# Patient Record
Sex: Female | Born: 1970 | Race: White | Hispanic: No | Marital: Married | Smoking: Never smoker
Health system: Southern US, Community
[De-identification: ages and names within clinical notes are randomized; demographics above are authoritative.]

## PROBLEM LIST (undated history)

## (undated) DIAGNOSIS — M7989 Other specified soft tissue disorders: Secondary | ICD-10-CM

## (undated) DIAGNOSIS — E538 Deficiency of other specified B group vitamins: Secondary | ICD-10-CM

## (undated) DIAGNOSIS — F419 Anxiety disorder, unspecified: Secondary | ICD-10-CM

## (undated) DIAGNOSIS — N905 Atrophy of vulva: Secondary | ICD-10-CM

## (undated) DIAGNOSIS — L732 Hidradenitis suppurativa: Secondary | ICD-10-CM

## (undated) DIAGNOSIS — N83209 Unspecified ovarian cyst, unspecified side: Secondary | ICD-10-CM

## (undated) DIAGNOSIS — K5900 Constipation, unspecified: Secondary | ICD-10-CM

## (undated) DIAGNOSIS — K219 Gastro-esophageal reflux disease without esophagitis: Secondary | ICD-10-CM

## (undated) DIAGNOSIS — G4730 Sleep apnea, unspecified: Secondary | ICD-10-CM

## (undated) DIAGNOSIS — I341 Nonrheumatic mitral (valve) prolapse: Secondary | ICD-10-CM

## (undated) DIAGNOSIS — M5136 Other intervertebral disc degeneration, lumbar region: Secondary | ICD-10-CM

## (undated) DIAGNOSIS — I10 Essential (primary) hypertension: Secondary | ICD-10-CM

## (undated) DIAGNOSIS — F329 Major depressive disorder, single episode, unspecified: Secondary | ICD-10-CM

## (undated) DIAGNOSIS — T4145XA Adverse effect of unspecified anesthetic, initial encounter: Secondary | ICD-10-CM

## (undated) DIAGNOSIS — R112 Nausea with vomiting, unspecified: Secondary | ICD-10-CM

## (undated) DIAGNOSIS — N946 Dysmenorrhea, unspecified: Secondary | ICD-10-CM

## (undated) DIAGNOSIS — K5792 Diverticulitis of intestine, part unspecified, without perforation or abscess without bleeding: Secondary | ICD-10-CM

## (undated) DIAGNOSIS — Z9889 Other specified postprocedural states: Secondary | ICD-10-CM

## (undated) DIAGNOSIS — E669 Obesity, unspecified: Secondary | ICD-10-CM

## (undated) DIAGNOSIS — F32A Depression, unspecified: Secondary | ICD-10-CM

## (undated) DIAGNOSIS — S0300XA Dislocation of jaw, unspecified side, initial encounter: Secondary | ICD-10-CM

## (undated) DIAGNOSIS — K589 Irritable bowel syndrome without diarrhea: Secondary | ICD-10-CM

## (undated) DIAGNOSIS — R141 Gas pain: Secondary | ICD-10-CM

## (undated) DIAGNOSIS — F988 Other specified behavioral and emotional disorders with onset usually occurring in childhood and adolescence: Secondary | ICD-10-CM

## (undated) DIAGNOSIS — N952 Postmenopausal atrophic vaginitis: Secondary | ICD-10-CM

## (undated) DIAGNOSIS — M549 Dorsalgia, unspecified: Secondary | ICD-10-CM

## (undated) DIAGNOSIS — M1990 Unspecified osteoarthritis, unspecified site: Secondary | ICD-10-CM

## (undated) DIAGNOSIS — M51369 Other intervertebral disc degeneration, lumbar region without mention of lumbar back pain or lower extremity pain: Secondary | ICD-10-CM

## (undated) DIAGNOSIS — N809 Endometriosis, unspecified: Secondary | ICD-10-CM

## (undated) DIAGNOSIS — E785 Hyperlipidemia, unspecified: Secondary | ICD-10-CM

## (undated) DIAGNOSIS — R0602 Shortness of breath: Secondary | ICD-10-CM

## (undated) DIAGNOSIS — IMO0002 Reserved for concepts with insufficient information to code with codable children: Secondary | ICD-10-CM

## (undated) DIAGNOSIS — G44229 Chronic tension-type headache, not intractable: Secondary | ICD-10-CM

## (undated) DIAGNOSIS — M2550 Pain in unspecified joint: Secondary | ICD-10-CM

## (undated) DIAGNOSIS — T8859XA Other complications of anesthesia, initial encounter: Secondary | ICD-10-CM

## (undated) HISTORY — DX: Depression, unspecified: F32.A

## (undated) HISTORY — DX: Gastro-esophageal reflux disease without esophagitis: K21.9

## (undated) HISTORY — DX: Gas pain: R14.1

## (undated) HISTORY — DX: Nonrheumatic mitral (valve) prolapse: I34.1

## (undated) HISTORY — DX: Hidradenitis suppurativa: L73.2

## (undated) HISTORY — DX: Obesity, unspecified: E66.9

## (undated) HISTORY — DX: Pain in unspecified joint: M25.50

## (undated) HISTORY — DX: Other specified soft tissue disorders: M79.89

## (undated) HISTORY — DX: Postmenopausal atrophic vaginitis: N95.2

## (undated) HISTORY — DX: Unspecified osteoarthritis, unspecified site: M19.90

## (undated) HISTORY — DX: Deficiency of other specified B group vitamins: E53.8

## (undated) HISTORY — DX: Dislocation of jaw, unspecified side, initial encounter: S03.00XA

## (undated) HISTORY — DX: Unspecified ovarian cyst, unspecified side: N83.209

## (undated) HISTORY — DX: Irritable bowel syndrome, unspecified: K58.9

## (undated) HISTORY — DX: Shortness of breath: R06.02

## (undated) HISTORY — DX: Other specified behavioral and emotional disorders with onset usually occurring in childhood and adolescence: F98.8

## (undated) HISTORY — DX: Anxiety disorder, unspecified: F41.9

## (undated) HISTORY — DX: Chronic tension-type headache, not intractable: G44.229

## (undated) HISTORY — DX: Dysmenorrhea, unspecified: N94.6

## (undated) HISTORY — DX: Endometriosis, unspecified: N80.9

## (undated) HISTORY — DX: Essential (primary) hypertension: I10

## (undated) HISTORY — DX: Dorsalgia, unspecified: M54.9

## (undated) HISTORY — PX: TUBAL LIGATION: SHX77

## (undated) HISTORY — PX: OTHER SURGICAL HISTORY: SHX169

## (undated) HISTORY — PX: CYSTOSCOPY: SUR368

## (undated) HISTORY — DX: Constipation, unspecified: K59.00

## (undated) HISTORY — DX: Major depressive disorder, single episode, unspecified: F32.9

## (undated) HISTORY — DX: Atrophy of vulva: N90.5

## (undated) HISTORY — DX: Hyperlipidemia, unspecified: E78.5

## (undated) HISTORY — DX: Reserved for concepts with insufficient information to code with codable children: IMO0002

---
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
## 1998-11-18 HISTORY — PX: TEMPOROMANDIBULAR JOINT SURGERY: SHX35

## 1999-11-19 HISTORY — PX: OVARIAN CYST REMOVAL: SHX89

## 2001-11-18 HISTORY — PX: HERNIA REPAIR: SHX51

## 2002-11-18 HISTORY — PX: BREAST EXCISIONAL BIOPSY: SUR124

## 2004-06-19 ENCOUNTER — Other Ambulatory Visit: Payer: Self-pay

## 2005-12-23 ENCOUNTER — Ambulatory Visit (HOSPITAL_BASED_OUTPATIENT_CLINIC_OR_DEPARTMENT_OTHER): Admission: RE | Admit: 2005-12-23 | Discharge: 2005-12-23 | Payer: Self-pay | Admitting: Urology

## 2006-04-10 ENCOUNTER — Ambulatory Visit: Payer: Self-pay

## 2006-06-11 ENCOUNTER — Ambulatory Visit: Payer: Self-pay

## 2007-03-10 ENCOUNTER — Ambulatory Visit: Payer: Self-pay | Admitting: Obstetrics and Gynecology

## 2007-03-27 ENCOUNTER — Inpatient Hospital Stay: Payer: Self-pay | Admitting: Obstetrics and Gynecology

## 2007-03-27 HISTORY — PX: ABDOMINAL HYSTERECTOMY: SHX81

## 2008-02-26 ENCOUNTER — Emergency Department: Payer: Self-pay | Admitting: Emergency Medicine

## 2008-03-02 ENCOUNTER — Emergency Department: Payer: Self-pay | Admitting: Emergency Medicine

## 2009-02-27 ENCOUNTER — Ambulatory Visit: Payer: Self-pay | Admitting: Internal Medicine

## 2009-04-24 ENCOUNTER — Ambulatory Visit: Payer: Self-pay | Admitting: Internal Medicine

## 2009-07-06 ENCOUNTER — Ambulatory Visit: Payer: Self-pay | Admitting: Internal Medicine

## 2009-08-17 ENCOUNTER — Ambulatory Visit: Payer: Self-pay | Admitting: Unknown Physician Specialty

## 2009-08-29 ENCOUNTER — Ambulatory Visit: Payer: Self-pay | Admitting: Family Medicine

## 2009-09-08 ENCOUNTER — Emergency Department: Payer: Self-pay | Admitting: Emergency Medicine

## 2009-09-26 ENCOUNTER — Ambulatory Visit: Payer: Self-pay | Admitting: General Practice

## 2009-11-07 ENCOUNTER — Emergency Department: Payer: Self-pay | Admitting: Emergency Medicine

## 2010-03-13 ENCOUNTER — Ambulatory Visit: Payer: Self-pay | Admitting: Anesthesiology

## 2010-03-27 ENCOUNTER — Ambulatory Visit: Payer: Self-pay | Admitting: Anesthesiology

## 2010-04-13 ENCOUNTER — Ambulatory Visit: Payer: Self-pay | Admitting: Anesthesiology

## 2010-04-19 ENCOUNTER — Ambulatory Visit: Payer: Self-pay | Admitting: Unknown Physician Specialty

## 2010-05-01 ENCOUNTER — Inpatient Hospital Stay: Payer: Self-pay | Admitting: Unknown Physician Specialty

## 2010-05-18 ENCOUNTER — Ambulatory Visit: Payer: Self-pay | Admitting: Unknown Physician Specialty

## 2010-05-25 ENCOUNTER — Ambulatory Visit: Payer: Self-pay | Admitting: Anesthesiology

## 2010-06-08 ENCOUNTER — Ambulatory Visit: Payer: Self-pay | Admitting: Unknown Physician Specialty

## 2010-06-29 ENCOUNTER — Ambulatory Visit: Payer: Self-pay | Admitting: Anesthesiology

## 2010-07-20 ENCOUNTER — Ambulatory Visit: Payer: Self-pay | Admitting: Anesthesiology

## 2010-07-26 ENCOUNTER — Ambulatory Visit: Payer: Self-pay

## 2010-08-20 ENCOUNTER — Encounter: Payer: Self-pay | Admitting: General Practice

## 2010-08-31 ENCOUNTER — Ambulatory Visit: Payer: Self-pay | Admitting: General Practice

## 2010-09-02 ENCOUNTER — Inpatient Hospital Stay: Payer: Self-pay | Admitting: Internal Medicine

## 2010-09-07 ENCOUNTER — Ambulatory Visit: Payer: Self-pay | Admitting: Anesthesiology

## 2010-09-18 ENCOUNTER — Encounter: Payer: Self-pay | Admitting: General Practice

## 2010-10-03 ENCOUNTER — Ambulatory Visit: Payer: Self-pay | Admitting: Anesthesiology

## 2010-10-18 ENCOUNTER — Encounter: Payer: Self-pay | Admitting: General Practice

## 2010-10-30 ENCOUNTER — Ambulatory Visit: Payer: Self-pay | Admitting: Anesthesiology

## 2010-11-18 ENCOUNTER — Encounter: Payer: Self-pay | Admitting: General Practice

## 2010-11-28 ENCOUNTER — Ambulatory Visit: Payer: Self-pay

## 2010-12-03 ENCOUNTER — Ambulatory Visit: Payer: Self-pay | Admitting: Unknown Physician Specialty

## 2010-12-28 ENCOUNTER — Ambulatory Visit: Payer: Self-pay | Admitting: Anesthesiology

## 2011-03-04 ENCOUNTER — Ambulatory Visit: Payer: Self-pay | Admitting: Anesthesiology

## 2011-04-23 ENCOUNTER — Ambulatory Visit: Payer: Self-pay | Admitting: Anesthesiology

## 2011-06-25 ENCOUNTER — Ambulatory Visit: Payer: Self-pay | Admitting: Anesthesiology

## 2011-09-25 ENCOUNTER — Ambulatory Visit: Payer: Self-pay | Admitting: Anesthesiology

## 2012-03-18 ENCOUNTER — Inpatient Hospital Stay: Payer: Self-pay | Admitting: Internal Medicine

## 2012-03-18 LAB — COMPREHENSIVE METABOLIC PANEL
Anion Gap: 5 — ABNORMAL LOW (ref 7–16)
BUN: 10 mg/dL (ref 7–18)
Bilirubin,Total: 0.3 mg/dL (ref 0.2–1.0)
Chloride: 106 mmol/L (ref 98–107)
Co2: 26 mmol/L (ref 21–32)
EGFR (African American): >60
Potassium: 4.1 mmol/L (ref 3.5–5.1)
SGOT(AST): 26 U/L (ref 15–37)
SGPT (ALT): 37 U/L
Sodium: 137 mmol/L (ref 136–145)
Total Protein: 7.8 g/dL (ref 6.4–8.2)

## 2012-03-18 LAB — URINALYSIS, COMPLETE
RBC,UR: <1 /HPF (ref 0–5)
Squamous Epithelial: 2
WBC UR: <1 /HPF (ref 0–5)

## 2012-03-18 LAB — CBC
MCH: 29.7 pg (ref 26.0–34.0)
MCV: 88 fL (ref 80–100)
Platelet: 270 10*3/uL (ref 150–440)
RBC: 4.77 10*6/uL (ref 3.80–5.20)
RDW: 14.7 % — ABNORMAL HIGH (ref 11.5–14.5)
WBC: 11.2 10*3/uL — ABNORMAL HIGH (ref 3.6–11.0)

## 2012-03-18 LAB — LIPASE, BLOOD: Lipase: 188 U/L (ref 73–393)

## 2012-03-19 LAB — CBC WITH DIFFERENTIAL/PLATELET
Basophil #: 0 10*3/uL (ref 0.0–0.1)
Basophil %: 0.3 %
Eosinophil #: 0.1 10*3/uL (ref 0.0–0.7)
Eosinophil %: 1.1 %
Lymphocyte #: 1.9 10*3/uL (ref 1.0–3.6)
Lymphocyte %: 26.7 %
MCV: 89 fL (ref 80–100)
Monocyte #: 0.5 x10 3/mm (ref 0.2–0.9)
Monocyte %: 7.3 %
Neutrophil %: 64.6 %
Platelet: 214 10*3/uL (ref 150–440)
RBC: 4.07 10*6/uL (ref 3.80–5.20)
RDW: 14.5 % (ref 11.5–14.5)

## 2012-03-19 LAB — COMPREHENSIVE METABOLIC PANEL
Alkaline Phosphatase: 102 U/L (ref 50–136)
BUN: 6 mg/dL — ABNORMAL LOW (ref 7–18)
Bilirubin,Total: 0.6 mg/dL (ref 0.2–1.0)
EGFR (African American): >60
Glucose: 90 mg/dL (ref 65–99)
Osmolality: 275 (ref 275–301)
Potassium: 3.6 mmol/L (ref 3.5–5.1)
SGPT (ALT): 28 U/L
Total Protein: 6.8 g/dL (ref 6.4–8.2)

## 2012-03-19 LAB — LIPID PANEL
Cholesterol: 235 mg/dL — ABNORMAL HIGH (ref 0–200)
HDL Cholesterol: 35 mg/dL — ABNORMAL LOW (ref 40–60)
Ldl Cholesterol, Calc: 148 mg/dL — ABNORMAL HIGH (ref 0–100)
Triglycerides: 258 mg/dL — ABNORMAL HIGH (ref 0–200)

## 2012-03-20 LAB — CBC WITH DIFFERENTIAL/PLATELET
Basophil #: 0 10*3/uL (ref 0.0–0.1)
Eosinophil #: 0.1 10*3/uL (ref 0.0–0.7)
HCT: 36.1 % (ref 35.0–47.0)
Lymphocyte #: 1.5 10*3/uL (ref 1.0–3.6)
Lymphocyte %: 27.9 %
Neutrophil %: 63.2 %
Platelet: 203 10*3/uL (ref 150–440)
RBC: 4.05 10*6/uL (ref 3.80–5.20)
RDW: 14.6 % — ABNORMAL HIGH (ref 11.5–14.5)

## 2012-03-20 LAB — BASIC METABOLIC PANEL
Anion Gap: 6 — ABNORMAL LOW (ref 7–16)
BUN: 4 mg/dL — ABNORMAL LOW (ref 7–18)
Calcium, Total: 8.5 mg/dL (ref 8.5–10.1)
Chloride: 105 mmol/L (ref 98–107)
Co2: 28 mmol/L (ref 21–32)
Creatinine: 0.86 mg/dL (ref 0.60–1.30)
EGFR (African American): >60
EGFR (Non-African Amer.): >60
Osmolality: 273 (ref 275–301)
Potassium: 3.4 mmol/L — ABNORMAL LOW (ref 3.5–5.1)
Sodium: 139 mmol/L (ref 136–145)

## 2012-03-21 LAB — BASIC METABOLIC PANEL
BUN: 3 mg/dL — ABNORMAL LOW (ref 7–18)
Chloride: 109 mmol/L — ABNORMAL HIGH (ref 98–107)
Creatinine: 0.76 mg/dL (ref 0.60–1.30)
EGFR (African American): >60
Glucose: 95 mg/dL (ref 65–99)
Osmolality: 283 (ref 275–301)
Potassium: 3.4 mmol/L — ABNORMAL LOW (ref 3.5–5.1)

## 2012-03-21 LAB — CBC WITH DIFFERENTIAL/PLATELET
Basophil #: 0 10*3/uL (ref 0.0–0.1)
Basophil %: 0.4 %
Eosinophil %: 3.1 %
HCT: 33.7 % — ABNORMAL LOW (ref 35.0–47.0)
Lymphocyte #: 1.2 10*3/uL (ref 1.0–3.6)
Lymphocyte %: 27.9 %
MCHC: 33.9 g/dL (ref 32.0–36.0)
MCV: 89 fL (ref 80–100)
Monocyte %: 7.9 %
Neutrophil #: 2.5 10*3/uL (ref 1.4–6.5)

## 2012-04-01 ENCOUNTER — Ambulatory Visit: Payer: Self-pay | Admitting: Physical Medicine and Rehabilitation

## 2012-04-02 ENCOUNTER — Ambulatory Visit: Payer: Self-pay | Admitting: Gastroenterology

## 2012-04-08 LAB — HEPATIC FUNCTION PANEL
Alkaline Phosphatase: 88 U/L (ref 25–125)
Bilirubin, Direct: 0.1 mg/dL

## 2012-04-08 LAB — LIPID PANEL
HDL: 38 mg/dL (ref 35–70)
LDl/HDL Ratio: 7.8
Triglycerides: 230 mg/dL — AB (ref 40–160)

## 2012-04-08 LAB — BASIC METABOLIC PANEL
BUN: 10 mg/dL (ref 4–21)
Sodium: 139 mmol/L (ref 137–147)

## 2012-04-08 LAB — CBC AND DIFFERENTIAL
Hemoglobin: 13.3 g/dL (ref 12.0–16.0)
WBC: 4.5 10^3/mL

## 2012-05-13 ENCOUNTER — Ambulatory Visit: Payer: Self-pay | Admitting: Physical Medicine and Rehabilitation

## 2012-07-14 ENCOUNTER — Encounter: Payer: Self-pay | Admitting: Physical Medicine and Rehabilitation

## 2012-12-08 ENCOUNTER — Encounter: Payer: Self-pay | Admitting: Internal Medicine

## 2012-12-08 ENCOUNTER — Encounter: Payer: Self-pay | Admitting: *Deleted

## 2012-12-08 ENCOUNTER — Ambulatory Visit (INDEPENDENT_AMBULATORY_CARE_PROVIDER_SITE_OTHER): Payer: Self-pay | Admitting: Internal Medicine

## 2012-12-08 VITALS — BP 130/78 | HR 82 | Temp 98.9°F | Ht 70.5 in | Wt 280.0 lb

## 2012-12-08 DIAGNOSIS — R5381 Other malaise: Secondary | ICD-10-CM

## 2012-12-08 DIAGNOSIS — G8929 Other chronic pain: Secondary | ICD-10-CM

## 2012-12-08 DIAGNOSIS — G4730 Sleep apnea, unspecified: Secondary | ICD-10-CM

## 2012-12-08 DIAGNOSIS — M549 Dorsalgia, unspecified: Secondary | ICD-10-CM

## 2012-12-08 DIAGNOSIS — E7800 Pure hypercholesterolemia, unspecified: Secondary | ICD-10-CM

## 2012-12-08 DIAGNOSIS — M79609 Pain in unspecified limb: Secondary | ICD-10-CM

## 2012-12-08 DIAGNOSIS — M79606 Pain in leg, unspecified: Secondary | ICD-10-CM

## 2012-12-08 DIAGNOSIS — I341 Nonrheumatic mitral (valve) prolapse: Secondary | ICD-10-CM

## 2012-12-08 DIAGNOSIS — R5383 Other fatigue: Secondary | ICD-10-CM

## 2012-12-08 DIAGNOSIS — R51 Headache: Secondary | ICD-10-CM

## 2012-12-08 DIAGNOSIS — I059 Rheumatic mitral valve disease, unspecified: Secondary | ICD-10-CM

## 2012-12-08 MED ORDER — ESTRADIOL 1 MG PO TABS: 1.0000 mg | ORAL_TABLET | Freq: Every day | ORAL | Status: DC

## 2012-12-13 ENCOUNTER — Encounter: Payer: Self-pay | Admitting: Internal Medicine

## 2012-12-13 DIAGNOSIS — G4730 Sleep apnea, unspecified: Secondary | ICD-10-CM | POA: Insufficient documentation

## 2012-12-13 DIAGNOSIS — I341 Nonrheumatic mitral (valve) prolapse: Secondary | ICD-10-CM | POA: Insufficient documentation

## 2012-12-13 DIAGNOSIS — M549 Dorsalgia, unspecified: Secondary | ICD-10-CM | POA: Insufficient documentation

## 2012-12-13 DIAGNOSIS — E7800 Pure hypercholesterolemia, unspecified: Secondary | ICD-10-CM | POA: Insufficient documentation

## 2012-12-13 DIAGNOSIS — G8929 Other chronic pain: Secondary | ICD-10-CM

## 2012-12-13 HISTORY — DX: Other chronic pain: G89.29

## 2012-12-13 NOTE — Assessment & Plan Note (Signed)
See above for results.  Never had the CPAP titration.  Encouraged her to schedule.  She is not able at this time.  Instructed to avoid sleeping supine and avoid sedating medications.

## 2012-12-13 NOTE — Assessment & Plan Note (Signed)
Saw Dr Juel Burrow.  Had ECHO - MVP.  Currently doing well.  Follow.

## 2012-12-13 NOTE — Assessment & Plan Note (Signed)
Has a documented history of chronic headaches.  Has a history of a chiari malformation.  Is s/p chiari decompression.  Followed by neurosurgery.  MRI 01/01/11.  Declines any further testing at this time.  Follow.

## 2012-12-13 NOTE — Assessment & Plan Note (Signed)
Not taking her medication regularly.  Not watching what she eats.  Check lipid panel.

## 2012-12-13 NOTE — Assessment & Plan Note (Signed)
Being followed by pain clinic.  Gets her pain meds through them.  Undergoing further w/up.  See above.

## 2012-12-13 NOTE — Progress Notes (Signed)
Subjective:    Patient ID: Kathleen Everett, female    DOB: 05-05-1971, 42 y.o.   MRN: [id number]  HPI 42 year old female with past history of allergy problems, bladder ulcers with previous urological w/up requiring pubovaginal sling, depression, hypercholesterolemia and s/p chiari decompression.  She comes in today for a scheduled follow up.  She was diagnosed with sleep apnea.  Had both obstructive and central sleep apnea.  Had a redo test off pain medication and the central apnea resolved.  She never followed through with the CPAP titration.  States she just can't afford to.  Has follow up with surgery 12/13.  Having a discogram.  May require fusion.  She is taking Flexeril.  She has noticed some tingling in her legs - bilateral.  Lower legs to ankle.  No weakness.  Some increased fatigue.    Past Medical History  Diagnosis Date  . Hyperlipidemia   . Chronic tension headaches   . Dysmenorrhea   . Hidradenitis   . Endometriosis   . Depression   . Osteoarthritis   . Obesity   . Ovarian cyst     hx  . GERD (gastroesophageal reflux disease)     Current Outpatient Prescriptions on File Prior to Visit  Medication Sig Dispense Refill  . buPROPion (WELLBUTRIN SR) 200 MG 12 hr tablet Take 200 mg by mouth daily.       Marland Kitchen estradiol (ESTRACE) 1 MG tablet Take 1 tablet (1 mg total) by mouth daily.  30 tablet  2  . omeprazole (PRILOSEC OTC) 20 MG tablet Take 20 mg by mouth 2 (two) times daily.        Review of Systems Patient denies any lightheadedness or dizziness.  Some intermittent headache.  She declines further w/up for this.  No chest pain, tightness or palpitations.  No increased shortness of breath, cough or congestion.  Feels breathing is stable.  No nausea or vomiting.  No abdominal pain or cramping.  No bowel change, such as diarrhea, constipation, BRBPR or melana.  No urine change.        Objective:   Physical Exam  Filed Vitals:   12/08/12 1033  BP: 130/78  Pulse: 82  Temp:  98.9 F (37.47 C)     42 year old female in no acute distress.   HEENT:  Nares- clear.  Oropharynx - without lesions. NECK:  Supple.  Nontender.  No audible bruit.  HEART:  Appears to be regular. LUNGS:  No crackles or wheezing audible.  Respirations even and unlabored.  RADIAL PULSE:  Equal bilaterally.  ABDOMEN:  Soft, nontender.  Bowel sounds present and normal.  No audible abdominal bruit.   EXTREMITIES:  No increased edema present.  DP pulses palpable and equal bilaterally.       NEURO:  No focal weakness found.   No significant decreased to sensation.      Assessment & Plan:  PSYCH.  Has been on wellbutrin.  Was seeing Dr Lenis Noon.  Was referred to Dr Janeece Riggers.  Not seeing anyone now.  She feels things are stable.  Follow.    GI.  Has seen GI.  Had extensive w/up.  Stable.  Follow.   GYN.  S/p BSO.  Was seeing Dr Harold Hedge.  Stable.  Follow.    PREVIOUS DOCUMENTED ELEVATED BLOOD PRESSURE.  Blood pressure doing well.  Follow.   FATIGUE.  Check cbc, met c and tsh.   BREAST NODULE.  Mammogram 1/12 revealed a smoothly marginated nodule.  Mammogram read as a BiRADS II.  Recommended yearly follow up.  Had discussed with her regarding further w/up.  She declined.  Follow.  Will need to schedule a follow up mammogram if not done.    HEALTH MAINTENANCE.  Physical 04/06/12.  Mammogram as outlined.

## 2012-12-16 ENCOUNTER — Other Ambulatory Visit: Payer: Self-pay

## 2012-12-18 ENCOUNTER — Other Ambulatory Visit: Payer: Self-pay

## 2012-12-21 ENCOUNTER — Other Ambulatory Visit (INDEPENDENT_AMBULATORY_CARE_PROVIDER_SITE_OTHER): Payer: Self-pay

## 2012-12-21 DIAGNOSIS — M79609 Pain in unspecified limb: Secondary | ICD-10-CM

## 2012-12-21 DIAGNOSIS — E7800 Pure hypercholesterolemia, unspecified: Secondary | ICD-10-CM

## 2012-12-21 DIAGNOSIS — R5381 Other malaise: Secondary | ICD-10-CM

## 2012-12-21 DIAGNOSIS — R5383 Other fatigue: Secondary | ICD-10-CM

## 2012-12-21 DIAGNOSIS — M79606 Pain in leg, unspecified: Secondary | ICD-10-CM

## 2012-12-21 LAB — CBC WITH DIFFERENTIAL/PLATELET
Basophils Absolute: 0 10*3/uL (ref 0.0–0.1)
Basophils Relative: 0.3 % (ref 0.0–3.0)
Hemoglobin: 13 g/dL (ref 12.0–15.0)
Lymphocytes Relative: 23.3 % (ref 12.0–46.0)
Monocytes Relative: 6.1 % (ref 3.0–12.0)
Neutro Abs: 4.6 10*3/uL (ref 1.4–7.7)
RBC: 4.44 Mil/uL (ref 3.87–5.11)

## 2012-12-21 LAB — COMPREHENSIVE METABOLIC PANEL
ALT: 23 U/L (ref 0–35)
BUN: 12 mg/dL (ref 6–23)
CO2: 27 mEq/L (ref 19–32)
Calcium: 9.1 mg/dL (ref 8.4–10.5)
Chloride: 107 mEq/L (ref 96–112)
Creatinine, Ser: 0.8 mg/dL (ref 0.4–1.2)
GFR: 91.2 mL/min (ref >60.00)

## 2012-12-21 LAB — VITAMIN B12: Vitamin B-12: 149 pg/mL — ABNORMAL LOW (ref 211–911)

## 2012-12-21 LAB — LIPID PANEL: Triglycerides: 224 mg/dL — ABNORMAL HIGH (ref 0.0–149.0)

## 2012-12-21 LAB — LDL CHOLESTEROL, DIRECT: Direct LDL: 206.3 mg/dL

## 2012-12-23 ENCOUNTER — Telehealth: Payer: Self-pay | Admitting: Internal Medicine

## 2012-12-23 MED ORDER — SIMVASTATIN 20 MG PO TABS: 20.0000 mg | ORAL_TABLET | Freq: Every day | ORAL | Status: DC

## 2012-12-23 NOTE — Telephone Encounter (Signed)
Pt notified of lab results and need to start b12 injections and to start simvastatin 20mg  q day.  Directions for b12 wriitten out on med list.

## 2012-12-25 ENCOUNTER — Telehealth: Payer: Self-pay | Admitting: Internal Medicine

## 2012-12-25 NOTE — Telephone Encounter (Signed)
Patient wants to know what she should do while the B-12 is on back order.

## 2012-12-25 NOTE — Telephone Encounter (Signed)
Spoke patient and informed that I will call her when b-12 comes in.

## 2012-12-25 NOTE — Telephone Encounter (Signed)
Will see if we get in next week.  Can give then

## 2013-01-28 ENCOUNTER — Telehealth: Payer: Self-pay | Admitting: *Deleted

## 2013-01-28 NOTE — Telephone Encounter (Signed)
Patient left message on voicemail stating she would like someone to call her back in reference to getting a referral letter to Dr. Pernell Dupre

## 2013-01-29 NOTE — Telephone Encounter (Signed)
Patient wants to see Dr Pernell Dupre again about her back problems. Patient would like a referral. Her workmans comp has told her that she will need a referral letter.

## 2013-01-31 NOTE — Telephone Encounter (Signed)
I have placed order for referral to pain clinic - Dr Pernell Dupre.  Amber should be calling her with an appt.

## 2013-02-14 ENCOUNTER — Telehealth: Payer: Self-pay | Admitting: Internal Medicine

## 2013-02-14 DIAGNOSIS — M549 Dorsalgia, unspecified: Secondary | ICD-10-CM

## 2013-02-14 NOTE — Telephone Encounter (Signed)
[facility] pain clinic does not take self pay patients.  I made referral to Dr Yves Dill.

## 2013-02-16 ENCOUNTER — Ambulatory Visit (INDEPENDENT_AMBULATORY_CARE_PROVIDER_SITE_OTHER): Payer: Self-pay | Admitting: *Deleted

## 2013-02-16 DIAGNOSIS — E538 Deficiency of other specified B group vitamins: Secondary | ICD-10-CM

## 2013-02-16 MED ORDER — CYANOCOBALAMIN 1000 MCG/ML IJ SOLN
1000.0000 ug | Freq: Once | INTRAMUSCULAR | Status: AC
  Administered 2013-02-16: 1000 ug via INTRAMUSCULAR

## 2013-02-23 ENCOUNTER — Ambulatory Visit (INDEPENDENT_AMBULATORY_CARE_PROVIDER_SITE_OTHER): Payer: Self-pay | Admitting: *Deleted

## 2013-02-23 DIAGNOSIS — E538 Deficiency of other specified B group vitamins: Secondary | ICD-10-CM

## 2013-02-23 MED ORDER — CYANOCOBALAMIN 1000 MCG/ML IJ SOLN
1000.0000 ug | Freq: Once | INTRAMUSCULAR | Status: AC
  Administered 2013-02-23: 1000 ug via INTRAMUSCULAR

## 2013-02-25 ENCOUNTER — Telehealth: Payer: Self-pay | Admitting: *Deleted

## 2013-02-25 NOTE — Telephone Encounter (Signed)
Patient was in for b-12 injection on Tuesday. She wants to know if she can start getting b-12 prescription so she can give herself the injections at home? Patient said the reason why is because she is not working right now and she can not afford to continue getting injections here. Patient demonstrated that she can give self injections.

## 2013-02-26 NOTE — Telephone Encounter (Signed)
I am ok with her giving herself injections.  She is to start 1000 mcg q month.  Tell her she is not due for another B12 until around 03/25/13 - then she is to continue B12 injections q month.  rx written.

## 2013-03-02 NOTE — Telephone Encounter (Signed)
Patient came in and picked up prescription for her B12 with syringes.

## 2013-04-09 ENCOUNTER — Ambulatory Visit (INDEPENDENT_AMBULATORY_CARE_PROVIDER_SITE_OTHER): Payer: Self-pay | Admitting: Internal Medicine

## 2013-04-09 ENCOUNTER — Encounter: Payer: Self-pay | Admitting: Internal Medicine

## 2013-04-09 ENCOUNTER — Other Ambulatory Visit (HOSPITAL_COMMUNITY)
Admission: RE | Admit: 2013-04-09 | Discharge: 2013-04-09 | Disposition: A | Discharge status: Home / Self Care | Payer: Self-pay | Source: Ambulatory Visit | Attending: Internal Medicine | Admitting: Internal Medicine

## 2013-04-09 VITALS — BP 110/80 | HR 99 | Temp 98.8°F | Ht 70.0 in | Wt 273.0 lb

## 2013-04-09 DIAGNOSIS — G4730 Sleep apnea, unspecified: Secondary | ICD-10-CM

## 2013-04-09 DIAGNOSIS — I341 Nonrheumatic mitral (valve) prolapse: Secondary | ICD-10-CM

## 2013-04-09 DIAGNOSIS — Z124 Encounter for screening for malignant neoplasm of cervix: Secondary | ICD-10-CM

## 2013-04-09 DIAGNOSIS — I059 Rheumatic mitral valve disease, unspecified: Secondary | ICD-10-CM

## 2013-04-09 DIAGNOSIS — R51 Headache: Secondary | ICD-10-CM

## 2013-04-09 DIAGNOSIS — M25551 Pain in right hip: Secondary | ICD-10-CM

## 2013-04-09 DIAGNOSIS — E7800 Pure hypercholesterolemia, unspecified: Secondary | ICD-10-CM

## 2013-04-09 DIAGNOSIS — Z01419 Encounter for gynecological examination (general) (routine) without abnormal findings: Secondary | ICD-10-CM | POA: Insufficient documentation

## 2013-04-09 DIAGNOSIS — M549 Dorsalgia, unspecified: Secondary | ICD-10-CM

## 2013-04-09 DIAGNOSIS — G8929 Other chronic pain: Secondary | ICD-10-CM

## 2013-04-09 DIAGNOSIS — Z1151 Encounter for screening for human papillomavirus (HPV): Secondary | ICD-10-CM | POA: Insufficient documentation

## 2013-04-09 DIAGNOSIS — M25559 Pain in unspecified hip: Secondary | ICD-10-CM

## 2013-04-09 MED ORDER — SIMVASTATIN 20 MG PO TABS: 20.0000 mg | ORAL_TABLET | Freq: Every day | ORAL | Status: DC

## 2013-04-09 MED ORDER — CYCLOBENZAPRINE HCL 5 MG PO TABS: 5.0000 mg | ORAL_TABLET | Freq: Every day | ORAL | Status: DC | PRN

## 2013-04-11 ENCOUNTER — Encounter: Payer: Self-pay | Admitting: Internal Medicine

## 2013-04-11 NOTE — Assessment & Plan Note (Signed)
Saw Dr Juel Burrow.  Had ECHO - MVP.  Currently doing well.  Follow.

## 2013-04-11 NOTE — Assessment & Plan Note (Signed)
Never had the CPAP titration.  Encouraged her to schedule.  She is not able at this time.  Instructed to avoid sleeping supine and avoid sedating medications.

## 2013-04-11 NOTE — Assessment & Plan Note (Signed)
Has a documented history of chronic headaches.  Has a history of a chiari malformation.  Is s/p chiari decompression.  Followed by neurosurgery.  MRI 01/01/11.  Has declined any further testing at this time.  Follow.  No headache reported today.  Follow.

## 2013-04-11 NOTE — Assessment & Plan Note (Signed)
Settled her workman's comp.  No longer seeing anyone for her back now.  Would like to follow with Dr Pernell Dupre.  She called to get an appt and was told they do not see self pay pts(per pt).  She is having the right hip pain as outlined.  Feels different from her chronic back pain/issues.  Request referral to Dr Gavin Potters for evaluation and possible injection.  Has tramadol and flexeril.

## 2013-04-11 NOTE — Assessment & Plan Note (Signed)
On simvastatin now.  Tolerating.  Low cholesterol diet and exercise.  Check lipid panel and liver function.

## 2013-04-11 NOTE — Progress Notes (Signed)
Subjective:    Patient ID: Kathleen Everett, female    DOB: 17-May-1971, 42 y.o.   MRN: [id number]  HPI 42 year old female with past history of allergy problems, bladder ulcers with previous urological w/up requiring pubovaginal sling, depression, hypercholesterolemia and s/p chiari decompression.  She comes in today to f/u on these issues as well as for a complete physical exam.   She was diagnosed with sleep apnea.  Had both obstructive and central sleep apnea.  Had a redo test off pain medication and the central apnea resolved.  She never followed through with the CPAP titration.  Discussed again with her today.   States she just can't afford to have this done.  Discussed the need to avoid sleeping supine and avoid sedating medications.   She is taking Flexeril.  Feels she needs to help her function.  No weakness.  Some increased fatigue.  Still with the back pain.  Has settled her workman's comp.  No longer following with anyone for her back.  She would like to see Dr Pernell Dupre again, but states he does not take self pay pts.  She is having some increased pain in her right hip.  Feels she would benefit from an injection in her hip.  States this feels different from her chronic back pain.  Request to see Dr Gavin Potters again.   She has started walking.  Seeing [facility].  Started [facility].  Trying to lose weight.  Saw Dr Yves Dill.  Gave her tramadol.  States she is unable to work secondary to the increased pain and decreased function.  She is contemplating trying to get disability.     Past Medical History  Diagnosis Date  . Hyperlipidemia   . Chronic tension headaches   . Dysmenorrhea   . Hidradenitis   . Endometriosis   . Depression   . Osteoarthritis   . Obesity   . Ovarian cyst     hx  . GERD (gastroesophageal reflux disease)     Current Outpatient Prescriptions on File Prior to Visit  Medication Sig Dispense Refill  . acetaminophen (TYLENOL) 500 MG tablet Take 500 mg by mouth 2  (two) times daily.      Marland Kitchen buPROPion (WELLBUTRIN SR) 200 MG 12 hr tablet Take 200 mg by mouth daily.       . cyanocobalamin (,VITAMIN B-12,) 1000 MCG/ML injection Give q week x 4 weeks and then q month.      . estradiol (ESTRACE) 1 MG tablet Take 1 tablet (1 mg total) by mouth daily.  30 tablet  2  . ibuprofen (ADVIL,MOTRIN) 200 MG tablet Take 200 mg by mouth every 6 (six) hours as needed.      . lidocaine (LIDODERM) 5 % Place 1 patch onto the skin daily. Remove & Discard patch within 12 hours or as directed by MD      . omeprazole (PRILOSEC OTC) 20 MG tablet Take 20 mg by mouth 2 (two) times daily.      Marland Kitchen oxyCODONE-acetaminophen (PERCOCET) 7.5-500 MG per tablet Take 1 tablet by mouth 3 (three) times daily.       No current facility-administered medications on file prior to visit.    Review of Systems Patient denies any lightheadedness or dizziness.  No headaches reported today.  No chest pain, tightness or palpitations.  No increased shortness of breath, cough or congestion.  Feels breathing is stable.  No nausea or vomiting.  No abdominal pain or cramping.  No  bowel change, such as diarrhea, constipation, BRBPR or melana.  No urine change.  Has chronic back pain as outlined.  Limits her mobility and function.  Trying to lose weight and exercise now.  Is having some right hip pain.  Feels this is different from her chronic back pain.  Request referral back to Dr Gavin Potters for evaluation and question of need of injection in her hip.  Seeing [facility].  Has helped.  Previously saw Dr Yves Dill.       Objective:   Physical Exam  Filed Vitals:   04/09/13 0822  BP: 110/80  Pulse: 99  Temp: 98.8 F (37.1 C)   Blood pressure recheck:  106/72, pulse 85  42 year old female in no acute distress.   HEENT:  Nares- clear.  Oropharynx - without lesions. NECK:  Supple.  Nontender.  No audible bruit.  HEART:  Appears to be regular. LUNGS:  No crackles or wheezing audible.   Respirations even and unlabored.  RADIAL PULSE:  Equal bilaterally.    BREASTS:  No nipple discharge or nipple retraction present.  Could not appreciate any distinct nodules or axillary adenopathy.  ABDOMEN:  Soft, nontender.  Bowel sounds present and normal.  No audible abdominal bruit.  GU:  Normal external genitalia.  Vaginal vault without lesions.  Cervix identified.  Pap performed. Could not appreciate any adnexal masses or tenderness.   RECTAL:  Heme negative.   EXTREMITIES:  No increased edema present.  DP pulses palpable and equal bilaterally.  MSK:  Some increased discomfort in her right hip and lower back with straight leg raise.  Some increased pain with getting on and off the exam table - more localized to the lower back.          Assessment & Plan:  PSYCH.  Has been on wellbutrin.  Was seeing Dr Lenis Noon.  Was referred to Dr Janeece Riggers.  Not seeing anyone now.  She feels things are stable.  Follow.    GI.  Has seen GI.  Had extensive w/up.  Stable.  Follow.   GYN.  S/p BSO.  Was seeing Dr Harold Hedge.  Stable.  Follow.  Pelvic and pap today.   PREVIOUS DOCUMENTED ELEVATED BLOOD PRESSURE.  Blood pressure doing well.  On no medication.  Follow.   BREAST NODULE.  Mammogram 1/12 revealed a smoothly marginated nodule.  Mammogram read as a BiRADS II.  Recommended yearly follow up.  Had discussed with her regarding further w/up.  She declined.  Follow.  Discussed the need for mammogram.  [facility] get [facility] the [facility].  She will have to schedule.   HEALTH MAINTENANCE.  Physical today.  Mammogram as outlined.

## 2013-05-05 ENCOUNTER — Other Ambulatory Visit: Payer: Self-pay

## 2013-05-11 ENCOUNTER — Telehealth: Payer: Self-pay | Admitting: Internal Medicine

## 2013-05-11 NOTE — Telephone Encounter (Signed)
traMADol (ULTRAM) 50 MG tablet  The patient has been taking 4-5 pills a day

## 2013-05-13 NOTE — Telephone Encounter (Signed)
Okay to refill? 

## 2013-05-13 NOTE — Telephone Encounter (Signed)
Was given to her by Dr Yves Dill.  If still seeing him, needs to get refilled by him.  Thanks.

## 2013-05-13 NOTE — Telephone Encounter (Signed)
Pt states that she is no longer seeing Dr. Yves Dill

## 2013-05-14 ENCOUNTER — Other Ambulatory Visit: Payer: Self-pay | Admitting: *Deleted

## 2013-05-14 MED ORDER — TRAMADOL HCL 50 MG PO TABS: 50.0000 mg | ORAL_TABLET | Freq: Four times a day (QID) | ORAL | Status: DC | PRN

## 2013-05-14 NOTE — Telephone Encounter (Signed)
Ok to refill ultram 50mg  - take one qid prn (#120) with one refill.

## 2013-05-14 NOTE — Telephone Encounter (Signed)
done

## 2013-05-24 ENCOUNTER — Other Ambulatory Visit (INDEPENDENT_AMBULATORY_CARE_PROVIDER_SITE_OTHER): Payer: Self-pay

## 2013-05-24 ENCOUNTER — Telehealth: Payer: Self-pay | Admitting: *Deleted

## 2013-05-24 DIAGNOSIS — M549 Dorsalgia, unspecified: Secondary | ICD-10-CM

## 2013-05-24 DIAGNOSIS — G8929 Other chronic pain: Secondary | ICD-10-CM

## 2013-05-24 DIAGNOSIS — E7800 Pure hypercholesterolemia, unspecified: Secondary | ICD-10-CM

## 2013-05-24 LAB — BASIC METABOLIC PANEL
BUN: 12 mg/dL (ref 6–23)
CO2: 29 mEq/L (ref 19–32)
Chloride: 105 mEq/L (ref 96–112)
Glucose, Bld: 92 mg/dL (ref 70–99)
Potassium: 4.2 mEq/L (ref 3.5–5.1)

## 2013-05-24 LAB — LIPID PANEL
Total CHOL/HDL Ratio: 6
VLDL: 44.4 mg/dL — ABNORMAL HIGH (ref 0.0–40.0)

## 2013-05-24 LAB — HEPATIC FUNCTION PANEL
ALT: 36 U/L — ABNORMAL HIGH (ref 0–35)
Alkaline Phosphatase: 100 U/L (ref 39–117)
Bilirubin, Direct: 0 mg/dL (ref 0.0–0.3)
Total Bilirubin: 0.4 mg/dL (ref 0.3–1.2)
Total Protein: 7.1 g/dL (ref 6.0–8.3)

## 2013-05-24 LAB — LDL CHOLESTEROL, DIRECT: Direct LDL: 180.5 mg/dL

## 2013-05-24 NOTE — Telephone Encounter (Signed)
Pt would like to know if she can get a Rx sent to her pharmacy at  [facility] for her Diverticulitis   If someone can call the pt to let her know if a Rx is going to be sent to the pharmacy or not  [phone]

## 2013-05-24 NOTE — Telephone Encounter (Signed)
Spoke with pt & informed her that Dr. Lorin Picket is out of the office until Wednesday. Pt coming in tomorrow morning to see Raquel

## 2013-05-25 ENCOUNTER — Ambulatory Visit (INDEPENDENT_AMBULATORY_CARE_PROVIDER_SITE_OTHER): Payer: Self-pay | Admitting: Adult Health

## 2013-05-25 ENCOUNTER — Other Ambulatory Visit: Payer: Self-pay | Admitting: Internal Medicine

## 2013-05-25 ENCOUNTER — Encounter: Payer: Self-pay | Admitting: Adult Health

## 2013-05-25 ENCOUNTER — Other Ambulatory Visit: Payer: Self-pay | Admitting: *Deleted

## 2013-05-25 VITALS — BP 118/84 | HR 81 | Temp 98.3°F | Resp 12 | Wt 268.0 lb

## 2013-05-25 DIAGNOSIS — R1032 Left lower quadrant pain: Secondary | ICD-10-CM

## 2013-05-25 DIAGNOSIS — R945 Abnormal results of liver function studies: Secondary | ICD-10-CM

## 2013-05-25 MED ORDER — HYDROCODONE-ACETAMINOPHEN 5-325 MG PO TABS: 1.0000 | ORAL_TABLET | Freq: Four times a day (QID) | ORAL | Status: DC | PRN

## 2013-05-25 MED ORDER — CIPROFLOXACIN HCL 500 MG PO TABS: 500.0000 mg | ORAL_TABLET | Freq: Two times a day (BID) | ORAL | Status: DC

## 2013-05-25 MED ORDER — METRONIDAZOLE 500 MG PO TABS: 500.0000 mg | ORAL_TABLET | Freq: Three times a day (TID) | ORAL | Status: DC

## 2013-05-25 MED ORDER — ATORVASTATIN CALCIUM 20 MG PO TABS: 20.0000 mg | ORAL_TABLET | Freq: Every day | ORAL | Status: DC

## 2013-05-25 MED ORDER — PROMETHAZINE HCL 25 MG PO TABS: 25.0000 mg | ORAL_TABLET | Freq: Three times a day (TID) | ORAL | Status: DC | PRN

## 2013-05-25 NOTE — Progress Notes (Signed)
Order placed for f/u liver panel.  

## 2013-05-25 NOTE — Assessment & Plan Note (Signed)
Suspect diverticulitis flare. Proper for outpatient treatment. Start Cipro 500 mg twice a day x14 days and metronidazole 500 mg 3 times a day x14 days. Clear liquids for 2-3 days. Advance diet as tolerated after. Instructed patient to RTC if symptoms worsen or do not improve within the next 2-3 days, if she develops a fever of 102 or greater, she is unable to tolerate oral intake. Patient declines CBC at this time secondary to not having any insurance.

## 2013-05-25 NOTE — Patient Instructions (Signed)
Diverticulitis  Small pockets or "bubbles" can develop in the wall of the intestine. Diverticulitis is when those pockets become infected and inflamed. This causes stomach pain (usually on the left side).  HOME CARE   Take all medicine as told by your doctor.   Try a clear liquid diet (broth, tea, or water) for as long as told by your doctor.   Keep all follow-up visits with your doctor.   You may be put on a low-fiber diet once you start feeling better. Here are foods that have low-fiber:   White breads, cereals, rice, and pasta.   Cooked fruits and vegetables or soft fresh fruits and vegetables without the skin.   Ground or well-cooked tender beef, ham, veal, lamb, pork, or poultry.   Eggs and seafood.   After you are doing well on the low-fiber diet, you may be put on a high-fiber diet. Here are ways to increase your fiber:   Choose whole-grain breads, cereals, pasta, and brown rice.   Choose fruits and vegetables with skin on. Do not overcook the vegetables.   Choose nuts, seeds, legumes, dried peas, beans, and lentils.   Look for food products that have more than 3 grams of fiber per serving on the food label.  GET HELP RIGHT AWAY IF:   Your pain does not get better or gets worse.   You have trouble eating food.   You are not pooping (having bowel movements) like normal.   You have a temperature by mouth above 102 F (38.9 C), not controlled by medicine.   You keep throwing up (vomiting).   You have bloody or black, tarry poop (stools).   You are getting worse and not better.  MAKE SURE YOU:    Understand these instructions.   Will watch your condition.   Will get help right away if you are not doing well or get worse.  Document Released: 04/22/2008 Document Revised: 01/27/2012 Document Reviewed: 09/25/2009  [employer] Patient Information 2014 [employer].

## 2013-05-25 NOTE — Progress Notes (Signed)
  Subjective:    Patient ID: Kathleen Everett, female    DOB: 10/21/1971, 42 y.o.   MRN: [id number]  HPI  Patient is a pleasant 42 year old female with history of diverticulitis who presents to clinic with c/o LLQ pain which began on Friday. She reports slight indiscretion in her diet with subsequent symptoms. She reports having mild nausea, frequent loose stools with some mucus. She denies any blood in her stool, fever. She is tolerating POs. Patient had left over Flagyl from previous episode and began to take this. Prescription is from 2012.   Current Outpatient Prescriptions on File Prior to Visit  Medication Sig Dispense Refill  . acetaminophen (TYLENOL) 500 MG tablet Take 500 mg by mouth 2 (two) times daily.      Marland Kitchen buPROPion (WELLBUTRIN SR) 200 MG 12 hr tablet Take 200 mg by mouth daily.       . cyanocobalamin (,VITAMIN B-12,) 1000 MCG/ML injection Give q week x 4 weeks and then q month.      . cyclobenzaprine (FLEXERIL) 5 MG tablet Take 1 tablet (5 mg total) by mouth daily as needed for muscle spasms.  30 tablet  0  . estradiol (ESTRACE) 1 MG tablet Take 1 tablet (1 mg total) by mouth daily.  30 tablet  2  . ibuprofen (ADVIL,MOTRIN) 200 MG tablet Take 200 mg by mouth every 6 (six) hours as needed.      . lidocaine (LIDODERM) 5 % Place 1 patch onto the skin daily. Remove & Discard patch within 12 hours or as directed by MD      . omeprazole (PRILOSEC OTC) 20 MG tablet Take 20 mg by mouth 2 (two) times daily.      . simvastatin (ZOCOR) 20 MG tablet Take 1 tablet (20 mg total) by mouth at bedtime.  30 tablet  3  . traMADol (ULTRAM) 50 MG tablet Take 1 tablet (50 mg total) by mouth every 6 (six) hours as needed for pain.  120 tablet  1   No current facility-administered medications on file prior to visit.    Review of Systems  Constitutional: Negative for fever and chills.  Gastrointestinal: Positive for nausea and abdominal pain. Negative for vomiting, constipation, blood  in stool and anal bleeding.       Loose stools with some mucus.  Neurological: Negative.   Psychiatric/Behavioral: Negative.      BP 118/84  Pulse 81  Temp(Src) 98.3 F (36.8 C) (Oral)  Resp 12  Wt 268 lb (121.564 kg)  BMI 38.45 kg/m2  SpO2 98%  LMP 12/08/2006     Objective:   Physical Exam  Constitutional: She is oriented to person, place, and time.  Overweight female appearing uncomfortable  Cardiovascular: Normal rate and regular rhythm.   Pulmonary/Chest: Effort normal. No respiratory distress.  Abdominal: Soft. She exhibits no mass. There is tenderness. There is no rebound and no guarding.  Bowel sounds are hyperactive in the left lower quadrant. Bowel sounds normoactive elsewhere. Tenderness with palpating left lower quadrant.  Neurological: She is alert and oriented to person, place, and time.  Skin: Skin is warm and dry.  Psychiatric: She has a normal mood and affect. Her behavior is normal. Judgment and thought content normal.          Assessment & Plan:

## 2013-06-08 ENCOUNTER — Other Ambulatory Visit: Payer: Self-pay

## 2013-06-09 ENCOUNTER — Other Ambulatory Visit (INDEPENDENT_AMBULATORY_CARE_PROVIDER_SITE_OTHER): Payer: Self-pay

## 2013-06-09 DIAGNOSIS — R7989 Other specified abnormal findings of blood chemistry: Secondary | ICD-10-CM

## 2013-06-09 DIAGNOSIS — R945 Abnormal results of liver function studies: Secondary | ICD-10-CM

## 2013-06-09 LAB — HEPATIC FUNCTION PANEL
Albumin: 3.9 g/dL (ref 3.5–5.2)
Total Protein: 7.1 g/dL (ref 6.0–8.3)

## 2013-06-10 ENCOUNTER — Other Ambulatory Visit: Payer: Self-pay | Admitting: Internal Medicine

## 2013-06-10 DIAGNOSIS — E7800 Pure hypercholesterolemia, unspecified: Secondary | ICD-10-CM

## 2013-06-10 NOTE — Progress Notes (Signed)
Ast/alt ordered.

## 2013-06-22 ENCOUNTER — Encounter: Payer: Self-pay | Admitting: Internal Medicine

## 2013-07-14 ENCOUNTER — Other Ambulatory Visit: Payer: Self-pay | Admitting: *Deleted

## 2013-07-14 MED ORDER — TRAMADOL HCL 50 MG PO TABS: 50.0000 mg | ORAL_TABLET | Freq: Four times a day (QID) | ORAL | Status: DC | PRN

## 2013-08-12 ENCOUNTER — Other Ambulatory Visit: Payer: Self-pay | Admitting: Internal Medicine

## 2013-08-19 ENCOUNTER — Ambulatory Visit (INDEPENDENT_AMBULATORY_CARE_PROVIDER_SITE_OTHER): Payer: Self-pay | Admitting: Adult Health

## 2013-08-19 ENCOUNTER — Encounter: Payer: Self-pay | Admitting: Adult Health

## 2013-08-19 VITALS — BP 112/76 | HR 96 | Temp 98.5°F | Resp 12 | Wt 268.5 lb

## 2013-08-19 DIAGNOSIS — M549 Dorsalgia, unspecified: Secondary | ICD-10-CM

## 2013-08-19 DIAGNOSIS — G8929 Other chronic pain: Secondary | ICD-10-CM

## 2013-08-19 MED ORDER — MELOXICAM 15 MG PO TABS: 15.0000 mg | ORAL_TABLET | Freq: Every day | ORAL | Status: DC

## 2013-08-19 NOTE — Progress Notes (Signed)
Subjective:    Patient ID: Kathleen Everett, female    DOB: 06-21-1971, 42 y.o.   MRN: [id number]  HPI  Pt presents to clinic with c/o lower back pain. Pt reports chronic back pain, states she had more pain than usual on Sunday.  Pt states she went to sit down on the commode on Monday and her back "locked up".  Pt reports severe lower back pain since.  Pt states the pain is tolerable when sitting still but becomes severe with any movement.  Pt states she has been using a lidoderm patch and taking Ibuprofen bid, tramadol q6hours, and flexeril prn.  Pt reports very little relief with both.  Pt has been using a cane since Monday to assist with ambulating d/t pain. Pt states she went to a chiropractor on Monday and is scheduled to return today but didn\'t think she could lay on the table d/t pain. Pt states she often has radiation of pain down left leg but has not noted any radiation of pain since Tuesday.  Pt denies numbness or tingling in legs, denies bowel or bladder incontinence.   Past Medical History  Diagnosis Date  . Hyperlipidemia   . Chronic tension headaches   . Dysmenorrhea   . Hidradenitis   . Endometriosis   . Depression   . Osteoarthritis   . Obesity   . Ovarian cyst     hx  . GERD (gastroesophageal reflux disease)   . Chiari malformation     s/p sgy decompression    Past Surgical History  Procedure Laterality Date  . Cystoscopy      bladder ulcers  . Cesarean section      19 99/2004 twins  . Temporomandibular joint surgery  2000  . Ovarian cyst removal  2001    right  . Breast biopsy      fibroadenoma  . Hernia repair  2003  . Tubal ligation      Bilateral  . Abdominal hysterectomy  03/27/07    laparoscopic  . Chiari decompression      History   Social History  . Marital Status: Married    Spouse Name: N/A    Number of Children: 3  . Years of Education: N/A   Occupational History  . Not on file.   Social History Main Topics  . Smoking status: Never  Smoker   . Smokeless tobacco: Never Used  . Alcohol Use: Yes     Comment: rare  . Drug Use: No  . Sexual Activity: Not on file   Other Topics Concern  . Not on file   Social History Narrative  . No narrative on file     Current Outpatient Prescriptions on File Prior to Visit  Medication Sig Dispense Refill  . acetaminophen (TYLENOL) 500 MG tablet Take 500 mg by mouth 2 (two) times daily.      Marland Kitchen buPROPion (WELLBUTRIN SR) 200 MG 12 hr tablet Take 200 mg by mouth daily.       . cyanocobalamin (,VITAMIN B-12,) 1000 MCG/ML injection Give q week x 4 weeks and then q month.      . cyclobenzaprine (FLEXERIL) 5 MG tablet TAKE ONE TABLET BY MOUTH ONCE DAILY AS NEEDED FOR MUSCLE SPASM  30 tablet  0  . estradiol (ESTRACE) 1 MG tablet Take 1 tablet (1 mg total) by mouth daily.  30 tablet  2  . lidocaine (LIDODERM) 5 % Place 1 patch onto the skin daily. Remove & Discard patch  within 12 hours or as directed by MD      . promethazine (PHENERGAN) 25 MG tablet Take 1 tablet (25 mg total) by mouth every 8 (eight) hours as needed for nausea.  30 tablet  0  . simvastatin (ZOCOR) 20 MG tablet Take 1 tablet (20 mg total) by mouth at bedtime.  30 tablet  3  . traMADol (ULTRAM) 50 MG tablet Take 1 tablet (50 mg total) by mouth every 6 (six) hours as needed for pain.  120 tablet  1   No current facility-administered medications on file prior to visit.     Review of Systems  Constitutional: Negative.   Musculoskeletal: Positive for back pain.       Chronic low back pain, acute exacerbation of pain since Monday       Objective:   Physical Exam  Constitutional: She is oriented to person, place, and time. She appears well-developed and well-nourished.  Musculoskeletal:       Lumbar back: She exhibits pain.       Back:  Patient is currently not exhibiting radiation of pain. Pain is centralized ~ at L5-S1. Difficulty moving from sitting to standing position.   Neurological: She is alert  and oriented to person, place, and time.  Skin: Skin is warm and dry.  Psychiatric: She has a normal mood and affect. Her behavior is normal. Judgment and thought content normal.    BP 112/76  Pulse 96  Temp(Src) 98.5 F (36.9 C) (Oral)  Resp 12  Wt 268 lb 8 oz (121.791 kg)  BMI 38.53 kg/m2  SpO2 98%  LMP 12/08/2006      Assessment & Plan:

## 2013-08-19 NOTE — Patient Instructions (Addendum)
  Start Meloxicam 15 mg daily. Do not take ibuprofen while taking this medication.  Call back with the information on the [facility] and I will make referral.  Ice to affected area 4-5 times a day. You may alternate with heat.  Continue your Tramadol and muscle relaxer.

## 2013-08-19 NOTE — Assessment & Plan Note (Addendum)
Acute on chronic back pain. Started Monday when squatting to sit on the commode. Already taking Tramadol 50 mg q6 hours prn. Patient reports she normally takes this RTC. She has also been taking ibuprofen and flexeril. Lidoderm patch to the area. Start Meloxicam 15 mg daily. Stop ibuprofen. Instructed patient not to take ibuprofen with meloxicam. Ice to affected area 4-5 times daily for 15-20 min. May alternate with heat. Refer to Pain Clinic at [facility] in [place]. Followed by chiropractor. TENS unit may be of benefit.

## 2013-08-20 ENCOUNTER — Other Ambulatory Visit: Payer: Self-pay | Admitting: *Deleted

## 2013-08-20 ENCOUNTER — Telehealth: Payer: Self-pay | Admitting: Internal Medicine

## 2013-08-20 MED ORDER — BUPROPION HCL ER (XL) 150 MG PO TB24: 150.0000 mg | ORAL_TABLET | Freq: Every day | ORAL | Status: DC

## 2013-08-20 NOTE — Telephone Encounter (Signed)
Sent to [facility] yesterday by Raquel.  Wanted to let Raquel know they started percocet 3x daily and made her sign a contract.  Pt says she has signed one here for Tramadol.  [facility] told her that they would now be over her pain management.

## 2013-08-20 NOTE — Telephone Encounter (Signed)
FYI

## 2013-08-20 NOTE — Telephone Encounter (Signed)
noted 

## 2013-08-20 NOTE — Telephone Encounter (Signed)
Pharmacy stated that the cash price for the Wellbutrin SR 200mg  is $66.21. Recommended switching to Wellbutrin XL 150mg  at a cost of $45 to save the patient money. (pt notified & okay to change)

## 2013-09-10 ENCOUNTER — Encounter: Payer: Self-pay | Admitting: Internal Medicine

## 2013-09-10 ENCOUNTER — Ambulatory Visit (INDEPENDENT_AMBULATORY_CARE_PROVIDER_SITE_OTHER): Payer: Self-pay | Admitting: Internal Medicine

## 2013-09-10 VITALS — BP 110/80 | HR 78 | Temp 97.9°F | Ht 70.0 in | Wt 273.5 lb

## 2013-09-10 DIAGNOSIS — I341 Nonrheumatic mitral (valve) prolapse: Secondary | ICD-10-CM

## 2013-09-10 DIAGNOSIS — M549 Dorsalgia, unspecified: Secondary | ICD-10-CM

## 2013-09-10 DIAGNOSIS — G4730 Sleep apnea, unspecified: Secondary | ICD-10-CM

## 2013-09-10 DIAGNOSIS — E7800 Pure hypercholesterolemia, unspecified: Secondary | ICD-10-CM

## 2013-09-10 DIAGNOSIS — G8929 Other chronic pain: Secondary | ICD-10-CM

## 2013-09-10 DIAGNOSIS — R51 Headache: Secondary | ICD-10-CM

## 2013-09-10 DIAGNOSIS — I059 Rheumatic mitral valve disease, unspecified: Secondary | ICD-10-CM

## 2013-09-10 LAB — BASIC METABOLIC PANEL
BUN: 10 mg/dL (ref 6–23)
Calcium: 9.2 mg/dL (ref 8.4–10.5)
Chloride: 106 mEq/L (ref 96–112)
GFR: 89.49 mL/min (ref >60.00)
Glucose, Bld: 89 mg/dL (ref 70–99)
Potassium: 4.5 mEq/L (ref 3.5–5.1)
Sodium: 140 mEq/L (ref 135–145)

## 2013-09-10 LAB — LDL CHOLESTEROL, DIRECT: Direct LDL: 169.9 mg/dL

## 2013-09-10 LAB — HEPATIC FUNCTION PANEL
ALT: 30 U/L (ref 0–35)
Bilirubin, Direct: 0.1 mg/dL (ref 0.0–0.3)
Total Bilirubin: 0.4 mg/dL (ref 0.3–1.2)

## 2013-09-10 LAB — LIPID PANEL
HDL: 46.7 mg/dL (ref >39.00)
Triglycerides: 170 mg/dL — ABNORMAL HIGH (ref 0.0–149.0)
VLDL: 34 mg/dL (ref 0.0–40.0)

## 2013-09-10 MED ORDER — LEVOFLOXACIN 500 MG PO TABS: 500.0000 mg | ORAL_TABLET | Freq: Every day | ORAL | Status: DC

## 2013-09-11 ENCOUNTER — Other Ambulatory Visit: Payer: Self-pay | Admitting: Internal Medicine

## 2013-09-11 DIAGNOSIS — E7800 Pure hypercholesterolemia, unspecified: Secondary | ICD-10-CM

## 2013-09-11 NOTE — Progress Notes (Signed)
Order placed for f/u liver panel.  

## 2013-09-13 ENCOUNTER — Other Ambulatory Visit: Payer: Self-pay | Admitting: *Deleted

## 2013-09-13 ENCOUNTER — Encounter: Payer: Self-pay | Admitting: Internal Medicine

## 2013-09-13 ENCOUNTER — Telehealth: Payer: Self-pay | Admitting: Internal Medicine

## 2013-09-13 MED ORDER — DOXYCYCLINE HYCLATE 100 MG PO CAPS: 100.0000 mg | ORAL_CAPSULE | Freq: Two times a day (BID) | ORAL | Status: DC

## 2013-09-13 NOTE — Assessment & Plan Note (Signed)
Never had the CPAP titration.  Encouraged her to schedule.  She is not able at this time.  Instructed to avoid sleeping supine and avoid sedating medications.

## 2013-09-13 NOTE — Telephone Encounter (Signed)
Please advise (Not sure where she left a voicemail-No message was on my phone)

## 2013-09-13 NOTE — Progress Notes (Signed)
Subjective:    Patient ID: Kathleen Everett, female    DOB: [birth year], 42 y.o.   MRN: [id number]  HPI 42 year old female with past history of allergy problems, bladder ulcers with previous urological w/up requiring pubovaginal sling, depression, hypercholesterolemia and s/p chiari decompression.  She comes in today for a scheduled follow up.   She was diagnosed with sleep apnea.  Had both obstructive and central sleep apnea.  Had a redo test off pain medication and the central apnea resolved.  She never followed through with the CPAP titration.  Have discussed with her.  States she just can't afford to have this done.  Discussed the need to avoid sleeping supine and avoid sedating medications.   She is taking Flexeril.  Feels she needs to help her function.   Some increased fatigue.  Still with the back pain.  Has settled her workman's comp.  Now being seen at the pain clinic.  States she is unable to work secondary to the increased pain and decreased function.  She is trying to get disability.  Had a bad flare with her back recently.  See Raquel's note for details.  Better now.  Has followed up with pain clinic.  She reports noticing some numbness and tingling down her left leg.  She also reports persistent headaches.  Light bothers her.  Some dizziness.  Discussed further w/up (especially given the history of chiari malformation).   She declines.      Past Medical History  Diagnosis Date  . Hyperlipidemia   . Chronic tension headaches   . Dysmenorrhea   . Hidradenitis   . Endometriosis   . Depression   . Osteoarthritis   . Obesity   . Ovarian cyst     hx  . GERD (gastroesophageal reflux disease)   . Chiari malformation     s/p sgy decompression    Current Outpatient Prescriptions on File Prior to Visit  Medication Sig Dispense Refill  . acetaminophen (TYLENOL) 500 MG tablet Take 500 mg by mouth 2 (two) times daily.      Marland Kitchen buPROPion (WELLBUTRIN XL) 150 MG 24 hr tablet Take 1 tablet  (150 mg total) by mouth daily.  30 tablet  2  . cyanocobalamin (,VITAMIN B-12,) 1000 MCG/ML injection Inject 1,000 mcg into the muscle every 30 (thirty) days.       Marland Kitchen estradiol (ESTRACE) 1 MG tablet Take 1 tablet (1 mg total) by mouth daily.  30 tablet  2  . lidocaine (LIDODERM) 5 % Place 1 patch onto the skin daily. Remove & Discard patch within 12 hours or as directed by MD      . meloxicam (MOBIC) 15 MG tablet Take 1 tablet (15 mg total) by mouth daily.  30 tablet  1  . promethazine (PHENERGAN) 25 MG tablet Take 1 tablet (25 mg total) by mouth every 8 (eight) hours as needed for nausea.  30 tablet  0  . simvastatin (ZOCOR) 20 MG tablet Take 1 tablet (20 mg total) by mouth at bedtime.  30 tablet  3   No current facility-administered medications on file prior to visit.    Review of Systems Patient reports some lightheadedness.  Still with headaches.   No chest pain, tightness or palpitations.  No increased shortness of breath, cough or congestion.  Feels breathing is stable.  No nausea or vomiting.  She does report some lower abdominal pressure.  Eating and drinking ok.  No bowel change, such as diarrhea, constipation, BRBPR  or melana.  No urine change.  Has chronic back pain as outlined.  Limits her mobility and function.  Now being followed at pain clinic.         Objective:   Physical Exam  Filed Vitals:   09/10/13 0800  BP: 110/80  Pulse: 78  Temp: 97.9 F (36.6 C)   Blood pressure recheck:  62/68  42 year old female in no acute distress.   HEENT:  Nares- clear.  Oropharynx - without lesions. NECK:  Supple.  Nontender.  No audible bruit.  HEART:  Appears to be regular. LUNGS:  No crackles or wheezing audible.  Respirations even and unlabored.  RADIAL PULSE:  Equal bilaterally.     ABDOMEN:  Soft, nontender.  Bowel sounds present and normal.  No audible abdominal bruit.    EXTREMITIES:  No increased edema present.  DP pulses palpable and equal bilaterally.  MSK:  Some  increased pain with getting on and off the exam table - more localized to the lower back.          Assessment & Plan:  PSYCH.  Has been on wellbutrin.  Was seeing Dr Lenis Noon.  Was referred to Dr Janeece Riggers.  Not seeing anyone now.  She feels things are stable.  Follow.    GI.  Has seen GI.  Had extensive w/up.  Stable.  Follow.   GYN.  S/p BSO.  Was seeing Dr Harold Hedge.  Has some persistent lower abdominal pressure.  Declines further w/uup.    PREVIOUS DOCUMENTED ELEVATED BLOOD PRESSURE.  Blood pressure doing well.  On no medication.  Follow.   BREAST NODULE.  Mammogram 1/12 revealed a smoothly marginated nodule.  Mammogram read as a BiRADS II.  Recommended yearly follow up.  Had discussed with her regarding further w/up.  She declined.  Follow.  Discussed the need for mammogram.  Can get through the [facility].  She did call and is scheduled to have this soon.    HEALTH MAINTENANCE.  Physical last visit.   Mammogram as outlined.

## 2013-09-13 NOTE — Assessment & Plan Note (Signed)
Has a documented history of chronic headaches.  Has a history of a chiari malformation.  Is s/p chiari decompression.  Followed by neurosurgery.  MRI 01/01/11.  Has declined any further testing at this time.  We discussed this at length.  Given her history and persistent headaches with the associated light headedness, needs further evaluation.  She declines.

## 2013-09-13 NOTE — Assessment & Plan Note (Signed)
Saw Dr Juel Burrow.  Had ECHO - MVP.  Currently doing well.  Follow.

## 2013-09-13 NOTE — Assessment & Plan Note (Signed)
Being followed by pain clinic.  Gets her pain meds through them.  Stable currently.

## 2013-09-13 NOTE — Assessment & Plan Note (Signed)
On simvastatin now.  Tolerating.  Low cholesterol diet and exercise.  Check lipid panel and liver function.

## 2013-09-13 NOTE — Telephone Encounter (Signed)
Is her ear still red and swollen?  Did she use the warm compresses and take the earring out?  If she did this and there is no swelling or redness then she may not need abx.  But if still red and swollen will need abx.  Confirm her only allergy is pcn and sulfa.  If so, then can try doxycycline 100mg  bid x 1 week.

## 2013-09-13 NOTE — Telephone Encounter (Signed)
Pt state that ear is still red & swollen and she tried to warm compresses. Sent in Rx for Doxycycline 100mg  BID x 7days. Pt notified

## 2013-09-13 NOTE — Telephone Encounter (Signed)
Pt was seen on 10/24, was prescribed Levaquin.  Pt states it was too expensive.  States she left a vm that she needs something else called in that will not be so expensive (pharmacy advised keflex, pt does not have insurance).  [facility] [place].  Pt asking for a call to her before this is called in to the pharmacy.

## 2013-09-14 ENCOUNTER — Other Ambulatory Visit: Payer: Self-pay | Admitting: *Deleted

## 2013-09-14 MED ORDER — ATORVASTATIN CALCIUM 20 MG PO TABS: 20.0000 mg | ORAL_TABLET | Freq: Every day | ORAL | Status: DC

## 2013-10-18 ENCOUNTER — Ambulatory Visit: Payer: Self-pay

## 2013-10-19 ENCOUNTER — Encounter: Payer: Self-pay | Admitting: Internal Medicine

## 2013-12-10 ENCOUNTER — Telehealth: Payer: Self-pay | Admitting: Internal Medicine

## 2013-12-10 NOTE — Telephone Encounter (Signed)
Noted.  We can discuss at her appt unless needs something before.

## 2013-12-10 NOTE — Telephone Encounter (Signed)
See below

## 2013-12-10 NOTE — Telephone Encounter (Signed)
Patient has medicaid now. She is stating that she is ready to move forward with the neurology apts and several other things that yall have been putting off. The patient is scheduled to come in at the end of Feb.

## 2013-12-10 NOTE — Telephone Encounter (Signed)
No ma'am, the office of [facility] needs to contact us. I will make the patient aware.

## 2013-12-10 NOTE — Telephone Encounter (Signed)
Patient needs a referral sent to [facility] for pain clinic she was seen today at [facility]. [place] at [phone]

## 2013-12-10 NOTE — Telephone Encounter (Signed)
Do I need to do anything with this?

## 2013-12-13 ENCOUNTER — Other Ambulatory Visit: Payer: Self-pay | Admitting: Internal Medicine

## 2013-12-14 NOTE — Telephone Encounter (Signed)
Pt accepted appt.

## 2013-12-14 NOTE — Telephone Encounter (Signed)
Pt left voicemail request a referral to [facility]. Their fax# is: [phone] (Note: pt has a follow-up scheduled on 01/12/14 for a 4 mth f/u). She would like to move up appointment if possible because she is scheduled to see the bariatric office the same week (12/10/13). Please advise on a new date/time (I could not find a 30 min slot available).

## 2013-12-14 NOTE — Telephone Encounter (Signed)
I can see her 12/24/13 at 11:30.

## 2013-12-24 ENCOUNTER — Ambulatory Visit: Payer: Self-pay | Admitting: Internal Medicine

## 2013-12-27 ENCOUNTER — Encounter: Payer: Self-pay | Admitting: Internal Medicine

## 2013-12-27 ENCOUNTER — Ambulatory Visit (INDEPENDENT_AMBULATORY_CARE_PROVIDER_SITE_OTHER): Payer: Medicaid Other | Admitting: Internal Medicine

## 2013-12-27 VITALS — BP 110/80 | HR 81 | Temp 98.7°F | Ht 70.0 in | Wt 281.5 lb

## 2013-12-27 DIAGNOSIS — R1032 Left lower quadrant pain: Secondary | ICD-10-CM

## 2013-12-27 DIAGNOSIS — G8929 Other chronic pain: Secondary | ICD-10-CM

## 2013-12-27 DIAGNOSIS — E7800 Pure hypercholesterolemia, unspecified: Secondary | ICD-10-CM

## 2013-12-27 DIAGNOSIS — Z7189 Other specified counseling: Secondary | ICD-10-CM

## 2013-12-27 DIAGNOSIS — M549 Dorsalgia, unspecified: Secondary | ICD-10-CM

## 2013-12-27 DIAGNOSIS — R079 Chest pain, unspecified: Secondary | ICD-10-CM

## 2013-12-27 DIAGNOSIS — I059 Rheumatic mitral valve disease, unspecified: Secondary | ICD-10-CM

## 2013-12-27 DIAGNOSIS — R51 Headache: Secondary | ICD-10-CM

## 2013-12-27 DIAGNOSIS — G4730 Sleep apnea, unspecified: Secondary | ICD-10-CM

## 2013-12-27 DIAGNOSIS — I341 Nonrheumatic mitral (valve) prolapse: Secondary | ICD-10-CM

## 2013-12-27 MED ORDER — METRONIDAZOLE 500 MG PO TABS: 500.0000 mg | ORAL_TABLET | Freq: Three times a day (TID) | ORAL | Status: DC

## 2013-12-27 MED ORDER — CIPROFLOXACIN HCL 500 MG PO TABS: 500.0000 mg | ORAL_TABLET | Freq: Two times a day (BID) | ORAL | Status: DC

## 2013-12-27 NOTE — Progress Notes (Signed)
Pre-visit discussion using our clinic review tool. No additional management support is needed unless otherwise documented below in the visit note.  

## 2014-01-02 ENCOUNTER — Encounter: Payer: Self-pay | Admitting: Internal Medicine

## 2014-01-02 DIAGNOSIS — Z7189 Other specified counseling: Secondary | ICD-10-CM | POA: Insufficient documentation

## 2014-01-02 NOTE — Assessment & Plan Note (Signed)
Never had the CPAP titration.  I have encouraged her to schedule previously.  She was not able.  Have instructed to avoid sleeping supine and avoid sedating medications.

## 2014-01-02 NOTE — Assessment & Plan Note (Signed)
Feels similar to her previous diverticular flares.  She has already started cipro and flagyl with noted improvement.  Will continue cipro and flagyl as directed.  Will need to be reevaluated if symptoms change, worsen or do not resolve.

## 2014-01-02 NOTE — Assessment & Plan Note (Signed)
Has a documented history of chronic headaches.  Has a history of a chiari malformation.  Is s/p chiari decompression.  Followed by neurosurgery.  MRI 01/01/11.  Has declined any further testing since.  She is now agreeable for referral back to her neurosurgeon (Dr Cristopher Peru - [phone]).

## 2014-01-02 NOTE — Assessment & Plan Note (Addendum)
On atorvastatin now.  Tolerating.  Low cholesterol diet and exercise.  Check lipid panel and liver function.

## 2014-01-02 NOTE — Assessment & Plan Note (Signed)
Being followed by pain clinic.  Gets her pain meds through them.  Stable currently.  Now on disability.

## 2014-01-02 NOTE — Progress Notes (Signed)
Subjective:    Patient ID: Kathleen Everett, female    DOB: 12/25/1970, 43 y.o.   MRN: [id number]  HPI 43 year old female with past history of allergy problems, bladder ulcers with previous urological w/up requiring pubovaginal sling, depression, hypercholesterolemia and s/p chiari decompression.  She comes in today for a scheduled follow up.   She was diagnosed with sleep apnea.  Had both obstructive and central sleep apnea.  Had a redo test off pain medication and the central apnea resolved.  She never followed through with the CPAP titration.  Have discussed with her.  Previously reported that she could not afford to have the sleep study.   Have discussed the need to avoid sleeping supine and avoid sedating medications.  She continues to have back pain.  Has been followed at pain clinic.  States she is unable to work secondary to the increased pain and decreased function.  Now on disability.    She also reports persistent headaches.  Light bothers her.  Some dizziness.  Had discussed further w/up (especially given the history of chiari malformation).  She had previously declined.  Now she is ready for referral back to her neurosurgeon (Dr Cristopher Peru) for further evaluation.  Wants to hold on further testing here (including MRI, etc).  She reports having eaten popcorn a few days ago.  Subsequently developed nausea and LLQ pain.  No fever and no vomiting.  Feels similar to her previous diverticular flare.  Started on cipro and flagyl (that she had left over).  Is some better, but still has persistent symptoms.  Also reports some left side chest pain and right breast discomfort.  Not necessarily brought on by increased activity or exertion.  No sob.  No cough or congestion.    She is planning on having bariatric surgery.  She has an appointment on 01/10/14.  States needs a referral.  She also needs a referral for a registered dietician and nutritionist.  Planning to go to [facility].  She has  already started decreasing the amount of fast food she is eating.  Has increased water intake.  Does not exercise on a regular basis.       Past Medical History  Diagnosis Date  . Hyperlipidemia   . Chronic tension headaches   . Dysmenorrhea   . Hidradenitis   . Endometriosis   . Depression   . Osteoarthritis   . Obesity   . Ovarian cyst     hx  . GERD (gastroesophageal reflux disease)   . Chiari malformation     s/p sgy decompression    Current Outpatient Prescriptions on File Prior to Visit  Medication Sig Dispense Refill  . acetaminophen (TYLENOL) 500 MG tablet Take 500 mg by mouth 2 (two) times daily.      Marland Kitchen atorvastatin (LIPITOR) 20 MG tablet Take 1 tablet (20 mg total) by mouth daily.  30 tablet  1  . buPROPion (WELLBUTRIN XL) 150 MG 24 hr tablet Take 1 tablet (150 mg total) by mouth daily.  30 tablet  2  . cyanocobalamin (,VITAMIN B-12,) 1000 MCG/ML injection Inject 1,000 mcg into the muscle every 30 (thirty) days.       Marland Kitchen estradiol (ESTRACE) 1 MG tablet TAKE ONE TABLET BY MOUTH EVERY DAY  30 tablet  0  . lidocaine (LIDODERM) 5 % Place 1 patch onto the skin daily. Remove & Discard patch within 12 hours or as directed by MD      . meloxicam (MOBIC)  15 MG tablet Take 1 tablet (15 mg total) by mouth daily.  30 tablet  1   No current facility-administered medications on file prior to visit.    Review of Systems Patient reports some lightheadedness.  Still with headaches.   No palpitations.  Chest and breast discomfort as outlined.  No increased shortness of breath, cough or congestion.  Feels breathing is stable.  Some nausea.  No vomiting.  She does report some left lower abdominal discomfort as outlined.   Eating and drinking ok.  No bowel change, such as diarrhea, constipation, BRBPR or melana.   Has chronic back pain as outlined.  Limits her mobility and function.  Now being followed at pain clinic.  Planning for bariatric surgery.         Objective:   Physical  Exam  Filed Vitals:   12/27/13 1108  BP: 110/80  Pulse: 81  Temp: 98.7 F (102.53 C)   43 year old female in no acute distress.   HEENT:  Nares- clear.  Oropharynx - without lesions. NECK:  Supple.  Nontender.  No audible bruit.  HEART:  Appears to be regular. LUNGS:  No crackles or wheezing audible.  Respirations even and unlabored. BREASTS:  No nipple discharge.  Could not appreciate any palpable abnormality.    RADIAL PULSE:  Equal bilaterally.     ABDOMEN:  Soft, nontender.  Bowel sounds present and normal.  No audible abdominal bruit.    EXTREMITIES:  No increased edema present.  DP pulses palpable and equal bilaterally.  MSK:  Some increased pain with getting on and off the exam table - more localized to the lower back.          Assessment & Plan:  PSYCH.  Has been on wellbutrin.  Was seeing Dr Clovis Riley.  Was referred to Dr Kasandra Knudsen.  Not seeing anyone now.  She feels things are stable.  Follow.   GYN.  S/p BSO.  Was seeing Dr Rayford Halsted.  Has some persistent lower abdominal pressure.  Has declined further w/uup.    PREVIOUS DOCUMENTED ELEVATED BLOOD PRESSURE.  Blood pressure doing well.  On no medication.  Follow.   BREAST NODULE.  Mammogram 1/12 revealed a smoothly marginated nodule.  Mammogram read as a BiRADS II.  Recommended yearly follow up.  She just had f/u mammogram 10/18/13 - Birads I.     HEALTH MAINTENANCE.  Physical 04/09/13.   Mammogram as outlined.     I spent over 40 minutes with the patient and more than 50% of the time was spent in consultation regarding the above.

## 2014-01-02 NOTE — Assessment & Plan Note (Signed)
Saw Dr Lavera Guise.  Had ECHO - MVP.  With the chest pain, EKG obtained and revealed SR with no acute ischemic changes.  Discussed further w/up.  She plans to f/u with Dr Lavera Guise for further evaluation.

## 2014-01-02 NOTE — Assessment & Plan Note (Addendum)
She comes in today stating that she is planning to have bariatric surgery.  She also needs referral to [facility] to meet with a registered dietician for nutrition counseling.  Has already decreased fast food intake and increased water intake.  Planning her first visit at the bariatric clinic on 01/10/14.

## 2014-01-05 ENCOUNTER — Other Ambulatory Visit: Payer: Medicaid Other

## 2014-01-10 DIAGNOSIS — K5792 Diverticulitis of intestine, part unspecified, without perforation or abscess without bleeding: Secondary | ICD-10-CM | POA: Insufficient documentation

## 2014-01-10 DIAGNOSIS — G43909 Migraine, unspecified, not intractable, without status migrainosus: Secondary | ICD-10-CM | POA: Insufficient documentation

## 2014-01-10 DIAGNOSIS — G4733 Obstructive sleep apnea (adult) (pediatric): Secondary | ICD-10-CM

## 2014-01-10 DIAGNOSIS — Z6841 Body Mass Index (BMI) 40.0 and over, adult: Secondary | ICD-10-CM | POA: Insufficient documentation

## 2014-01-10 DIAGNOSIS — E668 Other obesity: Secondary | ICD-10-CM | POA: Insufficient documentation

## 2014-01-10 DIAGNOSIS — F329 Major depressive disorder, single episode, unspecified: Secondary | ICD-10-CM

## 2014-01-10 DIAGNOSIS — E7800 Pure hypercholesterolemia, unspecified: Secondary | ICD-10-CM | POA: Insufficient documentation

## 2014-01-10 DIAGNOSIS — M25551 Pain in right hip: Secondary | ICD-10-CM

## 2014-01-10 DIAGNOSIS — M549 Dorsalgia, unspecified: Secondary | ICD-10-CM | POA: Insufficient documentation

## 2014-01-10 DIAGNOSIS — F320 Major depressive disorder, single episode, mild: Secondary | ICD-10-CM | POA: Insufficient documentation

## 2014-01-10 HISTORY — DX: Pure hypercholesterolemia, unspecified: E78.00

## 2014-01-10 HISTORY — DX: Obstructive sleep apnea (adult) (pediatric): G47.33

## 2014-01-10 HISTORY — DX: Migraine, unspecified, not intractable, without status migrainosus: G43.909

## 2014-01-10 HISTORY — DX: Pain in right hip: M25.551

## 2014-01-12 ENCOUNTER — Ambulatory Visit: Payer: Self-pay | Admitting: Internal Medicine

## 2014-01-12 ENCOUNTER — Other Ambulatory Visit: Payer: Medicaid Other

## 2014-01-18 DIAGNOSIS — E559 Vitamin D deficiency, unspecified: Secondary | ICD-10-CM | POA: Insufficient documentation

## 2014-01-18 HISTORY — DX: Vitamin D deficiency, unspecified: E55.9

## 2014-01-19 ENCOUNTER — Other Ambulatory Visit: Payer: Medicaid Other

## 2014-01-25 ENCOUNTER — Encounter: Payer: Self-pay | Admitting: Internal Medicine

## 2014-01-25 ENCOUNTER — Other Ambulatory Visit (INDEPENDENT_AMBULATORY_CARE_PROVIDER_SITE_OTHER): Payer: Medicaid Other

## 2014-01-25 DIAGNOSIS — Z7189 Other specified counseling: Secondary | ICD-10-CM

## 2014-01-25 DIAGNOSIS — M549 Dorsalgia, unspecified: Secondary | ICD-10-CM

## 2014-01-25 DIAGNOSIS — R1032 Left lower quadrant pain: Secondary | ICD-10-CM

## 2014-01-25 DIAGNOSIS — E7800 Pure hypercholesterolemia, unspecified: Secondary | ICD-10-CM

## 2014-01-25 DIAGNOSIS — G8929 Other chronic pain: Secondary | ICD-10-CM

## 2014-01-25 LAB — CBC WITH DIFFERENTIAL/PLATELET
BASOS PCT: 0.5 % (ref 0.0–3.0)
Basophils Absolute: 0 10*3/uL (ref 0.0–0.1)
EOS PCT: 2.5 % (ref 0.0–5.0)
Eosinophils Absolute: 0.2 10*3/uL (ref 0.0–0.7)
HCT: 39.9 % (ref 36.0–46.0)
Hemoglobin: 13.4 g/dL (ref 12.0–15.0)
LYMPHS ABS: 1.9 10*3/uL (ref 0.7–4.0)
Lymphocytes Relative: 30.1 % (ref 12.0–46.0)
MCHC: 33.5 g/dL (ref 30.0–36.0)
MCV: 84.4 fl (ref 78.0–100.0)
Monocytes Absolute: 0.3 10*3/uL (ref 0.1–1.0)
Monocytes Relative: 5.5 % (ref 3.0–12.0)
Neutro Abs: 3.9 10*3/uL (ref 1.4–7.7)
Neutrophils Relative %: 61.4 % (ref 43.0–77.0)
Platelets: 239 10*3/uL (ref 150.0–400.0)
RBC: 4.73 Mil/uL (ref 3.87–5.11)
RDW: 14.3 % (ref 11.5–14.6)
WBC: 6.3 10*3/uL (ref 4.5–10.5)

## 2014-01-25 LAB — BASIC METABOLIC PANEL
BUN: 12 mg/dL (ref 6–23)
CALCIUM: 9.1 mg/dL (ref 8.4–10.5)
CO2: 25 mEq/L (ref 19–32)
Chloride: 106 mEq/L (ref 96–112)
Creatinine, Ser: 0.7 mg/dL (ref 0.4–1.2)
GFR: 98.21 mL/min (ref >60.00)
GLUCOSE: 92 mg/dL (ref 70–99)
POTASSIUM: 4.2 meq/L (ref 3.5–5.1)
SODIUM: 140 meq/L (ref 135–145)

## 2014-01-25 LAB — TSH: TSH: 2.78 u[IU]/mL (ref 0.35–5.50)

## 2014-01-25 LAB — LIPID PANEL
Cholesterol: 194 mg/dL (ref 0–200)
HDL: 42.1 mg/dL (ref >39.00)
LDL Cholesterol: 112 mg/dL — ABNORMAL HIGH (ref 0–99)
Total CHOL/HDL Ratio: 5
Triglycerides: 199 mg/dL — ABNORMAL HIGH (ref 0.0–149.0)
VLDL: 39.8 mg/dL (ref 0.0–40.0)

## 2014-01-25 LAB — URINALYSIS, ROUTINE W REFLEX MICROSCOPIC
Bilirubin Urine: NEGATIVE
Hgb urine dipstick: NEGATIVE
Ketones, ur: NEGATIVE
Leukocytes, UA: NEGATIVE
NITRITE: NEGATIVE
PH: 6 (ref 5.0–8.0)
RBC / HPF: NONE SEEN (ref 0–?)
Specific Gravity, Urine: 1.02 (ref 1.000–1.030)
TOTAL PROTEIN, URINE-UPE24: NEGATIVE
Urine Glucose: NEGATIVE
Urobilinogen, UA: 0.2 (ref 0.0–1.0)
WBC UA: NONE SEEN (ref 0–?)

## 2014-01-25 LAB — HEPATIC FUNCTION PANEL
ALBUMIN: 3.9 g/dL (ref 3.5–5.2)
ALT: 27 U/L (ref 0–35)
AST: 23 U/L (ref 0–37)
Alkaline Phosphatase: 126 U/L — ABNORMAL HIGH (ref 39–117)
BILIRUBIN DIRECT: 0.1 mg/dL (ref 0.0–0.3)
BILIRUBIN TOTAL: 0.6 mg/dL (ref 0.3–1.2)
Total Protein: 6.7 g/dL (ref 6.0–8.3)

## 2014-01-25 LAB — AST: AST: 23 U/L (ref 0–37)

## 2014-01-25 LAB — ALT: ALT: 27 U/L (ref 0–35)

## 2014-01-25 MED ORDER — ESOMEPRAZOLE MAGNESIUM 40 MG PO CPDR: 40.0000 mg | DELAYED_RELEASE_CAPSULE | Freq: Every day | ORAL | Status: DC

## 2014-01-25 NOTE — Telephone Encounter (Signed)
Rx sent to pharmacy by [facility] for nexium

## 2014-01-26 DIAGNOSIS — G935 Compression of brain: Secondary | ICD-10-CM | POA: Insufficient documentation

## 2014-01-26 DIAGNOSIS — Q0700 Arnold-Chiari syndrome without spina bifida or hydrocephalus: Secondary | ICD-10-CM | POA: Insufficient documentation

## 2014-01-26 HISTORY — DX: Compression of brain: G93.5

## 2014-01-26 MED ORDER — BUPROPION HCL ER (SR) 200 MG PO TB12: 200.0000 mg | ORAL_TABLET | Freq: Every day | ORAL | Status: DC

## 2014-01-26 NOTE — Telephone Encounter (Signed)
Refilled wellbutrin SR 200mg  #30 with 3 refills.

## 2014-01-27 ENCOUNTER — Telehealth: Payer: Self-pay | Admitting: Internal Medicine

## 2014-01-27 ENCOUNTER — Encounter: Payer: Self-pay | Admitting: Internal Medicine

## 2014-01-27 DIAGNOSIS — R748 Abnormal levels of other serum enzymes: Secondary | ICD-10-CM

## 2014-01-27 NOTE — Telephone Encounter (Signed)
Pt notified of lab results via my chart.  Needs a non fasting lab in 2 weeks.  Please schedule and contact her with a lab appt date and time.  Thanks.    Dr Nicki Reaper

## 2014-02-03 NOTE — Telephone Encounter (Signed)
Appointment 3/25.  Sent pt my chart message letting her know about appointment date and tiem

## 2014-02-08 ENCOUNTER — Other Ambulatory Visit: Payer: Self-pay | Admitting: Internal Medicine

## 2014-02-09 ENCOUNTER — Other Ambulatory Visit (INDEPENDENT_AMBULATORY_CARE_PROVIDER_SITE_OTHER): Payer: Medicaid Other

## 2014-02-09 DIAGNOSIS — R748 Abnormal levels of other serum enzymes: Secondary | ICD-10-CM

## 2014-02-09 DIAGNOSIS — E7800 Pure hypercholesterolemia, unspecified: Secondary | ICD-10-CM

## 2014-02-09 LAB — HEPATIC FUNCTION PANEL
ALK PHOS: 118 U/L — AB (ref 39–117)
ALT: 18 U/L (ref 0–35)
AST: 18 U/L (ref 0–37)
Albumin: 4 g/dL (ref 3.5–5.2)
BILIRUBIN DIRECT: 0.1 mg/dL (ref 0.0–0.3)
TOTAL PROTEIN: 6.7 g/dL (ref 6.0–8.3)
Total Bilirubin: 0.3 mg/dL (ref 0.3–1.2)

## 2014-02-09 LAB — ALKALINE PHOSPHATASE: ALK PHOS: 118 U/L — AB (ref 39–117)

## 2014-02-10 ENCOUNTER — Encounter: Payer: Self-pay | Admitting: Internal Medicine

## 2014-02-17 ENCOUNTER — Telehealth: Payer: Self-pay | Admitting: Internal Medicine

## 2014-02-17 NOTE — Telephone Encounter (Signed)
Does she need a referral?  Just let me know what I need to do.

## 2014-02-17 NOTE — Telephone Encounter (Signed)
Eczema has been flaring.  She called Dr. Hans Eden office and has appt Tuesday.  States she thinks she may need a referral for the appt since she has insurance now [facility]).

## 2014-02-22 NOTE — Telephone Encounter (Signed)
I think she should be fine but if it comes back she does need it, I will take care of it.

## 2014-02-23 ENCOUNTER — Encounter: Payer: Self-pay | Admitting: *Deleted

## 2014-02-23 DIAGNOSIS — G4733 Obstructive sleep apnea (adult) (pediatric): Secondary | ICD-10-CM

## 2014-03-01 NOTE — Telephone Encounter (Signed)
Pt would like order placed to repeat sleep study. (see last ov note & last mychart message if more info needed)

## 2014-03-01 NOTE — Telephone Encounter (Signed)
Order split night sleep study.

## 2014-03-17 ENCOUNTER — Ambulatory Visit: Payer: Self-pay | Admitting: Internal Medicine

## 2014-03-21 ENCOUNTER — Other Ambulatory Visit: Payer: Self-pay | Admitting: Internal Medicine

## 2014-03-23 ENCOUNTER — Telehealth: Payer: Self-pay | Admitting: Internal Medicine

## 2014-03-23 NOTE — Telephone Encounter (Signed)
Pt notified of sleep study results and need for CPAP titration.  Order signed.

## 2014-04-01 ENCOUNTER — Other Ambulatory Visit: Payer: Self-pay | Admitting: *Deleted

## 2014-04-01 MED ORDER — ATORVASTATIN CALCIUM 20 MG PO TABS: 20.0000 mg | ORAL_TABLET | Freq: Every day | ORAL | Status: DC

## 2014-04-07 ENCOUNTER — Ambulatory Visit: Payer: Self-pay | Admitting: Internal Medicine

## 2014-04-19 ENCOUNTER — Encounter: Payer: Self-pay | Admitting: Internal Medicine

## 2014-04-19 ENCOUNTER — Ambulatory Visit (INDEPENDENT_AMBULATORY_CARE_PROVIDER_SITE_OTHER): Payer: Medicaid Other | Admitting: Internal Medicine

## 2014-04-19 VITALS — BP 120/82 | HR 92 | Temp 98.4°F | Ht 69.5 in | Wt 276.8 lb

## 2014-04-19 DIAGNOSIS — Z7189 Other specified counseling: Secondary | ICD-10-CM

## 2014-04-19 DIAGNOSIS — I059 Rheumatic mitral valve disease, unspecified: Secondary | ICD-10-CM

## 2014-04-19 DIAGNOSIS — K219 Gastro-esophageal reflux disease without esophagitis: Secondary | ICD-10-CM

## 2014-04-19 DIAGNOSIS — I341 Nonrheumatic mitral (valve) prolapse: Secondary | ICD-10-CM

## 2014-04-19 DIAGNOSIS — G4730 Sleep apnea, unspecified: Secondary | ICD-10-CM

## 2014-04-19 DIAGNOSIS — E669 Obesity, unspecified: Secondary | ICD-10-CM

## 2014-04-19 DIAGNOSIS — M25571 Pain in right ankle and joints of right foot: Secondary | ICD-10-CM

## 2014-04-19 DIAGNOSIS — M25579 Pain in unspecified ankle and joints of unspecified foot: Secondary | ICD-10-CM

## 2014-04-19 DIAGNOSIS — G8929 Other chronic pain: Secondary | ICD-10-CM

## 2014-04-19 DIAGNOSIS — M549 Dorsalgia, unspecified: Secondary | ICD-10-CM

## 2014-04-19 DIAGNOSIS — R51 Headache: Secondary | ICD-10-CM

## 2014-04-19 DIAGNOSIS — E7800 Pure hypercholesterolemia, unspecified: Secondary | ICD-10-CM

## 2014-04-19 MED ORDER — MAGNESIUM OXIDE -MG SUPPLEMENT 400 (240 MG) MG PO TABS: ORAL_TABLET | ORAL | Status: DC

## 2014-04-19 NOTE — Progress Notes (Signed)
Pre visit review using our clinic review tool, if applicable. No additional management support is needed unless otherwise documented below in the visit note. 

## 2014-04-21 ENCOUNTER — Ambulatory Visit (INDEPENDENT_AMBULATORY_CARE_PROVIDER_SITE_OTHER)
Admission: RE | Admit: 2014-04-21 | Discharge: 2014-04-21 | Disposition: A | Discharge status: Home / Self Care | Payer: Medicaid Other | Source: Ambulatory Visit | Attending: Internal Medicine | Admitting: Internal Medicine

## 2014-04-21 DIAGNOSIS — M25579 Pain in unspecified ankle and joints of unspecified foot: Secondary | ICD-10-CM

## 2014-04-21 DIAGNOSIS — M25571 Pain in right ankle and joints of right foot: Secondary | ICD-10-CM

## 2014-04-24 ENCOUNTER — Encounter: Payer: Self-pay | Admitting: Internal Medicine

## 2014-04-24 DIAGNOSIS — E669 Obesity, unspecified: Secondary | ICD-10-CM | POA: Insufficient documentation

## 2014-04-24 DIAGNOSIS — K219 Gastro-esophageal reflux disease without esophagitis: Secondary | ICD-10-CM | POA: Insufficient documentation

## 2014-04-24 DIAGNOSIS — M25571 Pain in right ankle and joints of right foot: Secondary | ICD-10-CM | POA: Insufficient documentation

## 2014-04-24 HISTORY — DX: Pain in right ankle and joints of right foot: M25.571

## 2014-04-24 NOTE — Assessment & Plan Note (Signed)
Discussed diet, exercise and weight loss.  She is contemplating weight loss.

## 2014-04-24 NOTE — Assessment & Plan Note (Signed)
Just recently had CPAP titration.  Need results.  Have instructed her to avoid sleeping supine and avoid sedating medications.

## 2014-04-24 NOTE — Assessment & Plan Note (Addendum)
Acid reflux.  On nexium.  Follow.

## 2014-04-24 NOTE — Assessment & Plan Note (Signed)
Being followed by pain clinic.  Gets her pain meds through them.  Still with increased pain.   Now on disability.

## 2014-04-24 NOTE — Assessment & Plan Note (Signed)
She is contemplating bariatric surgery.  Follow.  Have discussed diet, exercise and weight loss.

## 2014-04-24 NOTE — Assessment & Plan Note (Signed)
On atorvastatin now.  Tolerating.  Low cholesterol diet and exercise.  Follow lipid panel and liver function.

## 2014-04-24 NOTE — Progress Notes (Signed)
Subjective:    Patient ID: Kathleen Everett, female    DOB: Nov 23, 1970, 43 y.o.   MRN: [id number]  HPI 43 year old female with past history of allergy problems, bladder ulcers with previous urological w/up requiring pubovaginal sling, depression, hypercholesterolemia and s/p chiari decompression.  She comes in today to follow up on these issues as well as for a complete physical exam.   She was diagnosed with sleep apnea.  Had both obstructive and central sleep apnea.  Had a redo test off pain medication and the central apnea resolved.  She had previously not followed through with the CPAP titration.  Recently had f/u sleep study.  Scheduled for CPAP titration.  Need results.   Have discussed the need to avoid sleeping supine and avoid sedating medications.  She continues to have back pain.  Has been followed at pain clinic.  States she is unable to work secondary to the increased pain and decreased function.  Now on disability.    She also reports persistent headaches.  Light bothers her.  Some dizziness.  Had discussed further w/up (especially given the history of chiari malformation).  She had previously declined.  Recently was evaluated by her neurosurgeon (Dr Cristopher Peru). States MRI - ok.  Headaches occur daily to qod.  Discussed at length with her today.  Discussed neurology referral especially given persistent headaches and dizziness.  No chest pain or tightness.  Is followed by Dr Lavera Guise.   Recent tests per her report - negative.  No sob.  No cough or congestion.  Still having right ankle pain.  Request referral to ortho.   She is contemplating having bariatric surgery.       Past Medical History  Diagnosis Date  . Hyperlipidemia   . Chronic tension headaches   . Dysmenorrhea   . Hidradenitis   . Endometriosis   . Depression   . Osteoarthritis   . Obesity   . Ovarian cyst     hx  . GERD (gastroesophageal reflux disease)   . Chiari malformation     s/p sgy decompression    Current  Outpatient Prescriptions on File Prior to Visit  Medication Sig Dispense Refill  . acetaminophen (TYLENOL) 500 MG tablet Take 500 mg by mouth 2 (two) times daily.      Marland Kitchen atorvastatin (LIPITOR) 20 MG tablet Take 1 tablet (20 mg total) by mouth daily.  30 tablet  1  . buPROPion (WELLBUTRIN SR) 200 MG 12 hr tablet Take 1 tablet (200 mg total) by mouth daily.  30 tablet  3  . cyanocobalamin (,VITAMIN B-12,) 1000 MCG/ML injection INJECT 1 ML IM EACH MONTH  1 mL  5  . esomeprazole (NEXIUM) 40 MG capsule Take 1 capsule (40 mg total) by mouth daily at 12 noon.  30 capsule  5  . estradiol (ESTRACE) 1 MG tablet TAKE ONE TABLET BY MOUTH ONCE DAILY  30 tablet  5  . lidocaine (LIDODERM) 5 % Place 1 patch onto the skin daily. Remove & Discard patch within 12 hours or as directed by MD      . Nutritional Supplements (JUICE PLUS FIBRE PO) Take by mouth.      . ondansetron (ZOFRAN) 4 MG tablet Take 4 mg by mouth every 8 (eight) hours as needed for nausea or vomiting.      . Probiotic Product (PROBIOTIC DAILY PO) Take by mouth.       No current facility-administered medications on file prior to visit.    Review  of Systems Patient reports some lightheadedness.  Still with headaches.   No palpitations.  No chest pain or discomfort.  No increased shortness of breath, cough or congestion.  Feels breathing is stable.  No nausea.  No vomiting.  No increased abdominal pain.  Eating and drinking ok.  No bowel change, such as diarrhea, constipation, BRBPR or melana.   Has chronic back pain as outlined.  Limits her mobility and function.  Now being followed at pain clinic.  Contemplating bariatric surgery.  Persistent right ankle pain.        Objective:   Physical Exam  Filed Vitals:   04/19/14 1337  BP: 120/82  Pulse: 92  Temp: 98.4 F (36.9 C)   Blood pressure recheck:  75/44  43 year old female in no acute distress.   HEENT:  Nares- clear.  Oropharynx - without lesions. NECK:  Supple.  Nontender.  No  audible bruit.  HEART:  Appears to be regular. LUNGS:  No crackles or wheezing audible.  Respirations even and unlabored.  RADIAL PULSE:  Equal bilaterally.    BREASTS:  No nipple discharge or nipple retraction present.  Could not appreciate any distinct nodules or axillary adenopathy.  ABDOMEN:  Soft, nontender.  Bowel sounds present and normal.  No audible abdominal bruit.  GU:  Not performed.     EXTREMITIES:  No increased edema present.  DP pulses palpable and equal bilaterally.          Assessment & Plan:  PSYCH.  Has been on wellbutrin.  Was seeing Dr Clovis Riley.  Was referred to Dr Kasandra Knudsen.  Not seeing anyone now.  She feels things are stable.  Follow.   GYN.  S/p BSO.  Was seeing Dr Rayford Halsted.    PREVIOUS DOCUMENTED ELEVATED BLOOD PRESSURE.  Blood pressure doing well.  On no medication.  Follow.   BREAST NODULE.  Mammogram 1/12 revealed a smoothly marginated nodule.  Mammogram read as a BiRADS II.  Recommended yearly follow up.  She just had f/u mammogram 10/18/13 - Birads I.     HEALTH MAINTENANCE.  Physical today.   Mammogram as outlined.     I spent over 25 minutes with the patient and more than 50% of the time was spent in consultation regarding the above.

## 2014-04-24 NOTE — Assessment & Plan Note (Addendum)
Has a documented history of chronic headaches.  Has a history of a chiari malformation.  Is s/p chiari decompression.  Followed by neurosurgery - (Dr Cristopher Peru - [phone]).  Recent MRI negative.  Given persistent headaches and dizziness, will refer to neurology for further evaluation and recommendations.  Start magnesium.

## 2014-04-24 NOTE — Assessment & Plan Note (Signed)
Saw Dr Lavera Guise.  Had ECHO - [facility].  Last visit reported chest pain.  Recently evaluated and worked up by Dr Lavera Guise.  States everything checked out fine.  Obtain records.

## 2014-04-24 NOTE — Assessment & Plan Note (Signed)
Persistent.  Check xray of right ankle.

## 2014-04-25 ENCOUNTER — Encounter: Payer: Self-pay | Admitting: Internal Medicine

## 2014-04-25 DIAGNOSIS — M25579 Pain in unspecified ankle and joints of unspecified foot: Secondary | ICD-10-CM

## 2014-04-25 DIAGNOSIS — M79673 Pain in unspecified foot: Secondary | ICD-10-CM

## 2014-04-26 NOTE — Telephone Encounter (Signed)
Order placed for podiatry referral.   

## 2014-04-29 MED ORDER — MUPIROCIN 2 % EX OINT: 1.0000 "application " | TOPICAL_OINTMENT | Freq: Two times a day (BID) | CUTANEOUS | Status: DC

## 2014-04-29 NOTE — Telephone Encounter (Signed)
rx sent in for bactroban ointment instead of cream.

## 2014-04-29 NOTE — Addendum Note (Signed)
Addended by: Alisa Graff on: 04/29/2014 04:46 PM   Modules accepted: Orders

## 2014-05-13 ENCOUNTER — Encounter: Payer: Self-pay | Admitting: Internal Medicine

## 2014-05-17 ENCOUNTER — Encounter: Payer: Self-pay | Admitting: Internal Medicine

## 2014-06-21 ENCOUNTER — Encounter: Payer: Self-pay | Admitting: Internal Medicine

## 2014-06-30 ENCOUNTER — Other Ambulatory Visit: Payer: Self-pay | Admitting: Adult Health

## 2014-06-30 ENCOUNTER — Encounter: Payer: Self-pay | Admitting: Adult Health

## 2014-06-30 ENCOUNTER — Ambulatory Visit (INDEPENDENT_AMBULATORY_CARE_PROVIDER_SITE_OTHER): Payer: Medicaid Other | Admitting: Adult Health

## 2014-06-30 VITALS — BP 128/84 | HR 83 | Temp 98.7°F | Resp 14 | Ht 69.5 in | Wt 270.8 lb

## 2014-06-30 DIAGNOSIS — K219 Gastro-esophageal reflux disease without esophagitis: Secondary | ICD-10-CM

## 2014-06-30 DIAGNOSIS — K649 Unspecified hemorrhoids: Secondary | ICD-10-CM

## 2014-06-30 DIAGNOSIS — R3989 Other symptoms and signs involving the genitourinary system: Secondary | ICD-10-CM

## 2014-06-30 DIAGNOSIS — R110 Nausea: Secondary | ICD-10-CM

## 2014-06-30 DIAGNOSIS — R1032 Left lower quadrant pain: Secondary | ICD-10-CM

## 2014-06-30 DIAGNOSIS — K648 Other hemorrhoids: Secondary | ICD-10-CM

## 2014-06-30 DIAGNOSIS — E7800 Pure hypercholesterolemia, unspecified: Secondary | ICD-10-CM

## 2014-06-30 LAB — POCT URINALYSIS DIPSTICK
Bilirubin, UA: NEGATIVE
Glucose, UA: NEGATIVE
KETONES UA: NEGATIVE
Leukocytes, UA: NEGATIVE
Nitrite, UA: POSITIVE
RBC UA: NEGATIVE
SPEC GRAV UA: 1.02
Urobilinogen, UA: 1
pH, UA: 6

## 2014-06-30 MED ORDER — METRONIDAZOLE 500 MG PO TABS: 500.0000 mg | ORAL_TABLET | Freq: Three times a day (TID) | ORAL | Status: DC

## 2014-06-30 MED ORDER — PHENAZOPYRIDINE HCL 200 MG PO TABS: 200.0000 mg | ORAL_TABLET | Freq: Three times a day (TID) | ORAL | Status: DC | PRN

## 2014-06-30 MED ORDER — CIPROFLOXACIN HCL 500 MG PO TABS: 500.0000 mg | ORAL_TABLET | Freq: Two times a day (BID) | ORAL | Status: DC

## 2014-06-30 MED ORDER — ONDANSETRON HCL 4 MG PO TABS: 4.0000 mg | ORAL_TABLET | Freq: Three times a day (TID) | ORAL | Status: DC | PRN

## 2014-06-30 MED ORDER — HYDROCORTISONE ACETATE 25 MG RE SUPP: 25.0000 mg | Freq: Two times a day (BID) | RECTAL | Status: DC

## 2014-06-30 NOTE — Progress Notes (Signed)
Patient ID: Kathleen Everett, female   DOB: [birth year], 43 y.o.   MRN: [id number]    Subjective:    Patient ID: Kathleen Everett, female    DOB: 07/11/1971, 43 y.o.   MRN: [id number]  HPI  Pt is a 43 y/o female with hx of diverticulitis who presents to clinic with problems with abdominal pain, diarrhea, nausea that has been intermittently ongoing for approximate 5 weeks. She has had some left over Cipro and Flagyl which she has taken (incorrectly). She has had some nausea without vomiting. Decreased appetite. Reports having bladder pain, spasms.  Hemorrhoids are aggravated from the diarrhea. Out of medication for same. Reports these are very uncomfortable.    Past Medical History  Diagnosis Date  . Hyperlipidemia   . Chronic tension headaches   . Dysmenorrhea   . Hidradenitis   . Endometriosis   . Depression   . Osteoarthritis   . Obesity   . Ovarian cyst     hx  . GERD (gastroesophageal reflux disease)   . Chiari malformation     s/p sgy decompression    Current Outpatient Prescriptions on File Prior to Visit  Medication Sig Dispense Refill  . atorvastatin (LIPITOR) 20 MG tablet Take 1 tablet (20 mg total) by mouth daily.  30 tablet  1  . buPROPion (WELLBUTRIN SR) 200 MG 12 hr tablet Take 1 tablet (200 mg total) by mouth daily.  30 tablet  3  . cyanocobalamin (,VITAMIN B-12,) 1000 MCG/ML injection INJECT 1 ML IM EACH MONTH  1 mL  5  . esomeprazole (NEXIUM) 40 MG capsule Take 1 capsule (40 mg total) by mouth daily at 12 noon.  30 capsule  5  . estradiol (ESTRACE) 1 MG tablet TAKE ONE TABLET BY MOUTH ONCE DAILY  30 tablet  5  . lidocaine (LIDODERM) 5 % Place 1 patch onto the skin daily. Remove & Discard patch within 12 hours or as directed by MD      . Magnesium Oxide 400 (240 MG) MG TABS One tablet daily  30 tablet  1  . Nutritional Supplements (JUICE PLUS FIBRE PO) Take by mouth.      . oxyCODONE-acetaminophen (PERCOCET) 10-325 MG per tablet Take 1 tablet by mouth every 8  (eight) hours as needed for pain.       . Probiotic Product (PROBIOTIC DAILY PO) Take by mouth.       No current facility-administered medications on file prior to visit.     Review of Systems  Constitutional: Negative for fever and chills.  Gastrointestinal: Positive for nausea, abdominal pain and diarrhea. Negative for vomiting, constipation and blood in stool.       Hx of diverticulitis  Genitourinary: Positive for dysuria.       Bladder discomfort       Objective:  BP 128/84  Pulse 83  Temp(Src) 98.7 F (37.1 C) (Oral)  Resp 14  Ht 5' 9.5" (1.765 m)  Wt 270 lb 12 oz (122.811 kg)  BMI 39.42 kg/m2  SpO2 98%  LMP 12/08/2006   Physical Exam  Constitutional: She is oriented to person, place, and time. No distress.  Cardiovascular: Normal rate and regular rhythm.   Pulmonary/Chest: Effort normal. No respiratory distress.  Abdominal: Soft. She exhibits no distension and no mass. There is tenderness (LLQ, umbilicus). There is guarding. There is no rebound.  Hypoactive bowels  Musculoskeletal: Normal range of motion.  Neurological: She is alert and oriented to person, place, and time.  Skin:  Skin is warm and dry.  Psychiatric: She has a normal mood and affect. Her behavior is normal. Judgment and thought content normal.      Assessment & Plan:   1. Abdominal pain, left lower quadrant Hx of diverticulitis. She has been taking antibiotics here and there without completing any course. I am check labs and starting her on cipro and flagly for diverticulitis flare up. Clear liquids for 24-48 hours. Then BRAT diet - CBC with Differential - Basic metabolic panel - Hepatic function panel - ciprofloxacin (CIPRO) 500 MG tablet; Take 1 tablet (500 mg total) by mouth 2 (two) times daily.  Dispense: 20 tablet; Refill: 0 - metroNIDAZOLE (FLAGYL) 500 MG tablet; Take 1 tablet (500 mg total) by mouth 3 (three) times daily.  Dispense: 30 tablet; Refill: 0  2. Nausea Zofran as needed -  ondansetron (ZOFRAN) 4 MG tablet; Take 1 tablet (4 mg total) by mouth every 8 (eight) hours as needed for nausea or vomiting.  Dispense: 20 tablet; Refill: 1  3. Other hemorrhoids Prescribe suppositories for her hemorrhoids - hydrocortisone (ANUSOL-HC) 25 MG suppository; Place 1 suppository (25 mg total) rectally 2 (two) times daily.  Dispense: 12 suppository; Refill: 0  4. Bladder pain Pyridium and check ua, culture - POCT urinalysis dipstick

## 2014-06-30 NOTE — Patient Instructions (Signed)
  Clear Liquid Diet for 24-48 hours then follow the BRATs diet as we discussed.  Please start Cipro and Flagyl as directed below. - ciprofloxacin (CIPRO) 500 MG tablet; Take 1 tablet (500 mg total) by mouth 2 (two) times daily.  Dispense: 20 tablet; Refill: 0 - metroNIDAZOLE (FLAGYL) 500 MG tablet; Take 1 tablet (500 mg total) by mouth 3 (three) times daily.  Dispense: 30 tablet; Refill: 0  For Nausea - ondansetron (ZOFRAN) 4 MG tablet; Take 1 tablet (4 mg total) by mouth every 8 (eight) hours as needed for nausea or vomiting.  Dispense: 20 tablet; Refill: 1  Hemorrhoids - hydrocortisone (ANUSOL-HC) 25 MG suppository; Place 1 suppository (25 mg total) rectally 2 (two) times daily.  Dispense: 12 suppository; Refill: 0  Bladder pain Provide a urine sample for urinalysis.  Pyridium 200 mg 1 tablet three times a day as needed for bladder pain, spasms.

## 2014-07-01 ENCOUNTER — Ambulatory Visit: Payer: Medicaid Other | Admitting: Adult Health

## 2014-07-01 LAB — LIPID PANEL
CHOL/HDL RATIO: 6
Cholesterol: 217 mg/dL — ABNORMAL HIGH (ref 0–200)
HDL: 38.7 mg/dL — ABNORMAL LOW (ref >39.00)
NonHDL: 178.3
TRIGLYCERIDES: 295 mg/dL — AB (ref 0.0–149.0)
VLDL: 59 mg/dL — AB (ref 0.0–40.0)

## 2014-07-01 LAB — CBC WITH DIFFERENTIAL/PLATELET
Basophils Absolute: 0 10*3/uL (ref 0.0–0.1)
Basophils Relative: 0.4 % (ref 0.0–3.0)
EOS PCT: 2 % (ref 0.0–5.0)
Eosinophils Absolute: 0.1 10*3/uL (ref 0.0–0.7)
HEMATOCRIT: 41.1 % (ref 36.0–46.0)
Hemoglobin: 13.7 g/dL (ref 12.0–15.0)
LYMPHS ABS: 2 10*3/uL (ref 0.7–4.0)
Lymphocytes Relative: 29 % (ref 12.0–46.0)
MCHC: 33.3 g/dL (ref 30.0–36.0)
MCV: 87.8 fl (ref 78.0–100.0)
MONOS PCT: 4.4 % (ref 3.0–12.0)
Monocytes Absolute: 0.3 10*3/uL (ref 0.1–1.0)
Neutro Abs: 4.5 10*3/uL (ref 1.4–7.7)
Neutrophils Relative %: 64.2 % (ref 43.0–77.0)
Platelets: 257 10*3/uL (ref 150.0–400.0)
RBC: 4.68 Mil/uL (ref 3.87–5.11)
RDW: 14.6 % (ref 11.5–15.5)
WBC: 7 10*3/uL (ref 4.0–10.5)

## 2014-07-01 LAB — HEPATIC FUNCTION PANEL
ALT: 25 U/L (ref 0–35)
AST: 21 U/L (ref 0–37)
Albumin: 3.9 g/dL (ref 3.5–5.2)
Alkaline Phosphatase: 93 U/L (ref 39–117)
Bilirubin, Direct: 0.1 mg/dL (ref 0.0–0.3)
TOTAL PROTEIN: 6.6 g/dL (ref 6.0–8.3)
Total Bilirubin: 0.3 mg/dL (ref 0.2–1.2)

## 2014-07-01 LAB — BASIC METABOLIC PANEL
BUN: 10 mg/dL (ref 6–23)
CHLORIDE: 105 meq/L (ref 96–112)
CO2: 26 mEq/L (ref 19–32)
Calcium: 9.7 mg/dL (ref 8.4–10.5)
Creatinine, Ser: 0.7 mg/dL (ref 0.4–1.2)
GFR: 104.89 mL/min (ref >60.00)
Glucose, Bld: 99 mg/dL (ref 70–99)
POTASSIUM: 3.7 meq/L (ref 3.5–5.1)
Sodium: 137 mEq/L (ref 135–145)

## 2014-07-01 LAB — LDL CHOLESTEROL, DIRECT: Direct LDL: 152.6 mg/dL

## 2014-07-02 ENCOUNTER — Encounter: Payer: Self-pay | Admitting: Internal Medicine

## 2014-07-06 NOTE — Telephone Encounter (Signed)
Left message, notifying of Dr.Scott's response: Please call pt to confirm symptoms. Want to make sure no obstruction. If no bowel movement, can try a suppository of enema to see if can get things moving. If not success with one enema, can repeat in 4 hours. If increasing pain or changing pain, etc, then will need reevaluation. Already on abx. If acute symptoms, then acute care today.

## 2014-07-12 ENCOUNTER — Other Ambulatory Visit: Payer: Self-pay | Admitting: Adult Health

## 2014-07-12 DIAGNOSIS — R1032 Left lower quadrant pain: Secondary | ICD-10-CM

## 2014-07-12 NOTE — Progress Notes (Signed)
Please call patient and let her know I have ordered a CT of the abdomen. Has her pain improved at all? Did she finish all her antibiotic?  I am looking into the culture. It is still showing as pending.

## 2014-07-13 ENCOUNTER — Telehealth: Payer: Self-pay | Admitting: *Deleted

## 2014-07-13 NOTE — Progress Notes (Signed)
I think I already sent this to you. Pt with ongoing abdominal pain and needs CT

## 2014-07-13 NOTE — Telephone Encounter (Signed)
Pt is coming in tomorrow what labs an dx?

## 2014-07-13 NOTE — Telephone Encounter (Signed)
Please call pt and notify her that when she saw Raquel - labs were done on that day.  Does not need to come in tomorrow for labs.  Thanks    Dr Nicki Reaper

## 2014-07-13 NOTE — Telephone Encounter (Signed)
Pt was notified.  

## 2014-07-13 NOTE — Progress Notes (Signed)
Spoke to patient per Raquel's request: Patient states that she is feeling a little better but still having pain in the bladder area on the lower left side but its not as bad as it was 2 days ago. She also stated that she finished the ABT. She is agreeable to doing the CT scan at this time.

## 2014-07-14 ENCOUNTER — Other Ambulatory Visit: Payer: Medicaid Other

## 2014-07-20 ENCOUNTER — Encounter: Payer: Self-pay | Admitting: Internal Medicine

## 2014-07-20 ENCOUNTER — Ambulatory Visit (INDEPENDENT_AMBULATORY_CARE_PROVIDER_SITE_OTHER): Payer: Medicaid Other | Admitting: Internal Medicine

## 2014-07-20 VITALS — BP 120/70 | HR 80 | Temp 98.6°F | Ht 69.5 in | Wt 271.5 lb

## 2014-07-20 DIAGNOSIS — I059 Rheumatic mitral valve disease, unspecified: Secondary | ICD-10-CM

## 2014-07-20 DIAGNOSIS — M549 Dorsalgia, unspecified: Secondary | ICD-10-CM

## 2014-07-20 DIAGNOSIS — E7800 Pure hypercholesterolemia, unspecified: Secondary | ICD-10-CM

## 2014-07-20 DIAGNOSIS — R1084 Generalized abdominal pain: Secondary | ICD-10-CM

## 2014-07-20 DIAGNOSIS — E669 Obesity, unspecified: Secondary | ICD-10-CM

## 2014-07-20 DIAGNOSIS — K219 Gastro-esophageal reflux disease without esophagitis: Secondary | ICD-10-CM

## 2014-07-20 DIAGNOSIS — G8929 Other chronic pain: Secondary | ICD-10-CM

## 2014-07-20 DIAGNOSIS — R300 Dysuria: Secondary | ICD-10-CM

## 2014-07-20 DIAGNOSIS — I341 Nonrheumatic mitral (valve) prolapse: Secondary | ICD-10-CM

## 2014-07-20 DIAGNOSIS — G4730 Sleep apnea, unspecified: Secondary | ICD-10-CM

## 2014-07-20 DIAGNOSIS — R51 Headache: Secondary | ICD-10-CM

## 2014-07-20 LAB — URINALYSIS, ROUTINE W REFLEX MICROSCOPIC
Bilirubin Urine: NEGATIVE
HGB URINE DIPSTICK: NEGATIVE
Ketones, ur: NEGATIVE
LEUKOCYTES UA: NEGATIVE
NITRITE: POSITIVE — AB
RBC / HPF: NONE SEEN (ref 0–?)
Specific Gravity, Urine: <=1.005 — AB (ref 1.000–1.030)
Total Protein, Urine: NEGATIVE
Urine Glucose: NEGATIVE
Urobilinogen, UA: 0.2 (ref 0.0–1.0)
pH: 7 (ref 5.0–8.0)

## 2014-07-20 LAB — CBC WITH DIFFERENTIAL/PLATELET
BASOS ABS: 0 10*3/uL (ref 0.0–0.1)
Basophils Relative: 0.3 % (ref 0.0–3.0)
Eosinophils Absolute: 0.1 10*3/uL (ref 0.0–0.7)
Eosinophils Relative: 1.5 % (ref 0.0–5.0)
HEMATOCRIT: 38 % (ref 36.0–46.0)
Hemoglobin: 12.9 g/dL (ref 12.0–15.0)
Lymphocytes Relative: 24.5 % (ref 12.0–46.0)
Lymphs Abs: 1.6 10*3/uL (ref 0.7–4.0)
MCHC: 34 g/dL (ref 30.0–36.0)
MCV: 85.4 fl (ref 78.0–100.0)
MONO ABS: 0.3 10*3/uL (ref 0.1–1.0)
MONOS PCT: 4.5 % (ref 3.0–12.0)
NEUTROS PCT: 69.2 % (ref 43.0–77.0)
Neutro Abs: 4.5 10*3/uL (ref 1.4–7.7)
PLATELETS: 258 10*3/uL (ref 150.0–400.0)
RBC: 4.44 Mil/uL (ref 3.87–5.11)
RDW: 13.7 % (ref 11.5–15.5)
WBC: 6.6 10*3/uL (ref 4.0–10.5)

## 2014-07-20 LAB — LIPASE: Lipase: 27 U/L (ref 11.0–59.0)

## 2014-07-20 LAB — AMYLASE: AMYLASE: 41 U/L (ref 27–131)

## 2014-07-20 NOTE — Progress Notes (Signed)
Pre visit review using our clinic review tool, if applicable. No additional management support is needed unless otherwise documented below in the visit note. 

## 2014-07-20 NOTE — Progress Notes (Signed)
Subjective:    Patient ID: Kathleen Everett, female    DOB: 1970-12-14, 43 y.o.   MRN: [id number]  HPI 43 year old female with past history of allergy problems, bladder ulcers with previous urological w/up requiring pubovaginal sling, depression, hypercholesterolemia and s/p chiari decompression.  She comes in today for a scheduled follow up.   She was diagnosed with sleep apnea.  Had both obstructive and central sleep apnea.  Had a redo test off pain medication and the central apnea resolved.  She had previously not followed through with the CPAP titration.  Recently had f/u sleep study.  Using CPAP now.  She continues to have back pain.  Has been followed at pain clinic.  States she is unable to work secondary to the increased pain and decreased function.  Now on disability.   She request referral back to pain clinic for further evaluation and treatment.  She also has persistent headaches.  Had discussed further w/up (especially given the history of chiari malformation).   Recently was evaluated by her neurosurgeon (Dr Cristopher Peru). States MRI - ok.   She saw Dr Manuella Ghazi.  On topamax and uses imitrex prn.  Due to f/u soon with Dr Manuella Ghazi.   No chest pain or tightness.  Is followed by Dr Lavera Guise.   Recent tests per her report - negative.  No sob.  No cough or congestion.  She was recently evalauted for abdominal pain and burning with urination and "bladder pain".  See Raquel's note for details.  Was treated with cipro and flagyl.  No change in pain.  She is scheduled for CT next week.  Bowels are soft.  No blood.        Past Medical History  Diagnosis Date  . Hyperlipidemia   . Chronic tension headaches   . Dysmenorrhea   . Hidradenitis   . Endometriosis   . Depression   . Osteoarthritis   . Obesity   . Ovarian cyst     hx  . GERD (gastroesophageal reflux disease)   . Chiari malformation     s/p sgy decompression    Current Outpatient Prescriptions on File Prior to Visit  Medication Sig Dispense  Refill  . atorvastatin (LIPITOR) 20 MG tablet Take 1 tablet (20 mg total) by mouth daily.  30 tablet  1  . buPROPion (WELLBUTRIN SR) 200 MG 12 hr tablet Take 1 tablet (200 mg total) by mouth daily.  30 tablet  3  . cholecalciferol (VITAMIN D) 1000 UNITS tablet Take 2,000 Units by mouth daily.      . cyanocobalamin (,VITAMIN B-12,) 1000 MCG/ML injection INJECT 1 ML IM EACH MONTH  1 mL  5  . esomeprazole (NEXIUM) 40 MG capsule Take 1 capsule (40 mg total) by mouth daily at 12 noon.  30 capsule  5  . estradiol (ESTRACE) 1 MG tablet TAKE ONE TABLET BY MOUTH ONCE DAILY  30 tablet  5  . lidocaine (LIDODERM) 5 % Place 1 patch onto the skin daily. Remove & Discard patch within 12 hours or as directed by MD      . Magnesium Oxide 400 (240 MG) MG TABS One tablet daily  30 tablet  1  . Nutritional Supplements (JUICE PLUS FIBRE PO) Take by mouth.      . ondansetron (ZOFRAN) 4 MG tablet Take 1 tablet (4 mg total) by mouth every 8 (eight) hours as needed for nausea or vomiting.  20 tablet  1  . oxyCODONE-acetaminophen (PERCOCET) 10-325 MG per  tablet Take 1 tablet by mouth every 8 (eight) hours as needed for pain.       . phenazopyridine (PYRIDIUM) 200 MG tablet Take 1 tablet (200 mg total) by mouth 3 (three) times daily as needed for pain.  10 tablet  0  . Probiotic Product (PROBIOTIC DAILY PO) Take by mouth.      . SUMAtriptan (IMITREX) 50 MG tablet Take 50 mg by mouth as needed for migraine or headache. May repeat in 2 hours if headache persists or recurs.      . topiramate (TOPAMAX) 50 MG tablet Take 50 mg by mouth 2 (two) times daily.       No current facility-administered medications on file prior to visit.    Review of Systems Still with headaches.   Seeing Dr Manuella Ghazi.  On topamax and taking imitrex prn.  No palpitations.  No chest pain or discomfort.  No increased shortness of breath, cough or congestion.  Feels breathing is stable.  No significant nausea.  No vomiting.  Increased abdominal pain and  "bladder pain" as outlined.  Eating and drinking ok.  No bowel change, such as diarrhea, constipation, BRBPR or melana.   Does report some soft stool.  Has chronic back pain as outlined.  Limits her mobility and function.  Now being followed at pain clinic.  Wants to return to pain clinic here.  Using CPAP.         Objective:   Physical Exam  Filed Vitals:   07/20/14 1024  BP: 120/70  Pulse: 80  Temp: 98.6 F (23 C)   43 year old female in no acute distress.   HEENT:  Nares- clear.  Oropharynx - without lesions. NECK:  Supple.  Nontender.  No audible bruit.  HEART:  Appears to be regular. LUNGS:  No crackles or wheezing audible.  Respirations even and unlabored.  RADIAL PULSE:  Equal bilaterally.  ABDOMEN:  Soft, nontender.  Bowel sounds present and normal.  No audible abdominal bruit.   EXTREMITIES:  No increased edema present.  DP pulses palpable and equal bilaterally.          Assessment & Plan:  PSYCH.  Has been on wellbutrin.  Was seeing Dr Clovis Riley.  Was referred to Dr Kasandra Knudsen.  Not seeing anyone now.  She feels things are stable.  Follow.   GYN.  S/p BSO.  Was seeing Dr Rayford Halsted.    PREVIOUS DOCUMENTED ELEVATED BLOOD PRESSURE.  Blood pressure doing well.  On no medication.  Follow.   BREAST NODULE.  Mammogram 1/12 revealed a smoothly marginated nodule.  Mammogram read as a BiRADS II.  Recommended yearly follow up.  She just had f/u mammogram 10/18/13 - Birads I.   Schedule f/u mammogram when due.  HEALTH MAINTENANCE.  Physical 04/19/14.   Mammogram as outlined.   Has had GI evaluation.   I spent over 25 minutes with the patient and more than 50% of the time was spent in consultation regarding the above.

## 2014-07-21 ENCOUNTER — Other Ambulatory Visit: Payer: Self-pay | Admitting: *Deleted

## 2014-07-21 ENCOUNTER — Encounter: Payer: Self-pay | Admitting: Internal Medicine

## 2014-07-21 DIAGNOSIS — R300 Dysuria: Secondary | ICD-10-CM

## 2014-07-21 DIAGNOSIS — R3989 Other symptoms and signs involving the genitourinary system: Secondary | ICD-10-CM

## 2014-07-21 LAB — CULTURE, URINE COMPREHENSIVE
Colony Count: NO GROWTH
Organism ID, Bacteria: NO GROWTH

## 2014-07-21 MED ORDER — ATORVASTATIN CALCIUM 20 MG PO TABS: 20.0000 mg | ORAL_TABLET | Freq: Every day | ORAL | Status: DC

## 2014-07-22 ENCOUNTER — Ambulatory Visit: Payer: Medicaid Other | Admitting: Internal Medicine

## 2014-07-24 ENCOUNTER — Encounter: Payer: Self-pay | Admitting: Internal Medicine

## 2014-07-24 DIAGNOSIS — R109 Unspecified abdominal pain: Secondary | ICD-10-CM

## 2014-07-24 HISTORY — DX: Unspecified abdominal pain: R10.9

## 2014-07-24 MED ORDER — ESOMEPRAZOLE MAGNESIUM 40 MG PO CPDR: 40.0000 mg | DELAYED_RELEASE_CAPSULE | Freq: Two times a day (BID) | ORAL | Status: DC

## 2014-07-24 NOTE — Assessment & Plan Note (Addendum)
Persistent.  No change with abx.  Bowels moving.  Soft.  Scheduled for CT next week.  Keep appt.  Further w/up pending results.  With the burring, etc, will check urinalysis to confirm no infection.   Will increase nexium to bid.

## 2014-07-24 NOTE — Assessment & Plan Note (Signed)
Using CPAP.  Follow.  

## 2014-07-24 NOTE — Assessment & Plan Note (Signed)
Being followed by pain clinic.  Gets her pain meds through them.  Still with increased pain.   Now on disability.  Wants to change to local pain clinic.

## 2014-07-24 NOTE — Assessment & Plan Note (Signed)
Has a documented history of chronic headaches.  Has a history of a chiari malformation.  Is s/p chiari decompression.  Followed by neurosurgery - (Dr Cristopher Peru - [phone]).  Recent MRI negative.  Given persistent headaches and dizziness, was referred to neurology for further evaluation and recommendations.  On topamax and using imitrex prn.  Continue f/u with Dr Manuella Ghazi.

## 2014-07-24 NOTE — Assessment & Plan Note (Signed)
Acid reflux.  On nexium.  Follow.

## 2014-07-24 NOTE — Assessment & Plan Note (Signed)
Saw Dr Lavera Guise.  Had ECHO - [facility].  Last visit reported chest pain.  Recently evaluated and worked up by Dr Lavera Guise.  States everything checked out fine.

## 2014-07-24 NOTE — Assessment & Plan Note (Signed)
Discussed diet, exercise and weight loss.  She is contemplating weight loss surgery.

## 2014-07-24 NOTE — Assessment & Plan Note (Signed)
On atorvastatin now.  Tolerating.  Low cholesterol diet and exercise.  Follow lipid panel and liver function.

## 2014-07-28 NOTE — Telephone Encounter (Signed)
Order placed for urology referral.  My chart message sent to pt.

## 2014-08-08 ENCOUNTER — Encounter: Payer: Self-pay | Admitting: Internal Medicine

## 2014-08-08 ENCOUNTER — Telehealth: Payer: Self-pay | Admitting: Internal Medicine

## 2014-08-08 DIAGNOSIS — M545 Low back pain: Secondary | ICD-10-CM

## 2014-08-08 NOTE — Telephone Encounter (Signed)
[facility] will burn the CD & have it ready for pick up tomorrow (I gave them Sean's name for pick up)

## 2014-08-08 NOTE — Telephone Encounter (Signed)
Hilliard Clark notified that disc will be ready for pick up.

## 2014-08-08 NOTE — Telephone Encounter (Signed)
Kathleen Everett saw a pt of mine prior to leaving.  She ordered a CT scan.  I reviewed CT.  I called [facility] (cancer center).  He is my contact person when I need Dr Oliva Bustard or Dr Faith Rogue to review a scan.  The scan was done at [facility].  They are not able to view the scans that are done at [facility], but Hilliard Clark said that he would go by in the am and pick up the scan from [facility] - if they could make a disc (copy) for him.  Please call [facility] and inform them we need a copy on disc of her 07/29/14 CT scan for Melinda Crutch to pick up tomorrow for review.  Let me know if ok to do and I will call Hilliard Clark and let him know.  Thanks.

## 2014-08-23 ENCOUNTER — Telehealth: Payer: Self-pay | Admitting: Internal Medicine

## 2014-08-23 NOTE — Telephone Encounter (Signed)
Pt sent a my chart message regarding some pain in breast and persistent abdominal issues and nausea.  Was informed needed a f/u appt with me.  See if she can come in on 08/31/14 (Wednesday) at 12:30.  Thanks.  Confirm ok to wait until then.  If acute issues, to acute care.

## 2014-08-24 NOTE — Telephone Encounter (Signed)
Sent response back via mychart with appt

## 2014-08-29 NOTE — Telephone Encounter (Signed)
Order placed for referral to Dr Brien Few.  My chart message sent to pt.

## 2014-08-31 ENCOUNTER — Ambulatory Visit (INDEPENDENT_AMBULATORY_CARE_PROVIDER_SITE_OTHER): Payer: Medicaid Other | Admitting: Internal Medicine

## 2014-08-31 ENCOUNTER — Encounter: Payer: Self-pay | Admitting: Internal Medicine

## 2014-08-31 VITALS — BP 130/80 | HR 86 | Temp 98.3°F | Ht 69.5 in | Wt 272.2 lb

## 2014-08-31 DIAGNOSIS — G8929 Other chronic pain: Secondary | ICD-10-CM

## 2014-08-31 DIAGNOSIS — M549 Dorsalgia, unspecified: Secondary | ICD-10-CM

## 2014-08-31 DIAGNOSIS — K219 Gastro-esophageal reflux disease without esophagitis: Secondary | ICD-10-CM

## 2014-08-31 DIAGNOSIS — R51 Headache: Secondary | ICD-10-CM

## 2014-08-31 DIAGNOSIS — R519 Headache, unspecified: Secondary | ICD-10-CM

## 2014-08-31 DIAGNOSIS — N644 Mastodynia: Secondary | ICD-10-CM

## 2014-08-31 DIAGNOSIS — R911 Solitary pulmonary nodule: Secondary | ICD-10-CM

## 2014-08-31 NOTE — Progress Notes (Signed)
Pre visit review using our clinic review tool, if applicable. No additional management support is needed unless otherwise documented below in the visit note. 

## 2014-09-02 ENCOUNTER — Other Ambulatory Visit: Payer: Self-pay

## 2014-09-08 DIAGNOSIS — N644 Mastodynia: Secondary | ICD-10-CM | POA: Insufficient documentation

## 2014-09-08 DIAGNOSIS — R911 Solitary pulmonary nodule: Secondary | ICD-10-CM | POA: Insufficient documentation

## 2014-09-08 HISTORY — DX: Mastodynia: N64.4

## 2014-09-08 NOTE — Assessment & Plan Note (Signed)
Acid reflux.  On nexium.  Controlled. Follow.

## 2014-09-08 NOTE — Assessment & Plan Note (Signed)
Has a documented history of chronic headaches.  Has a history of a chiari malformation.  Is s/p chiari decompression.  Followed by neurosurgery - (Dr Cristopher Peru - [phone]).  Recent MRI negative.  Given persistent headaches and dizziness, was referred to neurology for further evaluation and recommendations.  On topamax and using imitrex prn.  Continue f/u with Dr Manuella Ghazi.

## 2014-09-08 NOTE — Assessment & Plan Note (Signed)
Has been followed by pain clinic.  Still with increased pain.   Now on disability.  Has requested referral back to Dr Brien Few.

## 2014-09-08 NOTE — Progress Notes (Signed)
Subjective:    Patient ID: Kathleen Everett, female    DOB: 31-Jan-1971, 43 y.o.   MRN: [id number]  HPI 43 year old female with past history of allergy problems, bladder ulcers with previous urological w/up requiring pubovaginal sling, depression, hypercholesterolemia and s/p chiari decompression.  She comes in today as a work in with concerns regarding right breast pain.  States has been present for over two months. No nodule palpated.  No nipple discharge.   She continues to have back pain.  Has been followed at pain clinic.  States she is unable to work secondary to the increased pain and decreased function.  Now on disability.   She has requested referral back to Dr Brien Few for further evaluation and treatment.  She also has persistent headaches.  Had discussed further w/up (especially given the history of chiari malformation). Recently was evaluated by her neurosurgeon (Dr Cristopher Peru). MRI - ok.   She saw Dr Manuella Ghazi.  On topamax and uses imitrex prn.  No chest pain or tightness.  Is followed by Dr Lavera Guise.   Recent tests per her report - negative.  No sob.  No cough or congestion.  She was recently evalauted for abdominal pain. Had CT scan.  No CT findings in the abdomen and pelvis to explain her symptoms.  No blood.  Discussed referral back to GI.  Did have 22mm right middle lobe lung nodule.  Does not smoke.  Dr Faith Rogue reviewed.  Recommended f/u CT chest in one year.  She had questions about this today.     Past Medical History  Diagnosis Date  . Hyperlipidemia   . Chronic tension headaches   . Dysmenorrhea   . Hidradenitis   . Endometriosis   . Depression   . Osteoarthritis   . Obesity   . Ovarian cyst     hx  . GERD (gastroesophageal reflux disease)   . Chiari malformation     s/p sgy decompression    Current Outpatient Prescriptions on File Prior to Visit  Medication Sig Dispense Refill  . atorvastatin (LIPITOR) 20 MG tablet Take 1 tablet (20 mg total) by mouth daily.  30 tablet  5   . buPROPion (WELLBUTRIN SR) 200 MG 12 hr tablet Take 1 tablet (200 mg total) by mouth daily.  30 tablet  3  . cholecalciferol (VITAMIN D) 1000 UNITS tablet Take 2,000 Units by mouth daily.      . cyanocobalamin (,VITAMIN B-12,) 1000 MCG/ML injection INJECT 1 ML IM EACH MONTH  1 mL  5  . esomeprazole (NEXIUM) 40 MG capsule Take 1 capsule (40 mg total) by mouth 2 (two) times daily before a meal.  60 capsule  2  . estradiol (ESTRACE) 1 MG tablet TAKE ONE TABLET BY MOUTH ONCE DAILY  30 tablet  5  . lidocaine (LIDODERM) 5 % Place 1 patch onto the skin daily. Remove & Discard patch within 12 hours or as directed by MD      . Magnesium Oxide 400 (240 MG) MG TABS One tablet daily  30 tablet  1  . Nutritional Supplements (JUICE PLUS FIBRE PO) Take by mouth.      . ondansetron (ZOFRAN) 4 MG tablet Take 1 tablet (4 mg total) by mouth every 8 (eight) hours as needed for nausea or vomiting.  20 tablet  1  . oxyCODONE-acetaminophen (PERCOCET) 10-325 MG per tablet Take 1 tablet by mouth every 8 (eight) hours as needed for pain.       . phenazopyridine (  PYRIDIUM) 200 MG tablet Take 1 tablet (200 mg total) by mouth 3 (three) times daily as needed for pain.  10 tablet  0  . Probiotic Product (PROBIOTIC DAILY PO) Take by mouth.      . SUMAtriptan (IMITREX) 50 MG tablet Take 50 mg by mouth as needed for migraine or headache. May repeat in 2 hours if headache persists or recurs.      . topiramate (TOPAMAX) 50 MG tablet Take 50 mg by mouth 2 (two) times daily.       No current facility-administered medications on file prior to visit.    Review of Systems Still with headaches.   Seeing Dr Manuella Ghazi.  On topamax and taking imitrex prn.  No palpitations.  No chest pain or discomfort.  No increased shortness of breath, cough or congestion.  Feels breathing is stable.  No significant nausea.  No vomiting.  Previous evaluation for abdominal pain as outlined.  Appears to be better.  Eating and drinking ok.  No bowel change, such  as diarrhea, constipation, BRBPR or melana.   Has chronic back pain as outlined.  Limits her mobility and function.  Requested referral back to Dr Brien Few.  CT as outlined.  Right breast pain as outlined.           Objective:   Physical Exam  Filed Vitals:   08/31/14 1227  BP: 130/80  Pulse: 86  Temp: 98.3 F (110.77 C)   43 year old female in no acute distress.   HEENT:  Nares- clear.  Oropharynx - without lesions. NECK:  Supple.  Nontender.  No audible bruit.  HEART:  Appears to be regular. LUNGS:  No crackles or wheezing audible.  Respirations even and unlabored.  RADIAL PULSE:  Equal bilaterally. BREASTS:  No nipple discharge or nipple retraction present.  Increased discomfort right breast.  No axillary adenopathy appreciated.    ABDOMEN:  Soft, nontender.  Bowel sounds present and normal.  No audible abdominal bruit.   EXTREMITIES:  No increased edema present.  DP pulses palpable and equal bilaterally.          Assessment & Plan:  PSYCH.  Has been on wellbutrin.  Was seeing Dr Clovis Riley.  Was referred to Dr Kasandra Knudsen.  Not seeing anyone now.  She feels things are stable.  Follow.   GYN.  S/p BSO.  Was seeing Dr Rayford Halsted.    PREVIOUS DOCUMENTED ELEVATED BLOOD PRESSURE. Blood pressure doing well.  On no medication.  Follow.   BREAST NODULE.  Mammogram 1/12 revealed a smoothly marginated nodule.  Mammogram read as a BiRADS II.  Recommended yearly follow up.  She just had f/u mammogram 10/18/13 - Birads I.  Right breast pain.  Schedule diagnostic mammogram with right breast ultrasound.   HEALTH MAINTENANCE.  Physical 04/19/14.   Mammogram as outlined.   Has had GI evaluation.    Problem List Items Addressed This Visit   Breast pain, right     Pain as outlined.  Exam as outlined.  Schedule diagnostic mammogram with right breast ultrasound.       Relevant Orders      US BREAST COMPLETE UNI RIGHT INC AXILLA   Chronic back pain     Has been followed by pain clinic.  Still with increased pain.    Now on disability.  Has requested referral back to Dr Brien Few.        GERD (gastroesophageal reflux disease)     Acid reflux.  On nexium.  Controlled.  Follow.       Headache     Has a documented history of chronic headaches.  Has a history of a chiari malformation.  Is s/p chiari decompression.  Followed by neurosurgery - (Dr Cristopher Peru - [phone]).  Recent MRI negative.  Given persistent headaches and dizziness, was referred to neurology for further evaluation and recommendations.  On topamax and using imitrex prn.  Continue f/u with Dr Manuella Ghazi.        Lung nodule     Found incidentally on CT abdomen and pelvis.  Does not smoke.  Dr Faith Rogue reviewed.  Recommended f/u CT chest in one year.  Pt had questions regarding this.  Consider f/u CT in 6 months.        Other Visit Diagnoses   Breast pain, left    -  Primary    Relevant Orders       MM Digital Diagnostic Bilat      I spent over 25 minutes with the patient and more than 50% of the time was spent in consultation regarding the above.

## 2014-09-08 NOTE — Assessment & Plan Note (Signed)
Pain as outlined.  Exam as outlined.  Schedule diagnostic mammogram with right breast ultrasound.

## 2014-09-08 NOTE — Assessment & Plan Note (Signed)
Found incidentally on CT abdomen and pelvis.  Does not smoke.  Dr Faith Rogue reviewed.  Recommended f/u CT chest in one year.  Pt had questions regarding this.  Consider f/u CT in 6 months.

## 2014-09-09 ENCOUNTER — Ambulatory Visit: Payer: Self-pay | Admitting: Internal Medicine

## 2014-09-09 LAB — HM MAMMOGRAPHY: HM Mammogram: NEGATIVE

## 2014-09-12 ENCOUNTER — Encounter: Payer: Self-pay | Admitting: Internal Medicine

## 2014-09-14 ENCOUNTER — Other Ambulatory Visit: Payer: Self-pay | Admitting: Internal Medicine

## 2014-09-15 ENCOUNTER — Telehealth: Payer: Self-pay | Admitting: Internal Medicine

## 2014-09-15 NOTE — Telephone Encounter (Signed)
Pt notified of negative mammogram via my chart.

## 2014-09-15 NOTE — Telephone Encounter (Signed)
Pt notified of cxr results via my chart.

## 2014-09-28 ENCOUNTER — Encounter: Payer: Self-pay | Admitting: Family Medicine

## 2014-09-28 ENCOUNTER — Ambulatory Visit (INDEPENDENT_AMBULATORY_CARE_PROVIDER_SITE_OTHER): Payer: Medicaid Other | Admitting: Family Medicine

## 2014-09-28 VITALS — BP 122/78 | HR 94 | Temp 98.9°F | Wt 266.5 lb

## 2014-09-28 DIAGNOSIS — R05 Cough: Secondary | ICD-10-CM

## 2014-09-28 DIAGNOSIS — R059 Cough, unspecified: Secondary | ICD-10-CM | POA: Insufficient documentation

## 2014-09-28 MED ORDER — ALBUTEROL SULFATE HFA 108 (90 BASE) MCG/ACT IN AERS: 2.0000 | INHALATION_SPRAY | Freq: Four times a day (QID) | RESPIRATORY_TRACT | Status: DC | PRN

## 2014-09-28 NOTE — Assessment & Plan Note (Signed)
Nontoxic, likely viral, short duration.  Use SABA with routine cautions and f/u prn.  She agrees.

## 2014-09-28 NOTE — Patient Instructions (Signed)
Rest and fluids in the meantime.  Use the inhaler as needed and this should gradually improve.   It looks like you have a cold/viral infection that should resolve on its own.  Take care.

## 2014-09-28 NOTE — Progress Notes (Signed)
Pre visit review using our clinic review tool, if applicable. No additional management support is needed unless otherwise documented below in the visit note.  Sx started about 3 days ago.  Cough at night initially, dry.  Runny nose.  She would get a "catch" in her breath.  Generally doesn't feel well.  Can get out of breath more easliy than normal.  No ear pain.  No FCNAV.  No sputum.  No wheeze.  She has some throat irritation occ.  No facial pain.    Meds, vitals, and allergies reviewed.   ROS: See HPI.  Otherwise, noncontributory.  nad ncat Tm wnl Nasal > oral irritation.  MMM, no exudates Sinuses not ttp x4 Chest wall sore with cough at the sternum.  rrr ctab Abd soft Skin well perfused.

## 2014-09-30 ENCOUNTER — Other Ambulatory Visit: Payer: Self-pay | Admitting: Internal Medicine

## 2014-10-07 ENCOUNTER — Encounter: Payer: Self-pay | Admitting: Internal Medicine

## 2014-10-12 ENCOUNTER — Ambulatory Visit (INDEPENDENT_AMBULATORY_CARE_PROVIDER_SITE_OTHER): Payer: Medicaid Other | Admitting: Internal Medicine

## 2014-10-12 ENCOUNTER — Other Ambulatory Visit (HOSPITAL_COMMUNITY)
Admission: RE | Admit: 2014-10-12 | Discharge: 2014-10-12 | Disposition: A | Discharge status: Home / Self Care | Payer: Medicaid Other | Source: Ambulatory Visit | Attending: Internal Medicine | Admitting: Internal Medicine

## 2014-10-12 ENCOUNTER — Telehealth: Payer: Self-pay | Admitting: *Deleted

## 2014-10-12 ENCOUNTER — Encounter: Payer: Self-pay | Admitting: Internal Medicine

## 2014-10-12 VITALS — BP 120/76 | HR 72 | Temp 98.5°F | Ht 69.5 in | Wt 263.5 lb

## 2014-10-12 DIAGNOSIS — R110 Nausea: Secondary | ICD-10-CM

## 2014-10-12 DIAGNOSIS — Z01419 Encounter for gynecological examination (general) (routine) without abnormal findings: Secondary | ICD-10-CM | POA: Diagnosis not present

## 2014-10-12 DIAGNOSIS — M549 Dorsalgia, unspecified: Secondary | ICD-10-CM

## 2014-10-12 DIAGNOSIS — R1084 Generalized abdominal pain: Secondary | ICD-10-CM

## 2014-10-12 DIAGNOSIS — N644 Mastodynia: Secondary | ICD-10-CM

## 2014-10-12 DIAGNOSIS — E7800 Pure hypercholesterolemia, unspecified: Secondary | ICD-10-CM

## 2014-10-12 DIAGNOSIS — E669 Obesity, unspecified: Secondary | ICD-10-CM

## 2014-10-12 DIAGNOSIS — R911 Solitary pulmonary nodule: Secondary | ICD-10-CM

## 2014-10-12 DIAGNOSIS — R519 Headache, unspecified: Secondary | ICD-10-CM

## 2014-10-12 DIAGNOSIS — Z1151 Encounter for screening for human papillomavirus (HPV): Secondary | ICD-10-CM | POA: Insufficient documentation

## 2014-10-12 DIAGNOSIS — G8929 Other chronic pain: Secondary | ICD-10-CM

## 2014-10-12 DIAGNOSIS — R51 Headache: Secondary | ICD-10-CM

## 2014-10-12 DIAGNOSIS — K219 Gastro-esophageal reflux disease without esophagitis: Secondary | ICD-10-CM

## 2014-10-12 DIAGNOSIS — Z124 Encounter for screening for malignant neoplasm of cervix: Secondary | ICD-10-CM

## 2014-10-12 MED ORDER — ONDANSETRON HCL 4 MG PO TABS: 4.0000 mg | ORAL_TABLET | Freq: Three times a day (TID) | ORAL | Status: DC | PRN

## 2014-10-12 NOTE — Telephone Encounter (Signed)
Orders placed for labs

## 2014-10-12 NOTE — Progress Notes (Signed)
Subjective:    Patient ID: Kathleen Everett, female    DOB: Jun 16, 1971, 43 y.o.   MRN: [id number]  HPI 43 year old female with past history of allergy problems, bladder ulcers with previous urological w/up requiring pubovaginal sling, depression, hypercholesterolemia and s/p chiari decompression.  She comes in today for a scheduled follow up and for a pelvic/pap smear.  She continues to have back pain.  Has been followed at pain clinic.  States she is unable to work secondary to the increased pain and decreased function.  Applying for disability.  She requested referral back to Dr Brien Few for further evaluation and treatment.  She also has a history of persistent headaches.  Recently was evaluated by her neurosurgeon (Dr Cristopher Peru). MRI - ok.   She saw Dr Manuella Ghazi.  On topamax and uses imitrex prn.  No chest pain or tightness.  Is followed by Dr Lavera Guise.   Recent tests per her report - negative.  Some sob and cough/congestion recently.  Saw Dr Damita Dunnings.  Was given an inhaler.  Better.  No chest pain or tightness.  She was recently evalauted for abdominal pain. Had CT scan.  No CT findings in the abdomen and pelvis to explain her symptoms. No blood.  Discussed referral back to GI.  She has to postpone now.  Is losing her medicaid coverage.  Did have 54mm right middle lobe lung nodule.  Does not smoke.  Dr Faith Rogue reviewed. Recommended f/u CT chest in one year.  Increased stress related to her insurance coverage and financial situation.      Past Medical History  Diagnosis Date  . Hyperlipidemia   . Chronic tension headaches   . Dysmenorrhea   . Hidradenitis   . Endometriosis   . Depression   . Osteoarthritis   . Obesity   . Ovarian cyst     hx  . GERD (gastroesophageal reflux disease)   . Chiari malformation     s/p sgy decompression    Current Outpatient Prescriptions on File Prior to Visit  Medication Sig Dispense Refill  . albuterol (PROVENTIL HFA;VENTOLIN HFA) 108 (90 BASE) MCG/ACT inhaler  Inhale 2 puffs into the lungs every 6 (six) hours as needed for wheezing or shortness of breath. 1 Inhaler 0  . atorvastatin (LIPITOR) 20 MG tablet Take 1 tablet (20 mg total) by mouth daily. 30 tablet 5  . buPROPion (WELLBUTRIN SR) 200 MG 12 hr tablet TAKE ONE TABLET BY MOUTH ONCE DAILY 30 tablet 2  . cholecalciferol (VITAMIN D) 1000 UNITS tablet Take 2,000 Units by mouth daily.    . cyanocobalamin (,VITAMIN B-12,) 1000 MCG/ML injection INJECT ONE ML (CC) INTRAMUSCULARLY ONCE EVERY MONTH 1 mL 5  . esomeprazole (NEXIUM) 40 MG capsule Take 1 capsule (40 mg total) by mouth 2 (two) times daily before a meal. 60 capsule 2  . estradiol (ESTRACE) 1 MG tablet TAKE ONE TABLET BY MOUTH ONCE DAILY 30 tablet 5  . lidocaine (LIDODERM) 5 % Place 1 patch onto the skin daily. Remove & Discard patch within 12 hours or as directed by MD    . Magnesium Oxide 400 (240 MG) MG TABS One tablet daily 30 tablet 1  . Nutritional Supplements (JUICE PLUS FIBRE PO) Take by mouth.    . ondansetron (ZOFRAN) 4 MG tablet Take 1 tablet (4 mg total) by mouth every 8 (eight) hours as needed for nausea or vomiting. 20 tablet 1  . oxyCODONE-acetaminophen (PERCOCET) 10-325 MG per tablet Take 1 tablet by mouth every  8 (eight) hours as needed for pain.     . Phosphatidylserine-DHA-EPA (VAYARIN) 75-21.5-8.5 MG CAPS Take by mouth daily.    . Probiotic Product (PROBIOTIC DAILY PO) Take by mouth.    . SUMAtriptan (IMITREX) 50 MG tablet Take 50 mg by mouth as needed for migraine or headache. May repeat in 2 hours if headache persists or recurs.    . topiramate (TOPAMAX) 50 MG tablet Take 50 mg by mouth 2 (two) times daily.     No current facility-administered medications on file prior to visit.    Review of Systems History of headaches as outlined.  Seeing Dr Manuella Ghazi.  On topamax and taking imitrex prn.  No palpitations.  No chest pain or discomfort.  Cough, congestion and sob better.  Using inhaler.  No significant nausea now.  Uses zofran  prn.  No vomiting.  Previous evaluation for abdominal pain as outlined.  Appears to be better.  Did eat some raisins recently with flare.  Eating and drinking ok.  No bowel change, such as diarrhea, constipation, BRBPR or melana.   Has chronic back pain as outlined.  Limits her mobility and function.  Requested referral back to Dr Brien Few.  CT as outlined.             Objective:   Physical Exam  Filed Vitals:   10/12/14 0802  BP: 120/76  Pulse: 72  Temp: 98.5 F (53.22 C)   43 year old female in no acute distress.   HEENT:  Nares- clear.  Oropharynx - without lesions. NECK:  Supple.  Nontender.  No audible bruit.  HEART:  Appears to be regular. LUNGS:  No crackles or wheezing audible.  Respirations even and unlabored.  RADIAL PULSE:  Equal bilaterally.  ABDOMEN:  Soft.  No significant tenderness.  Minimal left lower quadrant tenderness.   Bowel sounds present and normal.  No audible abdominal bruit.  GU:  Normal external genitalia.  Vaginal vault without lesions.  Cervix identified.  Pap performed. Could not appreciate any adnexal masses or tenderness.   RECTAL:  Not performed.    EXTREMITIES:  No increased edema present.  DP pulses palpable and equal bilaterally.          Assessment & Plan:  1. Nausea No significant nausea now.   - ondansetron (ZOFRAN) 4 MG tablet; Take 1 tablet (4 mg total) by mouth every 8 (eight) hours as needed for nausea or vomiting.  Dispense: 20 tablet; Refill: 1  2. Pap smear for cervical cancer screening - Cytology - PAP today.    3. Gastroesophageal reflux disease, esophagitis presence not specified No increased problems with reflux reported.  Follow.    4. Chronic back pain Has been followed at pain clinic.  Still with increased pain.  Has requested referral back to Dr Brien Few.  Referral made.    5. Headache, unspecified headache type Seeing Dr Manuella Ghazi.  Stable.  See above.    6. Hypercholesterolemia Low cholesterol diet and exercise as tolerated.  On  lipitor.  - Lipid panel; Future - Hepatic function panel; Future  7. Obesity Diet and exercise.   - CBC with Differential; Future - TSH; Future - Basic metabolic panel; Future  8. Lung nodule Found incidentally on CT abdomen/pelvis.  Does not smoke.  Dr Faith Rogue reviewed.  Recommended f/u CT chest in one year.  See last note.   9. Generalized abdominal pain Had recent CT scan.  Unrevealing of an etiology for her pain.  Recommend referral back to  GI.  She is unable at this time.  Follow.    10. Breast pain, right Diagnostic mammo as oulined.  Birads I.  Due f/u bilateral screening mammogram in 12.15.  Discussed with her today.  She will schedule through the [facility]    11. PSYCH.  Has been on wellbutrin.  Was seeing Dr Clovis Riley.  Was referred to Dr Kasandra Knudsen.  Not seeing anyone now.  She feels things are stable.  Follow.   12. GYN.  S/p BSO.  Was seeing Dr Rayford Halsted.    13. PREVIOUS DOCUMENTED ELEVATED BLOOD PRESSURE. Blood pressure doing well.  On no medication.  Follow.   14. BREAST NODULE.  Mammogram 1/12 revealed a smoothly marginated nodule.  Mammogram read as a BiRADS II.  Recommended yearly follow up.  She just had f/u mammogram 10/18/13 - Birads I.  Right breast pain.  Had diagnostic mammogram with right breast ultrasound in 10/15.  Birads I.  Due f/u bilateral screening mammogram 12.15.  She will need to schedule through the [facility].  She will schedule.    HEALTH MAINTENANCE.  Physical 04/19/14.   Mammogram as outlined.   Has had GI evaluation.   I spent 25 minutes with the patient and more than 50% of the time was spent in consultation regarding the above.

## 2014-10-12 NOTE — Telephone Encounter (Signed)
Pt is coming in Monday what labs and dx? 

## 2014-10-12 NOTE — Progress Notes (Signed)
Pre visit review using our clinic review tool, if applicable. No additional management support is needed unless otherwise documented below in the visit note. 

## 2014-10-16 ENCOUNTER — Encounter: Payer: Self-pay | Admitting: Internal Medicine

## 2014-10-16 DIAGNOSIS — Z6835 Body mass index (BMI) 35.0-35.9, adult: Secondary | ICD-10-CM | POA: Insufficient documentation

## 2014-10-16 DIAGNOSIS — Z6831 Body mass index (BMI) 31.0-31.9, adult: Secondary | ICD-10-CM | POA: Insufficient documentation

## 2014-10-16 DIAGNOSIS — Z6841 Body Mass Index (BMI) 40.0 and over, adult: Secondary | ICD-10-CM | POA: Insufficient documentation

## 2014-10-16 DIAGNOSIS — E669 Obesity, unspecified: Secondary | ICD-10-CM | POA: Insufficient documentation

## 2014-10-17 ENCOUNTER — Other Ambulatory Visit (INDEPENDENT_AMBULATORY_CARE_PROVIDER_SITE_OTHER): Payer: Medicaid Other

## 2014-10-17 ENCOUNTER — Encounter: Payer: Self-pay | Admitting: Internal Medicine

## 2014-10-17 DIAGNOSIS — E7800 Pure hypercholesterolemia, unspecified: Secondary | ICD-10-CM

## 2014-10-17 DIAGNOSIS — E669 Obesity, unspecified: Secondary | ICD-10-CM

## 2014-10-17 LAB — BASIC METABOLIC PANEL
BUN: 11 mg/dL (ref 6–23)
CHLORIDE: 108 meq/L (ref 96–112)
CO2: 22 mEq/L (ref 19–32)
Calcium: 8.8 mg/dL (ref 8.4–10.5)
Creatinine, Ser: 0.8 mg/dL (ref 0.4–1.2)
GFR: 79.29 mL/min (ref >60.00)
Glucose, Bld: 91 mg/dL (ref 70–99)
Potassium: 3.8 mEq/L (ref 3.5–5.1)
Sodium: 140 mEq/L (ref 135–145)

## 2014-10-17 LAB — HEPATIC FUNCTION PANEL
ALBUMIN: 3.9 g/dL (ref 3.5–5.2)
ALK PHOS: 92 U/L (ref 39–117)
ALT: 27 U/L (ref 0–35)
AST: 22 U/L (ref 0–37)
BILIRUBIN TOTAL: 0.2 mg/dL (ref 0.2–1.2)
Bilirubin, Direct: 0 mg/dL (ref 0.0–0.3)
Total Protein: 6.7 g/dL (ref 6.0–8.3)

## 2014-10-17 LAB — LIPID PANEL
Cholesterol: 170 mg/dL (ref 0–200)
HDL: 40.7 mg/dL (ref >39.00)
LDL Cholesterol: 98 mg/dL (ref 0–99)
NONHDL: 129.3
Total CHOL/HDL Ratio: 4
Triglycerides: 158 mg/dL — ABNORMAL HIGH (ref 0.0–149.0)
VLDL: 31.6 mg/dL (ref 0.0–40.0)

## 2014-10-17 LAB — CBC WITH DIFFERENTIAL/PLATELET
BASOS ABS: 0 10*3/uL (ref 0.0–0.1)
Basophils Relative: 0.4 % (ref 0.0–3.0)
Eosinophils Absolute: 0.1 10*3/uL (ref 0.0–0.7)
Eosinophils Relative: 1.9 % (ref 0.0–5.0)
HCT: 37.4 % (ref 36.0–46.0)
Hemoglobin: 12.3 g/dL (ref 12.0–15.0)
LYMPHS ABS: 1.2 10*3/uL (ref 0.7–4.0)
Lymphocytes Relative: 21.9 % (ref 12.0–46.0)
MCHC: 32.9 g/dL (ref 30.0–36.0)
MCV: 87.3 fl (ref 78.0–100.0)
MONOS PCT: 5.9 % (ref 3.0–12.0)
Monocytes Absolute: 0.3 10*3/uL (ref 0.1–1.0)
NEUTROS PCT: 69.9 % (ref 43.0–77.0)
Neutro Abs: 3.9 10*3/uL (ref 1.4–7.7)
Platelets: 219 10*3/uL (ref 150.0–400.0)
RBC: 4.28 Mil/uL (ref 3.87–5.11)
RDW: 13.9 % (ref 11.5–15.5)
WBC: 5.6 10*3/uL (ref 4.0–10.5)

## 2014-10-17 LAB — TSH: TSH: 1.84 u[IU]/mL (ref 0.35–4.50)

## 2014-10-17 LAB — CYTOLOGY - PAP

## 2014-10-20 ENCOUNTER — Emergency Department: Payer: Self-pay | Admitting: Emergency Medicine

## 2014-10-21 ENCOUNTER — Ambulatory Visit: Payer: Medicaid Other | Admitting: Internal Medicine

## 2014-11-21 ENCOUNTER — Other Ambulatory Visit: Payer: Self-pay | Admitting: Internal Medicine

## 2014-11-28 ENCOUNTER — Telehealth: Payer: Self-pay | Admitting: Internal Medicine

## 2014-11-28 NOTE — Telephone Encounter (Signed)
It appears pt already scheduled an appt.

## 2014-11-28 NOTE — Telephone Encounter (Signed)
FYI

## 2014-11-28 NOTE — Telephone Encounter (Signed)
Patient Name: Kathleen Everett  DOB: [birth year]    Nurse Assessment  Nurse: Raphael Gibney, RN, Vera Date/Time (Eastern Time): 11/28/2014 11:28:00 AM  Confirm and document reason for call. If symptomatic, describe symptoms. ---Caller states she has headaches anyway. She is having sinus drainage which started last Tuesday and nasal congestion is now yellow. Has a slight sore throat. She has a cough. She has had some SOB. Has been using Flonase and inhaler which helps. No fever. She has a sinus headache.  Has the patient traveled out of the country within the last 30 days? ---No  Does the patient require triage? ---Yes  Related visit to physician within the last 2 weeks? ---No  Does the PT have any chronic conditions? (i.e. diabetes, asthma, etc.) ---Yes  List chronic conditions. ---chronic back pain; migraines  Did the patient indicate they were pregnant? ---No     Guidelines    Guideline Title Affirmed Question Affirmed Notes  Sinus Pain or Congestion [1] Redness or swelling on the cheek, forehead or around the eye AND [2] no fever    Final Disposition User   See Physician within 4 Hours (or PCP triage) Raphael Gibney, RN, Vera    Comments  Warm transferred to the office as pt states she can not go to appt today as she has to pick up her children from school as they are only there 1/2 day today and wants to make appt for tomorrow.

## 2014-11-29 ENCOUNTER — Encounter: Payer: Self-pay | Admitting: Internal Medicine

## 2014-11-29 ENCOUNTER — Ambulatory Visit (INDEPENDENT_AMBULATORY_CARE_PROVIDER_SITE_OTHER): Payer: Medicaid Other | Admitting: Internal Medicine

## 2014-11-29 VITALS — BP 122/84 | HR 81 | Temp 98.4°F | Wt 261.0 lb

## 2014-11-29 DIAGNOSIS — F439 Reaction to severe stress, unspecified: Secondary | ICD-10-CM

## 2014-11-29 DIAGNOSIS — F419 Anxiety disorder, unspecified: Secondary | ICD-10-CM

## 2014-11-29 DIAGNOSIS — J0100 Acute maxillary sinusitis, unspecified: Secondary | ICD-10-CM

## 2014-11-29 DIAGNOSIS — Z1239 Encounter for other screening for malignant neoplasm of breast: Secondary | ICD-10-CM

## 2014-11-29 DIAGNOSIS — R232 Flushing: Secondary | ICD-10-CM

## 2014-11-29 MED ORDER — CEFUROXIME AXETIL 500 MG PO TABS: 500.0000 mg | ORAL_TABLET | Freq: Two times a day (BID) | ORAL | Status: DC

## 2014-11-29 NOTE — Progress Notes (Signed)
Pre visit review using our clinic review tool, if applicable. No additional management support is needed unless otherwise documented below in the visit note. 

## 2014-11-29 NOTE — Progress Notes (Signed)
HPI  Kathleen Everett is a 44 year old female who presents to clinic with c/o nasal congestion, runny nose, facial pain and pressure, non-productive cough x 2-3 weeks, and SOB x 1 week.  Her nasal mucous is  yellow/blood tinged. She denies fever, chills or body aches. She just started using inhaler and Flonase with minimal relief. Reports sick contacts. She has no history of allergies or breathing problems.    Review of Systems    Past Medical History  Diagnosis Date  . Hyperlipidemia   . Chronic tension headaches   . Dysmenorrhea   . Hidradenitis   . Endometriosis   . Depression   . Osteoarthritis   . Obesity   . Ovarian cyst     hx  . GERD (gastroesophageal reflux disease)   . Chiari malformation     s/p sgy decompression    Family History  Problem Relation Age of Onset  . Hyperlipidemia Mother   . Anxiety disorder Mother   . Anxiety disorder Father   . Stroke Father   . Hyperlipidemia Father   . Hypertension Father   . Breast cancer Neg Hx   . Colon cancer Neg Hx     History   Social History  . Marital Status: Married    Spouse Name: N/A    Number of Children: 3  . Years of Education: N/A   Occupational History  . Not on file.   Social History Main Topics  . Smoking status: Never Smoker   . Smokeless tobacco: Never Used  . Alcohol Use: 0.0 oz/week    0 Not specified per week     Comment: rare  . Drug Use: No  . Sexual Activity: Not on file   Other Topics Concern  . Not on file   Social History Narrative    Allergies  Allergen Reactions  . Celexa [Citalopram Hydrobromide] Other (See Comments)    Intolerance   . Penicillins Rash  . Sulfa Antibiotics Rash  . Vancocin [Vancomycin] Rash     Constitutional: Positive headache, fatigue. Denies fever or abrupt weight changes.  HEENT:  Positive facial pain, nasal congestion and sore throat. Denies eye redness, ear pain, ringing in the ears, wax buildup, runny nose or bloody nose. Respiratory:  Positive cough. Denies difficulty breathing or shortness of breath.  Cardiovascular: Denies chest pain, chest tightness, palpitations or swelling in the hands or feet.   No other specific complaints in a complete review of systems (except as listed in HPI above).  Objective:  BP 122/84 mmHg  Pulse 81  Temp(Src) 98.4 F (36.9 C) (Oral)  Wt 261 lb (118.389 kg)  SpO2 98%  LMP 12/08/2006   General: Appears her stated age, ill appearing  in NAD. HEENT: Head: normal shape and size, maxillary sinus tenderness noted; Eyes: sclera white, no icterus, conjunctiva pink; Ears: Tm's gray and intact, normal light reflex; Nose: mucosa pink and moist, septum midline; Throat/Mouth: + PND. Teeth present, mucosa pink and moist, no exudate noted, no lesions or ulcerations noted.  Neck: No adenopathy noted. Cardiovascular: Normal rate and rhythm. S1,S2 noted.  No murmur, rubs or gallops noted.  Pulmonary/Chest: Normal effort and positive vesicular breath sounds. No respiratory distress. No wheezes, rales or ronchi noted.      Assessment & Plan:   Acute maxillary sinusitis  Flonase 2 sprays each nostril for 3 days and then as needed. Ceftin BID for 10 days Continue advair  RTC as needed or if symptoms persist.

## 2014-11-29 NOTE — Telephone Encounter (Signed)
Patient states she has insurance back and is ready for referral to therapist as discussed at last visit. Patient also wanted you to know that she has not heard back from Dr. Brien Few. Please Advise.

## 2014-11-29 NOTE — Telephone Encounter (Signed)
Patient would also like mammogram.

## 2014-11-29 NOTE — Telephone Encounter (Signed)
Mammogram ordered

## 2014-11-29 NOTE — Patient Instructions (Signed)

## 2014-11-30 NOTE — Telephone Encounter (Signed)
Order placed for psychiatry referral.

## 2014-12-27 ENCOUNTER — Ambulatory Visit: Payer: Self-pay | Admitting: Internal Medicine

## 2014-12-27 LAB — HM MAMMOGRAPHY: HM Mammogram: NEGATIVE

## 2015-01-04 ENCOUNTER — Other Ambulatory Visit: Payer: Self-pay | Admitting: Internal Medicine

## 2015-02-13 ENCOUNTER — Ambulatory Visit: Payer: Medicaid Other | Admitting: Internal Medicine

## 2015-02-14 ENCOUNTER — Encounter: Payer: Self-pay | Admitting: Internal Medicine

## 2015-02-14 ENCOUNTER — Ambulatory Visit (INDEPENDENT_AMBULATORY_CARE_PROVIDER_SITE_OTHER): Payer: Medicaid Other | Admitting: Internal Medicine

## 2015-02-14 VITALS — BP 110/76 | HR 80 | Temp 98.4°F | Ht 69.5 in | Wt 260.0 lb

## 2015-02-14 DIAGNOSIS — K219 Gastro-esophageal reflux disease without esophagitis: Secondary | ICD-10-CM | POA: Diagnosis not present

## 2015-02-14 DIAGNOSIS — R079 Chest pain, unspecified: Secondary | ICD-10-CM | POA: Diagnosis not present

## 2015-02-14 DIAGNOSIS — M549 Dorsalgia, unspecified: Secondary | ICD-10-CM | POA: Diagnosis not present

## 2015-02-14 DIAGNOSIS — R51 Headache: Secondary | ICD-10-CM

## 2015-02-14 DIAGNOSIS — Z658 Other specified problems related to psychosocial circumstances: Secondary | ICD-10-CM

## 2015-02-14 DIAGNOSIS — G8929 Other chronic pain: Secondary | ICD-10-CM

## 2015-02-14 DIAGNOSIS — R519 Headache, unspecified: Secondary | ICD-10-CM

## 2015-02-14 DIAGNOSIS — G4730 Sleep apnea, unspecified: Secondary | ICD-10-CM

## 2015-02-14 DIAGNOSIS — E7800 Pure hypercholesterolemia, unspecified: Secondary | ICD-10-CM

## 2015-02-14 DIAGNOSIS — F439 Reaction to severe stress, unspecified: Secondary | ICD-10-CM

## 2015-02-14 DIAGNOSIS — R911 Solitary pulmonary nodule: Secondary | ICD-10-CM

## 2015-02-14 DIAGNOSIS — E669 Obesity, unspecified: Secondary | ICD-10-CM

## 2015-02-14 DIAGNOSIS — Z0000 Encounter for general adult medical examination without abnormal findings: Secondary | ICD-10-CM

## 2015-02-14 NOTE — Progress Notes (Signed)
Pre visit review using our clinic review tool, if applicable. No additional management support is needed unless otherwise documented below in the visit note. 

## 2015-02-15 ENCOUNTER — Encounter: Payer: Self-pay | Admitting: Internal Medicine

## 2015-02-15 DIAGNOSIS — R079 Chest pain, unspecified: Secondary | ICD-10-CM

## 2015-02-15 DIAGNOSIS — F439 Reaction to severe stress, unspecified: Secondary | ICD-10-CM | POA: Insufficient documentation

## 2015-02-15 DIAGNOSIS — Z0000 Encounter for general adult medical examination without abnormal findings: Secondary | ICD-10-CM | POA: Insufficient documentation

## 2015-02-15 HISTORY — DX: Chest pain, unspecified: R07.9

## 2015-02-15 NOTE — Assessment & Plan Note (Signed)
Pt has sleep apnea.  Had to turn in her CPAP when she lost her insurance coverage.  States has to have another sleep study to get her CPAP.  Order placed.

## 2015-02-15 NOTE — Assessment & Plan Note (Signed)
Taking nexium twice a day.  Still with some break through symptoms.  Discussed adding zantac.  Will refer to GI given persistent retlux despite medications.  Pursue cardiac evaluation first.

## 2015-02-15 NOTE — Assessment & Plan Note (Signed)
Still with increased pain.  Has appt scheduled to f/u with Dr Brien Few.

## 2015-02-15 NOTE — Assessment & Plan Note (Signed)
Physical 04/19/14.  Mammogram 2//16 - birads I.  Refer to GI as outlined.

## 2015-02-15 NOTE — Progress Notes (Signed)
Patient ID: Kathleen Everett, female   DOB: October 26, 1971, 44 y.o.   MRN: [id number]   Subjective:    Patient ID: Kathleen Everett, female    DOB: 05-05-1971, 44 y.o.   MRN: [id number]  HPI  Patient here for a scheduled follow up.  States she is having persistent headaches and dizziness.  Seeing Dr Manuella Ghazi.  On topamax.  Uses imitrex prn.  Does not feel helping.  Plans to discuss with Dr Manuella Ghazi.  She is seeing Dr Jimmye Norman and a counselor for the increased stress, etc.  lexapro recently added.  Started 2.5 weeks ago.  Plans to possibly f'/u with Dr Valentina Shaggy as well.  Due to see Dr Brien Few 02/27/15.  Still having back pain.  Plans to discuss with him.  Left side and left leg bothers her more than right.  She also informs me she needs a new sleep study.  Insurance requiring.  Intermittent chest pain.  No known triggers.  Still having increased acid reflux as well.  Takes nexium bid.  Still with break through symptoms.  No vomiting.     Past Medical History  Diagnosis Date  . Hyperlipidemia   . Chronic tension headaches   . Dysmenorrhea   . Hidradenitis   . Endometriosis   . Depression   . Osteoarthritis   . Obesity   . Ovarian cyst     hx  . GERD (gastroesophageal reflux disease)   . Chiari malformation     s/p sgy decompression    Current Outpatient Prescriptions on File Prior to Visit  Medication Sig Dispense Refill  . albuterol (PROVENTIL HFA;VENTOLIN HFA) 108 (90 BASE) MCG/ACT inhaler Inhale 2 puffs into the lungs every 6 (six) hours as needed for wheezing or shortness of breath. 1 Inhaler 0  . atorvastatin (LIPITOR) 20 MG tablet Take 1 tablet (20 mg total) by mouth daily. 30 tablet 5  . buPROPion (WELLBUTRIN SR) 200 MG 12 hr tablet TAKE ONE TABLET BY MOUTH ONCE DAILY 30 tablet 2  . cholecalciferol (VITAMIN D) 1000 UNITS tablet Take 2,000 Units by mouth daily.    . cyanocobalamin (,VITAMIN B-12,) 1000 MCG/ML injection INJECT ONE ML (CC) INTRAMUSCULARLY ONCE EVERY MONTH 1 mL 5  .  esomeprazole (NEXIUM) 40 MG capsule Take 1 capsule (40 mg total) by mouth 2 (two) times daily before a meal. 60 capsule 2  . estradiol (ESTRACE) 1 MG tablet TAKE ONE TABLET BY MOUTH ONCE DAILY 30 tablet 5  . Magnesium Oxide 400 (240 MG) MG TABS One tablet daily 30 tablet 1  . Nutritional Supplements (JUICE PLUS FIBRE PO) Take by mouth.    . ondansetron (ZOFRAN) 4 MG tablet Take 1 tablet (4 mg total) by mouth every 8 (eight) hours as needed for nausea or vomiting. 20 tablet 1  . oxyCODONE-acetaminophen (PERCOCET) 10-325 MG per tablet Take 1 tablet by mouth every 8 (eight) hours as needed for pain.     . Probiotic Product (PROBIOTIC DAILY PO) Take by mouth.    . SUMAtriptan (IMITREX) 50 MG tablet Take 50 mg by mouth as needed for migraine or headache. May repeat in 2 hours if headache persists or recurs.    . topiramate (TOPAMAX) 50 MG tablet Take 50 mg by mouth 2 (two) times daily.     No current facility-administered medications on file prior to visit.    Review of Systems  Constitutional: Negative for appetite change and unexpected weight change.  HENT: Negative for congestion and sinus pressure.   Respiratory:  Negative for cough, chest tightness and shortness of breath.   Cardiovascular: Positive for chest pain. Negative for palpitations and leg swelling.  Gastrointestinal: Negative for nausea, vomiting and diarrhea.  Genitourinary: Negative for dysuria and difficulty urinating.  Musculoskeletal: Positive for back pain (chronic back pain). Negative for joint swelling.  Skin: Negative for color change and rash.  Neurological: Positive for dizziness and headaches.  Hematological: Negative for adenopathy. Does not bruise/bleed easily.  Psychiatric/Behavioral:       Seeing psych and a counselor.  Just started lexapro.         Objective:    Physical Exam  Constitutional: She appears well-developed and well-nourished. No distress.  HENT:  Nose: Nose normal.  Mouth/Throat: Oropharynx is  clear and moist.  Neck: Neck supple. No thyromegaly present.  Cardiovascular: Normal rate and regular rhythm.   Pulmonary/Chest: Breath sounds normal. No respiratory distress. She has no wheezes.  Abdominal: Soft. Bowel sounds are normal. There is no tenderness.  Musculoskeletal: She exhibits no edema or tenderness.  Lymphadenopathy:    She has no cervical adenopathy.  Skin: No rash noted. No erythema.    BP 110/76 mmHg  Pulse 80  Temp(Src) 98.4 F (36.9 C) (Oral)  Ht 5' 9.5" (1.765 m)  Wt 260 lb (117.935 kg)  BMI 37.86 kg/m2  SpO2 97%  LMP 12/08/2006 Wt Readings from Last 3 Encounters:  02/14/15 260 lb (117.935 kg)  11/29/14 261 lb (118.389 kg)  10/12/14 263 lb 8 oz (119.523 kg)     Lab Results  Component Value Date   WBC 5.6 10/17/2014   HGB 12.3 10/17/2014   HCT 37.4 10/17/2014   PLT 219.0 10/17/2014   GLUCOSE 91 10/17/2014   CHOL 170 10/17/2014   TRIG 158.0* 10/17/2014   HDL 40.70 10/17/2014   LDLDIRECT 152.6 06/30/2014   LDLCALC 98 10/17/2014   ALT 27 10/17/2014   AST 22 10/17/2014   NA 140 10/17/2014   K 3.8 10/17/2014   CL 108 10/17/2014   CREATININE 0.8 10/17/2014   BUN 11 10/17/2014   CO2 22 10/17/2014   TSH 1.84 10/17/2014       Assessment & Plan:   Problem List Items Addressed This Visit    Chest pain - Primary    Persistent intermittent chest pain.  No known triggers.  EKG obtained and revealed SR with no acute ischemic changes.  Given risk factors, will refer to cardiology for further evaluation.        Relevant Orders   EKG 12-Lead (Completed)   Ambulatory referral to Cardiology   Chronic back pain    Still with increased pain.  Has appt scheduled to f/u with Dr Brien Few.        Relevant Medications   baclofen (LIORESAL) 10 MG tablet   ibuprofen (ADVIL,MOTRIN) 800 MG tablet   GERD (gastroesophageal reflux disease)    Taking nexium twice a day.  Still with some break through symptoms.  Discussed adding zantac.  Will refer to GI given  persistent retlux despite medications.  Pursue cardiac evaluation first.        Relevant Orders   Ambulatory referral to Gastroenterology   Headache    Has a history of headaches.  Has a history of chiari malformation.  Is s/p chiari decompression.  Has been followed by neurosurgery - Dr Cristopher Peru.  Most recent MRI negative.  Was referred to neurology. Taking topamax.  Has imitrex.  Not helping.  Persistent problems.  She will contact Dr Manuella Ghazi given persistent problems.  Relevant Medications   baclofen (LIORESAL) 10 MG tablet   ibuprofen (ADVIL,MOTRIN) 800 MG tablet   escitalopram (LEXAPRO) 10 MG tablet   Health care maintenance    Physical 04/19/14.  Mammogram 2//16 - birads I.  Refer to GI as outlined.        Hypercholesterolemia    Low cholesterol diet and exercise.  On lipitor.  Check lipid panel and liver function tests.       Relevant Orders   Lipid panel   Hepatic function panel   Lung nodule    Found incidentally on CT abdomen and pelvis.  Does not smoke.  Dr Faith Rogue reviewed.  Recommended f/u CT chest in one year.  Will follow.       Obesity (BMI 30-39.9)    Diet and exercise.  Follow.       Sleep apnea    Pt has sleep apnea.  Had to turn in her CPAP when she lost her insurance coverage.  States has to have another sleep study to get her CPAP.  Order placed.        Relevant Orders   Ambulatory referral to Sleep Studies   Basic metabolic panel   Stress    Increased stress.  Seeing psychiatry and a counselor.  Just started lexapro.          I spent 25 minutes with the patient and more than 50% of the time was spent in consultation regarding the above.     [name], MD

## 2015-02-15 NOTE — Assessment & Plan Note (Signed)
Has a history of headaches.  Has a history of chiari malformation.  Is s/p chiari decompression.  Has been followed by neurosurgery - Dr Cristopher Peru.  Most recent MRI negative.  Was referred to neurology. Taking topamax.  Has imitrex.  Not helping.  Persistent problems.  She will contact Dr Manuella Ghazi given persistent problems.

## 2015-02-15 NOTE — Assessment & Plan Note (Signed)
Low cholesterol diet and exercise.  On lipitor.  Check lipid panel and liver function tests.

## 2015-02-15 NOTE — Assessment & Plan Note (Signed)
Persistent intermittent chest pain.  No known triggers.  EKG obtained and revealed SR with no acute ischemic changes.  Given risk factors, will refer to cardiology for further evaluation.

## 2015-02-15 NOTE — Assessment & Plan Note (Signed)
Found incidentally on CT abdomen and pelvis.  Does not smoke.  Dr Faith Rogue reviewed.  Recommended f/u CT chest in one year.  Will follow.

## 2015-02-15 NOTE — Assessment & Plan Note (Signed)
Diet and exercise.  Follow.  

## 2015-02-15 NOTE — Assessment & Plan Note (Signed)
Increased stress.  Seeing psychiatry and a counselor.  Just started lexapro.

## 2015-02-16 ENCOUNTER — Encounter: Payer: Self-pay | Admitting: Internal Medicine

## 2015-02-21 ENCOUNTER — Other Ambulatory Visit (INDEPENDENT_AMBULATORY_CARE_PROVIDER_SITE_OTHER): Payer: Medicaid Other

## 2015-02-21 DIAGNOSIS — E7800 Pure hypercholesterolemia, unspecified: Secondary | ICD-10-CM

## 2015-02-21 DIAGNOSIS — N951 Menopausal and female climacteric states: Secondary | ICD-10-CM | POA: Diagnosis not present

## 2015-02-21 DIAGNOSIS — G4730 Sleep apnea, unspecified: Secondary | ICD-10-CM

## 2015-02-21 DIAGNOSIS — R232 Flushing: Secondary | ICD-10-CM

## 2015-02-21 LAB — LIPID PANEL
CHOL/HDL RATIO: 4
Cholesterol: 180 mg/dL (ref 0–200)
HDL: 41.7 mg/dL (ref >39.00)
LDL CALC: 110 mg/dL — AB (ref 0–99)
NonHDL: 138.3
TRIGLYCERIDES: 143 mg/dL (ref 0.0–149.0)
VLDL: 28.6 mg/dL (ref 0.0–40.0)

## 2015-02-21 LAB — HEPATIC FUNCTION PANEL
ALBUMIN: 4.2 g/dL (ref 3.5–5.2)
ALK PHOS: 111 U/L (ref 39–117)
ALT: 21 U/L (ref 0–35)
AST: 17 U/L (ref 0–37)
BILIRUBIN TOTAL: 0.4 mg/dL (ref 0.2–1.2)
Bilirubin, Direct: 0.1 mg/dL (ref 0.0–0.3)
Total Protein: 6.6 g/dL (ref 6.0–8.3)

## 2015-02-21 LAB — BASIC METABOLIC PANEL
BUN: 12 mg/dL (ref 6–23)
CALCIUM: 9.3 mg/dL (ref 8.4–10.5)
CHLORIDE: 107 meq/L (ref 96–112)
CO2: 28 mEq/L (ref 19–32)
CREATININE: 0.85 mg/dL (ref 0.40–1.20)
GFR: 78.09 mL/min (ref >60.00)
Glucose, Bld: 87 mg/dL (ref 70–99)
Potassium: 4.4 mEq/L (ref 3.5–5.1)
SODIUM: 140 meq/L (ref 135–145)

## 2015-02-21 LAB — FOLLICLE STIMULATING HORMONE: FSH: 41.2 m[IU]/mL

## 2015-02-21 LAB — TSH: TSH: 2.05 u[IU]/mL (ref 0.35–4.50)

## 2015-02-21 NOTE — Telephone Encounter (Signed)
Order placed fsh and tsh labs.

## 2015-02-21 NOTE — Addendum Note (Signed)
Addended by: Alisa Graff on: 02/21/2015 01:23 AM   Modules accepted: Orders

## 2015-02-22 ENCOUNTER — Encounter: Payer: Self-pay | Admitting: Internal Medicine

## 2015-02-22 ENCOUNTER — Other Ambulatory Visit: Payer: Medicaid Other

## 2015-02-24 NOTE — Telephone Encounter (Signed)
Unread mychart message mailed to patient 

## 2015-03-09 ENCOUNTER — Encounter: Payer: Self-pay | Admitting: Cardiovascular Disease

## 2015-03-09 ENCOUNTER — Ambulatory Visit (INDEPENDENT_AMBULATORY_CARE_PROVIDER_SITE_OTHER): Payer: Medicaid Other | Admitting: Cardiovascular Disease

## 2015-03-09 VITALS — BP 110/70 | HR 68 | Ht 71.0 in | Wt 256.8 lb

## 2015-03-09 DIAGNOSIS — R000 Tachycardia, unspecified: Secondary | ICD-10-CM

## 2015-03-09 DIAGNOSIS — M549 Dorsalgia, unspecified: Secondary | ICD-10-CM

## 2015-03-09 DIAGNOSIS — R42 Dizziness and giddiness: Secondary | ICD-10-CM

## 2015-03-09 DIAGNOSIS — I341 Nonrheumatic mitral (valve) prolapse: Secondary | ICD-10-CM | POA: Diagnosis not present

## 2015-03-09 DIAGNOSIS — E669 Obesity, unspecified: Secondary | ICD-10-CM

## 2015-03-09 DIAGNOSIS — E7800 Pure hypercholesterolemia, unspecified: Secondary | ICD-10-CM

## 2015-03-09 DIAGNOSIS — G8929 Other chronic pain: Secondary | ICD-10-CM

## 2015-03-09 DIAGNOSIS — R079 Chest pain, unspecified: Secondary | ICD-10-CM

## 2015-03-09 NOTE — Assessment & Plan Note (Signed)
She is limited in her activities by her chronic back pain.  She may benefit from water aerobics for conditioning

## 2015-03-09 NOTE — Assessment & Plan Note (Signed)
We have encouraged continued exercise, careful diet management in an effort to lose weight. 

## 2015-03-09 NOTE — Progress Notes (Signed)
Patient ID: Kathleen Everett, female    DOB: 09-29-1971, 44 y.o.   MRN: [id number]  HPI Comments:  Ms. Feutz is a 44 year old woman with history of obesity, chronic back pain , hyperlipidemia, obstructive sleep apnea , currently not on her CPAP,  History of chronic chest pain dating back to at least 2005 who presents for valuation of chest pain.   She reports that since November 2015, she's had slight worsening of her chest pain.  Chest pain comes on at rest , sometimes with exertion.  In fact in the office today she reports having some aching above her left breast, sometimes on the left side, sometimes on the right side.  Possibly reproducible by rubbing the area.  Denies having regular chest pain with exertion.  She is relatively sedentary, limited by her chronic back pain.  She reports her children help her around the house her  Chores.   She had a cardiac catheterization in 2005 showing no significant coronary artery disease , normal ejection fraction  Stress test was done prior to that with nonspecific ST changes noted, mild chest pain with walking. She achieved 10 METS   Since then she has had echocardiogram and stress test last year with Dr. Lavera Guise.  These were reportedly normal.   she denies any smoking history, no diabetes   Father died at age 54, had bypass surgery at 45 also had carotid disease. He is not a smoker   EKG on today's visit shows normal sinus rhythm with rate 68 bpm, no significant ST or T-wave changes   Allergies  Allergen Reactions  . Ceftin [Cefuroxime Axetil]     Upset stomach  . Celexa [Citalopram Hydrobromide] Other (See Comments)    Intolerance   . Penicillins Rash  . Sulfa Antibiotics Rash  . Vancocin [Vancomycin] Rash    Outpatient Encounter Prescriptions as of 03/09/2015  Medication Sig  . atorvastatin (LIPITOR) 20 MG tablet Take 1 tablet (20 mg total) by mouth daily.  . baclofen (LIORESAL) 10 MG tablet Take 5 mg by mouth daily as  needed for muscle spasms.  . cholecalciferol (VITAMIN D) 1000 UNITS tablet Take 2,000 Units by mouth daily.  . cyanocobalamin (,VITAMIN B-12,) 1000 MCG/ML injection INJECT ONE ML (CC) INTRAMUSCULARLY ONCE EVERY MONTH  . esomeprazole (NEXIUM) 40 MG capsule Take 1 capsule (40 mg total) by mouth 2 (two) times daily before a meal.  . estradiol (ESTRACE) 1 MG tablet TAKE ONE TABLET BY MOUTH ONCE DAILY  . ibuprofen (ADVIL,MOTRIN) 800 MG tablet Take 800 mg by mouth 2 (two) times daily as needed.  . Magnesium Oxide 400 (240 MG) MG TABS One tablet daily  . Nutritional Supplements (JUICE PLUS FIBRE PO) Take by mouth.  . oxyCODONE-acetaminophen (PERCOCET) 10-325 MG per tablet Take 1 tablet by mouth every 8 (eight) hours as needed for pain.   . Probiotic Product (PROBIOTIC DAILY PO) Take by mouth.  . SUMAtriptan (IMITREX) 50 MG tablet Take 50 mg by mouth as needed for migraine or headache. May repeat in 2 hours if headache persists or recurs.  . topiramate (TOPAMAX) 50 MG tablet Take 50 mg by mouth 2 (two) times daily.  . Vortioxetine HBr 10 MG TABS Take 10 mg by mouth every morning.  . [DISCONTINUED] albuterol (PROVENTIL HFA;VENTOLIN HFA) 108 (90 BASE) MCG/ACT inhaler Inhale 2 puffs into the lungs every 6 (six) hours as needed for wheezing or shortness of breath. (Patient not taking: Reported on 03/09/2015)  . [DISCONTINUED] buPROPion (WELLBUTRIN SR) 200  MG 12 hr tablet TAKE ONE TABLET BY MOUTH ONCE DAILY (Patient not taking: Reported on 03/09/2015)  . [DISCONTINUED] escitalopram (LEXAPRO) 10 MG tablet Take 5 mg by mouth daily.  . [DISCONTINUED] ondansetron (ZOFRAN) 4 MG tablet Take 1 tablet (4 mg total) by mouth every 8 (eight) hours as needed for nausea or vomiting. (Patient not taking: Reported on 03/09/2015)    Past Medical History  Diagnosis Date  . Hyperlipidemia   . Chronic tension headaches   . Dysmenorrhea   . Hidradenitis   . Endometriosis   . Depression   . Osteoarthritis   . Obesity   .  Ovarian cyst     hx  . GERD (gastroesophageal reflux disease)   . Chiari malformation     s/p sgy decompression    Past Surgical History  Procedure Laterality Date  . Cystoscopy      bladder ulcers  . Cesarean section      1999/2004 twins  . Temporomandibular joint surgery  2000  . Ovarian cyst removal  2001    right  . Breast biopsy      fibroadenoma  . Hernia repair  2003  . Tubal ligation      Bilateral  . Abdominal hysterectomy  03/27/07    laparoscopic  . Chiari decompression      Social History  reports that she has never smoked. She has never used smokeless tobacco. She reports that she drinks alcohol. She reports that she does not use illicit drugs.  Family History family history includes Anxiety disorder in her father and mother; Hyperlipidemia in her father and mother; Hypertension in her father; Stroke in her father. There is no history of Breast cancer or Colon cancer.   Review of Systems  Constitutional: Negative.   Respiratory: Negative.   Cardiovascular: Positive for chest pain.  Gastrointestinal: Negative.   Musculoskeletal: Negative.   Skin: Negative.   Neurological: Negative.   Hematological: Negative.   Psychiatric/Behavioral: Negative.   All other systems reviewed and are negative.   BP 110/70 mmHg  Pulse 68  Ht 5\' 11"  (1.803 m)  Wt 256 lb 12 oz (116.461 kg)  BMI 35.83 kg/m2  LMP 12/08/2006  Physical Exam  Constitutional: She is oriented to person, place, and time. She appears well-developed and well-nourished.  HENT:  Head: Normocephalic.  Nose: Nose normal.  Mouth/Throat: Oropharynx is clear and moist.  Eyes: Conjunctivae are normal. Pupils are equal, round, and reactive to light.  Neck: Normal range of motion. Neck supple. No JVD present.  Cardiovascular: Normal rate, regular rhythm, S1 normal, S2 normal, normal heart sounds and intact distal pulses.  Exam reveals no gallop and no friction rub.   No murmur heard. Pulmonary/Chest:  Effort normal and breath sounds normal. No respiratory distress. She has no wheezes. She has no rales. She exhibits no tenderness.  Abdominal: Soft. Bowel sounds are normal. She exhibits no distension. There is no tenderness.  Musculoskeletal: Normal range of motion. She exhibits no edema or tenderness.  Lymphadenopathy:    She has no cervical adenopathy.  Neurological: She is alert and oriented to person, place, and time. Coordination normal.  Skin: Skin is warm and dry. No rash noted. No erythema.  Psychiatric: She has a normal mood and affect. Her behavior is normal. Judgment and thought content normal.    Assessment and Plan  Nursing note and vitals reviewed.

## 2015-03-09 NOTE — Assessment & Plan Note (Signed)
Long history of chronic chest pain with cardiac catheterization back in 2005 showing normal coronary arteries.  Stress test last year with echocardiogram at that time.  Symptoms are atypical in nature,  Sounds musculoskeletal.  Unable to exclude myalgias from Lipitor.  Recommended she could try to hold the Lipitor for several weeks to see if this improves her symptoms.  For now no testing has been ordered given that symptoms are atypical in nature.  If symptoms do get worse,  Treadmill stress echocardiogram could be ordered.  She'll call our office if symptoms get worse.  Given no smoking, no diabetes , Well controlled cholesterol, she is low risk of worsening coronary artery disease.  None seen 10 years ago

## 2015-03-09 NOTE — Assessment & Plan Note (Signed)
We have  Requested previous echocardiogram done last year at outside office. No significant murmur appreciated on exam

## 2015-03-09 NOTE — Patient Instructions (Signed)
You are doing well. No medication changes were made.  Please call if symptoms get worse A stress echo test could be ordered  Please call us if you have new issues that need to be addressed before your next appt.

## 2015-03-09 NOTE — Assessment & Plan Note (Signed)
Lipid numbers relatively well-controlled on Lipitor  Unclear if this is contributing to any of her muscle ache, myalgias.  She could do a trial hold for several weeks to see if symptoms improve

## 2015-03-10 ENCOUNTER — Encounter: Payer: Self-pay | Admitting: Internal Medicine

## 2015-03-10 ENCOUNTER — Ambulatory Visit (INDEPENDENT_AMBULATORY_CARE_PROVIDER_SITE_OTHER): Payer: Medicaid Other | Admitting: Internal Medicine

## 2015-03-10 VITALS — BP 116/78 | HR 68 | Temp 98.2°F | Resp 14 | Ht 69.5 in | Wt 257.6 lb

## 2015-03-10 DIAGNOSIS — N951 Menopausal and female climacteric states: Secondary | ICD-10-CM

## 2015-03-10 DIAGNOSIS — G8929 Other chronic pain: Secondary | ICD-10-CM

## 2015-03-10 DIAGNOSIS — R51 Headache: Secondary | ICD-10-CM

## 2015-03-10 DIAGNOSIS — R232 Flushing: Secondary | ICD-10-CM

## 2015-03-10 DIAGNOSIS — F439 Reaction to severe stress, unspecified: Secondary | ICD-10-CM

## 2015-03-10 DIAGNOSIS — R519 Headache, unspecified: Secondary | ICD-10-CM

## 2015-03-10 DIAGNOSIS — Z658 Other specified problems related to psychosocial circumstances: Secondary | ICD-10-CM

## 2015-03-10 DIAGNOSIS — R21 Rash and other nonspecific skin eruption: Secondary | ICD-10-CM | POA: Diagnosis not present

## 2015-03-10 DIAGNOSIS — M549 Dorsalgia, unspecified: Secondary | ICD-10-CM | POA: Diagnosis not present

## 2015-03-10 LAB — SEDIMENTATION RATE: Sed Rate: 29 mm/hr — ABNORMAL HIGH (ref 0–22)

## 2015-03-10 NOTE — Progress Notes (Signed)
Patient ID: Kathleen Everett, female   DOB: 03/09/1971, 43 y.o.   MRN: [id number]   Subjective:    Patient ID: Kathleen Everett, female    DOB: 10/21/1971, 43 y.o.   MRN: [id number]  HPI  Patient here as a work in with concerns regarding intermittent episodes of feeling hot and face being red.  She reports this is getting more frequent.  Occurs most days now.  Noted to have persistent erythema - cheeks.  Describes intermittent "hot" sensation.  States may be hot flash.  No known triggers.  Breathing stable.  No cardiac symptoms with increased activity reported.  Eating and drinking well.  No nausea or vomiting.  No bowel change.     Past Medical History  Diagnosis Date  . Hyperlipidemia   . Chronic tension headaches   . Dysmenorrhea   . Hidradenitis   . Endometriosis   . Depression   . Osteoarthritis   . Obesity   . Ovarian cyst     hx  . GERD (gastroesophageal reflux disease)   . Chiari malformation     s/p sgy decompression    Outpatient Encounter Prescriptions as of 03/10/2015  Medication Sig  . atorvastatin (LIPITOR) 20 MG tablet Take 1 tablet (20 mg total) by mouth daily.  . baclofen (LIORESAL) 10 MG tablet Take 5 mg by mouth daily as needed for muscle spasms.  . cholecalciferol (VITAMIN D) 1000 UNITS tablet Take 2,000 Units by mouth daily.  . cyanocobalamin (,VITAMIN B-12,) 1000 MCG/ML injection INJECT ONE ML (CC) INTRAMUSCULARLY ONCE EVERY MONTH  . esomeprazole (NEXIUM) 40 MG capsule Take 1 capsule (40 mg total) by mouth 2 (two) times daily before a meal.  . estradiol (ESTRACE) 1 MG tablet TAKE ONE TABLET BY MOUTH ONCE DAILY  . ibuprofen (ADVIL,MOTRIN) 800 MG tablet Take 800 mg by mouth 2 (two) times daily as needed.  . Magnesium Oxide 400 (240 MG) MG TABS One tablet daily  . Nutritional Supplements (JUICE PLUS FIBRE PO) Take by mouth.  . oxyCODONE-acetaminophen (PERCOCET) 10-325 MG per tablet Take 1 tablet by mouth every 8 (eight) hours as needed for pain.   .  Probiotic Product (PROBIOTIC DAILY PO) Take by mouth.  . SUMAtriptan (IMITREX) 50 MG tablet Take 50 mg by mouth as needed for migraine or headache. May repeat in 2 hours if headache persists or recurs.  . topiramate (TOPAMAX) 50 MG tablet Take 50 mg by mouth 2 (two) times daily.  . Vortioxetine HBr 10 MG TABS Take 10 mg by mouth every morning.    Review of Systems  Constitutional: Negative for fever, appetite change and unexpected weight change.  HENT: Negative for congestion and sinus pressure.   Respiratory: Negative for cough, chest tightness and shortness of breath.   Cardiovascular: Negative for chest pain, palpitations and leg swelling.  Gastrointestinal: Negative for nausea, vomiting and abdominal pain.  Genitourinary: Negative for dysuria.  Musculoskeletal:       Chronic back pain and aching.  No actual joint swelling.   Neurological: Positive for headaches. Negative for dizziness and light-headedness.  Psychiatric/Behavioral: Negative for dysphoric mood and agitation.       Objective:    Physical Exam  Constitutional: She appears well-developed and well-nourished. No distress.  HENT:  Nose: Nose normal.  Mouth/Throat: Oropharynx is clear and moist.  Neck: Neck supple. No thyromegaly present.  Cardiovascular: Normal rate and regular rhythm.   Pulmonary/Chest: Breath sounds normal. No respiratory distress. She has no wheezes.  Abdominal: Soft. Bowel   sounds are normal. There is no tenderness.  Musculoskeletal: She exhibits no edema or tenderness.  Lymphadenopathy:    She has no cervical adenopathy.  Skin:  Bilateral facial (cheeks) - erythema  Psychiatric: She has a normal mood and affect. Her behavior is normal.    BP 116/78 mmHg  Pulse 68  Temp(Src) 98.2 F (36.8 C) (Oral)  Resp 14  Ht 5' 9.5" (1.765 m)  Wt 257 lb 9.6 oz (116.847 kg)  BMI 37.51 kg/m2  SpO2 99%  LMP 12/08/2006 Wt Readings from Last 3 Encounters:  03/10/15 257 lb 9.6 oz (116.847 kg)  03/09/15  256 lb 12 oz (116.461 kg)  02/14/15 260 lb (117.935 kg)     Lab Results  Component Value Date   WBC 5.6 10/17/2014   HGB 12.3 10/17/2014   HCT 37.4 10/17/2014   PLT 219.0 10/17/2014   GLUCOSE 87 02/21/2015   CHOL 180 02/21/2015   TRIG 143.0 02/21/2015   HDL 41.70 02/21/2015   LDLDIRECT 152.6 06/30/2014   LDLCALC 110* 02/21/2015   ALT 21 02/21/2015   AST 17 02/21/2015   NA 140 02/21/2015   K 4.4 02/21/2015   CL 107 02/21/2015   CREATININE 0.85 02/21/2015   BUN 12 02/21/2015   CO2 28 02/21/2015   TSH 2.05 02/21/2015       Assessment & Plan:   Problem List Items Addressed This Visit    Chronic back pain    Persistent chronic pain.  Seeing specialist.  Stable.        Headache    Seeing Dr Manuella Ghazi.  On topamax and uses imitrex prn.  Feels stable.  Given her age and history of migraine headaches, I would like to get her off her estrogen.  We discussed other possible treatment options for the hot sensation if is menopause.  Will hold on any other medication at this time.  Went over estrogen tapering schedule with pt.        Hot flashes    Symptoms may be c/w hot flashes.  Recent FSH elevated - on 74m of estradiol.  We discussed increasing the dose of her estrogen.  Given her age and history of migraine headaches, I would like to taper her off estrogen.  Unsure if this will worsen her symptoms.  We discussed other treatment options for her hot flashes.  With the erythema (cheeks), check esr and ANA.  Further w/up and treatment pending.  Just started new medication - Brintellix.  Follow.       Stress    Seeing Dr WJimmye Normannot.  Just started a Brintellix.  Is continuing to f/u with psych.         Other Visit Diagnoses    Rash    -  Primary    Relevant Orders    ANA    Sedimentation rate (Completed)      I spent 25 minutes with the patient and more than 50% of the time was spent in consultation regarding the above.     SEinar Pheasant MD

## 2015-03-10 NOTE — Progress Notes (Signed)
Pre visit review using our clinic review tool, if applicable. No additional management support is needed unless otherwise documented below in the visit note. 

## 2015-03-10 NOTE — Patient Instructions (Addendum)
Taper estrogen as we discussed.    Call Dr Manuella Ghazi for follow up.

## 2015-03-12 ENCOUNTER — Encounter: Payer: Self-pay | Admitting: Internal Medicine

## 2015-03-12 DIAGNOSIS — R232 Flushing: Secondary | ICD-10-CM | POA: Insufficient documentation

## 2015-03-12 HISTORY — DX: Flushing: R23.2

## 2015-03-12 NOTE — Assessment & Plan Note (Signed)
Seeing Dr Manuella Ghazi.  On topamax and uses imitrex prn.  Feels stable.  Given her age and history of migraine headaches, I would like to get her off her estrogen.  We discussed other possible treatment options for the hot sensation if is menopause.  Will hold on any other medication at this time.  Went over estrogen tapering schedule with pt.

## 2015-03-12 NOTE — Consult Note (Signed)
Pt with abd pain across mid lower abd, most tenderness in LLQ.  CT non diagnostic.  Pt had small amt of BRB with bowel movement in water and on paper.  Will prep for a colonoscopy with Dr. Candace Cruise for Monday.  She has a hx of IBS.  She has chronic back pain and is on disability with Workmens Comp.  She is to see a doctor in [place] next Friday to consider a spinal stimulator for pain control.  She could have colon inflammation not showing on CT, could have bad case of IBS with outlet hemorrhoidal bleeding.  Could have a case of narcotic bowel with chronic abd pain.    Electronic Signatures: Manya Silvas (MD)  (Signed on 04-May-13 10:37)  Authored  Last Updated: 04-May-13 10:37 by Manya Silvas (MD)

## 2015-03-12 NOTE — Consult Note (Signed)
PATIENT NAME:  Kathleen Everett, Kathleen Everett MR#:  [id number] DATE OF BIRTH:  1971/11/04  DATE OF CONSULTATION:  03/19/2012  REFERRING PHYSICIAN:   CONSULTING PHYSICIAN:  Lupita Dawn. Candace Cruise, MD  REASON FOR REFERRAL: Possible diverticulitis.   DISCUSSION: The patient is a 44 year old white female with a history of chronic back pain and reflux who has been followed by Dr. Vira Agar and Dawson Bills in the past. She has been having intermittent left lower quadrant abdominal pain and some on the right side as well for a few weeks, but has got worse in the last two days or so associated with nausea, vomiting, and multiple episodes of diarrhea. The patient received both morphine and Dilaudid for pain control. CT suggests some evidence of diverticulitis of the sigmoid colon. The patient, as a result, was admitted and given antibiotics. Gastroenterology was consulted for follow-up evaluation.   According to the patient, the patient has had issues with abdominal pain on and off since 2010. The last time she saw Dr. Vira Agar and Dawson Bills was sometime last year. When she would get sick, she would take a few days of Cipro and Flagyl and usually the symptoms would gradually improve over time. She has also noticed a little bit of blood in the stool as well, but denies having any significant fevers or chills.   She has been off work for the last two years because of chronic back pain. She is on worker's compensation. As a results, she has not really been followed up in medicine that much.   When I reviewed her prior evaluation, Dr. Vira Agar did an upper endoscopy and colonoscopy in 2010. At that time she was having left lower quadrant pain. Interestingly, her colon was completely normal except for some small internal hemorrhoids. There is no evidence of diverticulosis mentioned. She also has some chronic gastritis as well for which she was treated.  Fortunately for her, she denies having any heartburn, indigestion or cramping recently after  eating.   PAST MEDICAL HISTORY:  1. Chronic back pain related to degenerative disk disease. She is on chronic back pain medications. 2. History of depression and anxiety.  3. Hyperlipidemia.   HOME MEDICATIONS: 1. Estrogen. 2. Flexeril. 3. Percocet. 4. Prilosec. 5. Zocor. 6. Wellbutrin.   ALLERGIES: She is allergic to penicillin, sulfa, Ultram, vancomycin, Celexa.    SOCIAL HISTORY: She no longer works. She denies any alcohol or smoking use.   PAST SURGICAL HISTORY:  1. 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  2. Temporomandibular joint surgery. 3. Hysterectomy. 4. C-section.  5. Tubal ligation.  FAMILY HISTORY:  Notable for hypertension, chronic obstructive pulmonary disease, heart disease, hyperlipidemia.   REVIEW OF SYSTEMS: There is really no change from the initial review of systems dictated on 05/01. Please refer to that for information.   PHYSICAL EXAMINATION:  GENERAL: The patient is in mild distress because of the abdominal pain. She is afebrile.   VITAL SIGNS: Temperature is 98.9 this morning, pulse 91, respirations 20, blood pressure 99/63, pulse ox 96 on room air.   HEENT: Normocephalic, atraumatic head. Pupils are equally reactive. Throat was clear.   NECK: Supple.   CARDIAC: Regular rhythm and rate. I did not appreciate any murmurs.   LUNGS: Lungs are clear bilaterally.   ABDOMEN: Normoactive bowel sounds, soft. She had active bowel sounds. There is definite tenderness in the left lower quadrant and some in the right lower quadrant area as well. There is no tenderness in the upper abdomen. There is  no rebound or guarding. There is no hepatomegaly.   EXTREMITIES: No clubbing, cyanosis, or edema.   SKIN: Appears relatively normal. She had no neurological deficits on exam.   LABORATORY, DIAGNOSTIC, AND RADIOLOGICAL DATA: Sodium 139, potassium 3.6, chloride 105, CO2 26, BUN 6, creatinine 0.83, cholesterol level is elevated at 235, triglycerides 258.  Liver enzymes are normal. White count is 7.1 today, was 11.2 yesterday. Her hemoglobin is 12.0 and platelet count 214. On CT scan, the sigmoid colon was incompletely distended. There is a question of either thickening or pericolonic inflammatory changes, but it is not really obvious.   IMPRESSION/RECOMMENDATIONS: This is a patient with recurrent low abdominal pain. The history suggests diverticulitis. However, there is no definitive confirmation of this either on colonoscopy or CT scan. I do agree with IV hydration, some pain control and use of antibiotics for the next few days to see whether her symptoms improve. If the pain does not improve quickly, I am more inclined to repeat the colonoscopy to see whether the patient truly has diverticulitis or not. I will continue to follow the patient. Thank you for the referral.  ____________________________ Lupita Dawn. Candace Cruise, MD pyo:ap D: 03/19/2012 12:41:59 ET T: 03/19/2012 14:12:11 ET JOB#: [id number]  cc: Lupita Dawn. Candace Cruise, MD, <Dictator> Lupita Dawn Harles Evetts MD ELECTRONICALLY SIGNED 03/20/2012 11:13

## 2015-03-12 NOTE — Consult Note (Signed)
Full consult to follow. Pt with recurrent abd pain, LLQ>RLQ assoc with diarrhea. ? hx of diverticulitis though never proven. Had normal colon in 9/10. Usually improved with Abx use. Abd tender. Agree with IV Abx and pain meds. If no improvement over next few days, consider repeating colonoscopy to see if patient truly has diverticulitis. Will follow. Thanks.  Electronic Signatures: Verdie Shire (MD)  (Signed on 02-May-13 08:30)  Authored  Last Updated: 02-May-13 08:30 by Verdie Shire (MD)

## 2015-03-12 NOTE — H&P (Signed)
PATIENT NAME:  Kathleen, Everett MR#:  [id number] DATE OF BIRTH:  1971-07-23  DATE OF ADMISSION:  03/18/2012  PRIMARY CARE PHYSICIAN: [name], MD  ER PHYSICIAN: Alger Simons, MD  CHIEF COMPLAINT: Abdominal pain.   HISTORY OF PRESENT ILLNESS: The patient is a 44 year old female patient with history of depression, chronic low back pain, anxiety, GERD, and previous history of diverticulitis who sees Dr. Vira Agar who came in because of abdominal pain. The patient says that she is having left lower quadrant and right lower quadrant abdominal pain for a couple of weeks. Since last night, she is having severe pain in the right lower quadrant and also right upper quadrant associated with nausea, vomiting, and multiple episodes of diarrhea. She says the pain is not radiating to the back, but radiates to the left side, upper and lower quadrants, and has vomited. The patient did receive 6 mg of morphine and 2 mg of Dilaudid and 4 mg of Zofran and still has lots of pain in the upper abdomen and also the back. CT of the abdomen showed diverticulitis of the sigmoid colon. I was asked to admit for that. The patient says that she has been having these episodes of diverticular problems with diverticulitis/colitis since 2010. She sees Dr. Vira Agar. She gets Cipro and Flagyl. After two weeks it gets better. She is careful with her diet and then the symptoms recur. This has been going on for almost a couple of weeks and got worse last night. Diarrhea is mainly whatever she eats and liquid. She has some blood in the stool, but she says she has hemorrhoids and when she wipes she sees blood, but she does not have any frank blood in the stool. Also she complains of lower back pain. She actually says that the pain goes to the left leg. 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 In the past, she  has seen pain management specialist, Dr. Andree Elk, and also received epidural steroid injections, and she uses Percocet. She says with Percocet 7.5/500 mg three times daily pain is a little better in the back, but now it is not helping. She is having abdominal pain and back pain. The patient denies any history of dysuria but has lots of burning when she urinates. This is also going on since yesterday. She  has no cough but feels warm. She did not check for fever.      PAST MEDICAL HISTORY:  1. History of myofascial low back pain, degenerative disk disease at L3-L4, with radicular symptoms. Also history of chronic intractable low back pain of  myofascial   type  nature. The patient did have ablation in the low back and also on opioid maintenance therapy.  2. History of depression and anxiety.  3. History of gastroesophageal reflux disease. 4. History of hyperlipidemia.   MEDICATIONS:  1. Estradiol 1.5 mg once a day.  2. Flexeril 10 mg p.o. three times daily.  3. Percocet 7.5/325 mg one tablet every six hours. 4. Prilosec 40 mg delayed release daily.  5. Simvastatin 20 mg daily. 6. Wellbutrin SR 200 mg p.o. twice a day.   ALLERGIES: Celexa, penicillin, sulfa, Ultram, and vancomycin.   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 No smoking, no alcohol, and no drugs.  PAST SURGICAL HISTORY:  1. History of Chiari malformation surgery for her brain. 2. Two cesarean sections. 3. Temporomandibular joint surgery. 4. Hysterectomy. 5. Tubal ligation.   FAMILY HISTORY: Mother had atrial fibrillation and hyperlipidemia. Grandmother had diverticulitis. Mother also had history of hypertension. Father had chronic obstructive pulmonary disease, congestive heart failure, and hyperlipidemia. Brother had hyperlipidemia.   REVIEW OF SYSTEMS: CONSTITUTIONAL: Complains of severe low back pain. EYES: No blurred vision. ENT: No tinnitus. No epistaxis. No  difficulty swallowing. RESPIRATORY: No cough. CARDIOVASCULAR: No chest pain. No orthopnea. GI: Has nausea, vomiting, abdominal pain, and diarrhea. GENITOURINARY: Has burning on urination, but no hematuria or dysuria. The patient has no incontinence. No history of kidney stones. INTEGUMENT: No skin rashes. MUSCULOSKELETAL: Low back pain which is chronic. No gout.  NEUROLOGIC: No numbness or weakness. No dysarthria. No migraine. PSYCH: Has history of anxiety and depression.   PHYSICAL EXAMINATION:   VITAL SIGNS: Temperature 97.8, pulse 94, respirations 24, blood pressure 107/57, and saturations 100% on room air.  GENERAL: She is alert, awake, and oriented and in moderate distress because of abdominal pain and back pain. She says that morphine, Dilaudid, and Zofran given in the ER helped only a little bit and she is having still nausea and also abdominal pain and is requesting something for pain and nausea.   HEENT: Pupils are equally round and reactive to light. No scleral icterus. No conjunctivitis. Hearing is intact.   NECK: No thyroid enlargement. No JVD. No carotid bruit. No lymphadenopathy.   LUNGS: Clear to auscultation. No wheeze. No rales.   HEART: S1 and S2 regular. No murmurs.   ABDOMEN: The patient does have some generalized tenderness mainly in the right lower quadrant and left lower quadrant. Bowel sounds are diminished. I do not see any organomegaly.   MUSCULOSKELETAL: Low back pain. The patient has tenderness in the lumbar spinal area, paraspinal tenderness present. I do not see any CVA tenderness.   LYMPH NODES: No lymphadenopathy in the cervical or axillary region.   NEUROLOGIC: Oriented to time, place, and person. No cranial nerve deficit. Sensations are intact.   PSYCHIATRIC: She is alert, awake, oriented, and cooperative.  LABS/STUDIES: CAT scan of the abdomen and pelvis with contrast showed the sigmoid colon is incompletely distended and cannot exclude wall thickening and  minimal pericolonic inflammation. Could reflect colitis or diverticulitis. There are a few scattered diverticula. More proximally the colon exhibits no acute abnormality. No small bowel abnormality. Normal appendix. No acute urinary tract or hepatobiliary abnormality.   Electrolytes: Sodium 137, potassium 4.1, chloride 106, bicarbonate 26, BUN 10, creatinine 0.80, and glucose 98. Liver functions within normal limits. CBC: WBC 11.2, hemoglobin 14.2, hematocrit 41.9, and platelets 270. Lipase 188.   Urinalysis: Orange-colored clear urine, no bacteria, and no leukocyte esterase.   ASSESSMENT AND PLAN:  109. A 44 year old female with abdominal pain, nausea, vomiting, diarrhea, and CT evidence of colitis in the sigmoid colon. The patient is going to be admitted to the hospitalist service. She will be getting IV fluids, keep n.p.o., and given IV pain medications with Dilaudid. She is allergic to Ultram, but she did tolerate Dilaudid so we will give Dilaudid 2 mg every four hours along with Zofran and give Cipro and Flagyl. We will see the clinical response. She has seen Dr. Vira Agar before and we will request Dr. Vira Agar to see the patient while she is in the hospital.  2. Chronic low back pain with acute worsening, likely secondary to her abdominal pain  also which is causing more back pain. Continue IV Dilaudid. No evidence of urinary tract abnormality on the CAT scan so Dilaudid at this time.  3. Anxiety and depression. She is n.p.o. Her medication Wellbutrin can be started after she starts to take a diet.  4. GI prophylaxis with Protonix and deep vein thrombosis prophylaxis with Lovenox 40 mg subcutaneous daily.  I discussed the plan with the patient and the patient's mother.  TIME SPENT: About 60 minutes. ____________________________ Epifanio Lesches, MD sk:slb D: 03/18/2012 15:23:40 ET     T: 03/18/2012 15:50:56 ET         JOB#: [id number] cc: Epifanio Lesches, MD, <Dictator> [name],  MD Epifanio Lesches MD ELECTRONICALLY SIGNED 04/01/2012 23:37

## 2015-03-12 NOTE — Assessment & Plan Note (Signed)
Symptoms may be c/w hot flashes.  Recent FSH elevated - on 92m of estradiol.  We discussed increasing the dose of her estrogen.  Given her age and history of migraine headaches, I would like to taper her off estrogen.  Unsure if this will worsen her symptoms.  We discussed other treatment options for her hot flashes.  With the erythema (cheeks), check esr and ANA.  Further w/up and treatment pending.  Just started new medication - Brintellix.  Follow.

## 2015-03-12 NOTE — Discharge Summary (Signed)
PATIENT NAME:  Kathleen Everett, Kathleen Everett MR#:  [id number] DATE OF BIRTH:  08-28-1971  DATE OF ADMISSION:  03/18/2012 DATE OF DISCHARGE:  03/24/2012  DISCHARGE DIAGNOSES:  1. Abdominal pain secondary to sigmoid colon colitis and also descending colon colitis. 2. Chronic low back pain. 3. Depression and anxiety. 4. Seasonal allergies.  DISCHARGE MEDICATIONS:  1. Estradiol 1.5 mg p.o. daily.  2. Kayexalate 10 mg p.o. three times daily. 3. Percocet 7.5/325 mg every six hours. 4.  Prilosec 40 mg p.o. daily.  5. Simvastatin 20 mg daily. 6. Wellbutrin SR 200 mg tablets, which is 12-hour release tablets, p.o. twice a day.  7. Cipro 500 mg p.o. twice a day for three days. 8. Flagyl 500 mg p.o. three times daily for three days.   DISCHARGE FOLLOWUP: The patient can followup with Dr. Candace Cruise in 7 to 10 days and followup with Dr. Andree Elk in the pain clinic 7 to 10 days for her back pain.   CONSULTANTS: 1. Gaylyn Cheers, MD - Gastroenterology. 2. Verdie Shire, MD - Gastroenterology.  PROCEDURES: Colonoscopy and CT of the abdomen.   LABS/STUDIES: Initial urinalysis showed no urinary tract infection.   Lipase 188. WBC on admission 11.2, hemoglobin 14.2, hematocrit 41.9, and platelets 270. Electrolytes on admission: Sodium 137, potassium 4.1, chloride 106, bicarbonate 26, BUN 10, creatinine 0.80, and glucose 98.   Abdominal CAT scan showed sigmoid colon is completely distended and thickening with  minimal pericolonic inflammation of the sigmoid colon reflecting colitis versus diverticulitis. The patient has only a few diverticula.   No acute urinary tract infection or hepatobiliary abnormality.  LDL 148. Liver functions within normal limits. WBC normalized on 03/19/2012. Renal function stayed stable.   Stool for C. difficile has been negative. Stool cultures have been negative for Salmonella, Shigella, and Campylobacter.  Hemoglobin is stable 11.4 and hematocrit 33.7.   Colonoscopy done yesterday showed  moderate inflammation of the sigmoid colon and descending colon. Biopsies were taken. The patient needs to follow up with Dr. Candace Cruise regarding biopsy results. The patient has no inflammation or abnormality in the transverse colon.   HOSPITAL COURSE: The patient is a 44 year old female admitted for abdominal pain, nausea, and vomiting and unable to take p.o. Look at the History and Physical done by me for full details. She was admitted to the hospitalist service for possible colitis and diverticulitis and started on IV fluids along with Cipro, Flagyl, IV pain medication, and IV Zofran. The patient was seen by Dr. Vira Agar and Dr. Candace Cruise. She had a colonoscopy yesterday which showed slight inflammation of the sigmoid and descending colon. The patient feels better than when she came in. Her pain in the left lower quadrant and right lower quadrant is improved, but still there. She has been tolerating a regular diet since yesterday evening after colonoscopy and she wants to go home. I told her to be careful about her diet and for several days watch her diet and followup with Dr. Candace Cruise and continue antibiotics for three more days to finish total 10 days of Cipro and Flagyl and can continue PPIs. Regarding her back pain, she has seen Dr. Andree Elk in the pain control clinic. She can follow up with them. She has been on chronic narcotics and has history of           degenerative disk disease at L3-L4. She is on opioid maintenance therapy. For now we will continue that and I have asked her to followup with Dr. Andree Elk.   TOTAL TIME SPENT:  Approximately 40 minutes on discharge preparation. ____________________________ Epifanio Lesches, MD sk:slb D: 03/24/2012 11:12:17 ET T: 03/24/2012 12:35:26 ET JOB#: [id number]  cc: Epifanio Lesches, MD, <Dictator> Epifanio Lesches MD ELECTRONICALLY SIGNED 03/28/2012 23:04

## 2015-03-12 NOTE — Assessment & Plan Note (Signed)
Seeing Dr Jimmye Norman not.  Just started a Brintellix.  Is continuing to f/u with psych.

## 2015-03-12 NOTE — Assessment & Plan Note (Signed)
Persistent chronic pain.  Seeing specialist.  Stable.

## 2015-03-12 NOTE — Consult Note (Signed)
Chief Complaint:   Subjective/Chief Complaint Abd pain persists. Some rectal bleeding.   VITAL SIGNS/ANCILLARY NOTES: **Vital Signs.:   03-May-13 10:20   Temperature Temperature (F) 96   Celsius 35.5   Temperature Source oral   Pulse Pulse 88   Respirations Respirations 20   Systolic BP Systolic BP 295   Diastolic BP (mmHg) Diastolic BP (mmHg) 83   Mean BP 100   BP Source Dinamap   Pulse Ox % Pulse Ox % 96   Pulse Ox Activity Level  At rest   Oxygen Delivery Room Air/ 21 %   Brief Assessment:   Cardiac Regular    Respiratory clear BS    Gastrointestinal LLQ>RLQ tenderness. Less tender than yest.   Routine Chem:  03-May-13 06:03    Glucose, Serum 81   BUN 4   Creatinine (comp) 0.86   Sodium, Serum 139   Potassium, Serum 3.4   Chloride, Serum 105   CO2, Serum 28   Calcium (Total), Serum 8.5   Osmolality (calc) 273   eGFR (African American) >60   eGFR (Non-African American) >60   Anion Gap 6  Routine Hem:  03-May-13 06:03    WBC (CBC) 5.4   RBC (CBC) 4.05   Hemoglobin (CBC) 12.2   Hematocrit (CBC) 36.1   Platelet Count (CBC) 203   MCV 89   MCH 30.1   MCHC 33.8   RDW 14.6   Neutrophil % 63.2   Lymphocyte % 27.9   Monocyte % 6.8   Eosinophil % 1.9   Basophil % 0.2   Neutrophil # 3.4   Lymphocyte # 1.5   Monocyte # 0.4   Eosinophil # 0.1   Basophil # 0.0   Assessment/Plan:  Assessment/Plan:   Assessment LLQ abd pain.    Plan Continue IV Abx/IVF. Dr. Vira Agar to see patient tomorrow. If sxs do not improve, will consider scheduling colonoscopy on Monday since there is no definite evidence of acute diverticulitis. THanks.   Electronic Signatures: Verdie Shire (MD)  (Signed [phone] 13:39)  Authored: Chief Complaint, VITAL SIGNS/ANCILLARY NOTES, Brief Assessment, Lab Results, Assessment/Plan   Last Updated: 03-May-13 13:39 by Verdie Shire (MD)

## 2015-03-12 NOTE — Consult Note (Signed)
Chief Complaint:   Subjective/Chief Complaint No signif improvement. Feels weak.   VITAL SIGNS/ANCILLARY NOTES: **Vital Signs.:   05-May-13 05:04   Vital Signs Type Routine   Temperature Temperature (F) 98.2   Celsius 36.7   Temperature Source oral   Pulse Pulse 82   Pulse source per Dinamap   Respirations Respirations 19   Systolic BP Systolic BP 475   Diastolic BP (mmHg) Diastolic BP (mmHg) 83   Mean BP 99   BP Source Dinamap   Pulse Ox % Pulse Ox % 95   Pulse Ox Activity Level  At rest   Oxygen Delivery Room Air/ 21 %   Brief Assessment:   Cardiac Regular    Respiratory clear BS    Gastrointestinal lower abd tenderness   Routine Chem:  04-May-13 06:49    Glucose, Serum 95   BUN 3   Creatinine (comp) 0.76   Sodium, Serum 144   Potassium, Serum 3.4   Chloride, Serum 109   CO2, Serum 27   Calcium (Total), Serum 8.2   Osmolality (calc) 283   eGFR (African American) >60   eGFR (Non-African American) >60   Anion Gap 8  Routine Hem:  04-May-13 06:49    WBC (CBC) 4.2   RBC (CBC) 3.80   Hemoglobin (CBC) 11.4   Hematocrit (CBC) 33.7   Platelet Count (CBC) 202   MCV 89   MCH 30.0   MCHC 33.9   RDW 14.6   Neutrophil % 60.7   Lymphocyte % 27.9   Monocyte % 7.9   Eosinophil % 3.1   Basophil % 0.4   Neutrophil # 2.5   Lymphocyte # 1.2   Monocyte # 0.3   Eosinophil # 0.1   Basophil # 0.0   Assessment/Plan:  Assessment/Plan:   Assessment LLQ abd pain.    Plan Plan colonoscopy tomorrow. Thanks   Electronic Signatures: Verdie Shire (MD)  (Signed [phone] 10:56)  Authored: Chief Complaint, VITAL SIGNS/ANCILLARY NOTES, Brief Assessment, Lab Results, Assessment/Plan   Last Updated: 05-May-13 10:56 by Verdie Shire (MD)

## 2015-03-12 NOTE — Consult Note (Signed)
Abd pain overall better. Colonoscopy to transverse colon performed. Anesthesia not available. THerefore, hard to sedate. Signif inflammation in sigmoid colon and some in descending colon. Rectum and transverse colon normal. Multiple biopsies taken. Could be ischemic process but infectious also possible. Ok to try solid food if patient willing. If pain tolerable, patient can be discharged to home on Abx and f/u in our office later. Thanks.  Electronic Signatures: Verdie Shire (MD)  (Signed on 06-May-13 17:37)  Authored  Last Updated: 06-May-13 17:37 by Verdie Shire (MD)

## 2015-03-13 LAB — ANA: ANA: NEGATIVE

## 2015-03-14 ENCOUNTER — Encounter: Payer: Self-pay | Admitting: Internal Medicine

## 2015-03-14 ENCOUNTER — Telehealth: Payer: Self-pay

## 2015-03-14 NOTE — Telephone Encounter (Signed)
I called and spoke with the patient. She states that Dr. Rockey Situ had mentioned potentially starting her on a fluid pill. She is still having some swelling in her feet, but reports this is a little better than when she was here. She would like to try this.  I advised I will forward to Dr. Rockey Situ to review. We will call back with recommendations. She uses [facility] on [address].

## 2015-03-14 NOTE — Telephone Encounter (Signed)
Pt states she would like to start on the water pill. Please call.

## 2015-03-14 NOTE — Telephone Encounter (Signed)
Okay to take Lasix 20 mg as needed, sparingly for leg swelling  Take with potassium 10 mEq Also try leg elevation, compression hose

## 2015-03-15 MED ORDER — FUROSEMIDE 20 MG PO TABS: 20.0000 mg | ORAL_TABLET | Freq: Every day | ORAL | Status: DC | PRN

## 2015-03-15 MED ORDER — POTASSIUM CHLORIDE ER 10 MEQ PO TBCR: 10.0000 meq | EXTENDED_RELEASE_TABLET | Freq: Every day | ORAL | Status: DC | PRN

## 2015-03-15 NOTE — Telephone Encounter (Signed)
Spoke w/ pt.  Advised her of Dr. Gollan's recommendation. She verbalizes understanding and will call back w/ any questions or concerns.  

## 2015-04-18 ENCOUNTER — Telehealth: Payer: Self-pay | Admitting: *Deleted

## 2015-04-18 NOTE — Telephone Encounter (Signed)
Fax from [place], needing PA for Esomeprazole. Started online, sent pt mychart.

## 2015-04-19 NOTE — Telephone Encounter (Signed)
PA started, placed in Scott's box for completion.

## 2015-04-24 ENCOUNTER — Encounter: Payer: Self-pay | Admitting: Internal Medicine

## 2015-04-24 ENCOUNTER — Encounter: Payer: Self-pay | Admitting: Psychiatry

## 2015-04-24 ENCOUNTER — Ambulatory Visit (INDEPENDENT_AMBULATORY_CARE_PROVIDER_SITE_OTHER): Payer: Medicaid Other | Admitting: Psychiatry

## 2015-04-24 ENCOUNTER — Encounter: Payer: Medicaid Other | Admitting: Internal Medicine

## 2015-04-24 VITALS — BP 122/82 | HR 71 | Temp 97.5°F | Ht 71.0 in | Wt 248.0 lb

## 2015-04-24 DIAGNOSIS — L732 Hidradenitis suppurativa: Secondary | ICD-10-CM | POA: Insufficient documentation

## 2015-04-24 DIAGNOSIS — F331 Major depressive disorder, recurrent, moderate: Secondary | ICD-10-CM | POA: Diagnosis not present

## 2015-04-24 DIAGNOSIS — N809 Endometriosis, unspecified: Secondary | ICD-10-CM | POA: Insufficient documentation

## 2015-04-24 DIAGNOSIS — G44221 Chronic tension-type headache, intractable: Secondary | ICD-10-CM

## 2015-04-24 DIAGNOSIS — R609 Edema, unspecified: Secondary | ICD-10-CM

## 2015-04-24 DIAGNOSIS — R232 Flushing: Secondary | ICD-10-CM

## 2015-04-24 DIAGNOSIS — N946 Dysmenorrhea, unspecified: Secondary | ICD-10-CM | POA: Insufficient documentation

## 2015-04-24 HISTORY — DX: Chronic tension-type headache, intractable: G44.221

## 2015-04-24 MED ORDER — FLUOXETINE HCL 40 MG PO CAPS: 40.0000 mg | ORAL_CAPSULE | Freq: Every day | ORAL | Status: DC

## 2015-04-24 NOTE — Progress Notes (Addendum)
BH MD/PA/NP OP Progress Note  04/24/2015 9:02 AM Kathleen Everett  MRN:  [id number]  Subjective:  Presents for follow-up of her depression. She was last seen on 03/23/2015 and started on Prozac. She indicates she is tolerating the medication fairly well. She states she does have some GI upset however she has had this even before being on the Prozac and she's not sure the Prozac has worsened this at all. She states because she has had some positive guaiac stool cards she has been scheduled for an endoscopy and colonoscopy. She stated that she perhaps has noticed a slight difference with the Prozac but she does not see it as a tremendous difference. She states that she is sleeping fairly well and averaging 5 hours a night. She states that her appetite is been poor and relates that sometimes she is anxious such as this morning and does not eat. She states that in regards to enjoying things that spin fair. She states she generally feels like she just puts on a happy face for others and tries not to bring others down.  He asked about follow-up for her neuropsychology assessment for memory problems. This Probation officer informed her that I spoke with the therapist that had referred her and that the neuropsychology evaluation she was referred to does not accept her insurance. Patient stated she would be fine which is seeing a therapist for therapy. I indicated I would refer to therapist in this office.  Should there be  issues with Prozac the remaining alternatives might be a trial of Pristiq were vibrated. Additionally might try to augment with Wellbutrin as patient had been on this medication for a long time but relates it stopped working. Chief Complaint:  Chief Complaint    Depression     Visit Diagnosis:     ICD-9-CM ICD-10-CM   1. Major depressive disorder, recurrent episode, moderate 296.32 F33.1 FLUoxetine (PROZAC) 40 MG capsule     DISCONTINUED: FLUoxetine (PROZAC) 20 MG capsule    Past Medical  History:  Past Medical History  Diagnosis Date  . Hyperlipidemia   . Chronic tension headaches   . Dysmenorrhea   . Hidradenitis   . Endometriosis   . Depression   . Osteoarthritis   . Obesity   . Ovarian cyst     hx  . GERD (gastroesophageal reflux disease)   . Chiari malformation     s/p sgy decompression  . Back pain     Past Surgical History  Procedure Laterality Date  . Cystoscopy      bladder ulcers  . Cesarean section      1999/2004 twins  . Temporomandibular joint surgery  2000  . Ovarian cyst removal  2001    right  . Breast biopsy      fibroadenoma  . Hernia repair  2003  . Tubal ligation      Bilateral  . Abdominal hysterectomy  03/27/07    laparoscopic  . Chiari decompression     Family History:  Family History  Problem Relation Age of Onset  . Hyperlipidemia Mother   . Anxiety disorder Mother   . Anxiety disorder Father   . Stroke Father   . Hyperlipidemia Father   . Hypertension Father   . Congestive Heart Failure Father   . Breast cancer Neg Hx   . Colon cancer Paternal Aunt    Social History:  History   Social History  . Marital Status: Married    Spouse Name: N/A  . Number  of Children: 3  . Years of Education: N/A   Social History Main Topics  . Smoking status: Never Smoker   . Smokeless tobacco: Never Used  . Alcohol Use: No     Comment: rare  . Drug Use: No  . Sexual Activity: Yes    Birth Control/ Protection: None   Other Topics Concern  . None   Social History Narrative   Additional History:   Assessment:   Musculoskeletal: Strength & Muscle Tone: within normal limits Gait & Station: normal Patient leans: N/A  Psychiatric Specialty Exam: HPI  Review of Systems  Psychiatric/Behavioral: Positive for depression and memory loss. Negative for suicidal ideas, hallucinations and substance abuse. The patient has insomnia (Patient indicated that she sleeps an average of 5 hours a night but sometimes does get up at night).  The patient is not nervous/anxious.     Blood pressure 122/82, pulse 71, temperature 97.5 F (36.4 C), height 5\' 11"  (1.803 m), weight 248 lb (112.492 kg), last menstrual period 12/08/2006, SpO2 98 %.Body mass index is 34.6 kg/(m^2).  General Appearance: Well Groomed  Eye Contact:  Good  Speech:  Clear and Coherent  Volume:  Normal  Mood:  Depressed and "Same"  Affect:  Constricted  Thought Process:  Linear  Orientation:  Full (Time, Place, and Person)  Thought Content:  Negative  Suicidal Thoughts:  No  Homicidal Thoughts:  No  Memory:  Immediate;   Good Recent;   Fair Remote;   Poor  Judgement:  Good  Insight:  Good  Psychomotor Activity:  Normal  Concentration:  Good  Recall:  Poor  Fund of Knowledge: Good  Language: Good  Akathisia:  Negative  Handed:  Right unknown  AIMS (if indicated):  Not done  Assets:  Communication Skills Desire for Improvement  ADL's:  Intact  Cognition: WNL  Sleep:  As above, fair   Is the patient at risk to self?  No. Has the patient been a risk to self in the past 6 months?  No. Has the patient been a risk to self within the distant past?  No. Is the patient a risk to others?  No. Has the patient been a risk to others in the past 6 months?  No. Has the patient been a risk to others within the distant past?  No.  Current Medications: Current Outpatient Prescriptions  Medication Sig Dispense Refill  . baclofen (LIORESAL) 10 MG tablet Take 5 mg by mouth daily as needed for muscle spasms.    . cholecalciferol (VITAMIN D) 1000 UNITS tablet Take 2,000 Units by mouth daily.    . cyanocobalamin (,VITAMIN B-12,) 1000 MCG/ML injection INJECT ONE ML (CC) INTRAMUSCULARLY ONCE EVERY MONTH 1 mL 5  . esomeprazole (NEXIUM) 40 MG capsule Take 1 capsule (40 mg total) by mouth 2 (two) times daily before a meal. 60 capsule 2  . FLUoxetine (PROZAC) 40 MG capsule Take 1 capsule (40 mg total) by mouth daily. 30 capsule 1  . furosemide (LASIX) 20 MG tablet Take  1 tablet (20 mg total) by mouth daily as needed for edema. 30 tablet 6  . ibuprofen (ADVIL,MOTRIN) 800 MG tablet Take 800 mg by mouth 2 (two) times daily as needed.    . Magnesium Oxide 400 (240 MG) MG TABS One tablet daily 30 tablet 1  . Nutritional Supplements (JUICE PLUS FIBRE PO) Take by mouth.    . oxyCODONE-acetaminophen (PERCOCET) 10-325 MG per tablet Take 1 tablet by mouth every 8 (eight) hours as  needed for pain.     . potassium chloride (K-DUR) 10 MEQ tablet Take 1 tablet (10 mEq total) by mouth daily as needed (with lasix). 30 tablet 6  . SUMAtriptan (IMITREX) 50 MG tablet Take 50 mg by mouth as needed for migraine or headache. May repeat in 2 hours if headache persists or recurs.    . topiramate (TOPAMAX) 50 MG tablet Take 50 mg by mouth 2 (two) times daily.    Marland Kitchen atorvastatin (LIPITOR) 20 MG tablet Take 1 tablet (20 mg total) by mouth daily. (Patient not taking: Reported on 04/24/2015) 30 tablet 5  . estradiol (ESTRACE) 1 MG tablet TAKE ONE TABLET BY MOUTH ONCE DAILY (Patient not taking: Reported on 04/24/2015) 30 tablet 5  . potassium chloride (K-DUR,KLOR-CON) 10 MEQ tablet Take by mouth.    . Probiotic Product (PROBIOTIC DAILY PO) Take by mouth.    . Vortioxetine HBr 10 MG TABS Take 10 mg by mouth every morning.     No current facility-administered medications for this visit.    Medical Decision Making:  Established Problem, Stable/Improving (1)  Treatment Plan Summary:Medication management We will increase fluoxetine from 20 mg to 40 mg. Patient will follow up in 4 weeks.  Seeing therapist on 05/01/2015. When she checked and at that she left this writer a message that she was having upset stomach on the Prozac 40 mg. Briefly spoke with patient indicated that she could give it a try for another few days and if it continued to disturb her we would return to the 20 mg a day. Of note the patient is having GI workup for GI issues and thus we discussed how this can interfere with [place]  assessing for the contribution of the medication for nausea and vomiting. Patient was in agreement with this plan. 05/01/15.   Faith Rogue 04/24/2015, 9:02 AM

## 2015-04-25 NOTE — Telephone Encounter (Signed)
Order placed for gyn referral.  Met b ordered.

## 2015-04-28 ENCOUNTER — Encounter: Payer: Self-pay | Admitting: Anesthesiology

## 2015-04-28 ENCOUNTER — Encounter
Admission: RE | Disposition: A | Discharge status: Home / Self Care | Payer: Self-pay | Source: Ambulatory Visit | Attending: Unknown Physician Specialty

## 2015-04-28 ENCOUNTER — Ambulatory Visit
Admission: RE | Admit: 2015-04-28 | Discharge: 2015-04-28 | Disposition: A | Discharge status: Home / Self Care | Payer: Medicaid Other | Source: Ambulatory Visit | Attending: Unknown Physician Specialty | Admitting: Unknown Physician Specialty

## 2015-04-28 ENCOUNTER — Ambulatory Visit: Payer: Medicaid Other | Admitting: Anesthesiology

## 2015-04-28 DIAGNOSIS — Z880 Allergy status to penicillin: Secondary | ICD-10-CM | POA: Diagnosis not present

## 2015-04-28 DIAGNOSIS — E785 Hyperlipidemia, unspecified: Secondary | ICD-10-CM | POA: Diagnosis not present

## 2015-04-28 DIAGNOSIS — K640 First degree hemorrhoids: Secondary | ICD-10-CM | POA: Insufficient documentation

## 2015-04-28 DIAGNOSIS — M549 Dorsalgia, unspecified: Secondary | ICD-10-CM | POA: Diagnosis not present

## 2015-04-28 DIAGNOSIS — Z9109 Other allergy status, other than to drugs and biological substances: Secondary | ICD-10-CM | POA: Insufficient documentation

## 2015-04-28 DIAGNOSIS — I341 Nonrheumatic mitral (valve) prolapse: Secondary | ICD-10-CM | POA: Insufficient documentation

## 2015-04-28 DIAGNOSIS — K2970 Gastritis, unspecified, without bleeding: Secondary | ICD-10-CM | POA: Diagnosis not present

## 2015-04-28 DIAGNOSIS — G4730 Sleep apnea, unspecified: Secondary | ICD-10-CM | POA: Insufficient documentation

## 2015-04-28 DIAGNOSIS — R911 Solitary pulmonary nodule: Secondary | ICD-10-CM | POA: Insufficient documentation

## 2015-04-28 DIAGNOSIS — F329 Major depressive disorder, single episode, unspecified: Secondary | ICD-10-CM | POA: Diagnosis not present

## 2015-04-28 DIAGNOSIS — Z882 Allergy status to sulfonamides status: Secondary | ICD-10-CM | POA: Insufficient documentation

## 2015-04-28 DIAGNOSIS — Z881 Allergy status to other antibiotic agents status: Secondary | ICD-10-CM | POA: Insufficient documentation

## 2015-04-28 DIAGNOSIS — R195 Other fecal abnormalities: Secondary | ICD-10-CM | POA: Insufficient documentation

## 2015-04-28 DIAGNOSIS — M1990 Unspecified osteoarthritis, unspecified site: Secondary | ICD-10-CM | POA: Insufficient documentation

## 2015-04-28 DIAGNOSIS — K219 Gastro-esophageal reflux disease without esophagitis: Secondary | ICD-10-CM | POA: Diagnosis present

## 2015-04-28 DIAGNOSIS — G8929 Other chronic pain: Secondary | ICD-10-CM | POA: Diagnosis not present

## 2015-04-28 DIAGNOSIS — Z79899 Other long term (current) drug therapy: Secondary | ICD-10-CM | POA: Diagnosis not present

## 2015-04-28 HISTORY — PX: COLONOSCOPY WITH PROPOFOL: SHX5780

## 2015-04-28 HISTORY — PX: ESOPHAGOGASTRODUODENOSCOPY: SHX5428

## 2015-04-28 LAB — HM COLONOSCOPY

## 2015-04-28 SURGERY — EGD (ESOPHAGOGASTRODUODENOSCOPY)
Anesthesia: General

## 2015-04-28 MED ORDER — PROPOFOL INFUSION 10 MG/ML OPTIME
INTRAVENOUS | Status: DC | PRN
  Administered 2015-04-28: 100 ug/kg/min via INTRAVENOUS

## 2015-04-28 MED ORDER — SODIUM CHLORIDE 0.9 % IV SOLN: INTRAVENOUS | Status: DC

## 2015-04-28 MED ORDER — LIDOCAINE HCL (CARDIAC) 20 MG/ML IV SOLN
INTRAVENOUS | Status: DC | PRN
  Administered 2015-04-28: 50 mg via INTRAVENOUS

## 2015-04-28 MED ORDER — PROPOFOL 10 MG/ML IV BOLUS
INTRAVENOUS | Status: DC | PRN
  Administered 2015-04-28 (×2): 50 mg via INTRAVENOUS
  Administered 2015-04-28 (×4): 20 mg via INTRAVENOUS

## 2015-04-28 MED ORDER — LACTATED RINGERS IV SOLN
INTRAVENOUS | Status: DC
  Administered 2015-04-28: 11:00:00 via INTRAVENOUS
  Administered 2015-04-28: 1000 mL via INTRAVENOUS

## 2015-04-28 MED ORDER — ONDANSETRON HCL 4 MG/2ML IJ SOLN
INTRAMUSCULAR | Status: DC | PRN
  Administered 2015-04-28: 4 mg via INTRAVENOUS

## 2015-04-28 MED ORDER — FENTANYL CITRATE (PF) 100 MCG/2ML IJ SOLN
INTRAMUSCULAR | Status: DC | PRN
Start: 2015-04-28 — End: 2015-04-28
  Administered 2015-04-28: 50 ug via INTRAVENOUS

## 2015-04-28 NOTE — Op Note (Signed)
[facility] Gastroenterology Patient Name: Kathleen Everett Procedure Date: 04/28/2015 11:22 AM MRN: [id number] Account #: [id number] Date of Birth: 09/06/1971 Admit Type: Outpatient Age: 44 Room: [facility] ENDO ROOM 1 Gender: Female Note Status: Finalized Procedure:         Upper GI endoscopy Indications:       Heme positive stool, Suspected gastro-esophageal reflux                     disease Providers:         Manya Silvas, MD Referring MD:      [name], MD (Referring MD) Medicines:         Propofol per Anesthesia Complications:     No immediate complications. Procedure:         Pre-Anesthesia Assessment:                    - After reviewing the risks and benefits, the patient was                     deemed in satisfactory condition to undergo the procedure.                    After obtaining informed consent, the endoscope was passed                     under direct vision. Throughout the procedure, the                     patient's blood pressure, pulse, and oxygen saturations                     were monitored continuously. The Endoscope was introduced                     through the mouth, and advanced to the second part of                     duodenum. The upper GI endoscopy was accomplished without                     difficulty. The patient tolerated the procedure well. Findings:      The examined esophagus was normal. GEJ 40cm.      Diffuse moderate inflammation characterized by congestion (edema),       erythema and granularity was found in the gastric body. Biopsies were       taken with a cold forceps for histology. Biopsies were taken with a cold       forceps for Helicobacter pylori testing.      The examined duodenum was normal. Impression:        - Normal esophagus.                    - Gastritis. Biopsied.                    - Normal examined duodenum. Recommendation:    - Await pathology results. Manya Silvas, MD 04/28/2015  11:37:17 AM This report has been signed electronically. Number of Addenda: 0 Note Initiated On: 04/28/2015 11:22 AM      [facility]

## 2015-04-28 NOTE — Anesthesia Postprocedure Evaluation (Signed)
  Anesthesia Post-op Note  Patient: Kathleen Everett  Procedure(s) Performed: Procedure(s): ESOPHAGOGASTRODUODENOSCOPY (EGD) (N/A) COLONOSCOPY WITH PROPOFOL (N/A)  Anesthesia type:General  Patient location: PACU  Post pain: Pain level controlled  Post assessment: Post-op Vital signs reviewed, Patient's Cardiovascular Status Stable, Respiratory Function Stable, Patent Airway and No signs of Nausea or vomiting  Post vital signs: Reviewed and stable  Last Vitals:  Filed Vitals:   04/28/15 1238  BP: 113/67  Pulse: 58  Temp:   Resp: 13    Level of consciousness: awake, alert  and patient cooperative  Complications: No apparent anesthesia complications

## 2015-04-28 NOTE — Transfer of Care (Addendum)
Immediate Anesthesia Transfer of Care Note  Patient: Kathleen Everett  Procedure(s) Performed: Procedure(s): ESOPHAGOGASTRODUODENOSCOPY (EGD) (N/A) COLONOSCOPY WITH PROPOFOL (N/A)  Patient Location: PACU and Endoscopy Unit  Anesthesia Type:General  Level of Consciousness: awake  Airway & Oxygen Therapy: Patient Spontanous Breathing  Post-op Assessment: Report given to RN  Post vital signs: stable  Last Vitals:  Filed Vitals:   04/28/15 1034  BP: 128/82  Pulse: 78  Temp: 36.8 C  Resp: 14   Past Medical History  Diagnosis Date  . Hyperlipidemia   . Chronic tension headaches   . Dysmenorrhea   . Hidradenitis   . Endometriosis   . Depression   . Osteoarthritis   . Obesity   . Ovarian cyst     hx  . GERD (gastroesophageal reflux disease)   . Chiari malformation     s/p sgy decompression  . Back pain    Past Surgical History  Procedure Laterality Date  . Cystoscopy      bladder ulcers  . Cesarean section      1999/2004 twins  . Temporomandibular joint surgery  2000  . Ovarian cyst removal  2001    right  . Breast biopsy      fibroadenoma  . Hernia repair  2003  . Tubal ligation      Bilateral  . Abdominal hysterectomy  03/27/07    laparoscopic  . Chiari decompression       Complications: No apparent anesthesia complications

## 2015-04-28 NOTE — H&P (Signed)
Primary Care Physician:  [name], MD Primary Gastroenterologist:  Dr. Vira Agar  Pre-Procedure History & Physical: HPI:  Kathleen Everett is a 44 y.o. female is here for an endoscopy and colonoscopy.  For heme positive stool and GERD   Past Medical History  Diagnosis Date  . Hyperlipidemia   . Chronic tension headaches   . Dysmenorrhea   . Hidradenitis   . Endometriosis   . Depression   . Osteoarthritis   . Obesity   . Ovarian cyst     hx  . GERD (gastroesophageal reflux disease)   . Chiari malformation     s/p sgy decompression  . Back pain     Past Surgical History  Procedure Laterality Date  . Cystoscopy      bladder ulcers  . Cesarean section      1999/2004 twins  . Temporomandibular joint surgery  2000  . Ovarian cyst removal  2001    right  . Breast biopsy      fibroadenoma  . Hernia repair  2003  . Tubal ligation      Bilateral  . Abdominal hysterectomy  03/27/07    laparoscopic  . Chiari decompression      Prior to Admission medications   Medication Sig Start Date End Date Taking? Authorizing Provider  buPROPion (WELLBUTRIN SR) 200 MG 12 hr tablet Take 200 mg by mouth 2 (two) times daily.   Yes Historical Provider, MD  lidocaine (LIDODERM) 5 % Place 1 patch onto the skin daily. Remove & Discard patch within 12 hours or as directed by MD   Yes Historical Provider, MD  ondansetron (ZOFRAN-ODT) 4 MG disintegrating tablet Take 4 mg by mouth every 8 (eight) hours as needed for nausea or vomiting.   Yes Historical Provider, MD  atorvastatin (LIPITOR) 20 MG tablet Take 1 tablet (20 mg total) by mouth daily. Patient not taking: Reported on 04/24/2015 07/21/14   [name], MD  baclofen (LIORESAL) 10 MG tablet Take 5 mg by mouth daily as needed for muscle spasms.    Historical Provider, MD  cholecalciferol (VITAMIN D) 1000 UNITS tablet Take 2,000 Units by mouth daily.    Historical Provider, MD  cyanocobalamin (,VITAMIN B-12,) 1000 MCG/ML injection  INJECT ONE ML (CC) INTRAMUSCULARLY ONCE EVERY MONTH 09/30/14   [name], MD  esomeprazole (NEXIUM) 40 MG capsule Take 1 capsule (40 mg total) by mouth 2 (two) times daily before a meal. 07/24/14   [name], MD  estradiol (ESTRACE) 1 MG tablet TAKE ONE TABLET BY MOUTH ONCE DAILY Patient not taking: Reported on 04/24/2015 01/04/15   [name], MD  FLUoxetine (PROZAC) 40 MG capsule Take 1 capsule (40 mg total) by mouth daily. 04/24/15   Marjie Skiff, MD  furosemide (LASIX) 20 MG tablet Take 1 tablet (20 mg total) by mouth daily as needed for edema. 03/15/15   [name], MD  ibuprofen (ADVIL,MOTRIN) 800 MG tablet Take 800 mg by mouth 2 (two) times daily as needed.    Historical Provider, MD  Magnesium Oxide 400 (240 MG) MG TABS One tablet daily 04/19/14   [name], MD  Nutritional Supplements (JUICE PLUS FIBRE PO) Take by mouth.    Historical Provider, MD  oxyCODONE-acetaminophen (PERCOCET) 10-325 MG per tablet Take 1 tablet by mouth every 8 (eight) hours as needed for pain.     Historical Provider, MD  potassium chloride (K-DUR) 10 MEQ tablet Take 1 tablet (10 mEq total) by mouth daily as needed (with  lasix). 03/15/15   [name], MD  potassium chloride (K-DUR,KLOR-CON) 10 MEQ tablet Take by mouth.    Historical Provider, MD  Probiotic Product (PROBIOTIC DAILY PO) Take by mouth.    Historical Provider, MD  SUMAtriptan (IMITREX) 50 MG tablet Take 50 mg by mouth as needed for migraine or headache. May repeat in 2 hours if headache persists or recurs.    Historical Provider, MD  topiramate (TOPAMAX) 50 MG tablet Take 50 mg by mouth 2 (two) times daily.    Historical Provider, MD  Vortioxetine HBr 10 MG TABS Take 10 mg by mouth every morning.    Historical Provider, MD    Allergies as of 04/11/2015 - Review Complete 03/12/2015  Allergen Reaction Noted  . Ceftin [cefuroxime axetil]  02/14/2015  . Celexa [citalopram hydrobromide] Other (See Comments) 12/08/2012  .  Penicillins Rash 12/08/2012  . Sulfa antibiotics Rash 12/08/2012  . Vancocin [vancomycin] Rash 12/08/2012    Family History  Problem Relation Age of Onset  . Hyperlipidemia Mother   . Anxiety disorder Mother   . Anxiety disorder Father   . Stroke Father   . Hyperlipidemia Father   . Hypertension Father   . Congestive Heart Failure Father   . Breast cancer Neg Hx   . Colon cancer Paternal Aunt     History   Social History  . Marital Status: Married    Spouse Name: N/A  . Number of Children: 3  . Years of Education: N/A   Occupational History  . Not on file.   Social History Main Topics  . Smoking status: Never Smoker   . Smokeless tobacco: Never Used  . Alcohol Use: No     Comment: rare  . Drug Use: No  . Sexual Activity: Yes    Birth Control/ Protection: None   Other Topics Concern  . Not on file   Social History Narrative    Review of Systems: See HPI, otherwise negative ROS  Physical Exam: BP 128/82 mmHg  Pulse 78  Temp(Src) 98.2 F (36.8 C) (Tympanic)  Resp 14  Ht 5\' 11"  (1.803 m)  Wt 108.863 kg (240 lb)  BMI 33.49 kg/m2  SpO2 100%  LMP 12/08/2006 General:   Alert,  pleasant and cooperative in NAD Head:  Normocephalic and atraumatic. Neck:  Supple; no masses or thyromegaly. Lungs:  Clear throughout to auscultation.    Heart:  Regular rate and rhythm. Abdomen:  Soft, nontender and nondistended. Normal bowel sounds, without guarding, and without rebound.   Neurologic:  Alert and  oriented x4;  grossly normal neurologically.  Impression/Plan: Kathleen Everett is here for an endoscopy and colonoscopy to be performed for heme positive stool and GERD  Risks, benefits, limitations, and alternatives regarding  endoscopy and colonoscopy have been reviewed with the patient.  Questions have been answered.  All parties agreeable.   Gaylyn Cheers, MD  04/28/2015, 11:20 AM

## 2015-04-28 NOTE — Op Note (Signed)
[facility] Gastroenterology Patient Name: Kathleen Everett Procedure Date: 04/28/2015 11:22 AM MRN: [id number] Account #: [id number] Date of Birth: Jun 23, 1971 Admit Type: Outpatient Age: 44 Room: [facility] ENDO ROOM 1 Gender: Female Note Status: Finalized Procedure:         Colonoscopy Indications:       Heme positive stool Providers:         Manya Silvas, MD Referring MD:      [name], MD (Referring MD) Medicines:         Propofol per Anesthesia Complications:     No immediate complications. Procedure:         Pre-Anesthesia Assessment:                    - After reviewing the risks and benefits, the patient was                     deemed in satisfactory condition to undergo the procedure.                    After obtaining informed consent, the colonoscope was                     passed under direct vision. Throughout the procedure, the                     patient's blood pressure, pulse, and oxygen saturations                     were monitored continuously. The Olympus PCF-160AL                     colonoscope (S#. [id number]) was introduced through the anus                     and advanced to the the cecum, identified by appendiceal                     orifice and ileocecal valve. The colonoscopy was performed                     without difficulty. The patient tolerated the procedure                     well. The quality of the bowel preparation was good. Findings:      Internal hemorrhoids were found during endoscopy. The hemorrhoids were       small and Grade I (internal hemorrhoids that do not prolapse).      The exam was otherwise without abnormality. Impression:        - Internal hemorrhoids.                    - The examination was otherwise normal.                    - No specimens collected. Recommendation:    - The findings and recommendations were discussed with the                     patient's family. Manya Silvas, MD 04/28/2015  12:03:32 PM This report has been signed electronically. Number of Addenda: 0 Note Initiated On: 04/28/2015 11:22 AM Scope Withdrawal Time: 0 hours 14 minutes 0 seconds  Total Procedure Duration: 0 hours 21 minutes 55 seconds  [facility]

## 2015-04-28 NOTE — Anesthesia Preprocedure Evaluation (Signed)
Anesthesia Evaluation    Airway Mallampati: II  TM Distance: >3 FB Neck ROM: Limited    Dental  (+) Caps Cap on L upper front tooth:   Pulmonary sleep apnea ,  Lung nodule   Pulmonary exam normal       Cardiovascular negative cardio ROS  + Valvular Problems/Murmurs MVP Rhythm:Regular Rate:Normal  Hx of chest pain, ? angina   Neuro/Psych  Headaches, PSYCHIATRIC DISORDERS Depression Arnold-chiari malformation s/p decompression.  The malformation is in the occipital region of the head.    GI/Hepatic Neg liver ROS, GERD-  Medicated and Controlled,Diverticulitis Hx   Endo/Other  negative endocrine ROS  Renal/GU negative Renal ROS  negative genitourinary   Musculoskeletal  (+) Arthritis -, Osteoarthritis,  Chronic back pain   Abdominal (+) + obese,   Peds negative pediatric ROS (+)  Hematology negative hematology ROS (+)   Anesthesia Other Findings Increased lipids... Endometriosis Hx  Reproductive/Obstetrics                             Anesthesia Physical Anesthesia Plan  ASA: III  Anesthesia Plan: General   Post-op Pain Management:    Induction: Intravenous  Airway Management Planned: Nasal Cannula  Additional Equipment:   Intra-op Plan:   Post-operative Plan:   Informed Consent: I have reviewed the patients History and Physical, chart, labs and discussed the procedure including the risks, benefits and alternatives for the proposed anesthesia with the patient or authorized representative who has indicated his/her understanding and acceptance.   Dental advisory given  Plan Discussed with: CRNA and Surgeon  Anesthesia Plan Comments:         Anesthesia Quick Evaluation

## 2015-05-01 ENCOUNTER — Other Ambulatory Visit: Payer: Self-pay

## 2015-05-01 ENCOUNTER — Ambulatory Visit (INDEPENDENT_AMBULATORY_CARE_PROVIDER_SITE_OTHER): Payer: Medicaid Other | Admitting: Licensed Clinical Social Worker

## 2015-05-01 DIAGNOSIS — F329 Major depressive disorder, single episode, unspecified: Secondary | ICD-10-CM

## 2015-05-01 DIAGNOSIS — F411 Generalized anxiety disorder: Secondary | ICD-10-CM | POA: Insufficient documentation

## 2015-05-01 DIAGNOSIS — F325 Major depressive disorder, single episode, in full remission: Secondary | ICD-10-CM | POA: Insufficient documentation

## 2015-05-01 DIAGNOSIS — F331 Major depressive disorder, recurrent, moderate: Secondary | ICD-10-CM | POA: Diagnosis not present

## 2015-05-01 DIAGNOSIS — F332 Major depressive disorder, recurrent severe without psychotic features: Secondary | ICD-10-CM | POA: Insufficient documentation

## 2015-05-01 DIAGNOSIS — F339 Major depressive disorder, recurrent, unspecified: Secondary | ICD-10-CM | POA: Insufficient documentation

## 2015-05-01 HISTORY — DX: Generalized anxiety disorder: F41.1

## 2015-05-01 HISTORY — DX: Major depressive disorder, single episode, unspecified: F32.9

## 2015-05-01 LAB — SURGICAL PATHOLOGY
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
## 2015-05-02 ENCOUNTER — Telehealth: Payer: Self-pay

## 2015-05-02 NOTE — Telephone Encounter (Signed)
Sleep Med called needing to discuss the patient's c-pap equipment and documentation  Sleep med repShauna Hugh - phone- [phone]

## 2015-05-03 ENCOUNTER — Encounter: Payer: Self-pay | Admitting: Unknown Physician Specialty

## 2015-05-03 NOTE — Telephone Encounter (Signed)
Left voicemail for [name] to return my call

## 2015-05-04 NOTE — Telephone Encounter (Signed)
Pt sleep study is over a year old & she will need a new baseline or split night study to get her authorized.  Pt was never setup for equipment.

## 2015-05-04 NOTE — Telephone Encounter (Signed)
Pt willing to proceed with split night study & states that she will call Sleep Med herself.

## 2015-05-05 NOTE — Telephone Encounter (Signed)
Form was faxed on 04/19/15 & refaxed again today after patient stated that she still hasnt heard anything about her Nexium approval.

## 2015-05-09 ENCOUNTER — Encounter: Payer: Self-pay | Admitting: *Deleted

## 2015-05-12 MED ORDER — ESOMEPRAZOLE MAGNESIUM 40 MG PO CPDR: 40.0000 mg | DELAYED_RELEASE_CAPSULE | Freq: Two times a day (BID) | ORAL | Status: DC

## 2015-05-12 NOTE — Telephone Encounter (Signed)
Fax from [facility], stating "Medicaid did not approve this request because the brand medication (Nexium) is the preferred agent, and does not require PA". New Rx sent for brand only. Sent mychart message to patient.

## 2015-05-12 NOTE — Addendum Note (Signed)
Addended by: Wynonia Lawman E on: 05/12/2015 12:44 PM   Modules accepted: Orders

## 2015-05-15 ENCOUNTER — Ambulatory Visit (INDEPENDENT_AMBULATORY_CARE_PROVIDER_SITE_OTHER): Payer: Medicaid Other | Admitting: Licensed Clinical Social Worker

## 2015-05-15 DIAGNOSIS — F331 Major depressive disorder, recurrent, moderate: Secondary | ICD-10-CM | POA: Diagnosis not present

## 2015-05-15 DIAGNOSIS — F411 Generalized anxiety disorder: Secondary | ICD-10-CM | POA: Diagnosis not present

## 2015-05-15 NOTE — Progress Notes (Signed)
THERAPIST PROGRESS NOTE  Session Time:   11:15 a.m. -12:15 p.m.  Participation Level: Active  Behavioral Response: NeatAlertAnxious, Depressed and Hopeless  Type of Therapy: Individual Therapy  Treatment Goals addressed: Anxiety and Coping  Interventions: Solution Focused, Strength-based, Supportive and Reframing  Summary: Kathleen Everett is a 44 y.o. female who returns to clinic for OPT to address ongoing symptoms of depression, anxiety, irritability and other psychosocial stressors associated with her chronic pain, limited physical mobility and adjustment to not being able to work.  Speech is a bit pressured, good eye contact is maintained, some derailment yet thoughts are logical and goal directed.  Her headaches continue to be a source of discomfort for her.  She will follow up with her Neurologist in next few weeks.  She continues wth chronic pressure in her head and neck and with recent diagnosis of mild Gastritis which results in ongoing nausea and GI upset.  New stressors around family matters including sister in law moving in next door.  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  Client has some frustration regarding this as spouse struggles to pay himself and she is worrying about how he will pay the sister in law.  She has spoken with spouse about her concerns yet there is no resolution that she is comfortable with.  Kathleen Everett recently took a trip to [place] and [place] with her mother to see family member and the long hours in the car traveling created a great deal of back pain.  She did enjoy her visit.  Symptoms reported include tearfulness, easily irritated, hopeless "I can't get anything accomplished. I can't get ahead."  In addition she expressed that  she lacks motivation and has poor concentration.  Kathleen Everett also described her thoughts as always "racing."  "My thoughts are going even when I lay down at night."  Sleep remains interrupted most nights.  Due to decrease in physical activities such as cleaning the house, cooking and basically difficulty enjoying time with her children, there is a reported sense of worthlessness.  Much limitation due to back pain.  Client shared that often she can sit down at the stove and cook and as a result this helps her to feel better about herself.  Dr. Assunta Curtis is a [facility] Neurosurgeon has done several steroid injections and this caused significant weight gain.  She gets little relief from these.  Walking aggravates her back yet she acknowledges that it is helpful for her to increase her physical activity.  Depressive symptoms are interfering with doing more activities.  Additional information shared during session is that she and spouse finished bankruptcy in 2014 and last year she admits to spending money that resulted in the couple getting three months behind in their bills. Denied that the bankruptcy had anything to do with her spending habits and was related to spouse's business debt.   Her SSDI application has been denied twice and she is in another appeals process and working with an Forensic psychologist.   Based on emotional and behavioral symptoms that also interfere with client's concentration, she has an appointment with a Dr. March Rummage for psychological testing next week.  Client remains appreciative and receptive of information shared by LCSW during session.  Goals discussed include: 1.  learning how to better cope with her moods and thoughts of helplessness and hopelessness                         2.  focus on re-discovering a purpose for her life since she has been unable to work.     Suicidal/Homicidal: Negativewithout intent/plan  Therapist Response:  Supportive psychotherapy with insight was provided along with  active and reflective listening to continue building trust and rapport with client.  LCSW offered education about common irrational fears and beliefs that contribute to anxiety. Reviewed CBT strategies to recognize and re-frame irrational fears and self-talk as a means of increasing client's capacity to handle her anxiety more constructively.  Validated feelings and thoughts expressed as part of living with chronic pain and disabling medical conditions.  Gently challenged several of client's thinking errors and assisted to shift her perception of herself and what she is capable of doing with emphasis on understanding that she may be experiencing a grief reaction as well.  Psycho-education on common underlying feelings for anger including, guilt, shame and vulnerability and offered client a hand-out on understanding and better managing angry feelings and outbursts.   Plan: Return again in two weeks.  Client is aware that she can call clinic in between appointments.  She will take medications as prescribed and will keep all medical appointments.  Client will continue reading hand-outs provided to gain knowledge and strategies to self regulate emotions and behaviors.  Diagnosis: Major Depressive Disorder, Recurrent, Moderate   Generalized Anxiety Disorder   Miguel Dibble, LCSW 05/15/2015

## 2015-05-16 DIAGNOSIS — F411 Generalized anxiety disorder: Secondary | ICD-10-CM | POA: Insufficient documentation

## 2015-05-16 DIAGNOSIS — F331 Major depressive disorder, recurrent, moderate: Secondary | ICD-10-CM | POA: Insufficient documentation

## 2015-05-19 ENCOUNTER — Encounter: Payer: Self-pay | Admitting: Psychiatry

## 2015-05-19 ENCOUNTER — Ambulatory Visit (INDEPENDENT_AMBULATORY_CARE_PROVIDER_SITE_OTHER): Payer: Medicaid Other | Admitting: Psychiatry

## 2015-05-19 VITALS — BP 112/68 | HR 77 | Temp 97.5°F | Ht 71.0 in | Wt 247.2 lb

## 2015-05-19 DIAGNOSIS — F332 Major depressive disorder, recurrent severe without psychotic features: Secondary | ICD-10-CM | POA: Diagnosis not present

## 2015-05-19 MED ORDER — ARIPIPRAZOLE 2 MG PO TABS: 2.0000 mg | ORAL_TABLET | Freq: Every day | ORAL | Status: DC
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
## 2015-05-22 ENCOUNTER — Encounter: Payer: Self-pay | Admitting: Internal Medicine

## 2015-05-22 DIAGNOSIS — K2970 Gastritis, unspecified, without bleeding: Secondary | ICD-10-CM | POA: Insufficient documentation

## 2015-05-29 ENCOUNTER — Ambulatory Visit (INDEPENDENT_AMBULATORY_CARE_PROVIDER_SITE_OTHER): Payer: Medicaid Other | Admitting: Licensed Clinical Social Worker

## 2015-05-29 DIAGNOSIS — F331 Major depressive disorder, recurrent, moderate: Secondary | ICD-10-CM

## 2015-05-29 DIAGNOSIS — F411 Generalized anxiety disorder: Secondary | ICD-10-CM
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
## 2015-06-06 ENCOUNTER — Encounter: Payer: Self-pay | Admitting: Internal Medicine

## 2015-06-08 ENCOUNTER — Ambulatory Visit: Payer: Medicaid Other | Admitting: Licensed Clinical Social Worker

## 2015-06-13 DIAGNOSIS — G43019 Migraine without aura, intractable, without status migrainosus: Secondary | ICD-10-CM | POA: Insufficient documentation

## 2015-06-15 ENCOUNTER — Emergency Department
Admission: EM | Admit: 2015-06-15 | Discharge: 2015-06-15 | Disposition: A | Discharge status: Home / Self Care | Payer: Medicaid Other | Attending: Emergency Medicine | Admitting: Emergency Medicine

## 2015-06-15 ENCOUNTER — Ambulatory Visit: Payer: Medicaid Other | Admitting: Licensed Clinical Social Worker

## 2015-06-15 ENCOUNTER — Ambulatory Visit: Payer: Medicaid Other | Admitting: Psychiatry

## 2015-06-15 ENCOUNTER — Emergency Department: Payer: Medicaid Other

## 2015-06-15 DIAGNOSIS — Y92410 Unspecified street and highway as the place of occurrence of the external cause: Secondary | ICD-10-CM | POA: Diagnosis not present

## 2015-06-15 DIAGNOSIS — S3992XA Unspecified injury of lower back, initial encounter: Secondary | ICD-10-CM | POA: Insufficient documentation

## 2015-06-15 DIAGNOSIS — Z880 Allergy status to penicillin: Secondary | ICD-10-CM | POA: Diagnosis not present

## 2015-06-15 DIAGNOSIS — Z79899 Other long term (current) drug therapy: Secondary | ICD-10-CM | POA: Insufficient documentation

## 2015-06-15 DIAGNOSIS — Y9389 Activity, other specified: Secondary | ICD-10-CM | POA: Insufficient documentation

## 2015-06-15 DIAGNOSIS — R52 Pain, unspecified: Secondary | ICD-10-CM

## 2015-06-15 DIAGNOSIS — Y998 Other external cause status: Secondary | ICD-10-CM | POA: Diagnosis not present

## 2015-06-15 DIAGNOSIS — S199XXA Unspecified injury of neck, initial encounter: Secondary | ICD-10-CM | POA: Insufficient documentation

## 2015-06-15 MED ORDER — NAPROXEN 500 MG PO TABS
500.0000 mg | ORAL_TABLET | Freq: Once | ORAL | Status: AC
  Administered 2015-06-15: 500 mg via ORAL
  Filled 2015-06-15 (×2): qty 1

## 2015-06-15 MED ORDER — MELOXICAM 7.5 MG PO TABS: 7.5000 mg | ORAL_TABLET | Freq: Every day | ORAL | Status: DC

## 2015-06-15 MED ORDER — ORPHENADRINE CITRATE 30 MG/ML IJ SOLN
60.0000 mg | Freq: Two times a day (BID) | INTRAMUSCULAR | Status: DC
  Administered 2015-06-15: 60 mg via INTRAMUSCULAR
  Filled 2015-06-15: qty 2

## 2015-06-15 MED ORDER — DIAZEPAM 2 MG PO TABS: 2.0000 mg | ORAL_TABLET | Freq: Three times a day (TID) | ORAL | Status: DC | PRN

## 2015-06-15 MED ORDER — OXYCODONE HCL 5 MG PO TABS
10.0000 mg | ORAL_TABLET | Freq: Once | ORAL | Status: AC
  Administered 2015-06-15: 10 mg via ORAL
  Filled 2015-06-15: qty 2

## 2015-06-15 NOTE — ED Notes (Signed)
AAOx3.  Skin warm and dry.  C/o pain 9/10.

## 2015-06-15 NOTE — Discharge Instructions (Signed)
Your xray shows a L3 endplate compression fracture of questionable age.  You will need to follow up with Dr. Brien Few. Call today for an appointment. Return to the emergency department for symptoms that change or worsen if you are unable to schedule an appointment.
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
## 2015-06-20 ENCOUNTER — Encounter: Payer: Self-pay | Admitting: Internal Medicine

## 2015-06-20 ENCOUNTER — Ambulatory Visit (INDEPENDENT_AMBULATORY_CARE_PROVIDER_SITE_OTHER): Payer: Medicaid Other | Admitting: Internal Medicine

## 2015-06-20 VITALS — BP 110/80 | HR 75 | Temp 98.2°F | Ht 71.0 in | Wt 250.4 lb

## 2015-06-20 DIAGNOSIS — F411 Generalized anxiety disorder: Secondary | ICD-10-CM

## 2015-06-20 DIAGNOSIS — G43809 Other migraine, not intractable, without status migrainosus: Secondary | ICD-10-CM | POA: Diagnosis not present

## 2015-06-20 DIAGNOSIS — L989 Disorder of the skin and subcutaneous tissue, unspecified: Secondary | ICD-10-CM

## 2015-06-20 DIAGNOSIS — Z0000 Encounter for general adult medical examination without abnormal findings: Secondary | ICD-10-CM

## 2015-06-20 DIAGNOSIS — E7800 Pure hypercholesterolemia, unspecified: Secondary | ICD-10-CM

## 2015-06-20 DIAGNOSIS — N809 Endometriosis, unspecified: Secondary | ICD-10-CM

## 2015-06-20 DIAGNOSIS — R911 Solitary pulmonary nodule: Secondary | ICD-10-CM

## 2015-06-20 DIAGNOSIS — G4733 Obstructive sleep apnea (adult) (pediatric): Secondary | ICD-10-CM | POA: Diagnosis not present

## 2015-06-20 DIAGNOSIS — F329 Major depressive disorder, single episode, unspecified: Secondary | ICD-10-CM

## 2015-06-20 DIAGNOSIS — K219 Gastro-esophageal reflux disease without esophagitis: Secondary | ICD-10-CM

## 2015-06-20 DIAGNOSIS — E669 Obesity, unspecified: Secondary | ICD-10-CM

## 2015-06-20 DIAGNOSIS — M545 Low back pain: Secondary | ICD-10-CM

## 2015-06-20 DIAGNOSIS — F32A Depression, unspecified: Secondary | ICD-10-CM

## 2015-06-20 MED ORDER — MUPIROCIN 2 % EX OINT: 1.0000 "application " | TOPICAL_OINTMENT | Freq: Two times a day (BID) | CUTANEOUS | Status: DC

## 2015-06-20 NOTE — Progress Notes (Signed)
Pre visit review using our clinic review tool, if applicable. No additional management support is needed unless otherwise documented below in the visit note. 
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
## 2015-06-21 ENCOUNTER — Telehealth: Payer: Self-pay

## 2015-06-21 NOTE — Telephone Encounter (Signed)
Request from [facility] , sent to [place] on  06/21/2015.

## 2015-06-22 ENCOUNTER — Encounter: Payer: Self-pay | Admitting: Internal Medicine

## 2015-06-22 ENCOUNTER — Telehealth: Payer: Self-pay | Admitting: Internal Medicine

## 2015-06-22 DIAGNOSIS — L989 Disorder of the skin and subcutaneous tissue, unspecified: Secondary | ICD-10-CM | POA: Insufficient documentation

## 2015-06-22 NOTE — Assessment & Plan Note (Signed)
Followed by neurology - Dr Manuella Ghazi.

## 2015-06-22 NOTE — Assessment & Plan Note (Signed)
On no cholesterol medication.  Low cholesterol diet and exercise.  Follow lipid panel.   

## 2015-06-22 NOTE — Assessment & Plan Note (Signed)
Seeing GI.  carafate was added.  Doing better.

## 2015-06-22 NOTE — Assessment & Plan Note (Addendum)
Found incidentally on CT abdomen and pelvis.  Dr Faith Rogue reviewed.  Recommended f/u CT in one year.  Due.  Will need to schedule.

## 2015-06-22 NOTE — Assessment & Plan Note (Signed)
Will reschedule her physical.  Colonoscopy 04/28/15 - internal hemorrhoids otherwise normal.  Mammogram 12/2014 - Birads I.

## 2015-06-22 NOTE — Assessment & Plan Note (Signed)
Noted on her lower abdomen.  bactroban as directed.  Follow.
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
## 2015-06-22 NOTE — Telephone Encounter (Signed)
Opened in error

## 2015-06-22 NOTE — Assessment & Plan Note (Signed)
CPAP.  

## 2015-06-22 NOTE — Assessment & Plan Note (Signed)
Seeing psychiatry

## 2015-06-22 NOTE — Assessment & Plan Note (Signed)
Planning to f/u with gyn this week.  Seeing Dr Kenton Kingfisher.

## 2015-06-22 NOTE — Assessment & Plan Note (Signed)
Diet and exercise.   

## 2015-06-23 ENCOUNTER — Encounter: Payer: Self-pay | Admitting: Psychiatry

## 2015-06-23 ENCOUNTER — Ambulatory Visit (INDEPENDENT_AMBULATORY_CARE_PROVIDER_SITE_OTHER): Payer: Medicaid Other | Admitting: Psychiatry

## 2015-06-23 ENCOUNTER — Ambulatory Visit (INDEPENDENT_AMBULATORY_CARE_PROVIDER_SITE_OTHER): Payer: Medicaid Other | Admitting: Licensed Clinical Social Worker

## 2015-06-23 VITALS — BP 136/84 | HR 80 | Temp 97.4°F | Ht 71.0 in | Wt 250.8 lb

## 2015-06-23 DIAGNOSIS — F411 Generalized anxiety disorder: Secondary | ICD-10-CM | POA: Diagnosis not present

## 2015-06-23 DIAGNOSIS — F99 Mental disorder, not otherwise specified: Secondary | ICD-10-CM | POA: Diagnosis not present

## 2015-06-23 DIAGNOSIS — F331 Major depressive disorder, recurrent, moderate: Secondary | ICD-10-CM

## 2015-06-23 DIAGNOSIS — R419 Unspecified symptoms and signs involving cognitive functions and awareness: Secondary | ICD-10-CM

## 2015-06-23 MED ORDER — ALPRAZOLAM 0.25 MG PO TABS: 0.2500 mg | ORAL_TABLET | Freq: Two times a day (BID) | ORAL | Status: DC | PRN

## 2015-06-23 MED ORDER — FLUOXETINE HCL 20 MG PO CAPS: 60.0000 mg | ORAL_CAPSULE | Freq: Every day | ORAL | Status: DC
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
## 2015-06-30 ENCOUNTER — Other Ambulatory Visit (INDEPENDENT_AMBULATORY_CARE_PROVIDER_SITE_OTHER): Payer: Medicaid Other

## 2015-06-30 DIAGNOSIS — E7800 Pure hypercholesterolemia, unspecified: Secondary | ICD-10-CM

## 2015-06-30 DIAGNOSIS — G4733 Obstructive sleep apnea (adult) (pediatric): Secondary | ICD-10-CM

## 2015-06-30 DIAGNOSIS — R609 Edema, unspecified: Secondary | ICD-10-CM

## 2015-06-30 LAB — BASIC METABOLIC PANEL
BUN: 11 mg/dL (ref 6–23)
CO2: 29 meq/L (ref 19–32)
Calcium: 9.1 mg/dL (ref 8.4–10.5)
Chloride: 103 mEq/L (ref 96–112)
Creatinine, Ser: 0.81 mg/dL (ref 0.40–1.20)
GFR: 82.41 mL/min (ref >60.00)
Glucose, Bld: 88 mg/dL (ref 70–99)
POTASSIUM: 4 meq/L (ref 3.5–5.1)
SODIUM: 138 meq/L (ref 135–145)

## 2015-06-30 LAB — HEPATIC FUNCTION PANEL
ALT: 24 U/L (ref 0–35)
AST: 21 U/L (ref 0–37)
Albumin: 3.9 g/dL (ref 3.5–5.2)
Alkaline Phosphatase: 84 U/L (ref 39–117)
BILIRUBIN DIRECT: 0.1 mg/dL (ref 0.0–0.3)
BILIRUBIN TOTAL: 0.3 mg/dL (ref 0.2–1.2)
Total Protein: 6.7 g/dL (ref 6.0–8.3)

## 2015-06-30 LAB — LIPID PANEL
Cholesterol: 281 mg/dL — ABNORMAL HIGH (ref 0–200)
HDL: 41.4 mg/dL (ref >39.00)
NonHDL: 239.11
Total CHOL/HDL Ratio: 7
Triglycerides: 300 mg/dL — ABNORMAL HIGH (ref 0.0–149.0)
VLDL: 60 mg/dL — AB (ref 0.0–40.0)

## 2015-06-30 LAB — LDL CHOLESTEROL, DIRECT: Direct LDL: 185 mg/dL

## 2015-07-02 ENCOUNTER — Other Ambulatory Visit: Payer: Self-pay | Admitting: Internal Medicine

## 2015-07-02 DIAGNOSIS — E7800 Pure hypercholesterolemia, unspecified: Secondary | ICD-10-CM

## 2015-07-02 NOTE — Progress Notes (Signed)
Order placed for f/u liver panel.  

## 2015-07-11 ENCOUNTER — Ambulatory Visit: Payer: Medicaid Other | Attending: Neurology

## 2015-07-11 ENCOUNTER — Encounter: Payer: Self-pay | Admitting: Internal Medicine

## 2015-07-11 ENCOUNTER — Other Ambulatory Visit: Payer: Self-pay | Admitting: *Deleted

## 2015-07-11 DIAGNOSIS — G4733 Obstructive sleep apnea (adult) (pediatric): Secondary | ICD-10-CM | POA: Diagnosis present

## 2015-07-11 MED ORDER — ROSUVASTATIN CALCIUM 5 MG PO TABS: 5.0000 mg | ORAL_TABLET | Freq: Every day | ORAL | Status: DC

## 2015-07-18 DIAGNOSIS — K2950 Unspecified chronic gastritis without bleeding: Secondary | ICD-10-CM

## 2015-07-18 DIAGNOSIS — K219 Gastro-esophageal reflux disease without esophagitis: Secondary | ICD-10-CM | POA: Insufficient documentation

## 2015-07-18 HISTORY — DX: Unspecified chronic gastritis without bleeding: K29.50

## 2015-07-27 ENCOUNTER — Other Ambulatory Visit: Payer: Self-pay | Admitting: Physical Medicine and Rehabilitation

## 2015-07-27 DIAGNOSIS — M5136 Other intervertebral disc degeneration, lumbar region: Secondary | ICD-10-CM

## 2015-07-28 ENCOUNTER — Ambulatory Visit (INDEPENDENT_AMBULATORY_CARE_PROVIDER_SITE_OTHER): Payer: Medicaid Other | Admitting: Psychiatry

## 2015-07-28 ENCOUNTER — Encounter: Payer: Self-pay | Admitting: Psychiatry

## 2015-07-28 ENCOUNTER — Ambulatory Visit (INDEPENDENT_AMBULATORY_CARE_PROVIDER_SITE_OTHER): Payer: Medicaid Other | Admitting: Licensed Clinical Social Worker

## 2015-07-28 VITALS — BP 118/68 | HR 86 | Temp 97.9°F | Ht 71.0 in | Wt 259.6 lb

## 2015-07-28 DIAGNOSIS — F331 Major depressive disorder, recurrent, moderate: Secondary | ICD-10-CM | POA: Diagnosis not present

## 2015-07-28 DIAGNOSIS — F411 Generalized anxiety disorder: Secondary | ICD-10-CM

## 2015-07-28 DIAGNOSIS — R419 Unspecified symptoms and signs involving cognitive functions and awareness: Secondary | ICD-10-CM

## 2015-07-28 DIAGNOSIS — F99 Mental disorder, not otherwise specified: Secondary | ICD-10-CM

## 2015-07-28 MED ORDER — QUETIAPINE FUMARATE 100 MG PO TABS: ORAL_TABLET | ORAL | Status: DC
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
## 2015-07-31 ENCOUNTER — Telehealth: Payer: Self-pay

## 2015-07-31 NOTE — Telephone Encounter (Signed)
received a fax stating prior authorization is required on quetiapine furmarate 100mg  .

## 2015-07-31 NOTE — Telephone Encounter (Signed)
tc  07-31-15 @  3:59 id # [id number]  approved pa # [id number] good until 07-25-16

## 2015-07-31 NOTE — Telephone Encounter (Signed)
faxed over the approved auth info- confirmed

## 2015-08-02 ENCOUNTER — Encounter: Payer: Self-pay | Admitting: Internal Medicine

## 2015-08-02 DIAGNOSIS — M545 Low back pain: Secondary | ICD-10-CM

## 2015-08-02 NOTE — Telephone Encounter (Signed)
Order placed for pain clinic referral.   

## 2015-08-08 ENCOUNTER — Ambulatory Visit: Payer: Medicaid Other | Attending: Neurology

## 2015-08-08 DIAGNOSIS — G4733 Obstructive sleep apnea (adult) (pediatric): Secondary | ICD-10-CM | POA: Diagnosis present

## 2015-08-09 ENCOUNTER — Ambulatory Visit (INDEPENDENT_AMBULATORY_CARE_PROVIDER_SITE_OTHER): Payer: Medicaid Other | Admitting: Licensed Clinical Social Worker

## 2015-08-09 DIAGNOSIS — R419 Unspecified symptoms and signs involving cognitive functions and awareness: Secondary | ICD-10-CM

## 2015-08-09 DIAGNOSIS — F411 Generalized anxiety disorder: Secondary | ICD-10-CM

## 2015-08-09 DIAGNOSIS — F99 Mental disorder, not otherwise specified: Secondary | ICD-10-CM

## 2015-08-09 DIAGNOSIS — F331 Major depressive disorder, recurrent, moderate: Secondary | ICD-10-CM
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
## 2015-08-10 ENCOUNTER — Ambulatory Visit
Admission: RE | Admit: 2015-08-10 | Discharge: 2015-08-10 | Disposition: A | Discharge status: Home / Self Care | Payer: Medicaid Other | Source: Ambulatory Visit | Attending: Physical Medicine and Rehabilitation | Admitting: Physical Medicine and Rehabilitation

## 2015-08-10 ENCOUNTER — Other Ambulatory Visit: Payer: Self-pay | Admitting: Physical Medicine and Rehabilitation

## 2015-08-10 VITALS — BP 142/91 | HR 81 | Temp 98.3°F | Resp 14

## 2015-08-10 DIAGNOSIS — M5136 Other intervertebral disc degeneration, lumbar region: Secondary | ICD-10-CM

## 2015-08-10 DIAGNOSIS — G8929 Other chronic pain: Secondary | ICD-10-CM

## 2015-08-10 DIAGNOSIS — M549 Dorsalgia, unspecified: Principal | ICD-10-CM

## 2015-08-10 MED ORDER — IOHEXOL 180 MG/ML  SOLN
6.5000 mL | Freq: Once | INTRAMUSCULAR | Status: DC | PRN
  Administered 2015-08-10: 6.5 mL

## 2015-08-10 MED ORDER — MIDAZOLAM HCL 2 MG/2ML IJ SOLN
1.0000 mg | INTRAMUSCULAR | Status: DC | PRN
  Administered 2015-08-10 (×2): 1 mg via INTRAVENOUS

## 2015-08-10 MED ORDER — SODIUM CHLORIDE 0.9 % IV SOLN
Freq: Once | INTRAVENOUS | Status: AC
  Administered 2015-08-10: 07:00:00 via INTRAVENOUS

## 2015-08-10 MED ORDER — KETOROLAC TROMETHAMINE 30 MG/ML IJ SOLN
30.0000 mg | Freq: Once | INTRAMUSCULAR | Status: AC
  Administered 2015-08-10: 30 mg via INTRAVENOUS

## 2015-08-10 MED ORDER — CIPROFLOXACIN IN D5W 400 MG/200ML IV SOLN
400.0000 mg | Freq: Once | INTRAVENOUS | Status: AC
  Administered 2015-08-10: 400 mg via INTRAVENOUS

## 2015-08-10 MED ORDER — ONDANSETRON HCL 4 MG/2ML IJ SOLN
4.0000 mg | Freq: Once | INTRAMUSCULAR | Status: AC
  Administered 2015-08-10: 4 mg via INTRAMUSCULAR

## 2015-08-10 MED ORDER — MEPERIDINE HCL 100 MG/ML IJ SOLN
100.0000 mg | Freq: Once | INTRAMUSCULAR | Status: AC
  Administered 2015-08-10: 100 mg via INTRAMUSCULAR

## 2015-08-10 MED ORDER — FENTANYL CITRATE (PF) 100 MCG/2ML IJ SOLN
25.0000 ug | INTRAMUSCULAR | Status: DC | PRN
  Administered 2015-08-10: 100 ug via INTRAVENOUS

## 2015-08-10 NOTE — Progress Notes (Signed)
34 minutes of sedation time for discogram.  Brita Romp, RN

## 2015-08-10 NOTE — Discharge Instructions (Signed)
Discogram Post Procedure Discharge Instructions ° °1. May resume a regular diet and any medications that you routinely take (including pain medications). °2. No driving day of procedure. °3. Upon discharge go home and rest for at least 4 hours.  May use an ice pack as needed to injection sites on back.  Ice to back 30 minutes on and 30 minutes off, all day. °4. May remove bandades later, today. °5. It is not unusual to be sore for several days after this procedure. ° ° ° °Please contact our office at [phone] for the following symptoms: ° °· Fever greater than 100 degrees °· Increased swelling, pain, or redness at injection site. ° ° °Thank you for visiting [facility]. ° ° °

## 2015-08-17 ENCOUNTER — Ambulatory Visit (INDEPENDENT_AMBULATORY_CARE_PROVIDER_SITE_OTHER): Payer: Medicaid Other | Admitting: Neurology

## 2015-08-17 ENCOUNTER — Encounter: Payer: Self-pay | Admitting: Neurology

## 2015-08-17 VITALS — BP 114/72 | HR 76 | Ht 71.0 in | Wt 260.0 lb

## 2015-08-17 DIAGNOSIS — R413 Other amnesia: Secondary | ICD-10-CM | POA: Diagnosis not present

## 2015-08-17 DIAGNOSIS — G43019 Migraine without aura, intractable, without status migrainosus: Secondary | ICD-10-CM

## 2015-08-17 MED ORDER — TOPIRAMATE 100 MG PO TABS: 100.0000 mg | ORAL_TABLET | Freq: Every day | ORAL | Status: DC

## 2015-08-17 NOTE — Patient Instructions (Addendum)
We will increase the Topamax to 100 mg at night and check MRI of the brain.   Topamax (topiramate) is a seizure medication that has an FDA approval for seizures and for migraine headache. Potential side effects of this medication include weight loss, cognitive slowing, tingling in the fingers and toes, and carbonated drinks will taste bad. If any significant side effects are noted on this drug, please contact our office.  Migraine Headache A migraine headache is an intense, throbbing pain on one or both sides of your head. A migraine can last for 30 minutes to several hours. CAUSES  The exact cause of a migraine headache is not always known. However, a migraine may be caused when nerves in the brain become irritated and release chemicals that cause inflammation. This causes pain. Certain things may also trigger migraines, such as:  Alcohol.  Smoking.  Stress.  Menstruation.  Aged cheeses.  Foods or drinks that contain nitrates, glutamate, aspartame, or tyramine.  Lack of sleep.  Chocolate.  Caffeine.  Hunger.  Physical exertion.  Fatigue.  Medicines used to treat chest pain (nitroglycerine), birth control pills, estrogen, and some blood pressure medicines. SIGNS AND SYMPTOMS  Pain on one or both sides of your head.  Pulsating or throbbing pain.  Severe pain that prevents daily activities.  Pain that is aggravated by any physical activity.  Nausea, vomiting, or both.  Dizziness.  Pain with exposure to bright lights, loud noises, or activity.  General sensitivity to bright lights, loud noises, or smells. Before you get a migraine, you may get warning signs that a migraine is coming (aura). An aura may include:  Seeing flashing lights.  Seeing bright spots, halos, or zigzag lines.  Having tunnel vision or blurred vision.  Having feelings of numbness or tingling.  Having trouble talking.  Having muscle weakness. DIAGNOSIS  A migraine headache is often  diagnosed based on:  Symptoms.  Physical exam.  A CT scan or MRI of your head. These imaging tests cannot diagnose migraines, but they can help rule out other causes of headaches. TREATMENT Medicines may be given for pain and nausea. Medicines can also be given to help prevent recurrent migraines.  HOME CARE INSTRUCTIONS  Only take over-the-counter or prescription medicines for pain or discomfort as directed by your health care provider. The use of long-term narcotics is not recommended.  Lie down in a dark, quiet room when you have a migraine.  Keep a journal to find out what may trigger your migraine headaches. For example, write down:  What you eat and drink.  How much sleep you get.  Any change to your diet or medicines.  Limit alcohol consumption.  Quit smoking if you smoke.  Get 7-9 hours of sleep, or as recommended by your health care provider.  Limit stress.  Keep lights dim if bright lights bother you and make your migraines worse. SEEK IMMEDIATE MEDICAL CARE IF:   Your migraine becomes severe.  You have a fever.  You have a stiff neck.  You have vision loss.  You have muscular weakness or loss of muscle control.  You start losing your balance or have trouble walking.  You feel faint or pass out.  You have severe symptoms that are different from your first symptoms. MAKE SURE YOU:   Understand these instructions.  Will watch your condition.  Will get help right away if you are not doing well or get worse. Document Released: 11/04/2005 Document Revised: 03/21/2014 Document Reviewed: 07/12/2013 Exit[employer]  Patient Information 2015 [employer]. This information is not intended to replace advice given to you by your health care provider. Make sure you discuss any questions you have with your health care provider.  
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
## 2015-08-19 LAB — HIV ANTIBODY (ROUTINE TESTING W REFLEX): HIV Screen 4th Generation wRfx: NONREACTIVE

## 2015-08-19 LAB — COPPER, SERUM: Copper: 139 ug/dL (ref 72–166)

## 2015-08-19 LAB — SEDIMENTATION RATE: Sed Rate: 29 mm/hr (ref 0–32)

## 2015-08-19 LAB — RPR: RPR Ser Ql: NONREACTIVE

## 2015-08-25 ENCOUNTER — Encounter: Payer: Self-pay | Admitting: Psychiatry

## 2015-08-25 ENCOUNTER — Ambulatory Visit (INDEPENDENT_AMBULATORY_CARE_PROVIDER_SITE_OTHER): Payer: Medicaid Other | Admitting: Psychiatry

## 2015-08-25 ENCOUNTER — Ambulatory Visit (INDEPENDENT_AMBULATORY_CARE_PROVIDER_SITE_OTHER): Payer: Medicaid Other | Admitting: Licensed Clinical Social Worker

## 2015-08-25 VITALS — BP 122/78 | HR 75 | Temp 97.6°F | Ht 71.0 in | Wt 264.6 lb

## 2015-08-25 DIAGNOSIS — F99 Mental disorder, not otherwise specified: Secondary | ICD-10-CM

## 2015-08-25 DIAGNOSIS — R419 Unspecified symptoms and signs involving cognitive functions and awareness: Secondary | ICD-10-CM

## 2015-08-25 DIAGNOSIS — F331 Major depressive disorder, recurrent, moderate: Secondary | ICD-10-CM | POA: Diagnosis not present

## 2015-08-25 DIAGNOSIS — F411 Generalized anxiety disorder: Secondary | ICD-10-CM | POA: Diagnosis not present

## 2015-08-25 DIAGNOSIS — Q0700 Arnold-Chiari syndrome without spina bifida or hydrocephalus: Secondary | ICD-10-CM | POA: Insufficient documentation

## 2015-08-25 MED ORDER — BUPROPION HCL 100 MG PO TABS: 100.0000 mg | ORAL_TABLET | Freq: Two times a day (BID) | ORAL | Status: DC
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
## 2015-08-25 NOTE — Progress Notes (Signed)
BH MD/PA/NP OP Progress Note  08/25/2015 8:58 AM Kathleen Everett  MRN:  [id number]  Subjective:  Patient returns a follow-up of her major depressive disorder, severe and generalized anxiety disorder.  She indicates for the past month her mood has been up and down. She states she has not noticed a difference with the addition of Seroquel. She stated that perhaps she might be sleeping a little bit better but she is finding that she just does not have drive, motivation and energy. She states she does have a stressor of some workup for her back and is found that she needs surgery but is waiting on some type of referral to the surgeon.  We discussed that we have already tried to augment her Prozac with Abilify and most recently Seroquel. We discussed augmenting with Wellbutrin. She indicated that she had been on it before and when she was initially seen by me in March of this year she had been on Wellbutrin SR 200 mg once daily. She indicates she felt like extended release medications do not work particularly well for her and she would prefer to try the immediate release. Chief Complaint: mood is up and down Chief Complaint    Follow-up; Medication Refill; Anxiety; Depression; Panic Attack     Visit Diagnosis:     ICD-9-CM ICD-10-CM   1. Major depressive disorder, recurrent episode, moderate (HCC) 296.32 F33.1   2. Generalized anxiety disorder 300.02 F41.1   3. Neurocognitive disorder, unspecified 294.9 F99      Past Medical History:  Past Medical History  Diagnosis Date  . Hyperlipidemia   . Chronic tension headaches   . Dysmenorrhea   . Hidradenitis   . Endometriosis   . Depression   . Osteoarthritis   . Obesity   . Ovarian cyst     hx  . GERD (gastroesophageal reflux disease)   . Chiari malformation     s/p sgy decompression  . Back pain   . Gas pain   . Vitamin B12 deficiency     On injections    Past Surgical History  Procedure Laterality Date  . Cystoscopy       bladder ulcers  . Cesarean section      1999/2004 twins  . Temporomandibular joint surgery  2000  . Ovarian cyst removal  2001    right  . Breast biopsy      fibroadenoma  . Hernia repair  2003  . Tubal ligation      Bilateral  . Abdominal hysterectomy  03/27/07    laparoscopic  . Chiari decompression    . Esophagogastroduodenoscopy N/A 04/28/2015    Procedure: ESOPHAGOGASTRODUODENOSCOPY (EGD);  Surgeon: Manya Silvas, MD;  Location[facility] ENDOSCOPY;  Service: Endoscopy;  Laterality: N/A;  . Colonoscopy with propofol N/A 04/28/2015    Procedure: COLONOSCOPY WITH PROPOFOL;  Surgeon: Manya Silvas, MD;  Location: [facility] ENDOSCOPY;  Service: Endoscopy;  Laterality: N/A;   Family History:  Family History  Problem Relation Age of Onset  . Hyperlipidemia Mother   . Anxiety disorder Mother   . Atrial fibrillation Mother   . Hypertension Mother   . Anxiety disorder Father   . Stroke Father   . Hyperlipidemia Father   . Hypertension Father   . Congestive Heart Failure Father   . Depression Father   . Breast cancer Neg Hx   . Colon cancer Paternal Aunt   . Hyperlipidemia Brother   . Dementia Maternal Grandmother   . Dementia Paternal Grandmother  Social History:  Social History   Social History  . Marital Status: Married    Spouse Name: N/A  . Number of Children: 3  . Years of Education: 64   Social History Main Topics  . Smoking status: Never Smoker   . Smokeless tobacco: Never Used  . Alcohol Use: 0.0 oz/week    0 Standard drinks or equivalent per week     Comment: rarely  . Drug Use: No  . Sexual Activity: Yes    Birth Control/ Protection: None   Other Topics Concern  . None   Social History Narrative   Patient drinks 2-3 cups of caffeine daily.   Patient is right handed.    Additional History:   Assessment:   Musculoskeletal: Strength & Muscle Tone: within normal limits Gait & Station: normal Patient leans: N/A  Psychiatric Specialty  Exam: Anxiety Symptoms include insomnia (she reports this may be a little bit better since she's been on the Seroquel). Patient reports no nervous/anxious behavior or suicidal ideas.    Depression        Associated symptoms include insomnia (she reports this may be a little bit better since she's been on the Seroquel).  Associated symptoms include no suicidal ideas.  Past medical history includes anxiety.     Review of Systems  Psychiatric/Behavioral: Positive for depression and memory loss. Negative for suicidal ideas, hallucinations and substance abuse. The patient has insomnia (she reports this may be a little bit better since she's been on the Seroquel). The patient is not nervous/anxious.   All other systems reviewed and are negative.   Blood pressure 122/78, pulse 75, temperature 97.6 F (36.4 C), temperature source Tympanic, height 5\' 11"  (1.803 m), weight 264 lb 9.6 oz (120.022 kg), last menstrual period 12/08/2006, SpO2 99 %.Body mass index is 36.92 kg/(m^2).  General Appearance: Well Groomed  Eye Contact:  Good  Speech:  Clear and Coherent  Volume:  Normal  Mood:  A little better  Affect:  Constricted  Thought Process:  Linear  Orientation:  Full (Time, Place, and Person)  Thought Content:  Negative  Suicidal Thoughts:  No  Homicidal Thoughts:  No  Memory:  Immediate;   Good Recent;   Fair Remote;   Poor  Judgement:  Good  Insight:  Good  Psychomotor Activity:  Normal  Concentration:  Good  Recall:  Poor  Fund of Knowledge: Good  Language: Good  Akathisia:  Negative  Handed:  Right unknown  AIMS (if indicated):  Done today, to be scanned to chart  Assets:  Communication Skills Desire for Improvement  ADL's:  Intact  Cognition: WNL  Sleep:  As above, fair   Is the patient at risk to self?  No. Has the patient been a risk to self in the past 6 months?  No. Has the patient been a risk to self within the distant past?  No. Is the patient a risk to others?   No. Has the patient been a risk to others in the past 6 months?  No. Has the patient been a risk to others within the distant past?  No.  Current Medications: Current Outpatient Prescriptions  Medication Sig Dispense Refill  . ALPRAZolam (XANAX) 0.25 MG tablet Take 1 tablet (0.25 mg total) by mouth 2 (two) times daily as needed for anxiety. 60 tablet 1  . cholecalciferol (VITAMIN D) 1000 UNITS tablet Take 2,000 Units by mouth daily.    . Cholecalciferol 1000 UNITS tablet Take by mouth.    Marland Kitchen  cyanocobalamin (,VITAMIN B-12,) 1000 MCG/ML injection INJECT ONE ML (CC) INTRAMUSCULARLY ONCE EVERY MONTH 1 mL 5  . diazepam (VALIUM) 2 MG tablet Take 1 tablet (2 mg total) by mouth every 8 (eight) hours as needed for muscle spasms. 15 tablet 0  . esomeprazole (NEXIUM) 40 MG capsule Take 1 capsule (40 mg total) by mouth 2 (two) times daily before a meal. 60 capsule 5  . FLUoxetine (PROZAC) 20 MG capsule Take 3 capsules (60 mg total) by mouth daily. 90 capsule 3  . furosemide (LASIX) 20 MG tablet Take 1 tablet (20 mg total) by mouth daily as needed for edema. 30 tablet 6  . Magnesium Oxide 400 (240 MG) MG TABS One tablet daily 30 tablet 1  . mupirocin ointment (BACTROBAN) 2 % Place 1 application into the nose 2 (two) times daily. 22 g 0  . Nutritional Supplements (JUICE PLUS FIBRE PO) Take by mouth.    . potassium chloride (K-DUR) 10 MEQ tablet Take 1 tablet (10 mEq total) by mouth daily as needed (with lasix). 30 tablet 6  . QUEtiapine (SEROQUEL) 100 MG tablet Take one half a tablet at bedtime for one week, then increase to one whole tablet at bedtime. 30 tablet 1  . rosuvastatin (CRESTOR) 5 MG tablet Take 1 tablet (5 mg total) by mouth daily. 30 tablet 3  . SUMAtriptan (IMITREX) 50 MG tablet Take 50 mg by mouth as needed for migraine or headache. May repeat in 2 hours if headache persists or recurs.    . topiramate (TOPAMAX) 100 MG tablet Take 1 tablet (100 mg total) by mouth at bedtime. 30 tablet 3  .  UNABLE TO FIND 1 application as needed. Med Name: Lidocaine cream    . buPROPion (WELLBUTRIN) 100 MG tablet Take 1 tablet (100 mg total) by mouth 2 (two) times daily. 60 tablet 1  . oxyCODONE-acetaminophen (PERCOCET) 10-325 MG per tablet Take 1 tablet by mouth every 6 (six) hours as needed for pain.      No current facility-administered medications for this visit.    Medical Decision Making:  Established Problem, Stable/Improving (1)  Treatment Plan Summary:Medication management   Major depressive disorder, recurrent moderate we will continue Prozac to 60 mg daily and seroquel 100 mg at bedtime. We will start some Wellbutrin 100 mg twice daily. Risk and benefits of been discussed and patient is able to consent.  Generalized anxieties order-Prozac as above. Patient also has when necessary alprazolam which she states she is rarely been using  Continue therapy.  Patient will follow up in 1 month. She's been encouraged call any questions or concerns prior to her next appointment.    Faith Rogue 08/25/2015, 8:58 AM

## 2015-08-31 ENCOUNTER — Telehealth: Payer: Self-pay | Admitting: *Deleted

## 2015-08-31 NOTE — Telephone Encounter (Signed)
Form completed and placed in your box.  

## 2015-08-31 NOTE — Telephone Encounter (Signed)
I received a second fax from [facility] today. Asking to complete questions 19-27 & fax notes from last 6 months pertaining to sleep related issues. I have placed this in your folder yesterday.

## 2015-09-01 ENCOUNTER — Telehealth: Payer: Self-pay | Admitting: Internal Medicine

## 2015-09-01 NOTE — Telephone Encounter (Signed)
Form & notes faxed to [facility]

## 2015-09-01 NOTE — Telephone Encounter (Signed)
[name] called from [facility] wanted to know if the progress note request and a medicaid pa form was received? It was faxed on 10/12. [name] number is [phone]. Thank You!

## 2015-09-01 NOTE — Telephone Encounter (Signed)
The paperwork has been faxed this morning for a second time.

## 2015-09-06 ENCOUNTER — Ambulatory Visit (INDEPENDENT_AMBULATORY_CARE_PROVIDER_SITE_OTHER): Payer: Medicaid Other | Admitting: Licensed Clinical Social Worker

## 2015-09-06 DIAGNOSIS — F99 Mental disorder, not otherwise specified: Secondary | ICD-10-CM

## 2015-09-06 DIAGNOSIS — F411 Generalized anxiety disorder: Secondary | ICD-10-CM

## 2015-09-06 DIAGNOSIS — R419 Unspecified symptoms and signs involving cognitive functions and awareness: Secondary | ICD-10-CM

## 2015-09-06 DIAGNOSIS — F332 Major depressive disorder, recurrent severe without psychotic features: Secondary | ICD-10-CM | POA: Diagnosis not present
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
## 2015-09-09 ENCOUNTER — Other Ambulatory Visit: Payer: Self-pay | Admitting: Internal Medicine

## 2015-09-11 ENCOUNTER — Telehealth: Payer: Self-pay

## 2015-09-11 NOTE — Telephone Encounter (Signed)
Called to complete PA for Rosuvastatin for patient.  Interaction id # [id number].  Confirmation number [id number] P.

## 2015-09-20 ENCOUNTER — Ambulatory Visit (INDEPENDENT_AMBULATORY_CARE_PROVIDER_SITE_OTHER): Payer: Medicaid Other | Admitting: Internal Medicine

## 2015-09-20 ENCOUNTER — Encounter: Payer: Self-pay | Admitting: Internal Medicine

## 2015-09-20 VITALS — BP 110/70 | HR 80 | Temp 98.4°F | Resp 18 | Ht 71.0 in | Wt 263.8 lb

## 2015-09-20 DIAGNOSIS — G43809 Other migraine, not intractable, without status migrainosus: Secondary | ICD-10-CM | POA: Diagnosis not present

## 2015-09-20 DIAGNOSIS — F332 Major depressive disorder, recurrent severe without psychotic features: Secondary | ICD-10-CM

## 2015-09-20 DIAGNOSIS — M545 Low back pain: Secondary | ICD-10-CM

## 2015-09-20 DIAGNOSIS — G4733 Obstructive sleep apnea (adult) (pediatric): Secondary | ICD-10-CM | POA: Diagnosis not present

## 2015-09-20 DIAGNOSIS — L989 Disorder of the skin and subcutaneous tissue, unspecified: Secondary | ICD-10-CM

## 2015-09-20 DIAGNOSIS — K219 Gastro-esophageal reflux disease without esophagitis: Secondary | ICD-10-CM | POA: Diagnosis not present

## 2015-09-20 DIAGNOSIS — E7800 Pure hypercholesterolemia, unspecified: Secondary | ICD-10-CM

## 2015-09-20 DIAGNOSIS — Q0700 Arnold-Chiari syndrome without spina bifida or hydrocephalus: Secondary | ICD-10-CM

## 2015-09-20 DIAGNOSIS — R911 Solitary pulmonary nodule: Secondary | ICD-10-CM

## 2015-09-20 DIAGNOSIS — E669 Obesity, unspecified: Secondary | ICD-10-CM

## 2015-09-20 DIAGNOSIS — F411 Generalized anxiety disorder: Secondary | ICD-10-CM

## 2015-09-20 MED ORDER — ATORVASTATIN CALCIUM 10 MG PO TABS: 10.0000 mg | ORAL_TABLET | Freq: Every day | ORAL | Status: DC

## 2015-09-20 NOTE — Progress Notes (Signed)
Pre-visit discussion using our clinic review tool. No additional management support is needed unless otherwise documented below in the visit note.  

## 2015-09-20 NOTE — Progress Notes (Signed)
Patient ID: Kathleen Everett, female   DOB: Mar 02, 1971, 44 y.o.   MRN: [id number]   Subjective:    Patient ID: Kathleen Everett, female    DOB: June 07, 1971, 44 y.o.   MRN: [id number]  HPI  Patient with past history of hypercholesterolemia, depression, OA, chronic back pain and GERD.  She comes in today for a scheduled physical exam, but she states she has had a physical.  Here to f/u on the above issues.  She saw gyn recently.  Was given clonidine for hot flashes.  Also instructed to use vaginal estrogen.  She had a discogram recently - Dr Brien Few.  Was referred to see a neurosurgeon.  Saw Dr Sherwood Gambler.  Not felt to be a surgical candidate.  She is still having increased pain.  Had questions about her options.  Discussed referral back to Dr Brien Few for reevaluation, since not a surgical candidate.  She is unable to afford crestor.  States no coverage for the crestor.  Is willing to try lipitor.  Discussed low cholesterol diet and exercise.  She has follow up with neurology.  Planning a f/u MRI given persistent headaches and and the feeling of being off balance.  She is still seeing psychiatry.  Medications being adjusted.  No chest pain or tightness.  No sob.  Breathing stable.  No abdominal pain or cramping.  Bowels stable.  Persistent lesion - right lower abdomen.     Past Medical History  Diagnosis Date  . Hyperlipidemia   . Chronic tension headaches   . Dysmenorrhea   . Hidradenitis   . Endometriosis   . Depression   . Osteoarthritis   . Obesity   . Ovarian cyst     hx  . GERD (gastroesophageal reflux disease)   . Chiari malformation     s/p sgy decompression  . Back pain   . Gas pain   . Vitamin B12 deficiency     On injections   Past Surgical History  Procedure Laterality Date  . Cystoscopy      bladder ulcers  . Cesarean section      1999/2004 twins  . Temporomandibular joint surgery  2000  . Ovarian cyst removal  2001    right  . Breast biopsy      fibroadenoma  .  Hernia repair  2003  . Tubal ligation      Bilateral  . Abdominal hysterectomy  03/27/07    laparoscopic  . Chiari decompression    . Esophagogastroduodenoscopy N/A 04/28/2015    Procedure: ESOPHAGOGASTRODUODENOSCOPY (EGD);  Surgeon: Manya Silvas, MD;  Location: [facility] ENDOSCOPY;  Service: Endoscopy;  Laterality: N/A;  . Colonoscopy with propofol N/A 04/28/2015    Procedure: COLONOSCOPY WITH PROPOFOL;  Surgeon: Manya Silvas, MD;  Location: [facility] ENDOSCOPY;  Service: Endoscopy;  Laterality: N/A;   Family History  Problem Relation Age of Onset  . Hyperlipidemia Mother   . Anxiety disorder Mother   . Atrial fibrillation Mother   . Hypertension Mother   . Anxiety disorder Father   . Stroke Father   . Hyperlipidemia Father   . Hypertension Father   . Congestive Heart Failure Father   . Depression Father   . Breast cancer Neg Hx   . Colon cancer Paternal Aunt   . Hyperlipidemia Brother   . Dementia Maternal Grandmother   . Dementia Paternal Grandmother    Social History   Social History  . Marital Status: Married    Spouse Name: N/A  .  Number of Children: 3  . Years of Education: 31   Social History Main Topics  . Smoking status: Never Smoker   . Smokeless tobacco: Never Used  . Alcohol Use: 0.0 oz/week    0 Standard drinks or equivalent per week     Comment: rarely  . Drug Use: No  . Sexual Activity: Yes    Birth Control/ Protection: None   Other Topics Concern  . None   Social History Narrative   Patient drinks 2-3 cups of caffeine daily.   Patient is right handed.     Outpatient Encounter Prescriptions as of 09/20/2015  Medication Sig  . ALPRAZolam (XANAX) 0.25 MG tablet Take 1 tablet (0.25 mg total) by mouth 2 (two) times daily as needed for anxiety.  Marland Kitchen buPROPion (WELLBUTRIN) 100 MG tablet Take 1 tablet (100 mg total) by mouth 2 (two) times daily.  . cholecalciferol (VITAMIN D) 1000 UNITS tablet Take 2,000 Units by mouth daily.  . cyanocobalamin (,VITAMIN  B-12,) 1000 MCG/ML injection INJECT ONE ML (CC) INTRAMUSCULARLY ONCE EVERY MONTH  . diazepam (VALIUM) 5 MG tablet Take 5 mg by mouth every 6 (six) hours as needed for anxiety.  Marland Kitchen esomeprazole (NEXIUM) 40 MG capsule Take 1 capsule (40 mg total) by mouth 2 (two) times daily before a meal.  . FLUoxetine (PROZAC) 20 MG capsule Take 3 capsules (60 mg total) by mouth daily.  . furosemide (LASIX) 20 MG tablet Take 1 tablet (20 mg total) by mouth daily as needed for edema.  . Magnesium Oxide 400 (240 MG) MG TABS One tablet daily  . mupirocin ointment (BACTROBAN) 2 % Place 1 application into the nose 2 (two) times daily.  . Nutritional Supplements (JUICE PLUS FIBRE PO) Take by mouth.  . oxyCODONE-acetaminophen (PERCOCET) 10-325 MG per tablet Take 1 tablet by mouth every 6 (six) hours as needed for pain.   . potassium chloride (K-DUR) 10 MEQ tablet Take 1 tablet (10 mEq total) by mouth daily as needed (with lasix).  . QUEtiapine (SEROQUEL) 100 MG tablet Take one half a tablet at bedtime for one week, then increase to one whole tablet at bedtime.  . SUMAtriptan (IMITREX) 50 MG tablet Take 50 mg by mouth as needed for migraine or headache. May repeat in 2 hours if headache persists or recurs.  . topiramate (TOPAMAX) 100 MG tablet Take 1 tablet (100 mg total) by mouth at bedtime.  Marland Kitchen UNABLE TO FIND 1 application as needed. Med Name: Lidocaine cream  . [DISCONTINUED] Cholecalciferol 1000 UNITS tablet Take by mouth.  . [DISCONTINUED] diazepam (VALIUM) 2 MG tablet Take 1 tablet (2 mg total) by mouth every 8 (eight) hours as needed for muscle spasms. (Patient taking differently: Take 5 mg by mouth every 8 (eight) hours as needed for muscle spasms. )  . [DISCONTINUED] rosuvastatin (CRESTOR) 5 MG tablet Take 1 tablet (5 mg total) by mouth daily.  Marland Kitchen atorvastatin (LIPITOR) 10 MG tablet Take 1 tablet (10 mg total) by mouth daily.   No facility-administered encounter medications on file as of 09/20/2015.    Review of  Systems  Constitutional: Negative for appetite change and unexpected weight change.  HENT: Negative for congestion and sinus pressure.   Eyes: Negative for pain and visual disturbance.  Respiratory: Negative for cough, chest tightness and shortness of breath.   Cardiovascular: Negative for chest pain, palpitations and leg swelling.  Gastrointestinal: Negative for nausea, vomiting, abdominal pain and diarrhea.  Genitourinary: Negative for dysuria and difficulty urinating.  Musculoskeletal: Positive  for back pain (chronic back pain.  persistent as outlined.  ). Negative for joint swelling.  Skin: Negative for color change and rash.  Neurological: Negative for headaches.       Unsteady gait.   Hematological: Negative for adenopathy. Does not bruise/bleed easily.  Psychiatric/Behavioral:       Seeing psychiatry.  Adjusting medications.  See their note for details.         Objective:    Physical Exam  Constitutional: She appears well-developed and well-nourished. No distress.  HENT:  Nose: Nose normal.  Mouth/Throat: Oropharynx is clear and moist.  Eyes: Conjunctivae are normal. Right eye exhibits no discharge. Left eye exhibits no discharge.  Neck: Neck supple. No thyromegaly present.  Cardiovascular: Normal rate and regular rhythm.   Pulmonary/Chest: Breath sounds normal. No respiratory distress. She has no wheezes.  Abdominal: Soft. Bowel sounds are normal. There is no tenderness.  Musculoskeletal: She exhibits no edema or tenderness.  Lymphadenopathy:    She has no cervical adenopathy.  Skin: No rash noted. No erythema.  Psychiatric: She has a normal mood and affect. Her behavior is normal.    BP 110/70 mmHg  Pulse 80  Temp(Src) 98.4 F (36.9 C) (Oral)  Resp 18  Ht 5\' 11"  (1.803 m)  Wt 263 lb 12 oz (119.636 kg)  BMI 36.80 kg/m2  SpO2 98%  LMP 12/08/2006 Wt Readings from Last 3 Encounters:  09/20/15 263 lb 12 oz (119.636 kg)  08/25/15 264 lb 9.6 oz (120.022 kg)    08/17/15 260 lb (117.935 kg)     Lab Results  Component Value Date   WBC 5.6 10/17/2014   HGB 12.3 10/17/2014   HCT 37.4 10/17/2014   PLT 219.0 10/17/2014   GLUCOSE 88 06/30/2015   CHOL 281* 06/30/2015   TRIG 300.0* 06/30/2015   HDL 41.40 06/30/2015   LDLDIRECT 185.0 06/30/2015   LDLCALC 110* 02/21/2015   ALT 24 06/30/2015   AST 21 06/30/2015   NA 138 06/30/2015   K 4.0 06/30/2015   CL 103 06/30/2015   CREATININE 0.81 06/30/2015   BUN 11 06/30/2015   CO2 29 06/30/2015   TSH 2.05 02/21/2015    Ct Lumbar Spine W Contrast  08/10/2015  CLINICAL DATA:  Lumbar degenerative disc disease. Axial lumbago/low back pain. RIGHT hip pain. EXAM: LUMBAR DISCOGRAM L2-L3, L3-L4, L4-L5, L5-S1 FLUOROSCOPY TIME:  4 minutes 59 seconds Dose area product: 830.45 microGy*m^2 PROCEDURE: After a thorough discussion of risks and benefits of the procedure, written and oral informed consent was obtained. Specific risks included discitis, infection of soft tissue and/or bone, and nontarget injection. Accelerated progression of degenerative disk disease was also discussed. General risks of the procedure including bleeding, infection, and injury to nerves, blood vessels, and adjacent structures. Verbal consent was obtained by Dr. Gerilyn Pilgrim prior to the procedure. Discography was performed via a RIGHT posterior oblique approach. Prior to prep and draped in the usual sterile fashion, the entry sites were marked using fluoroscopy. Subsequently, using stringent sterile technique, the back was prepped and draped. 400 mg IV Cipro as an IV antibiotic was completed 30 minutes prior to needle stick. 1 mL Antibiotic was also mixed with Omnipaque-180 used for disc injection. All elements of maximum barrier sterile technique were employed (cap, mask, sterile gown and gloves, large sterile sheet, hand hygiene, betadine scrub or alternative skin antisepsis). After local anesthesia was provided with 1% lidocaine without epinephrine to  the skin and deeper tissues spinal needles were inserted. Under meticulous sterile technique,  15, 15, 20 cm, and 20 cm 22-gauge needles were advanced fluoroscopically into the disc spaces of L2-L3, L3-L4, L5-S1 and L4-L5 using intermittent fluoroscopy. One satisfactory needle position was achieved, contrast was injected. L2-L3: Total volume injected: 1.5 mL. Opening pressure was 5 PSI. Pressure endpoint 15 (disc would not pressurize) PSI. Pain reported 5 out of 10 (1 - 10 scale). Sensation described as non concordant. Pressure in the low back. Pattern of opacification showed circumferential annular fissuring and diffuse disc degeneration. No convincing epidural spread of contrast. Anterior spread of contrast extended to the anterior annulus. Representative images obtained in AP and lateral projections. L3-L4: Total volume injected: 1.5 milliliters. Opening pressure was 20 PSI. Pressure endpoint 50 PSI. Pain reported 9 out of 10 (1 - 10 scale). Sensation described as concordant pain in the low back. Axial lumbago/low back pain. Pattern of opacification showed diffuse annular tearing without epidural spread of contrast. Representative images obtained in AP and lateral projections. L4-L5: Total volume injected: 2.0 milliliters. Opening pressure was 5 PSI. Pressure endpoint 20 PSI (disc would not pressurize). Pain reported 10 out of 10 (1 - 10 scale). Sensation described as concordant pain. Pattern of opacification showed diffuse annular tearing without epidural spread of contrast. Representative images obtained in AP and lateral projections. L5-S1: Total volume injected: 1.5 milliliters. Opening pressure was 10 PSI. Pressure endpoint 50 PSI. Pain reported 9 out of 10 (1 - 10 scale). Sensation described as pain radiating to the RIGHT hip which occurs intermittently with the lumbago/low back pain on a daily basis. The RIGHT hip pain match to every day symptoms. There is also radiation of pain to the RIGHT groin which is  not a typical feature of back pain. Pattern of opacification showed . Representative images obtained in AP and lateral projections. Sedation was administered with 2 mg of intravenous Versed, 100 mcg Fentanyl IV. 30 milligrams of IV Toradol. CT POST-DISCOGRAM TECHNIQUE: Contiguous axial images were obtained from the inferior aspect of L2 through S1. Coronal and sagittal reconstructions of the disc spaces were created. FINDINGS: Aortoiliac atherosclerosis. Small calcification is present along the anterior wall of the inferior vena cava. Mild sacroiliac joint degenerative disease. L2-L3: Intra nuclear opacification with diffuse anterior annular tearing. Annular tearing is grade 5 with anterior spread of contrast along the superficial aspect of the anterior annulus. There is no stenosis. Shallow broad-based disc bulging is present. L3-L4: Diffuse annular tearing. Moderate LEFT facet arthrosis. The central canal appears adequately patent. Grade IV annular tearing is present anteriorly. Mild symmetric foraminal stenosis secondary to short pedicles and bulging disc. L4-L5: Diffuse circumferential annular tearing. Epidural spread of contrast on the LEFT compatible with a grade 5 annular tear in the LEFT subarticular zone. Broad-based posterior disc bulging. Central canal appears adequately patent. Neural foramina appear patent. L5-S1: Preserved disc height and normal nuclear opacification. No stenosis. Facet joints appear normal. IMPRESSION: LUMBAR DISCOGRAM IMPRESSION: Technically successful 4 level lumbar discogram from L2-L3 through L5-S1. Concordant back pain at L3-L4, L4-L5 and L5-S1 although the L5-S1 was not an exact match. The patient felt that this level did reproduce RIGHT hip pain which she experiences on an every day basis. Normal L2-L3 control level. CT POST-DISCOGRAM IMPRESSION: Diffuse annular tearing from L2-L3 through L4-L5. Normal appearance of the disc at L5-S1. Electronically Signed   By: Dereck Ligas  M.D.   On: 08/10/2015 09:55   Dg Diskogram Lumbar  08/10/2015  CLINICAL DATA:  Lumbar degenerative disc disease. Axial lumbago/low back pain. RIGHT hip pain. EXAM:  LUMBAR DISCOGRAM L2-L3, L3-L4, L4-L5, L5-S1 FLUOROSCOPY TIME:  4 minutes 59 seconds Dose area product: 830.45 microGy*m^2 PROCEDURE: After a thorough discussion of risks and benefits of the procedure, written and oral informed consent was obtained. Specific risks included discitis, infection of soft tissue and/or bone, and nontarget injection. Accelerated progression of degenerative disk disease was also discussed. General risks of the procedure including bleeding, infection, and injury to nerves, blood vessels, and adjacent structures. Verbal consent was obtained by Dr. Gerilyn Pilgrim prior to the procedure. Discography was performed via a RIGHT posterior oblique approach. Prior to prep and draped in the usual sterile fashion, the entry sites were marked using fluoroscopy. Subsequently, using stringent sterile technique, the back was prepped and draped. 400 mg IV Cipro as an IV antibiotic was completed 30 minutes prior to needle stick. 1 mL Antibiotic was also mixed with Omnipaque-180 used for disc injection. All elements of maximum barrier sterile technique were employed (cap, mask, sterile gown and gloves, large sterile sheet, hand hygiene, betadine scrub or alternative skin antisepsis). After local anesthesia was provided with 1% lidocaine without epinephrine to the skin and deeper tissues spinal needles were inserted. Under meticulous sterile technique, 15, 15, 20 cm, and 20 cm 22-gauge needles were advanced fluoroscopically into the disc spaces of L2-L3, L3-L4, L5-S1 and L4-L5 using intermittent fluoroscopy. One satisfactory needle position was achieved, contrast was injected. L2-L3: Total volume injected: 1.5 mL. Opening pressure was 5 PSI. Pressure endpoint 15 (disc would not pressurize) PSI. Pain reported 5 out of 10 (1 - 10 scale). Sensation described  as non concordant. Pressure in the low back. Pattern of opacification showed circumferential annular fissuring and diffuse disc degeneration. No convincing epidural spread of contrast. Anterior spread of contrast extended to the anterior annulus. Representative images obtained in AP and lateral projections. L3-L4: Total volume injected: 1.5 milliliters. Opening pressure was 20 PSI. Pressure endpoint 50 PSI. Pain reported 9 out of 10 (1 - 10 scale). Sensation described as concordant pain in the low back. Axial lumbago/low back pain. Pattern of opacification showed diffuse annular tearing without epidural spread of contrast. Representative images obtained in AP and lateral projections. L4-L5: Total volume injected: 2.0 milliliters. Opening pressure was 5 PSI. Pressure endpoint 20 PSI (disc would not pressurize). Pain reported 10 out of 10 (1 - 10 scale). Sensation described as concordant pain. Pattern of opacification showed diffuse annular tearing without epidural spread of contrast. Representative images obtained in AP and lateral projections. L5-S1: Total volume injected: 1.5 milliliters. Opening pressure was 10 PSI. Pressure endpoint 50 PSI. Pain reported 9 out of 10 (1 - 10 scale). Sensation described as pain radiating to the RIGHT hip which occurs intermittently with the lumbago/low back pain on a daily basis. The RIGHT hip pain match to every day symptoms. There is also radiation of pain to the RIGHT groin which is not a typical feature of back pain. Pattern of opacification showed . Representative images obtained in AP and lateral projections. Sedation was administered with 2 mg of intravenous Versed, 100 mcg Fentanyl IV. 30 milligrams of IV Toradol. CT POST-DISCOGRAM TECHNIQUE: Contiguous axial images were obtained from the inferior aspect of L2 through S1. Coronal and sagittal reconstructions of the disc spaces were created. FINDINGS: Aortoiliac atherosclerosis. Small calcification is present along the  anterior wall of the inferior vena cava. Mild sacroiliac joint degenerative disease. L2-L3: Intra nuclear opacification with diffuse anterior annular tearing. Annular tearing is grade 5 with anterior spread of contrast along the superficial aspect of the  anterior annulus. There is no stenosis. Shallow broad-based disc bulging is present. L3-L4: Diffuse annular tearing. Moderate LEFT facet arthrosis. The central canal appears adequately patent. Grade IV annular tearing is present anteriorly. Mild symmetric foraminal stenosis secondary to short pedicles and bulging disc. L4-L5: Diffuse circumferential annular tearing. Epidural spread of contrast on the LEFT compatible with a grade 5 annular tear in the LEFT subarticular zone. Broad-based posterior disc bulging. Central canal appears adequately patent. Neural foramina appear patent. L5-S1: Preserved disc height and normal nuclear opacification. No stenosis. Facet joints appear normal. IMPRESSION: LUMBAR DISCOGRAM IMPRESSION: Technically successful 4 level lumbar discogram from L2-L3 through L5-S1. Concordant back pain at L3-L4, L4-L5 and L5-S1 although the L5-S1 was not an exact match. The patient felt that this level did reproduce RIGHT hip pain which she experiences on an every day basis. Normal L2-L3 control level. CT POST-DISCOGRAM IMPRESSION: Diffuse annular tearing from L2-L3 through L4-L5. Normal appearance of the disc at L5-S1. Electronically Signed   By: Dereck Ligas M.D.   On: 08/10/2015 09:55   Dg Diskogram Lumbar  08/10/2015  CLINICAL DATA:  Lumbar degenerative disc disease. Axial lumbago/low back pain. RIGHT hip pain. EXAM: LUMBAR DISCOGRAM L2-L3, L3-L4, L4-L5, L5-S1 FLUOROSCOPY TIME:  4 minutes 59 seconds Dose area product: 830.45 microGy*m^2 PROCEDURE: After a thorough discussion of risks and benefits of the procedure, written and oral informed consent was obtained. Specific risks included discitis, infection of soft tissue and/or bone, and  nontarget injection. Accelerated progression of degenerative disk disease was also discussed. General risks of the procedure including bleeding, infection, and injury to nerves, blood vessels, and adjacent structures. Verbal consent was obtained by Dr. Gerilyn Pilgrim prior to the procedure. Discography was performed via a RIGHT posterior oblique approach. Prior to prep and draped in the usual sterile fashion, the entry sites were marked using fluoroscopy. Subsequently, using stringent sterile technique, the back was prepped and draped. 400 mg IV Cipro as an IV antibiotic was completed 30 minutes prior to needle stick. 1 mL Antibiotic was also mixed with Omnipaque-180 used for disc injection. All elements of maximum barrier sterile technique were employed (cap, mask, sterile gown and gloves, large sterile sheet, hand hygiene, betadine scrub or alternative skin antisepsis). After local anesthesia was provided with 1% lidocaine without epinephrine to the skin and deeper tissues spinal needles were inserted. Under meticulous sterile technique, 15, 15, 20 cm, and 20 cm 22-gauge needles were advanced fluoroscopically into the disc spaces of L2-L3, L3-L4, L5-S1 and L4-L5 using intermittent fluoroscopy. One satisfactory needle position was achieved, contrast was injected. L2-L3: Total volume injected: 1.5 mL. Opening pressure was 5 PSI. Pressure endpoint 15 (disc would not pressurize) PSI. Pain reported 5 out of 10 (1 - 10 scale). Sensation described as non concordant. Pressure in the low back. Pattern of opacification showed circumferential annular fissuring and diffuse disc degeneration. No convincing epidural spread of contrast. Anterior spread of contrast extended to the anterior annulus. Representative images obtained in AP and lateral projections. L3-L4: Total volume injected: 1.5 milliliters. Opening pressure was 20 PSI. Pressure endpoint 50 PSI. Pain reported 9 out of 10 (1 - 10 scale). Sensation described as concordant pain  in the low back. Axial lumbago/low back pain. Pattern of opacification showed diffuse annular tearing without epidural spread of contrast. Representative images obtained in AP and lateral projections. L4-L5: Total volume injected: 2.0 milliliters. Opening pressure was 5 PSI. Pressure endpoint 20 PSI (disc would not pressurize). Pain reported 10 out of 10 (1 - 10 scale).  Sensation described as concordant pain. Pattern of opacification showed diffuse annular tearing without epidural spread of contrast. Representative images obtained in AP and lateral projections. L5-S1: Total volume injected: 1.5 milliliters. Opening pressure was 10 PSI. Pressure endpoint 50 PSI. Pain reported 9 out of 10 (1 - 10 scale). Sensation described as pain radiating to the RIGHT hip which occurs intermittently with the lumbago/low back pain on a daily basis. The RIGHT hip pain match to every day symptoms. There is also radiation of pain to the RIGHT groin which is not a typical feature of back pain. Pattern of opacification showed . Representative images obtained in AP and lateral projections. Sedation was administered with 2 mg of intravenous Versed, 100 mcg Fentanyl IV. 30 milligrams of IV Toradol. CT POST-DISCOGRAM TECHNIQUE: Contiguous axial images were obtained from the inferior aspect of L2 through S1. Coronal and sagittal reconstructions of the disc spaces were created. FINDINGS: Aortoiliac atherosclerosis. Small calcification is present along the anterior wall of the inferior vena cava. Mild sacroiliac joint degenerative disease. L2-L3: Intra nuclear opacification with diffuse anterior annular tearing. Annular tearing is grade 5 with anterior spread of contrast along the superficial aspect of the anterior annulus. There is no stenosis. Shallow broad-based disc bulging is present. L3-L4: Diffuse annular tearing. Moderate LEFT facet arthrosis. The central canal appears adequately patent. Grade IV annular tearing is present anteriorly.  Mild symmetric foraminal stenosis secondary to short pedicles and bulging disc. L4-L5: Diffuse circumferential annular tearing. Epidural spread of contrast on the LEFT compatible with a grade 5 annular tear in the LEFT subarticular zone. Broad-based posterior disc bulging. Central canal appears adequately patent. Neural foramina appear patent. L5-S1: Preserved disc height and normal nuclear opacification. No stenosis. Facet joints appear normal. IMPRESSION: LUMBAR DISCOGRAM IMPRESSION: Technically successful 4 level lumbar discogram from L2-L3 through L5-S1. Concordant back pain at L3-L4, L4-L5 and L5-S1 although the L5-S1 was not an exact match. The patient felt that this level did reproduce RIGHT hip pain which she experiences on an every day basis. Normal L2-L3 control level. CT POST-DISCOGRAM IMPRESSION: Diffuse annular tearing from L2-L3 through L4-L5. Normal appearance of the disc at L5-S1. Electronically Signed   By: Dereck Ligas M.D.   On: 08/10/2015 09:55   Dg Diskogram Lumbar  08/10/2015  CLINICAL DATA:  Lumbar degenerative disc disease. Axial lumbago/low back pain. RIGHT hip pain. EXAM: LUMBAR DISCOGRAM L2-L3, L3-L4, L4-L5, L5-S1 FLUOROSCOPY TIME:  4 minutes 59 seconds Dose area product: 830.45 microGy*m^2 PROCEDURE: After a thorough discussion of risks and benefits of the procedure, written and oral informed consent was obtained. Specific risks included discitis, infection of soft tissue and/or bone, and nontarget injection. Accelerated progression of degenerative disk disease was also discussed. General risks of the procedure including bleeding, infection, and injury to nerves, blood vessels, and adjacent structures. Verbal consent was obtained by Dr. Gerilyn Pilgrim prior to the procedure. Discography was performed via a RIGHT posterior oblique approach. Prior to prep and draped in the usual sterile fashion, the entry sites were marked using fluoroscopy. Subsequently, using stringent sterile technique, the  back was prepped and draped. 400 mg IV Cipro as an IV antibiotic was completed 30 minutes prior to needle stick. 1 mL Antibiotic was also mixed with Omnipaque-180 used for disc injection. All elements of maximum barrier sterile technique were employed (cap, mask, sterile gown and gloves, large sterile sheet, hand hygiene, betadine scrub or alternative skin antisepsis). After local anesthesia was provided with 1% lidocaine without epinephrine to the skin and deeper tissues  spinal needles were inserted. Under meticulous sterile technique, 15, 15, 20 cm, and 20 cm 22-gauge needles were advanced fluoroscopically into the disc spaces of L2-L3, L3-L4, L5-S1 and L4-L5 using intermittent fluoroscopy. One satisfactory needle position was achieved, contrast was injected. L2-L3: Total volume injected: 1.5 mL. Opening pressure was 5 PSI. Pressure endpoint 15 (disc would not pressurize) PSI. Pain reported 5 out of 10 (1 - 10 scale). Sensation described as non concordant. Pressure in the low back. Pattern of opacification showed circumferential annular fissuring and diffuse disc degeneration. No convincing epidural spread of contrast. Anterior spread of contrast extended to the anterior annulus. Representative images obtained in AP and lateral projections. L3-L4: Total volume injected: 1.5 milliliters. Opening pressure was 20 PSI. Pressure endpoint 50 PSI. Pain reported 9 out of 10 (1 - 10 scale). Sensation described as concordant pain in the low back. Axial lumbago/low back pain. Pattern of opacification showed diffuse annular tearing without epidural spread of contrast. Representative images obtained in AP and lateral projections. L4-L5: Total volume injected: 2.0 milliliters. Opening pressure was 5 PSI. Pressure endpoint 20 PSI (disc would not pressurize). Pain reported 10 out of 10 (1 - 10 scale). Sensation described as concordant pain. Pattern of opacification showed diffuse annular tearing without epidural spread of  contrast. Representative images obtained in AP and lateral projections. L5-S1: Total volume injected: 1.5 milliliters. Opening pressure was 10 PSI. Pressure endpoint 50 PSI. Pain reported 9 out of 10 (1 - 10 scale). Sensation described as pain radiating to the RIGHT hip which occurs intermittently with the lumbago/low back pain on a daily basis. The RIGHT hip pain match to every day symptoms. There is also radiation of pain to the RIGHT groin which is not a typical feature of back pain. Pattern of opacification showed . Representative images obtained in AP and lateral projections. Sedation was administered with 2 mg of intravenous Versed, 100 mcg Fentanyl IV. 30 milligrams of IV Toradol. CT POST-DISCOGRAM TECHNIQUE: Contiguous axial images were obtained from the inferior aspect of L2 through S1. Coronal and sagittal reconstructions of the disc spaces were created. FINDINGS: Aortoiliac atherosclerosis. Small calcification is present along the anterior wall of the inferior vena cava. Mild sacroiliac joint degenerative disease. L2-L3: Intra nuclear opacification with diffuse anterior annular tearing. Annular tearing is grade 5 with anterior spread of contrast along the superficial aspect of the anterior annulus. There is no stenosis. Shallow broad-based disc bulging is present. L3-L4: Diffuse annular tearing. Moderate LEFT facet arthrosis. The central canal appears adequately patent. Grade IV annular tearing is present anteriorly. Mild symmetric foraminal stenosis secondary to short pedicles and bulging disc. L4-L5: Diffuse circumferential annular tearing. Epidural spread of contrast on the LEFT compatible with a grade 5 annular tear in the LEFT subarticular zone. Broad-based posterior disc bulging. Central canal appears adequately patent. Neural foramina appear patent. L5-S1: Preserved disc height and normal nuclear opacification. No stenosis. Facet joints appear normal. IMPRESSION: LUMBAR DISCOGRAM IMPRESSION:  Technically successful 4 level lumbar discogram from L2-L3 through L5-S1. Concordant back pain at L3-L4, L4-L5 and L5-S1 although the L5-S1 was not an exact match. The patient felt that this level did reproduce RIGHT hip pain which she experiences on an every day basis. Normal L2-L3 control level. CT POST-DISCOGRAM IMPRESSION: Diffuse annular tearing from L2-L3 through L4-L5. Normal appearance of the disc at L5-S1. Electronically Signed   By: Dereck Ligas M.D.   On: 08/10/2015 09:55   Dg Diskogram Lumbar  08/10/2015  CLINICAL DATA:  Lumbar degenerative disc disease.  Axial lumbago/low back pain. RIGHT hip pain. EXAM: LUMBAR DISCOGRAM L2-L3, L3-L4, L4-L5, L5-S1 FLUOROSCOPY TIME:  4 minutes 59 seconds Dose area product: 830.45 microGy*m^2 PROCEDURE: After a thorough discussion of risks and benefits of the procedure, written and oral informed consent was obtained. Specific risks included discitis, infection of soft tissue and/or bone, and nontarget injection. Accelerated progression of degenerative disk disease was also discussed. General risks of the procedure including bleeding, infection, and injury to nerves, blood vessels, and adjacent structures. Verbal consent was obtained by Dr. Gerilyn Pilgrim prior to the procedure. Discography was performed via a RIGHT posterior oblique approach. Prior to prep and draped in the usual sterile fashion, the entry sites were marked using fluoroscopy. Subsequently, using stringent sterile technique, the back was prepped and draped. 400 mg IV Cipro as an IV antibiotic was completed 30 minutes prior to needle stick. 1 mL Antibiotic was also mixed with Omnipaque-180 used for disc injection. All elements of maximum barrier sterile technique were employed (cap, mask, sterile gown and gloves, large sterile sheet, hand hygiene, betadine scrub or alternative skin antisepsis). After local anesthesia was provided with 1% lidocaine without epinephrine to the skin and deeper tissues spinal  needles were inserted. Under meticulous sterile technique, 15, 15, 20 cm, and 20 cm 22-gauge needles were advanced fluoroscopically into the disc spaces of L2-L3, L3-L4, L5-S1 and L4-L5 using intermittent fluoroscopy. One satisfactory needle position was achieved, contrast was injected. L2-L3: Total volume injected: 1.5 mL. Opening pressure was 5 PSI. Pressure endpoint 15 (disc would not pressurize) PSI. Pain reported 5 out of 10 (1 - 10 scale). Sensation described as non concordant. Pressure in the low back. Pattern of opacification showed circumferential annular fissuring and diffuse disc degeneration. No convincing epidural spread of contrast. Anterior spread of contrast extended to the anterior annulus. Representative images obtained in AP and lateral projections. L3-L4: Total volume injected: 1.5 milliliters. Opening pressure was 20 PSI. Pressure endpoint 50 PSI. Pain reported 9 out of 10 (1 - 10 scale). Sensation described as concordant pain in the low back. Axial lumbago/low back pain. Pattern of opacification showed diffuse annular tearing without epidural spread of contrast. Representative images obtained in AP and lateral projections. L4-L5: Total volume injected: 2.0 milliliters. Opening pressure was 5 PSI. Pressure endpoint 20 PSI (disc would not pressurize). Pain reported 10 out of 10 (1 - 10 scale). Sensation described as concordant pain. Pattern of opacification showed diffuse annular tearing without epidural spread of contrast. Representative images obtained in AP and lateral projections. L5-S1: Total volume injected: 1.5 milliliters. Opening pressure was 10 PSI. Pressure endpoint 50 PSI. Pain reported 9 out of 10 (1 - 10 scale). Sensation described as pain radiating to the RIGHT hip which occurs intermittently with the lumbago/low back pain on a daily basis. The RIGHT hip pain match to every day symptoms. There is also radiation of pain to the RIGHT groin which is not a typical feature of back pain.  Pattern of opacification showed . Representative images obtained in AP and lateral projections. Sedation was administered with 2 mg of intravenous Versed, 100 mcg Fentanyl IV. 30 milligrams of IV Toradol. CT POST-DISCOGRAM TECHNIQUE: Contiguous axial images were obtained from the inferior aspect of L2 through S1. Coronal and sagittal reconstructions of the disc spaces were created. FINDINGS: Aortoiliac atherosclerosis. Small calcification is present along the anterior wall of the inferior vena cava. Mild sacroiliac joint degenerative disease. L2-L3: Intra nuclear opacification with diffuse anterior annular tearing. Annular tearing is grade 5 with anterior spread  of contrast along the superficial aspect of the anterior annulus. There is no stenosis. Shallow broad-based disc bulging is present. L3-L4: Diffuse annular tearing. Moderate LEFT facet arthrosis. The central canal appears adequately patent. Grade IV annular tearing is present anteriorly. Mild symmetric foraminal stenosis secondary to short pedicles and bulging disc. L4-L5: Diffuse circumferential annular tearing. Epidural spread of contrast on the LEFT compatible with a grade 5 annular tear in the LEFT subarticular zone. Broad-based posterior disc bulging. Central canal appears adequately patent. Neural foramina appear patent. L5-S1: Preserved disc height and normal nuclear opacification. No stenosis. Facet joints appear normal. IMPRESSION: LUMBAR DISCOGRAM IMPRESSION: Technically successful 4 level lumbar discogram from L2-L3 through L5-S1. Concordant back pain at L3-L4, L4-L5 and L5-S1 although the L5-S1 was not an exact match. The patient felt that this level did reproduce RIGHT hip pain which she experiences on an every day basis. Normal L2-L3 control level. CT POST-DISCOGRAM IMPRESSION: Diffuse annular tearing from L2-L3 through L4-L5. Normal appearance of the disc at L5-S1. Electronically Signed   By: Dereck Ligas M.D.   On: 08/10/2015 09:55        Assessment & Plan:   Problem List Items Addressed This Visit    Anxiety, generalized    Seeing psychiatry.        Arnold-Chiari malformation (HCC)    Has a history of chiari malformation.  S/p surgery.  Is seeing neurology.  Planning for f/u MRI as outlined.        Back pain    Chronic back pain.  Seeing Dr Brien Few.  Was referred to [facility].  Not felt to be a surgical candidate.  Refer back to Dr Brien Few.        Depression, major, severe recurrence [facility])    Seeing psychiatry.  Adjusting medications.        Relevant Medications   diazepam (VALIUM) 5 MG tablet   GERD (gastroesophageal reflux disease)    Has seen GI.  On nexium.        Headache, migraine    Seeing neurology.  Planning f/u MRI.        Relevant Medications   atorvastatin (LIPITOR) 10 MG tablet   Hypercholesteremia    Low cholesterol diet and exercise.  Unable to afford crestor.  Discussed generic lipitor.  Will start 10mg  q day.  Follow.        Relevant Medications   atorvastatin (LIPITOR) 10 MG tablet   Other Relevant Orders   Lipid panel   Hepatic function panel   Lung nodule    Previous CT with lung nodule.  Due f/u.  Need to schedule.        Obesity (BMI 30-39.9)    Discussed diet and exercise.  Follow.       Obstructive apnea    CPAP.       Relevant Orders   CBC with Differential/Platelet   Basic metabolic panel   Skin lesion - Primary    Persistent lower abdominal lesion.  Refer to dermatology for further evaluation.        Relevant Orders   Ambulatory referral to Dermatology       [name], MD

## 2015-09-21 ENCOUNTER — Ambulatory Visit (INDEPENDENT_AMBULATORY_CARE_PROVIDER_SITE_OTHER): Payer: Medicaid Other | Admitting: Licensed Clinical Social Worker

## 2015-09-21 DIAGNOSIS — R419 Unspecified symptoms and signs involving cognitive functions and awareness: Secondary | ICD-10-CM

## 2015-09-21 DIAGNOSIS — F99 Mental disorder, not otherwise specified: Secondary | ICD-10-CM

## 2015-09-21 DIAGNOSIS — F332 Major depressive disorder, recurrent severe without psychotic features: Secondary | ICD-10-CM

## 2015-09-21 DIAGNOSIS — F411 Generalized anxiety disorder: Secondary | ICD-10-CM | POA: Diagnosis not present
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
## 2015-09-24 ENCOUNTER — Encounter: Payer: Self-pay | Admitting: Internal Medicine

## 2015-09-24 NOTE — Assessment & Plan Note (Signed)
Discussed diet and exercise.  Follow.  

## 2015-09-24 NOTE — Assessment & Plan Note (Signed)
Seeing psychiatry

## 2015-09-24 NOTE — Assessment & Plan Note (Signed)
Has seen GI.  On nexium.

## 2015-09-24 NOTE — Assessment & Plan Note (Signed)
Seeing psychiatry.  Adjusting medications.

## 2015-09-24 NOTE — Assessment & Plan Note (Signed)
Previous CT with lung nodule.  Due f/u.  Need to schedule.

## 2015-09-24 NOTE — Assessment & Plan Note (Signed)
Chronic back pain.  Seeing Dr Brien Few.  Was referred to [facility].  Not felt to be a surgical candidate.  Refer back to Dr Brien Few.

## 2015-09-24 NOTE — Assessment & Plan Note (Signed)
Seeing neurology.  Planning f/u MRI.

## 2015-09-24 NOTE — Assessment & Plan Note (Signed)
Low cholesterol diet and exercise.  Unable to afford crestor.  Discussed generic lipitor.  Will start 10mg  q day.  Follow.

## 2015-09-24 NOTE — Assessment & Plan Note (Signed)
Persistent lower abdominal lesion.  Refer to dermatology for further evaluation.

## 2015-09-24 NOTE — Assessment & Plan Note (Signed)
Has a history of chiari malformation.  S/p surgery.  Is seeing neurology.  Planning for f/u MRI as outlined.

## 2015-09-24 NOTE — Assessment & Plan Note (Signed)
CPAP.  

## 2015-09-25 ENCOUNTER — Ambulatory Visit: Payer: Self-pay | Admitting: Licensed Clinical Social Worker

## 2015-09-25 ENCOUNTER — Ambulatory Visit (INDEPENDENT_AMBULATORY_CARE_PROVIDER_SITE_OTHER): Payer: Medicaid Other | Admitting: Psychiatry

## 2015-09-25 ENCOUNTER — Encounter: Payer: Self-pay | Admitting: Psychiatry

## 2015-09-25 VITALS — BP 112/82 | HR 86 | Temp 98.1°F | Ht 71.0 in | Wt 263.8 lb

## 2015-09-25 DIAGNOSIS — F99 Mental disorder, not otherwise specified: Secondary | ICD-10-CM

## 2015-09-25 DIAGNOSIS — F332 Major depressive disorder, recurrent severe without psychotic features: Secondary | ICD-10-CM

## 2015-09-25 DIAGNOSIS — F411 Generalized anxiety disorder: Secondary | ICD-10-CM

## 2015-09-25 DIAGNOSIS — R419 Unspecified symptoms and signs involving cognitive functions and awareness: Secondary | ICD-10-CM

## 2015-09-25 MED ORDER — BUPROPION HCL 100 MG PO TABS: ORAL_TABLET | ORAL | Status: DC

## 2015-09-25 NOTE — Progress Notes (Signed)
BH MD/PA/NP OP Progress Note  09/25/2015 9:02 AM Kathleen Everett  MRN:  [id number]  Subjective:  Patient returns a follow-up of her major depressive disorder, severe and generalized anxiety disorder.  Today that her main complaint is perhaps being paranoid. She states for example she does not like to be out. She states that she went out to a restaurant with her husband and felt uncomfortable anxious and worried there. She also states she might have some OCD because she noticed that she sometimes sees certain things and then might begin to count them. She states this is always been an issue and is not anything new.  At the last visit we started Wellbutrin regular release because the patient stated that she did not feel the sustain release form work for her. She states that since being on the medicine she might cry less but otherwise is not noticing a dramatic difference. I did discuss that since she is on it we might want to try to maximize her dose further.  She indicated another stressor for her is trying to address her back pain. She states she did see a back surgeon who told her that given the number of disc that would need to be fused that she was not a good surgical candidate because the results would have possibly of not being good. She did state that surgeon stated she could find other surgeons to probably would do her surgery. Chief Complaint: mood is up and down Chief Complaint    Follow-up; Medication Refill     Visit Diagnosis:   No diagnosis found.   Past Medical History:  Past Medical History  Diagnosis Date  . Hyperlipidemia   . Chronic tension headaches   . Dysmenorrhea   . Hidradenitis   . Endometriosis   . Depression   . Osteoarthritis   . Obesity   . Ovarian cyst     hx  . GERD (gastroesophageal reflux disease)   . Chiari malformation     s/p sgy decompression  . Back pain   . Gas pain   . Vitamin B12 deficiency     On injections    Past Surgical History   Procedure Laterality Date  . Cystoscopy      bladder ulcers  . Cesarean section      1999/2004 twins  . Temporomandibular joint surgery  2000  . Ovarian cyst removal  2001    right  . Breast biopsy      fibroadenoma  . Hernia repair  2003  . Tubal ligation      Bilateral  . Abdominal hysterectomy  03/27/07    laparoscopic  . Chiari decompression    . Esophagogastroduodenoscopy N/A 04/28/2015    Procedure: ESOPHAGOGASTRODUODENOSCOPY (EGD);  Surgeon: Manya Silvas, MD;  Location: [facility] ENDOSCOPY;  Service: Endoscopy;  Laterality: N/A;  . Colonoscopy with propofol N/A 04/28/2015    Procedure: COLONOSCOPY WITH PROPOFOL;  Surgeon: Manya Silvas, MD;  Location: [facility] ENDOSCOPY;  Service: Endoscopy;  Laterality: N/A;   Family History:  Family History  Problem Relation Age of Onset  . Hyperlipidemia Mother   . Anxiety disorder Mother   . Atrial fibrillation Mother   . Hypertension Mother   . Anxiety disorder Father   . Stroke Father   . Hyperlipidemia Father   . Hypertension Father   . Congestive Heart Failure Father   . Depression Father   . Breast cancer Neg Hx   . Colon cancer Paternal Aunt   .  Hyperlipidemia Brother   . Dementia Maternal Grandmother   . Dementia Paternal Grandmother    Social History:  Social History   Social History  . Marital Status: Married    Spouse Name: N/A  . Number of Children: 3  . Years of Education: 31   Social History Main Topics  . Smoking status: Never Smoker   . Smokeless tobacco: Never Used  . Alcohol Use: 0.0 oz/week    0 Standard drinks or equivalent per week     Comment: rarely  . Drug Use: No  . Sexual Activity: Yes    Birth Control/ Protection: None   Other Topics Concern  . None   Social History Narrative   Patient drinks 2-3 cups of caffeine daily.   Patient is right handed.    Additional History:   Assessment:   Musculoskeletal: Strength & Muscle Tone: within normal limits Gait & Station: normal Patient  leans: N/A  Psychiatric Specialty Exam: Anxiety Patient reports no insomnia (she reports this may be a little bit better since she's been on the Seroquel), nervous/anxious behavior or suicidal ideas.    Depression        Associated symptoms include does not have insomnia (she reports this may be a little bit better since she's been on the Seroquel) and no suicidal ideas.  Past medical history includes anxiety.     Review of Systems  Psychiatric/Behavioral: Positive for memory loss. Negative for depression, suicidal ideas, hallucinations and substance abuse. The patient is not nervous/anxious and does not have insomnia (she reports this may be a little bit better since she's been on the Seroquel).   All other systems reviewed and are negative.   Blood pressure 112/82, pulse 86, temperature 98.1 F (36.7 C), temperature source Tympanic, height 5\' 11"  (1.803 m), weight 263 lb 12.8 oz (119.659 kg), last menstrual period 12/08/2006, SpO2 98 %.Body mass index is 36.81 kg/(m^2).  General Appearance: Well Groomed  Eye Contact:  Good  Speech:  Clear and Coherent  Volume:  Normal  Mood:  stressed  Affect:  Constricted  Thought Process:  Linear  Orientation:  Full (Time, Place, and Person)  Thought Content:  Negative  Suicidal Thoughts:  No  Homicidal Thoughts:  No  Memory:  Immediate;   Good Recent;   Fair Remote;   Poor  Judgement:  Good  Insight:  Good  Psychomotor Activity:  Normal  Concentration:  Good  Recall:  Poor  Fund of Knowledge: Good  Language: Good  Akathisia:  Negative  Handed:  Right unknown  AIMS (if indicated):  Done today, to be scanned to chart  Assets:  Communication Skills Desire for Improvement  ADL's:  Intact  Cognition: WNL  Sleep:  As above, fair   Is the patient at risk to self?  No. Has the patient been a risk to self in the past 6 months?  No. Has the patient been a risk to self within the distant past?  No. Is the patient a risk to others?  No. Has  the patient been a risk to others in the past 6 months?  No. Has the patient been a risk to others within the distant past?  No.  Current Medications: Current Outpatient Prescriptions  Medication Sig Dispense Refill  . ALPRAZolam (XANAX) 0.25 MG tablet Take 1 tablet (0.25 mg total) by mouth 2 (two) times daily as needed for anxiety. 60 tablet 1  . atorvastatin (LIPITOR) 10 MG tablet Take 1 tablet (10 mg total) by  mouth daily. 30 tablet 2  . buPROPion (WELLBUTRIN) 100 MG tablet Take two tablets in the morning and one tablet in the afternoon. 90 tablet 1  . cholecalciferol (VITAMIN D) 1000 UNITS tablet Take 2,000 Units by mouth daily.    . cyanocobalamin (,VITAMIN B-12,) 1000 MCG/ML injection INJECT ONE ML (CC) INTRAMUSCULARLY ONCE EVERY MONTH 30 mL 2  . diazepam (VALIUM) 5 MG tablet Take 5 mg by mouth every 6 (six) hours as needed for anxiety.    Marland Kitchen esomeprazole (NEXIUM) 40 MG capsule Take 1 capsule (40 mg total) by mouth 2 (two) times daily before a meal. 60 capsule 5  . FLUoxetine (PROZAC) 20 MG capsule Take 3 capsules (60 mg total) by mouth daily. 90 capsule 3  . furosemide (LASIX) 20 MG tablet Take 1 tablet (20 mg total) by mouth daily as needed for edema. 30 tablet 6  . Magnesium Oxide 400 (240 MG) MG TABS One tablet daily 30 tablet 1  . mupirocin ointment (BACTROBAN) 2 % Place 1 application into the nose 2 (two) times daily. 22 g 0  . Nutritional Supplements (JUICE PLUS FIBRE PO) Take by mouth.    . potassium chloride (K-DUR) 10 MEQ tablet Take 1 tablet (10 mEq total) by mouth daily as needed (with lasix). 30 tablet 6  . QUEtiapine (SEROQUEL) 100 MG tablet Take one half a tablet at bedtime for one week, then increase to one whole tablet at bedtime. 30 tablet 1  . SUMAtriptan (IMITREX) 50 MG tablet Take 50 mg by mouth as needed for migraine or headache. May repeat in 2 hours if headache persists or recurs.    . topiramate (TOPAMAX) 100 MG tablet Take 1 tablet (100 mg total) by mouth at  bedtime. 30 tablet 3  . UNABLE TO FIND 1 application as needed. Med Name: Lidocaine cream    . oxyCODONE-acetaminophen (PERCOCET) 10-325 MG per tablet Take 1 tablet by mouth every 6 (six) hours as needed for pain.      No current facility-administered medications for this visit.    Medical Decision Making:  Established Problem, Stable/Improving (1)  Treatment Plan Summary:Medication management   Major depressive disorder, recurrent moderate we will continue Prozac to 60 mg daily and seroquel 100 mg at bedtime. We will increase from Wellbutrin 100 mg twice daily to 200 mg in the morning and 100 mg in the afternoon..   Generalized anxieties order-Prozac as above. Patient still has alprazolam however she states her pain doctor told her not to take this anymore because the pain doctors giving her Valium for muscle relaxation. Continue therapy.  Patient will follow up in 1 month. She's been encouraged call any questions or concerns prior to her next appointment.    Faith Rogue 09/25/2015, 9:02 AM

## 2015-10-02 ENCOUNTER — Encounter: Payer: Self-pay | Admitting: Internal Medicine

## 2015-10-05 ENCOUNTER — Ambulatory Visit (INDEPENDENT_AMBULATORY_CARE_PROVIDER_SITE_OTHER): Payer: Medicaid Other | Admitting: Licensed Clinical Social Worker

## 2015-10-05 DIAGNOSIS — F331 Major depressive disorder, recurrent, moderate: Secondary | ICD-10-CM | POA: Diagnosis not present

## 2015-10-09 DIAGNOSIS — D239 Other benign neoplasm of skin, unspecified: Secondary | ICD-10-CM

## 2015-10-09 HISTORY — DX: Other benign neoplasm of skin, unspecified: D23.9

## 2015-10-09 NOTE — Progress Notes (Signed)
   THERAPIST PROGRESS NOTE  Session Time: 29min  Participation Level: Active  Behavioral Response: CasualAlertDepressed  Type of Therapy: Individual Therapy  Treatment Goals addressed: Coping  Interventions: CBT, Motivational Interviewing, Solution Focused, Supportive, Family Systems and Reframing  Summary: Jaleisa Bhat is a 44 y.o. female who presents with continued symptoms of her diagnosis.  Discussion of change in therapist. Discussed her current stressors and coping skills that have worked in the past.  Discussed her current mental status, medical concerns and the correlation with her depression  Suicidal/Homicidal: Nowithout intent/plan  Therapist Response: LCSW provided Patient with ongoing emotional support and encouragement.  Normalized her feelings.  Commended Patient on her progress and reinforced the importance of client staying focused on her own strengths and resources and resiliency. Processed various strategies for dealing with stressors.   Plan: Return again in 2 weeks.  Diagnosis: Axis I: Major Depression, Recurrent severe    Axis II: No diagnosis    Lubertha South 10/09/2015

## 2015-10-19 ENCOUNTER — Ambulatory Visit (INDEPENDENT_AMBULATORY_CARE_PROVIDER_SITE_OTHER): Payer: Medicaid Other | Admitting: Licensed Clinical Social Worker

## 2015-10-19 DIAGNOSIS — F331 Major depressive disorder, recurrent, moderate: Secondary | ICD-10-CM

## 2015-10-23 ENCOUNTER — Encounter: Payer: Self-pay | Admitting: Psychiatry

## 2015-10-23 ENCOUNTER — Ambulatory Visit (INDEPENDENT_AMBULATORY_CARE_PROVIDER_SITE_OTHER): Payer: Medicaid Other | Admitting: Psychiatry

## 2015-10-23 VITALS — BP 122/84 | HR 88 | Temp 97.8°F | Ht 71.0 in | Wt 263.8 lb

## 2015-10-23 DIAGNOSIS — F332 Major depressive disorder, recurrent severe without psychotic features: Secondary | ICD-10-CM

## 2015-10-23 DIAGNOSIS — F411 Generalized anxiety disorder: Secondary | ICD-10-CM

## 2015-10-23 MED ORDER — ARIPIPRAZOLE 2 MG PO TABS: 2.0000 mg | ORAL_TABLET | ORAL | Status: DC

## 2015-10-23 MED ORDER — BUPROPION HCL ER (XL) 300 MG PO TB24: 300.0000 mg | ORAL_TABLET | Freq: Every day | ORAL | Status: DC

## 2015-10-23 MED ORDER — FLUOXETINE HCL 20 MG PO CAPS: 60.0000 mg | ORAL_CAPSULE | Freq: Every day | ORAL | Status: DC

## 2015-10-23 NOTE — Progress Notes (Signed)
[place] MD/PA/NP OP Progress Note  10/23/2015 9:03 AM Kathleen Everett  MRN:  [id number]  Subjective:  Patient returns a follow-up of her major depressive disorder, severe and generalized anxiety disorder.  She states today her main issue is being depressed. She states that she is down at the holidays. She states her children try to engage her in things such as playing cards and she does not want to do that.  She cannot take the Seroquel cousin makes her nauseous. I reviewed her chart and discussed with her she has not had augmentation with Abilify and thus were going to proceed with this today.  Patient indicated that with the Wellbutrin which we had increased the last time to Wellbutrin SR 200 mg in the morning 100 mg in the afternoon at times she's forgetting her afternoon dose.  States her sleep is variable. She states that sometimes her thoughts are racing and she is very anxious. She states him as she worries about things such as whether she's locked car and then will go back and check.  I did discuss the patient that I will be departing the practice in February. Chief Complaint: Down  Chief Complaint    Follow-up; Medication Refill     Visit Diagnosis:     ICD-9-CM ICD-10-CM   1. Major depressive disorder, recurrent, severe without psychotic features ([place]) 296.33 F33.2   2. Generalized anxiety disorder 300.02 F41.1      Past Medical History:  Past Medical History  Diagnosis Date  . Hyperlipidemia   . Chronic tension headaches   . Dysmenorrhea   . Hidradenitis   . Endometriosis   . Depression   . Osteoarthritis   . Obesity   . Ovarian cyst     hx  . GERD (gastroesophageal reflux disease)   . Chiari malformation     s/p sgy decompression  . Back pain   . Gas pain   . Vitamin B12 deficiency     On injections    Past Surgical History  Procedure Laterality Date  . Cystoscopy      bladder ulcers  . Cesarean section      1999/2004 twins  . Temporomandibular joint  surgery  2000  . Ovarian cyst removal  2001    right  . Breast biopsy      fibroadenoma  . Hernia repair  2003  . Tubal ligation      Bilateral  . Abdominal hysterectomy  03/27/07    laparoscopic  . Chiari decompression    . Esophagogastroduodenoscopy N/A 04/28/2015    Procedure: ESOPHAGOGASTRODUODENOSCOPY (EGD);  Surgeon: Manya Silvas, MD;  Location: [facility] ENDOSCOPY;  Service: Endoscopy;  Laterality: N/A;  . Colonoscopy with propofol N/A 04/28/2015    Procedure: COLONOSCOPY WITH PROPOFOL;  Surgeon: Manya Silvas, MD;  Location: [facility] ENDOSCOPY;  Service: Endoscopy;  Laterality: N/A;   Family History:  Family History  Problem Relation Age of Onset  . Hyperlipidemia Mother   . Anxiety disorder Mother   . Atrial fibrillation Mother   . Hypertension Mother   . Anxiety disorder Father   . Stroke Father   . Hyperlipidemia Father   . Hypertension Father   . Congestive Heart Failure Father   . Depression Father   . Breast cancer Neg Hx   . Colon cancer Paternal Aunt   . Hyperlipidemia Brother   . Dementia Maternal Grandmother   . Dementia Paternal Grandmother    Social History:  Social History   Social History  .  Marital Status: Married    Spouse Name: N/A  . Number of Children: 3  . Years of Education: 27   Social History Main Topics  . Smoking status: Never Smoker   . Smokeless tobacco: Never Used  . Alcohol Use: 0.0 oz/week    0 Standard drinks or equivalent per week     Comment: rarely  . Drug Use: No  . Sexual Activity: Yes    Birth Control/ Protection: None   Other Topics Concern  . None   Social History Narrative   Patient drinks 2-3 cups of caffeine daily.   Patient is right handed.    Additional History:   Assessment:   Musculoskeletal: Strength & Muscle Tone: within normal limits Gait & Station: normal Patient leans: N/A  Psychiatric Specialty Exam: Anxiety Patient reports no insomnia, nervous/anxious behavior or suicidal ideas.     Depression        Associated symptoms include does not have insomnia and no suicidal ideas.  Past medical history includes anxiety.     Review of Systems  Psychiatric/Behavioral: Positive for depression and memory loss. Negative for suicidal ideas, hallucinations and substance abuse. The patient is not nervous/anxious and does not have insomnia.   All other systems reviewed and are negative.   Blood pressure 122/84, pulse 88, temperature 97.8 F (36.6 C), temperature source Tympanic, height 5\' 11"  (1.803 m), weight 263 lb 12.8 oz (119.659 kg), last menstrual period 12/08/2006, SpO2 98 %.Body mass index is 36.81 kg/(m^2).  General Appearance: Well Groomed  Eye Contact:  Good  Speech:  Clear and Coherent  Volume:  Normal  Mood:  Down  Affect:  Constricted  Thought Process:  Linear  Orientation:  Full (Time, Place, and Person)  Thought Content:  Negative  Suicidal Thoughts:  No  Homicidal Thoughts:  No  Memory:  Immediate;   Good Recent;   Fair Remote;   Poor  Judgement:  Good  Insight:  Good  Psychomotor Activity:  Normal  Concentration:  Good  Recall:  Poor  Fund of Knowledge: Good  Language: Good  Akathisia:  Negative  Handed:  Right unknown  AIMS (if indicated):  Done today, to be scanned to chart  Assets:  Communication Skills Desire for Improvement  ADL's:  Intact  Cognition: WNL  Sleep:  As above, fair   Is the patient at risk to self?  No. Has the patient been a risk to self in the past 6 months?  No. Has the patient been a risk to self within the distant past?  No. Is the patient a risk to others?  No. Has the patient been a risk to others in the past 6 months?  No. Has the patient been a risk to others within the distant past?  No.  Current Medications: Current Outpatient Prescriptions  Medication Sig Dispense Refill  . atorvastatin (LIPITOR) 10 MG tablet Take 1 tablet (10 mg total) by mouth daily. 30 tablet 2  . cholecalciferol (VITAMIN D) 1000 UNITS  tablet Take 2,000 Units by mouth daily.    . cyanocobalamin (,VITAMIN B-12,) 1000 MCG/ML injection INJECT ONE ML (CC) INTRAMUSCULARLY ONCE EVERY MONTH 30 mL 2  . diazepam (VALIUM) 5 MG tablet Take 5 mg by mouth every 6 (six) hours as needed for anxiety.    Marland Kitchen esomeprazole (NEXIUM) 40 MG capsule Take 1 capsule (40 mg total) by mouth 2 (two) times daily before a meal. 60 capsule 5  . FLUoxetine (PROZAC) 20 MG capsule Take 3 capsules (60  mg total) by mouth daily. 90 capsule 3  . furosemide (LASIX) 20 MG tablet Take 1 tablet (20 mg total) by mouth daily as needed for edema. 30 tablet 6  . Magnesium Oxide 400 (240 MG) MG TABS One tablet daily 30 tablet 1  . mupirocin ointment (BACTROBAN) 2 % Place 1 application into the nose 2 (two) times daily. 22 g 0  . Nutritional Supplements (JUICE PLUS FIBRE PO) Take by mouth.    . oxyCODONE-acetaminophen (PERCOCET) 10-325 MG per tablet Take 1 tablet by mouth every 6 (six) hours as needed for pain.     . potassium chloride (K-DUR) 10 MEQ tablet Take 1 tablet (10 mEq total) by mouth daily as needed (with lasix). 30 tablet 6  . SUMAtriptan (IMITREX) 50 MG tablet Take 50 mg by mouth as needed for migraine or headache. May repeat in 2 hours if headache persists or recurs.    . topiramate (TOPAMAX) 100 MG tablet Take 1 tablet (100 mg total) by mouth at bedtime. 30 tablet 3  . ARIPiprazole (ABILIFY) 2 MG tablet Take 1 tablet (2 mg total) by mouth every morning. 30 tablet 3  . buPROPion (WELLBUTRIN XL) 300 MG 24 hr tablet Take 1 tablet (300 mg total) by mouth daily. 30 tablet 3  . UNABLE TO FIND 1 application as needed. Med Name: Lidocaine cream     No current facility-administered medications for this visit.    Medical Decision Making:  Established Problem, Stable/Improving (1)  Treatment Plan Summary:Medication management   Major depressive disorder, recurrent moderate we will continue Prozac to 60 mg daily. We will discontinue the Seroquel per her complaints of  nausea above. We will start some Abilify 2 mg in the morning. She has been taking Wellbutrin until last visit we did increase her Wellbutrin SR dose to 200 and the morning and 100 mg in the afternoon. However patient states she was forgetting her afternoon dose. Thus we will which to Wellbutrin XL 300 mg in the morning.  Generalized anxieties order-Prozac as above. Patient still has alprazolam however she states her pain doctor told her not to take this anymore because the pain doctors giving her Valium for muscle relaxation. Continue therapy.  Patient will follow up in 1 month. She's been encouraged call any questions or concerns prior to her next appointment.    Faith Rogue 10/23/2015, 9:03 AM

## 2015-10-25 NOTE — Progress Notes (Signed)
   THERAPIST PROGRESS NOTE  Session Time: 60 min  Participation Level: Active  Behavioral Response: CasualAlertDepressed  Type of Therapy: Individual Therapy  Treatment Goals addressed: Coping  Interventions: CBT, Motivational Interviewing, Solution Focused, Supportive, Family Systems and Reframing  Summary: Milayna Amerson is a 44 y.o. female who presents with continued symptoms of her diagnosis.  She continues to have medical concerns which aid in her stress.  She reports continued back pain which has her unable to assist her children with daily tasks.  Patient states that her husband is frustrated with her not being able to work and she is afraid that he will continue to detach from the family.  Patient states that she is not interested in sex and told her husband to "stop asking."  She reports a lack of communication.  Discussion of scheduled family time.  Suicidal/Homicidal: Nowithout intent/plan  Therapist Response: LCSW provided Patient with ongoing emotional support and encouragement.  Normalized her feelings.  Commended Patient on her progress and reinforced the importance of client staying focused on her own strengths and resources and resiliency. Processed various strategies for dealing with stressors.    Plan: Return again in 2 weeks.  Diagnosis: Axis I: Major Depression, Recurrent severe    Axis II: No diagnosis    Lubertha South 10/25/2015

## 2015-11-08 ENCOUNTER — Telehealth: Payer: Self-pay

## 2015-11-08 NOTE — Telephone Encounter (Signed)
Received records request [place], forwarded to [facility] for processing.

## 2015-11-09 ENCOUNTER — Ambulatory Visit (INDEPENDENT_AMBULATORY_CARE_PROVIDER_SITE_OTHER): Payer: Medicaid Other | Admitting: Licensed Clinical Social Worker

## 2015-11-09 DIAGNOSIS — F332 Major depressive disorder, recurrent severe without psychotic features: Secondary | ICD-10-CM | POA: Diagnosis not present
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
## 2015-11-21 ENCOUNTER — Ambulatory Visit (INDEPENDENT_AMBULATORY_CARE_PROVIDER_SITE_OTHER): Payer: Medicaid Other | Admitting: Licensed Clinical Social Worker

## 2015-11-21 ENCOUNTER — Encounter: Payer: Self-pay | Admitting: Psychiatry

## 2015-11-21 ENCOUNTER — Ambulatory Visit (INDEPENDENT_AMBULATORY_CARE_PROVIDER_SITE_OTHER): Payer: Medicaid Other | Admitting: Psychiatry

## 2015-11-21 VITALS — BP 124/86 | HR 97 | Temp 97.6°F | Ht 71.0 in | Wt 265.2 lb

## 2015-11-21 DIAGNOSIS — F331 Major depressive disorder, recurrent, moderate: Secondary | ICD-10-CM | POA: Diagnosis not present

## 2015-11-21 DIAGNOSIS — F411 Generalized anxiety disorder: Secondary | ICD-10-CM

## 2015-11-21 DIAGNOSIS — F332 Major depressive disorder, recurrent severe without psychotic features: Secondary | ICD-10-CM

## 2015-11-21 MED ORDER — BUPROPION HCL ER (XL) 450 MG PO TB24: 450.0000 mg | ORAL_TABLET | ORAL | Status: DC

## 2015-11-21 MED ORDER — BUSPIRONE HCL 10 MG PO TABS: ORAL_TABLET | ORAL | Status: DC

## 2015-11-21 NOTE — Progress Notes (Signed)
BH MD/PA/NP OP Progress Note  11/21/2015 10:13 AM Kathleen Everett  MRN:  [id number]  Subjective:  Patient returns a follow-up of her major depressive disorder, severe and generalized anxiety disorder.  Patient states she continues to feel "on edge." When asked which she sees as her biggest problem she feels like it's both depression and anxiety. She also discusses that she feels somewhat paranoid like someone's watching her. At the last visit we started Abilify which she states she's tolerated a little bit. She does not noticed a drastic difference other than she is not crying as much.  Patient asked if writer completed disability forms for lawyers. I stated I have done that in the past but typically they come from a disability carrier or Social Security.she indicates she does have her hearing in April and she feels like this is progress in her pursuits of disability. Chief Complaint: On edge Chief Complaint    Follow-up; Medication Refill     Visit Diagnosis:     ICD-9-CM ICD-10-CM   1. Major depressive disorder, recurrent, severe without psychotic features ([place]) 296.33 F33.2   2. Generalized anxiety disorder 300.02 F41.1      Past Medical History:  Past Medical History  Diagnosis Date  . Hyperlipidemia   . Chronic tension headaches   . Dysmenorrhea   . Hidradenitis   . Endometriosis   . Depression   . Osteoarthritis   . Obesity   . Ovarian cyst     hx  . GERD (gastroesophageal reflux disease)   . Chiari malformation     s/p sgy decompression  . Back pain   . Gas pain   . Vitamin B12 deficiency     On injections    Past Surgical History  Procedure Laterality Date  . Cystoscopy      bladder ulcers  . Cesarean section      1999/2004 twins  . Temporomandibular joint surgery  2000  . Ovarian cyst removal  2001    right  . Breast biopsy      fibroadenoma  . Hernia repair  2003  . Tubal ligation      Bilateral  . Abdominal hysterectomy  03/27/07    laparoscopic   . Chiari decompression    . Esophagogastroduodenoscopy N/A 04/28/2015    Procedure: ESOPHAGOGASTRODUODENOSCOPY (EGD);  Surgeon: Manya Silvas, MD;  Location: [facility] ENDOSCOPY;  Service: Endoscopy;  Laterality: N/A;  . Colonoscopy with propofol N/A 04/28/2015    Procedure: COLONOSCOPY WITH PROPOFOL;  Surgeon: Manya Silvas, MD;  Location: [facility] ENDOSCOPY;  Service: Endoscopy;  Laterality: N/A;   Family History:  Family History  Problem Relation Age of Onset  . Hyperlipidemia Mother   . Anxiety disorder Mother   . Atrial fibrillation Mother   . Hypertension Mother   . Anxiety disorder Father   . Stroke Father   . Hyperlipidemia Father   . Hypertension Father   . Congestive Heart Failure Father   . Depression Father   . Breast cancer Neg Hx   . Colon cancer Paternal Aunt   . Hyperlipidemia Brother   . Dementia Maternal Grandmother   . Dementia Paternal Grandmother    Social History:  Social History   Social History  . Marital Status: Married    Spouse Name: N/A  . Number of Children: 3  . Years of Education: 47   Social History Main Topics  . Smoking status: Never Smoker   . Smokeless tobacco: Never Used  . Alcohol Use: 0.0  oz/week    0 Standard drinks or equivalent per week     Comment: rarely  . Drug Use: No  . Sexual Activity: Yes    Birth Control/ Protection: None   Other Topics Concern  . None   Social History Narrative   Patient drinks 2-3 cups of caffeine daily.   Patient is right handed.    Additional History:   Assessment:   Musculoskeletal: Strength & Muscle Tone: within normal limits Gait & Station: normal Patient leans: N/A  Psychiatric Specialty Exam: Anxiety Symptoms include nervous/anxious behavior. Patient reports no insomnia or suicidal ideas.    Depression        Associated symptoms include does not have insomnia and no suicidal ideas.  Past medical history includes anxiety.     Review of Systems  Psychiatric/Behavioral: Positive  for depression and memory loss. Negative for suicidal ideas, hallucinations and substance abuse. The patient is nervous/anxious. The patient does not have insomnia.   All other systems reviewed and are negative.   Blood pressure 124/86, pulse 97, temperature 97.6 F (36.4 C), temperature source Tympanic, height 5\' 11"  (1.803 m), weight 265 lb 3.2 oz (120.294 kg), last menstrual period 12/08/2006, SpO2 98 %.Body mass index is 37 kg/(m^2).  General Appearance: Well Groomed  Eye Contact:  Good  Speech:  Clear and Coherent  Volume:  Normal  Mood:  On edge  Affect:  still constricted but more reactive than in past visits  Thought Process:  Linear  Orientation:  Full (Time, Place, and Person)  Thought Content:  Negative  Suicidal Thoughts:  No  Homicidal Thoughts:  No  Memory:  Immediate;   Good Recent;   Fair Remote;   Poor  Judgement:  Good  Insight:  Good  Psychomotor Activity:  Normal  Concentration:  Good  Recall:  Poor  Fund of Knowledge: Good  Language: Good  Akathisia:  Negative  Handed:  Right unknown  AIMS (if indicated):  Done today, to be scanned to chart  Assets:  Communication Skills Desire for Improvement  ADL's:  Intact  Cognition: WNL  Sleep:  As above, fair   Is the patient at risk to self?  No. Has the patient been a risk to self in the past 6 months?  No. Has the patient been a risk to self within the distant past?  No. Is the patient a risk to others?  No. Has the patient been a risk to others in the past 6 months?  No. Has the patient been a risk to others within the distant past?  No.  Current Medications: Current Outpatient Prescriptions  Medication Sig Dispense Refill  . ARIPiprazole (ABILIFY) 2 MG tablet Take 1 tablet (2 mg total) by mouth every morning. 30 tablet 3  . atorvastatin (LIPITOR) 10 MG tablet Take 1 tablet (10 mg total) by mouth daily. 30 tablet 2  . BuPROPion HCl ER, XL, 450 MG TB24 Take 450 mg by mouth every morning. 30 tablet 1  .  cholecalciferol (VITAMIN D) 1000 UNITS tablet Take 2,000 Units by mouth daily.    . cyanocobalamin (,VITAMIN B-12,) 1000 MCG/ML injection INJECT ONE ML (CC) INTRAMUSCULARLY ONCE EVERY MONTH 30 mL 2  . diazepam (VALIUM) 5 MG tablet Take 5 mg by mouth every 6 (six) hours as needed for anxiety.    Marland Kitchen esomeprazole (NEXIUM) 40 MG capsule Take 1 capsule (40 mg total) by mouth 2 (two) times daily before a meal. 60 capsule 5  . FLUoxetine (PROZAC) 20 MG  capsule Take 3 capsules (60 mg total) by mouth daily. 90 capsule 3  . furosemide (LASIX) 20 MG tablet Take 1 tablet (20 mg total) by mouth daily as needed for edema. 30 tablet 6  . Magnesium Oxide 400 (240 MG) MG TABS One tablet daily 30 tablet 1  . mupirocin ointment (BACTROBAN) 2 % Place 1 application into the nose 2 (two) times daily. 22 g 0  . Nutritional Supplements (JUICE PLUS FIBRE PO) Take by mouth.    . potassium chloride (K-DUR) 10 MEQ tablet Take 1 tablet (10 mEq total) by mouth daily as needed (with lasix). 30 tablet 6  . SUMAtriptan (IMITREX) 50 MG tablet Take 50 mg by mouth as needed for migraine or headache. May repeat in 2 hours if headache persists or recurs.    . topiramate (TOPAMAX) 100 MG tablet Take 1 tablet (100 mg total) by mouth at bedtime. 30 tablet 3  . UNABLE TO FIND 1 application as needed. Med Name: Lidocaine cream    . busPIRone (BUSPAR) 10 MG tablet Take one half a tablet twice a day for one week then increase to one whole tablet twice a day. 60 tablet 1   No current facility-administered medications for this visit.    Medical Decision Making:  Established Problem, Stable/Improving (1)  Treatment Plan Summary:Medication management   Major depressive disorder, recurrent moderate we will continue Prozac to 60 mg daily. Continue Abilify 2 mg in the morning. We will increase Wellbutrin XL from 300 mg daily to 450 mg in the morning.  Generalized anxieties order-Prozac as above. No longer getting alprazolam as her pain doctor  is using Valium. We are going to start some BuSpar 5 mg twice a day for 1 week and then she'll go to 10 mg twice daily. Risk and benefits have been discussing patient's able to consent.  Patient will follow up in 1 month. She's been encouraged call any questions or concerns prior to her next appointment.patient is aware of my departure in February and that she will be able to transition to another provider within this clinic.    Faith Rogue 11/21/2015, 10:13 AM

## 2015-11-27 ENCOUNTER — Other Ambulatory Visit: Payer: Medicaid Other

## 2015-12-05 ENCOUNTER — Ambulatory Visit (INDEPENDENT_AMBULATORY_CARE_PROVIDER_SITE_OTHER): Payer: Medicaid Other | Admitting: Licensed Clinical Social Worker

## 2015-12-05 DIAGNOSIS — F331 Major depressive disorder, recurrent, moderate: Secondary | ICD-10-CM | POA: Diagnosis not present

## 2015-12-06 ENCOUNTER — Encounter: Payer: Self-pay | Admitting: Internal Medicine

## 2015-12-06 DIAGNOSIS — M25551 Pain in right hip: Secondary | ICD-10-CM

## 2015-12-06 NOTE — Progress Notes (Signed)
   THERAPIST PROGRESS NOTE  Session Time: 43min  Participation Level: Active  Behavioral Response: CasualAlertDepressed  Type of Therapy: Individual Therapy  Treatment Goals addressed: Diagnosis: Depression  Interventions: CBT, Motivational Interviewing, Solution Focused, Supportive, Family Systems and Reframing  Summary: Kathleen Everett is a 45 y.o. female who presents with continued symptoms of her diagnosis.  Patient was able to list her current stressors and discuss how she has been managing her symptoms.  Discussion of her current relationship with her husband and the role of her children since she is unable to stand for long periods of time.  Discussion of her diagnosis and the current symptoms that she is displaying.  Patient reports that she has been coping with her symptoms by spending money.  Discussion of positive and negative coping skills.   Suicidal/Homicidal: Nowithout intent/plan  Therapist Response: LCSW provided Patient with ongoing emotional support and encouragement.  Normalized her feelings.  Commended Patient on her progress and reinforced the importance of client staying focused on her own strengths and resources and resiliency. Processed various strategies for dealing with stressors.    Plan: Return again in 2 weeks.  Diagnosis: Axis I: Depression    Axis II: No diagnosis    Lubertha South 11/21/2015

## 2015-12-07 NOTE — Telephone Encounter (Signed)
Order placed for referral to ortho 

## 2015-12-12 NOTE — Progress Notes (Signed)
   THERAPIST PROGRESS NOTE  Session Time: 25min  Participation Level: Active  Behavioral Response: CasualAlertDepressed  Type of Therapy: Individual Therapy  Treatment Goals addressed: Coping  Interventions: CBT, Motivational Interviewing, Solution Focused, Supportive, Family Systems and Reframing  Summary: Kathleen Everett is a 45 y.o. female who presents with continued symptoms of her diagnosis.  Patient reviewed symptoms that were present for the past two weeks.  Patient continues to spend money without informing her husband.  Patient continues to change the subject when confronted about behavior.  She states, "he smokes cigarettes" while discussing overspending.  [name] reports that she continues to have a strained relationship with her husband.  She states that they have no intimacy.  Patient states that she does not want to develop or work on alternate coping skills to her depression.  Suicidal/Homicidal: Nowithout intent/plan  Therapist Response: LCSW provided Patient with ongoing emotional support and encouragement.  Normalized her feelings.  Commended Patient on her progress and reinforced the importance of client staying focused on her own strengths and resources and resiliency. Processed various strategies for dealing with stressors.    Plan: Return again in 2weeks.  Diagnosis: Axis I: Depression    Axis II: No diagnosis    Lubertha South 12/12/2015

## 2015-12-18 ENCOUNTER — Ambulatory Visit (INDEPENDENT_AMBULATORY_CARE_PROVIDER_SITE_OTHER): Payer: Medicaid Other | Admitting: Adult Health

## 2015-12-18 ENCOUNTER — Encounter: Payer: Self-pay | Admitting: Adult Health

## 2015-12-18 VITALS — BP 114/68 | HR 83 | Ht 71.0 in | Wt 266.0 lb

## 2015-12-18 DIAGNOSIS — R519 Headache, unspecified: Secondary | ICD-10-CM

## 2015-12-18 DIAGNOSIS — R51 Headache: Secondary | ICD-10-CM

## 2015-12-18 MED ORDER — TOPIRAMATE 100 MG PO TABS: 150.0000 mg | ORAL_TABLET | Freq: Every day | ORAL | Status: DC

## 2015-12-18 NOTE — Progress Notes (Signed)
I have read the note, and I agree with the clinical assessment and plan.  Kathleen Everett   
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
## 2015-12-18 NOTE — Patient Instructions (Signed)
Increase Topamax to 1.5 tablets (150 mg) at bedtime If your symptoms worsen or you develop new symptoms please let us know.  In the future we can try tizanidine if no benefit with topamax

## 2015-12-19 ENCOUNTER — Encounter: Payer: Self-pay | Admitting: Psychiatry

## 2015-12-19 ENCOUNTER — Ambulatory Visit (INDEPENDENT_AMBULATORY_CARE_PROVIDER_SITE_OTHER): Payer: Medicaid Other | Admitting: Psychiatry

## 2015-12-19 ENCOUNTER — Ambulatory Visit (INDEPENDENT_AMBULATORY_CARE_PROVIDER_SITE_OTHER): Payer: Medicaid Other | Admitting: Licensed Clinical Social Worker

## 2015-12-19 VITALS — BP 118/72 | HR 84 | Temp 97.3°F | Ht 71.0 in | Wt 265.4 lb

## 2015-12-19 DIAGNOSIS — F411 Generalized anxiety disorder: Secondary | ICD-10-CM

## 2015-12-19 DIAGNOSIS — F332 Major depressive disorder, recurrent severe without psychotic features: Secondary | ICD-10-CM

## 2015-12-19 DIAGNOSIS — F99 Mental disorder, not otherwise specified: Secondary | ICD-10-CM | POA: Diagnosis not present

## 2015-12-19 DIAGNOSIS — R419 Unspecified symptoms and signs involving cognitive functions and awareness: Secondary | ICD-10-CM

## 2015-12-19 MED ORDER — BUPROPION HCL ER (XL) 450 MG PO TB24: 450.0000 mg | ORAL_TABLET | ORAL | Status: DC

## 2015-12-19 MED ORDER — HYDROXYZINE PAMOATE 25 MG PO CAPS: ORAL_CAPSULE | ORAL | Status: DC

## 2015-12-19 NOTE — Progress Notes (Signed)
BH MD/PA/NP OP Progress Note  12/19/2015 11:23 AM Kathleen Everett  MRN:  [id number]  Subjective:  Patient returns a follow-up of her major depressive disorder, severe and generalized anxiety disorder.  Patient states she continues to have anxiety. The last visit we added BuSpar and increased her Wellbutrin XL to 450 mg. She states she really does not notice any difference with these medications. She does state that the BuSpar may have caused her to feel little bit sleepy and that she had to avoid taking it simultaneously with medications prescribed by her other physician such as Valium.  He describes anxiety and racing thoughts during the anxious periods. She also feels like she has a lot going on in her interpersonal life. When asked about what she thought her main issue was she felt it was "anxiety." Asian also discusses that she stress eats. She is not seen to indicate it's binging but consideration to Vyvanse may be an option given her related cognitive issues (i.e., memory problems). Chief Complaint: anxiety Chief Complaint    Follow-up; Medication Refill; Anxiety; Panic Attack; Depression; Stress; Fatigue; Hallucinations     Visit Diagnosis:     ICD-9-CM ICD-10-CM   1. Major depressive disorder, recurrent, severe without psychotic features ([place]) 296.33 F33.2   2. Generalized anxiety disorder 300.02 F41.1   3. Neurocognitive disorder, unspecified 294.9 F99      Past Medical History:  Past Medical History  Diagnosis Date  . Hyperlipidemia   . Chronic tension headaches   . Dysmenorrhea   . Hidradenitis   . Endometriosis   . Depression   . Osteoarthritis   . Obesity   . Ovarian cyst     hx  . GERD (gastroesophageal reflux disease)   . Chiari malformation     s/p sgy decompression  . Back pain   . Gas pain   . Vitamin B12 deficiency     On injections    Past Surgical History  Procedure Laterality Date  . Cystoscopy      bladder ulcers  . Cesarean section     1999/2004 twins  . Temporomandibular joint surgery  2000  . Ovarian cyst removal  2001    right  . Breast biopsy      fibroadenoma  . Hernia repair  2003  . Tubal ligation      Bilateral  . Abdominal hysterectomy  03/27/07    laparoscopic  . Chiari decompression    . Esophagogastroduodenoscopy N/A 04/28/2015    Procedure: ESOPHAGOGASTRODUODENOSCOPY (EGD);  Surgeon: Manya Silvas, MD;  Location: [facility] ENDOSCOPY;  Service: Endoscopy;  Laterality: N/A;  . Colonoscopy with propofol N/A 04/28/2015    Procedure: COLONOSCOPY WITH PROPOFOL;  Surgeon: Manya Silvas, MD;  Location: [facility] ENDOSCOPY;  Service: Endoscopy;  Laterality: N/A;   Family History:  Family History  Problem Relation Age of Onset  . Hyperlipidemia Mother   . Anxiety disorder Mother   . Atrial fibrillation Mother   . Hypertension Mother   . Anxiety disorder Father   . Stroke Father   . Hyperlipidemia Father   . Hypertension Father   . Congestive Heart Failure Father   . Depression Father   . Breast cancer Neg Hx   . Colon cancer Paternal Aunt   . Hyperlipidemia Brother   . Dementia Maternal Grandmother   . Dementia Paternal Grandmother    Social History:  Social History   Social History  . Marital Status: Married    Spouse Name: N/A  . Number  of Children: 3  . Years of Education: 12   Social History Main Topics  . Smoking status: Never Smoker   . Smokeless tobacco: Never Used  . Alcohol Use: 0.0 oz/week    0 Standard drinks or equivalent per week     Comment: rarely  . Drug Use: No  . Sexual Activity: Yes    Birth Control/ Protection: None   Other Topics Concern  . None   Social History Narrative   Patient drinks 2-3 cups of caffeine daily.   Patient is right handed.    Additional History:   Assessment:   Musculoskeletal: Strength & Muscle Tone: within normal limits Gait & Station: normal Patient leans: N/A  Psychiatric Specialty Exam: Anxiety Symptoms include nervous/anxious  behavior. Patient reports no insomnia or suicidal ideas.    Depression        Associated symptoms include does not have insomnia and no suicidal ideas.  Past medical history includes anxiety.     Review of Systems  Psychiatric/Behavioral: Positive for depression and memory loss. Negative for suicidal ideas, hallucinations and substance abuse. The patient is nervous/anxious. The patient does not have insomnia.   All other systems reviewed and are negative.   Blood pressure 118/72, pulse 84, temperature 97.3 F (36.3 C), temperature source Tympanic, height 5\' 11"  (1.803 m), weight 265 lb 5.8 oz (120.367 kg), last menstrual period 12/08/2006, SpO2 97 %.Body mass index is 37.03 kg/(m^2).  General Appearance: Well Groomed  Eye Contact:  Good  Speech:  Clear and Coherent  Volume:  Normal  Mood:  anxious  Affect:  still constricted but more reactive than in past visits  Thought Process:  Linear  Orientation:  Full (Time, Place, and Person)  Thought Content:  Negative  Suicidal Thoughts:  No  Homicidal Thoughts:  No  Memory:  Immediate;   Good Recent;   Fair Remote;   Poor  Judgement:  Good  Insight:  Good  Psychomotor Activity:  Normal  Concentration:  Good  Recall:  Poor  Fund of Knowledge: Good  Language: Good  Akathisia:  Negative  Handed:  Right unknown  AIMS (if indicated):  Done today, to be scanned to chart  Assets:  Communication Skills Desire for Improvement  ADL's:  Intact  Cognition: WNL  Sleep:  As above, fair   Is the patient at risk to self?  No. Has the patient been a risk to self in the past 6 months?  No. Has the patient been a risk to self within the distant past?  No. Is the patient a risk to others?  No. Has the patient been a risk to others in the past 6 months?  No. Has the patient been a risk to others within the distant past?  No.  Current Medications: Current Outpatient Prescriptions  Medication Sig Dispense Refill  . ARIPiprazole (ABILIFY) 2 MG  tablet Take 1 tablet (2 mg total) by mouth every morning. 30 tablet 3  . atorvastatin (LIPITOR) 10 MG tablet Take 1 tablet (10 mg total) by mouth daily. 30 tablet 2  . BuPROPion HCl ER, XL, 450 MG TB24 Take 450 mg by mouth every morning. 30 tablet 3  . cholecalciferol (VITAMIN D) 1000 UNITS tablet Take 2,000 Units by mouth daily.    . cyanocobalamin (,VITAMIN B-12,) 1000 MCG/ML injection INJECT ONE ML (CC) INTRAMUSCULARLY ONCE EVERY MONTH 30 mL 2  . diazepam (VALIUM) 5 MG tablet Take 5 mg by mouth every 6 (six) hours as needed for anxiety.    Marland Kitchen  esomeprazole (NEXIUM) 40 MG capsule Take 1 capsule (40 mg total) by mouth 2 (two) times daily before a meal. 60 capsule 5  . FLUoxetine (PROZAC) 20 MG capsule Take 3 capsules (60 mg total) by mouth daily. 90 capsule 3  . furosemide (LASIX) 20 MG tablet Take 1 tablet (20 mg total) by mouth daily as needed for edema. 30 tablet 6  . Magnesium Oxide 400 (240 MG) MG TABS One tablet daily 30 tablet 1  . Nutritional Supplements (JUICE PLUS FIBRE PO) Take by mouth.    . Oxycodone HCl 10 MG TABS Take 10 mg by mouth 4 (four) times daily.  0  . potassium chloride (K-DUR) 10 MEQ tablet Take 1 tablet (10 mEq total) by mouth daily as needed (with lasix). 30 tablet 6  . SUMAtriptan (IMITREX) 50 MG tablet Take 50 mg by mouth as needed for migraine or headache. May repeat in 2 hours if headache persists or recurs.    . topiramate (TOPAMAX) 100 MG tablet Take 1.5 tablets (150 mg total) by mouth at bedtime. 45 tablet 5  . UNABLE TO FIND 1 application as needed. Med Name: Lidocaine cream    . hydrOXYzine (VISTARIL) 25 MG capsule Take one to two tablets three times a day as needed for anxiety. 180 capsule 3  . mupirocin ointment (BACTROBAN) 2 % Place 1 application into the nose 2 (two) times daily. (Patient not taking: Reported on 12/19/2015) 22 g 0   No current facility-administered medications for this visit.    Medical Decision Making:  Established Problem,  Stable/Improving (1)  Treatment Plan Summary:Medication management   Major depressive disorder, recurrent moderate we will continue Prozac to 60 mg daily. Continue Abilify 2 mg in the morning and Wellbutrin XL 450 mg in the morning.  Generalized anxieties order-Prozac as above. No longer getting alprazolam as her pain doctor is using Valium. In ago and discontinue BuSpar as trying to increase the dose might also worsen her complaints of sedation from the medicine. We will start some Vistaril 25-50 mg 3 times a day as needed for anxiety.  Patient will follow up in 1 month. She's been encouraged call any questions or concerns prior to her next appointment.patient is aware of my departure in February and that she will be able to transition to another provider within this clinic.    Faith Rogue 12/19/2015, 11:23 AM

## 2015-12-23 ENCOUNTER — Other Ambulatory Visit: Payer: Self-pay | Admitting: Internal Medicine

## 2016-01-03 ENCOUNTER — Ambulatory Visit: Payer: Medicaid Other | Admitting: Licensed Clinical Social Worker

## 2016-01-03 NOTE — Progress Notes (Signed)
   THERAPIST PROGRESS NOTE  Session Time: 31min  Participation Level: Active  Behavioral Response: CasualAlertDepressed  Type of Therapy: Individual Therapy  Treatment Goals addressed: Coping and Diagnosis: Depression  Interventions: CBT, Motivational Interviewing, Solution Focused, Supportive and Reframing  Summary: Kathleen Everett is a 45 y.o. female.  Therapist assisted Patient with discussing her symptoms since the previous session.  Therapist actively listened as Patient listed her current stressors. Therapist transitioned to discuss interpersonal functioning. Therapist learned that [name] continues to struggle with her anger.  Therapist facilitated open discussion with [name] on the importance of socializing with positive peers versus negative peers. Therapist asked [name] to make a list of negative consequences associated with interacting with negative peers.  Therapist taught [name] the importance of thinking about the consequences of her actions before reacting on impulse. Therapist taught the [name] the impulse control skill of "stop, look, listen, and plan" before acting. Therapist utilized a cognitive behavioral therapy approach to allow [name] to understand how thoughts and feelings affect behavior. Therapist encouraged [name] to recognize "trigger points" for anger and utilize deep breathing exercises to act as a calming agent when faced with stressful situations. Therapist encouraged [name] to always walk away from a stressful situation if she feels overwhelmed by the situation and return when anger has subsided.   Suicidal/Homicidal: Nowithout intent/plan  Therapist Response: LCSW provided Patient with ongoing emotional support and encouragement.  Normalized her feelings.  Commended Patient on her progress and reinforced the importance of client staying focused on her own strengths and resources and resiliency. Processed various strategies for dealing with stressors.    Plan:  Return again in 2 weeks.  Diagnosis: Axis I: Major Depression, Recurrent severe    Axis II: No diagnosis    Lubertha South 12/20/2015

## 2016-01-16 ENCOUNTER — Ambulatory Visit: Payer: Medicaid Other | Admitting: Psychiatry

## 2016-01-16 ENCOUNTER — Ambulatory Visit (INDEPENDENT_AMBULATORY_CARE_PROVIDER_SITE_OTHER): Payer: Medicaid Other | Admitting: Psychiatry

## 2016-01-16 ENCOUNTER — Encounter: Payer: Self-pay | Admitting: Psychiatry

## 2016-01-16 DIAGNOSIS — F331 Major depressive disorder, recurrent, moderate: Secondary | ICD-10-CM

## 2016-01-16 DIAGNOSIS — F411 Generalized anxiety disorder: Secondary | ICD-10-CM | POA: Diagnosis not present

## 2016-01-16 MED ORDER — FLUOXETINE HCL 20 MG PO CAPS: 80.0000 mg | ORAL_CAPSULE | Freq: Every day | ORAL | Status: DC

## 2016-01-16 NOTE — Progress Notes (Signed)
Patient ID: Kathleen Everett, female   DOB: Apr 14, 1971, 45 y.o.   MRN: [id number] [facility] MD/PA/NP OP Progress Note  01/16/2016 11:05 AM Quierra Woodis  MRN:  [id number]  Subjective:  Patient returns a follow-up of her major depressive disorder, severe and generalized anxiety disorder. Patient was previously seen by Dr.williams and this is the first visit for this patient with this clinician. States she has not been taking the Vistaril, made her sleepy. States she feels very emotional, crying more. States she has a lot of stress in her life. Feels like she is not living upto her expectations. She is limited by her back pain. Patient states she continues to have high anxiety.  States she has felt the Prozac has been most helpful. Has not seen any benefit with the Bupropion. Not sleeping well. States she slept for 3 hours last night. Reports she has been feeling out of it. Denies any suicidal thoughts.   Chief Complaint: anxiety Chief Complaint    Follow-up; Medication Refill     Visit Diagnosis:   No diagnosis found.   Past Medical History:  Past Medical History  Diagnosis Date  . Hyperlipidemia   . Chronic tension headaches   . Dysmenorrhea   . Hidradenitis   . Endometriosis   . Depression   . Osteoarthritis   . Obesity   . Ovarian cyst     hx  . GERD (gastroesophageal reflux disease)   . Chiari malformation     s/p sgy decompression  . Back pain   . Gas pain   . Vitamin B12 deficiency     On injections    Past Surgical History  Procedure Laterality Date  . Cystoscopy      bladder ulcers  . Cesarean section      1999/2004 twins  . Temporomandibular joint surgery  2000  . Ovarian cyst removal  2001    right  . Breast biopsy      fibroadenoma  . Hernia repair  2003  . Tubal ligation      Bilateral  . Abdominal hysterectomy  03/27/07    laparoscopic  . Chiari decompression    . Esophagogastroduodenoscopy N/A 04/28/2015    Procedure: ESOPHAGOGASTRODUODENOSCOPY  (EGD);  Surgeon: Manya Silvas, MD;  Location: [facility] ENDOSCOPY;  Service: Endoscopy;  Laterality: N/A;  . Colonoscopy with propofol N/A 04/28/2015    Procedure: COLONOSCOPY WITH PROPOFOL;  Surgeon: Manya Silvas, MD;  Location: [facility] ENDOSCOPY;  Service: Endoscopy;  Laterality: N/A;   Family History:  Family History  Problem Relation Age of Onset  . Hyperlipidemia Mother   . Anxiety disorder Mother   . Atrial fibrillation Mother   . Hypertension Mother   . Anxiety disorder Father   . Stroke Father   . Hyperlipidemia Father   . Hypertension Father   . Congestive Heart Failure Father   . Depression Father   . Breast cancer Neg Hx   . Colon cancer Paternal Aunt   . Hyperlipidemia Brother   . Dementia Maternal Grandmother   . Dementia Paternal Grandmother    Social History:  Social History   Social History  . Marital Status: Married    Spouse Name: N/A  . Number of Children: 3  . Years of Education: 70   Social History Main Topics  . Smoking status: Never Smoker   . Smokeless tobacco: Never Used  . Alcohol Use: No     Comment: rarely  . Drug Use: No  . Sexual Activity:  Yes    Birth Control/ Protection: None   Other Topics Concern  . None   Social History Narrative   Patient drinks 2-3 cups of caffeine daily.   Patient is right handed.    Additional History:   Assessment:   Musculoskeletal: Strength & Muscle Tone: within normal limits Gait & Station: normal Patient leans: N/A  Psychiatric Specialty Exam: Anxiety Symptoms include nervous/anxious behavior. Patient reports no insomnia or suicidal ideas.    Depression        Associated symptoms include does not have insomnia and no suicidal ideas.  Past medical history includes anxiety.     Review of Systems  Psychiatric/Behavioral: Positive for depression and memory loss. Negative for suicidal ideas, hallucinations and substance abuse. The patient is nervous/anxious. The patient does not have insomnia.    All other systems reviewed and are negative.   Blood pressure 118/82, pulse 85, temperature 98.1 F (36.7 C), temperature source Tympanic, height 5\' 11"  (1.803 m), weight 273 lb 6.4 oz (124.013 kg), last menstrual period 12/08/2006, SpO2 99 %.Body mass index is 38.15 kg/(m^2).  General Appearance: Well Groomed  Eye Contact:  Good  Speech:  Clear and Coherent  Volume:  Normal  Mood:  anxious  Affect:  anxious  Thought Process:  Linear  Orientation:  Full (Time, Place, and Person)  Thought Content:  Negative  Suicidal Thoughts:  No  Homicidal Thoughts:  No  Memory:  Immediate;   Good Recent;   Fair Remote;   Poor  Judgement:  Good  Insight:  Good  Psychomotor Activity:  Normal  Concentration:  Good  Recall:  Poor  Fund of Knowledge: Good  Language: Good  Akathisia:  Negative  Handed:  Right unknown  AIMS (if indicated):    Assets:  Communication Skills Desire for Improvement  ADL's:  Intact  Cognition: WNL  Sleep:  As above, fair   Is the patient at risk to self?  No. Has the patient been a risk to self in the past 6 months?  No. Has the patient been a risk to self within the distant past?  No. Is the patient a risk to others?  No. Has the patient been a risk to others in the past 6 months?  No. Has the patient been a risk to others within the distant past?  No.  Current Medications: Current Outpatient Prescriptions  Medication Sig Dispense Refill  . ARIPiprazole (ABILIFY) 2 MG tablet Take 1 tablet (2 mg total) by mouth every morning. 30 tablet 3  . atorvastatin (LIPITOR) 10 MG tablet Take 1 tablet (10 mg total) by mouth daily. 30 tablet 2  . BuPROPion HCl ER, XL, 450 MG TB24 Take 450 mg by mouth every morning. 30 tablet 3  . cholecalciferol (VITAMIN D) 1000 UNITS tablet Take 2,000 Units by mouth daily.    . CRESTOR 5 MG tablet TAKE ONE TABLET BY MOUTH ONCE DAILY 30 tablet 5  . cyanocobalamin (,VITAMIN B-12,) 1000 MCG/ML injection INJECT ONE ML (CC) INTRAMUSCULARLY  ONCE EVERY MONTH 30 mL 2  . diazepam (VALIUM) 5 MG tablet Take 5 mg by mouth every 6 (six) hours as needed for anxiety.    Marland Kitchen esomeprazole (NEXIUM) 40 MG capsule Take 1 capsule (40 mg total) by mouth 2 (two) times daily before a meal. 60 capsule 5  . FLUoxetine (PROZAC) 20 MG capsule Take 3 capsules (60 mg total) by mouth daily. 90 capsule 3  . furosemide (LASIX) 20 MG tablet Take 1 tablet (20 mg  total) by mouth daily as needed for edema. 30 tablet 6  . Magnesium Oxide 400 (240 MG) MG TABS One tablet daily 30 tablet 1  . mupirocin ointment (BACTROBAN) 2 % Place 1 application into the nose 2 (two) times daily. 22 g 0  . Nutritional Supplements (JUICE PLUS FIBRE PO) Take by mouth.    . Oxycodone HCl 10 MG TABS Take 10 mg by mouth 4 (four) times daily.  0  . potassium chloride (K-DUR) 10 MEQ tablet Take 1 tablet (10 mEq total) by mouth daily as needed (with lasix). 30 tablet 6  . SUMAtriptan (IMITREX) 50 MG tablet Take 50 mg by mouth as needed for migraine or headache. May repeat in 2 hours if headache persists or recurs.    . topiramate (TOPAMAX) 100 MG tablet Take 1.5 tablets (150 mg total) by mouth at bedtime. 45 tablet 5  . UNABLE TO FIND 1 application as needed. Med Name: Lidocaine cream     No current facility-administered medications for this visit.    Medical Decision Making:  Established Problem, Stable/Improving (1)  Treatment Plan Summary:Medication management   Major depressive disorder, recurrent moderate  Increase Prozac to 80 mg daily. Continue Abilify 2 mg in the morning  Decrease Wellbutrin XL to 300 mg in the morning for 1 weeks , then decrease to 150mg  po qd for 2 weeks and then discontinue.  Generalized anxieties order- Prozac as above.    Patient will follow up in 1 month. She's been encouraged call any questions or concerns prior to next appointment.    Ketsia Linebaugh 01/16/2016, 11:05 AM

## 2016-01-17 DIAGNOSIS — M1611 Unilateral primary osteoarthritis, right hip: Secondary | ICD-10-CM

## 2016-01-17 HISTORY — DX: Unilateral primary osteoarthritis, right hip: M16.11

## 2016-01-18 ENCOUNTER — Encounter: Payer: Self-pay | Admitting: Internal Medicine

## 2016-01-18 ENCOUNTER — Ambulatory Visit (INDEPENDENT_AMBULATORY_CARE_PROVIDER_SITE_OTHER): Payer: Medicaid Other | Admitting: Internal Medicine

## 2016-01-18 VITALS — BP 110/80 | HR 83 | Temp 98.2°F | Resp 18 | Ht 71.0 in | Wt 270.0 lb

## 2016-01-18 DIAGNOSIS — Z1239 Encounter for other screening for malignant neoplasm of breast: Secondary | ICD-10-CM

## 2016-01-18 DIAGNOSIS — G43809 Other migraine, not intractable, without status migrainosus: Secondary | ICD-10-CM

## 2016-01-18 DIAGNOSIS — M549 Dorsalgia, unspecified: Secondary | ICD-10-CM

## 2016-01-18 DIAGNOSIS — H9190 Unspecified hearing loss, unspecified ear: Secondary | ICD-10-CM | POA: Insufficient documentation

## 2016-01-18 DIAGNOSIS — Z658 Other specified problems related to psychosocial circumstances: Secondary | ICD-10-CM

## 2016-01-18 DIAGNOSIS — F411 Generalized anxiety disorder: Secondary | ICD-10-CM

## 2016-01-18 DIAGNOSIS — F331 Major depressive disorder, recurrent, moderate: Secondary | ICD-10-CM

## 2016-01-18 DIAGNOSIS — E7800 Pure hypercholesterolemia, unspecified: Secondary | ICD-10-CM

## 2016-01-18 DIAGNOSIS — K219 Gastro-esophageal reflux disease without esophagitis: Secondary | ICD-10-CM

## 2016-01-18 DIAGNOSIS — E669 Obesity, unspecified: Secondary | ICD-10-CM

## 2016-01-18 DIAGNOSIS — R911 Solitary pulmonary nodule: Secondary | ICD-10-CM

## 2016-01-18 DIAGNOSIS — G8929 Other chronic pain: Secondary | ICD-10-CM

## 2016-01-18 DIAGNOSIS — F439 Reaction to severe stress, unspecified: Secondary | ICD-10-CM

## 2016-01-18 DIAGNOSIS — G4733 Obstructive sleep apnea (adult) (pediatric): Secondary | ICD-10-CM | POA: Diagnosis not present

## 2016-01-18 DIAGNOSIS — Q0700 Arnold-Chiari syndrome without spina bifida or hydrocephalus: Secondary | ICD-10-CM

## 2016-01-18 NOTE — Progress Notes (Signed)
Pre-visit discussion using our clinic review tool. No additional management support is needed unless otherwise documented below in the visit note.  

## 2016-01-18 NOTE — Progress Notes (Signed)
Patient ID: Kathleen Everett, female   DOB: 07-Jun-1971, 45 y.o.   MRN: [id number]   Subjective:    Patient ID: Kathleen Everett, female    DOB: 23-Jan-1971, 46 y.o.   MRN: [id number]  HPI  Patient with past history of hypercholesterolemia, chronic back pain, depression and history of chiari malformation.  She comes in today for a scheduled follow up.  Increased stress and depression.  Followed by behavioral health.  Recently increased prozac to 80mg  q day.  Weaning off wellbutrin.  Continues on abilify.  Trying to exercise some.  She was inquiring about weight loss medication.  She is not watching her diet and not exercising.  Would prefer to hold on diet medication.  Discussed exercise and diet adjustment.  She reports decreased hearing.  Request evaluation.  No chest pain or tightness.  No sob.  No cough or congestion.  No abdominal pain.  Bowels stable.  Still with chronic back pain and increased right hip pain.  S/p injection from Dr Sharlet Salina yesterday.  Seeing neurology.  Increased topamax to help with headaches.     Past Medical History  Diagnosis Date  . Hyperlipidemia   . Chronic tension headaches   . Dysmenorrhea   . Hidradenitis   . Endometriosis   . Depression   . Osteoarthritis   . Obesity   . Ovarian cyst     hx  . GERD (gastroesophageal reflux disease)   . Chiari malformation     s/p sgy decompression  . Back pain   . Gas pain   . Vitamin B12 deficiency     On injections   Past Surgical History  Procedure Laterality Date  . Cystoscopy      bladder ulcers  . Cesarean section      1999/2004 twins  . Temporomandibular joint surgery  2000  . Ovarian cyst removal  2001    right  . Breast biopsy      fibroadenoma  . Hernia repair  2003  . Tubal ligation      Bilateral  . Abdominal hysterectomy  03/27/07    laparoscopic  . Chiari decompression    . Esophagogastroduodenoscopy N/A 04/28/2015    Procedure: ESOPHAGOGASTRODUODENOSCOPY (EGD);  Surgeon: Manya Silvas, MD;  Location[facility] ENDOSCOPY;  Service: Endoscopy;  Laterality: N/A;  . Colonoscopy with propofol N/A 04/28/2015    Procedure: COLONOSCOPY WITH PROPOFOL;  Surgeon: Manya Silvas, MD;  Location: [facility] ENDOSCOPY;  Service: Endoscopy;  Laterality: N/A;   Family History  Problem Relation Age of Onset  . Hyperlipidemia Mother   . Anxiety disorder Mother   . Atrial fibrillation Mother   . Hypertension Mother   . Anxiety disorder Father   . Stroke Father   . Hyperlipidemia Father   . Hypertension Father   . Congestive Heart Failure Father   . Depression Father   . Breast cancer Neg Hx   . Colon cancer Paternal Aunt   . Hyperlipidemia Brother   . Dementia Maternal Grandmother   . Dementia Paternal Grandmother    Social History   Social History  . Marital Status: Married    Spouse Name: N/A  . Number of Children: 3  . Years of Education: 68   Social History Main Topics  . Smoking status: Never Smoker   . Smokeless tobacco: Never Used  . Alcohol Use: No     Comment: rarely  . Drug Use: No  . Sexual Activity: Yes    Birth Control/ Protection:  None   Other Topics Concern  . None   Social History Narrative   Patient drinks 2-3 cups of caffeine daily.   Patient is right handed.     Outpatient Encounter Prescriptions as of 01/18/2016  Medication Sig  . ARIPiprazole (ABILIFY) 2 MG tablet Take 1 tablet (2 mg total) by mouth every morning.  Marland Kitchen atorvastatin (LIPITOR) 10 MG tablet Take 1 tablet (10 mg total) by mouth daily.  . BuPROPion HCl ER, XL, 450 MG TB24 Take 450 mg by mouth every morning.  . cholecalciferol (VITAMIN D) 1000 UNITS tablet Take 2,000 Units by mouth daily.  . CRESTOR 5 MG tablet TAKE ONE TABLET BY MOUTH ONCE DAILY  . cyanocobalamin (,VITAMIN B-12,) 1000 MCG/ML injection INJECT ONE ML (CC) INTRAMUSCULARLY ONCE EVERY MONTH  . diazepam (VALIUM) 5 MG tablet Take 5 mg by mouth every 6 (six) hours as needed for anxiety.  . diclofenac sodium (VOLTAREN) 1 % GEL  Apply topically 4 (four) times daily.  Marland Kitchen esomeprazole (NEXIUM) 40 MG capsule Take 1 capsule (40 mg total) by mouth 2 (two) times daily before a meal.  . FLUoxetine (PROZAC) 20 MG capsule Take 4 capsules (80 mg total) by mouth daily.  . furosemide (LASIX) 20 MG tablet Take 1 tablet (20 mg total) by mouth daily as needed for edema.  . Magnesium Oxide 400 (240 MG) MG TABS One tablet daily  . mupirocin ointment (BACTROBAN) 2 % Place 1 application into the nose 2 (two) times daily.  . Nutritional Supplements (JUICE PLUS FIBRE PO) Take by mouth.  . Oxycodone HCl 10 MG TABS Take 10 mg by mouth 4 (four) times daily.  . potassium chloride (K-DUR) 10 MEQ tablet Take 1 tablet (10 mEq total) by mouth daily as needed (with lasix).  . SUMAtriptan (IMITREX) 50 MG tablet Take 50 mg by mouth as needed for migraine or headache. May repeat in 2 hours if headache persists or recurs.  . topiramate (TOPAMAX) 100 MG tablet Take 1.5 tablets (150 mg total) by mouth at bedtime.  Marland Kitchen UNABLE TO FIND 1 application as needed. Med Name: Lidocaine cream   No facility-administered encounter medications on file as of 01/18/2016.    Review of Systems  Constitutional: Positive for appetite change.       Weight gain.   HENT: Negative for congestion and sinus pressure.   Respiratory: Negative for cough, chest tightness and shortness of breath.   Cardiovascular: Negative for chest pain and palpitations.       Describes some swelling in her left foot.  No injury or trauma.  Both feet swell.  Left is worse.  Better in am.  Worse at end of day.    Gastrointestinal: Negative for nausea, vomiting and abdominal pain.  Genitourinary: Negative for dysuria and difficulty urinating.  Musculoskeletal: Positive for back pain.       Right hip pain as outlined.   Skin: Negative for color change and rash.  Neurological: Positive for headaches. Negative for dizziness.  Psychiatric/Behavioral: Negative for agitation.       Increased stress as  outlined.        Objective:     Blood pressure rechecked by me:  114/72  Physical Exam  Constitutional: She appears well-developed and well-nourished. No distress.  HENT:  Nose: Nose normal.  Mouth/Throat: Oropharynx is clear and moist.  Neck: Neck supple. No thyromegaly present.  Cardiovascular: Normal rate and regular rhythm.   Pulmonary/Chest: Breath sounds normal. No respiratory distress. She has no wheezes.  Abdominal: Soft. Bowel sounds are normal. There is no tenderness.  Musculoskeletal: She exhibits no edema or tenderness.  Lymphadenopathy:    She has no cervical adenopathy.  Skin: No rash noted. No erythema.  Psychiatric: She has a normal mood and affect. Her behavior is normal.    BP 110/80 mmHg  Pulse 83  Temp(Src) 98.2 F (36.8 C) (Oral)  Resp 18  Ht 5\' 11"  (1.803 m)  Wt 270 lb (122.471 kg)  BMI 37.67 kg/m2  SpO2 96%  LMP 12/08/2006 Wt Readings from Last 3 Encounters:  01/18/16 270 lb (122.471 kg)  01/16/16 273 lb 6.4 oz (124.013 kg)  12/19/15 265 lb 5.8 oz (120.367 kg)     Lab Results  Component Value Date   WBC 5.6 10/17/2014   HGB 12.3 10/17/2014   HCT 37.4 10/17/2014   PLT 219.0 10/17/2014   GLUCOSE 88 06/30/2015   CHOL 281* 06/30/2015   TRIG 300.0* 06/30/2015   HDL 41.40 06/30/2015   LDLDIRECT 185.0 06/30/2015   LDLCALC 110* 02/21/2015   ALT 24 06/30/2015   AST 21 06/30/2015   NA 138 06/30/2015   K 4.0 06/30/2015   CL 103 06/30/2015   CREATININE 0.81 06/30/2015   BUN 11 06/30/2015   CO2 29 06/30/2015   TSH 2.05 02/21/2015       Assessment & Plan:   Problem List Items Addressed This Visit    Anxiety, generalized    Followed by psychiatry.        Arnold-Chiari malformation (HCC)    Has a history of chiari malformation.  S/p surgery.  Seeing neurology.        Chronic back pain    Persistent chronic pain.  Seeing specialist.        Decreased hearing - Primary    Refer to ENT for evaluation.        Relevant Orders    Ambulatory referral to ENT   Generalized anxiety disorder    Seeing psychiatry.        GERD (gastroesophageal reflux disease)    On nexium.  Has seen GI.       Headache, migraine    Seeing neurology.  On topamax.        Hypercholesterolemia    Low cholesterol diet and exercise.  On lipitor.  Follow lipid panel and liver function tests.        Lung nodule    Previous CT with lung nodule.  Due f/u.  Schedule.        Relevant Orders   CT Chest W Contrast   Major depressive disorder, recurrent episode, moderate ([place])    Followed by psychiatry.  Medications being adjusted.  prozac just increased.        Obesity (BMI 30-39.9)    Diet and exercise.  Follow.       Obstructive apnea    CPAP.       Stress    Seeing psychiatry.  Just increased prozac.  Tapering wellbutrin.  On abilify.  Follow.         Other Visit Diagnoses    Screening breast examination        Relevant Orders    MM DIGITAL SCREENING BILATERAL        [name], MD

## 2016-01-21 ENCOUNTER — Encounter: Payer: Self-pay | Admitting: Internal Medicine

## 2016-01-21 NOTE — Assessment & Plan Note (Signed)
CPAP.  

## 2016-01-21 NOTE — Assessment & Plan Note (Signed)
Previous CT with lung nodule.  Due f/u.  Schedule.

## 2016-01-21 NOTE — Assessment & Plan Note (Signed)
Seeing psychiatry

## 2016-01-21 NOTE — Assessment & Plan Note (Signed)
Seeing neurology.  On topamax.

## 2016-01-21 NOTE — Assessment & Plan Note (Signed)
Has a history of chiari malformation.  S/p surgery.  Seeing neurology.

## 2016-01-21 NOTE — Assessment & Plan Note (Signed)
Seeing psychiatry.  Just increased prozac.  Tapering wellbutrin.  On abilify.  Follow.

## 2016-01-21 NOTE — Assessment & Plan Note (Signed)
Followed by psychiatry.  Medications being adjusted.  prozac just increased.

## 2016-01-21 NOTE — Assessment & Plan Note (Signed)
Persistent chronic pain.  Seeing specialist.

## 2016-01-21 NOTE — Assessment & Plan Note (Signed)
Low cholesterol diet and exercise.  On lipitor.  Follow lipid panel and liver function tests.   

## 2016-01-21 NOTE — Assessment & Plan Note (Signed)
Followed by psychiatry 

## 2016-01-21 NOTE — Assessment & Plan Note (Signed)
Diet and exercise.  Follow.  

## 2016-01-21 NOTE — Assessment & Plan Note (Signed)
Refer to ENT for evaluation 

## 2016-01-21 NOTE — Assessment & Plan Note (Signed)
On nexium.  Has seen GI.  

## 2016-01-22 ENCOUNTER — Other Ambulatory Visit: Payer: Medicaid Other

## 2016-01-22 ENCOUNTER — Ambulatory Visit: Payer: Medicaid Other

## 2016-01-24 ENCOUNTER — Encounter: Payer: Self-pay | Admitting: Internal Medicine

## 2016-01-24 DIAGNOSIS — F329 Major depressive disorder, single episode, unspecified: Secondary | ICD-10-CM

## 2016-01-24 DIAGNOSIS — F32A Depression, unspecified: Secondary | ICD-10-CM

## 2016-01-25 ENCOUNTER — Telehealth: Payer: Self-pay | Admitting: Cardiovascular Disease

## 2016-01-25 NOTE — Telephone Encounter (Signed)
Order placed for neuropsych referral.

## 2016-01-25 NOTE — Telephone Encounter (Signed)
Received records request [employer], forwarded to [employer] for processing.  

## 2016-01-29 ENCOUNTER — Other Ambulatory Visit (INDEPENDENT_AMBULATORY_CARE_PROVIDER_SITE_OTHER): Payer: Medicaid Other

## 2016-01-29 ENCOUNTER — Ambulatory Visit
Admission: RE | Admit: 2016-01-29 | Discharge: 2016-01-29 | Disposition: A | Discharge status: Home / Self Care | Payer: Medicaid Other | Source: Ambulatory Visit | Attending: Internal Medicine | Admitting: Internal Medicine

## 2016-01-29 DIAGNOSIS — G4733 Obstructive sleep apnea (adult) (pediatric): Secondary | ICD-10-CM

## 2016-01-29 DIAGNOSIS — Z1231 Encounter for screening mammogram for malignant neoplasm of breast: Secondary | ICD-10-CM | POA: Diagnosis present

## 2016-01-29 DIAGNOSIS — Z1239 Encounter for other screening for malignant neoplasm of breast: Secondary | ICD-10-CM

## 2016-01-29 DIAGNOSIS — E7800 Pure hypercholesterolemia, unspecified: Secondary | ICD-10-CM | POA: Diagnosis not present

## 2016-01-29 LAB — CBC WITH DIFFERENTIAL/PLATELET
Basophils Absolute: 0 10*3/uL (ref 0.0–0.1)
Basophils Relative: 0.2 % (ref 0.0–3.0)
Eosinophils Absolute: 0.2 10*3/uL (ref 0.0–0.7)
Eosinophils Relative: 2.7 % (ref 0.0–5.0)
HCT: 37.5 % (ref 36.0–46.0)
Hemoglobin: 12.4 g/dL (ref 12.0–15.0)
Lymphocytes Relative: 18.6 % (ref 12.0–46.0)
Lymphs Abs: 1.5 10*3/uL (ref 0.7–4.0)
MCHC: 33.1 g/dL (ref 30.0–36.0)
MCV: 83.5 fl (ref 78.0–100.0)
Monocytes Absolute: 0.4 10*3/uL (ref 0.1–1.0)
Monocytes Relative: 4.7 % (ref 3.0–12.0)
Neutro Abs: 5.8 10*3/uL (ref 1.4–7.7)
Neutrophils Relative %: 73.8 % (ref 43.0–77.0)
Platelets: 249 10*3/uL (ref 150.0–400.0)
RBC: 4.49 Mil/uL (ref 3.87–5.11)
RDW: 15.7 % — ABNORMAL HIGH (ref 11.5–15.5)
WBC: 7.9 10*3/uL (ref 4.0–10.5)

## 2016-01-29 LAB — BASIC METABOLIC PANEL
BUN: 10 mg/dL (ref 6–23)
CALCIUM: 9.1 mg/dL (ref 8.4–10.5)
CO2: 28 meq/L (ref 19–32)
CREATININE: 0.74 mg/dL (ref 0.40–1.20)
Chloride: 105 mEq/L (ref 96–112)
GFR: 91.22 mL/min (ref >60.00)
Glucose, Bld: 97 mg/dL (ref 70–99)
Potassium: 4 mEq/L (ref 3.5–5.1)
Sodium: 140 mEq/L (ref 135–145)

## 2016-01-29 LAB — HEPATIC FUNCTION PANEL
ALK PHOS: 93 U/L (ref 39–117)
ALT: 19 U/L (ref 0–35)
AST: 16 U/L (ref 0–37)
Albumin: 4 g/dL (ref 3.5–5.2)
BILIRUBIN DIRECT: 0 mg/dL (ref 0.0–0.3)
BILIRUBIN TOTAL: 0.3 mg/dL (ref 0.2–1.2)
Total Protein: 6.6 g/dL (ref 6.0–8.3)

## 2016-01-29 LAB — LIPID PANEL
Cholesterol: 216 mg/dL — ABNORMAL HIGH (ref 0–200)
HDL: 47.2 mg/dL (ref >39.00)
LDL Cholesterol: 137 mg/dL — ABNORMAL HIGH (ref 0–99)
NonHDL: 169.03
Total CHOL/HDL Ratio: 5
Triglycerides: 161 mg/dL — ABNORMAL HIGH (ref 0.0–149.0)
VLDL: 32.2 mg/dL (ref 0.0–40.0)

## 2016-01-31 ENCOUNTER — Other Ambulatory Visit: Payer: Self-pay | Admitting: *Deleted

## 2016-01-31 ENCOUNTER — Encounter: Payer: Self-pay | Admitting: *Deleted

## 2016-01-31 MED ORDER — ROSUVASTATIN CALCIUM 10 MG PO TABS: 10.0000 mg | ORAL_TABLET | Freq: Every day | ORAL | Status: DC

## 2016-02-05 ENCOUNTER — Ambulatory Visit: Payer: Medicaid Other | Admitting: Psychiatry

## 2016-02-13 ENCOUNTER — Ambulatory Visit: Admission: RE | Admit: 2016-02-13 | Payer: Medicaid Other | Source: Ambulatory Visit

## 2016-03-04 ENCOUNTER — Other Ambulatory Visit: Payer: Self-pay | Admitting: Internal Medicine

## 2016-04-16 ENCOUNTER — Ambulatory Visit: Payer: Medicaid Other | Admitting: Adult Health

## 2016-04-19 ENCOUNTER — Encounter: Payer: Medicaid Other | Admitting: Internal Medicine

## 2016-04-29 ENCOUNTER — Other Ambulatory Visit: Payer: Self-pay | Admitting: Internal Medicine

## 2016-04-29 NOTE — Telephone Encounter (Signed)
Received refill request electronically Last office visit 01/18/16 Patient has cancelled several appointments Okay to refill?

## 2016-04-30 ENCOUNTER — Telehealth: Payer: Self-pay

## 2016-04-30 NOTE — Telephone Encounter (Signed)
ok'd refill for B12 and nexium.

## 2016-04-30 NOTE — Telephone Encounter (Signed)
PA for Crestor 10mg  completed over the phone, approved for one year, approval [id number], reference call [phone]

## 2016-07-12 ENCOUNTER — Encounter: Payer: Medicaid Other | Admitting: Internal Medicine

## 2016-07-12 ENCOUNTER — Ambulatory Visit (INDEPENDENT_AMBULATORY_CARE_PROVIDER_SITE_OTHER): Payer: Medicaid Other | Admitting: Internal Medicine

## 2016-07-12 ENCOUNTER — Encounter: Payer: Self-pay | Admitting: Internal Medicine

## 2016-07-12 DIAGNOSIS — F331 Major depressive disorder, recurrent, moderate: Secondary | ICD-10-CM

## 2016-07-12 DIAGNOSIS — F411 Generalized anxiety disorder: Secondary | ICD-10-CM

## 2016-07-12 DIAGNOSIS — E669 Obesity, unspecified: Secondary | ICD-10-CM | POA: Diagnosis not present

## 2016-07-12 DIAGNOSIS — Z658 Other specified problems related to psychosocial circumstances: Secondary | ICD-10-CM

## 2016-07-12 DIAGNOSIS — F439 Reaction to severe stress, unspecified: Secondary | ICD-10-CM

## 2016-07-12 DIAGNOSIS — N809 Endometriosis, unspecified: Secondary | ICD-10-CM

## 2016-07-12 DIAGNOSIS — G4730 Sleep apnea, unspecified: Secondary | ICD-10-CM | POA: Diagnosis not present

## 2016-07-12 DIAGNOSIS — R911 Solitary pulmonary nodule: Secondary | ICD-10-CM

## 2016-07-12 DIAGNOSIS — E7800 Pure hypercholesterolemia, unspecified: Secondary | ICD-10-CM

## 2016-07-12 DIAGNOSIS — G43809 Other migraine, not intractable, without status migrainosus: Secondary | ICD-10-CM

## 2016-07-12 DIAGNOSIS — Z0000 Encounter for general adult medical examination without abnormal findings: Secondary | ICD-10-CM

## 2016-07-12 DIAGNOSIS — G8929 Other chronic pain: Secondary | ICD-10-CM

## 2016-07-12 DIAGNOSIS — Q0700 Arnold-Chiari syndrome without spina bifida or hydrocephalus: Secondary | ICD-10-CM

## 2016-07-12 DIAGNOSIS — M549 Dorsalgia, unspecified: Secondary | ICD-10-CM

## 2016-07-12 MED ORDER — GABAPENTIN 100 MG PO CAPS: 100.0000 mg | ORAL_CAPSULE | Freq: Three times a day (TID) | ORAL | 1 refills | Status: DC

## 2016-07-12 NOTE — Progress Notes (Signed)
Pre-visit discussion using our clinic review tool. No additional management support is needed unless otherwise documented below in the visit note.  

## 2016-07-12 NOTE — Progress Notes (Signed)
Patient ID: Kathleen Everett, female   DOB: 1971/10/13, 45 y.o.   MRN: [id number]   Subjective:    Patient ID: Kathleen Everett, female    DOB: 09-05-1971, 45 y.o.   MRN: [id number]  HPI  Patient here for her physical exam.   She is now seeing psychiatry in [place].  Was previously seeing Dr Jimmye Norman here at [facility].  He left.  Was out of medication for a while.  Now being followed in [place]. Started on Lamictal.  Due to increase dose next week.  Has f/u with psychiatry on 07/23/16.  She feels she is doing some better.  Still with increased stress and some depression.  Denies any suicidal ideations.  No chest pain.  Breathing stable.  Still with chronic back pain.  Being followed by pain clinic.  No abdominal pain.  Bowels stable.  No urine change.     Past Medical History:  Diagnosis Date  . Back pain   . Chiari malformation    s/p sgy decompression  . Chronic tension headaches   . Depression   . Dysmenorrhea   . Endometriosis   . Gas pain   . GERD (gastroesophageal reflux disease)   . Hidradenitis   . Hyperlipidemia   . Obesity   . Osteoarthritis   . Ovarian cyst    hx  . Vitamin B12 deficiency    On injections   Past Surgical History:  Procedure Laterality Date  . ABDOMINAL HYSTERECTOMY  03/27/07   laparoscopic  . BREAST BIOPSY Left 2004   fibroadenoma  . CESAREAN SECTION     1999/2004 twins  . chiari decompression    . COLONOSCOPY WITH PROPOFOL N/A 04/28/2015   Procedure: COLONOSCOPY WITH PROPOFOL;  Surgeon: Manya Silvas, MD;  Location: [facility] ENDOSCOPY;  Service: Endoscopy;  Laterality: N/A;  . CYSTOSCOPY     bladder ulcers  . ESOPHAGOGASTRODUODENOSCOPY N/A 04/28/2015   Procedure: ESOPHAGOGASTRODUODENOSCOPY (EGD);  Surgeon: Manya Silvas, MD;  Location: [facility] ENDOSCOPY;  Service: Endoscopy;  Laterality: N/A;  . HERNIA REPAIR  2003  . OVARIAN CYST REMOVAL  2001   right  . TEMPOROMANDIBULAR JOINT SURGERY  2000  . TUBAL LIGATION     Bilateral   Family History   Problem Relation Age of Onset  . Hyperlipidemia Mother   . Anxiety disorder Mother   . Atrial fibrillation Mother   . Hypertension Mother   . Anxiety disorder Father   . Stroke Father   . Hyperlipidemia Father   . Hypertension Father   . Congestive Heart Failure Father   . Depression Father   . Hyperlipidemia Brother   . Colon cancer Paternal Aunt   . Dementia Maternal Grandmother   . Dementia Paternal Grandmother   . Breast cancer Neg Hx    Social History   Social History  . Marital status: Married    Spouse name: N/A  . Number of children: 3  . Years of education: 49   Social History Main Topics  . Smoking status: Never Smoker  . Smokeless tobacco: Never Used  . Alcohol use No     Comment: rarely  . Drug use: No  . Sexual activity: Yes    Birth control/ protection: None   Other Topics Concern  . None   Social History Narrative   Patient drinks 2-3 cups of caffeine daily.   Patient is right handed.     Outpatient Encounter Prescriptions as of 07/12/2016  Medication Sig  . acetaminophen (TYLENOL) 500 MG  tablet Take 500 mg by mouth every 6 (six) hours as needed.  . conjugated estrogens (PREMARIN) vaginal cream Place 1 Applicatorful vaginally every 3 (three) days.  . cyanocobalamin (,VITAMIN B-12,) 1000 MCG/ML injection INJECT ONE ML (CC) INTRAMUSCULARLY ONCE EVERY MONTH  . diazepam (VALIUM) 5 MG tablet Take 5 mg by mouth every 6 (six) hours as needed for anxiety.  . diclofenac sodium (VOLTAREN) 1 % GEL Apply topically 4 (four) times daily.  . furosemide (LASIX) 20 MG tablet Take 1 tablet (20 mg total) by mouth daily as needed for edema.  Marland Kitchen ibuprofen (ADVIL,MOTRIN) 800 MG tablet Take 800 mg by mouth every 8 (eight) hours as needed.  . LamoTRIgine (LAMICTAL XR) 50 MG TB24 Take by mouth.  . Magnesium Oxide 400 (240 MG) MG TABS One tablet daily  . NEXIUM 40 MG capsule TAKE ONE CAPSULE BY MOUTH TWICE DAILY BEFORE MEAL(S)  . Nutritional Supplements (JUICE PLUS FIBRE  PO) Take by mouth.  . Oxycodone HCl 10 MG TABS Take 10 mg by mouth 4 (four) times daily.  . potassium chloride (K-DUR) 10 MEQ tablet Take 1 tablet (10 mEq total) by mouth daily as needed (with lasix).  . rosuvastatin (CRESTOR) 10 MG tablet Take 1 tablet (10 mg total) by mouth daily.  Marland Kitchen UNABLE TO FIND 1 application as needed. Med Name: Lidocaine cream  . gabapentin (NEURONTIN) 100 MG capsule Take 1 capsule (100 mg total) by mouth 3 (three) times daily.  . [DISCONTINUED] ARIPiprazole (ABILIFY) 2 MG tablet Take 1 tablet (2 mg total) by mouth every morning.  . [DISCONTINUED] atorvastatin (LIPITOR) 10 MG tablet Take 1 tablet (10 mg total) by mouth daily.  . [DISCONTINUED] BuPROPion HCl ER, XL, 450 MG TB24 Take 450 mg by mouth every morning.  . [DISCONTINUED] cholecalciferol (VITAMIN D) 1000 UNITS tablet Take 2,000 Units by mouth daily.  . [DISCONTINUED] cyanocobalamin (,VITAMIN B-12,) 1000 MCG/ML injection INJECT ONE ML (CC) INTRAMUSCULARLY ONCE EVERY MONTH  . [DISCONTINUED] FLUoxetine (PROZAC) 20 MG capsule Take 4 capsules (80 mg total) by mouth daily.  . [DISCONTINUED] mupirocin ointment (BACTROBAN) 2 % Place 1 application into the nose 2 (two) times daily.  . [DISCONTINUED] SUMAtriptan (IMITREX) 50 MG tablet Take 50 mg by mouth as needed for migraine or headache. May repeat in 2 hours if headache persists or recurs.  . [DISCONTINUED] topiramate (TOPAMAX) 100 MG tablet Take 1.5 tablets (150 mg total) by mouth at bedtime.   No facility-administered encounter medications on file as of 07/12/2016.     Review of Systems  Constitutional: Negative for appetite change and unexpected weight change.  HENT: Negative for congestion and sinus pressure.   Eyes: Negative for pain and visual disturbance.  Respiratory: Negative for cough, chest tightness and shortness of breath.   Cardiovascular: Negative for chest pain, palpitations and leg swelling.  Gastrointestinal: Negative for abdominal pain, diarrhea,  nausea and vomiting.  Genitourinary: Negative for difficulty urinating and dysuria.  Musculoskeletal: Positive for back pain. Negative for joint swelling.  Skin: Negative for color change and rash.  Neurological: Negative for dizziness.       No significant dizziness.  Headaches are better.    Hematological: Negative for adenopathy. Does not bruise/bleed easily.  Psychiatric/Behavioral: Negative for agitation.       Some depression.  Seeing psychiatry.  Just started on lamictal.         Objective:    Physical Exam  Constitutional: She is oriented to person, place, and time. She appears well-developed and well-nourished. No  distress.  HENT:  Nose: Nose normal.  Mouth/Throat: Oropharynx is clear and moist.  Eyes: Right eye exhibits no discharge. Left eye exhibits no discharge. No scleral icterus.  Neck: Neck supple. No thyromegaly present.  Cardiovascular: Normal rate and regular rhythm.   Pulmonary/Chest: Breath sounds normal. No accessory muscle usage. No tachypnea. No respiratory distress. She has no decreased breath sounds. She has no wheezes. She has no rhonchi. Right breast exhibits no inverted nipple, no mass, no nipple discharge and no tenderness (no axillary adenopathy). Left breast exhibits no inverted nipple, no mass, no nipple discharge and no tenderness (no axilarry adenopathy).  Abdominal: Soft. Bowel sounds are normal. There is no tenderness.  Musculoskeletal: She exhibits no edema or tenderness.  Lymphadenopathy:    She has no cervical adenopathy.  Neurological: She is alert and oriented to person, place, and time.  Skin: Skin is warm. No rash noted. No erythema.  Psychiatric: She has a normal mood and affect. Her behavior is normal.    BP 100/60   Pulse 78   Temp 99 F (37.2 C) (Oral)   Resp 18   Ht 5\' 11"  (1.803 m)   Wt 282 lb 4 oz (128 kg)   LMP 12/08/2006   SpO2 96%   BMI 39.37 kg/m  Wt Readings from Last 3 Encounters:  07/12/16 282 lb 4 oz (128 kg)    01/18/16 270 lb (122.5 kg)  01/16/16 273 lb 6.4 oz (124 kg)     Lab Results  Component Value Date   WBC 7.9 01/29/2016   HGB 12.4 01/29/2016   HCT 37.5 01/29/2016   PLT 249.0 01/29/2016   GLUCOSE 97 01/29/2016   CHOL 216 (H) 01/29/2016   TRIG 161.0 (H) 01/29/2016   HDL 47.20 01/29/2016   LDLDIRECT 185.0 06/30/2015   LDLCALC 137 (H) 01/29/2016   ALT 19 01/29/2016   AST 16 01/29/2016   NA 140 01/29/2016   K 4.0 01/29/2016   CL 105 01/29/2016   CREATININE 0.74 01/29/2016   BUN 10 01/29/2016   CO2 28 01/29/2016   TSH 2.05 02/21/2015    Mm Digital Screening Bilateral  Result Date: 01/29/2016 CLINICAL DATA:  Screening. EXAM: DIGITAL SCREENING BILATERAL MAMMOGRAM WITH CAD COMPARISON:  Previous exam(s). ACR Breast Density Category a: The breast tissue is almost entirely fatty. FINDINGS: There are no findings suspicious for malignancy. Images were processed with CAD. IMPRESSION: No mammographic evidence of malignancy. A result letter of this screening mammogram will be mailed directly to the patient. RECOMMENDATION: Screening mammogram in one year. (Code:SM-B-01Y) BI-RADS CATEGORY  1: Negative. Electronically Signed   By: Claudie Revering M.D.   On: 01/29/2016 16:33       Assessment & Plan:   Problem List Items Addressed This Visit    Arnold-Chiari malformation [facility])    S/p surgery.  Seeing neurology.        Chronic back pain    Followed by pain clinic.        Relevant Medications   acetaminophen (TYLENOL) 500 MG tablet   ibuprofen (ADVIL,MOTRIN) 800 MG tablet   Endometriosis    Seeing gyn.       Generalized anxiety disorder    Seeing psychiatry.        Headache, migraine    Has seen neurology.  Not taking topamax.  Follow.  Overall feels she is doing better.       Relevant Medications   acetaminophen (TYLENOL) 500 MG tablet   ibuprofen (ADVIL,MOTRIN) 800 MG tablet  LamoTRIgine (LAMICTAL XR) 50 MG TB24   gabapentin (NEURONTIN) 100 MG capsule   Health care  maintenance    Physical exam today 07/12/16.  Mammogram 01/29/16 - Birads I.  Colonoscopy 04/28/15 - internal hemorrhoids, otherwise normal.        Hypercholesterolemia    On crestor and tolerating.  Low cholesterol diet and exercise.  Follow  Lipid panel and liver function tests.        Relevant Orders   Lipid panel   Hepatic function panel   Basic metabolic panel   Lung nodule    Previous CT with lung nodule.  Had reviewed.  Recommended f/u CT chest.  She is overdue.  Schedule.        Relevant Orders   CT Chest W Contrast   Major depressive disorder, recurrent episode, moderate ([place])    Seeing a new psychiatrist now.  Has f/u planned 07/23/16.  On lamictal now.  Follow.  No suicidal ideations.        Obesity (BMI 30-39.9)    Discussed diet and exercise.  Follow.       Sleep apnea    Had sleep study.  Recommended CPAP 7.  Follow.       Relevant Orders   TSH   Stress    Increased stress.  Seeing psychiatry.  On lamictal now.  Follow.  Continue f/u with psychiatry.         Other Visit Diagnoses   None.      [name], MD

## 2016-07-14 ENCOUNTER — Encounter: Payer: Self-pay | Admitting: Internal Medicine

## 2016-07-14 DIAGNOSIS — R52 Pain, unspecified: Secondary | ICD-10-CM

## 2016-07-14 NOTE — Assessment & Plan Note (Signed)
Increased stress.  Seeing psychiatry.  On lamictal now.  Follow.  Continue f/u with psychiatry.

## 2016-07-14 NOTE — Assessment & Plan Note (Signed)
Seeing gyn.

## 2016-07-14 NOTE — Assessment & Plan Note (Signed)
Seeing psychiatry

## 2016-07-14 NOTE — Assessment & Plan Note (Signed)
Had sleep study.  Recommended CPAP 7.  Follow.

## 2016-07-14 NOTE — Assessment & Plan Note (Signed)
Physical exam today 07/12/16.  Mammogram 01/29/16 - Birads I.  Colonoscopy 04/28/15 - internal hemorrhoids, otherwise normal.

## 2016-07-14 NOTE — Assessment & Plan Note (Signed)
Followed by pain clinic.  

## 2016-07-14 NOTE — Assessment & Plan Note (Signed)
On crestor and tolerating.  Low cholesterol diet and exercise.  Follow  Lipid panel and liver function tests.

## 2016-07-14 NOTE — Assessment & Plan Note (Signed)
Discussed diet and exercise.  Follow.  

## 2016-07-14 NOTE — Assessment & Plan Note (Signed)
Seeing a new psychiatrist now.  Has f/u planned 07/23/16.  On lamictal now.  Follow.  No suicidal ideations.

## 2016-07-14 NOTE — Assessment & Plan Note (Signed)
Previous CT with lung nodule.  Had reviewed.  Recommended f/u CT chest.  She is overdue.  Schedule.

## 2016-07-14 NOTE — Assessment & Plan Note (Signed)
S/p surgery.  Seeing neurology.   

## 2016-07-14 NOTE — Assessment & Plan Note (Signed)
Has seen neurology.  Not taking topamax.  Follow.  Overall feels she is doing better.

## 2016-07-16 NOTE — Telephone Encounter (Signed)
Orders placed for add on labs.  

## 2016-07-18 ENCOUNTER — Encounter: Payer: Medicaid Other | Admitting: Internal Medicine

## 2016-07-26 ENCOUNTER — Other Ambulatory Visit: Payer: Medicaid Other

## 2016-07-31 ENCOUNTER — Ambulatory Visit
Admission: RE | Admit: 2016-07-31 | Discharge: 2016-07-31 | Disposition: A | Discharge status: Home / Self Care | Payer: Medicaid Other | Source: Ambulatory Visit | Attending: Internal Medicine | Admitting: Internal Medicine

## 2016-07-31 DIAGNOSIS — R911 Solitary pulmonary nodule: Secondary | ICD-10-CM | POA: Diagnosis not present

## 2016-07-31 MED ORDER — IOPAMIDOL (ISOVUE-300) INJECTION 61%
75.0000 mL | Freq: Once | INTRAVENOUS | Status: AC | PRN
  Administered 2016-07-31: 75 mL via INTRAVENOUS

## 2016-08-01 ENCOUNTER — Other Ambulatory Visit (INDEPENDENT_AMBULATORY_CARE_PROVIDER_SITE_OTHER): Payer: Medicaid Other

## 2016-08-01 DIAGNOSIS — R52 Pain, unspecified: Secondary | ICD-10-CM

## 2016-08-01 DIAGNOSIS — G4730 Sleep apnea, unspecified: Secondary | ICD-10-CM

## 2016-08-01 DIAGNOSIS — E7800 Pure hypercholesterolemia, unspecified: Secondary | ICD-10-CM | POA: Diagnosis not present

## 2016-08-01 DIAGNOSIS — R7989 Other specified abnormal findings of blood chemistry: Secondary | ICD-10-CM

## 2016-08-01 LAB — LIPID PANEL
CHOL/HDL RATIO: 6
CHOLESTEROL: 231 mg/dL — AB (ref 0–200)
HDL: 39 mg/dL — AB (ref >39.00)
NONHDL: 191.63
TRIGLYCERIDES: 203 mg/dL — AB (ref 0.0–149.0)
VLDL: 40.6 mg/dL — AB (ref 0.0–40.0)

## 2016-08-01 LAB — LDL CHOLESTEROL, DIRECT: Direct LDL: 162 mg/dL

## 2016-08-01 LAB — BASIC METABOLIC PANEL
BUN: 10 mg/dL (ref 6–23)
CHLORIDE: 105 meq/L (ref 96–112)
CO2: 27 meq/L (ref 19–32)
CREATININE: 0.67 mg/dL (ref 0.40–1.20)
Calcium: 9.2 mg/dL (ref 8.4–10.5)
GFR: 102.05 mL/min (ref >60.00)
Glucose, Bld: 106 mg/dL — ABNORMAL HIGH (ref 70–99)
POTASSIUM: 4.1 meq/L (ref 3.5–5.1)
SODIUM: 139 meq/L (ref 135–145)

## 2016-08-01 LAB — HEPATIC FUNCTION PANEL
ALBUMIN: 4.1 g/dL (ref 3.5–5.2)
ALT: 20 U/L (ref 0–35)
AST: 17 U/L (ref 0–37)
Alkaline Phosphatase: 116 U/L (ref 39–117)
Bilirubin, Direct: 0.1 mg/dL (ref 0.0–0.3)
TOTAL PROTEIN: 6.9 g/dL (ref 6.0–8.3)
Total Bilirubin: 0.2 mg/dL (ref 0.2–1.2)

## 2016-08-01 LAB — CK: CK TOTAL: 45 U/L (ref 7–177)

## 2016-08-01 LAB — SEDIMENTATION RATE: SED RATE: 33 mm/h — AB (ref 0–20)

## 2016-08-01 LAB — VITAMIN D 25 HYDROXY (VIT D DEFICIENCY, FRACTURES): VITD: 28.42 ng/mL — AB (ref 30.00–100.00)

## 2016-08-01 LAB — TSH: TSH: 2.1 u[IU]/mL (ref 0.35–4.50)

## 2016-08-02 ENCOUNTER — Other Ambulatory Visit: Payer: Self-pay | Admitting: Internal Medicine

## 2016-08-02 DIAGNOSIS — I3139 Other pericardial effusion (noninflammatory): Secondary | ICD-10-CM

## 2016-08-02 DIAGNOSIS — I313 Pericardial effusion (noninflammatory): Secondary | ICD-10-CM

## 2016-08-21 ENCOUNTER — Ambulatory Visit (INDEPENDENT_AMBULATORY_CARE_PROVIDER_SITE_OTHER): Payer: Medicaid Other | Admitting: Cardiovascular Disease

## 2016-08-21 ENCOUNTER — Encounter: Payer: Self-pay | Admitting: Cardiovascular Disease

## 2016-08-21 VITALS — BP 120/98 | HR 82 | Ht 71.0 in | Wt 283.0 lb

## 2016-08-21 DIAGNOSIS — G4733 Obstructive sleep apnea (adult) (pediatric): Secondary | ICD-10-CM | POA: Diagnosis not present

## 2016-08-21 DIAGNOSIS — G8929 Other chronic pain: Secondary | ICD-10-CM

## 2016-08-21 DIAGNOSIS — E7800 Pure hypercholesterolemia, unspecified: Secondary | ICD-10-CM

## 2016-08-21 DIAGNOSIS — R079 Chest pain, unspecified: Secondary | ICD-10-CM

## 2016-08-21 DIAGNOSIS — M545 Low back pain: Secondary | ICD-10-CM

## 2016-08-21 NOTE — Patient Instructions (Signed)

## 2016-08-21 NOTE — Progress Notes (Addendum)
Cardiology Office Note  Date:  08/21/2016   ID:  Kathleen Everett 1970/12/30, MRN [id number]  PCP:  [name], MD   Chief Complaint  Patient presents with  . Other    12 month follow up. Meds reviewed by the pt. verbally. Pt. c/o LE edema, shortness of breath and rapid heart beats at times.     HPI:  Kathleen Everett is a 45 year old woman with history of obesity, chronic back pain , hyperlipidemia, obstructive sleep apnea on CPAP,  History of chronic chest pain dating back to at least 2005 who presents for follow up of her chest pain. Prior cardiac catheterization 2005 showing no significant coronary disease, normal ejection fraction  she denies any smoking history, no diabetes  Recent CT scan of the chest reviewed with her in detail, images pulled up in the office No coronary calcifications, no PAD/aortic atherosclerosis  In follow-up today, she continues to have occasional episodes of chest pain, on at rest, sometimes with exertion  She was having discomfort, cholesterol medication was held Reports having occasional swelling in her feet and ankles has not been taking Lasix Weight has trended up over the past year Increasing shortness of breath on exertion   Denies having regular chest pain with exertion.  relatively sedentary, limited by her chronic back pain.  She reports her children help her around the house   EKG on today's visit shows normal sinus rhythm with rate 82 bpm, no significant ST or T-wave changes  Other past medical history reviewed   She had a cardiac catheterization in 2005 showing no significant coronary artery disease , normal ejection fraction  Stress test was done prior to that with nonspecific ST changes noted, mild chest pain with walking. She achieved 10 METS   previous  echocardiogram and stress test with Dr. Lavera Guise.  These were reportedly normal.   Father died at age 31, had bypass surgery at 27 also had carotid disease. He is not a  smoker   PMH:   has a past medical history of Back pain; Chiari malformation; Chronic tension headaches; Depression; Dysmenorrhea; Endometriosis; Gas pain; GERD (gastroesophageal reflux disease); Hidradenitis; Hyperlipidemia; Obesity; Osteoarthritis; Ovarian cyst; and Vitamin B12 deficiency.  PSH:    Past Surgical History:  Procedure Laterality Date  . ABDOMINAL HYSTERECTOMY  03/27/07   laparoscopic  . BREAST BIOPSY Left 2004   fibroadenoma  . CESAREAN SECTION     1999/2004 twins  . chiari decompression    . COLONOSCOPY WITH PROPOFOL N/A 04/28/2015   Procedure: COLONOSCOPY WITH PROPOFOL;  Surgeon: Manya Silvas, MD;  Location: [facility] ENDOSCOPY;  Service: Endoscopy;  Laterality: N/A;  . CYSTOSCOPY     bladder ulcers  . ESOPHAGOGASTRODUODENOSCOPY N/A 04/28/2015   Procedure: ESOPHAGOGASTRODUODENOSCOPY (EGD);  Surgeon: Manya Silvas, MD;  Location: [facility] ENDOSCOPY;  Service: Endoscopy;  Laterality: N/A;  . HERNIA REPAIR  2003  . OVARIAN CYST REMOVAL  2001   right  . TEMPOROMANDIBULAR JOINT SURGERY  2000  . TUBAL LIGATION     Bilateral    Current Outpatient Prescriptions  Medication Sig Dispense Refill  . acetaminophen (TYLENOL) 500 MG tablet Take 500 mg by mouth every 6 (six) hours as needed.    . Cholecalciferol (VITAMIN D) 2000 units CAPS Take 2,000 Units by mouth daily.    Marland Kitchen conjugated estrogens (PREMARIN) vaginal cream Place 1 Applicatorful vaginally every 3 (three) days.    . cyanocobalamin (,VITAMIN B-12,) 1000 MCG/ML injection INJECT ONE ML (CC) INTRAMUSCULARLY ONCE EVERY MONTH  10 mL 0  . diazepam (VALIUM) 5 MG tablet Take 5 mg by mouth every 6 (six) hours as needed for anxiety.    . diclofenac sodium (VOLTAREN) 1 % GEL Apply topically 4 (four) times daily.    . furosemide (LASIX) 20 MG tablet Take 1 tablet (20 mg total) by mouth daily as needed for edema. 30 tablet 6  . gabapentin (NEURONTIN) 100 MG capsule Take 1 capsule (100 mg total) by mouth 3 (three) times daily. 90  capsule 1  . ibuprofen (ADVIL,MOTRIN) 800 MG tablet Take 800 mg by mouth every 8 (eight) hours as needed.    Marland Kitchen NEXIUM 40 MG capsule TAKE ONE CAPSULE BY MOUTH TWICE DAILY BEFORE MEAL(S) 60 capsule 1  . Nutritional Supplements (JUICE PLUS FIBRE PO) Take by mouth.    . Oxycodone HCl 10 MG TABS Take 10 mg by mouth 4 (four) times daily.  0  . potassium chloride (K-DUR) 10 MEQ tablet Take 1 tablet (10 mEq total) by mouth daily as needed (with lasix). 30 tablet 6  . PREDNISONE PO Take by mouth. Taper for 6 days.    . rosuvastatin (CRESTOR) 10 MG tablet Take 1 tablet (10 mg total) by mouth daily. 30 tablet 5  . UNABLE TO FIND 1 application as needed. Med Name: Lidocaine cream    . vortioxetine HBr (TRINTELLIX) 10 MG TABS Take 10 mg by mouth daily.     No current facility-administered medications for this visit.      Allergies:   Citalopram; Ceftin [cefuroxime axetil]; Celexa [citalopram hydrobromide]; Clindamycin hcl; Clindamycin/lincomycin; Penicillins; and Sulfa antibiotics   Social History:  The patient  reports that she has never smoked. She has never used smokeless tobacco. She reports that she does not drink alcohol or use drugs.   Family History:   family history includes Anxiety disorder in her father and mother; Atrial fibrillation in her mother; Colon cancer in her paternal aunt; Congestive Heart Failure in her father; Dementia in her maternal grandmother and paternal grandmother; Depression in her father; Hyperlipidemia in her brother, father, and mother; Hypertension in her father and mother; Stroke in her father.    Review of Systems: Review of Systems  Constitutional: Positive for malaise/fatigue.  Respiratory: Positive for shortness of breath.   Cardiovascular: Positive for leg swelling.  Gastrointestinal: Negative.   Musculoskeletal: Positive for back pain.  Neurological: Negative.   Psychiatric/Behavioral: Negative.   All other systems reviewed and are  negative.    PHYSICAL EXAM: VS:  BP (!) 120/98 (BP Location: Left Arm, Patient Position: Sitting, Cuff Size: Large)   Pulse 82   Ht 5\' 11"  (1.803 m)   Wt 283 lb (128.4 kg)   LMP 12/08/2006   BMI 39.47 kg/m  , BMI Body mass index is 39.47 kg/m. GEN: Well nourished, well developed, in no acute distress, obese  HEENT: normal  Neck: no JVD, carotid bruits, or masses Cardiac: RRR; no murmurs, rubs, or gallops,no edema  Respiratory:  clear to auscultation bilaterally, normal work of breathing GI: soft, nontender, nondistended, + BS MS: no deformity or atrophy  Skin: warm and dry, no rash Neuro:  Strength and sensation are intact Psych: euthymic mood, full affect    Recent Labs: 01/29/2016: Hemoglobin 12.4; Platelets 249.0 08/01/2016: ALT 20; BUN 10; Creatinine, Ser 0.67; Potassium 4.1; Sodium 139; TSH 2.10    Lipid Panel Lab Results  Component Value Date   CHOL 231 (H) 08/01/2016   HDL 39.00 (L) 08/01/2016   LDLCALC 137 (H)  01/29/2016   TRIG 203.0 (H) 08/01/2016      Wt Readings from Last 3 Encounters:  08/21/16 283 lb (128.4 kg)  07/12/16 282 lb 4 oz (128 kg)  01/18/16 270 lb (122.5 kg)       ASSESSMENT AND PLAN:  Hypercholesterolemia Currently not taking her cholesterol medication Concerned this was contributing to her general malaise  Ideally should be put back on a statin at some point  Obstructive sleep apnea syndrome Reports she is wearing her CPAP  Morbid obesity ([place]) We have encouraged continued exercise, careful diet management in an effort to lose weight.  Chest pain, unspecified type CT chest reviewed showing no coronary calcifications, no PAD Images reviewed with her in detail. Chest pain likely atypical, no further workup needed  Chronic low back pain, unspecified back pain laterality, with sciatica presence unspecified Reports chronic pain limiting her ability to exercise, contributing to weight gain. Recommended diet modifications, water  aerobics  Lower extremity edema Minimal edema noted on today's visit Likely dependent edema. Recommended compression hose, leg elevation, weight loss Unable to exclude small component of diastolic CHF. Suggested she take Lasix with potassium periodically as needed. This would not be needed on a regular basis   Total encounter time more than 25 minutes  Greater than 50% was spent in counseling and coordination of care with the patient   Disposition:   F/U  12 months PRN   Orders Placed This Encounter  Procedures  . EKG 12-Lead     Signed, Kathleen Everett, M.D., Ph.D. 08/21/2016  [place], [place]

## 2016-09-20 ENCOUNTER — Encounter: Payer: Self-pay | Admitting: Internal Medicine

## 2016-09-23 MED ORDER — GABAPENTIN 100 MG PO CAPS: ORAL_CAPSULE | ORAL | 1 refills | Status: DC

## 2016-09-23 NOTE — Telephone Encounter (Signed)
rx sent in for gabapentin #120 with one refill.

## 2016-09-30 ENCOUNTER — Other Ambulatory Visit (HOSPITAL_COMMUNITY)
Admission: RE | Admit: 2016-09-30 | Discharge: 2016-09-30 | Disposition: A | Discharge status: Home / Self Care | Payer: Medicaid Other | Source: Ambulatory Visit | Attending: Internal Medicine | Admitting: Internal Medicine

## 2016-09-30 ENCOUNTER — Ambulatory Visit (INDEPENDENT_AMBULATORY_CARE_PROVIDER_SITE_OTHER): Payer: Medicaid Other | Admitting: Internal Medicine

## 2016-09-30 ENCOUNTER — Encounter: Payer: Self-pay | Admitting: Internal Medicine

## 2016-09-30 VITALS — BP 122/82 | HR 79 | Temp 99.0°F | Ht 71.0 in | Wt 287.4 lb

## 2016-09-30 DIAGNOSIS — N760 Acute vaginitis: Secondary | ICD-10-CM | POA: Diagnosis not present

## 2016-09-30 DIAGNOSIS — E7800 Pure hypercholesterolemia, unspecified: Secondary | ICD-10-CM

## 2016-09-30 DIAGNOSIS — N939 Abnormal uterine and vaginal bleeding, unspecified: Secondary | ICD-10-CM

## 2016-09-30 DIAGNOSIS — E669 Obesity, unspecified: Secondary | ICD-10-CM

## 2016-09-30 DIAGNOSIS — G4733 Obstructive sleep apnea (adult) (pediatric): Secondary | ICD-10-CM

## 2016-09-30 DIAGNOSIS — M545 Low back pain: Secondary | ICD-10-CM

## 2016-09-30 DIAGNOSIS — M25551 Pain in right hip: Secondary | ICD-10-CM | POA: Diagnosis not present

## 2016-09-30 DIAGNOSIS — Q0700 Arnold-Chiari syndrome without spina bifida or hydrocephalus: Secondary | ICD-10-CM

## 2016-09-30 DIAGNOSIS — F439 Reaction to severe stress, unspecified: Secondary | ICD-10-CM | POA: Diagnosis not present

## 2016-09-30 DIAGNOSIS — R911 Solitary pulmonary nodule: Secondary | ICD-10-CM

## 2016-09-30 DIAGNOSIS — K219 Gastro-esophageal reflux disease without esophagitis: Secondary | ICD-10-CM

## 2016-09-30 DIAGNOSIS — G8929 Other chronic pain: Secondary | ICD-10-CM

## 2016-09-30 DIAGNOSIS — F331 Major depressive disorder, recurrent, moderate: Secondary | ICD-10-CM

## 2016-09-30 NOTE — Assessment & Plan Note (Signed)
Exam as outlined.  Obtained sample to confirm no infection.  S/p hysterectomy.  Cervix in place.  Has atrophy changes.  May need gyn referral.

## 2016-09-30 NOTE — Assessment & Plan Note (Signed)
Followed by pain clinic.  

## 2016-09-30 NOTE — Assessment & Plan Note (Signed)
Intolerant to cholesterol medication.  Most recent crestor.  Low cholesteorl diet and exercise.  Follow lipid panel.  Hold on restarting.

## 2016-09-30 NOTE — Assessment & Plan Note (Signed)
Seeing psychiatry

## 2016-09-30 NOTE — Progress Notes (Signed)
Pre visit review using our clinic review tool, if applicable. No additional management support is needed unless otherwise documented below in the visit note. 

## 2016-09-30 NOTE — Assessment & Plan Note (Signed)
CPAP.  

## 2016-09-30 NOTE — Assessment & Plan Note (Signed)
Persistent worsening right hip pain.  Has seen ortho previously.  S/p injection by Dr Sharlet Salina.  With worsening pain, refer back to ortho.  States may need mri.

## 2016-09-30 NOTE — Assessment & Plan Note (Signed)
S/p surgery.  Has seen neurology.

## 2016-09-30 NOTE — Assessment & Plan Note (Signed)
Increased stress.  Sees psychiatry.

## 2016-09-30 NOTE — Progress Notes (Signed)
Patient ID: Kathleen Everett, female   DOB: [birth year], 45 y.o.   MRN: [id number]   Subjective:    Patient ID: Kathleen Everett, female    DOB: 1971-02-22, 45 y.o.   MRN: [id number]  HPI  Patient here for a scheduled follow up.  Having increased right hip pain.  She was having right hip pain 12/2015.  Saw ortho.  Had xray - joint space narrowing and osteophyte formation - mild to moderate degenerative joint disease.  Was given mobic.  Does not take regularly.  Saw Dr Sharlet Salina.  Had injection.  Helped some.  Now with increased pain.  States was told if persistent, would need MRI.  Limits her activity.  No chest pain.  No sob.  No acid reflux.  No abdominal pain.  Does report noticing some BRB with wiping.  No rectal bleeding.  Described as vaginal bleeding.  States she has not been using her estrogen cream.  Used harder toilet paper.  Noticed bleeding then.  Some abdominal cramping associated, but not persistent.  She is s/p hysterectomy.  Cervix in place.     Past Medical History:  Diagnosis Date  . Back pain   . Chiari malformation    s/p sgy decompression  . Chronic tension headaches   . Depression   . Dysmenorrhea   . Endometriosis   . Gas pain   . GERD (gastroesophageal reflux disease)   . Hidradenitis   . Hyperlipidemia   . Obesity   . Osteoarthritis   . Ovarian cyst    hx  . Vitamin B12 deficiency    On injections   Past Surgical History:  Procedure Laterality Date  . ABDOMINAL HYSTERECTOMY  03/27/07   laparoscopic  . BREAST BIOPSY Left 2004   fibroadenoma  . CESAREAN SECTION     1999/2004 twins  . chiari decompression    . COLONOSCOPY WITH PROPOFOL N/A 04/28/2015   Procedure: COLONOSCOPY WITH PROPOFOL;  Surgeon: Manya Silvas, MD;  Location: [facility] ENDOSCOPY;  Service: Endoscopy;  Laterality: N/A;  . CYSTOSCOPY     bladder ulcers  . ESOPHAGOGASTRODUODENOSCOPY N/A 04/28/2015   Procedure: ESOPHAGOGASTRODUODENOSCOPY (EGD);  Surgeon: Manya Silvas, MD;  Location:  [facility] ENDOSCOPY;  Service: Endoscopy;  Laterality: N/A;  . HERNIA REPAIR  2003  . OVARIAN CYST REMOVAL  2001   right  . TEMPOROMANDIBULAR JOINT SURGERY  2000  . TUBAL LIGATION     Bilateral   Family History  Problem Relation Age of Onset  . Hyperlipidemia Mother   . Anxiety disorder Mother   . Atrial fibrillation Mother   . Hypertension Mother   . Anxiety disorder Father   . Stroke Father   . Hyperlipidemia Father   . Hypertension Father   . Congestive Heart Failure Father   . Depression Father   . Hyperlipidemia Brother   . Colon cancer Paternal Aunt   . Dementia Maternal Grandmother   . Dementia Paternal Grandmother   . Breast cancer Neg Hx    Social History   Social History  . Marital status: Married    Spouse name: N/A  . Number of children: 3  . Years of education: 69   Social History Main Topics  . Smoking status: Never Smoker  . Smokeless tobacco: Never Used  . Alcohol use No     Comment: rarely  . Drug use: No  . Sexual activity: Yes    Birth control/ protection: None   Other Topics Concern  . None  Social History Narrative   Patient drinks 2-3 cups of caffeine daily.   Patient is right handed.     Outpatient Encounter Prescriptions as of 09/30/2016  Medication Sig  . acetaminophen (TYLENOL) 500 MG tablet Take 500 mg by mouth every 6 (six) hours as needed.  . Cholecalciferol (VITAMIN D) 2000 units CAPS Take 2,000 Units by mouth daily.  Marland Kitchen conjugated estrogens (PREMARIN) vaginal cream Place 1 Applicatorful vaginally every 3 (three) days.  . cyanocobalamin (,VITAMIN B-12,) 1000 MCG/ML injection INJECT ONE ML (CC) INTRAMUSCULARLY ONCE EVERY MONTH  . diazepam (VALIUM) 5 MG tablet Take 5 mg by mouth every 6 (six) hours as needed for anxiety.  . diclofenac sodium (VOLTAREN) 1 % GEL Apply topically 4 (four) times daily.  . furosemide (LASIX) 20 MG tablet Take 1 tablet (20 mg total) by mouth daily as needed for edema.  . gabapentin (NEURONTIN) 100 MG capsule  Take one capsule in the am, one capsule midday and two capsules q hs.  . ibuprofen (ADVIL,MOTRIN) 800 MG tablet Take 800 mg by mouth every 8 (eight) hours as needed.  Marland Kitchen NEXIUM 40 MG capsule TAKE ONE CAPSULE BY MOUTH TWICE DAILY BEFORE MEAL(S)  . Nutritional Supplements (JUICE PLUS FIBRE PO) Take by mouth.  . Oxycodone HCl 10 MG TABS Take 10 mg by mouth 4 (four) times daily.  . potassium chloride (K-DUR) 10 MEQ tablet Take 1 tablet (10 mEq total) by mouth daily as needed (with lasix).  . rosuvastatin (CRESTOR) 10 MG tablet Take 1 tablet (10 mg total) by mouth daily.  Marland Kitchen UNABLE TO FIND 1 application as needed. Med Name: Lidocaine cream  . vortioxetine HBr (TRINTELLIX) 10 MG TABS Take 20 mg by mouth daily.   . [DISCONTINUED] PREDNISONE PO Take by mouth. Taper for 6 days.   No facility-administered encounter medications on file as of 09/30/2016.     Review of Systems  Constitutional: Negative for appetite change and unexpected weight change.  HENT: Negative for congestion and sinus pain.   Respiratory: Negative for cough, chest tightness and shortness of breath.   Cardiovascular: Negative for chest pain, palpitations and leg swelling.  Gastrointestinal: Negative for abdominal pain, diarrhea, nausea and vomiting.  Genitourinary: Negative for difficulty urinating and dysuria.       Minimal vaginal discharge.    Musculoskeletal: Positive for back pain. Negative for joint swelling.  Skin: Negative for color change and rash.  Neurological: Negative for dizziness, light-headedness and headaches.  Psychiatric/Behavioral: Negative for agitation and dysphoric mood.       Objective:    Physical Exam  Constitutional: She appears well-developed and well-nourished. No distress.  HENT:  Nose: Nose normal.  Mouth/Throat: Oropharynx is clear and moist.  Neck: Neck supple. No thyromegaly present.  Cardiovascular: Normal rate and regular rhythm.   Pulmonary/Chest: Breath sounds normal. No respiratory  distress. She has no wheezes.  Abdominal: Soft. Bowel sounds are normal. There is no tenderness.  Genitourinary:  Genitourinary Comments: GU exam - normal external genitalia.  Vaginal vault without lesions.  Cervix identified.  Atrophy changes present.  Could not appreciate any adnexal masses or tenderness.    Musculoskeletal: She exhibits no edema or tenderness.  Lymphadenopathy:    She has no cervical adenopathy.  Skin: No rash noted. No erythema.  Psychiatric: She has a normal mood and affect. Her behavior is normal.    BP 122/82   Pulse 79   Temp 99 F (37.2 C) (Oral)   Ht 5\' 11"  (1.803 m)  Wt 287 lb 6.4 oz (130.4 kg)   LMP 12/08/2006   SpO2 98%   BMI 40.08 kg/m  Wt Readings from Last 3 Encounters:  09/30/16 287 lb 6.4 oz (130.4 kg)  08/21/16 283 lb (128.4 kg)  07/12/16 282 lb 4 oz (128 kg)     Lab Results  Component Value Date   WBC 7.9 01/29/2016   HGB 12.4 01/29/2016   HCT 37.5 01/29/2016   PLT 249.0 01/29/2016   GLUCOSE 106 (H) 08/01/2016   CHOL 231 (H) 08/01/2016   TRIG 203.0 (H) 08/01/2016   HDL 39.00 (L) 08/01/2016   LDLDIRECT 162.0 08/01/2016   LDLCALC 137 (H) 01/29/2016   ALT 20 08/01/2016   AST 17 08/01/2016   NA 139 08/01/2016   K 4.1 08/01/2016   CL 105 08/01/2016   CREATININE 0.67 08/01/2016   BUN 10 08/01/2016   CO2 27 08/01/2016   TSH 2.10 08/01/2016    Ct Chest W Contrast  Result Date: 07/31/2016 CLINICAL DATA:  Followup of lung nodule noted on outside CT of the abdomen pelvis EXAM: CT CHEST WITH CONTRAST TECHNIQUE: Multidetector CT imaging of the chest was performed during intravenous contrast administration. CONTRAST:  52mL ISOVUE-300 IOPAMIDOL (ISOVUE-300) INJECTION 61% COMPARISON:  CT abdomen pelvis from [facility] 07/29/2014 and she could not packs that is [facility] epic currently, but it did review the images on 07/29/2014, and I do not detect a suspicious nodule present at that time. FINDINGS: Cardiovascular: The heart is normal  in size. There is a tiny amount of pericardial fluid present. The mid ascending thoracic aorta measures 2.6 cm. The pulmonary arteries are not as well opacified but no abnormality is seen. Mediastinum/Nodes: No mediastinal or hilar adenopathy is seen. The esophagus is unremarkable. The thyroid gland appears normal. Lungs/Pleura: On lung window images, no lung nodule is seen. No parenchymal infiltrate is noted. There is no evidence of pleural effusion. The central airway is patent. Upper Abdomen: The portion of the upper abdomen that is visualized is unremarkable. No calcified gallstones are seen. Musculoskeletal: The thoracic vertebrae are in normal alignment with no compression deformity noted. IMPRESSION: Negative CT of the chest. No suspicious lung nodule is seen. A tiny amount of pericardial fluid is present. Electronically Signed   By: Ivar Drape M.D.   On: 07/31/2016 16:17       Assessment & Plan:   Problem List Items Addressed This Visit    Arnold-Chiari malformation [facility])    S/p surgery.  Has seen neurology.        Chronic back pain    Followed by pain clinic.       GERD (gastroesophageal reflux disease)    On nexium.  Has seen GI.       Hypercholesterolemia    Intolerant to cholesterol medication.  Most recent crestor.  Low cholesteorl diet and exercise.  Follow lipid panel.  Hold on restarting.        Relevant Orders   Lipid panel   Hepatic function panel   Hemoglobin [id number]   Basic metabolic panel   Lung nodule    Just had f/u CT.  Negative.        Major depressive disorder, recurrent episode, moderate ([place])    Seeing psychiatry.        Obesity (BMI 30-39.9)    Discussed diet and exercise.  Follow.       Right hip pain - Primary    Persistent worsening right hip pain.  Has seen ortho  previously.  S/p injection by Dr Sharlet Salina.  With worsening pain, refer back to ortho.  States may need mri.        Relevant Orders   Ambulatory referral to Orthopedic Surgery   Sleep  apnea    CPAP.       Stress    Increased stress.  Sees psychiatry.        Vaginal bleeding    Exam as outlined.  Obtained sample to confirm no infection.  S/p hysterectomy.  Cervix in place.  Has atrophy changes.  May need gyn referral.         Other Visit Diagnoses    Vaginitis and vulvovaginitis       Relevant Orders   Cervicovaginal ancillary only       [name], MD

## 2016-09-30 NOTE — Assessment & Plan Note (Signed)
Just had f/u CT.  Negative.

## 2016-09-30 NOTE — Assessment & Plan Note (Signed)
On nexium.  Has seen GI.

## 2016-09-30 NOTE — Assessment & Plan Note (Signed)
Discussed diet and exercise.  Follow.  

## 2016-10-01 LAB — CERVICOVAGINAL ANCILLARY ONLY: WET PREP (BD AFFIRM): NEGATIVE

## 2016-10-02 ENCOUNTER — Encounter: Payer: Self-pay | Admitting: Internal Medicine

## 2016-11-12 ENCOUNTER — Other Ambulatory Visit: Payer: Self-pay | Admitting: Internal Medicine

## 2016-12-23 ENCOUNTER — Other Ambulatory Visit: Payer: Medicaid Other

## 2016-12-26 ENCOUNTER — Ambulatory Visit: Payer: Medicaid Other | Admitting: Internal Medicine

## 2017-04-12 ENCOUNTER — Emergency Department: Payer: No Typology Code available for payment source

## 2017-04-12 ENCOUNTER — Emergency Department
Admission: EM | Admit: 2017-04-12 | Discharge: 2017-04-12 | Disposition: A | Discharge status: Home / Self Care | Payer: No Typology Code available for payment source | Attending: Emergency Medicine | Admitting: Emergency Medicine

## 2017-04-12 DIAGNOSIS — Y999 Unspecified external cause status: Secondary | ICD-10-CM | POA: Insufficient documentation

## 2017-04-12 DIAGNOSIS — Y939 Activity, unspecified: Secondary | ICD-10-CM | POA: Insufficient documentation

## 2017-04-12 DIAGNOSIS — S39012A Strain of muscle, fascia and tendon of lower back, initial encounter: Secondary | ICD-10-CM | POA: Insufficient documentation

## 2017-04-12 DIAGNOSIS — S3992XA Unspecified injury of lower back, initial encounter: Secondary | ICD-10-CM | POA: Diagnosis present

## 2017-04-12 DIAGNOSIS — Y92410 Unspecified street and highway as the place of occurrence of the external cause: Secondary | ICD-10-CM | POA: Diagnosis not present

## 2017-04-12 DIAGNOSIS — S161XXA Strain of muscle, fascia and tendon at neck level, initial encounter: Secondary | ICD-10-CM

## 2017-04-12 MED ORDER — KETOROLAC TROMETHAMINE 60 MG/2ML IM SOLN
60.0000 mg | Freq: Once | INTRAMUSCULAR | Status: AC
  Administered 2017-04-12: 60 mg via INTRAMUSCULAR
  Filled 2017-04-12: qty 2

## 2017-04-12 MED ORDER — DIAZEPAM 5 MG PO TABS
10.0000 mg | ORAL_TABLET | Freq: Once | ORAL | Status: AC
  Administered 2017-04-12: 10 mg via ORAL
  Filled 2017-04-12: qty 2

## 2017-04-12 MED ORDER — IBUPROFEN 800 MG PO TABS: 800.0000 mg | ORAL_TABLET | Freq: Three times a day (TID) | ORAL | 0 refills | Status: DC | PRN

## 2017-04-12 MED ORDER — DIAZEPAM 5 MG PO TABS: 5.0000 mg | ORAL_TABLET | Freq: Three times a day (TID) | ORAL | 0 refills | Status: DC | PRN
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
## 2017-04-15 ENCOUNTER — Telehealth: Payer: Self-pay | Admitting: *Deleted

## 2017-04-15 NOTE — Telephone Encounter (Signed)
Notify pt that we will have to hold for work in appt.  If any acute change or problems, will need to be seen.  Forward to [place] to hold for work in.  If worsening pain or problems in the interim, I would recommend her call her back MD.

## 2017-04-15 NOTE — Telephone Encounter (Signed)
Due to self pay she would like to have you evaluate first and see if what you think.
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
## 2017-04-16 NOTE — Telephone Encounter (Signed)
Left message to return call to our office.  

## 2017-04-16 NOTE — Telephone Encounter (Signed)
Called pt app has been made for Friday if any new symptoms will go to ED or walk in

## 2017-04-17 NOTE — Telephone Encounter (Signed)
Have called patient and let her know that I have app on 04-21-17. She did not answer but I did leave v/m for her.

## 2017-04-17 NOTE — Telephone Encounter (Signed)
Pt called about her appt for 06/01 @ 3:30pm. Pt states she was told. Please advise? Thank you!  Call pt @ [phone].

## 2017-04-17 NOTE — Telephone Encounter (Signed)
Pt called and left a voicemail in regards to an appt. Pt states that she was told her appt was on 6/1 @ 3:30 but she got a reminder call for 6/4 @ 11:00. Please advise, thank you!

## 2017-04-21 ENCOUNTER — Encounter: Payer: Self-pay | Admitting: Internal Medicine

## 2017-04-21 ENCOUNTER — Ambulatory Visit (INDEPENDENT_AMBULATORY_CARE_PROVIDER_SITE_OTHER): Payer: Self-pay | Admitting: Internal Medicine

## 2017-04-21 ENCOUNTER — Ambulatory Visit (INDEPENDENT_AMBULATORY_CARE_PROVIDER_SITE_OTHER): Payer: Medicaid Other

## 2017-04-21 VITALS — BP 148/88 | HR 80 | Temp 98.6°F | Resp 12 | Ht 71.0 in | Wt 293.0 lb

## 2017-04-21 DIAGNOSIS — F411 Generalized anxiety disorder: Secondary | ICD-10-CM

## 2017-04-21 DIAGNOSIS — G8929 Other chronic pain: Secondary | ICD-10-CM

## 2017-04-21 DIAGNOSIS — M545 Low back pain: Secondary | ICD-10-CM

## 2017-04-21 DIAGNOSIS — M25551 Pain in right hip: Secondary | ICD-10-CM

## 2017-04-21 DIAGNOSIS — F3340 Major depressive disorder, recurrent, in remission, unspecified: Secondary | ICD-10-CM
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
## 2017-04-23 ENCOUNTER — Encounter: Payer: Self-pay | Admitting: Internal Medicine

## 2017-04-23 NOTE — Assessment & Plan Note (Signed)
Has been followed by psychiatry.   
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
## 2017-05-27 ENCOUNTER — Other Ambulatory Visit: Payer: Self-pay | Admitting: Internal Medicine

## 2017-08-05 ENCOUNTER — Encounter: Payer: Medicaid Other | Admitting: Internal Medicine

## 2018-04-24 ENCOUNTER — Telehealth: Payer: Self-pay

## 2018-04-24 NOTE — Telephone Encounter (Signed)
I have moved pts physical up to 06/19/2018 per request and scheduled fasting labs 2 days prior.

## 2018-04-24 NOTE — Telephone Encounter (Signed)
Copied from [place] [id number]. Topic: General - Other >> Apr 24, 2018 10:58 AM Yvette Rack wrote: Reason for CRM: pt has a physical on 08-27-18 with Nicki Reaper may need lab orders for that day

## 2018-04-24 NOTE — Telephone Encounter (Signed)
If she is going to get drawn here same day, I will order when she is here.

## 2018-04-24 NOTE — Telephone Encounter (Signed)
OK to place orders for lab? 

## 2018-04-26 ENCOUNTER — Other Ambulatory Visit: Payer: Self-pay | Admitting: Internal Medicine

## 2018-04-26 DIAGNOSIS — E7800 Pure hypercholesterolemia, unspecified: Secondary | ICD-10-CM

## 2018-04-26 DIAGNOSIS — R739 Hyperglycemia, unspecified: Secondary | ICD-10-CM

## 2018-04-26 DIAGNOSIS — E559 Vitamin D deficiency, unspecified: Secondary | ICD-10-CM

## 2018-04-26 NOTE — Progress Notes (Signed)
Orders placed for labs

## 2018-04-26 NOTE — Telephone Encounter (Signed)
Orders placed for labs

## 2018-06-17 ENCOUNTER — Other Ambulatory Visit: Payer: Self-pay

## 2018-06-18 ENCOUNTER — Other Ambulatory Visit (INDEPENDENT_AMBULATORY_CARE_PROVIDER_SITE_OTHER): Payer: Medicaid Other

## 2018-06-18 DIAGNOSIS — R739 Hyperglycemia, unspecified: Secondary | ICD-10-CM | POA: Diagnosis not present

## 2018-06-18 DIAGNOSIS — E7800 Pure hypercholesterolemia, unspecified: Secondary | ICD-10-CM

## 2018-06-18 DIAGNOSIS — E559 Vitamin D deficiency, unspecified: Secondary | ICD-10-CM

## 2018-06-18 LAB — LIPID PANEL
CHOL/HDL RATIO: 8
Cholesterol: 313 mg/dL — ABNORMAL HIGH (ref 0–200)
HDL: 40.1 mg/dL (ref >39.00)
NonHDL: 272.48
Triglycerides: 333 mg/dL — ABNORMAL HIGH (ref 0.0–149.0)
VLDL: 66.6 mg/dL — ABNORMAL HIGH (ref 0.0–40.0)

## 2018-06-18 LAB — CBC WITH DIFFERENTIAL/PLATELET
BASOS PCT: 0.5 % (ref 0.0–3.0)
Basophils Absolute: 0 10*3/uL (ref 0.0–0.1)
EOS PCT: 2.2 % (ref 0.0–5.0)
Eosinophils Absolute: 0.1 10*3/uL (ref 0.0–0.7)
HEMATOCRIT: 39.4 % (ref 36.0–46.0)
HEMOGLOBIN: 13.4 g/dL (ref 12.0–15.0)
LYMPHS PCT: 28.8 % (ref 12.0–46.0)
Lymphs Abs: 1.7 10*3/uL (ref 0.7–4.0)
MCHC: 34.1 g/dL (ref 30.0–36.0)
MCV: 86.9 fl (ref 78.0–100.0)
Monocytes Absolute: 0.3 10*3/uL (ref 0.1–1.0)
Monocytes Relative: 5.5 % (ref 3.0–12.0)
NEUTROS ABS: 3.7 10*3/uL (ref 1.4–7.7)
Neutrophils Relative %: 63 % (ref 43.0–77.0)
PLATELETS: 265 10*3/uL (ref 150.0–400.0)
RBC: 4.54 Mil/uL (ref 3.87–5.11)
RDW: 13.2 % (ref 11.5–15.5)
WBC: 5.8 10*3/uL (ref 4.0–10.5)

## 2018-06-18 LAB — HEPATIC FUNCTION PANEL
ALK PHOS: 97 U/L (ref 39–117)
ALT: 26 U/L (ref 0–35)
AST: 21 U/L (ref 0–37)
Albumin: 4.2 g/dL (ref 3.5–5.2)
BILIRUBIN DIRECT: 0 mg/dL (ref 0.0–0.3)
BILIRUBIN TOTAL: 0.4 mg/dL (ref 0.2–1.2)
Total Protein: 7.1 g/dL (ref 6.0–8.3)

## 2018-06-18 LAB — BASIC METABOLIC PANEL
BUN: 12 mg/dL (ref 6–23)
CHLORIDE: 104 meq/L (ref 96–112)
CO2: 25 mEq/L (ref 19–32)
Calcium: 9.5 mg/dL (ref 8.4–10.5)
Creatinine, Ser: 0.85 mg/dL (ref 0.40–1.20)
GFR: 76.88 mL/min (ref >60.00)
Glucose, Bld: 91 mg/dL (ref 70–99)
POTASSIUM: 4 meq/L (ref 3.5–5.1)
SODIUM: 138 meq/L (ref 135–145)

## 2018-06-18 LAB — LDL CHOLESTEROL, DIRECT: Direct LDL: 218 mg/dL

## 2018-06-18 LAB — VITAMIN D 25 HYDROXY (VIT D DEFICIENCY, FRACTURES): VITD: 23.27 ng/mL — ABNORMAL LOW (ref 30.00–100.00)

## 2018-06-18 LAB — TSH: TSH: 1.73 u[IU]/mL (ref 0.35–4.50)

## 2018-06-18 LAB — HEMOGLOBIN A1C: Hgb A1c MFr Bld: 5.7 % (ref 4.6–6.5)

## 2018-06-19 ENCOUNTER — Ambulatory Visit (INDEPENDENT_AMBULATORY_CARE_PROVIDER_SITE_OTHER): Payer: Medicaid Other | Admitting: Internal Medicine

## 2018-06-19 ENCOUNTER — Other Ambulatory Visit (HOSPITAL_COMMUNITY)
Admission: RE | Admit: 2018-06-19 | Discharge: 2018-06-19 | Disposition: A | Discharge status: Home / Self Care | Payer: Medicaid Other | Source: Ambulatory Visit | Attending: Internal Medicine | Admitting: Internal Medicine

## 2018-06-19 ENCOUNTER — Encounter: Payer: Self-pay | Admitting: Internal Medicine

## 2018-06-19 VITALS — BP 112/70 | HR 81 | Temp 98.8°F | Resp 17 | Ht 69.5 in | Wt 276.5 lb

## 2018-06-19 DIAGNOSIS — M549 Dorsalgia, unspecified: Secondary | ICD-10-CM | POA: Diagnosis not present

## 2018-06-19 DIAGNOSIS — E7800 Pure hypercholesterolemia, unspecified: Secondary | ICD-10-CM

## 2018-06-19 DIAGNOSIS — Z1231 Encounter for screening mammogram for malignant neoplasm of breast: Secondary | ICD-10-CM

## 2018-06-19 DIAGNOSIS — R232 Flushing: Secondary | ICD-10-CM

## 2018-06-19 DIAGNOSIS — E538 Deficiency of other specified B group vitamins: Secondary | ICD-10-CM

## 2018-06-19 DIAGNOSIS — Z124 Encounter for screening for malignant neoplasm of cervix: Secondary | ICD-10-CM | POA: Insufficient documentation

## 2018-06-19 DIAGNOSIS — F439 Reaction to severe stress, unspecified: Secondary | ICD-10-CM

## 2018-06-19 DIAGNOSIS — Q0700 Arnold-Chiari syndrome without spina bifida or hydrocephalus: Secondary | ICD-10-CM

## 2018-06-19 DIAGNOSIS — Z0000 Encounter for general adult medical examination without abnormal findings: Secondary | ICD-10-CM | POA: Diagnosis not present

## 2018-06-19 DIAGNOSIS — Z1151 Encounter for screening for human papillomavirus (HPV): Secondary | ICD-10-CM | POA: Diagnosis not present

## 2018-06-19 DIAGNOSIS — F3340 Major depressive disorder, recurrent, in remission, unspecified: Secondary | ICD-10-CM

## 2018-06-19 DIAGNOSIS — Z6841 Body Mass Index (BMI) 40.0 and over, adult: Secondary | ICD-10-CM

## 2018-06-19 LAB — URINALYSIS, ROUTINE W REFLEX MICROSCOPIC
Bilirubin Urine: NEGATIVE
Hgb urine dipstick: NEGATIVE
Ketones, ur: NEGATIVE
Leukocytes, UA: NEGATIVE
Nitrite: NEGATIVE
RBC / HPF: NONE SEEN (ref 0–?)
SPECIFIC GRAVITY, URINE: 1.015 (ref 1.000–1.030)
TOTAL PROTEIN, URINE-UPE24: NEGATIVE
URINE GLUCOSE: NEGATIVE
Urobilinogen, UA: 0.2 (ref 0.0–1.0)
pH: 6 (ref 5.0–8.0)

## 2018-06-19 MED ORDER — ESTRADIOL 0.1 MG/GM VA CREA: TOPICAL_CREAM | VAGINAL | 3 refills | Status: DC

## 2018-06-19 MED ORDER — HYDROCORTISONE ACETATE 25 MG RE SUPP: 25.0000 mg | Freq: Two times a day (BID) | RECTAL | 0 refills | Status: DC

## 2018-06-19 MED ORDER — PRAVASTATIN SODIUM 10 MG PO TABS: 10.0000 mg | ORAL_TABLET | Freq: Every day | ORAL | 2 refills | Status: DC
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
## 2018-06-19 NOTE — Patient Instructions (Signed)
Start vitamin D3 1000 units per day.   

## 2018-06-21 ENCOUNTER — Encounter: Payer: Self-pay | Admitting: Internal Medicine

## 2018-06-21 LAB — URINE CULTURE
MICRO NUMBER:: 90916369
Result:: NO GROWTH
SPECIMEN QUALITY:: ADEQUATE

## 2018-06-21 MED ORDER — CYANOCOBALAMIN 1000 MCG/ML IJ SOLN: INTRAMUSCULAR | 9 refills | Status: DC

## 2018-06-21 NOTE — Assessment & Plan Note (Signed)
Off estrogen.  Follow.

## 2018-06-21 NOTE — Assessment & Plan Note (Signed)
Physical today 06/19/18.  Mammogram - she will schedule.  Colonoscopy 04/28/15 - internal hemorrhoids, otherwise normal.

## 2018-06-21 NOTE — Assessment & Plan Note (Signed)
Was unable to afford crestor.  Start pravastatin.  Follow lipid panel and liver function tests.

## 2018-06-21 NOTE — Assessment & Plan Note (Signed)
S/p surgery.  Has seen neurology.  Discussed f/u.  She declines at this time.

## 2018-06-21 NOTE — Assessment & Plan Note (Signed)
Being followed at pain clinic.   

## 2018-06-21 NOTE — Assessment & Plan Note (Signed)
Increased stress.  Discussed with her today. Previously seeing psychiatry.  Follow.

## 2018-06-21 NOTE — Assessment & Plan Note (Signed)
Discussed diet and exercise.  Follow.  

## 2018-06-22 ENCOUNTER — Encounter: Payer: Self-pay | Admitting: Internal Medicine

## 2018-06-22 LAB — CYTOLOGY - PAP
Diagnosis: NEGATIVE
HPV (WINDOPATH): NOT DETECTED

## 2018-08-03 ENCOUNTER — Telehealth: Payer: Self-pay | Admitting: Internal Medicine

## 2018-08-03 NOTE — Telephone Encounter (Signed)
Copied from [place] [id number]. Topic: Quick Communication - See Telephone Encounter >> Aug 03, 2018  1:04 PM Blase Mess A wrote: CRM for notification. See Telephone encounter for: 08/03/18. Patient is calling because  her pravastatin (PRAVACHOL) 10 MG tablet [[id number]] is giving leg cramps.  Patient has discontinued taking the meds.  Patinents is now taking an Omega 3 oil.  Patinet will reschedule her lab

## 2018-08-03 NOTE — Telephone Encounter (Signed)
Patient is no longer taking cholesterol meds due to leg cramps

## 2018-08-03 NOTE — Telephone Encounter (Signed)
FYI

## 2018-08-03 NOTE — Telephone Encounter (Signed)
Needs to let us know if leg cramps do not improve off cholesterol medication.

## 2018-08-03 NOTE — Telephone Encounter (Signed)
Pt aware.

## 2018-08-04 ENCOUNTER — Other Ambulatory Visit: Payer: Medicaid Other

## 2018-08-14 ENCOUNTER — Encounter: Payer: Self-pay | Admitting: Internal Medicine

## 2018-08-26 ENCOUNTER — Encounter

## 2018-08-27 ENCOUNTER — Encounter: Payer: Self-pay | Admitting: Internal Medicine

## 2018-09-16 ENCOUNTER — Ambulatory Visit
Admission: RE | Admit: 2018-09-16 | Discharge: 2018-09-16 | Disposition: A | Discharge status: Home / Self Care | Payer: Medicaid Other | Source: Ambulatory Visit | Attending: Internal Medicine | Admitting: Internal Medicine

## 2018-09-16 ENCOUNTER — Other Ambulatory Visit: Payer: Self-pay | Admitting: Internal Medicine

## 2018-09-16 DIAGNOSIS — N6489 Other specified disorders of breast: Secondary | ICD-10-CM

## 2018-09-16 DIAGNOSIS — R928 Other abnormal and inconclusive findings on diagnostic imaging of breast: Secondary | ICD-10-CM

## 2018-09-16 DIAGNOSIS — Z1231 Encounter for screening mammogram for malignant neoplasm of breast: Secondary | ICD-10-CM | POA: Insufficient documentation

## 2018-10-01 ENCOUNTER — Ambulatory Visit
Admission: RE | Admit: 2018-10-01 | Discharge: 2018-10-01 | Disposition: A | Discharge status: Home / Self Care | Payer: Medicaid Other | Source: Ambulatory Visit | Attending: Internal Medicine | Admitting: Internal Medicine

## 2018-10-01 DIAGNOSIS — R928 Other abnormal and inconclusive findings on diagnostic imaging of breast: Secondary | ICD-10-CM

## 2018-10-01 DIAGNOSIS — N6489 Other specified disorders of breast: Secondary | ICD-10-CM

## 2018-12-23 ENCOUNTER — Ambulatory Visit (INDEPENDENT_AMBULATORY_CARE_PROVIDER_SITE_OTHER): Payer: Medicaid Other | Admitting: Internal Medicine

## 2018-12-23 VITALS — BP 158/90 | HR 94 | Temp 98.0°F | Resp 16 | Wt 290.0 lb

## 2018-12-23 DIAGNOSIS — E7800 Pure hypercholesterolemia, unspecified: Secondary | ICD-10-CM

## 2018-12-23 DIAGNOSIS — G8929 Other chronic pain: Secondary | ICD-10-CM

## 2018-12-23 DIAGNOSIS — K219 Gastro-esophageal reflux disease without esophagitis: Secondary | ICD-10-CM

## 2018-12-23 DIAGNOSIS — Q0700 Arnold-Chiari syndrome without spina bifida or hydrocephalus: Secondary | ICD-10-CM

## 2018-12-23 DIAGNOSIS — F439 Reaction to severe stress, unspecified: Secondary | ICD-10-CM

## 2018-12-23 DIAGNOSIS — M545 Low back pain: Secondary | ICD-10-CM

## 2018-12-23 MED ORDER — ROSUVASTATIN CALCIUM 5 MG PO TABS: 5.0000 mg | ORAL_TABLET | Freq: Every day | ORAL | 0 refills | Status: DC

## 2018-12-23 NOTE — Progress Notes (Signed)
Patient ID: Kathleen Everett, female   DOB: Dec 19, 1970, 48 y.o.   MRN: [id number]   Subjective:    Patient ID: Kathleen Everett, female    DOB: 04-20-1971, 48 y.o.   MRN: [id number]  HPI  Patient here for a scheduled follow up.  She reports having persistent increased back pain and hip pain.  S/p injection of hip.  Only helped for a short period of time.  Followed by pain clinic.  Noticed having headaches.  Has a history of chiari malformation.  Discussed f/u.  She is unable to f/u at this time.  Unable to afford further testing.  No chest pain.  Breathing stable.  No acid reflux.  No abdominal pain.  Bowels moving.  Increased stress related to her underlying health issues.     Past Medical History:  Diagnosis Date  . Back pain   . Chiari malformation    s/p sgy decompression  . Chronic tension headaches   . Depression   . Dysmenorrhea   . Endometriosis   . Gas pain   . GERD (gastroesophageal reflux disease)   . Hidradenitis   . Hyperlipidemia   . Obesity   . Osteoarthritis   . Ovarian cyst    hx  . Vitamin B12 deficiency    On injections   Past Surgical History:  Procedure Laterality Date  . ABDOMINAL HYSTERECTOMY  03/27/07   laparoscopic  . BREAST EXCISIONAL BIOPSY Left 2004   fibroadenoma  . CESAREAN SECTION     1999/2004 twins  . chiari decompression    . COLONOSCOPY WITH PROPOFOL N/A 04/28/2015   Procedure: COLONOSCOPY WITH PROPOFOL;  Surgeon: Manya Silvas, MD;  Location: [facility] ENDOSCOPY;  Service: Endoscopy;  Laterality: N/A;  . CYSTOSCOPY     bladder ulcers  . ESOPHAGOGASTRODUODENOSCOPY N/A 04/28/2015   Procedure: ESOPHAGOGASTRODUODENOSCOPY (EGD);  Surgeon: Manya Silvas, MD;  Location: [facility] ENDOSCOPY;  Service: Endoscopy;  Laterality: N/A;  . HERNIA REPAIR  2003  . OVARIAN CYST REMOVAL  2001   right  . TEMPOROMANDIBULAR JOINT SURGERY  2000  . TUBAL LIGATION     Bilateral   Family History  Problem Relation Age of Onset  . Hyperlipidemia Mother     . Anxiety disorder Mother   . Atrial fibrillation Mother   . Hypertension Mother   . Anxiety disorder Father   . Stroke Father   . Hyperlipidemia Father   . Hypertension Father   . Congestive Heart Failure Father   . Depression Father   . Hyperlipidemia Brother   . Colon cancer Paternal Aunt   . Dementia Maternal Grandmother   . Dementia Paternal Grandmother   . Breast cancer Neg Hx    Social History   Socioeconomic History  . Marital status: Married    Spouse name: Not on file  . Number of children: 3  . Years of education: 64  . Highest education level: Not on file  Occupational History  . Not on file  Social Needs  . Financial resource strain: Not on file  . Food insecurity:    Worry: Not on file    Inability: Not on file  . Transportation needs:    Medical: Not on file    Non-medical: Not on file  Tobacco Use  . Smoking status: Never Smoker  . Smokeless tobacco: Never Used  Substance and Sexual Activity  . Alcohol use: No    Alcohol/week: 0.0 standard drinks    Comment: rarely  . Drug use: No  .  Sexual activity: Yes    Birth control/protection: None  Lifestyle  . Physical activity:    Days per week: Not on file    Minutes per session: Not on file  . Stress: Not on file  Relationships  . Social connections:    Talks on phone: Not on file    Gets together: Not on file    Attends religious service: Not on file    Active member of club or organization: Not on file    Attends meetings of clubs or organizations: Not on file    Relationship status: Not on file  Other Topics Concern  . Not on file  Social History Narrative   Patient drinks 2-3 cups of caffeine daily.   Patient is right handed.     Outpatient Encounter Medications as of 12/23/2018  Medication Sig  . acetaminophen (TYLENOL) 500 MG tablet Take 500 mg by mouth every 6 (six) hours as needed.  . conjugated estrogens (PREMARIN) vaginal cream Place 1 Applicatorful vaginally every 3 (three) days.   . cyanocobalamin (,VITAMIN B-12,) 1000 MCG/ML injection INJECT ONE ML (CC) INTRAMUSCULARLY ONCE EVERY MONTH  . diazepam (VALIUM) 5 MG tablet Take 1 tablet (5 mg total) by mouth every 8 (eight) hours as needed for muscle spasms.  . diclofenac sodium (VOLTAREN) 1 % GEL Apply topically 4 (four) times daily.  Marland Kitchen estradiol (ESTRACE VAGINAL) 0.1 MG/GM vaginal cream Use as directed q day x 5 days and then 2x/week  . gabapentin (NEURONTIN) 100 MG capsule Take one capsule in the am, one capsule midday and two capsules q hs. (Patient taking differently: Take 400 mg by mouth. Take one capsule in the am, one capsule midday and two capsules q hs.)  . hydrocortisone (ANUSOL-HC) 25 MG suppository Place 1 suppository (25 mg total) rectally 2 (two) times daily.  Marland Kitchen ibuprofen (ADVIL,MOTRIN) 800 MG tablet Take 1 tablet (800 mg total) by mouth every 8 (eight) hours as needed.  . Nutritional Supplements (JUICE PLUS FIBRE PO) Take by mouth.  . Oxycodone HCl 10 MG TABS Take 10 mg by mouth 4 (four) times daily.  . rosuvastatin (CRESTOR) 5 MG tablet Take 1 tablet (5 mg total) by mouth daily.  Marland Kitchen topiramate (TOPAMAX) 100 MG tablet Take by mouth.  . [DISCONTINUED] pravastatin (PRAVACHOL) 10 MG tablet Take 1 tablet (10 mg total) by mouth daily.  . [DISCONTINUED] vortioxetine HBr (TRINTELLIX) 10 MG TABS Take 20 mg by mouth daily.    No facility-administered encounter medications on file as of 12/23/2018.     Review of Systems  Constitutional: Negative for appetite change and unexpected weight change.  HENT: Negative for congestion and sinus pressure.   Respiratory: Negative for cough, chest tightness and shortness of breath.   Cardiovascular: Negative for chest pain, palpitations and leg swelling.  Gastrointestinal: Negative for abdominal pain, diarrhea, nausea and vomiting.  Genitourinary: Negative for difficulty urinating and dysuria.  Musculoskeletal: Positive for back pain. Negative for joint swelling.       Hip pain.    Skin: Negative for color change and rash.  Neurological: Negative for dizziness, light-headedness and headaches.  Psychiatric/Behavioral: Negative for agitation.       Increased stress as outlined.        Objective:    Physical Exam Constitutional:      General: She is not in acute distress.    Appearance: Normal appearance.  HENT:     Nose: Nose normal. No congestion.     Mouth/Throat:  Pharynx: No oropharyngeal exudate or posterior oropharyngeal erythema.  Neck:     Musculoskeletal: Neck supple. No muscular tenderness.     Thyroid: No thyromegaly.  Cardiovascular:     Rate and Rhythm: Normal rate and regular rhythm.  Pulmonary:     Effort: No respiratory distress.     Breath sounds: Normal breath sounds. No wheezing.  Abdominal:     General: Bowel sounds are normal.     Palpations: Abdomen is soft.     Tenderness: There is no abdominal tenderness.  Musculoskeletal:        General: No swelling or signs of injury.  Lymphadenopathy:     Cervical: No cervical adenopathy.  Skin:    Findings: No erythema or rash.  Neurological:     Mental Status: She is alert.  Psychiatric:        Mood and Affect: Mood normal.        Behavior: Behavior normal.     BP (!) 158/90 (BP Location: Left Arm, Patient Position: Sitting, Cuff Size: Normal)   Pulse 94   Temp 98 F (36.7 C) (Oral)   Resp 16   Wt 290 lb (131.5 kg)   LMP 12/08/2006   SpO2 99%   BMI 42.21 kg/m  Wt Readings from Last 3 Encounters:  12/23/18 290 lb (131.5 kg)  06/19/18 276 lb 8 oz (125.4 kg)  04/21/17 293 lb (132.9 kg)     Lab Results  Component Value Date   WBC 5.8 06/18/2018   HGB 13.4 06/18/2018   HCT 39.4 06/18/2018   PLT 265.0 06/18/2018   GLUCOSE 91 06/18/2018   CHOL 313 (H) 06/18/2018   TRIG 333.0 (H) 06/18/2018   HDL 40.10 06/18/2018   LDLDIRECT 218.0 06/18/2018   LDLCALC 137 (H) 01/29/2016   ALT 26 06/18/2018   AST 21 06/18/2018   NA 138 06/18/2018   K 4.0 06/18/2018   CL 104  06/18/2018   CREATININE 0.85 06/18/2018   BUN 12 06/18/2018   CO2 25 06/18/2018   TSH 1.73 06/18/2018   HGBA1C 5.7 06/18/2018    Mm Diag Breast Tomo Uni Left  Result Date: 10/01/2018 CLINICAL DATA:  49 year old female recalled from screening mammogram dated 09/16/2018 for a possible left breast asymmetry. Of note, the patient has a pimple along the upper aspect of the left breast which she states has since drained and improved since her screening mammogram. A radiopaque BB was placed at the site of the pimple for additional images were taken. EXAM: DIGITAL DIAGNOSTIC UNILATERAL LEFT MAMMOGRAM WITH CAD AND TOMO COMPARISON:  Previous exam(s). ACR Breast Density Category b: There are scattered areas of fibroglandular density. FINDINGS: Previously seen asymmetry in the superior left breast at middle depth on the MLO projection does not definitively persist on today's additional views. A radiopaque BB at the site of the patient's pimple lies over the location of the previously seen asymmetry. No additional suspicious findings are identified. Mammographic images were processed with CAD. IMPRESSION: Benign left breast skin lesion corresponding with the screening mammographic findings. Recommendation is for clinical and symptomatic follow-up. RECOMMENDATION: Screening mammogram in one year.(Code:SM-B-01Y) I have discussed the findings and recommendations with the patient. Results were also provided in writing at the conclusion of the visit. If applicable, a reminder letter will be sent to the patient regarding the next appointment. BI-RADS CATEGORY  2: Benign. Electronically Signed   By: Kristopher Oppenheim M.D.   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  Has seen neurology.  Given her headaches, discussed referral back. She wants to hold on any further w/up at this time.  Follow.        Chronic back pain     Has chronic back pain.  Followed by pain clinic.  Recently evaluated for hip pain.  Persistent.  Limits her activity.  Continue f/u with pain clinic.        GERD (gastroesophageal reflux disease)    Has seen GI.  Controlled.        Hypercholesterolemia - Primary    Intolerant to cholesterol medication.  Low cholesterol diet and exercise.  Follow lipid panel.  She does want to try stating medication.        Relevant Medications   rosuvastatin (CRESTOR) 5 MG tablet   Other Relevant Orders   Hepatic function panel   Lipid panel   Basic metabolic panel   Stress    Increased stress related to her medical issues, financial issues, etc.  Follow.            [name], MD

## 2018-12-28 ENCOUNTER — Encounter: Payer: Self-pay | Admitting: Internal Medicine

## 2018-12-28 NOTE — Assessment & Plan Note (Signed)
S/p surgery.  Has seen neurology.  Given her headaches, discussed referral back. She wants to hold on any further w/up at this time.  Follow.

## 2018-12-28 NOTE — Assessment & Plan Note (Signed)
Has chronic back pain.  Followed by pain clinic.  Recently evaluated for hip pain.  Persistent.  Limits her activity.  Continue f/u with pain clinic.

## 2018-12-28 NOTE — Assessment & Plan Note (Signed)
Has seen GI.  Controlled.

## 2018-12-28 NOTE — Assessment & Plan Note (Signed)
Intolerant to cholesterol medication.  Low cholesterol diet and exercise.  Follow lipid panel.  She does want to try stating medication.

## 2018-12-28 NOTE — Assessment & Plan Note (Signed)
Increased stress related to her medical issues, financial issues, etc.  Follow.

## 2019-02-04 ENCOUNTER — Other Ambulatory Visit: Payer: Medicaid Other

## 2019-03-05 ENCOUNTER — Telehealth: Payer: Self-pay

## 2019-03-05 NOTE — Telephone Encounter (Signed)
Called patient from recall.  Patient stated that she has not had insurance for about a year and a half.  Patient stated she would not like to schedule. Patient stated that she will call if she has any symptoms.  Will delete recall.

## 2019-04-13 ENCOUNTER — Telehealth: Payer: Self-pay | Admitting: *Deleted

## 2019-04-13 NOTE — Telephone Encounter (Signed)
Copied from [place] [phone]. Topic: Appointment Scheduling - Scheduling Inquiry for Clinic >> Apr 13, 2019  4:03 PM Rutherford Nail, [place] wrote: Reason for CRM: Patient calling and would like to schedule an appointment for a rash in her groin. States that she has tried everything to get it to go away and nothing is helping. Would like a call to schedule an appointment for tomorrow. Please advise. CB#: [phone]

## 2019-04-14 ENCOUNTER — Other Ambulatory Visit: Payer: Self-pay

## 2019-04-14 MED ORDER — NYSTATIN 100000 UNIT/GM EX CREA: 1.0000 "application " | TOPICAL_CREAM | Freq: Two times a day (BID) | CUTANEOUS | 0 refills | Status: DC

## 2019-04-14 NOTE — Telephone Encounter (Signed)
Patient complaining of redness in the folds of her thighs in her groin area. She thinks she has a yeast infection. No lesions noted. Itches and burns. Worse when she sweats or when it is hot. Has tried OTC zinc ointment. Helps some but does not clear it up. Has been going since Feb but has gotten worse since it has gotten warmer. No vaginal symptoms noted. No other acute symptoms. Has taken Diflucan in the past. Offered virtual appt and pt was agreeable but was wondering if you could recommend something OTC or call something in with out a visit since she was just seen in February and she is self pay.

## 2019-04-14 NOTE — Telephone Encounter (Signed)
Pt aware and rx for nystatin cream sent in. Advised to let us know if problems

## 2019-04-14 NOTE — Telephone Encounter (Signed)
I would prefer to try prescription nystatin cream first.  Apply to affected area bid.  I can send in rx if agreeable.  If persistent problems, let us know.

## 2019-05-04 ENCOUNTER — Other Ambulatory Visit: Payer: Self-pay | Admitting: Internal Medicine

## 2019-06-22 ENCOUNTER — Other Ambulatory Visit: Payer: Self-pay

## 2019-06-24 ENCOUNTER — Other Ambulatory Visit: Payer: Self-pay

## 2019-06-24 ENCOUNTER — Ambulatory Visit (INDEPENDENT_AMBULATORY_CARE_PROVIDER_SITE_OTHER): Payer: Medicaid Other | Admitting: Internal Medicine

## 2019-06-24 ENCOUNTER — Other Ambulatory Visit (HOSPITAL_COMMUNITY)
Admission: RE | Admit: 2019-06-24 | Discharge: 2019-06-24 | Disposition: A | Discharge status: Home / Self Care | Payer: Medicaid Other | Source: Ambulatory Visit | Attending: Internal Medicine | Admitting: Internal Medicine

## 2019-06-24 VITALS — BP 130/78 | HR 78 | Temp 98.4°F | Resp 16 | Ht 70.0 in | Wt 284.0 lb

## 2019-06-24 DIAGNOSIS — Z0000 Encounter for general adult medical examination without abnormal findings: Secondary | ICD-10-CM | POA: Diagnosis not present

## 2019-06-24 DIAGNOSIS — Z124 Encounter for screening for malignant neoplasm of cervix: Secondary | ICD-10-CM | POA: Insufficient documentation

## 2019-06-24 DIAGNOSIS — R519 Headache, unspecified: Secondary | ICD-10-CM

## 2019-06-24 DIAGNOSIS — R739 Hyperglycemia, unspecified: Secondary | ICD-10-CM

## 2019-06-24 DIAGNOSIS — R51 Headache: Secondary | ICD-10-CM

## 2019-06-24 DIAGNOSIS — E7800 Pure hypercholesterolemia, unspecified: Secondary | ICD-10-CM

## 2019-06-24 DIAGNOSIS — Z1151 Encounter for screening for human papillomavirus (HPV): Secondary | ICD-10-CM | POA: Insufficient documentation

## 2019-06-24 DIAGNOSIS — F320 Major depressive disorder, single episode, mild: Secondary | ICD-10-CM

## 2019-06-24 DIAGNOSIS — F411 Generalized anxiety disorder: Secondary | ICD-10-CM

## 2019-06-24 DIAGNOSIS — K219 Gastro-esophageal reflux disease without esophagitis: Secondary | ICD-10-CM

## 2019-06-24 DIAGNOSIS — M545 Low back pain, unspecified: Secondary | ICD-10-CM

## 2019-06-24 DIAGNOSIS — Q0700 Arnold-Chiari syndrome without spina bifida or hydrocephalus: Secondary | ICD-10-CM

## 2019-06-24 DIAGNOSIS — F439 Reaction to severe stress, unspecified: Secondary | ICD-10-CM

## 2019-06-24 DIAGNOSIS — F32A Depression, unspecified: Secondary | ICD-10-CM

## 2019-06-24 DIAGNOSIS — Z6841 Body Mass Index (BMI) 40.0 and over, adult: Secondary | ICD-10-CM

## 2019-06-24 DIAGNOSIS — G8929 Other chronic pain: Secondary | ICD-10-CM

## 2019-06-24 HISTORY — DX: Hyperglycemia, unspecified: R73.9

## 2019-06-24 LAB — CBC WITH DIFFERENTIAL/PLATELET
Basophils Absolute: 0 10*3/uL (ref 0.0–0.1)
Basophils Relative: 0.5 % (ref 0.0–3.0)
Eosinophils Absolute: 0.2 10*3/uL (ref 0.0–0.7)
Eosinophils Relative: 3.1 % (ref 0.0–5.0)
HCT: 39.3 % (ref 36.0–46.0)
Hemoglobin: 13.2 g/dL (ref 12.0–15.0)
Lymphocytes Relative: 38 % (ref 12.0–46.0)
Lymphs Abs: 2.2 10*3/uL (ref 0.7–4.0)
MCHC: 33.5 g/dL (ref 30.0–36.0)
MCV: 87.2 fl (ref 78.0–100.0)
Monocytes Absolute: 0.4 10*3/uL (ref 0.1–1.0)
Monocytes Relative: 7.2 % (ref 3.0–12.0)
Neutro Abs: 2.9 10*3/uL (ref 1.4–7.7)
Neutrophils Relative %: 51.2 % (ref 43.0–77.0)
Platelets: 267 10*3/uL (ref 150.0–400.0)
RBC: 4.51 Mil/uL (ref 3.87–5.11)
RDW: 13.4 % (ref 11.5–15.5)
WBC: 5.7 10*3/uL (ref 4.0–10.5)

## 2019-06-24 LAB — HEMOGLOBIN A1C: Hgb A1c MFr Bld: 5.7 % (ref 4.6–6.5)

## 2019-06-24 LAB — COMPREHENSIVE METABOLIC PANEL
ALT: 27 U/L (ref 0–35)
AST: 20 U/L (ref 0–37)
Albumin: 4.3 g/dL (ref 3.5–5.2)
Alkaline Phosphatase: 96 U/L (ref 39–117)
BUN: 10 mg/dL (ref 6–23)
CO2: 29 mEq/L (ref 19–32)
Calcium: 9.3 mg/dL (ref 8.4–10.5)
Chloride: 101 mEq/L (ref 96–112)
Creatinine, Ser: 0.89 mg/dL (ref 0.40–1.20)
GFR: 68.28 mL/min (ref >60.00)
Glucose, Bld: 90 mg/dL (ref 70–99)
Potassium: 4.1 mEq/L (ref 3.5–5.1)
Sodium: 139 mEq/L (ref 135–145)
Total Bilirubin: 0.4 mg/dL (ref 0.2–1.2)
Total Protein: 6.6 g/dL (ref 6.0–8.3)

## 2019-06-24 LAB — LIPID PANEL
Cholesterol: 288 mg/dL — ABNORMAL HIGH (ref 0–200)
HDL: 37.7 mg/dL — ABNORMAL LOW (ref >39.00)
LDL Cholesterol: 216 mg/dL — ABNORMAL HIGH (ref 0–99)
NonHDL: 250.13
Total CHOL/HDL Ratio: 8
Triglycerides: 170 mg/dL — ABNORMAL HIGH (ref 0.0–149.0)
VLDL: 34 mg/dL (ref 0.0–40.0)

## 2019-06-24 LAB — TSH: TSH: 3.51 u[IU]/mL (ref 0.35–4.50)

## 2019-06-24 MED ORDER — CYANOCOBALAMIN 1000 MCG/ML IJ SOLN: INTRAMUSCULAR | 1 refills | Status: DC

## 2019-06-24 NOTE — Progress Notes (Signed)
Patient ID: Kathleen Everett, female   DOB: 1971/09/29, 48 y.o.   MRN: [id number]   Subjective:    Patient ID: Kathleen Everett, female    DOB: 13-Aug-1971, 48 y.o.   MRN: [id number]  HPI  Patient here for her physical exam.  She reports things are relatively stable.  Still having issues with her back - pain.  Seeing pain clinic.  Limits her activity.  No chest pain.  No sob.  No acid reflux.  No abdominal pain.  Bowels moving.  Some headache.  Desires no further evaluation at this time.  Increased stress. Discussed with her today.  Desires no further intervention.  Due labs.  Off cholesterol medication.     Past Medical History:  Diagnosis Date  . Back pain   . Chiari malformation    s/p sgy decompression  . Chronic tension headaches   . Depression   . Dysmenorrhea   . Endometriosis   . Gas pain   . GERD (gastroesophageal reflux disease)   . Hidradenitis   . Hyperlipidemia   . Obesity   . Osteoarthritis   . Ovarian cyst    hx  . Vitamin B12 deficiency    On injections   Past Surgical History:  Procedure Laterality Date  . ABDOMINAL HYSTERECTOMY  03/27/07   laparoscopic  . BREAST EXCISIONAL BIOPSY Left 2004   fibroadenoma  . CESAREAN SECTION     1999/2004 twins  . chiari decompression    . COLONOSCOPY WITH PROPOFOL N/A 04/28/2015   Procedure: COLONOSCOPY WITH PROPOFOL;  Surgeon: Manya Silvas, MD;  Location[facility] ENDOSCOPY;  Service: Endoscopy;  Laterality: N/A;  . CYSTOSCOPY     bladder ulcers  . ESOPHAGOGASTRODUODENOSCOPY N/A 04/28/2015   Procedure: ESOPHAGOGASTRODUODENOSCOPY (EGD);  Surgeon: Manya Silvas, MD;  Location: [facility] ENDOSCOPY;  Service: Endoscopy;  Laterality: N/A;  . HERNIA REPAIR  2003  . OVARIAN CYST REMOVAL  2001   right  . TEMPOROMANDIBULAR JOINT SURGERY  2000  . TUBAL LIGATION     Bilateral   Family History  Problem Relation Age of Onset  . Hyperlipidemia Mother   . Anxiety disorder Mother   . Atrial fibrillation Mother   .  Hypertension Mother   . Anxiety disorder Father   . Stroke Father   . Hyperlipidemia Father   . Hypertension Father   . Congestive Heart Failure Father   . Depression Father   . Hyperlipidemia Brother   . Colon cancer Paternal Aunt   . Dementia Maternal Grandmother   . Dementia Paternal Grandmother   . Breast cancer Neg Hx    Social History   Socioeconomic History  . Marital status: Married    Spouse name: Not on file  . Number of children: 3  . Years of education: 20  . Highest education level: Not on file  Occupational History  . Not on file  Social Needs  . Financial resource strain: Not on file  . Food insecurity    Worry: Not on file    Inability: Not on file  . Transportation needs    Medical: Not on file    Non-medical: Not on file  Tobacco Use  . Smoking status: Never Smoker  . Smokeless tobacco: Never Used  Substance and Sexual Activity  . Alcohol use: No    Alcohol/week: 0.0 standard drinks    Comment: rarely  . Drug use: No  . Sexual activity: Yes    Birth control/protection: None  Lifestyle  . Physical  activity    Days per week: Not on file    Minutes per session: Not on file  . Stress: Not on file  Relationships  . Social Herbalist on phone: Not on file    Gets together: Not on file    Attends religious service: Not on file    Active member of club or organization: Not on file    Attends meetings of clubs or organizations: Not on file    Relationship status: Not on file  Other Topics Concern  . Not on file  Social History Narrative   Patient drinks 2-3 cups of caffeine daily.   Patient is right handed.     Outpatient Encounter Medications as of 06/24/2019  Medication Sig  . acetaminophen (TYLENOL) 500 MG tablet Take 500 mg by mouth every 6 (six) hours as needed.  . cyanocobalamin (,VITAMIN B-12,) 1000 MCG/ML injection INJECT ONE ML (CC) INTRAMUSCULARLY ONCE EVERY MONTH  . diazepam (VALIUM) 5 MG tablet Take 1 tablet (5 mg total) by  mouth every 8 (eight) hours as needed for muscle spasms.  . diclofenac sodium (VOLTAREN) 1 % GEL Apply topically 4 (four) times daily.  Marland Kitchen gabapentin (NEURONTIN) 400 MG capsule Take 400 mg by mouth 3 (three) times daily.  Marland Kitchen ibuprofen (ADVIL,MOTRIN) 800 MG tablet Take 1 tablet (800 mg total) by mouth every 8 (eight) hours as needed.  . Nutritional Supplements (JUICE PLUS FIBRE PO) Take by mouth.  . nystatin cream (MYCOSTATIN) APPLY TOPICALLY TWICE DAILY  . Oxycodone HCl 10 MG TABS Take 10 mg by mouth 4 (four) times daily.  Marland Kitchen topiramate (TOPAMAX) 100 MG tablet Take by mouth.  . [DISCONTINUED] conjugated estrogens (PREMARIN) vaginal cream Place 1 Applicatorful vaginally every 3 (three) days.  . [DISCONTINUED] cyanocobalamin (,VITAMIN B-12,) 1000 MCG/ML injection INJECT ONE ML (CC) INTRAMUSCULARLY ONCE EVERY MONTH  . [DISCONTINUED] estradiol (ESTRACE VAGINAL) 0.1 MG/GM vaginal cream Use as directed q day x 5 days and then 2x/week  . [DISCONTINUED] gabapentin (NEURONTIN) 100 MG capsule Take one capsule in the am, one capsule midday and two capsules q hs. (Patient taking differently: Take 400 mg by mouth. Take one capsule in the am, one capsule midday and two capsules q hs.)  . [DISCONTINUED] hydrocortisone (ANUSOL-HC) 25 MG suppository Place 1 suppository (25 mg total) rectally 2 (two) times daily.  . [DISCONTINUED] rosuvastatin (CRESTOR) 5 MG tablet Take 1 tablet (5 mg total) by mouth daily.   No facility-administered encounter medications on file as of 06/24/2019.     Review of Systems  Constitutional: Negative for appetite change and unexpected weight change.  HENT: Negative for congestion and sinus pressure.   Eyes: Negative for pain and visual disturbance.  Respiratory: Negative for cough, chest tightness and shortness of breath.   Cardiovascular: Negative for chest pain, palpitations and leg swelling.  Gastrointestinal: Negative for abdominal pain, diarrhea and nausea.  Genitourinary: Negative  for difficulty urinating and dysuria.  Musculoskeletal: Negative for joint swelling and myalgias.  Skin: Negative for color change and rash.  Neurological: Negative for dizziness, light-headedness and headaches.  Hematological: Negative for adenopathy. Does not bruise/bleed easily.  Psychiatric/Behavioral: Negative for agitation and dysphoric mood.       Objective:    Physical Exam Constitutional:      General: She is not in acute distress.    Appearance: Normal appearance. She is well-developed.  HENT:     Right Ear: External ear normal.     Left Ear: External ear normal.  Eyes:     General: No scleral icterus.       Right eye: No discharge.        Left eye: No discharge.     Conjunctiva/sclera: Conjunctivae normal.  Neck:     Musculoskeletal: Neck supple. No muscular tenderness.     Thyroid: No thyromegaly.  Cardiovascular:     Rate and Rhythm: Normal rate and regular rhythm.  Pulmonary:     Effort: No tachypnea, accessory muscle usage or respiratory distress.     Breath sounds: Normal breath sounds. No decreased breath sounds or wheezing.  Chest:     Breasts:        Right: No inverted nipple, mass, nipple discharge or tenderness (no axillary adenopathy).        Left: No inverted nipple, mass, nipple discharge or tenderness (no axilarry adenopathy).  Abdominal:     General: Bowel sounds are normal.     Palpations: Abdomen is soft.     Tenderness: There is no abdominal tenderness.  Genitourinary:    Comments: Normal external genitalia.  Vaginal vault without lesions.  Cervix identified.  Pap smear performed.  Could not appreciate any adnexal masses or tenderness.   Musculoskeletal:        General: No swelling or tenderness.  Lymphadenopathy:     Cervical: No cervical adenopathy.  Skin:    Findings: No erythema or rash.  Neurological:     Mental Status: She is alert and oriented to person, place, and time.  Psychiatric:        Mood and Affect: Mood normal.         Behavior: Behavior normal.     BP 130/78   Pulse 78   Temp 98.4 F (36.9 C) (Oral)   Resp 16   Ht 5' 10"  (1.778 m)   Wt 284 lb (128.8 kg)   LMP 12/08/2006   SpO2 98%   BMI 40.75 kg/m  Wt Readings from Last 3 Encounters:  06/24/19 284 lb (128.8 kg)  12/23/18 290 lb (131.5 kg)  06/19/18 276 lb 8 oz (125.4 kg)     Lab Results  Component Value Date   WBC 5.7 06/24/2019   HGB 13.2 06/24/2019   HCT 39.3 06/24/2019   PLT 267.0 06/24/2019   GLUCOSE 90 06/24/2019   CHOL 288 (H) 06/24/2019   TRIG 170.0 (H) 06/24/2019   HDL 37.70 (L) 06/24/2019   LDLDIRECT 218.0 06/18/2018   LDLCALC 216 (H) 06/24/2019   ALT 27 06/24/2019   AST 20 06/24/2019   NA 139 06/24/2019   K 4.1 06/24/2019   CL 101 06/24/2019   CREATININE 0.89 06/24/2019   BUN 10 06/24/2019   CO2 29 06/24/2019   TSH 3.51 06/24/2019   HGBA1C 5.7 06/24/2019    Mm Diag Breast Tomo Uni Left  Result Date: 10/01/2018 CLINICAL DATA:  48 year old female recalled from screening mammogram dated 09/16/2018 for a possible left breast asymmetry. Of note, the patient has a pimple along the upper aspect of the left breast which she states has since drained and improved since her screening mammogram. A radiopaque BB was placed at the site of the pimple for additional images were taken. EXAM: DIGITAL DIAGNOSTIC UNILATERAL LEFT MAMMOGRAM WITH CAD AND TOMO COMPARISON:  Previous exam(s). ACR Breast Density Category b: There are scattered areas of fibroglandular density. FINDINGS: Previously seen asymmetry in the superior left breast at middle depth on the MLO projection does not definitively persist on today's additional views. A radiopaque BB at the  site of the patient's pimple lies over the location of the previously seen asymmetry. No additional suspicious findings are identified. Mammographic images were processed with CAD. IMPRESSION: Benign left breast skin lesion corresponding with the screening mammographic findings. Recommendation is  for clinical and symptomatic follow-up. RECOMMENDATION: Screening mammogram in one year.(Code:SM-B-01Y) I have discussed the findings and recommendations with the patient. Results were also provided in writing at the conclusion of the visit. If applicable, a reminder letter will be sent to the patient regarding the next appointment. BI-RADS CATEGORY  2: Benign. Electronically Signed   By: Kristopher Oppenheim M.D.   On: 10/01/2018 11:40       Assessment & Plan:   Problem List Items Addressed This Visit    Anxiety, generalized    Currently stable.  Follow.  Has seen psychiatry.        Arnold-Chiari malformation (HCC)    S/p surgery.  Has seen neurology.  Discussed referral back given headaches.  She declines.  Wants to follow.  No acute issues.        Back pain    Being followed at pain clinic.        BMI 40.0-44.9, adult (HCC)    Discussed diet and exercise.  Follow.        GERD (gastroesophageal reflux disease)    Controlled.  Has seen GI previously.        Headache    Has seen Dr Manuella Ghazi.  On topamax.  Uses imitrex prn.  Discussed f/u.  She declines.  Feels stable.        Relevant Medications   gabapentin (NEURONTIN) 400 MG capsule   Health care maintenance    Physical today 06/25/19.  Mammogram 09/16/18 recommended f/u left breast.  F/u left breast mammogram 10/01/18 - birads II.  Colonoscopy 04/28/15.  She is s/p hysterectomy.  Cervix in place.  PAP performed today 06/25/19.        Hypercholesteremia   Hypercholesterolemia    Has been intolerant to some statin medications. Per note, was unable to afford crestor.  Low cholesterol diet and exercise.  Check lipid panel.        Relevant Orders   CBC with Differential/Platelet (Completed)   Comprehensive metabolic panel (Completed)   Lipid panel (Completed)   TSH (Completed)   Hyperglycemia    Low carb diet and exercise.  Follow met b and a1c.        Relevant Orders   Hemoglobin A1c (Completed)   Mild depression ([place])    Has  been followed by psychiatry.  Stable.        Stress    Overall handling things relatively well.  Discussed with her today.  Follow.         Other Visit Diagnoses    Cervical cancer screening    -  Primary   Relevant Orders   Cytology - PAP( [place])       [name], MD

## 2019-06-27 ENCOUNTER — Encounter: Payer: Self-pay | Admitting: Internal Medicine

## 2019-06-27 NOTE — Assessment & Plan Note (Signed)
Controlled.  Has seen GI previously.

## 2019-06-27 NOTE — Assessment & Plan Note (Signed)
Has seen Dr Manuella Ghazi.  On topamax.  Uses imitrex prn.  Discussed f/u.  She declines.  Feels stable.

## 2019-06-27 NOTE — Assessment & Plan Note (Signed)
Has been followed by psychiatry.  Stable.

## 2019-06-27 NOTE — Assessment & Plan Note (Signed)
Currently stable.  Follow.  Has seen psychiatry.

## 2019-06-27 NOTE — Assessment & Plan Note (Addendum)
Physical today 06/25/19.  Mammogram 09/16/18 recommended f/u left breast.  F/u left breast mammogram 10/01/18 - birads II.  Colonoscopy 04/28/15.  She is s/p hysterectomy.  Cervix in place.  PAP performed today 06/25/19.

## 2019-06-27 NOTE — Assessment & Plan Note (Signed)
Discussed diet and exercise.  Follow.  

## 2019-06-27 NOTE — Assessment & Plan Note (Signed)
Has been intolerant to some statin medications. Per note, was unable to afford crestor.  Low cholesterol diet and exercise.  Check lipid panel.

## 2019-06-27 NOTE — Assessment & Plan Note (Signed)
Being followed at pain clinic.

## 2019-06-27 NOTE — Assessment & Plan Note (Signed)
Overall handling things relatively well.  Discussed with her today.  Follow.

## 2019-06-27 NOTE — Assessment & Plan Note (Signed)
Low carb diet and exercise.  Follow met b and a1c.   

## 2019-06-27 NOTE — Assessment & Plan Note (Signed)
S/p surgery.  Has seen neurology.  Discussed referral back given headaches.  She declines.  Wants to follow.  No acute issues.

## 2019-06-28 LAB — CYTOLOGY - PAP
Diagnosis: NEGATIVE
HPV: NOT DETECTED

## 2019-06-29 ENCOUNTER — Encounter: Payer: Self-pay | Admitting: Internal Medicine

## 2019-07-05 ENCOUNTER — Other Ambulatory Visit: Payer: Self-pay

## 2019-07-05 MED ORDER — ATORVASTATIN CALCIUM 10 MG PO TABS: 10.0000 mg | ORAL_TABLET | Freq: Every day | ORAL | 0 refills | Status: DC

## 2019-08-09 ENCOUNTER — Other Ambulatory Visit: Payer: Medicaid Other

## 2019-09-10 ENCOUNTER — Encounter: Payer: Self-pay | Admitting: Internal Medicine

## 2019-09-11 NOTE — Telephone Encounter (Signed)
Kathleen Everett - can you help with this.    Thanks   Dr Nicki Reaper

## 2019-10-22 ENCOUNTER — Encounter: Payer: Self-pay | Admitting: Internal Medicine

## 2019-10-22 DIAGNOSIS — M25551 Pain in right hip: Secondary | ICD-10-CM

## 2019-10-22 NOTE — Telephone Encounter (Signed)
Are you ok with sending referral or do you prefer to do a visit first?

## 2019-10-23 NOTE — Telephone Encounter (Signed)
I have placed the order for the referral to ortho.  See her my chart message.  She already has an appt, just wanted me to place the order.  Let me know if I need to do anything more.

## 2019-11-30 ENCOUNTER — Other Ambulatory Visit: Payer: Self-pay | Admitting: Orthopedic Surgery

## 2019-12-02 ENCOUNTER — Other Ambulatory Visit: Payer: Self-pay

## 2019-12-02 ENCOUNTER — Encounter
Admission: RE | Admit: 2019-12-02 | Discharge: 2019-12-02 | Disposition: A | Discharge status: Home / Self Care | Payer: Medicare HMO | Source: Ambulatory Visit | Attending: Orthopedic Surgery | Admitting: Orthopedic Surgery

## 2019-12-02 HISTORY — DX: Other intervertebral disc degeneration, lumbar region without mention of lumbar back pain or lower extremity pain: M51.369

## 2019-12-02 HISTORY — DX: Diverticulitis of intestine, part unspecified, without perforation or abscess without bleeding: K57.92

## 2019-12-02 HISTORY — DX: Sleep apnea, unspecified: G47.30

## 2019-12-02 HISTORY — DX: Other intervertebral disc degeneration, lumbar region: M51.36

## 2019-12-02 HISTORY — DX: Other complications of anesthesia, initial encounter: T88.59XA

## 2019-12-02 HISTORY — DX: Nausea with vomiting, unspecified: R11.2

## 2019-12-02 HISTORY — DX: Other specified postprocedural states: Z98.890

## 2019-12-02 NOTE — Patient Instructions (Signed)
Your procedure is scheduled on: tues 1/19  Report to Day Surgery. [facility] To find out your arrival time please call [phone] between [place] on Mon. 1/18.  Remember: Instructions that are not followed completely may result in serious medical risk,  up to and including death, or upon the discretion of your surgeon and anesthesiologist your  surgery may need to be rescheduled.     _X__ 1. Do not eat food after midnight the night before your procedure.                 No gum chewing or hard candies. You may drink clear liquids up to 2 hours                 before you are scheduled to arrive for your surgery- DO not drink clear                 liquids within 2 hours of the start of your surgery.                 Clear Liquids include:  water, apple juice without pulp, clear carbohydrate                 drink such as Clearfast of Gatorade, Black Coffee or Tea (Do not add                 anything to coffee or tea).  __X__2.  On the morning of surgery brush your teeth with toothpaste and water, you                may rinse your mouth with mouthwash if you wish.  Do not swallow any toothpaste of mouthwash.     _X__ 3.  No Alcohol for 24 hours before or after surgery.   ___ 4.  Do Not Smoke or use e-cigarettes For 24 Hours Prior to Your Surgery.                 Do not use any chewable tobacco products for at least 6 hours prior to                 surgery.  ____  5.  Bring all medications with you on the day of surgery if instructed.   __x__  6.  Notify your doctor if there is any change in your medical condition      (cold, fever, infections).     Do not wear jewelry, make-up, hairpins, clips or nail polish. Do not wear lotions, powders, or perfumes. You may wear deodorant. Do not shave 48 hours prior to surgery. Men may shave face and neck. Do not bring valuables to the hospital.    [facility] is not responsible for any belongings or  valuables.  Contacts, dentures or bridgework may not be worn into surgery. Leave your suitcase in the car. After surgery it may be brought to your room. For patients admitted to the hospital, discharge time is determined by your treatment team.   Patients discharged the day of surgery will not be allowed to drive home.   Please read over the following fact sheets that you were given:     __x__ Take these medicines the morning of surgery with A SIP OF WATER:    1. acetaminophen (TYLENOL) 500 MG tablet if needed  2. diazepam (VALIUM) 5 MG tablet if needed  3. gabapentin (NEURONTIN) 400 MG capsule  4.omeprazole (PRILOSEC) 20 MG capsule  Dose  the night before and morning of surgery  5.Oxycodone HCl 10 MG TABS  6.  ____ Fleet Enema (as directed)   _x___ Use CHG Soap as directed  ____ Use inhalers on the day of surgery  ____ Stop metformin 2 days prior to surgery    ____ Take 1/2 of usual insulin dose the night before surgery. No insulin the morning          of surgery.   ____ Stop Coumadin/Plavix/aspirin on   _x___ Stop Anti-inflammatories  ibuprofen (ADVIL,MOTRIN) 800 MG tablet or aleve or aspirin.  May take tylenol   __x__ Stop supplements until after surgery.  Ascorbic Acid (VITAMIN C) 500 MG CHEW  ____ Bring C-Pap to the hospital.

## 2019-12-03 ENCOUNTER — Encounter
Admission: RE | Admit: 2019-12-03 | Discharge: 2019-12-03 | Disposition: A | Discharge status: Home / Self Care | Payer: Medicare HMO | Source: Ambulatory Visit | Attending: Orthopedic Surgery | Admitting: Orthopedic Surgery

## 2019-12-03 ENCOUNTER — Other Ambulatory Visit: Payer: Medicaid Other

## 2019-12-03 DIAGNOSIS — U071 COVID-19: Secondary | ICD-10-CM | POA: Diagnosis not present

## 2019-12-03 DIAGNOSIS — Z01818 Encounter for other preprocedural examination: Secondary | ICD-10-CM | POA: Diagnosis not present

## 2019-12-03 DIAGNOSIS — Z01810 Encounter for preprocedural cardiovascular examination: Secondary | ICD-10-CM | POA: Diagnosis not present

## 2019-12-03 LAB — CBC
HCT: 38.8 % (ref 36.0–46.0)
Hemoglobin: 12.9 g/dL (ref 12.0–15.0)
MCH: 29.2 pg (ref 26.0–34.0)
MCHC: 33.2 g/dL (ref 30.0–36.0)
MCV: 87.8 fL (ref 80.0–100.0)
Platelets: 183 10*3/uL (ref 150–400)
RBC: 4.42 MIL/uL (ref 3.87–5.11)
RDW: 13.1 % (ref 11.5–15.5)
WBC: 4.9 10*3/uL (ref 4.0–10.5)
nRBC: 0 % (ref 0.0–0.2)

## 2019-12-03 LAB — TYPE AND SCREEN
ABO/RH(D): A POS
Antibody Screen: NEGATIVE

## 2019-12-03 LAB — BASIC METABOLIC PANEL
Anion gap: 9 (ref 5–15)
BUN: 11 mg/dL (ref 6–20)
CO2: 26 mmol/L (ref 22–32)
Calcium: 9.3 mg/dL (ref 8.9–10.3)
Chloride: 103 mmol/L (ref 98–111)
Creatinine, Ser: 0.75 mg/dL (ref 0.44–1.00)
GFR calc Af Amer: >60 mL/min (ref >60)
GFR calc non Af Amer: >60 mL/min (ref >60)
Glucose, Bld: 105 mg/dL — ABNORMAL HIGH (ref 70–99)
Potassium: 3.9 mmol/L (ref 3.5–5.1)
Sodium: 138 mmol/L (ref 135–145)

## 2019-12-03 LAB — SEDIMENTATION RATE: Sed Rate: 42 mm/hr — ABNORMAL HIGH (ref 0–20)

## 2019-12-03 LAB — URINALYSIS, ROUTINE W REFLEX MICROSCOPIC
Bilirubin Urine: NEGATIVE
Glucose, UA: NEGATIVE mg/dL
Hgb urine dipstick: NEGATIVE
Ketones, ur: NEGATIVE mg/dL
Leukocytes,Ua: NEGATIVE
Nitrite: NEGATIVE
Protein, ur: NEGATIVE mg/dL
Specific Gravity, Urine: 1.005 (ref 1.005–1.030)
pH: 6 (ref 5.0–8.0)

## 2019-12-03 LAB — APTT: aPTT: 31 seconds (ref 24–36)

## 2019-12-03 LAB — SURGICAL PCR SCREEN
MRSA, PCR: NEGATIVE
Staphylococcus aureus: NEGATIVE

## 2019-12-03 LAB — PROTIME-INR
INR: 1.1 (ref 0.8–1.2)
Prothrombin Time: 13.9 seconds (ref 11.4–15.2)

## 2019-12-04 LAB — URINE CULTURE: Culture: NO GROWTH

## 2019-12-04 LAB — SARS CORONAVIRUS 2 (TAT 6-24 HRS): SARS Coronavirus 2: POSITIVE — AB

## 2019-12-05 ENCOUNTER — Telehealth: Payer: Self-pay | Admitting: Nurse Practitioner
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
## 2019-12-07 ENCOUNTER — Ambulatory Visit: Admission: RE | Admit: 2019-12-07 | Payer: Medicare HMO | Source: Home / Self Care | Admitting: Orthopedic Surgery

## 2019-12-20 DIAGNOSIS — H5213 Myopia, bilateral: Secondary | ICD-10-CM | POA: Diagnosis not present

## 2019-12-27 ENCOUNTER — Ambulatory Visit
Admission: RE | Admit: 2019-12-27 | Discharge: 2019-12-27 | Disposition: A | Discharge status: Home / Self Care | Payer: Medicare HMO | Source: Ambulatory Visit | Attending: Internal Medicine | Admitting: Internal Medicine

## 2019-12-27 ENCOUNTER — Ambulatory Visit (INDEPENDENT_AMBULATORY_CARE_PROVIDER_SITE_OTHER): Payer: Medicare HMO | Admitting: Internal Medicine

## 2019-12-27 ENCOUNTER — Other Ambulatory Visit: Payer: Self-pay

## 2019-12-27 VITALS — BP 128/86 | Ht 70.0 in | Wt 268.0 lb

## 2019-12-27 DIAGNOSIS — M25551 Pain in right hip: Secondary | ICD-10-CM

## 2019-12-27 DIAGNOSIS — E7800 Pure hypercholesterolemia, unspecified: Secondary | ICD-10-CM | POA: Diagnosis not present

## 2019-12-27 DIAGNOSIS — M545 Low back pain, unspecified: Secondary | ICD-10-CM

## 2019-12-27 DIAGNOSIS — G8929 Other chronic pain: Secondary | ICD-10-CM | POA: Diagnosis not present

## 2019-12-27 DIAGNOSIS — R05 Cough: Secondary | ICD-10-CM | POA: Diagnosis not present

## 2019-12-27 DIAGNOSIS — R059 Cough, unspecified: Secondary | ICD-10-CM

## 2019-12-27 DIAGNOSIS — Z1231 Encounter for screening mammogram for malignant neoplasm of breast: Secondary | ICD-10-CM

## 2019-12-27 DIAGNOSIS — F320 Major depressive disorder, single episode, mild: Secondary | ICD-10-CM | POA: Diagnosis not present

## 2019-12-27 DIAGNOSIS — R0602 Shortness of breath: Secondary | ICD-10-CM | POA: Diagnosis not present

## 2019-12-27 DIAGNOSIS — Z79899 Other long term (current) drug therapy: Secondary | ICD-10-CM | POA: Diagnosis not present

## 2019-12-27 DIAGNOSIS — F411 Generalized anxiety disorder: Secondary | ICD-10-CM | POA: Diagnosis not present

## 2019-12-27 DIAGNOSIS — K219 Gastro-esophageal reflux disease without esophagitis: Secondary | ICD-10-CM | POA: Diagnosis present

## 2019-12-27 DIAGNOSIS — Z882 Allergy status to sulfonamides status: Secondary | ICD-10-CM | POA: Diagnosis not present

## 2019-12-27 DIAGNOSIS — Q0700 Arnold-Chiari syndrome without spina bifida or hydrocephalus: Secondary | ICD-10-CM

## 2019-12-27 DIAGNOSIS — R739 Hyperglycemia, unspecified: Secondary | ICD-10-CM

## 2019-12-27 DIAGNOSIS — R509 Fever, unspecified: Secondary | ICD-10-CM | POA: Diagnosis not present

## 2019-12-27 DIAGNOSIS — D649 Anemia, unspecified: Secondary | ICD-10-CM

## 2019-12-27 DIAGNOSIS — G4730 Sleep apnea, unspecified: Secondary | ICD-10-CM | POA: Diagnosis present

## 2019-12-27 DIAGNOSIS — Z881 Allergy status to other antibiotic agents status: Secondary | ICD-10-CM | POA: Diagnosis not present

## 2019-12-27 DIAGNOSIS — Z880 Allergy status to penicillin: Secondary | ICD-10-CM | POA: Diagnosis not present

## 2019-12-27 DIAGNOSIS — R5082 Postprocedural fever: Secondary | ICD-10-CM | POA: Diagnosis not present

## 2019-12-27 DIAGNOSIS — R600 Localized edema: Secondary | ICD-10-CM | POA: Diagnosis not present

## 2019-12-27 DIAGNOSIS — Z96641 Presence of right artificial hip joint: Secondary | ICD-10-CM | POA: Diagnosis not present

## 2019-12-27 DIAGNOSIS — M1611 Unilateral primary osteoarthritis, right hip: Secondary | ICD-10-CM | POA: Diagnosis present

## 2019-12-27 DIAGNOSIS — F32A Depression, unspecified: Secondary | ICD-10-CM

## 2019-12-27 DIAGNOSIS — Z471 Aftercare following joint replacement surgery: Secondary | ICD-10-CM | POA: Diagnosis not present

## 2019-12-27 MED ORDER — CLINDAMYCIN PHOSPHATE 900 MG/50ML IV SOLN
900.0000 mg | Freq: Once | INTRAVENOUS | Status: AC
  Administered 2019-12-28: 08:00:00 900 mg via INTRAVENOUS

## 2019-12-27 NOTE — Progress Notes (Signed)
Virtual Visit via video Note  This visit type was conducted due to national recommendations for restrictions regarding the COVID-19 pandemic (e.g. social distancing).  This format is felt to be most appropriate for this patient at this time.  All issues noted in this document were discussed and addressed.  No physical exam was performed (except for noted visual exam findings with Video Visits).   I connected with Kathleen Everett by a video enabled telemedicine application and verified that I am speaking with the correct person using two identifiers. Location patient: home Location provider: work Persons participating in the virtual visit: patient, provider  The limitations, risks, security and privacy concerns of performing an evaluation and management service by video and the availability of in person appointments have been discussed. The patient expressed understanding and agreed to proceed.   Reason for visit: scheduled follow up.    HPI: Has had persistent right hip pain.  Limiting her ability to get around.  Planning for hip surgery tomorrow.  Tested positive for covid 12/03/19.  Feels better now.  Noticed some minimal sob and used albuterol on a couple of occasions.  Overall feels "pretty much back to baseline".  Pulse ox 98%.  Loss of taste and smell have returned.  No chest pain.  On omeprazole.  Controls acid reflux.  No abdominal pain or bowels change reported.  lood pressure ok (128/86).  Off lipitor.  Will hold on restarting until after surgery, etc.  Needs mammogram.  Following weight.  Increased stress.  Schedule with psychiatry.     ROS: See pertinent positives and negatives per HPI.  Past Medical History:  Diagnosis Date  . Back pain   . Chiari malformation    s/p sgy decompression  . Chronic tension headaches   . Complication of anesthesia   . DDD (degenerative disc disease), lumbar   . Depression   . Diverticulitis   . Dysmenorrhea   . Endometriosis   . Gas pain    . GERD (gastroesophageal reflux disease)   . Hidradenitis   . Hyperlipidemia   . Obesity   . Osteoarthritis   . Ovarian cyst    hx  . PONV (postoperative nausea and vomiting)   . Sleep apnea    central sleep apnea  No CPAP right now  . Vitamin B12 deficiency    On injections    Past Surgical History:  Procedure Laterality Date  . ABDOMINAL HYSTERECTOMY  03/27/07   laparoscopic  . BREAST EXCISIONAL BIOPSY Left 2004   fibroadenoma  . CESAREAN SECTION     1999/2004 twins  . chiari decompression    . COLONOSCOPY WITH PROPOFOL N/A 04/28/2015   Procedure: COLONOSCOPY WITH PROPOFOL;  Surgeon: Manya Silvas, MD;  Location: [facility] ENDOSCOPY;  Service: Endoscopy;  Laterality: N/A;  . CYSTOSCOPY     bladder ulcers  . ESOPHAGOGASTRODUODENOSCOPY N/A 04/28/2015   Procedure: ESOPHAGOGASTRODUODENOSCOPY (EGD);  Surgeon: Manya Silvas, MD;  Location: [facility] ENDOSCOPY;  Service: Endoscopy;  Laterality: N/A;  . HERNIA REPAIR Right 2003   inguinal  . OVARIAN CYST REMOVAL  2001   right  . TEMPOROMANDIBULAR JOINT SURGERY  2000  . TOTAL HIP ARTHROPLASTY Right 12/28/2019   Procedure: TOTAL HIP ARTHROPLASTY ANTERIOR APPROACH;  Surgeon: Hessie Knows, MD;  Location: [facility] ORS;  Service: Orthopedics;  Laterality: Right;  . TUBAL LIGATION     Bilateral    Family History  Problem Relation Age of Onset  . Hyperlipidemia Mother   . Anxiety disorder Mother   .  Atrial fibrillation Mother   . Hypertension Mother   . Anxiety disorder Father   . Stroke Father   . Hyperlipidemia Father   . Hypertension Father   . Congestive Heart Failure Father   . Depression Father   . Hyperlipidemia Brother   . Colon cancer Paternal Aunt   . Dementia Maternal Grandmother   . Dementia Paternal Grandmother   . Breast cancer Neg Hx     SOCIAL HX: reviewed.    Current Outpatient Medications:  .  acetaminophen (TYLENOL) 500 MG tablet, Take 500-1,000 mg by mouth every 6 (six) hours as needed (for pain.). , Disp:  , Rfl:  .  Ascorbic Acid (VITAMIN C) 500 MG CHEW, Chew 500 mg by mouth daily., Disp: , Rfl:  .  azithromycin (ZITHROMAX) 250 MG tablet, Take 1 tablet daily x 4 days, Disp: 4 tablet, Rfl: 0 .  calcium carbonate (TUMS - DOSED IN MG ELEMENTAL CALCIUM) 500 MG chewable tablet, Chew 1-2 tablets by mouth 3 (three) times daily as needed for indigestion or heartburn., Disp: , Rfl:  .  cyanocobalamin (,VITAMIN B-12,) 1000 MCG/ML injection, INJECT ONE ML (CC) INTRAMUSCULARLY ONCE EVERY MONTH (Patient taking differently: Inject 1,000 mcg into the muscle every 30 (thirty) days. ), Disp: 10 mL, Rfl: 1 .  diazepam (VALIUM) 5 MG tablet, Take 1 tablet (5 mg total) by mouth every 8 (eight) hours as needed for muscle spasms. (Patient taking differently: Take 2.5-5 mg by mouth daily as needed for muscle spasms. ), Disp: 20 tablet, Rfl: 0 .  diclofenac sodium (VOLTAREN) 1 % GEL, Apply 1 application topically 4 (four) times daily as needed (pain.). , Disp: , Rfl:  .  docusate sodium (COLACE) 100 MG capsule, Take 1 capsule (100 mg total) by mouth 2 (two) times daily., Disp: 20 capsule, Rfl: 0 .  enoxaparin (LOVENOX) 40 MG/0.4ML injection, Inject 0.4 mLs (40 mg total) into the skin daily for 14 days., Disp: 5.6 mL, Rfl: 0 .  gabapentin (NEURONTIN) 400 MG capsule, Take 400 mg by mouth 3 (three) times daily., Disp: , Rfl:  .  lactase (RA DAIRY AID) 3000 units tablet, Take 3,000-6,000 Units by mouth 3 (three) times daily as needed (wit dairy consumption)., Disp: , Rfl:  .  methocarbamol (ROBAXIN) 500 MG tablet, Take 1 tablet (500 mg total) by mouth every 6 (six) hours as needed for muscle spasms., Disp: 30 tablet, Rfl: 0 .  nystatin cream (MYCOSTATIN), APPLY TOPICALLY TWICE DAILY (Patient taking differently: Apply 1 application topically 2 (two) times daily as needed (heat rash). ), Disp: 30 g, Rfl: 0 .  omeprazole (PRILOSEC) 20 MG capsule, Take 20 mg by mouth daily., Disp: , Rfl:  .  oxyCODONE (ROXICODONE) 5 MG immediate  release tablet, Take 2-3 tablets (10-15 mg total) by mouth every 6 (six) hours as needed for severe pain., Disp: 30 tablet, Rfl: 0 .  Oxycodone HCl 10 MG TABS, Take 10 mg by mouth 5 (five) times daily. , Disp: , Rfl: 0 .  simethicone (GAS RELIEF) 80 MG chewable tablet, Chew 80 mg by mouth every 6 (six) hours as needed for flatulence., Disp: , Rfl:  .  TOPAMAX 25 MG tablet, Take 25 mg by mouth at bedtime., Disp: , Rfl:   EXAM:  VITALS per patient if applicable:  784/69  GENERAL: alert, oriented, appears well and in no acute distress  HEENT: atraumatic, conjunttiva clear, no obvious abnormalities on inspection of external nose and ears  NECK: normal movements of the head and  neck  LUNGS: on inspection no signs of respiratory distress, breathing rate appears normal, no obvious gross SOB, gasping or wheezing  CV: no obvious cyanosis  PSYCH/NEURO: pleasant and cooperative, no obvious depression or anxiety, speech and thought processing grossly intact  ASSESSMENT AND PLAN:  Discussed the following assessment and plan:  Anxiety, generalized Has seen psychiatry.  Will need to arrange f/u with psychiatry.   Arnold-Chiari malformation (HCC) S/p surgery.  Has seen neurology.  Have discussed referral back.  Has wanted to postpone previously until insurance straightened out.  Has surgery planned for tomorrow.  Will notify me when agreeable.   Back pain Has been followed at pain clinic.   GERD (gastroesophageal reflux disease) Controlled.  On omeprazole.   Hypercholesteremia Off lipitor currently.  Low cholesterol diet.  Follow lipid panel.  Will plan for recheck. If persistent elevation and once past surgery, may need to restart statin medication.   Hyperglycemia Low carb diet and exercise.  Follow met b and a1c.   Mild depression ([place]) Schedule f/u with psychiatry.   Right hip pain Persistent. Planning for hip surgery tomorrow.  Denies any chest pain.  Had covid.  Minimal sob, but  breathing overall stable.  Will check cxr to confirm clear prior to surgery.  Feels may be related to deconditioning.    Breast cancer screening Will need mammogram when able.    Anemia Check a f/u cbc.  Will check iron studies and B12 as well.    Orders Placed This Encounter  Procedures  . DG Chest 2 View    Standing Status:   Future    Number of Occurrences:   1    Standing Expiration Date:   02/23/2021    Order Specific Question:   Reason for Exam (SYMPTOM  OR DIAGNOSIS REQUIRED)    Answer:   history of covid 12/03/19.  with some residual cough.  pre op cxr    Order Specific Question:   Is patient pregnant?    Answer:   No    Order Specific Question:   Preferred imaging location?    Answer:   [facility]    Order Specific Question:   Radiology Contrast Protocol - do NOT remove file path    Answer:   [email or web address]  . CBC with Differential/Platelet    Standing Status:   Future    Standing Expiration Date:   01/01/2021  . Hepatic function panel    Standing Status:   Future    Standing Expiration Date:   01/01/2021  . Lipid panel    Standing Status:   Future    Standing Expiration Date:   01/01/2021  . Basic metabolic panel    Standing Status:   Future    Standing Expiration Date:   01/01/2021  . Hemoglobin A1c    Standing Status:   Future    Standing Expiration Date:   01/01/2021  . IBC + Ferritin    Standing Status:   Future    Standing Expiration Date:   01/01/2021  . Vitamin B12    Standing Status:   Future    Standing Expiration Date:   01/01/2021  . Ambulatory referral to Psychiatry    Referral Priority:   Routine    Referral Type:   Psychiatric    Referral Reason:   Specialty Services Required    Requested Specialty:   Psychiatry    Number of Visits Requested:   1     I discussed the assessment and treatment  plan with the patient. The patient was provided an opportunity to ask questions and all were answered. The  patient agreed with the plan and demonstrated an understanding of the instructions.   The patient was advised to call back or seek an in-person evaluation if the symptoms worsen or if the condition fails to improve as anticipated.   [name], MD Patient ID: Kathleen Everett, female   DOB: June 10, 1971, 49 y.o.   MRN: [id number]

## 2019-12-28 ENCOUNTER — Telehealth: Payer: Self-pay | Admitting: Internal Medicine

## 2019-12-28 ENCOUNTER — Ambulatory Visit: Payer: Medicare HMO | Admitting: Anesthesiology

## 2019-12-28 ENCOUNTER — Ambulatory Visit: Payer: Medicare HMO

## 2019-12-28 ENCOUNTER — Encounter: Payer: Self-pay | Admitting: Orthopedic Surgery

## 2019-12-28 ENCOUNTER — Encounter: Admission: RE | Payer: Self-pay | Source: Home / Self Care

## 2019-12-28 ENCOUNTER — Inpatient Hospital Stay
Admission: AD | Admit: 2019-12-28 | Discharge: 2020-01-01 | DRG: 470 | Disposition: A | Discharge status: Home Health | Payer: Medicare HMO | Attending: Orthopedic Surgery | Admitting: Orthopedic Surgery

## 2019-12-28 ENCOUNTER — Inpatient Hospital Stay: Payer: Medicare HMO

## 2019-12-28 ENCOUNTER — Encounter
Admission: AD | Disposition: A | Discharge status: Home / Self Care | Payer: Self-pay | Source: Home / Self Care | Attending: Orthopedic Surgery

## 2019-12-28 DIAGNOSIS — K219 Gastro-esophageal reflux disease without esophagitis: Secondary | ICD-10-CM | POA: Diagnosis present

## 2019-12-28 DIAGNOSIS — Z79899 Other long term (current) drug therapy: Secondary | ICD-10-CM

## 2019-12-28 DIAGNOSIS — R5082 Postprocedural fever: Secondary | ICD-10-CM | POA: Diagnosis not present

## 2019-12-28 DIAGNOSIS — Z471 Aftercare following joint replacement surgery: Secondary | ICD-10-CM | POA: Diagnosis not present

## 2019-12-28 DIAGNOSIS — Z96641 Presence of right artificial hip joint: Secondary | ICD-10-CM

## 2019-12-28 DIAGNOSIS — Z419 Encounter for procedure for purposes other than remedying health state, unspecified: Secondary | ICD-10-CM

## 2019-12-28 DIAGNOSIS — R52 Pain, unspecified: Secondary | ICD-10-CM

## 2019-12-28 DIAGNOSIS — G4730 Sleep apnea, unspecified: Secondary | ICD-10-CM | POA: Diagnosis present

## 2019-12-28 DIAGNOSIS — Z881 Allergy status to other antibiotic agents status: Secondary | ICD-10-CM

## 2019-12-28 DIAGNOSIS — Z880 Allergy status to penicillin: Secondary | ICD-10-CM

## 2019-12-28 DIAGNOSIS — Z882 Allergy status to sulfonamides status: Secondary | ICD-10-CM | POA: Diagnosis not present

## 2019-12-28 DIAGNOSIS — M1611 Unilateral primary osteoarthritis, right hip: Principal | ICD-10-CM | POA: Diagnosis present

## 2019-12-28 DIAGNOSIS — G8918 Other acute postprocedural pain: Secondary | ICD-10-CM

## 2019-12-28 DIAGNOSIS — R509 Fever, unspecified: Secondary | ICD-10-CM

## 2019-12-28 HISTORY — DX: Presence of right artificial hip joint: Z96.641

## 2019-12-28 HISTORY — PX: TOTAL HIP ARTHROPLASTY: SHX124

## 2019-12-28 LAB — CBC
HCT: 35.5 % — ABNORMAL LOW (ref 36.0–46.0)
Hemoglobin: 11.7 g/dL — ABNORMAL LOW (ref 12.0–15.0)
MCH: 29 pg (ref 26.0–34.0)
MCHC: 33 g/dL (ref 30.0–36.0)
MCV: 88.1 fL (ref 80.0–100.0)
Platelets: 232 10*3/uL (ref 150–400)
RBC: 4.03 MIL/uL (ref 3.87–5.11)
RDW: 12.9 % (ref 11.5–15.5)
WBC: 8.2 10*3/uL (ref 4.0–10.5)
nRBC: 0 % (ref 0.0–0.2)

## 2019-12-28 LAB — CREATININE, SERUM
Creatinine, Ser: 0.71 mg/dL (ref 0.44–1.00)
GFR calc Af Amer: >60 mL/min (ref >60)
GFR calc non Af Amer: >60 mL/min (ref >60)

## 2019-12-28 LAB — TYPE AND SCREEN
ABO/RH(D): A POS
Antibody Screen: NEGATIVE

## 2019-12-28 SURGERY — ARTHROPLASTY, HIP, TOTAL, ANTERIOR APPROACH
Anesthesia: Choice | Site: Hip | Laterality: Right

## 2019-12-28 SURGERY — ARTHROPLASTY, HIP, TOTAL, ANTERIOR APPROACH
Anesthesia: Spinal | Site: Hip | Laterality: Right

## 2019-12-28 MED ORDER — SIMETHICONE 80 MG PO CHEW
80.0000 mg | CHEWABLE_TABLET | Freq: Four times a day (QID) | ORAL | Status: DC | PRN
  Filled 2019-12-28: qty 1

## 2019-12-28 MED ORDER — CLINDAMYCIN PHOSPHATE 900 MG/50ML IV SOLN
900.0000 mg | Freq: Four times a day (QID) | INTRAVENOUS | Status: AC
  Administered 2019-12-28 – 2019-12-29 (×3): 900 mg via INTRAVENOUS
  Filled 2019-12-28 (×3): qty 50

## 2019-12-28 MED ORDER — ZOLPIDEM TARTRATE 5 MG PO TABS: 5.0000 mg | ORAL_TABLET | Freq: Every evening | ORAL | Status: DC | PRN

## 2019-12-28 MED ORDER — DEXMEDETOMIDINE HCL IN NACL 200 MCG/50ML IV SOLN
INTRAVENOUS | Status: DC | PRN
  Administered 2019-12-28: 20 ug via INTRAVENOUS

## 2019-12-28 MED ORDER — LACTASE 3000 UNITS PO TABS
3000.0000 [IU] | ORAL_TABLET | Freq: Three times a day (TID) | ORAL | Status: DC | PRN
  Filled 2019-12-28: qty 1

## 2019-12-28 MED ORDER — DIAZEPAM 5 MG PO TABS
5.0000 mg | ORAL_TABLET | Freq: Three times a day (TID) | ORAL | Status: DC | PRN
  Administered 2019-12-28: 5 mg via ORAL
  Filled 2019-12-28: qty 1

## 2019-12-28 MED ORDER — ACETAMINOPHEN 500 MG PO TABS
1000.0000 mg | ORAL_TABLET | Freq: Four times a day (QID) | ORAL | Status: AC
  Administered 2019-12-28 – 2019-12-29 (×4): 1000 mg via ORAL
  Filled 2019-12-28 (×4): qty 2

## 2019-12-28 MED ORDER — HEPARIN SODIUM (PORCINE) 10000 UNIT/ML IJ SOLN
INTRAMUSCULAR | Status: AC
  Filled 2019-12-28: qty 3

## 2019-12-28 MED ORDER — FENTANYL CITRATE (PF) 100 MCG/2ML IJ SOLN
INTRAMUSCULAR | Status: DC | PRN
  Administered 2019-12-28 (×2): 50 ug via INTRAVENOUS

## 2019-12-28 MED ORDER — DEXMEDETOMIDINE HCL IN NACL 80 MCG/20ML IV SOLN
INTRAVENOUS | Status: AC
  Filled 2019-12-28: qty 20

## 2019-12-28 MED ORDER — ONDANSETRON HCL 4 MG/2ML IJ SOLN: 4.0000 mg | Freq: Four times a day (QID) | INTRAMUSCULAR | Status: DC | PRN

## 2019-12-28 MED ORDER — MIDAZOLAM HCL 2 MG/2ML IJ SOLN
INTRAMUSCULAR | Status: AC
  Filled 2019-12-28: qty 2

## 2019-12-28 MED ORDER — TRANEXAMIC ACID-NACL 1000-0.7 MG/100ML-% IV SOLN
INTRAVENOUS | Status: AC
  Filled 2019-12-28: qty 100

## 2019-12-28 MED ORDER — PHENOL 1.4 % MT LIQD
1.0000 | OROMUCOSAL | Status: DC | PRN
  Filled 2019-12-28: qty 177

## 2019-12-28 MED ORDER — KETAMINE HCL 50 MG/ML IJ SOLN
INTRAMUSCULAR | Status: AC
  Filled 2019-12-28: qty 10

## 2019-12-28 MED ORDER — GLYCOPYRROLATE 0.2 MG/ML IJ SOLN
INTRAMUSCULAR | Status: DC | PRN
  Administered 2019-12-28: .2 mg via INTRAVENOUS

## 2019-12-28 MED ORDER — PANTOPRAZOLE SODIUM 40 MG PO TBEC
80.0000 mg | DELAYED_RELEASE_TABLET | Freq: Every day | ORAL | Status: DC
  Administered 2019-12-29 – 2020-01-01 (×4): 80 mg via ORAL
  Filled 2019-12-28 (×4): qty 2

## 2019-12-28 MED ORDER — OXYCODONE HCL 5 MG PO TABS
10.0000 mg | ORAL_TABLET | Freq: Every day | ORAL | Status: DC
  Administered 2019-12-28 – 2020-01-01 (×20): 10 mg via ORAL
  Filled 2019-12-28 (×20): qty 2

## 2019-12-28 MED ORDER — PHENYLEPHRINE HCL (PRESSORS) 10 MG/ML IV SOLN
INTRAVENOUS | Status: DC | PRN
  Administered 2019-12-28 (×10): 100 ug via INTRAVENOUS
  Administered 2019-12-28 (×2): 50 ug via INTRAVENOUS
  Administered 2019-12-28 (×5): 100 ug via INTRAVENOUS

## 2019-12-28 MED ORDER — PROPOFOL 10 MG/ML IV BOLUS
INTRAVENOUS | Status: DC | PRN
  Administered 2019-12-28: 50 mg via INTRAVENOUS

## 2019-12-28 MED ORDER — NEOMYCIN-POLYMYXIN B GU 40-200000 IR SOLN
Status: AC
  Filled 2019-12-28: qty 20

## 2019-12-28 MED ORDER — DOCUSATE SODIUM 100 MG PO CAPS
100.0000 mg | ORAL_CAPSULE | Freq: Two times a day (BID) | ORAL | Status: DC
  Administered 2019-12-28 – 2019-12-31 (×7): 100 mg via ORAL
  Filled 2019-12-28 (×8): qty 1

## 2019-12-28 MED ORDER — ENOXAPARIN SODIUM 40 MG/0.4ML ~~LOC~~ SOLN
40.0000 mg | SUBCUTANEOUS | Status: DC
  Administered 2019-12-29 – 2020-01-01 (×4): 40 mg via SUBCUTANEOUS
  Filled 2019-12-28 (×4): qty 0.4

## 2019-12-28 MED ORDER — PROPOFOL 500 MG/50ML IV EMUL
INTRAVENOUS | Status: AC
  Filled 2019-12-28: qty 50

## 2019-12-28 MED ORDER — SODIUM CHLORIDE 0.9 % IV SOLN
INTRAVENOUS | Status: DC | PRN
  Administered 2019-12-28: 08:00:00 1000 mL

## 2019-12-28 MED ORDER — LACTATED RINGERS IV SOLN: INTRAVENOUS | Status: DC

## 2019-12-28 MED ORDER — BISACODYL 10 MG RE SUPP: 10.0000 mg | Freq: Every day | RECTAL | Status: DC | PRN

## 2019-12-28 MED ORDER — BUPIVACAINE HCL (PF) 0.5 % IJ SOLN
INTRAMUSCULAR | Status: DC | PRN
  Administered 2019-12-28: 3 mL via INTRATHECAL

## 2019-12-28 MED ORDER — SODIUM CHLORIDE (PF) 0.9 % IJ SOLN
INTRAMUSCULAR | Status: DC | PRN
  Administered 2019-12-28: 4 mL

## 2019-12-28 MED ORDER — METHOCARBAMOL 1000 MG/10ML IJ SOLN
500.0000 mg | Freq: Four times a day (QID) | INTRAVENOUS | Status: DC | PRN
  Filled 2019-12-28: qty 5

## 2019-12-28 MED ORDER — FAMOTIDINE 20 MG PO TABS: 20.0000 mg | ORAL_TABLET | Freq: Once | ORAL | Status: DC

## 2019-12-28 MED ORDER — BUPIVACAINE-EPINEPHRINE 0.25% -1:200000 IJ SOLN
INTRAMUSCULAR | Status: DC | PRN
  Administered 2019-12-28: 30 mL

## 2019-12-28 MED ORDER — MAGNESIUM CITRATE PO SOLN
1.0000 | Freq: Once | ORAL | Status: DC | PRN
  Filled 2019-12-28: qty 296

## 2019-12-28 MED ORDER — KETAMINE HCL 50 MG/ML IJ SOLN
INTRAMUSCULAR | Status: DC | PRN
  Administered 2019-12-28 (×2): 50 mg via INTRAVENOUS

## 2019-12-28 MED ORDER — METOCLOPRAMIDE HCL 5 MG/ML IJ SOLN: 5.0000 mg | Freq: Three times a day (TID) | INTRAMUSCULAR | Status: DC | PRN

## 2019-12-28 MED ORDER — BUPIVACAINE HCL (PF) 0.5 % IJ SOLN
INTRAMUSCULAR | Status: AC
  Filled 2019-12-28: qty 10

## 2019-12-28 MED ORDER — LIDOCAINE HCL (PF) 2 % IJ SOLN
INTRAMUSCULAR | Status: AC
  Filled 2019-12-28: qty 10

## 2019-12-28 MED ORDER — MAGNESIUM HYDROXIDE 400 MG/5ML PO SUSP
30.0000 mL | Freq: Every day | ORAL | Status: DC | PRN
  Filled 2019-12-28: qty 30

## 2019-12-28 MED ORDER — METOCLOPRAMIDE HCL 10 MG PO TABS: 5.0000 mg | ORAL_TABLET | Freq: Three times a day (TID) | ORAL | Status: DC | PRN

## 2019-12-28 MED ORDER — HYDROMORPHONE HCL 1 MG/ML IJ SOLN
0.5000 mg | INTRAMUSCULAR | Status: DC | PRN
  Administered 2019-12-28 – 2019-12-31 (×5): 1 mg via INTRAVENOUS
  Filled 2019-12-28 (×5): qty 1

## 2019-12-28 MED ORDER — FAMOTIDINE 20 MG PO TABS
ORAL_TABLET | ORAL | Status: AC
  Filled 2019-12-28: qty 1

## 2019-12-28 MED ORDER — METHOCARBAMOL 500 MG PO TABS
500.0000 mg | ORAL_TABLET | Freq: Four times a day (QID) | ORAL | Status: DC | PRN
  Administered 2019-12-30 – 2020-01-01 (×4): 500 mg via ORAL
  Filled 2019-12-28 (×4): qty 1

## 2019-12-28 MED ORDER — SODIUM CHLORIDE 0.9 % IV SOLN
INTRAVENOUS | Status: DC | PRN
  Administered 2019-12-28: 09:00:00 60 mL

## 2019-12-28 MED ORDER — ONDANSETRON HCL 4 MG/2ML IJ SOLN
INTRAMUSCULAR | Status: AC
  Filled 2019-12-28: qty 2

## 2019-12-28 MED ORDER — ONDANSETRON HCL 4 MG PO TABS
4.0000 mg | ORAL_TABLET | Freq: Four times a day (QID) | ORAL | Status: DC | PRN
  Filled 2019-12-28: qty 1

## 2019-12-28 MED ORDER — SODIUM CHLORIDE FLUSH 0.9 % IV SOLN
INTRAVENOUS | Status: AC
  Filled 2019-12-28: qty 10

## 2019-12-28 MED ORDER — ASCORBIC ACID 500 MG PO TABS
500.0000 mg | ORAL_TABLET | Freq: Every day | ORAL | Status: DC
  Administered 2019-12-29 – 2020-01-01 (×4): 500 mg via ORAL
  Filled 2019-12-28 (×4): qty 1

## 2019-12-28 MED ORDER — FENTANYL CITRATE (PF) 100 MCG/2ML IJ SOLN: 25.0000 ug | INTRAMUSCULAR | Status: DC | PRN

## 2019-12-28 MED ORDER — TOPIRAMATE 25 MG PO TABS
25.0000 mg | ORAL_TABLET | Freq: Every day | ORAL | Status: DC
  Administered 2019-12-30 – 2019-12-31 (×2): 25 mg via ORAL
  Filled 2019-12-28 (×5): qty 1

## 2019-12-28 MED ORDER — PROPOFOL 500 MG/50ML IV EMUL
INTRAVENOUS | Status: DC | PRN
  Administered 2019-12-28: 50 ug/kg/min via INTRAVENOUS

## 2019-12-28 MED ORDER — GLYCOPYRROLATE 0.2 MG/ML IJ SOLN
INTRAMUSCULAR | Status: AC
  Filled 2019-12-28: qty 1

## 2019-12-28 MED ORDER — HEMOSTATIC AGENTS (NO CHARGE) OPTIME
TOPICAL | Status: DC | PRN
  Administered 2019-12-28: 2 via TOPICAL

## 2019-12-28 MED ORDER — CLINDAMYCIN PHOSPHATE 900 MG/50ML IV SOLN
INTRAVENOUS | Status: AC
  Filled 2019-12-28: qty 50

## 2019-12-28 MED ORDER — BUPIVACAINE HCL (PF) 0.25 % IJ SOLN
INTRAMUSCULAR | Status: AC
  Filled 2019-12-28: qty 30

## 2019-12-28 MED ORDER — ONDANSETRON HCL 4 MG/2ML IJ SOLN
INTRAMUSCULAR | Status: DC | PRN
  Administered 2019-12-28: 4 mg via INTRAVENOUS

## 2019-12-28 MED ORDER — NEOMYCIN-POLYMYXIN B GU 40-200000 IR SOLN: Status: DC | PRN

## 2019-12-28 MED ORDER — OXYCODONE HCL 5 MG PO TABS
5.0000 mg | ORAL_TABLET | ORAL | Status: DC | PRN
  Administered 2019-12-28 – 2020-01-01 (×3): 10 mg via ORAL
  Filled 2019-12-28 (×6): qty 2

## 2019-12-28 MED ORDER — OXYCODONE HCL 5 MG PO TABS
10.0000 mg | ORAL_TABLET | ORAL | Status: DC | PRN
  Administered 2019-12-28: 15 mg via ORAL
  Administered 2019-12-29: 10 mg via ORAL
  Administered 2019-12-29 – 2019-12-30 (×2): 15 mg via ORAL
  Administered 2019-12-31: 10 mg via ORAL
  Filled 2019-12-28 (×3): qty 3

## 2019-12-28 MED ORDER — DIPHENHYDRAMINE HCL 12.5 MG/5ML PO ELIX: 12.5000 mg | ORAL_SOLUTION | ORAL | Status: DC | PRN

## 2019-12-28 MED ORDER — BUPIVACAINE LIPOSOME 1.3 % IJ SUSP
INTRAMUSCULAR | Status: AC
  Filled 2019-12-28: qty 20

## 2019-12-28 MED ORDER — OXYCODONE HCL 5 MG PO TABS: 5.0000 mg | ORAL_TABLET | Freq: Once | ORAL | Status: DC | PRN

## 2019-12-28 MED ORDER — LIDOCAINE HCL (PF) 2 % IJ SOLN
INTRAMUSCULAR | Status: DC | PRN
  Administered 2019-12-28: 50 mg

## 2019-12-28 MED ORDER — MENTHOL 3 MG MT LOZG
1.0000 | LOZENGE | OROMUCOSAL | Status: DC | PRN
  Filled 2019-12-28: qty 9

## 2019-12-28 MED ORDER — FENTANYL CITRATE (PF) 100 MCG/2ML IJ SOLN
INTRAMUSCULAR | Status: AC
  Filled 2019-12-28: qty 2

## 2019-12-28 MED ORDER — TRANEXAMIC ACID-NACL 1000-0.7 MG/100ML-% IV SOLN
INTRAVENOUS | Status: DC | PRN
  Administered 2019-12-28: 1000 mg via INTRAVENOUS

## 2019-12-28 MED ORDER — ACETAMINOPHEN 325 MG PO TABS
325.0000 mg | ORAL_TABLET | Freq: Four times a day (QID) | ORAL | Status: DC | PRN
  Administered 2019-12-29 – 2020-01-01 (×9): 650 mg via ORAL
  Filled 2019-12-28 (×9): qty 2

## 2019-12-28 MED ORDER — OXYCODONE HCL 5 MG/5ML PO SOLN: 5.0000 mg | Freq: Once | ORAL | Status: DC | PRN

## 2019-12-28 MED ORDER — CALCIUM CARBONATE ANTACID 500 MG PO CHEW: 1.0000 | CHEWABLE_TABLET | Freq: Three times a day (TID) | ORAL | Status: DC | PRN

## 2019-12-28 MED ORDER — GABAPENTIN 400 MG PO CAPS
400.0000 mg | ORAL_CAPSULE | Freq: Three times a day (TID) | ORAL | Status: DC
  Administered 2019-12-28 – 2020-01-01 (×14): 400 mg via ORAL
  Filled 2019-12-28 (×14): qty 1

## 2019-12-28 MED ORDER — MIDAZOLAM HCL 5 MG/5ML IJ SOLN
INTRAMUSCULAR | Status: DC | PRN
  Administered 2019-12-28 (×2): 2 mg via INTRAVENOUS

## 2019-12-28 SURGICAL SUPPLY — 70 items
APL PRP STRL LF DISP 70% ISPRP (MISCELLANEOUS) ×1
BLADE SAGITTAL AGGR TOOTH XLG (BLADE) ×3 IMPLANT
BNDG COHESIVE 6X5 TAN STRL LF (GAUZE/BANDAGES/DRESSINGS) ×9 IMPLANT
CANISTER SUCT 1200ML W/VALVE (MISCELLANEOUS) ×3 IMPLANT
CANISTER WOUND CARE 500ML ATS (WOUND CARE) ×3 IMPLANT
CELL SAVER COLL SVCS (MISCELLANEOUS) ×3
CELL SAVER FILTER LIPID PALL S (MISCELLANEOUS) ×3
CELL SAVER QUALITY CONTROL/COU (MISCELLANEOUS) ×3
CHLORAPREP W/TINT 26 (MISCELLANEOUS) ×3 IMPLANT
COVER BACK TABLE REUSABLE LG (DRAPES) ×3 IMPLANT
COVER WAND RF STERILE (DRAPES) ×3 IMPLANT
DRAPE 3/4 80X56 (DRAPES) ×9 IMPLANT
DRAPE C-ARM XRAY 36X54 (DRAPES) ×3 IMPLANT
DRAPE INCISE IOBAN 66X60 STRL (DRAPES) IMPLANT
DRAPE POUCH INSTRU U-SHP 10X18 (DRAPES) ×3 IMPLANT
DRESSING SURGICEL FIBRLLR 1X2 (HEMOSTASIS) ×2 IMPLANT
DRSG OPSITE POSTOP 4X8 (GAUZE/BANDAGES/DRESSINGS) ×2 IMPLANT
DRSG SURGICEL FIBRILLAR 1X2 (HEMOSTASIS) ×6
ELECT BLADE 6.5 EXT (BLADE) ×3 IMPLANT
ELECT REM PT RETURN 9FT ADLT (ELECTROSURGICAL) ×3
ELECTRODE REM PT RTRN 9FT ADLT (ELECTROSURGICAL) ×1 IMPLANT
FILTER LIPID PALL S CELL SAVER (MISCELLANEOUS) IMPLANT
GLOVE BIOGEL PI IND STRL 9 (GLOVE) ×1 IMPLANT
GLOVE BIOGEL PI INDICATOR 9 (GLOVE) ×2
GLOVE SURG SYN 9.0  PF PI (GLOVE) ×4
GLOVE SURG SYN 9.0 PF PI (GLOVE) ×2 IMPLANT
GOWN SRG 2XL LVL 4 RGLN SLV (GOWNS) ×1 IMPLANT
GOWN STRL NON-REIN 2XL LVL4 (GOWNS) ×3
GOWN STRL REUS W/ TWL LRG LVL3 (GOWN DISPOSABLE) ×1 IMPLANT
GOWN STRL REUS W/TWL LRG LVL3 (GOWN DISPOSABLE) ×3
HEAD FEMORAL 28MM SZ S (Head) ×2 IMPLANT
HEMOVAC 400CC 10FR (MISCELLANEOUS) IMPLANT
HOLDER FOLEY CATH W/STRAP (MISCELLANEOUS) ×3 IMPLANT
HOOD PEEL AWAY FLYTE STAYCOOL (MISCELLANEOUS) ×3 IMPLANT
KIT PREVENA INCISION MGT 13 (CANNISTER) ×3 IMPLANT
LINER DM 28MM (Liner) ×3 IMPLANT
LINER DM SZH 28X56 (Liner) IMPLANT
MAT ABSORB  FLUID 56X50 GRAY (MISCELLANEOUS) ×2
MAT ABSORB FLUID 56X50 GRAY (MISCELLANEOUS) ×1 IMPLANT
NDL SAFETY ECLIPSE 18X1.5 (NEEDLE) ×1 IMPLANT
NDL SPNL 20GX3.5 QUINCKE YW (NEEDLE) ×2 IMPLANT
NEEDLE HYPO 18GX1.5 SHARP (NEEDLE) ×3
NEEDLE SPNL 20GX3.5 QUINCKE YW (NEEDLE) ×6 IMPLANT
NS IRRIG 1000ML POUR BTL (IV SOLUTION) ×5 IMPLANT
PACK HIP COMPR (MISCELLANEOUS) ×3 IMPLANT
PENCIL SMOKE EVACUATOR COATED (MISCELLANEOUS) ×1 IMPLANT
QUALITY CONTROL/COU CELL SAVER (MISCELLANEOUS) IMPLANT
SAVER CELL COLL SVCS (MISCELLANEOUS) IMPLANT
SCALPEL PROTECTED #10 DISP (BLADE) ×6 IMPLANT
SEALER BIPOLAR AQUA 6.0 (INSTRUMENTS) ×2 IMPLANT
SHELL ACETABULAR DM  56MM (Shell) ×2 IMPLANT
SOL PREP PVP 2OZ (MISCELLANEOUS) ×3
SOLUTION PREP PVP 2OZ (MISCELLANEOUS) ×1 IMPLANT
SPONGE DRAIN TRACH 4X4 STRL 2S (GAUZE/BANDAGES/DRESSINGS) ×1 IMPLANT
STAPLER SKIN PROX 35W (STAPLE) ×1 IMPLANT
STEM FEMORAL SZ3  STD COLLARED (Stem) ×2 IMPLANT
STRAP SAFETY 5IN WIDE (MISCELLANEOUS) ×3 IMPLANT
SUT DVC 2 QUILL PDO  T11 36X36 (SUTURE) ×2
SUT DVC 2 QUILL PDO T11 36X36 (SUTURE) ×1 IMPLANT
SUT SILK 0 (SUTURE) ×3
SUT SILK 0 30XBRD TIE 6 (SUTURE) ×1 IMPLANT
SUT V-LOC 90 ABS DVC 3-0 CL (SUTURE) ×3 IMPLANT
SUT VIC AB 1 CT1 36 (SUTURE) ×3 IMPLANT
SYR 20ML LL LF (SYRINGE) ×3 IMPLANT
SYR 30ML LL (SYRINGE) ×3 IMPLANT
SYR 50ML LL SCALE MARK (SYRINGE) ×6 IMPLANT
SYR BULB IRRIG 60ML STRL (SYRINGE) ×3 IMPLANT
TAPE MICROFOAM 4IN (TAPE) ×3 IMPLANT
TOWEL OR 17X26 4PK STRL BLUE (TOWEL DISPOSABLE) ×3 IMPLANT
TRAY FOLEY MTR SLVR 16FR STAT (SET/KITS/TRAYS/PACK) ×3 IMPLANT

## 2019-12-28 NOTE — Evaluation (Signed)
Occupational Therapy Evaluation Patient Details Name: Kathleen Everett MRN: [id number] DOB: [birth year] Today's Date: 12/28/2019    History of Present Illness 49yo female pt s/p R THA 12/28/2019, anterior approach, WBAT. PMHx includes anxiety, depression, DDD lumbar, GERD, HLD, chronic tension headaches.   Clinical Impression   Pt seen for OT evaluation this date, POD#0 from above surgery. Pt was independent in all ADL prior to surgery, however using SPC for ADL mobility due to R hip pain. Pt is eager to return to PLOF with less pain and improved safety and independence. Pt reports plan to go to mother's home after hospitalization and will have access to hospital bed, walk in shower with built in seat, and handheld showerhead. Pt currently requires minimal assist for LB dressing and bathing while in seated position due to pain and limited AROM of R hip. Pt instructed in self care skills, falls prevention strategies, pet care considerations, car transfer techniques, home/routines modifications, DME/AE for LB bathing and dressing tasks, and compression stocking mgt strategies. Handout provided to support recall and carryover. Pt would benefit from additional instruction in self care skills and techniques to help maintain precautions with or without assistive devices to support recall and carryover prior to discharge. Do not anticipate need for skilled OT services after discharge. Will benefit from a BSC to improve toilet transfers.     Follow Up Recommendations  No OT follow up    Equipment Recommendations  3 in 1 bedside commode    Recommendations for Other Services       Precautions / Restrictions Precautions Precautions: Anterior Hip;Fall Precaution Booklet Issued: Yes (comment) Restrictions Weight Bearing Restrictions: Yes RLE Weight Bearing: Weight bearing as tolerated      Mobility Bed Mobility               General bed mobility comments: deferred, pt up in recliner at  start and end of session  Transfers Overall transfer level: Needs assistance Equipment used: Rolling walker (2 wheeled) Transfers: Sit to/from Stand Sit to Stand: Min guard;From elevated surface         General transfer comment: VC for technique to improve comfort    Balance Overall balance assessment: Needs assistance Sitting-balance support: No upper extremity supported;Feet supported Sitting balance-Leahy Scale: Fair     Standing balance support: Bilateral upper extremity supported Standing balance-Leahy Scale: Fair                             ADL either performed or assessed with clinical judgement   ADL Overall ADL's : Needs assistance/impaired                                       General ADL Comments: Min A for LB ADL 2/2 pain, anticipate CGA for ADL transfers     Vision Baseline Vision/History: Wears glasses Wears Glasses: At all times(typically wears contacts) Patient Visual Report: No change from baseline       Perception     Praxis      Pertinent Vitals/Pain Pain Assessment: 0-10 Pain Score: 8  Pain Location: R hip Pain Descriptors / Indicators: Aching;Grimacing;Guarding;Sharp Pain Intervention(s): Limited activity within patient's tolerance;Monitored during session;Repositioned;Premedicated before session;Ice applied     Hand Dominance     Extremity/Trunk Assessment Upper Extremity Assessment Upper Extremity Assessment: Overall WFL for tasks assessed   Lower Extremity Assessment Lower  Extremity Assessment: Defer to PT evaluation       Communication Communication Communication: No difficulties   Cognition Arousal/Alertness: Awake/alert Behavior During Therapy: WFL for tasks assessed/performed Overall Cognitive Status: Within Functional Limits for tasks assessed                                     General Comments       Exercises Other Exercises Other Exercises: Pt instructed in falls  prevention, compression stocking mgt, WBAT RLE, AE/DME, car transfer techniques, and home/routines modifications; handout provided to support recall and carryover   Shoulder Instructions      Home Living Family/patient expects to be discharged to:: Private residence Living Arrangements: Spouse/significant other;Children Available Help at Discharge: Family;Available 24 hours/day(pt's mother and 9yo dtr will be able to asssit) Type of Home: House Home Access: Stairs to enter [employer] of Steps: 3-4 Entrance Stairs-Rails: Right Home Layout: One level     Bathroom Shower/Tub: Occupational psychologist: Standard     Home Equipment: Civil engineer, contracting - built in;Hand held shower head;Hospital bed   Additional Comments: Home info above for pt's mother's home where she plans to stay temporarily following discharge - mother's home has hospital bed pt can use      Prior Functioning/Environment Level of Independence: Independent with assistive device(s)        Comments: Pt mod indep with SPC for mobility, indep with ADL but difficult 2/2 R hip pain, driving, no falls        OT Problem List: Decreased strength;Pain;Decreased range of motion;Impaired balance (sitting and/or standing);Decreased knowledge of use of DME or AE      OT Treatment/Interventions: Self-care/ADL training;Therapeutic exercise;Therapeutic activities;DME and/or AE instruction;Patient/family education;Balance training    OT Goals(Current goals can be found in the care plan section) Acute Rehab OT Goals Patient Stated Goal: have less pain and go to mother's home to recover OT Goal Formulation: With patient Time For Goal Achievement: 01/11/20 Potential to Achieve Goals: Good ADL Goals Pt Will Perform Lower Body Dressing: with min guard assist;sit to/from stand;with adaptive equipment Pt Will Transfer to Toilet: with supervision;ambulating(BSC over toilet, LRAD for amb) Additional ADL Goal #1: Pt will  independently instruct family/caregiver in compression stocking mgt  OT Frequency: Min 1X/week   Barriers to D/C:            Co-evaluation              AM-PAC OT "6 Clicks" Daily Activity     Outcome Measure Help from another person eating meals?: None Help from another person taking care of personal grooming?: None Help from another person toileting, which includes using toliet, bedpan, or urinal?: A Little Help from another person bathing (including washing, rinsing, drying)?: A Little Help from another person to put on and taking off regular upper body clothing?: None Help from another person to put on and taking off regular lower body clothing?: A Little 6 Click Score: 21   End of Session    Activity Tolerance: Patient limited by pain Patient left: in chair;with call bell/phone within reach;with chair alarm set;with SCD's reapplied  OT Visit Diagnosis: Other abnormalities of gait and mobility (R26.89);Pain Pain - Right/Left: Right Pain - part of body: Hip                Time: [id number] OT Time Calculation (min): 17 min Charges:  OT General Charges $OT  Visit: 1 Visit OT Evaluation $OT Eval Low Complexity: 1 Low OT Treatments $Self Care/Home Management : 8-22 mins  Jeni Salles, MPH, MS, OTR/L ascom [phone] 12/28/19, 4:35 PM

## 2019-12-28 NOTE — Op Note (Addendum)
12/28/2019  9:37 AM  PATIENT:  Kathleen Everett  49 y.o. female  PRE-OPERATIVE DIAGNOSIS:  PRIMARY OSTEOARTHRITIS OF RIGHT HIP  POST-OPERATIVE DIAGNOSIS:  PRIMARY OSTEOARTHRITIS OF RIGHT HIP  PROCEDURE:  Procedure(s): TOTAL HIP ARTHROPLASTY ANTERIOR APPROACH (Right)  SURGEON: Laurene Footman, MD  ASSISTANTS: None  ANESTHESIA:   spinal  EBL:  Total I/O In: [id number] [I.V.:1000; Blood:125; IV Piggyback:150] Out: 450 [Urine:100; Blood:350]  BLOOD ADMINISTERED:200 CC CELLSAVER  DRAINS: Incisional wound VAC   LOCAL MEDICATIONS USED:  MARCAINE    and OTHER Exparel  SPECIMEN:  Source of Specimen:  Right femoral head  DISPOSITION OF SPECIMEN:  PATHOLOGY  COUNTS:  YES  TOURNIQUET:  * No tourniquets in log *  IMPLANTS: Medacta AMIS 3 standard stem, 56 mm Mpact DM cup and liner with ceramic S 28 mm head  DICTATION: .Dragon Dictation   The patient was brought to the operating room and after spinal anesthesia was obtained patient was placed on the operative table with the ipsilateral foot into the Medacta attachment, contralateral leg on a well-padded table. C-arm was brought in and preop template x-ray taken. After prepping and draping in usual sterile fashion appropriate patient identification and timeout procedures were completed. Anterior approach to the hip was obtained and centered over the greater trochanter and TFL muscle. The subcutaneous tissue was incised hemostasis being achieved by electrocautery. TFL fascia was incised and the muscle retracted laterally deep retractor placed. The lateral femoral circumflex vessels were identified and ligated. The anterior capsule was exposed and a capsulotomy performed. The neck was identified and a femoral neck cut carried out with a saw. The head was removed without difficulty and showed sclerotic femoral head and acetabulum. Reaming was carried out to 56 mm and a 56 mm cup trial gave appropriate tightness to the acetabular component a 56 DM  cup was impacted into position. The leg was then externally rotated and ischiofemoral and pubofemoral releases carried out. The femur was sequentially broached to a size 3, size 3 lateralized with S head trials were placed and the final components chosen. The 3 standard stem was inserted along with a S ceramic 28 mm head and 54 mm liner. The hip was reduced and was stable the wound was thoroughly irrigated with fibrillar placed along the posterior capsule and medial neck. The deep fascia ws closed using a heavy Quill after infiltration of 30 cc of quarter percent Sensorcaine with epinephrine and Exparel throughout the case to aid in postop analgesia, aqua Manis was also used to aid in hemostasis with Cell Saver also used because of patient size.  Marland Kitchen3-0 V-loc to close the skin with skin staples.  Incisional wound VAC applied and patient was sent to recovery in stable condition.   PLAN OF CARE: Admit to inpatient

## 2019-12-28 NOTE — TOC Progression Note (Signed)
Transition of Care [facility]) - Progression Note    Patient Details  Name: Kathleen Everett MRN: [id number] Date of Birth: [birth year]  [facility] CM/SW Contact  Su Hilt, RN Phone Number: 12/28/2019, 3:08 PM  Clinical Narrative:     Patient has a referral with [facility] for [facility], Requested the price of Lovenox will notify the patient once obtained       Expected Discharge Plan and Services                                                 Social Determinants of Health (SDOH) Interventions    Readmission Risk Interventions No flowsheet data found.

## 2019-12-28 NOTE — Telephone Encounter (Signed)
Thank her for the update.  Glad went well.  Tell her to keep [place] posted.

## 2019-12-28 NOTE — Telephone Encounter (Signed)
Patient wanted to let Dr. Nicki Reaper know that her total hip replacement went well today.

## 2019-12-28 NOTE — Anesthesia Preprocedure Evaluation (Addendum)
Anesthesia Evaluation  Patient identified by MRN, date of birth, ID band Patient awake    Reviewed: Allergy & Precautions, H&P , NPO status , Patient's Chart, lab work & pertinent test results  History of Anesthesia Complications (+) PONV  Airway Mallampati: II  TM Distance: >3 FB Neck ROM: full    Dental  (+) Teeth Intact   Pulmonary sleep apnea ,           Cardiovascular negative cardio ROS       Neuro/Psych  Headaches, PSYCHIATRIC DISORDERS Anxiety Depression Chiari malformation s/p surgical decompression    GI/Hepatic Neg liver ROS, GERD  Controlled,  Endo/Other  negative endocrine ROS  Renal/GU      Musculoskeletal   Abdominal   Peds  Hematology negative hematology ROS (+)   Anesthesia Other Findings Past Medical History: No date: Back pain No date: Chiari malformation     Comment:  s/p sgy decompression No date: Chronic tension headaches No date: Complication of anesthesia No date: DDD (degenerative disc disease), lumbar No date: Depression No date: Diverticulitis No date: Dysmenorrhea No date: Endometriosis No date: Gas pain No date: GERD (gastroesophageal reflux disease) No date: Hidradenitis No date: Hyperlipidemia No date: Obesity No date: Osteoarthritis No date: Ovarian cyst     Comment:  hx No date: PONV (postoperative nausea and vomiting) No date: Sleep apnea     Comment:  central sleep apnea  No CPAP right now No date: Vitamin B12 deficiency     Comment:  On injections  Past Surgical History: 03/27/07: ABDOMINAL HYSTERECTOMY     Comment:  laparoscopic 2004: BREAST EXCISIONAL BIOPSY; Left     Comment:  fibroadenoma No date: CESAREAN SECTION     Comment:  1999/2004 twins No date: chiari decompression 04/28/2015: COLONOSCOPY WITH PROPOFOL; N/A     Comment:  Procedure: COLONOSCOPY WITH PROPOFOL;  Surgeon: Manya Silvas, MD;  Location: [place];  Service:                Endoscopy;  Laterality: N/A; No date: CYSTOSCOPY     Comment:  bladder ulcers 04/28/2015: ESOPHAGOGASTRODUODENOSCOPY; N/A     Comment:  Procedure: ESOPHAGOGASTRODUODENOSCOPY (EGD);  Surgeon:               Manya Silvas, MD;  Location: [facility] ENDOSCOPY;                Service: Endoscopy;  Laterality: N/A; 2003: HERNIA REPAIR; Right     Comment:  inguinal 2001: OVARIAN CYST REMOVAL     Comment:  right 2000: TEMPOROMANDIBULAR JOINT SURGERY No date: TUBAL LIGATION     Comment:  Bilateral  BMI    Body Mass Index: 38.47 kg/m      Reproductive/Obstetrics negative OB ROS                            Anesthesia Physical Anesthesia Plan  ASA: II  Anesthesia Plan: Spinal   Post-op Pain Management:    Induction:   PONV Risk Score and Plan: Propofol infusion  Airway Management Planned: Simple Face Mask  Additional Equipment:   Intra-op Plan:   Post-operative Plan:   Informed Consent: I have reviewed the patients History and Physical, chart, labs and discussed the procedure including the risks, benefits and alternatives for the proposed anesthesia with the patient or authorized representative who has indicated his/her understanding and acceptance.  Dental Advisory Given  Plan Discussed with: Anesthesiologist  Anesthesia Plan Comments:        Anesthesia Quick Evaluation

## 2019-12-28 NOTE — H&P (Signed)
Reviewed paper H+P, will be scanned into chart. No changes noted.  

## 2019-12-28 NOTE — Transfer of Care (Signed)
Immediate Anesthesia Transfer of Care Note  Patient: Kathleen Everett  Procedure(s) Performed: TOTAL HIP ARTHROPLASTY ANTERIOR APPROACH (Right Hip)  Patient Location: PACU  Anesthesia Type:Spinal  Level of Consciousness: sedated  Airway & Oxygen Therapy: Patient Spontanous Breathing and Patient connected to face mask oxygen  Post-op Assessment: Report given to RN and Post -op Vital signs reviewed and stable  Post vital signs: Reviewed  Last Vitals:  Vitals Value Taken Time  BP 98/79 12/28/19 0938  Temp 36.6 C 12/28/19 0937  Pulse 78 12/28/19 0938  Resp 15 12/28/19 0938  SpO2 99 % 12/28/19 0938  Vitals shown include unvalidated device data.  Last Pain:  Vitals:   12/28/19 0633  TempSrc: Tympanic  PainSc: 5          Complications: No apparent anesthesia complications

## 2019-12-28 NOTE — Progress Notes (Signed)
15 minute call to floor. 

## 2019-12-28 NOTE — Evaluation (Signed)
Physical Therapy Evaluation Patient Details Name: Kathleen Everett MRN: [id number] DOB: [birth year] Today's Date: 12/28/2019   History of Present Illness  49yo female pt s/p R THA 12/28/2019, anterior approach, WBAT. PMHx includes anxiety, depression, DDD lumbar, GERD, HLD, chronic tension headaches.  Clinical Impression  Pt is a pleasant 49 year old female who was admitted for R THA. Pt performs bed mobility with min assist, transfers with cga, and ambulation with cga and RW. Pt very anxious with mobility, however motivated to perform. Pt demonstrates deficits with strength/mobility/pain. Becomes nauseated with ambulation, distance limited. Would benefit from skilled PT to address above deficits and promote optimal return to PLOF. Recommend transition to [place] upon discharge from acute hospitalization.     Follow Up Recommendations Home health PT    Equipment Recommendations  Rolling walker with 5" wheels;3in1 (PT)    Recommendations for Other Services       Precautions / Restrictions Precautions Precautions: Anterior Hip;Fall Precaution Booklet Issued: No Restrictions Weight Bearing Restrictions: Yes RLE Weight Bearing: Weight bearing as tolerated      Mobility  Bed Mobility Overal bed mobility: Needs Assistance Bed Mobility: Supine to Sit     Supine to sit: Min assist     General bed mobility comments: needs assist for B LE management. Once seated at EOB, able to sit with upright posture. Slight nausea with change of position  Transfers Overall transfer level: Needs assistance Equipment used: Rolling walker (2 wheeled) Transfers: Sit to/from Stand Sit to Stand: Min guard;From elevated surface         General transfer comment: standing performed with RW. Bed slightly elevated. Needs cues for correct hand placement  Ambulation/Gait Ambulation/Gait assistance: Min guard Gait Distance (Feet): 3 Feet Assistive device: Rolling walker (2 wheeled) Gait  Pattern/deviations: Step-to pattern     General Gait Details: ambulated to recliner with RW. Safe technique performed, however pt became extremely nauseated, deferred further ambulation.  Stairs            Wheelchair Mobility    Modified Rankin (Stroke Patients Only)       Balance Overall balance assessment: Needs assistance Sitting-balance support: No upper extremity supported;Feet supported Sitting balance-Leahy Scale: Good     Standing balance support: Bilateral upper extremity supported Standing balance-Leahy Scale: Fair                               Pertinent Vitals/Pain Pain Assessment: 0-10 Pain Score: 8  Pain Location: R hip Pain Descriptors / Indicators: Aching;Grimacing;Guarding;Sharp Pain Intervention(s): Limited activity within patient's tolerance;Premedicated before session;Repositioned;Ice applied    Home Living Family/patient expects to be discharged to:: Private residence Living Arrangements: Spouse/significant other;Children Available Help at Discharge: Family;Available 24 hours/day Type of Home: House Home Access: Stairs to enter Entrance Stairs-Rails: Right Entrance Stairs-Number of Steps: 3-4 Home Layout: One level Home Equipment: Shower seat - built in;Hand held shower head;Hospital bed Additional Comments: Home info above for pt's mother's home where she plans to stay temporarily following discharge - mother's home has hospital bed pt can use    Prior Function Level of Independence: Independent with assistive device(s)         Comments: Pt mod indep with SPC for mobility, indep with ADL but difficult 2/2 R hip pain, driving, no falls     Hand Dominance        Extremity/Trunk Assessment   Upper Extremity Assessment Upper Extremity Assessment: Overall WFL for tasks assessed  Lower Extremity Assessment Lower Extremity Assessment: Generalized weakness(R LE grossly 3-/5; L LE grossly 4/5)       Communication    Communication: No difficulties  Cognition Arousal/Alertness: Awake/alert Behavior During Therapy: WFL for tasks assessed/performed Overall Cognitive Status: Within Functional Limits for tasks assessed                                        General Comments      Exercises Other Exercises Other Exercises: Pt instructed in falls prevention, compression stocking mgt, WBAT RLE, AE/DME, car transfer techniques, and home/routines modifications; handout provided to support recall and carryover   Assessment/Plan    PT Assessment Patient needs continued PT services  PT Problem List Decreased strength;Decreased activity tolerance;Decreased balance;Decreased mobility;Pain;Obesity       PT Treatment Interventions DME instruction;Gait training;Stair training;Therapeutic exercise;Balance training    PT Goals (Current goals can be found in the Care Plan section)  Acute Rehab PT Goals Patient Stated Goal: have less pain and go to mother's home to recover PT Goal Formulation: With patient Time For Goal Achievement: 01/11/20 Potential to Achieve Goals: Good    Frequency BID   Barriers to discharge        Co-evaluation               AM-PAC PT "6 Clicks" Mobility  Outcome Measure Help needed turning from your back to your side while in a flat bed without using bedrails?: A Little Help needed moving from lying on your back to sitting on the side of a flat bed without using bedrails?: A Little Help needed moving to and from a bed to a chair (including a wheelchair)?: A Little Help needed standing up from a chair using your arms (e.g., wheelchair or bedside chair)?: A Little Help needed to walk in hospital room?: A Little Help needed climbing 3-5 steps with a railing? : A Little 6 Click Score: 18    End of Session Equipment Utilized During Treatment: Gait belt Activity Tolerance: Patient limited by pain Patient left: in chair;with chair alarm set;with SCD's  reapplied Nurse Communication: Mobility status PT Visit Diagnosis: Unsteadiness on feet (R26.81);Muscle weakness (generalized) (M62.81);Difficulty in walking, not elsewhere classified (R26.2);Pain Pain - Right/Left: Right Pain - part of body: Hip    Time: 1525-1550 PT Time Calculation (min) (ACUTE ONLY): 25 min   Charges:   PT Evaluation $PT Eval Low Complexity: [place], PT, DPT [phone]   Melenie Minniear 12/28/2019, 5:05 PM

## 2019-12-28 NOTE — Anesthesia Procedure Notes (Addendum)
Spinal  Patient location during procedure: OR Staffing Performed: resident/CRNA  Anesthesiologist: Tera Mater, MD Resident/CRNA: Rolla Plate, CRNA Preanesthetic Checklist Completed: patient identified, IV checked, site marked, risks and benefits discussed, surgical consent, monitors and equipment checked, pre-op evaluation and timeout performed Spinal Block Patient position: sitting Prep: Betadine, ChloraPrep and site prepped and draped Patient monitoring: heart rate, continuous pulse ox, blood pressure and cardiac monitor Approach: midline Location: L4-5 Injection technique: single-shot Needle Needle type: [place]acre and Introducer  Needle gauge: 25 G Needle length: 12.7 cm Additional Notes Negative paresthesia. Negative blood return. Positive free-flowing CSF. Expiration date of kit checked and confirmed. Patient tolerated procedure well, without complications.

## 2019-12-29 LAB — CBC
HCT: 31.5 % — ABNORMAL LOW (ref 36.0–46.0)
Hemoglobin: 10.4 g/dL — ABNORMAL LOW (ref 12.0–15.0)
MCH: 29 pg (ref 26.0–34.0)
MCHC: 33 g/dL (ref 30.0–36.0)
MCV: 87.7 fL (ref 80.0–100.0)
Platelets: 236 10*3/uL (ref 150–400)
RBC: 3.59 MIL/uL — ABNORMAL LOW (ref 3.87–5.11)
RDW: 13.1 % (ref 11.5–15.5)
WBC: 8.2 10*3/uL (ref 4.0–10.5)
nRBC: 0 % (ref 0.0–0.2)

## 2019-12-29 LAB — BASIC METABOLIC PANEL
Anion gap: 8 (ref 5–15)
BUN: 10 mg/dL (ref 6–20)
CO2: 27 mmol/L (ref 22–32)
Calcium: 8.7 mg/dL — ABNORMAL LOW (ref 8.9–10.3)
Chloride: 103 mmol/L (ref 98–111)
Creatinine, Ser: 0.8 mg/dL (ref 0.44–1.00)
GFR calc Af Amer: >60 mL/min (ref >60)
GFR calc non Af Amer: >60 mL/min (ref >60)
Glucose, Bld: 118 mg/dL — ABNORMAL HIGH (ref 70–99)
Potassium: 3.9 mmol/L (ref 3.5–5.1)
Sodium: 138 mmol/L (ref 135–145)

## 2019-12-29 LAB — SURGICAL PATHOLOGY

## 2019-12-29 NOTE — Progress Notes (Signed)
   Subjective: 1 Day Post-Op Procedure(s) (LRB): TOTAL HIP ARTHROPLASTY ANTERIOR APPROACH (Right) Patient reports pain as moderate.   Patient is well, and has had no acute complaints or problems Denies any CP, SOB, ABD pain. We will continue therapy today.  Plan is to go Home after hospital stay.  Objective: Vital signs in last 24 hours: Temp:  [97.4 F (36.3 C)-99.4 F (37.4 C)] 98 F (36.7 C) (02/10 0800) Pulse Rate:  [56-91] 83 (02/10 0800) Resp:  [13-18] 18 (02/10 0144) BP: (95-118)/(52-83) 118/70 (02/10 0800) SpO2:  [97 %-100 %] 98 % (02/10 0800) FiO2 (%):  [21 %] 21 % (02/09 1041)  Intake/Output from previous day: 02/09 0701 - 02/10 0700 In: 2997.8 [P.O.:120; I.V.:2452.8; Blood:125; IV Piggyback:300] Out: 7900 [Urine:7550; Blood:350] Intake/Output this shift: No intake/output data recorded.  Recent Labs    12/28/19 1106 12/29/19 0332  HGB 11.7* 10.4*   Recent Labs    12/28/19 1106 12/29/19 0332  WBC 8.2 8.2  RBC 4.03 3.59*  HCT 35.5* 31.5*  PLT 232 236   Recent Labs    12/28/19 1106 12/29/19 0332  NA  --  138  K  --  3.9  CL  --  103  CO2  --  27  BUN  --  10  CREATININE 0.71 0.80  GLUCOSE  --  118*  CALCIUM  --  8.7*   No results for input(s): LABPT, INR in the last 72 hours.  EXAM General - Patient is Alert, Appropriate and Oriented Extremity - Neurovascular intact Sensation intact distally Intact pulses distally Dorsiflexion/Plantar flexion intact No cellulitis present Compartment soft Dressing - dressing C/D/I and no drainage, prevena with out drainage Motor Function - intact, moving foot and toes well on exam.   Past Medical History:  Diagnosis Date  . Back pain   . Chiari malformation    s/p sgy decompression  . Chronic tension headaches   . Complication of anesthesia   . DDD (degenerative disc disease), lumbar   . Depression   . Diverticulitis   . Dysmenorrhea   . Endometriosis   . Gas pain   . GERD (gastroesophageal  reflux disease)   . Hidradenitis   . Hyperlipidemia   . Obesity   . Osteoarthritis   . Ovarian cyst    hx  . PONV (postoperative nausea and vomiting)   . Sleep apnea    central sleep apnea  No CPAP right now  . Vitamin B12 deficiency    On injections    Assessment/Plan:   1 Day Post-Op Procedure(s) (LRB): TOTAL HIP ARTHROPLASTY ANTERIOR APPROACH (Right) Active Problems:   Status post total hip replacement, right  Estimated body mass index is 38.47 kg/m as calculated from the following:   Height as of this encounter: 5\' 10"  (1.778 m).   Weight as of this encounter: 121.6 kg. Advance diet Up with therapy  Needs bowel movement Labs and vital signs are stable Pain control Care management to assist with discharge to home with home health PT  DVT Prophylaxis - Lovenox, TED hose and SCDs Weight-Bearing as tolerated to Right leg   T. [name], PA-C [place] 12/29/2019, 8:15 AM

## 2019-12-29 NOTE — Telephone Encounter (Signed)
We will plan on restarting, but it is ok for her to wait a few weeks and let us see how she is doing.

## 2019-12-29 NOTE — Telephone Encounter (Signed)
Patient aware of below and stated that she will be in the hospital until tomorrow. Dr Rudene Christians told her that her hip was pretty bad once they got into surgery it was more than he thought. Doing okay, pain controlled. She did question when she should start taking lipitor again. She was on a 10 day supply a few months ago but wasn't sure if she should start it when she comes home or wait until a few weeks post surgery.

## 2019-12-29 NOTE — Progress Notes (Signed)
Physical Therapy Treatment Patient Details Name: Kathleen Everett MRN: [id number] DOB: 01/17/1971 Today's Date: 12/29/2019    History of Present Illness 49yo female pt s/p R THA 12/28/2019, anterior approach, WBAT. PMHx includes anxiety, depression, DDD lumbar, GERD, HLD, chronic tension headaches.    PT Comments    Pt is making good progress towards goals with ability to begin ambulation outside in hallway, however still very anxious with mobility efforts and demonstrates difficulty with advancing R foot during stepping. Good endurance with there-ex, however pain limited. Request to defer sitting in chair until later today. Will continue to progress.   Follow Up Recommendations  Home health PT     Equipment Recommendations  Rolling walker with 5" wheels;3in1 (PT)    Recommendations for Other Services       Precautions / Restrictions Precautions Precautions: Anterior Hip;Fall Precaution Booklet Issued: Yes (comment) Restrictions Weight Bearing Restrictions: Yes RLE Weight Bearing: Weight bearing as tolerated    Mobility  Bed Mobility Overal bed mobility: Needs Assistance Bed Mobility: Supine to Sit     Supine to sit: Min assist     General bed mobility comments: needs assist for R LE management. Once seated, needs assist for positioning of R LE. Upright posture noted  Transfers Overall transfer level: Needs assistance Equipment used: Rolling walker (2 wheeled) Transfers: Sit to/from Stand Sit to Stand: Min guard;From elevated surface         General transfer comment: cues for hand placement. Upright posture performed  Ambulation/Gait Ambulation/Gait assistance: Min assist Gait Distance (Feet): 45 Feet Assistive device: Rolling walker (2 wheeled) Gait Pattern/deviations: Step-to pattern     General Gait Details: needs assist for toe off on R foot and stepping forward. Fatigues quickly and is somewhat self limiting in gait distance. Upright posture with  complaints of pain.   Stairs             Wheelchair Mobility    Modified Rankin (Stroke Patients Only)       Balance Overall balance assessment: Needs assistance Sitting-balance support: No upper extremity supported;Feet supported Sitting balance-Leahy Scale: Good     Standing balance support: Bilateral upper extremity supported Standing balance-Leahy Scale: Fair                              Cognition Arousal/Alertness: Awake/alert Behavior During Therapy: WFL for tasks assessed/performed Overall Cognitive Status: Within Functional Limits for tasks assessed                                        Exercises Other Exercises Other Exercises: supine ther-ex performed on R LE including AP, quad sets, glut squeezes and hip abd/add. All ther-ex performed x 12 reps with min assist. Pain limited    General Comments        Pertinent Vitals/Pain Pain Assessment: 0-10 Pain Score: 6  Pain Location: R hip Pain Descriptors / Indicators: Aching;Grimacing;Guarding;Sharp Pain Intervention(s): Limited activity within patient's tolerance;Repositioned;Ice applied;Premedicated before session    Home Living                      Prior Function            PT Goals (current goals can now be found in the care plan section) Acute Rehab PT Goals Patient Stated Goal: have less pain and go to mother's home  to recover PT Goal Formulation: With patient Time For Goal Achievement: 01/11/20 Potential to Achieve Goals: Good Progress towards PT goals: Progressing toward goals    Frequency    BID      PT Plan Current plan remains appropriate    Co-evaluation              AM-PAC PT "6 Clicks" Mobility   Outcome Measure  Help needed turning from your back to your side while in a flat bed without using bedrails?: A Little Help needed moving from lying on your back to sitting on the side of a flat bed without using bedrails?: A  Little Help needed moving to and from a bed to a chair (including a wheelchair)?: A Little Help needed standing up from a chair using your arms (e.g., wheelchair or bedside chair)?: A Little Help needed to walk in hospital room?: A Little Help needed climbing 3-5 steps with a railing? : A Lot 6 Click Score: 17    End of Session Equipment Utilized During Treatment: Gait belt Activity Tolerance: Patient limited by pain Patient left: in bed;with bed alarm set;with SCD's reapplied Nurse Communication: Mobility status PT Visit Diagnosis: Unsteadiness on feet (R26.81);Muscle weakness (generalized) (M62.81);Difficulty in walking, not elsewhere classified (R26.2);Pain Pain - Right/Left: Right Pain - part of body: Hip     Time: [id number] PT Time Calculation (min) (ACUTE ONLY): 29 min  Charges:  $Gait Training: 8-22 mins $Therapeutic Exercise: 8-22 mins                     Greggory Stallion, PT, DPT [phone]    Kathleen Everett 12/29/2019, 10:07 AM

## 2019-12-29 NOTE — Anesthesia Postprocedure Evaluation (Signed)
Anesthesia Post Note  Patient: Kathleen Everett  Procedure(s) Performed: TOTAL HIP ARTHROPLASTY ANTERIOR APPROACH (Right Hip)  Patient location during evaluation: Nursing Unit Anesthesia Type: Spinal Level of consciousness: awake, oriented, awake and alert and patient cooperative Pain management: pain level controlled Vital Signs Assessment: post-procedure vital signs reviewed and stable Respiratory status: spontaneous breathing, nonlabored ventilation and respiratory function stable Cardiovascular status: stable Postop Assessment: no headache, no backache, patient able to bend at knees, no apparent nausea or vomiting and adequate PO intake Anesthetic complications: no     Last Vitals:  Vitals:   12/28/19 2225 12/29/19 0144  BP: 116/71 114/69  Pulse: 91 87  Resp:  18  Temp: 37.3 C 37.4 C  SpO2: 98% 97%    Last Pain:  Vitals:   12/29/19 0330  TempSrc:   PainSc: Asleep                 Jersie Beel,  [facility]
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
## 2019-12-29 NOTE — Progress Notes (Signed)
Physical Therapy Treatment Patient Details Name: Kathleen Everett MRN: [id number] DOB: 1970-12-18 Today's Date: 12/29/2019    History of Present Illness 49yo female pt s/p R THA 12/28/2019, anterior approach, WBAT. PMHx includes anxiety, depression, DDD lumbar, GERD, HLD, chronic tension headaches.    PT Comments    Pt is making limited progress towards goals and is very limited by pain despite having pain meds prior to therapy. Pt remains motivated to perform therapy, however still struggles with gait progression and fatigue. Pt has moderate anxiety about doing anything to "harm" her surgery. Educated on [place] and movement to improve outcomes. Has difficulty with advancing R foot during swing phase. Good endurance with there-ex, agreeable to sit in recliner post therapy. Will continue to progress.   Follow Up Recommendations  Home health PT     Equipment Recommendations  Rolling walker with 5" wheels;3in1 (PT)    Recommendations for Other Services       Precautions / Restrictions Precautions Precautions: Anterior Hip;Fall Precaution Booklet Issued: Yes (comment) Restrictions Weight Bearing Restrictions: Yes RLE Weight Bearing: Weight bearing as tolerated    Mobility  Bed Mobility Overal bed mobility: Needs Assistance Bed Mobility: Supine to Sit     Supine to sit: Min assist     General bed mobility comments: needs assist for R LE management. Once seated, needs assist for positioning of R LE. Upright posture noted  Transfers Overall transfer level: Needs assistance Equipment used: Rolling walker (2 wheeled) Transfers: Sit to/from Stand Sit to Stand: Min guard;From elevated surface         General transfer comment: cues for hand placement. Upright posture performed  Ambulation/Gait Ambulation/Gait assistance: Min assist Gait Distance (Feet): 60 Feet Assistive device: Rolling walker (2 wheeled) Gait Pattern/deviations: Step-to pattern     General Gait  Details: needs assist for advancing R foot, however pt able to independently move it several times, not consistent. Fatigues quickly . Upright posture and no buckling noted   Stairs             Wheelchair Mobility    Modified Rankin (Stroke Patients Only)       Balance Overall balance assessment: Needs assistance Sitting-balance support: No upper extremity supported;Feet supported Sitting balance-Leahy Scale: Good     Standing balance support: Bilateral upper extremity supported Standing balance-Leahy Scale: Fair                              Cognition Arousal/Alertness: Awake/alert Behavior During Therapy: WFL for tasks assessed/performed Overall Cognitive Status: Within Functional Limits for tasks assessed                                        Exercises Other Exercises Other Exercises: supine ther-ex performed on R LE including AP, quad sets, glut squeezes, SAQ, and hip abd/add. All ther-ex performed x 12 reps with min assist. Pain limited    General Comments        Pertinent Vitals/Pain Pain Assessment: 0-10 Pain Score: 6  Pain Location: R hip Pain Descriptors / Indicators: Aching;Grimacing;Guarding;Sharp Pain Intervention(s): Limited activity within patient's tolerance;Ice applied;Repositioned;Premedicated before session    Home Living                      Prior Function            PT Goals (  current goals can now be found in the care plan section) Acute Rehab PT Goals Patient Stated Goal: have less pain and go to mother's home to recover PT Goal Formulation: With patient Time For Goal Achievement: 01/11/20 Potential to Achieve Goals: Good Progress towards PT goals: Progressing toward goals    Frequency    BID      PT Plan Current plan remains appropriate    Co-evaluation              AM-PAC PT "6 Clicks" Mobility   Outcome Measure  Help needed turning from your back to your side while in a  flat bed without using bedrails?: A Little Help needed moving from lying on your back to sitting on the side of a flat bed without using bedrails?: A Little Help needed moving to and from a bed to a chair (including a wheelchair)?: A Little Help needed standing up from a chair using your arms (e.g., wheelchair or bedside chair)?: A Little Help needed to walk in hospital room?: A Little Help needed climbing 3-5 steps with a railing? : A Lot 6 Click Score: 17    End of Session Equipment Utilized During Treatment: Gait belt Activity Tolerance: Patient limited by pain Patient left: in chair;with chair alarm set;with SCD's reapplied Nurse Communication: Mobility status PT Visit Diagnosis: Unsteadiness on feet (R26.81);Muscle weakness (generalized) (M62.81);Difficulty in walking, not elsewhere classified (R26.2);Pain Pain - Right/Left: Right Pain - part of body: Hip     Time: [id number] PT Time Calculation (min) (ACUTE ONLY): 28 min  Charges:  $Gait Training: 8-22 mins $Therapeutic Exercise: 8-22 mins                     Greggory Stallion, PT, DPT [phone]    Tyshana Nishida 12/29/2019, 2:46 PM

## 2019-12-30 ENCOUNTER — Inpatient Hospital Stay: Payer: Medicare HMO

## 2019-12-30 LAB — URINALYSIS, COMPLETE (UACMP) WITH MICROSCOPIC
Bilirubin Urine: NEGATIVE
Glucose, UA: NEGATIVE mg/dL
Hgb urine dipstick: NEGATIVE
Ketones, ur: NEGATIVE mg/dL
Leukocytes,Ua: NEGATIVE
Nitrite: NEGATIVE
Protein, ur: NEGATIVE mg/dL
Specific Gravity, Urine: 1.006 (ref 1.005–1.030)
pH: 7 (ref 5.0–8.0)

## 2019-12-30 MED ORDER — AZITHROMYCIN 250 MG PO TABS: ORAL_TABLET | ORAL | 0 refills | Status: DC

## 2019-12-30 MED ORDER — AZITHROMYCIN 250 MG PO TABS
250.0000 mg | ORAL_TABLET | Freq: Every day | ORAL | Status: DC
  Administered 2019-12-31 – 2020-01-01 (×2): 250 mg via ORAL
  Filled 2019-12-30 (×2): qty 1

## 2019-12-30 MED ORDER — AZITHROMYCIN 500 MG PO TABS
500.0000 mg | ORAL_TABLET | Freq: Every day | ORAL | Status: AC
  Administered 2019-12-30: 500 mg via ORAL
  Filled 2019-12-30: qty 1

## 2019-12-30 MED ORDER — METHOCARBAMOL 500 MG PO TABS: 500.0000 mg | ORAL_TABLET | Freq: Four times a day (QID) | ORAL | 0 refills | Status: DC | PRN

## 2019-12-30 MED ORDER — IBUPROFEN 400 MG PO TABS
800.0000 mg | ORAL_TABLET | Freq: Once | ORAL | Status: AC
  Administered 2019-12-30: 800 mg via ORAL
  Filled 2019-12-30: qty 2

## 2019-12-30 MED ORDER — DOCUSATE SODIUM 100 MG PO CAPS: 100.0000 mg | ORAL_CAPSULE | Freq: Two times a day (BID) | ORAL | 0 refills | Status: DC

## 2019-12-30 MED ORDER — ENOXAPARIN SODIUM 40 MG/0.4ML ~~LOC~~ SOLN: 40.0000 mg | SUBCUTANEOUS | 0 refills | Status: DC

## 2019-12-30 NOTE — Progress Notes (Signed)
OT Cancellation Note  Patient Details Name: Kathleen Everett MRN: [id number] DOB: 12/22/1970   Cancelled Treatment:    Reason Eval/Treat Not Completed: Other (comment). Pt with nursing staff for pt care. Will re-attempt OT tx at later date/time as pt is available and medically appropriate.   Jeni Salles, MPH, MS, OTR/L ascom [phone] 12/30/19, 12:44 PM

## 2019-12-30 NOTE — Telephone Encounter (Signed)
Unable to leave message. My chart sent.

## 2019-12-30 NOTE — Progress Notes (Signed)
Patient receives 10 mg of oxycodone five times a day at home. Has 5-15 mg q4 PRN. Patient often asks for the PRN dose in between her scheduled oxy. BP was 89/56 and I explained to her while PT was also in room that too much oxy could lower blood pressure and could constipate her and she stated that she understood. Patient still rang out for PRN doses and I did not take PRN doses in d/t blood pressure and her stating that she felt very weak. She became angry while I was on lunch break and told NT that she was upset, in pain, and did not want me back in the room. Stated that she wanted to speak to charge nurse. I informed Bailey Mech the charge nurse and she went in the speak with patient and took over care for this patient.

## 2019-12-30 NOTE — Progress Notes (Signed)
Physical Therapy Treatment Patient Details Name: Kathleen Everett MRN: [id number] DOB: [birth year] Today's Date: 12/30/2019    History of Present Illness 49yo female pt s/p R THA 12/28/2019, anterior approach, WBAT. PMHx includes anxiety, depression, DDD lumbar, GERD, HLD, chronic tension headaches.    PT Comments    Pt was long sitting in bed upon arriving. She is cooperative and pleasant and eager to get OOB. Exited R side of bed with min assist supporting RLE. BP 98/58 while seated EOB. No c/o dizziness/lighthead. She was able to stand from elevated bed height and ambulate around her room. Poor RLE foot clearance with gait however no LOB or unsteadiness. Pt is progressing with gait but limited this morning 2/2 pain/fatigue. She was able to perform exercises in supine position and tolerated well. Pt is seated in recliner with BLEs elevated and call bell in reach. Ice pack applied post session.  PT will return later this date to trial stairs/ increased ambulation per pt tolerance. She is progressing well with PT.    Follow Up Recommendations  Home health PT     Equipment Recommendations  Rolling walker with 5" wheels;3in1 (PT)    Recommendations for Other Services       Precautions / Restrictions Precautions Precautions: Anterior Hip;Fall Precaution Booklet Issued: Yes (comment) Required Braces or Orthoses: Other Brace(wound vac) Restrictions Weight Bearing Restrictions: Yes RLE Weight Bearing: Weight bearing as tolerated    Mobility  Bed Mobility Overal bed mobility: Needs Assistance Bed Mobility: Supine to Sit     Supine to sit: Min assist     General bed mobility comments: Pt required min assist progressing RLE from supine to EOB short sit. She required increased time. No complaints of dizziness even with low BP 98/58.  Transfers Overall transfer level: Needs assistance Equipment used: Rolling walker (2 wheeled) Transfers: Sit to/from Stand Sit to Stand: Min  guard;From elevated surface         General transfer comment: Pt required elevated bed height and Vcs for improved technique and handplacement.   Ambulation/Gait Ambulation/Gait assistance: Supervision Gait Distance (Feet): 30 Feet Assistive device: Rolling walker (2 wheeled) Gait Pattern/deviations: Step-to pattern Gait velocity: decreased   General Gait Details: Pt has difficulty progressing RLE but per pt is improved from previous date. Pt ambulated with slow cadence and step to pattern. Is able to lift RLE with cues to improve lateral wt shift to L while advancing RLE. Supervision for safety. Therapist pushed IV pole and held wound vac.   Stairs             Wheelchair Mobility    Modified Rankin (Stroke Patients Only)       Balance Overall balance assessment: Needs assistance Sitting-balance support: No upper extremity supported;Feet supported Sitting balance-Leahy Scale: Good     Standing balance support: Bilateral upper extremity supported Standing balance-Leahy Scale: Good Standing balance comment: NO LOB noted with UE support. pt was able to static stand without UE support to adjust clothing without LOB also                            Cognition Arousal/Alertness: Awake/alert Behavior During Therapy: WFL for tasks assessed/performed Overall Cognitive Status: Within Functional Limits for tasks assessed                                 General Comments: Pt is A and O  x 4. Very pleasant and cooperative. Requesting OOB to chair.      Exercises Total Joint Exercises Ankle Circles/Pumps: AROM;Both;20 reps;Supine Quad Sets: AROM;Right;10 reps;Supine Gluteal Sets: AROM;Supine;10 reps Short Arc Quad: AROM;Right;10 reps;Supine Heel Slides: AROM;Right;5 reps(through minimal ROM) Straight Leg Raises: AAROM;Right;5 reps;Supine(through minimal ROM)    General Comments        Pertinent Vitals/Pain Pain Assessment: 0-10 Pain Score: 6   Pain Location: R hip Pain Descriptors / Indicators: Aching;Grimacing;Guarding;Sharp Pain Intervention(s): Limited activity within patient's tolerance;Monitored during session;Premedicated before session;Ice applied;Repositioned    Home Living                      Prior Function            PT Goals (current goals can now be found in the care plan section) Acute Rehab PT Goals Patient Stated Goal: " I just want to get better and go home" Progress towards PT goals: Progressing toward goals    Frequency    BID      PT Plan Current plan remains appropriate    Co-evaluation              AM-PAC PT "6 Clicks" Mobility   Outcome Measure  Help needed turning from your back to your side while in a flat bed without using bedrails?: A Little Help needed moving from lying on your back to sitting on the side of a flat bed without using bedrails?: A Little Help needed moving to and from a bed to a chair (including a wheelchair)?: A Little Help needed standing up from a chair using your arms (e.g., wheelchair or bedside chair)?: A Little Help needed to walk in hospital room?: A Little Help needed climbing 3-5 steps with a railing? : A Lot 6 Click Score: 17    End of Session Equipment Utilized During Treatment: Gait belt Activity Tolerance: Patient limited by pain;Patient limited by fatigue Patient left: in chair;with chair alarm set;with SCD's reapplied Nurse Communication: Mobility status PT Visit Diagnosis: Unsteadiness on feet (R26.81);Muscle weakness (generalized) (M62.81);Difficulty in walking, not elsewhere classified (R26.2);Pain Pain - Right/Left: Right Pain - part of body: Hip     Time: [id number] PT Time Calculation (min) (ACUTE ONLY): 30 min  Charges:  $Gait Training: 8-22 mins $Therapeutic Exercise: 8-22 mins                    Julaine Fusi PTA 12/30/19, 9:25 AM

## 2019-12-30 NOTE — Progress Notes (Signed)
   Subjective: 2 Days Post-Op Procedure(s) (LRB): TOTAL HIP ARTHROPLASTY ANTERIOR APPROACH (Right) Patient reports pain as moderate.   Patient reports high fevers last night, chest x-ray and urinalysis have been ordered.  She states she is using incentive spirometer. Denies any CP, SOB, ABD pain. We will continue therapy today.  Plan is to go Home after hospital stay.  Objective: Vital signs in last 24 hours: Temp:  [98 F (36.7 C)-102.8 F (39.3 C)] 101.3 F (38.5 C) (02/11 0625) Pulse Rate:  [83-102] 90 (02/11 0625) Resp:  [18] 18 (02/11 0406) BP: (105-142)/(44-72) 138/68 (02/11 0406) SpO2:  [92 %-100 %] 93 % (02/11 0625)  Intake/Output from previous day: 02/10 0701 - 02/11 0700 In: 1602.1 [P.O.:360; I.V.:1242.1] Out: 3000 [Urine:3000] Intake/Output this shift: No intake/output data recorded.  Recent Labs    12/28/19 1106 12/29/19 0332  HGB 11.7* 10.4*   Recent Labs    12/28/19 1106 12/29/19 0332  WBC 8.2 8.2  RBC 4.03 3.59*  HCT 35.5* 31.5*  PLT 232 236   Recent Labs    12/28/19 1106 12/29/19 0332  NA  --  138  K  --  3.9  CL  --  103  CO2  --  27  BUN  --  10  CREATININE 0.71 0.80  GLUCOSE  --  118*  CALCIUM  --  8.7*   No results for input(s): LABPT, INR in the last 72 hours.  EXAM General - Patient is Alert, Appropriate and Oriented  Pulmonary-lungs clear to auscultation bilaterally with no wheezing rales or rhonchi Extremity - Neurovascular intact Sensation intact distally Intact pulses distally Dorsiflexion/Plantar flexion intact No cellulitis present Compartment soft Dressing - dressing C/D/I and no drainage, prevena with out drainage Motor Function - intact, moving foot and toes well on exam.   Past Medical History:  Diagnosis Date  . Back pain   . Chiari malformation    s/p sgy decompression  . Chronic tension headaches   . Complication of anesthesia   . DDD (degenerative disc disease), lumbar   . Depression   . Diverticulitis    . Dysmenorrhea   . Endometriosis   . Gas pain   . GERD (gastroesophageal reflux disease)   . Hidradenitis   . Hyperlipidemia   . Obesity   . Osteoarthritis   . Ovarian cyst    hx  . PONV (postoperative nausea and vomiting)   . Sleep apnea    central sleep apnea  No CPAP right now  . Vitamin B12 deficiency    On injections    Assessment/Plan:   2 Days Post-Op Procedure(s) (LRB): TOTAL HIP ARTHROPLASTY ANTERIOR APPROACH (Right) Active Problems:   Status post total hip replacement, right   Postop fever Estimated body mass index is 38.47 kg/m as calculated from the following:   Height as of this encounter: 5\' 10"  (1.778 m).   Weight as of this encounter: 121.6 kg. Advance diet Up with therapy  Needs bowel movement Postop fever -urinalysis negative.  Chest x-ray shows atelectasis versus mild early infiltrative process.  Due to fevers will go ahead and start course of antibiotics.  Continue to monitor temp Pain still moderate.  Will have nursing utilize methocarbamol to see if this will help with additional pain relief. Care management to assist with discharge to home with home health PT  DVT Prophylaxis - Lovenox, TED hose and SCDs Weight-Bearing as tolerated to Right leg   T. [name], PA-C [place] 12/30/2019, 7:27 AM
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
## 2019-12-30 NOTE — Progress Notes (Signed)
Physical Therapy Treatment Patient Details Name: Kathleen Everett MRN: [id number] DOB: 01/01/1971 Today's Date: 12/30/2019    History of Present Illness 49yo female pt s/p R THA 12/28/2019, anterior approach, WBAT. PMHx includes anxiety, depression, DDD lumbar, GERD, HLD, chronic tension headaches.    PT Comments    Pt was long sitting in bed upon arriving. She agrees to PT session but reports increased pain this afternoon. Reviewed hip precautions and wt bearing. Per RN, pt unable to receive pain meds currently but will bring pt meds when she is able. Pt c/o 6/10 pain at rest that elevated to 9/10 pain with ambulation. Pt required min A to exit R side of bed with increased time required 2/2 pain. She stood( CGA) and ambulated 100 ft with RW. Pt is unable to clear RLE from floor during gait training. She slides R foot on floor to advance. Constant vcs for improved technique, however pt still unable to clear foot. Distance limited due to pain. Unable/unwilling to trial stairs this date. PT will trial stairs next session if pt able. She is progressing but slowly 2/2 to pain. Pt is repositioned in bed, call bell in reach, ice/SCDs reapplied post PT session.     Follow Up Recommendations  Home health PT     Equipment Recommendations  Rolling walker with 5" wheels;3in1 (PT);Other (comment)(pt requesting bariatric 3 in 1)    Recommendations for Other Services       Precautions / Restrictions Precautions Precautions: Anterior Hip;Fall Precaution Booklet Issued: Yes (comment) Restrictions Weight Bearing Restrictions: Yes RLE Weight Bearing: Weight bearing as tolerated    Mobility  Bed Mobility Overal bed mobility: Needs Assistance Bed Mobility: Supine to Sit     Supine to sit: Min assist;HOB elevated Sit to supine: Min assist;HOB elevated   General bed mobility comments: Min A supporting RLE in/out of bed. PT with pain increase with all mobility this afternoon  Transfers Overall  transfer level: Needs assistance Equipment used: Rolling walker (2 wheeled) Transfers: Sit to/from Stand Sit to Stand: Min guard;From elevated surface         General transfer comment: Pt required CGA for safety with transfers.  Ambulation/Gait Ambulation/Gait assistance: Min guard Gait Distance (Feet): 80 Feet Assistive device: Rolling walker (2 wheeled) Gait Pattern/deviations: Step-to pattern;Trunk flexed Gait velocity: decreased   General Gait Details: Pt has heavy UE lean on RW and is unable to clear RLE during swing phase of gait. She required vcs throughout for improved technique with poor carry-over. Pt is limited by pain and unable to trial stairs this afternoon. Therapist will trial with pt next date/session.   Stairs             Wheelchair Mobility    Modified Rankin (Stroke Patients Only)       Balance                                            Cognition Arousal/Alertness: Awake/alert Behavior During Therapy: WFL for tasks assessed/performed Overall Cognitive Status: Within Functional Limits for tasks assessed                                 General Comments: Pt agrees to PT session. She is in more pain this afternoon versus previous session this morning.       Exercises  General Comments        Pertinent Vitals/Pain Pain Assessment: 0-10 Pain Score: 6  Pain Location: R hip Pain Descriptors / Indicators: Aching;Grimacing;Guarding;Sharp Pain Intervention(s): Limited activity within patient's tolerance;Monitored during session;Patient requesting pain meds-RN notified;Ice applied;Repositioned    Home Living                      Prior Function            PT Goals (current goals can now be found in the care plan section) Acute Rehab PT Goals Patient Stated Goal: " I just want to get better and go home" Progress towards PT goals: Progressing toward goals    Frequency    BID      PT  Plan Current plan remains appropriate    Co-evaluation              AM-PAC PT "6 Clicks" Mobility   Outcome Measure  Help needed turning from your back to your side while in a flat bed without using bedrails?: A Little Help needed moving from lying on your back to sitting on the side of a flat bed without using bedrails?: A Little Help needed moving to and from a bed to a chair (including a wheelchair)?: A Little Help needed standing up from a chair using your arms (e.g., wheelchair or bedside chair)?: A Little Help needed to walk in hospital room?: A Little Help needed climbing 3-5 steps with a railing? : A Lot 6 Click Score: 17    End of Session Equipment Utilized During Treatment: Gait belt Activity Tolerance: Patient limited by pain Patient left: in bed;with bed alarm set;in CPM Nurse Communication: Mobility status PT Visit Diagnosis: Unsteadiness on feet (R26.81);Muscle weakness (generalized) (M62.81);Difficulty in walking, not elsewhere classified (R26.2);Pain Pain - Right/Left: Right Pain - part of body: Hip     Time: [id number] PT Time Calculation (min) (ACUTE ONLY): 21 min  Charges:  $Gait Training: 8-22 mins                     Julaine Fusi PTA 12/30/19, 1:57 PM

## 2019-12-30 NOTE — Discharge Instructions (Signed)
ANTERIOR APPROACH TOTAL HIP REPLACEMENT POSTOPERATIVE DIRECTIONS   Hip Rehabilitation, Guidelines Following Surgery  The results of a hip operation are greatly improved after range of motion and muscle strengthening exercises. Follow all safety measures which are given to protect your hip. If any of these exercises cause increased pain or swelling in your joint, decrease the amount until you are comfortable again. Then slowly increase the exercises. Call your caregiver if you have problems or questions.   HOME CARE INSTRUCTIONS  Remove items at home which could result in a fall. This includes throw rugs or furniture in walking pathways.   ICE to the affected hip every three hours for 30 minutes at a time and then as needed for pain and swelling.  Continue to use ice on the hip for pain and swelling from surgery. You may notice swelling that will progress down to the foot and ankle.  This is normal after surgery.  Elevate the leg when you are not up walking on it.    Continue to use the breathing machine which will help keep your temperature down.  It is common for your temperature to cycle up and down following surgery, especially at night when you are not up moving around and exerting yourself.  The breathing machine keeps your lungs expanded and your temperature down.  Do not place pillow under knee, focus on keeping the knee straight while resting  DIET You may resume your previous home diet once your are discharged from the hospital.  DRESSING / WOUND CARE / SHOWERING Please remove provena negative pressure dressing on 01/06/2019 and apply honey comb dressing. Keep dressing clean and dry at all times.  ACTIVITY Walk with your walker as instructed. Use walker as long as suggested by your caregivers. Avoid periods of inactivity such as sitting longer than an hour when not asleep. This helps prevent blood clots.  You may resume a sexual relationship in one month or when given the OK by  your doctor.  You may return to work once you are cleared by your doctor.  Do not drive a car for 6 weeks or until released by you surgeon.  Do not drive while taking narcotics.  WEIGHT BEARING Weight bearing as tolerated. Use walker/cane as needed for at least 4 weeks post op.  POSTOPERATIVE CONSTIPATION PROTOCOL Constipation - defined medically as fewer than three stools per week and severe constipation as less than one stool per week.  One of the most common issues patients have following surgery is constipation.  Even if you have a regular bowel pattern at home, your normal regimen is likely to be disrupted due to multiple reasons following surgery.  Combination of anesthesia, postoperative narcotics, change in appetite and fluid intake all can affect your bowels.  In order to avoid complications following surgery, here are some recommendations in order to help you during your recovery period.  Colace (docusate) - Pick up an over-the-counter form of Colace or another stool softener and take twice a day as long as you are requiring postoperative pain medications.  Take with a full glass of water daily.  If you experience loose stools or diarrhea, hold the colace until you stool forms back up.  If your symptoms do not get better within 1 week or if they get worse, check with your doctor.  Dulcolax (bisacodyl) - Pick up over-the-counter and take as directed by the product packaging as needed to assist with the movement of your bowels.  Take with a full  glass of water.  Use this product as needed if not relieved by Colace only.  ° °MiraLax (polyethylene glycol) - Pick up over-the-counter to have on hand.  MiraLax is a solution that will increase the amount of water in your bowels to assist with bowel movements.  Take as directed and can mix with a glass of water, juice, soda, coffee, or tea.  Take if you go more than two days without a movement. °Do not use MiraLax more than once per day. Call your  doctor if you are still constipated or irregular after using this medication for 7 days in a row. ° °If you continue to have problems with postoperative constipation, please contact the office for further assistance and recommendations.  If you experience "the worst abdominal pain ever" or develop nausea or vomiting, please contact the office immediatly for further recommendations for treatment. ° °ITCHING ° If you experience itching with your medications, try taking only a single pain pill, or even half a pain pill at a time.  You can also use Benadryl over the counter for itching or also to help with sleep.  ° °TED HOSE STOCKINGS °Wear the elastic stockings on both legs for six weeks following surgery during the day but you may remove then at night for sleeping. ° °MEDICATIONS °See your medication summary on the “After Visit Summary” that the nursing staff will review with you prior to discharge.  You may have some home medications which will be placed on hold until you complete the course of blood thinner medication.  It is important for you to complete the blood thinner medication as prescribed by your surgeon.  Continue your approved medications as instructed at time of discharge. ° °PRECAUTIONS °If you experience chest pain or shortness of breath - call 911 immediately for transfer to the hospital emergency department.  °If you develop a fever greater that 101 F, purulent drainage from wound, increased redness or drainage from wound, foul odor from the wound/dressing, or calf pain - CONTACT YOUR SURGEON.   °                                                °FOLLOW-UP APPOINTMENTS °Make sure you keep all of your appointments after your operation with your surgeon and caregivers. You should call the office at the above phone number and make an appointment for approximately two weeks after the date of your surgery or on the date instructed by your surgeon outlined in the "After Visit Summary". ° °RANGE OF MOTION  AND STRENGTHENING EXERCISES  °These exercises are designed to help you keep full movement of your hip joint. Follow your caregiver's or physical therapist's instructions. Perform all exercises about fifteen times, three times per day or as directed. Exercise both hips, even if you have had only one joint replacement. These exercises can be done on a training (exercise) mat, on the floor, on a table or on a bed. Use whatever works the best and is most comfortable for you. Use music or television while you are exercising so that the exercises are a pleasant break in your day. This will make your life better with the exercises acting as a break in routine you can look forward to.  °Lying on your back, slowly slide your foot toward your buttocks, raising your knee up off the floor. Then slowly   slide your foot back down until your leg is straight again.  Lying on your back spread your legs as far apart as you can without causing discomfort.  Lying on your side, raise your upper leg and foot straight up from the floor as far as is comfortable. Slowly lower the leg and repeat.  Lying on your back, tighten up the muscle in the front of your thigh (quadriceps muscles). You can do this by keeping your leg straight and trying to raise your heel off the floor. This helps strengthen the largest muscle supporting your knee.  Lying on your back, tighten up the muscles of your buttocks both with the legs straight and with the knee bent at a comfortable angle while keeping your heel on the floor.   IF YOU ARE TRANSFERRED TO A SKILLED REHAB FACILITY If the patient is transferred to a skilled rehab facility following release from the hospital, a list of the current medications will be sent to the facility for the patient to continue.  When discharged from the skilled rehab facility, please have the facility set up the patient's [place] prior to being released. Also, the skilled facility will be responsible  for providing the patient with their medications at time of release from the facility to include their pain medication, the muscle relaxants, and their blood thinner medication. If the patient is still at the rehab facility at time of the two week follow up appointment, the skilled rehab facility will also need to assist the patient in arranging follow up appointment in our office and any transportation needs.  MAKE SURE YOU:  Understand these instructions.  Get help right away if you are not doing well or get worse.    Pick up stool softner and laxative for home use following surgery while on pain medications. Continue to use ice for pain and swelling after surgery. Do not use any lotions or creams on the incision until instructed by your surgeon.

## 2019-12-30 NOTE — Progress Notes (Signed)
OT Cancellation Note  Patient Details Name: Charra Tenbarge MRN: [id number] DOB: 1971-05-14   Cancelled Treatment:    Reason Eval/Treat Not Completed: Other (comment). On 2nd attempt, pt with nurse and nurse tech, back to bed from bathroom. RN reports giving pt medication for pain and a fever. Will hold OT tx and re-attempt next date as appropriate.   Jeni Salles, MPH, MS, OTR/L ascom [phone] 12/30/19, 4:40 PM

## 2019-12-31 ENCOUNTER — Inpatient Hospital Stay: Payer: Medicare HMO

## 2019-12-31 LAB — CBC
HCT: 28.4 % — ABNORMAL LOW (ref 36.0–46.0)
Hemoglobin: 9.1 g/dL — ABNORMAL LOW (ref 12.0–15.0)
MCH: 28.7 pg (ref 26.0–34.0)
MCHC: 32 g/dL (ref 30.0–36.0)
MCV: 89.6 fL (ref 80.0–100.0)
Platelets: 191 10*3/uL (ref 150–400)
RBC: 3.17 MIL/uL — ABNORMAL LOW (ref 3.87–5.11)
RDW: 13.4 % (ref 11.5–15.5)
WBC: 6.9 10*3/uL (ref 4.0–10.5)
nRBC: 0 % (ref 0.0–0.2)

## 2019-12-31 MED ORDER — OXYCODONE HCL 5 MG PO TABS: 10.0000 mg | ORAL_TABLET | Freq: Four times a day (QID) | ORAL | 0 refills | Status: DC | PRN

## 2019-12-31 NOTE — Progress Notes (Signed)
Pt ahd a temp of 101.3 at 2236. Tylenol administered, which was effective, temp reduced to 100. Will continue to monitor

## 2019-12-31 NOTE — Progress Notes (Signed)
Subjective: 3 Days Post-Op Procedure(s) (LRB): TOTAL HIP ARTHROPLASTY ANTERIOR APPROACH (Right) Patient reports pain as mild.   Noted to have low fever of 100.5.  Continue to use incentive spirometer.  No complaints of coughing chest pain shortness of breath or urinary symptoms.  No abdominal pain. Denies any CP, SOB, ABD pain. We will continue therapy today.  Plan is to go Home after hospital stay.  Objective: Vital signs in last 24 hours: Temp:  [99 F (37.2 C)-101.3 F (38.5 C)] 100.5 F (38.1 C) (02/12 0750) Pulse Rate:  [72-92] 75 (02/12 0750) Resp:  [16-18] 18 (02/12 0750) BP: (97-128)/(56-66) 97/62 (02/12 0750) SpO2:  [95 %-100 %] 95 % (02/12 0750)  Intake/Output from previous day: 02/11 0701 - 02/12 0700 In: 2742.6 [P.O.:600; I.V.:2142.6] Out: 400 [Urine:400] Intake/Output this shift: No intake/output data recorded.  Recent Labs    12/28/19 1106 12/29/19 0332 12/31/19 0620  HGB 11.7* 10.4* 9.1*   Recent Labs    12/29/19 0332 12/31/19 0620  WBC 8.2 6.9  RBC 3.59* 3.17*  HCT 31.5* 28.4*  PLT 236 191   Recent Labs    12/28/19 1106 12/29/19 0332  NA  --  138  K  --  3.9  CL  --  103  CO2  --  27  BUN  --  10  CREATININE 0.71 0.80  GLUCOSE  --  118*  CALCIUM  --  8.7*   No results for input(s): LABPT, INR in the last 72 hours.  EXAM General - Patient is Alert, Appropriate and Oriented  Pulmonary-lungs clear to auscultation bilaterally with no wheezing rales or rhonchi Abdomen: Soft nontender nondistended Extremity - Neurovascular intact Sensation intact distally Intact pulses distally Dorsiflexion/Plantar flexion intact No cellulitis present Compartment soft Dressing - dressing C/D/I and no drainage, prevena with out drainage Motor Function - intact, moving foot and toes well on exam.   Past Medical History:  Diagnosis Date  . Back pain   . Chiari malformation    s/p sgy decompression  . Chronic tension headaches   . Complication of  anesthesia   . DDD (degenerative disc disease), lumbar   . Depression   . Diverticulitis   . Dysmenorrhea   . Endometriosis   . Gas pain   . GERD (gastroesophageal reflux disease)   . Hidradenitis   . Hyperlipidemia   . Obesity   . Osteoarthritis   . Ovarian cyst    hx  . PONV (postoperative nausea and vomiting)   . Sleep apnea    central sleep apnea  No CPAP right now  . Vitamin B12 deficiency    On injections    Assessment/Plan:   3 Days Post-Op Procedure(s) (LRB): TOTAL HIP ARTHROPLASTY ANTERIOR APPROACH (Right) Active Problems:   Status post total hip replacement, right   Postop fever Estimated body mass index is 38.47 kg/m as calculated from the following:   Height as of this encounter: 5\' 10"  (1.778 m).   Weight as of this encounter: 121.6 kg. Advance diet Up with therapy  Postop fever -urinalysis negative.  Chest x-ray shows atelectasis versus mild early infiltrative process.  Due to fevers will go ahead and start course of antibiotics.  Temp trending down and WBCs trending down. Pain improving.  Will continue utilizing methocarbamol with oxycodone. Care management to assist with discharge to home with home health PT today pending good progress with PT  DVT Prophylaxis - Lovenox, TED hose and SCDs Weight-Bearing as tolerated to Right leg  Ronney Asters, PA-C [place] 12/31/2019, 8:13 AM

## 2019-12-31 NOTE — Progress Notes (Addendum)
Physical Therapy Treatment Patient Details Name: Kathleen Everett MRN: [id number] DOB: [birth year] Today's Date: 12/31/2019    History of Present Illness 49yo female pt s/p R THA 12/28/2019, anterior approach, WBAT. PMHx includes anxiety, depression, DDD lumbar, GERD, HLD, chronic tension headaches.    PT Comments    Pt was seated in recliner upon arriving. She agrees to PT session. Temp: 100.0 BP: 104/54 HR: 86 O2: 99%. Pt reports having 6/10 pain that elevated to 7/10 with ambulation and wt bearing activity. Pt was able to stand from recliner with CGA only for safety. No lifting assistance required. She ambulated 120 ft with RW with very slow step to pattern. Pt continues to have inability to clear RLE off floor during swing phase but did not require assistance. CGA throughout gait training for safety. Pt requested holding off on stairs till later this date. She was repositioned in bed post session with ice applied, SCDs donned, call bell in reach and bed alarm set. She tolerated session well. BP after returning from gait training 112/82. MD entered room during session and plans to d/c this afternoon. PT continues to recommend pt d/c to home with HHPT.    Follow Up Recommendations  Home health PT     Equipment Recommendations  Rolling walker with 5" wheels;3in1 (PT);Other (comment)    Recommendations for Other Services       Precautions / Restrictions Precautions Precautions: Anterior Hip;Fall Precaution Booklet Issued: Yes (comment) Required Braces or Orthoses: Other Brace Other Brace: wound vac Restrictions Weight Bearing Restrictions: Yes RLE Weight Bearing: Weight bearing as tolerated    Mobility  Bed Mobility Overal bed mobility: Needs Assistance Bed Mobility: Sit to Supine     Supine to sit: Supervision Sit to supine: Min assist;HOB elevated   General bed mobility comments: Pt continues to require assistance advancing RLE into bed 2/2 pain/strength  deficits  Transfers Overall transfer level: Modified independent Equipment used: Rolling walker (2 wheeled) Transfers: Sit to/from Stand Sit to Stand: Min guard         General transfer comment: Pt was able to stand without physical lifting assistance. Pt required cueing only for improved technique. CGA for safety  Ambulation/Gait Ambulation/Gait assistance: Min guard;Supervision Gait Distance (Feet): 120 Feet Assistive device: Rolling walker (2 wheeled) Gait Pattern/deviations: Step-to pattern;Trunk flexed Gait velocity: decreased   General Gait Details: Pt has heavy UE lean on RW and is unable to clear RLE during swing phase of gait. She required vcs throughout for improved technique with poor carry-over. Pt is limited by pain and unable to trial stairs this afternoon. Therapist will trial with pt next date/session.   Stairs Stairs: (will trial this afternoon)           Wheelchair Mobility    Modified Rankin (Stroke Patients Only)       Balance Overall balance assessment: Needs assistance Sitting-balance support: No upper extremity supported;Feet supported Sitting balance-Leahy Scale: Good     Standing balance support: Bilateral upper extremity supported Standing balance-Leahy Scale: Good                              Cognition Arousal/Alertness: Awake/alert Behavior During Therapy: WFL for tasks assessed/performed Overall Cognitive Status: Within Functional Limits for tasks assessed                                 General Comments: Pt  is A and O x 4. cooperative and pleasant. requested holding off on stairs to this afternoon      Exercises Other Exercises Other Exercises: Pt denied recall of previous instruction; further instructed in falls prevention, compression stocking mgt, pet care considerations, WBAT RLE, AE/DME, car transfer techniques, and home/routines modifications; handout provided to support recall and carryover     General Comments General comments (skin integrity, edema, etc.): wound vac in place at start and end of session      Pertinent Vitals/Pain Pain Assessment: 0-10 Pain Score: 6  Pain Location: R hip Pain Descriptors / Indicators: Aching;Grimacing;Guarding;Sharp Pain Intervention(s): Limited activity within patient's tolerance;Monitored during session;Premedicated before session;Repositioned;Ice applied    Home Living                      Prior Function            PT Goals (current goals can now be found in the care plan section) Acute Rehab PT Goals Patient Stated Goal: " I just want to get better and go home" Progress towards PT goals: Progressing toward goals    Frequency    BID      PT Plan Current plan remains appropriate    Co-evaluation              AM-PAC PT "6 Clicks" Mobility   Outcome Measure  Help needed turning from your back to your side while in a flat bed without using bedrails?: A Little Help needed moving from lying on your back to sitting on the side of a flat bed without using bedrails?: A Little Help needed moving to and from a bed to a chair (including a wheelchair)?: A Little Help needed standing up from a chair using your arms (e.g., wheelchair or bedside chair)?: A Little Help needed to walk in hospital room?: A Little Help needed climbing 3-5 steps with a railing? : A Little 6 Click Score: 18    End of Session Equipment Utilized During Treatment: Gait belt Activity Tolerance: Patient tolerated treatment well;Patient limited by pain Patient left: in bed;with bed alarm set;with call bell/phone within reach Nurse Communication: Mobility status PT Visit Diagnosis: Unsteadiness on feet (R26.81);Muscle weakness (generalized) (M62.81);Difficulty in walking, not elsewhere classified (R26.2);Pain Pain - Right/Left: Right Pain - part of body: Hip     Time: 1100-1138 PT Time Calculation (min) (ACUTE ONLY): 38 min  Charges:  $Gait  Training: 23-37 mins $Therapeutic Activity: 8-22 mins                     Julaine Fusi PTA 12/31/19, 12:33 PM

## 2019-12-31 NOTE — Care Management Important Message (Signed)
Important Message  Patient Details  Name: Kathleen Everett MRN: [id number] Date of Birth: [birth year]   Medicare Important Message Given:  Yes     [name] A Mieczyslaw Stamas 12/31/2019, 1:19 PM

## 2019-12-31 NOTE — Evaluation (Signed)
Occupational Therapy Evaluation Patient Details Name: Jionni Kolm MRN: [id number] DOB: Apr 14, 1971 Today's Date: 12/31/2019    History of Present Illness 49yo female pt s/p R THA 12/28/2019, anterior approach, WBAT. PMHx includes anxiety, depression, DDD lumbar, GERD, HLD, chronic tension headaches.   Clinical Impression   Pt seen for OT tx this date. PT reporting 6/10 R hip pain at start of session, up to 8/10 by end of session. Pt performed supine>sit EOB with supervision, additional time/effort to slowly shift RLE towards L side of the bed. CGA to stand with RW and from elevated surface. Pt ambulated with RW to bathroom, cues for sequencing to negotiate floor transition. Pt instructed in toilet transfers with and without BSC over regular height toilet. Pt preferring 2nd attempt to regular height toilet 2/2 BSC being narrow/uncomfortable per pt report. Heavy reliance on L side grab bar however to help lower herself down. Pt will not have access to a L side rail at home and pt encouraged to try without use of grab bar. Supervision for lateral lean pericare. Pt instructed in AE/DME, falls prevention, compression stocking mgt, pet care considerations, and home/routines modifications for bathing and dressing to maximize safety/indep with ADL and recall from previous session. Pt verbalized understanding and appreciative of additional instruction, as she reported she barely recalled education provided on POD0. Pt continues to benefit from skilled OT services while hospitalized.     Follow Up Recommendations  No OT follow up    Equipment Recommendations  3 in 1 bedside commode    Recommendations for Other Services       Precautions / Restrictions Precautions Precautions: Anterior Hip;Fall Precaution Booklet Issued: Yes (comment) Required Braces or Orthoses: Other Brace Other Brace: wound vac Restrictions Weight Bearing Restrictions: Yes RLE Weight Bearing: Weight bearing as tolerated       Mobility Bed Mobility Overal bed mobility: Needs Assistance Bed Mobility: Supine to Sit     Supine to sit: Supervision     General bed mobility comments: increased time/effort to bring RLE over to L side of bed, but able to perform without physical assist  Transfers Overall transfer level: Needs assistance Equipment used: Rolling walker (2 wheeled)   Sit to Stand: Min guard;From elevated surface              Balance Overall balance assessment: Needs assistance Sitting-balance support: No upper extremity supported;Feet supported Sitting balance-Leahy Scale: Good     Standing balance support: Bilateral upper extremity supported Standing balance-Leahy Scale: Good                             ADL either performed or assessed with clinical judgement   ADL Overall ADL's : Needs assistance/impaired                     Lower Body Dressing: Sit to/from stand;Minimal assistance Lower Body Dressing Details (indicate cue type and reason): pt instructed AE to improve indep and comfort with LB dressing Toilet Transfer: RW;Ambulation;Comfort height toilet;Regular Toilet;Min guard;Grab bars Toilet Transfer Details (indicate cue type and reason): cues for sequencing; first tried [facility] over toilet but it was too uncomfortable; then tried regular height toilet with heavy reliant on L grab rail beside toilet to lower herself down Toileting- Clothing Manipulation and Hygiene: Sitting/lateral lean;Modified independent               Vision Baseline Vision/History: Wears glasses Wears Glasses: At all times Patient  Visual Report: No change from baseline       Perception     Praxis      Pertinent Vitals/Pain Pain Assessment: 0-10 Pain Score: 7  Pain Location: R hip Pain Descriptors / Indicators: Aching;Grimacing;Guarding;Sharp Pain Intervention(s): Limited activity within patient's tolerance;Monitored during session;Repositioned;Ice applied     Hand  Dominance     Extremity/Trunk Assessment             Communication     Cognition Arousal/Alertness: Awake/alert Behavior During Therapy: WFL for tasks assessed/performed Overall Cognitive Status: Within Functional Limits for tasks assessed                                     General Comments  wound vac in place at start and end of session    Exercises Other Exercises Other Exercises: Pt denied recall of previous instruction; further instructed in falls prevention, compression stocking mgt, pet care considerations, WBAT RLE, AE/DME, car transfer techniques, and home/routines modifications; handout provided to support recall and carryover   Shoulder Instructions      Home Living                                          Prior Functioning/Environment                   OT Problem List:        OT Treatment/Interventions:      OT Goals(Current goals can be found in the care plan section) Acute Rehab OT Goals Patient Stated Goal: " I just want to get better and go home" OT Goal Formulation: With patient Time For Goal Achievement: 01/11/20 Potential to Achieve Goals: Good  OT Frequency: Min 1X/week   Barriers to D/C:            Co-evaluation              AM-PAC OT "6 Clicks" Daily Activity     Outcome Measure Help from another person eating meals?: None Help from another person taking care of personal grooming?: None Help from another person toileting, which includes using toliet, bedpan, or urinal?: A Little Help from another person bathing (including washing, rinsing, drying)?: A Little Help from another person to put on and taking off regular upper body clothing?: None Help from another person to put on and taking off regular lower body clothing?: A Little 6 Click Score: 21   End of Session Equipment Utilized During Treatment: Gait belt;Rolling walker  Activity Tolerance: Patient tolerated treatment well Patient  left: in chair;with call bell/phone within reach;with chair alarm set;with SCD's reapplied;Other (comment)(wound vac in place, ice pack on R hip)  OT Visit Diagnosis: Other abnormalities of gait and mobility (R26.89);Pain Pain - Right/Left: Right Pain - part of body: Hip                Time: [id number] OT Time Calculation (min): 48 min Charges:  OT General Charges $OT Visit: 1 Visit OT Treatments $Self Care/Home Management : 38-52 mins  Jeni Salles, MPH, MS, OTR/L ascom [phone] 12/31/19, 9:42 AM

## 2019-12-31 NOTE — Progress Notes (Signed)
Ch visited with Pt as part of routine rounding. Pt expressed that she was hurting at this time. Pt was emotional, and concerned about if she made the right decision with the procedure, because of the pain she is going through. Pt was also concerned about going home, that she would not be a burden to the ones who will be taking care of her during recovery. Pt requested prayer, and was tearful during prayer. Pt was grateful for Ch's visit, and was glad to hear that chaplains are available 24/7.    12/31/19 1036  Clinical Encounter Type  Visited With Patient  Visit Type Initial;Psychological support;Spiritual support  Referral From Other (Comment) (Routine Rounding)  Consult/Referral To None  Spiritual Encounters  Spiritual Needs Prayer;Emotional  Stress Factors  Patient Stress Factors Health changes;Loss of control;Major life changes

## 2019-12-31 NOTE — TOC Benefit Eligibility Note (Signed)
Transition of Care [facility]) Benefit Eligibility Note    Patient Details  Name: Niala Cabbagestalk MRN: [id number] Date of Birth: Feb 25, 1971   Medication/Dose: ENOXAPARIN  40 MG DAILY  X 14 SYRINGES  Covered?: Yes  Tier: (TIER- 4)  Prescription Coverage Preferred Pharmacy: [facility] [place] with Person/Company/Phone Number:: MELODY   @ [employer] # [phone]  Co-Pay: OVER THE  Q/L  OF 14 SYRINGES  Prior Approval: Yes([phone])  Deductible: (NO DEDUCTIBLE WITH PLAN)  Additional Notes: LOVENOX   40 MG DAILY  X 14 SYRINGES X 14 DAYS . NOT COVER  and  NON-FORMULARY , P/A-YES  # [id number]    Memory Argue Phone Number: 12/31/2019, 12:54 PM

## 2019-12-31 NOTE — Progress Notes (Signed)
Physical Therapy Treatment Patient Details Name: Kathleen Everett MRN: [id number] DOB: 11-Dec-1970 Today's Date: 12/31/2019    History of Present Illness 49yo female pt s/p R THA 12/28/2019, anterior approach, WBAT. PMHx includes anxiety, depression, DDD lumbar, GERD, HLD, chronic tension headaches.    PT Comments    Pt was in bed upon arriving and agrees to PT session requesting to have temp check. Temp 101.6. RN notified and issued meds. Pt reports she still wanted to perform stair training this afternoon and was agreeable to PT session. Continues to report 6/10 pain at rest that elevates to 7-8/10. She exited L side of bed with supervision only. Pt then stood and ambulated with CGA for safety with recliner follow. Once seated in recliner pt was pushed to rehab gym and performed stair training. Therapist demonstrated and then pt perform ascending/descending stairs with BUE support on rails while using step to pattern. Pt was able to safely perform with CGA + gait belt for safety. She tolerated well and reports feeling confident she can perform at home when d/c. Will continue to follow per PT POC and progress pt as able. She has personal RW in room and [facility]. Pt was left in bed with call bell in reach, bed alarm set, ice pack donned, and SCDs running.    Follow Up Recommendations  Home health PT     Equipment Recommendations  None recommended by PT    Recommendations for Other Services       Precautions / Restrictions Precautions Precautions: Anterior Hip;Fall Precaution Booklet Issued: Yes (comment) Other Brace: wound vac Restrictions Weight Bearing Restrictions: Yes RLE Weight Bearing: Weight bearing as tolerated    Mobility  Bed Mobility Overal bed mobility: Needs Assistance Bed Mobility: Supine to Sit;Sit to Supine     Supine to sit: Supervision Sit to supine: Min assist;HOB elevated   General bed mobility comments: Pt was able to exit L side of bed without assistance but  continues to require min A with sit to supine with therapist supporting RLE  Transfers Overall transfer level: Modified independent Equipment used: Rolling walker (2 wheeled) Transfers: Sit to/from Stand Sit to Stand: Min guard         General transfer comment: CGA for safety only  Ambulation/Gait Ambulation/Gait assistance: Min guard Gait Distance (Feet): 60 Feet Assistive device: Rolling walker (2 wheeled) Gait Pattern/deviations: Step-to pattern;Trunk flexed Gait velocity: decreased   General Gait Details: Pt continues to have difficulty advancing RLE but is steady on her feet. Gait belt used for safety. Pt relies heavily on RW for support to decrease LE wt bearing   Stairs Stairs: Yes Stairs assistance: Min guard Stair Management: Two rails;Step to pattern Number of Stairs: 4 General stair comments: Pt was able to ascend/descend 4 stair with BUE support on rails and step to pattern. Therapist demonstrated then pt performed. She tolerated well and reports she feels confident she can perform when d/c.   Wheelchair Mobility    Modified Rankin (Stroke Patients Only)       Balance                                            Cognition Arousal/Alertness: Awake/alert Behavior During Therapy: WFL for tasks assessed/performed Overall Cognitive Status: Within Functional Limits for tasks assessed  General Comments: Pt is A and O x 4. Cooperative and pleasant      Exercises Total Joint Exercises Ankle Circles/Pumps: AROM;Both;20 reps;Supine Quad Sets: AROM;Right;10 reps;Supine Gluteal Sets: AROM;Supine;10 reps Heel Slides: AROM;Right;10 reps Hip ABduction/ADduction: AAROM;Right    General Comments        Pertinent Vitals/Pain Pain Assessment: 0-10 Pain Score: 6  Pain Location: R hip Pain Descriptors / Indicators: Aching;Grimacing;Guarding;Sharp Pain Intervention(s): Limited activity within  patient's tolerance;Monitored during session;Premedicated before session;Ice applied;RN gave pain meds during session    Home Living                      Prior Function            PT Goals (current goals can now be found in the care plan section) Acute Rehab PT Goals Patient Stated Goal: " I want to go home if I'm safe and ready." Progress towards PT goals: Progressing toward goals    Frequency    BID      PT Plan Current plan remains appropriate    Co-evaluation              AM-PAC PT "6 Clicks" Mobility   Outcome Measure  Help needed turning from your back to your side while in a flat bed without using bedrails?: None Help needed moving from lying on your back to sitting on the side of a flat bed without using bedrails?: A Little Help needed moving to and from a bed to a chair (including a wheelchair)?: A Little Help needed standing up from a chair using your arms (e.g., wheelchair or bedside chair)?: A Little Help needed to walk in hospital room?: A Little Help needed climbing 3-5 steps with a railing? : A Little 6 Click Score: 19    End of Session Equipment Utilized During Treatment: Gait belt Activity Tolerance: Patient tolerated treatment well Patient left: in bed;with bed alarm set;with call bell/phone within reach Nurse Communication: Mobility status PT Visit Diagnosis: Unsteadiness on feet (R26.81);Muscle weakness (generalized) (M62.81);Difficulty in walking, not elsewhere classified (R26.2);Pain Pain - Right/Left: Right Pain - part of body: Hip     Time: 1355-1430 PT Time Calculation (min) (ACUTE ONLY): 35 min  Charges:  $Gait Training: 8-22 mins $Therapeutic Exercise: 8-22 mins                     Julaine Fusi PTA 12/31/19, 3:50 PM

## 2020-01-01 LAB — CBC
HCT: 28.3 % — ABNORMAL LOW (ref 36.0–46.0)
Hemoglobin: 8.9 g/dL — ABNORMAL LOW (ref 12.0–15.0)
MCH: 28.4 pg (ref 26.0–34.0)
MCHC: 31.4 g/dL (ref 30.0–36.0)
MCV: 90.4 fL (ref 80.0–100.0)
Platelets: 236 10*3/uL (ref 150–400)
RBC: 3.13 MIL/uL — ABNORMAL LOW (ref 3.87–5.11)
RDW: 13.5 % (ref 11.5–15.5)
WBC: 6.6 10*3/uL (ref 4.0–10.5)
nRBC: 0 % (ref 0.0–0.2)

## 2020-01-01 NOTE — Progress Notes (Signed)
Patient is being discharged to home with [facility]/PT. DC & Rx instructions given and patient acknowledged understanding. Hard copy Rx put in patient's dc packet. IV removed. Belongings packed.

## 2020-01-01 NOTE — Progress Notes (Signed)
Physical Therapy Treatment Patient Details Name: Kathleen Everett MRN: [id number] DOB: [birth year] Today's Date: 01/01/2020    History of Present Illness 49yo female pt s/p R THA 12/28/2019, anterior approach, WBAT. PMHx includes anxiety, depression, DDD lumbar, GERD, HLD, chronic tension headaches.    PT Comments    Pt was in bed upon arriving and agrees to PT session. She is A and O x 4 and reports 5/10 R hip pain. She was pleasant and cooperative throughout session. Pt exited L side of bed with supervision but does require Min assist return to bed from seated position 2/2 pain/strength deficits. Pt is able to stand and ambulate with supervision only. No LOB or unsteadiness. Continued difficulty with clearing RLE during ambulation. She was able to tolerate ambulating ~ 160 ft with RW + gait belt for safety. Once returned to room and bed, pt performed  anterior hip handout exercises and tolerated well. She continues to improve and progress with PT. Recommending pt D/C to home with HHPT when medically stable. Therapist will return later this date per PT POC.     Follow Up Recommendations  Home health PT     Equipment Recommendations  Other (comment)(pt has her personal equipment in room already)    Recommendations for Other Services       Precautions / Restrictions Precautions Precautions: Anterior Hip;Fall Precaution Booklet Issued: Yes (comment) Other Brace: wound vac Restrictions Weight Bearing Restrictions: Yes RLE Weight Bearing: Weight bearing as tolerated    Mobility  Bed Mobility Overal bed mobility: Modified Independent;Needs Assistance Bed Mobility: Supine to Sit;Sit to Supine     Supine to sit: Supervision Sit to supine: Min assist;HOB elevated   General bed mobility comments: Pt was able to exit L side of bed with supervision but continues to require min assist to return to supine from sitting at EOB 2/2 pain/strength deficits  Transfers Overall transfer  level: Modified independent Equipment used: Rolling walker (2 wheeled) Transfers: Sit to/from Stand Sit to Stand: Supervision         General transfer comment: Pt was able to stand from EOB without physical assistance.  Ambulation/Gait Ambulation/Gait assistance: Supervision Gait Distance (Feet): 160 Feet Assistive device: Rolling walker (2 wheeled) Gait Pattern/deviations: Step-to pattern;Trunk flexed Gait velocity: decreased   General Gait Details: Pt demonstrates no LOB or unsteadiness with gait however continues to be unable to tolerate/ or able to clear RLE from floor during swing phase of gait.   Stairs             Wheelchair Mobility    Modified Rankin (Stroke Patients Only)       Balance                                            Cognition Arousal/Alertness: Awake/alert Behavior During Therapy: WFL for tasks assessed/performed Overall Cognitive Status: Within Functional Limits for tasks assessed                                 General Comments: Pt is A and O x 4. Cooperative and pleasant      Exercises Total Joint Exercises Ankle Circles/Pumps: AROM;Both;20 reps;Supine Quad Sets: AROM;Right;10 reps;Supine Gluteal Sets: AROM;Supine;10 reps Towel Squeeze: AROM;10 reps;Supine Short Arc Quad: AROM;Right;10 reps;Supine Heel Slides: AROM;10 reps;Supine Hip ABduction/ADduction: AAROM;Right;10 reps;Supine    General  Comments        Pertinent Vitals/Pain Pain Assessment: 0-10 Pain Score: 6  Pain Location: R hip Pain Descriptors / Indicators: Cramping;Guarding;Grimacing;Sharp Pain Intervention(s): Limited activity within patient's tolerance;Premedicated before session;Monitored during session;Ice applied    Home Living                      Prior Function            PT Goals (current goals can now be found in the care plan section) Acute Rehab PT Goals Patient Stated Goal: " I want to go home, I've been  here long enough." Progress towards PT goals: Progressing toward goals    Frequency    BID      PT Plan Current plan remains appropriate    Co-evaluation              AM-PAC PT "6 Clicks" Mobility   Outcome Measure  Help needed turning from your back to your side while in a flat bed without using bedrails?: None Help needed moving from lying on your back to sitting on the side of a flat bed without using bedrails?: A Little Help needed moving to and from a bed to a chair (including a wheelchair)?: A Little Help needed standing up from a chair using your arms (e.g., wheelchair or bedside chair)?: A Little Help needed to walk in hospital room?: A Little Help needed climbing 3-5 steps with a railing? : A Little 6 Click Score: 19    End of Session Equipment Utilized During Treatment: Gait belt Activity Tolerance: Patient tolerated treatment well Patient left: in bed;with bed alarm set;with call bell/phone within reach Nurse Communication: Mobility status PT Visit Diagnosis: Unsteadiness on feet (R26.81);Muscle weakness (generalized) (M62.81);Difficulty in walking, not elsewhere classified (R26.2);Pain Pain - Right/Left: Right Pain - part of body: Hip     Time: [id number] PT Time Calculation (min) (ACUTE ONLY): 24 min  Charges:  $Gait Training: 8-22 mins $Therapeutic Exercise: 8-22 mins                     Julaine Fusi PTA 01/01/20, 10:36 AM

## 2020-01-01 NOTE — Progress Notes (Signed)
  Subjective: 4 Days Post-Op Procedure(s) (LRB): TOTAL HIP ARTHROPLASTY ANTERIOR APPROACH (Right) Patient reports pain as moderate.   Patient is well, and has had no acute complaints or problems Plan is to go Home after hospital stay. Negative for chest pain and shortness of breath Gastrointestinal: negative for nausea and vomiting.  Patient has had a bowel movement.  Objective: Vital signs in last 24 hours: Temp:  [98.9 F (37.2 C)-100.5 F (38.1 C)] 99.1 F (37.3 C) (02/13 0733) Pulse Rate:  [76-93] 76 (02/13 0733) Resp:  [17-20] 18 (02/13 0733) BP: (105-119)/(52-71) 105/52 (02/13 0733) SpO2:  [95 %-99 %] 99 % (02/13 0733)  Intake/Output from previous day:  Intake/Output Summary (Last 24 hours) at 01/01/2020 1424 Last data filed at 01/01/2020 1300 Gross per 24 hour  Intake 480 ml  Output -  Net 480 ml    Intake/Output this shift: Total I/O In: 480 [P.O.:480] Out: -   Labs: Recent Labs    12/31/19 0620 01/01/20 0443  HGB 9.1* 8.9*   Recent Labs    12/31/19 0620 01/01/20 0443  WBC 6.9 6.6  RBC 3.17* 3.13*  HCT 28.4* 28.3*  PLT 191 236   No results for input(s): NA, K, CL, CO2, BUN, CREATININE, GLUCOSE, CALCIUM in the last 72 hours. No results for input(s): LABPT, INR in the last 72 hours.   EXAM General - Patient is Alert, Appropriate and Oriented Extremity - Neurovascular intact Dorsiflexion/Plantar flexion intact Compartment soft Dressing/Incision -Praveena wound vac in place. Motor Function - intact, moving foot and toes well on exam.  Cardiovascular- Regular rate and rhythm, no murmurs/rubs/gallops Respiratory- Lungs clear to auscultation bilaterally Gastrointestinal- soft, nontender and active bowel sounds   Assessment/Plan: 4 Days Post-Op Procedure(s) (LRB): TOTAL HIP ARTHROPLASTY ANTERIOR APPROACH (Right) Active Problems:   Status post total hip replacement, right  Estimated body mass index is 38.47 kg/m as calculated from the following:   Height as of this encounter: 5\' 10"  (1.778 m).   Weight as of this encounter: 121.6 kg. Discharge home with home health    DVT Prophylaxis - Lovenox, SCDs, TED hose  Weight-Bearing as tolerated to right leg  Cassell Smiles, PA-C [facility] Orthopaedic Surgery 01/01/2020, 2:24 PM
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
## 2020-01-01 NOTE — Progress Notes (Signed)
PT Cancellation Note  Patient Details Name: Kathleen Everett MRN: [id number] DOB: [birth year]   Cancelled Treatment:     Pt just returned to bed from ambulating to BR. Per Pt/Tech, Rn is about to do wound vac change then pt will be D/C'd. Pt reports feeling confident in her ability to safely return home. HHPT will start tomorrow morning at mothers house.    Willette Pa 01/01/2020, 3:01 PM

## 2020-01-02 ENCOUNTER — Encounter: Payer: Self-pay | Admitting: Internal Medicine

## 2020-01-02 DIAGNOSIS — I058 Other rheumatic mitral valve diseases: Secondary | ICD-10-CM | POA: Diagnosis not present

## 2020-01-02 DIAGNOSIS — Z0000 Encounter for general adult medical examination without abnormal findings: Secondary | ICD-10-CM | POA: Insufficient documentation

## 2020-01-02 DIAGNOSIS — Z471 Aftercare following joint replacement surgery: Secondary | ICD-10-CM | POA: Diagnosis not present

## 2020-01-02 DIAGNOSIS — Q0700 Arnold-Chiari syndrome without spina bifida or hydrocephalus: Secondary | ICD-10-CM | POA: Diagnosis not present

## 2020-01-02 DIAGNOSIS — M1991 Primary osteoarthritis, unspecified site: Secondary | ICD-10-CM | POA: Diagnosis not present

## 2020-01-02 DIAGNOSIS — G44221 Chronic tension-type headache, intractable: Secondary | ICD-10-CM | POA: Diagnosis not present

## 2020-01-02 DIAGNOSIS — G43909 Migraine, unspecified, not intractable, without status migrainosus: Secondary | ICD-10-CM | POA: Diagnosis not present

## 2020-01-02 DIAGNOSIS — D649 Anemia, unspecified: Secondary | ICD-10-CM | POA: Insufficient documentation

## 2020-01-02 DIAGNOSIS — Z1239 Encounter for other screening for malignant neoplasm of breast: Secondary | ICD-10-CM | POA: Insufficient documentation

## 2020-01-02 DIAGNOSIS — K2950 Unspecified chronic gastritis without bleeding: Secondary | ICD-10-CM | POA: Diagnosis not present

## 2020-01-02 DIAGNOSIS — M5136 Other intervertebral disc degeneration, lumbar region: Secondary | ICD-10-CM | POA: Diagnosis not present

## 2020-01-02 DIAGNOSIS — E538 Deficiency of other specified B group vitamins: Secondary | ICD-10-CM | POA: Diagnosis not present

## 2020-01-02 HISTORY — DX: Anemia, unspecified: D64.9

## 2020-01-02 NOTE — Assessment & Plan Note (Signed)
Has been followed at pain clinic.

## 2020-01-02 NOTE — Assessment & Plan Note (Signed)
Check a f/u cbc.  Will check iron studies and B12 as well.

## 2020-01-02 NOTE — Assessment & Plan Note (Signed)
Controlled.  On omeprazole.   

## 2020-01-02 NOTE — Assessment & Plan Note (Signed)
Off lipitor currently.  Low cholesterol diet.  Follow lipid panel.  Will plan for recheck. If persistent elevation and once past surgery, may need to restart statin medication.

## 2020-01-02 NOTE — Assessment & Plan Note (Signed)
Persistent. Planning for hip surgery tomorrow.  Denies any chest pain.  Had covid.  Minimal sob, but breathing overall stable.  Will check cxr to confirm clear prior to surgery.  Feels may be related to deconditioning.

## 2020-01-02 NOTE — Assessment & Plan Note (Signed)
S/p surgery.  Has seen neurology.  Have discussed referral back.  Has wanted to postpone previously until insurance straightened out.  Has surgery planned for tomorrow.  Will notify me when agreeable.

## 2020-01-02 NOTE — Assessment & Plan Note (Signed)
Low carb diet and exercise.  Follow met b and a1c.

## 2020-01-02 NOTE — Assessment & Plan Note (Signed)
Has seen psychiatry.  Will need to arrange f/u with psychiatry.

## 2020-01-02 NOTE — Assessment & Plan Note (Signed)
Will need mammogram when able.

## 2020-01-02 NOTE — Assessment & Plan Note (Signed)
Schedule f/u with psychiatry.

## 2020-01-03 DIAGNOSIS — M5136 Other intervertebral disc degeneration, lumbar region: Secondary | ICD-10-CM | POA: Diagnosis not present

## 2020-01-03 DIAGNOSIS — G44221 Chronic tension-type headache, intractable: Secondary | ICD-10-CM | POA: Diagnosis not present

## 2020-01-03 DIAGNOSIS — G43909 Migraine, unspecified, not intractable, without status migrainosus: Secondary | ICD-10-CM | POA: Diagnosis not present

## 2020-01-03 DIAGNOSIS — M1991 Primary osteoarthritis, unspecified site: Secondary | ICD-10-CM | POA: Diagnosis not present

## 2020-01-03 DIAGNOSIS — E538 Deficiency of other specified B group vitamins: Secondary | ICD-10-CM | POA: Diagnosis not present

## 2020-01-03 DIAGNOSIS — K2950 Unspecified chronic gastritis without bleeding: Secondary | ICD-10-CM | POA: Diagnosis not present

## 2020-01-03 DIAGNOSIS — Z471 Aftercare following joint replacement surgery: Secondary | ICD-10-CM | POA: Diagnosis not present

## 2020-01-03 DIAGNOSIS — I058 Other rheumatic mitral valve diseases: Secondary | ICD-10-CM | POA: Diagnosis not present

## 2020-01-03 DIAGNOSIS — Q0700 Arnold-Chiari syndrome without spina bifida or hydrocephalus: Secondary | ICD-10-CM | POA: Diagnosis not present

## 2020-01-04 DIAGNOSIS — G894 Chronic pain syndrome: Secondary | ICD-10-CM | POA: Diagnosis not present

## 2020-01-04 DIAGNOSIS — M1611 Unilateral primary osteoarthritis, right hip: Secondary | ICD-10-CM | POA: Diagnosis not present

## 2020-01-04 DIAGNOSIS — M47816 Spondylosis without myelopathy or radiculopathy, lumbar region: Secondary | ICD-10-CM | POA: Diagnosis not present

## 2020-01-04 DIAGNOSIS — M5442 Lumbago with sciatica, left side: Secondary | ICD-10-CM | POA: Diagnosis not present

## 2020-01-05 DIAGNOSIS — G43909 Migraine, unspecified, not intractable, without status migrainosus: Secondary | ICD-10-CM | POA: Diagnosis not present

## 2020-01-05 DIAGNOSIS — Q0700 Arnold-Chiari syndrome without spina bifida or hydrocephalus: Secondary | ICD-10-CM | POA: Diagnosis not present

## 2020-01-05 DIAGNOSIS — I058 Other rheumatic mitral valve diseases: Secondary | ICD-10-CM | POA: Diagnosis not present

## 2020-01-05 DIAGNOSIS — G44221 Chronic tension-type headache, intractable: Secondary | ICD-10-CM | POA: Diagnosis not present

## 2020-01-05 DIAGNOSIS — K2950 Unspecified chronic gastritis without bleeding: Secondary | ICD-10-CM | POA: Diagnosis not present

## 2020-01-05 DIAGNOSIS — Z471 Aftercare following joint replacement surgery: Secondary | ICD-10-CM | POA: Diagnosis not present

## 2020-01-05 DIAGNOSIS — M1991 Primary osteoarthritis, unspecified site: Secondary | ICD-10-CM | POA: Diagnosis not present

## 2020-01-05 DIAGNOSIS — M5136 Other intervertebral disc degeneration, lumbar region: Secondary | ICD-10-CM | POA: Diagnosis not present

## 2020-01-05 DIAGNOSIS — E538 Deficiency of other specified B group vitamins: Secondary | ICD-10-CM | POA: Diagnosis not present

## 2020-01-07 DIAGNOSIS — G44221 Chronic tension-type headache, intractable: Secondary | ICD-10-CM | POA: Diagnosis not present

## 2020-01-07 DIAGNOSIS — Z471 Aftercare following joint replacement surgery: Secondary | ICD-10-CM | POA: Diagnosis not present

## 2020-01-07 DIAGNOSIS — Q0700 Arnold-Chiari syndrome without spina bifida or hydrocephalus: Secondary | ICD-10-CM | POA: Diagnosis not present

## 2020-01-07 DIAGNOSIS — G43909 Migraine, unspecified, not intractable, without status migrainosus: Secondary | ICD-10-CM | POA: Diagnosis not present

## 2020-01-07 DIAGNOSIS — M5136 Other intervertebral disc degeneration, lumbar region: Secondary | ICD-10-CM | POA: Diagnosis not present

## 2020-01-07 DIAGNOSIS — I058 Other rheumatic mitral valve diseases: Secondary | ICD-10-CM | POA: Diagnosis not present

## 2020-01-07 DIAGNOSIS — M1991 Primary osteoarthritis, unspecified site: Secondary | ICD-10-CM | POA: Diagnosis not present

## 2020-01-07 DIAGNOSIS — E538 Deficiency of other specified B group vitamins: Secondary | ICD-10-CM | POA: Diagnosis not present

## 2020-01-07 DIAGNOSIS — K2950 Unspecified chronic gastritis without bleeding: Secondary | ICD-10-CM | POA: Diagnosis not present

## 2020-01-10 ENCOUNTER — Other Ambulatory Visit: Payer: Self-pay

## 2020-01-10 DIAGNOSIS — G43909 Migraine, unspecified, not intractable, without status migrainosus: Secondary | ICD-10-CM | POA: Diagnosis not present

## 2020-01-10 DIAGNOSIS — I058 Other rheumatic mitral valve diseases: Secondary | ICD-10-CM | POA: Diagnosis not present

## 2020-01-10 DIAGNOSIS — E538 Deficiency of other specified B group vitamins: Secondary | ICD-10-CM | POA: Diagnosis not present

## 2020-01-10 DIAGNOSIS — Z471 Aftercare following joint replacement surgery: Secondary | ICD-10-CM | POA: Diagnosis not present

## 2020-01-10 DIAGNOSIS — G44221 Chronic tension-type headache, intractable: Secondary | ICD-10-CM | POA: Diagnosis not present

## 2020-01-10 DIAGNOSIS — M1991 Primary osteoarthritis, unspecified site: Secondary | ICD-10-CM | POA: Diagnosis not present

## 2020-01-10 DIAGNOSIS — Q0700 Arnold-Chiari syndrome without spina bifida or hydrocephalus: Secondary | ICD-10-CM | POA: Diagnosis not present

## 2020-01-10 DIAGNOSIS — M5136 Other intervertebral disc degeneration, lumbar region: Secondary | ICD-10-CM | POA: Diagnosis not present

## 2020-01-10 DIAGNOSIS — K2950 Unspecified chronic gastritis without bleeding: Secondary | ICD-10-CM | POA: Diagnosis not present

## 2020-01-10 NOTE — Patient Outreach (Signed)
[facility]) Care Management  01/10/2020  Kathleen Everett 28-Nov-1970 [id number]   Telephone Screen  Referral Date: 01/10/2020 Referral Source: [facility] Nurse Line Referral Reason: "member with recent total hip replacement, ordered MOM meals,needs to be screened for [facility] meals, new diagnosis of depression" Insurance: [employer] and [employer] attempt # 1 to patient. Spoke with patient and reviewed and discussed referral. Patient confirms that she had delivered to her home already the MOM meals. She was told about another meal program-she thinks its the [employer] program. Patient reports that the person she talked to states she needed [employer] CM to approve this. Patient unsure if she wants to go through the process and if she even needs extra meals. She feels like she will be able to perform ADLs/IADLs soon as she is recovering. RN CM provided contact info for agency. RN CM offered SW referral for further resources. Patient does not feel lie it is necessary. RN CM also addressed depression issue Patient denies any acute sxs. She reports that she will be talking with PCP about referral to [facility] in network since she just switched to [facility]. She declined SW assistance for [facility] resources. Patient provide with RN CM contact info and aware that she may call later if needed. She voiced understanding.      Plan: RN CM will close case at this time.    Enzo Montgomery, RN,BSN,CCM [facility] Management Telephonic Care Management Coordinator Direct Phone: [phone] Toll Free: [phone] Fax: [phone]

## 2020-01-13 DIAGNOSIS — E538 Deficiency of other specified B group vitamins: Secondary | ICD-10-CM | POA: Diagnosis not present

## 2020-01-13 DIAGNOSIS — G44221 Chronic tension-type headache, intractable: Secondary | ICD-10-CM | POA: Diagnosis not present

## 2020-01-13 DIAGNOSIS — I058 Other rheumatic mitral valve diseases: Secondary | ICD-10-CM | POA: Diagnosis not present

## 2020-01-13 DIAGNOSIS — M1991 Primary osteoarthritis, unspecified site: Secondary | ICD-10-CM | POA: Diagnosis not present

## 2020-01-13 DIAGNOSIS — Q0700 Arnold-Chiari syndrome without spina bifida or hydrocephalus: Secondary | ICD-10-CM | POA: Diagnosis not present

## 2020-01-13 DIAGNOSIS — M5136 Other intervertebral disc degeneration, lumbar region: Secondary | ICD-10-CM | POA: Diagnosis not present

## 2020-01-13 DIAGNOSIS — G43909 Migraine, unspecified, not intractable, without status migrainosus: Secondary | ICD-10-CM | POA: Diagnosis not present

## 2020-01-13 DIAGNOSIS — Z471 Aftercare following joint replacement surgery: Secondary | ICD-10-CM | POA: Diagnosis not present

## 2020-01-13 DIAGNOSIS — K2950 Unspecified chronic gastritis without bleeding: Secondary | ICD-10-CM | POA: Diagnosis not present

## 2020-01-17 DIAGNOSIS — M5136 Other intervertebral disc degeneration, lumbar region: Secondary | ICD-10-CM | POA: Diagnosis not present

## 2020-01-17 DIAGNOSIS — Z471 Aftercare following joint replacement surgery: Secondary | ICD-10-CM | POA: Diagnosis not present

## 2020-01-17 DIAGNOSIS — I058 Other rheumatic mitral valve diseases: Secondary | ICD-10-CM | POA: Diagnosis not present

## 2020-01-17 DIAGNOSIS — Q0700 Arnold-Chiari syndrome without spina bifida or hydrocephalus: Secondary | ICD-10-CM | POA: Diagnosis not present

## 2020-01-17 DIAGNOSIS — M1991 Primary osteoarthritis, unspecified site: Secondary | ICD-10-CM | POA: Diagnosis not present

## 2020-01-17 DIAGNOSIS — G43909 Migraine, unspecified, not intractable, without status migrainosus: Secondary | ICD-10-CM | POA: Diagnosis not present

## 2020-01-17 DIAGNOSIS — E538 Deficiency of other specified B group vitamins: Secondary | ICD-10-CM | POA: Diagnosis not present

## 2020-01-17 DIAGNOSIS — K2950 Unspecified chronic gastritis without bleeding: Secondary | ICD-10-CM | POA: Diagnosis not present

## 2020-01-17 DIAGNOSIS — G44221 Chronic tension-type headache, intractable: Secondary | ICD-10-CM | POA: Diagnosis not present

## 2020-01-19 DIAGNOSIS — Z471 Aftercare following joint replacement surgery: Secondary | ICD-10-CM | POA: Diagnosis not present

## 2020-01-19 DIAGNOSIS — G43909 Migraine, unspecified, not intractable, without status migrainosus: Secondary | ICD-10-CM | POA: Diagnosis not present

## 2020-01-19 DIAGNOSIS — Q0700 Arnold-Chiari syndrome without spina bifida or hydrocephalus: Secondary | ICD-10-CM | POA: Diagnosis not present

## 2020-01-19 DIAGNOSIS — M5136 Other intervertebral disc degeneration, lumbar region: Secondary | ICD-10-CM | POA: Diagnosis not present

## 2020-01-19 DIAGNOSIS — M1991 Primary osteoarthritis, unspecified site: Secondary | ICD-10-CM | POA: Diagnosis not present

## 2020-01-19 DIAGNOSIS — E538 Deficiency of other specified B group vitamins: Secondary | ICD-10-CM | POA: Diagnosis not present

## 2020-01-19 DIAGNOSIS — I058 Other rheumatic mitral valve diseases: Secondary | ICD-10-CM | POA: Diagnosis not present

## 2020-01-19 DIAGNOSIS — G44221 Chronic tension-type headache, intractable: Secondary | ICD-10-CM | POA: Diagnosis not present

## 2020-01-19 DIAGNOSIS — K2950 Unspecified chronic gastritis without bleeding: Secondary | ICD-10-CM | POA: Diagnosis not present

## 2020-01-20 DIAGNOSIS — Z79899 Other long term (current) drug therapy: Secondary | ICD-10-CM | POA: Diagnosis not present

## 2020-01-20 DIAGNOSIS — Z5181 Encounter for therapeutic drug level monitoring: Secondary | ICD-10-CM | POA: Diagnosis not present

## 2020-01-31 ENCOUNTER — Encounter: Payer: Self-pay | Admitting: Internal Medicine

## 2020-02-01 NOTE — Telephone Encounter (Signed)
Yes, ok to schedule.  [place] if desires virtual.

## 2020-02-11 DIAGNOSIS — M1611 Unilateral primary osteoarthritis, right hip: Secondary | ICD-10-CM | POA: Diagnosis not present

## 2020-02-14 ENCOUNTER — Encounter: Payer: Self-pay | Admitting: Internal Medicine

## 2020-02-14 ENCOUNTER — Other Ambulatory Visit: Payer: Self-pay

## 2020-02-14 ENCOUNTER — Ambulatory Visit (INDEPENDENT_AMBULATORY_CARE_PROVIDER_SITE_OTHER): Payer: Medicare HMO

## 2020-02-14 VITALS — HR 81 | Ht 70.0 in | Wt 268.0 lb

## 2020-02-14 DIAGNOSIS — Z1231 Encounter for screening mammogram for malignant neoplasm of breast: Secondary | ICD-10-CM | POA: Diagnosis not present

## 2020-02-14 DIAGNOSIS — Z0000 Encounter for general adult medical examination without abnormal findings: Secondary | ICD-10-CM

## 2020-02-14 NOTE — Progress Notes (Addendum)
Subjective:   Kathleen Everett is a 49 y.o. female who presents for an Initial Medicare Annual Wellness Visit.  Review of Systems    No ROS.  Medicare Wellness Virtual Visit.  Visual/audio telehealth visit, UTA vital signs.   Ht/Wt provided.  See social history for additional risk factors.    Cardiac Risk Factors include: advanced age (>35men, >29 women)     Objective:    Today's Vitals   02/14/20 1305  Pulse: 81  SpO2: 98%  Weight: 268 lb (121.6 kg)  Height: 5\' 10"  (1.778 m)   Body mass index is 38.45 kg/m.  Advanced Directives 02/14/2020 12/28/2019 12/02/2019 04/12/2017 08/17/2015 06/15/2015 04/28/2015  Does Patient Have a Medical Advance Directive? No No No No No No No  Would patient like information on creating a medical advance directive? No - Patient declined No - Patient declined No - Guardian declined No - Patient declined - Yes - Scientist, clinical (histocompatibility and immunogenetics) given -  Some encounter information is confidential and restricted. Go to Review Flowsheets activity to see all data.    Current Medications (verified) Outpatient Encounter Medications as of 02/14/2020  Medication Sig  . acetaminophen (TYLENOL) 500 MG tablet Take 500-1,000 mg by mouth every 6 (six) hours as needed (for pain.).   Marland Kitchen Ascorbic Acid (VITAMIN C) 500 MG CHEW Chew 500 mg by mouth daily.  . calcium carbonate (TUMS - DOSED IN MG ELEMENTAL CALCIUM) 500 MG chewable tablet Chew 1-2 tablets by mouth 3 (three) times daily as needed for indigestion or heartburn.  . cyanocobalamin (,VITAMIN B-12,) 1000 MCG/ML injection INJECT ONE ML (CC) INTRAMUSCULARLY ONCE EVERY MONTH (Patient taking differently: Inject 1,000 mcg into the muscle every 30 (thirty) days. )  . diazepam (VALIUM) 5 MG tablet Take 1 tablet (5 mg total) by mouth every 8 (eight) hours as needed for muscle spasms. (Patient taking differently: Take 2.5-5 mg by mouth daily as needed for muscle spasms. )  . diclofenac sodium (VOLTAREN) 1 % GEL Apply 1 application  topically 4 (four) times daily as needed (pain.).   Marland Kitchen gabapentin (NEURONTIN) 400 MG capsule Take 400 mg by mouth 3 (three) times daily.  Marland Kitchen lactase (RA DAIRY AID) 3000 units tablet Take 3,000-6,000 Units by mouth 3 (three) times daily as needed (wit dairy consumption).  . methocarbamol (ROBAXIN) 500 MG tablet Take 1 tablet (500 mg total) by mouth every 6 (six) hours as needed for muscle spasms.  Marland Kitchen nystatin cream (MYCOSTATIN) APPLY TOPICALLY TWICE DAILY (Patient taking differently: Apply 1 application topically 2 (two) times daily as needed (heat rash). )  . omeprazole (PRILOSEC) 20 MG capsule Take 20 mg by mouth daily.  Marland Kitchen oxyCODONE (ROXICODONE) 5 MG immediate release tablet Take 2-3 tablets (10-15 mg total) by mouth every 6 (six) hours as needed for severe pain.  . Oxycodone HCl 10 MG TABS Take 10 mg by mouth 5 (five) times daily.   . simethicone (GAS RELIEF) 80 MG chewable tablet Chew 80 mg by mouth every 6 (six) hours as needed for flatulence.  . TOPAMAX 25 MG tablet Take 25 mg by mouth at bedtime.  . [DISCONTINUED] azithromycin (ZITHROMAX) 250 MG tablet Take 1 tablet daily x 4 days  . [DISCONTINUED] docusate sodium (COLACE) 100 MG capsule Take 1 capsule (100 mg total) by mouth 2 (two) times daily.  . [DISCONTINUED] enoxaparin (LOVENOX) 40 MG/0.4ML injection Inject 0.4 mLs (40 mg total) into the skin daily for 14 days.   No facility-administered encounter medications on file as of 02/14/2020.  Allergies (verified) Vancomycin, Ceftin [cefuroxime axetil], Celexa [citalopram hydrobromide], Clindamycin/lincomycin, Penicillins, and Sulfa antibiotics   History: Past Medical History:  Diagnosis Date  . Back pain   . Chiari malformation    s/p sgy decompression  . Chronic tension headaches   . Complication of anesthesia   . DDD (degenerative disc disease), lumbar   . Depression   . Diverticulitis   . Dysmenorrhea   . Endometriosis   . Gas pain   . GERD (gastroesophageal reflux disease)     . Hidradenitis   . Hyperlipidemia   . Obesity   . Osteoarthritis   . Ovarian cyst    hx  . PONV (postoperative nausea and vomiting)   . Sleep apnea    central sleep apnea  No CPAP right now  . Vitamin B12 deficiency    On injections   Past Surgical History:  Procedure Laterality Date  . ABDOMINAL HYSTERECTOMY  03/27/07   laparoscopic  . BREAST EXCISIONAL BIOPSY Left 2004   fibroadenoma  . CESAREAN SECTION     1999/2004 twins  . chiari decompression    . COLONOSCOPY WITH PROPOFOL N/A 04/28/2015   Procedure: COLONOSCOPY WITH PROPOFOL;  Surgeon: Manya Silvas, MD;  Location: [facility] ENDOSCOPY;  Service: Endoscopy;  Laterality: N/A;  . CYSTOSCOPY     bladder ulcers  . ESOPHAGOGASTRODUODENOSCOPY N/A 04/28/2015   Procedure: ESOPHAGOGASTRODUODENOSCOPY (EGD);  Surgeon: Manya Silvas, MD;  Location[facility] ENDOSCOPY;  Service: Endoscopy;  Laterality: N/A;  . HERNIA REPAIR Right 2003   inguinal  . OVARIAN CYST REMOVAL  2001   right  . TEMPOROMANDIBULAR JOINT SURGERY  2000  . TOTAL HIP ARTHROPLASTY Right 12/28/2019   Procedure: TOTAL HIP ARTHROPLASTY ANTERIOR APPROACH;  Surgeon: Hessie Knows, MD;  Location: [facility] ORS;  Service: Orthopedics;  Laterality: Right;  . TUBAL LIGATION     Bilateral   Family History  Problem Relation Age of Onset  . Hyperlipidemia Mother   . Anxiety disorder Mother   . Atrial fibrillation Mother   . Hypertension Mother   . Anxiety disorder Father   . Stroke Father   . Hyperlipidemia Father   . Hypertension Father   . Congestive Heart Failure Father   . Depression Father   . Hyperlipidemia Brother   . Colon cancer Paternal Aunt   . Dementia Maternal Grandmother   . Dementia Paternal Grandmother   . Breast cancer Neg Hx    Social History   Socioeconomic History  . Marital status: Married    Spouse name: Not on file  . Number of children: 3  . Years of education: 61  . Highest education level: Not on file  Occupational History  . Not on file   Tobacco Use  . Smoking status: Never Smoker  . Smokeless tobacco: Never Used  Substance and Sexual Activity  . Alcohol use: Yes    Alcohol/week: 0.0 standard drinks    Comment: rarely  . Drug use: No  . Sexual activity: Yes    Birth control/protection: None  Other Topics Concern  . Not on file  Social History Narrative   Patient drinks 2-3 cups of caffeine daily.   Patient is right handed.    Social Determinants of Health   Financial Resource Strain: Low Risk   . Difficulty of Paying Living Expenses: Not very hard  Food Insecurity: No Food Insecurity  . Worried About Charity fundraiser in the Last Year: Never true  . Ran Out of Food in the Last Year:  Never true  Transportation Needs: No Transportation Needs  . Lack of Transportation (Medical): No  . Lack of Transportation (Non-Medical): No  Physical Activity: Insufficiently Active  . Days of Exercise per Week: 7 days  . Minutes of Exercise per Session: 10 min  Stress:   . Feeling of Stress :   Social Connections: Not Isolated  . Frequency of Communication with Friends and Family: More than three times a week  . Frequency of Social Gatherings with Friends and Family: More than three times a week  . Attends Religious Services: More than 4 times per year  . Active Member of Clubs or Organizations: Not on file  . Attends Archivist Meetings: More than 4 times per year  . Marital Status: Married    Tobacco Counseling Counseling given: Not Answered   Clinical Intake:  Pre-visit preparation completed: Yes           How often do you need to have someone help you when you read instructions, pamphlets, or other written materials from your doctor or pharmacy?: 1 - Never  Interpreter Needed?: No      Activities of Daily Living In your present state of health, do you have any difficulty performing the following activities: 02/14/2020 12/28/2019  Hearing? N N  Vision? N N  Difficulty concentrating or  making decisions? Y N  Walking or climbing stairs? Y Y  Comment Chronic back pain. Unsteady gait, cane in use -  Dressing or bathing? Y N  Doing errands, shopping? Y N  Preparing Food and eating ? Y -  Using the Toilet? N -  In the past six months, have you accidently leaked urine? N -  Do you have problems with loss of bowel control? N -  Managing your Medications? N -  Managing your Finances? Y -  Comment Husband assist -  Housekeeping or managing your Housekeeping? Y -  Comment Family assist -  Some recent data might be hidden     Immunizations and Health Maintenance  There is no immunization history on file for this patient. Health Maintenance Due  Topic Date Due  . TETANUS/TDAP  Never done  . MAMMOGRAM  09/17/2019    Patient Care Team: [name], MD as PCP - General (Internal Medicine) [name], MD as Referring Physician (Neurology) [name], MD as Consulting Physician (Cardiology)  Indicate any recent Medical Services you may have received from other than [facility] providers in the past year (date may be approximate).     Assessment:   This is a routine wellness examination for Kathleen Everett.  Nurse connected with patient 02/14/20 at  1:00 PM EDT by a telephone enabled telemedicine application and verified that I am speaking with the correct person using two identifiers. Patient stated full name and DOB. Patient gave permission to continue with virtual visit. Patient's location was at home and Nurse's location was at [place] office.   Patient is alert and oriented x3. Patient notes she does have difficulty focusing or concentrating. Patient likes to word search games for brain health.   Health Maintenance Due: -Tdap vaccine- deferred per patient preference. -Mammogram- consent given to order. See completed HM at the end of note.   Eye: Visual acuity not assessed. Virtual visit. Followed by their ophthalmologist.  Dental: Visits every 6 months.     Hearing: Demonstrates normal hearing during visit.  Safety:  Patient feels safe at home- yes Patient does have smoke detectors at home- yes Patient does wear  sunscreen or protective clothing when in direct sunlight - yes Patient does wear seat belt when in a moving vehicle - yes Patient drives- yes Adequate lighting in walkways free from debris- yes Grab bars and handrails used as appropriate- yes Ambulates with an assistive device- no, cane in use. Cell phone on person when ambulating outside of the home- yes  Social: Alcohol intake - yes      Smoking history- never   Smokers in home? none Illicit drug use? none  Medication: Taking as directed and without issues.  Pill box in use -yes  Husband manage -yes   Cpap not in use due to past insurance issues.   Covid-19: Precautions and sickness symptoms discussed. Wears mask, social distancing, hand hygiene as appropriate.   Activities of Daily Living Patient denies needing assistance with: feeding themselves, getting from bed to chair, getting to the toilet, managing money. Assisted by family with household chores, bathing/showering, dressing, and preparing meals as needed.   Discussed the importance of a healthy diet, water intake and the benefits of aerobic exercise.   Physical activity- post PT therapy exercises 3 times daily  Diet:  Regular; modified to diverticulitis  Water: good intake Caffeine: 3 cups of coffee  Other Providers Patient Care Team: [name], MD as PCP - General (Internal Medicine) [name], MD as Referring Physician (Neurology) [name], MD as Consulting Physician (Cardiology)  Hearing/Vision screen  Hearing Screening   125Hz  250Hz  500Hz  1000Hz  2000Hz  3000Hz  4000Hz  6000Hz  8000Hz   Right ear:           Left ear:           Comments: Patient is able to hear conversational tones without difficulty.  No issues reported.  Vision Screening Comments: Wears corrective  lenses Visual acuity not assessed, virtual visit.  They have seen their ophthalmologist in the last 12 months.     Dietary issues and exercise activities discussed:    Goals    . Follow up with Primary Care Provider     As needed      Depression Screen PHQ 2/9 Scores 02/14/2020 06/19/2018 09/30/2016 07/12/2016 09/20/2015 06/20/2015  PHQ - 2 Score 4 6 0 6 6 6   PHQ- 9 Score 8 20 - 19 19 24     Denies self harm or harm to others just sad at times. Okay to wait for follow up scheduled 02/24/20. Difficulty sleeping due to chronic pain.   Fall Risk Fall Risk  02/14/2020 06/19/2018 09/30/2016 07/12/2016 09/20/2015  Falls in the past year? 1 Yes No Yes No  Number falls in past yr: - 2 or more - 2 or more -  Injury with Fall? 0 No - Yes -  Risk Factor Category  - High Fall Risk - - -  Risk for fall due to : - Impaired balance/gait - - Impaired balance/gait  Follow up Falls prevention discussed;Falls evaluation completed - - - -    Timed Get Up and Go Performed no, virtual visit  Cognitive Function:     6CIT Screen 02/14/2020  What Year? 0 points  What month? 0 points  What time? 0 points  Count back from 20 0 points  Months in reverse 0 points  Repeat phrase 6 points  Total Score 6    Screening Tests Health Maintenance  Topic Date Due  . TETANUS/TDAP  Never done  . MAMMOGRAM  09/17/2019  . PAP SMEAR-Modifier  06/23/2022  . HIV Screening  Completed  . INFLUENZA VACCINE  Discontinued      Plan:   Keep all routine maintenance appointments.   Next scheduled lab 02/22/20 @ 830  Follow up 02/24/20. Requests to restart lipitor for depression.   Cpe 06/26/20  Call [phone] schedule mammogram.  Medicare Attestation I have personally reviewed: The patient's medical and social history Their use of alcohol, tobacco or illicit drugs Their current medications and supplements The patient's functional ability including ADLs,fall risks, home safety risks, cognitive, and hearing and  visual impairment Diet and physical activities Evidence for depression   I have reviewed and discussed with patient certain preventive protocols, quality metrics, and best practice recommendations.      Varney Biles, LPN   [id number]     Reviewed above information.  Will clarify request to restart lipitor.  Also contact if feel needs to be seen prior to 02/24/20.    Dr Nicki Reaper

## 2020-02-14 NOTE — Patient Instructions (Addendum)
  Ms. Want , Thank you for taking time to come for your Medicare Wellness Visit. I appreciate your ongoing commitment to your health goals. Please review the following plan we discussed and let me know if I can assist you in the future.   Call [phone] schedule mammogram.  These are the goals we discussed: Goals    . Follow up with Primary Care Provider     As needed       This is a list of the screening recommended for you and due dates:  Health Maintenance  Topic Date Due  . Tetanus Vaccine  Never done  . Mammogram  09/17/2019  . Pap Smear  06/23/2022  . HIV Screening  Completed  . Flu Shot  Discontinued

## 2020-02-18 ENCOUNTER — Telehealth: Payer: Self-pay | Admitting: Internal Medicine

## 2020-02-18 NOTE — Telephone Encounter (Signed)
-----   Message from Dia Crawford, LPN sent at [id number]  7:44 AM EDT ----- Regarding: RE: question Thank you. Yes she would like to restart her lipitor. Then stated she feels she needs to be on medication for depression or talk therapy etc.. My apologies those ran together. I did encouraged remind her of labs and appt with you for further discussion. She has recently received insurance coverage and now feels she can cover the cost of talk therapy sessions if that is what is suggested after speaking with you.   Kathleen Everett  ----- Message ----- From: [name], MD Sent: 02/15/2020   4:50 AM EDT To: Kathleen Corona O'Brien-Blaney, LPN Subject: question                                       You saw this pt yesterday.  The note mentioned request to "restart lipitor for depression".  She is currently scheduled for labs on 02/22/20.  I would like to see her lab results and then decide best treatment - which cholesterol medication and dose.  Does she feel needs something for depression?  The note states ok to wait until her 02/24/20 appt.  Just wanted to clarify.  Thank you.    Dr Nicki Reaper

## 2020-02-18 NOTE — Telephone Encounter (Signed)
Spoke to [employer].  Pt does not feel needs acute intervention.  Agreeable to wait until lab results are reviewed and until appt - so we can discuss and plan for further treatment.

## 2020-02-22 ENCOUNTER — Other Ambulatory Visit (INDEPENDENT_AMBULATORY_CARE_PROVIDER_SITE_OTHER): Payer: Medicare HMO

## 2020-02-22 ENCOUNTER — Other Ambulatory Visit: Payer: Self-pay

## 2020-02-22 DIAGNOSIS — E7800 Pure hypercholesterolemia, unspecified: Secondary | ICD-10-CM | POA: Diagnosis not present

## 2020-02-22 DIAGNOSIS — G894 Chronic pain syndrome: Secondary | ICD-10-CM | POA: Diagnosis not present

## 2020-02-22 DIAGNOSIS — M62830 Muscle spasm of back: Secondary | ICD-10-CM | POA: Diagnosis not present

## 2020-02-22 DIAGNOSIS — D649 Anemia, unspecified: Secondary | ICD-10-CM | POA: Diagnosis not present

## 2020-02-22 DIAGNOSIS — M5412 Radiculopathy, cervical region: Secondary | ICD-10-CM | POA: Diagnosis not present

## 2020-02-22 DIAGNOSIS — R739 Hyperglycemia, unspecified: Secondary | ICD-10-CM | POA: Diagnosis not present

## 2020-02-22 DIAGNOSIS — M5442 Lumbago with sciatica, left side: Secondary | ICD-10-CM | POA: Diagnosis not present

## 2020-02-22 LAB — CBC WITH DIFFERENTIAL/PLATELET
Basophils Absolute: 0 10*3/uL (ref 0.0–0.1)
Basophils Relative: 0.4 % (ref 0.0–3.0)
Eosinophils Absolute: 0.2 10*3/uL (ref 0.0–0.7)
Eosinophils Relative: 3.8 % (ref 0.0–5.0)
HCT: 38.9 % (ref 36.0–46.0)
Hemoglobin: 13 g/dL (ref 12.0–15.0)
Lymphocytes Relative: 27.9 % (ref 12.0–46.0)
Lymphs Abs: 1.8 10*3/uL (ref 0.7–4.0)
MCHC: 33.4 g/dL (ref 30.0–36.0)
MCV: 84.5 fl (ref 78.0–100.0)
Monocytes Absolute: 0.5 10*3/uL (ref 0.1–1.0)
Monocytes Relative: 7.1 % (ref 3.0–12.0)
Neutro Abs: 4 10*3/uL (ref 1.4–7.7)
Neutrophils Relative %: 60.8 % (ref 43.0–77.0)
Platelets: 245 10*3/uL (ref 150.0–400.0)
RBC: 4.6 Mil/uL (ref 3.87–5.11)
RDW: 13.8 % (ref 11.5–15.5)
WBC: 6.5 10*3/uL (ref 4.0–10.5)

## 2020-02-22 LAB — LIPID PANEL
Cholesterol: 309 mg/dL — ABNORMAL HIGH (ref 0–200)
HDL: 41.9 mg/dL (ref >39.00)
LDL Cholesterol: 230 mg/dL — ABNORMAL HIGH (ref 0–99)
NonHDL: 266.67
Total CHOL/HDL Ratio: 7
Triglycerides: 185 mg/dL — ABNORMAL HIGH (ref 0.0–149.0)
VLDL: 37 mg/dL (ref 0.0–40.0)

## 2020-02-22 LAB — HEPATIC FUNCTION PANEL
ALT: 18 U/L (ref 0–35)
AST: 17 U/L (ref 0–37)
Albumin: 4.4 g/dL (ref 3.5–5.2)
Alkaline Phosphatase: 105 U/L (ref 39–117)
Bilirubin, Direct: 0 mg/dL (ref 0.0–0.3)
Total Bilirubin: 0.3 mg/dL (ref 0.2–1.2)
Total Protein: 7.1 g/dL (ref 6.0–8.3)

## 2020-02-22 LAB — BASIC METABOLIC PANEL
BUN: 11 mg/dL (ref 6–23)
CO2: 30 mEq/L (ref 19–32)
Calcium: 9.6 mg/dL (ref 8.4–10.5)
Chloride: 104 mEq/L (ref 96–112)
Creatinine, Ser: 0.8 mg/dL (ref 0.40–1.20)
GFR: 77 mL/min (ref >60.00)
Glucose, Bld: 101 mg/dL — ABNORMAL HIGH (ref 70–99)
Potassium: 4 mEq/L (ref 3.5–5.1)
Sodium: 141 mEq/L (ref 135–145)

## 2020-02-22 LAB — IBC + FERRITIN
Ferritin: 50.6 ng/mL (ref 10.0–291.0)
Iron: 51 ug/dL (ref 42–145)
Saturation Ratios: 14.3 % — ABNORMAL LOW (ref 20.0–50.0)
Transferrin: 254 mg/dL (ref 212.0–360.0)

## 2020-02-22 LAB — HEMOGLOBIN A1C: Hgb A1c MFr Bld: 5.3 % (ref 4.6–6.5)

## 2020-02-22 LAB — VITAMIN B12: Vitamin B-12: 333 pg/mL (ref 211–911)

## 2020-02-24 ENCOUNTER — Ambulatory Visit (INDEPENDENT_AMBULATORY_CARE_PROVIDER_SITE_OTHER): Payer: Medicare HMO | Admitting: Internal Medicine

## 2020-02-24 ENCOUNTER — Other Ambulatory Visit: Payer: Self-pay

## 2020-02-24 ENCOUNTER — Encounter: Payer: Self-pay | Admitting: Internal Medicine

## 2020-02-24 VITALS — BP 124/80 | HR 77 | Temp 98.0°F | Resp 16 | Ht 70.0 in | Wt 274.0 lb

## 2020-02-24 DIAGNOSIS — F411 Generalized anxiety disorder: Secondary | ICD-10-CM | POA: Diagnosis not present

## 2020-02-24 DIAGNOSIS — F3340 Major depressive disorder, recurrent, in remission, unspecified: Secondary | ICD-10-CM | POA: Diagnosis not present

## 2020-02-24 DIAGNOSIS — M545 Low back pain, unspecified: Secondary | ICD-10-CM

## 2020-02-24 DIAGNOSIS — R079 Chest pain, unspecified: Secondary | ICD-10-CM | POA: Diagnosis not present

## 2020-02-24 DIAGNOSIS — E7800 Pure hypercholesterolemia, unspecified: Secondary | ICD-10-CM | POA: Diagnosis not present

## 2020-02-24 DIAGNOSIS — K219 Gastro-esophageal reflux disease without esophagitis: Secondary | ICD-10-CM | POA: Diagnosis not present

## 2020-02-24 DIAGNOSIS — Q0700 Arnold-Chiari syndrome without spina bifida or hydrocephalus: Secondary | ICD-10-CM

## 2020-02-24 DIAGNOSIS — G43809 Other migraine, not intractable, without status migrainosus: Secondary | ICD-10-CM | POA: Diagnosis not present

## 2020-02-24 DIAGNOSIS — D649 Anemia, unspecified: Secondary | ICD-10-CM | POA: Diagnosis not present

## 2020-02-24 DIAGNOSIS — G8929 Other chronic pain: Secondary | ICD-10-CM

## 2020-02-24 DIAGNOSIS — Z96641 Presence of right artificial hip joint: Secondary | ICD-10-CM

## 2020-02-24 DIAGNOSIS — R739 Hyperglycemia, unspecified: Secondary | ICD-10-CM

## 2020-02-24 MED ORDER — ESOMEPRAZOLE MAGNESIUM 40 MG PO CPDR: 40.0000 mg | DELAYED_RELEASE_CAPSULE | Freq: Every day | ORAL | 2 refills | Status: DC

## 2020-02-24 MED ORDER — ESTRADIOL 0.1 MG/GM VA CREA: TOPICAL_CREAM | VAGINAL | 1 refills | Status: DC

## 2020-02-24 MED ORDER — ATORVASTATIN CALCIUM 10 MG PO TABS: 10.0000 mg | ORAL_TABLET | Freq: Every day | ORAL | 2 refills | Status: DC

## 2020-02-24 NOTE — Progress Notes (Signed)
Patient ID: Kathleen Everett, female   DOB: [birth year], 49 y.o.   MRN: [id number]   Subjective:    Patient ID: Kathleen Everett, female    DOB: 1971/05/15, 50 y.o.   MRN: [id number]  HPI This visit occurred during the SARS-CoV-2 public health emergency.  Safety protocols were in place, including screening questions prior to the visit, additional usage of staff PPE, and extensive cleaning of exam room while observing appropriate contact time as indicated for disinfecting solutions.  Patient here for a scheduled follow up.  She reports she is doing relatively well.  Has insurance now.  Wants to follow up on a few things she has put off.  Is s/y THR 12/28/19.  Is 75% better.  Able to walk better.  Sees Dr Rudene Christians.  Does experience some intermittent chest pain and increased heart rate. Was questioning if related to anxiety.  Discussed increased stress and anxiety.  Discussed psych referral.  She is in agreement.  Also discussed cardiology referral given risk factors - to confirm if further cardiac w/up warranted.  Does have acid reflux.  Does not feel omeprazole controls.  Discussed taking nexium.  Has had persistent problems with her bladder.  Has seen urology previously.  States has problems emptying her bladder.  Has tried estrogen cream.  Wants to restart.  Eating.  No nausea or vomiting reported.    Past Medical History:  Diagnosis Date  . Back pain   . Chiari malformation    s/p sgy decompression  . Chronic tension headaches   . Complication of anesthesia   . DDD (degenerative disc disease), lumbar   . Depression   . Diverticulitis   . Dysmenorrhea   . Endometriosis   . Gas pain   . GERD (gastroesophageal reflux disease)   . Hidradenitis   . Hyperlipidemia   . Obesity   . Osteoarthritis   . Ovarian cyst    hx  . PONV (postoperative nausea and vomiting)   . Sleep apnea    central sleep apnea  No CPAP right now  . Vitamin B12 deficiency    On injections   Past Surgical History:   Procedure Laterality Date  . ABDOMINAL HYSTERECTOMY  03/27/07   laparoscopic  . BREAST EXCISIONAL BIOPSY Left 2004   fibroadenoma  . CESAREAN SECTION     1999/2004 twins  . chiari decompression    . COLONOSCOPY WITH PROPOFOL N/A 04/28/2015   Procedure: COLONOSCOPY WITH PROPOFOL;  Surgeon: Manya Silvas, MD;  Location: [facility] ENDOSCOPY;  Service: Endoscopy;  Laterality: N/A;  . CYSTOSCOPY     bladder ulcers  . ESOPHAGOGASTRODUODENOSCOPY N/A 04/28/2015   Procedure: ESOPHAGOGASTRODUODENOSCOPY (EGD);  Surgeon: Manya Silvas, MD;  Location[facility];  Service: Endoscopy;  Laterality: N/A;  . HERNIA REPAIR Right 2003   inguinal  . OVARIAN CYST REMOVAL  2001   right  . TEMPOROMANDIBULAR JOINT SURGERY  2000  . TOTAL HIP ARTHROPLASTY Right 12/28/2019   Procedure: TOTAL HIP ARTHROPLASTY ANTERIOR APPROACH;  Surgeon: Hessie Knows, MD;  Location: [facility] ORS;  Service: Orthopedics;  Laterality: Right;  . TUBAL LIGATION     Bilateral   Family History  Problem Relation Age of Onset  . Hyperlipidemia Mother   . Anxiety disorder Mother   . Atrial fibrillation Mother   . Hypertension Mother   . Anxiety disorder Father   . Stroke Father   . Hyperlipidemia Father   . Hypertension Father   . Congestive Heart Failure Father   .  Depression Father   . Hyperlipidemia Brother   . Colon cancer Paternal Aunt   . Dementia Maternal Grandmother   . Dementia Paternal Grandmother   . Breast cancer Neg Hx    Social History   Socioeconomic History  . Marital status: Married    Spouse name: Not on file  . Number of children: 3  . Years of education: 21  . Highest education level: Not on file  Occupational History  . Not on file  Tobacco Use  . Smoking status: Never Smoker  . Smokeless tobacco: Never Used  Substance and Sexual Activity  . Alcohol use: Yes    Alcohol/week: 0.0 standard drinks    Comment: rarely  . Drug use: No  . Sexual activity: Yes    Birth control/protection: None   Other Topics Concern  . Not on file  Social History Narrative   Patient drinks 2-3 cups of caffeine daily.   Patient is right handed.    Social Determinants of Health   Financial Resource Strain: Low Risk   . Difficulty of Paying Living Expenses: Not very hard  Food Insecurity: No Food Insecurity  . Worried About Charity fundraiser in the Last Year: Never true  . Ran Out of Food in the Last Year: Never true  Transportation Needs: No Transportation Needs  . Lack of Transportation (Medical): No  . Lack of Transportation (Non-Medical): No  Physical Activity: Insufficiently Active  . Days of Exercise per Week: 7 days  . Minutes of Exercise per Session: 10 min  Stress:   . Feeling of Stress :   Social Connections: Not Isolated  . Frequency of Communication with Friends and Family: More than three times a week  . Frequency of Social Gatherings with Friends and Family: More than three times a week  . Attends Religious Services: More than 4 times per year  . Active Member of Clubs or Organizations: Not on file  . Attends Archivist Meetings: More than 4 times per year  . Marital Status: Married    Outpatient Encounter Medications as of 02/24/2020  Medication Sig  . acetaminophen (TYLENOL) 500 MG tablet Take 500-1,000 mg by mouth every 6 (six) hours as needed (for pain.).   Marland Kitchen Ascorbic Acid (VITAMIN C) 500 MG CHEW Chew 500 mg by mouth daily.  Marland Kitchen atorvastatin (LIPITOR) 10 MG tablet Take 1 tablet (10 mg total) by mouth daily.  . calcium carbonate (TUMS - DOSED IN MG ELEMENTAL CALCIUM) 500 MG chewable tablet Chew 1-2 tablets by mouth 3 (three) times daily as needed for indigestion or heartburn.  . cyanocobalamin (,VITAMIN B-12,) 1000 MCG/ML injection INJECT ONE ML (CC) INTRAMUSCULARLY ONCE EVERY MONTH (Patient taking differently: Inject 1,000 mcg into the muscle every 30 (thirty) days. )  . diazepam (VALIUM) 5 MG tablet Take 1 tablet (5 mg total) by mouth every 8 (eight) hours as  needed for muscle spasms. (Patient taking differently: Take 2.5-5 mg by mouth daily as needed for muscle spasms. )  . diclofenac sodium (VOLTAREN) 1 % GEL Apply 1 application topically 4 (four) times daily as needed (pain.).   Marland Kitchen esomeprazole (NEXIUM) 40 MG capsule Take 1 capsule (40 mg total) by mouth daily.  Marland Kitchen estradiol (ESTRACE VAGINAL) 0.1 MG/GM vaginal cream One applicator q hs x 5 nights and then 2x/week.  . gabapentin (NEURONTIN) 400 MG capsule Take 400 mg by mouth 3 (three) times daily.  Marland Kitchen lactase (RA DAIRY AID) 3000 units tablet Take 3,000-6,000 Units by mouth  3 (three) times daily as needed (wit dairy consumption).  . methocarbamol (ROBAXIN) 500 MG tablet Take 1 tablet (500 mg total) by mouth every 6 (six) hours as needed for muscle spasms.  Marland Kitchen NARCAN 4 MG/0.1ML LIQD nasal spray kit   . nystatin cream (MYCOSTATIN) APPLY TOPICALLY TWICE DAILY (Patient taking differently: Apply 1 application topically 2 (two) times daily as needed (heat rash). )  . oxyCODONE (ROXICODONE) 5 MG immediate release tablet Take 2-3 tablets (10-15 mg total) by mouth every 6 (six) hours as needed for severe pain.  . Oxycodone HCl 10 MG TABS Take 10 mg by mouth 5 (five) times daily.   . simethicone (GAS RELIEF) 80 MG chewable tablet Chew 80 mg by mouth every 6 (six) hours as needed for flatulence.  . TOPAMAX 25 MG tablet Take 25 mg by mouth at bedtime.  . [DISCONTINUED] omeprazole (PRILOSEC) 20 MG capsule Take 20 mg by mouth daily.   No facility-administered encounter medications on file as of 02/24/2020.   Review of Systems  Constitutional: Negative for appetite change and unexpected weight change.  HENT: Negative for congestion and sinus pressure.   Respiratory: Negative for cough, chest tightness and shortness of breath.   Cardiovascular: Positive for chest pain. Negative for leg swelling.       Increased heart rate as outlined.    Gastrointestinal: Negative for abdominal pain, diarrhea, nausea and vomiting.   Genitourinary: Negative for dysuria.       Feels has trouble emptying bladder as outlined.    Musculoskeletal: Positive for back pain. Negative for joint swelling.  Skin: Negative for color change and rash.  Neurological: Positive for headaches. Negative for dizziness.  Psychiatric/Behavioral: Negative for agitation.       Increased stress and anxiety as outlined.         Objective:    Physical Exam Vitals reviewed.  Constitutional:      General: She is not in acute distress.    Appearance: Normal appearance.  HENT:     Head: Normocephalic and atraumatic.     Right Ear: External ear normal.     Left Ear: External ear normal.  Eyes:     General: No scleral icterus.       Right eye: No discharge.        Left eye: No discharge.     Conjunctiva/sclera: Conjunctivae normal.  Neck:     Thyroid: No thyromegaly.  Cardiovascular:     Rate and Rhythm: Normal rate and regular rhythm.  Pulmonary:     Effort: No respiratory distress.     Breath sounds: Normal breath sounds. No wheezing.  Abdominal:     General: Bowel sounds are normal.     Palpations: Abdomen is soft.     Tenderness: There is no abdominal tenderness.  Musculoskeletal:        General: No swelling or tenderness.     Cervical back: Neck supple. No tenderness.  Lymphadenopathy:     Cervical: No cervical adenopathy.  Skin:    Findings: No erythema or rash.  Neurological:     Mental Status: She is alert.  Psychiatric:        Mood and Affect: Mood normal.        Behavior: Behavior normal.     BP 124/80   Pulse 77   Temp 98 F (36.7 C)   Resp 16   Ht 5' 10"  (1.778 m)   Wt 274 lb (124.3 kg)   LMP 12/08/2006   SpO2  98%   BMI 39.31 kg/m  Wt Readings from Last 3 Encounters:  02/24/20 274 lb (124.3 kg)  02/14/20 268 lb (121.6 kg)  12/28/19 268 lb 1.3 oz (121.6 kg)     Lab Results  Component Value Date   WBC 6.5 02/22/2020   HGB 13.0 02/22/2020   HCT 38.9 02/22/2020   PLT 245.0 02/22/2020   GLUCOSE  101 (H) 02/22/2020   CHOL 309 (H) 02/22/2020   TRIG 185.0 (H) 02/22/2020   HDL 41.90 02/22/2020   LDLDIRECT 218.0 06/18/2018   LDLCALC 230 (H) 02/22/2020   ALT 18 02/22/2020   AST 17 02/22/2020   NA 141 02/22/2020   K 4.0 02/22/2020   CL 104 02/22/2020   CREATININE 0.80 02/22/2020   BUN 11 02/22/2020   CO2 30 02/22/2020   TSH 3.51 06/24/2019   INR 1.1 12/03/2019   HGBA1C 5.3 02/22/2020    DG Chest 2 View  Result Date: 12/27/2019 CLINICAL DATA:  Cough and shortness of breath. EXAM: CHEST - 2 VIEW COMPARISON:  Chest x-ray 07/26/2010 FINDINGS: The cardiac silhouette, mediastinal and hilar contours are within normal limits. The lungs are clear. No infiltrates or effusions. No pulmonary lesions. The bony thorax is intact. IMPRESSION: No acute cardiopulmonary findings. Electronically Signed   By: Marijo Sanes M.D.   On: 12/27/2019 12:32   DG HIP OPERATIVE UNILAT W OR W/O PELVIS RIGHT  Result Date: 12/28/2019 CLINICAL DATA:  Anterior right hip replacement EXAM: OPERATIVE RIGHT HIP (WITH PELVIS IF PERFORMED) 2 VIEWS TECHNIQUE: Fluoroscopic spot image(s) were submitted for interpretation post-operatively. FLUOROSCOPY TIME:  30 seconds COMPARISON:  Right hip radiographs dated 04/21/2017 FINDINGS: Intraoperative radiographs demonstrate a right hip arthroplasty in satisfactory position. No evidence of complication. IMPRESSION: Intraoperative radiographs during right hip arthroplasty, as above. Electronically Signed   By: Julian Hy M.D.   On: 12/28/2019 10:03   DG HIP UNILAT W OR W/O PELVIS 2-3 VIEWS RIGHT  Result Date: 12/28/2019 CLINICAL DATA:  Total hip arthroplasty.  Anterior Pred EXAM: DG HIP (WITH OR WITHOUT PELVIS) 2-3V RIGHT COMPARISON:  Total hip arthroplasty. Prosthetic components appear well seated. Surgical drain in place FINDINGS: Total hip arthroplasty. Components appear well seated. Surgical drain in place. Skin staples noted. IMPRESSION: No complication following total hip  arthroplasty. Electronically Signed   By: Suzy Bouchard M.D.   On: 12/28/2019 10:44       Assessment & Plan:   Problem List Items Addressed This Visit    Anemia    Hgb just checked - 13.  Iron stores ok.  Follow.        Anxiety, generalized    Has previously seen psychiatry.  Having some issues with what she feels is related to stress and anxiety.  Request referral to psychiatry.  Follow.        Relevant Orders   Ambulatory referral to Psychiatry   Arnold-Chiari malformation [facility])    Is s/p surgery.  Has seen neurology.  Have previously discussed referral back.  Has wanted to postpone previously until she got her insurance straightened out. Request referral back to neurology.  Wants to be seen locally.        Relevant Orders   Ambulatory referral to Neurology   Back pain    Followed by pain clinic.        Chest pain - Primary    Has seen Dr Rockey Situ previously.  Cardiac cath 2005 - normal coronary arteries.  With intermittent increased heart rate and palpitations as outlined.  EKG - SR with flattening T waves in III and inversion in V3.  With risk factors.  Discussed anxiety.  Referring back to psych.  Treat acid reflux.  Refer back to Dr Rockey Situ with question of need for further cardiac w/up.        Relevant Orders   EKG 12-Lead   Ambulatory referral to Cardiology   Depression, major, recurrent ([place])    Has previously been followed by psychiatry.  Request referral back.        Generalized anxiety disorder    Refer back to psych as outlined.        GERD (gastroesophageal reflux disease)    Persistent symptoms despite omeprazole.  nexium as directed.  Follow.        Relevant Medications   esomeprazole (NEXIUM) 40 MG capsule   Headache, migraine    Has seen neurology.  Topamax.  Request referral locally.        Relevant Medications   atorvastatin (LIPITOR) 10 MG tablet   Other Relevant Orders   Ambulatory referral to Neurology   Hypercholesteremia    Off  lipitor.  Elevated cholesterol.  Labs discussed.  Restart lipitor.  Follow lipid panel and liver function tests.        Relevant Medications   atorvastatin (LIPITOR) 10 MG tablet   Other Relevant Orders   Hepatic function panel   Hyperglycemia    Low carb diet and exercise.  Follow met b and a1c.       Status post total hip replacement, right    Doing better.  Walking better.  Follow.            [name], MD

## 2020-03-04 ENCOUNTER — Encounter: Payer: Self-pay | Admitting: Internal Medicine

## 2020-03-04 NOTE — Assessment & Plan Note (Signed)
Is s/p surgery.  Has seen neurology.  Have previously discussed referral back.  Has wanted to postpone previously until she got her insurance straightened out. Request referral back to neurology.  Wants to be seen locally.

## 2020-03-04 NOTE — Assessment & Plan Note (Signed)
Has seen Dr Rockey Situ previously.  Cardiac cath 2005 - normal coronary arteries.  With intermittent increased heart rate and palpitations as outlined.  EKG - SR with flattening T waves in III and inversion in V3.  With risk factors.  Discussed anxiety.  Referring back to psych.  Treat acid reflux.  Refer back to Dr Rockey Situ with question of need for further cardiac w/up.

## 2020-03-04 NOTE — Assessment & Plan Note (Signed)
Doing better.  Walking better.  Follow.

## 2020-03-04 NOTE — Assessment & Plan Note (Signed)
Hgb just checked - 13.  Iron stores ok.  Follow.

## 2020-03-04 NOTE — Assessment & Plan Note (Signed)
Has seen neurology.  Topamax.  Request referral locally.

## 2020-03-04 NOTE — Assessment & Plan Note (Signed)
Persistent symptoms despite omeprazole.  nexium as directed.  Follow.

## 2020-03-04 NOTE — Assessment & Plan Note (Signed)
Off lipitor.  Elevated cholesterol.  Labs discussed.  Restart lipitor.  Follow lipid panel and liver function tests.

## 2020-03-04 NOTE — Assessment & Plan Note (Signed)
Followed by pain clinic.  

## 2020-03-04 NOTE — Assessment & Plan Note (Signed)
Has previously seen psychiatry.  Having some issues with what she feels is related to stress and anxiety.  Request referral to psychiatry.  Follow.

## 2020-03-04 NOTE — Assessment & Plan Note (Signed)
Low carb diet and exercise.  Follow met b and a1c.  

## 2020-03-04 NOTE — Assessment & Plan Note (Signed)
Refer back to psych as outlined.

## 2020-03-04 NOTE — Assessment & Plan Note (Signed)
Has previously been followed by psychiatry.  Request referral back.

## 2020-03-08 ENCOUNTER — Ambulatory Visit
Admission: RE | Admit: 2020-03-08 | Discharge: 2020-03-08 | Disposition: A | Discharge status: Home / Self Care | Payer: Medicare HMO | Source: Ambulatory Visit | Attending: Internal Medicine | Admitting: Internal Medicine

## 2020-03-08 DIAGNOSIS — Z1231 Encounter for screening mammogram for malignant neoplasm of breast: Secondary | ICD-10-CM | POA: Diagnosis not present

## 2020-03-11 ENCOUNTER — Telehealth (HOSPITAL_COMMUNITY): Payer: Medicare HMO | Admitting: Psychiatry

## 2020-03-11 NOTE — Progress Notes (Signed)
Cardiology Office Note  Date:  03/13/2020   ID:  Kathleen, Everett [birth year], MRN [id number]  PCP:  [name], MD   Chief Complaint  Patient presents with  . New Patient (Initial Visit)    Intermittent non-radiating chest pain/occassional SOB; Meds verbally reviewed with patient.    HPI:  Kathleen Everett is a 49 year old woman with history of  Obesity, chronic back pain ,  hyperlipidemia, total chol 300 obstructive sleep apnea on CPAP,   chronic chest pain dating back to at least 2005 who presents by referral from [name] for consultation of her   chest pain.  Last seen in 2017 History of chronic chest pain Lost insurance, lost to cardiology follow-up  Recently seen by primary care, Reported having some intermittent chest pain and increased heart rate. Was questioning if related to anxiety  Total hip replacement on right Lots of GERD, problems with dairy  Periods of fast heart rate on watch May not be having symptoms from the fast heart rate   she denies any smoking history, no diabetes  Previous cardiac work-up reviewed Prior cardiac catheterization 2005 showing no significant coronary disease, normal ejection fraction  Labs reviewed HBA1c 5.3  2016 echocardiogram and stress test last year with Dr. Lavera Guise.  CT scan September 2017 No coronary calcifications, no PAD/aortic atherosclerosis  On prior office visit reported having occasional chest pain  occasional swelling in her feet and ankles Increasing shortness of breath on exertion Activity limited by her chronic back pain.  EKG on today's visit shows normal sinus rhythm with rate 79 bpm, no significant ST or T-wave changes  Other past medical history reviewed   She had a cardiac catheterization in 2005 showing no significant coronary artery disease , normal ejection fraction  Stress test was done prior to that with nonspecific ST changes noted, mild chest pain with walking. She achieved  10 METS   previous  echocardiogram and stress test with Dr. Lavera Guise.  These were reportedly normal.   Father died at age 69, had bypass surgery at 67 also had carotid disease. He is not a smoker   PMH:   has a past medical history of Back pain, Chiari malformation, Chronic tension headaches, Complication of anesthesia, DDD (degenerative disc disease), lumbar, Depression, Diverticulitis, Dysmenorrhea, Endometriosis, Gas pain, GERD (gastroesophageal reflux disease), Hidradenitis, Hyperlipidemia, Obesity, Osteoarthritis, Ovarian cyst, PONV (postoperative nausea and vomiting), Sleep apnea, and Vitamin B12 deficiency.  PSH:    Past Surgical History:  Procedure Laterality Date  . ABDOMINAL HYSTERECTOMY  03/27/07   laparoscopic  . BREAST EXCISIONAL BIOPSY Left 2004   fibroadenoma  . CESAREAN SECTION     1999/2004 twins  . chiari decompression    . COLONOSCOPY WITH PROPOFOL N/A 04/28/2015   Procedure: COLONOSCOPY WITH PROPOFOL;  Surgeon: Manya Silvas, MD;  Location: [facility] ENDOSCOPY;  Service: Endoscopy;  Laterality: N/A;  . CYSTOSCOPY     bladder ulcers  . ESOPHAGOGASTRODUODENOSCOPY N/A 04/28/2015   Procedure: ESOPHAGOGASTRODUODENOSCOPY (EGD);  Surgeon: Manya Silvas, MD;  Location: [facility] ENDOSCOPY;  Service: Endoscopy;  Laterality: N/A;  . HERNIA REPAIR Right 2003   inguinal  . OVARIAN CYST REMOVAL  2001   right  . TEMPOROMANDIBULAR JOINT SURGERY  2000  . TOTAL HIP ARTHROPLASTY Right 12/28/2019   Procedure: TOTAL HIP ARTHROPLASTY ANTERIOR APPROACH;  Surgeon: Hessie Knows, MD;  Location: [facility] ORS;  Service: Orthopedics;  Laterality: Right;  . TUBAL LIGATION     Bilateral    Current Outpatient Medications  Medication  Sig Dispense Refill  . acetaminophen (TYLENOL) 500 MG tablet Take 500-1,000 mg by mouth every 6 (six) hours as needed (for pain.).     Marland Kitchen atorvastatin (LIPITOR) 10 MG tablet Take 1 tablet (10 mg total) by mouth daily. 30 tablet 2  . cyanocobalamin (,VITAMIN B-12,) 1000  MCG/ML injection INJECT ONE ML (CC) INTRAMUSCULARLY ONCE EVERY MONTH (Patient taking differently: Inject 1,000 mcg into the muscle every 30 (thirty) days. ) 10 mL 1  . diazepam (VALIUM) 5 MG tablet Take 1 tablet (5 mg total) by mouth every 8 (eight) hours as needed for muscle spasms. (Patient taking differently: Take 2.5-5 mg by mouth daily as needed for muscle spasms. ) 20 tablet 0  . diclofenac sodium (VOLTAREN) 1 % GEL Apply 1 application topically 4 (four) times daily as needed (pain.).     Marland Kitchen esomeprazole (NEXIUM) 40 MG capsule Take 1 capsule (40 mg total) by mouth daily. 30 capsule 2  . estradiol (ESTRACE VAGINAL) 0.1 MG/GM vaginal cream One applicator q hs x 5 nights and then 2x/week. 42.5 g 1  . gabapentin (NEURONTIN) 400 MG capsule Take 400 mg by mouth 3 (three) times daily.    Marland Kitchen ibuprofen (ADVIL) 800 MG tablet Take 800 mg by mouth every 8 (eight) hours as needed.    . lactase (RA DAIRY AID) 3000 units tablet Take 3,000-6,000 Units by mouth 3 (three) times daily as needed (with dairy consumption).     . methocarbamol (ROBAXIN) 500 MG tablet Take 1 tablet (500 mg total) by mouth every 6 (six) hours as needed for muscle spasms. 30 tablet 0  . methocarbamol (ROBAXIN) 500 MG tablet Take 500 mg by mouth 3 (three) times daily as needed.    Marland Kitchen NARCAN 4 MG/0.1ML LIQD nasal spray kit     . NON FORMULARY Thrive vitamins-2 capsules every morning    . nystatin cream (MYCOSTATIN) APPLY TOPICALLY TWICE DAILY (Patient taking differently: Apply 1 application topically 2 (two) times daily as needed (heat rash). ) 30 g 0  . Oxycodone HCl 10 MG TABS Take 10 mg by mouth 5 (five) times daily. 5 times daily PRN  0  . simethicone (GAS RELIEF) 80 MG chewable tablet Chew 80 mg by mouth every 6 (six) hours as needed for flatulence.    . TOPAMAX 25 MG tablet Take 25 mg by mouth at bedtime.    . Ascorbic Acid (VITAMIN C) 500 MG CHEW Chew 500 mg by mouth daily.    . calcium carbonate (TUMS - DOSED IN MG ELEMENTAL  CALCIUM) 500 MG chewable tablet Chew 1-2 tablets by mouth 3 (three) times daily as needed for indigestion or heartburn.    Marland Kitchen oxyCODONE (ROXICODONE) 5 MG immediate release tablet Take 2-3 tablets (10-15 mg total) by mouth every 6 (six) hours as needed for severe pain. 30 tablet 0   No current facility-administered medications for this visit.     Allergies:   Vancomycin, Ceftin [cefuroxime axetil], Celexa [citalopram hydrobromide], Clindamycin/lincomycin, Penicillins, and Sulfa antibiotics   Social History:  The patient  reports that she has never smoked. She has never used smokeless tobacco. She reports current alcohol use. She reports that she does not use drugs.   Family History:   family history includes Anxiety disorder in her father and mother; Atrial fibrillation in her mother; Colon cancer in her paternal aunt; Congestive Heart Failure in her father; Dementia in her maternal grandmother and paternal grandmother; Depression in her father; Hyperlipidemia in her brother, father, and mother; Hypertension  in her father and mother; Stroke in her father.    Review of Systems: Review of Systems  Constitutional: Negative.   Respiratory: Negative.   Cardiovascular: Positive for chest pain and leg swelling.  Gastrointestinal: Negative.   Musculoskeletal: Positive for back pain.  Neurological: Negative.   Psychiatric/Behavioral: Negative.   All other systems reviewed and are negative.   PHYSICAL EXAM: VS:  BP 130/80 (BP Location: Right Arm, Patient Position: Sitting, Cuff Size: Large)   Pulse 79   Ht 5' 10"  (1.778 m)   Wt 275 lb (124.7 kg)   LMP 12/08/2006   SpO2 98%   BMI 39.46 kg/m  , BMI Body mass index is 39.46 kg/m. GEN: Well nourished, well developed, in no acute distress, obese  HEENT: normal  Neck: no JVD, carotid bruits, or masses Cardiac: RRR; no murmurs, rubs, or gallops,no edema  Respiratory:  clear to auscultation bilaterally, normal work of breathing GI: soft,  nontender, nondistended, + BS MS: no deformity or atrophy  Skin: warm and dry, no rash Neuro:  Strength and sensation are intact Psych: euthymic mood, full affect   Recent Labs: 06/24/2019: TSH 3.51 02/22/2020: ALT 18; BUN 11; Creatinine, Ser 0.80; Hemoglobin 13.0; Platelets 245.0; Potassium 4.0; Sodium 141    Lipid Panel Lab Results  Component Value Date   CHOL 309 (H) 02/22/2020   HDL 41.90 02/22/2020   LDLCALC 230 (H) 02/22/2020   TRIG 185.0 (H) 02/22/2020      Wt Readings from Last 3 Encounters:  03/13/20 275 lb (124.7 kg)  02/24/20 274 lb (124.3 kg)  02/14/20 268 lb (121.6 kg)      ASSESSMENT AND PLAN:  Hypercholesterolemia Only recently restarted taking a statin So far no side effects  Obstructive sleep apnea syndrome Recommend CPAP  Morbid obesity (HCC) Limited in her ability to exercise secondary to chronic back pain, recent hip surgery Recommend low carbohydrate diet  Chest pain, unspecified type Prior cardiac catheterization for chest pain with no coronary disease many years ago,  CT chest 2017 showing no coronary calcifications, no PAD No further work-up at this time  Chronic low back pain, unspecified back pain laterality, with sciatica presence unspecified Limiting ability to exercise, Recommended low impact activities  Lower extremity edema Minimal edema noted on today's visit Likely dependent edema, recommend compression hose leg elevation She prefers to have Lasix with potassium handy if symptoms get worse   Total encounter time more than 60 minutes  Greater than 50% was spent in counseling and coordination of care with the patient   Disposition:   F/U as needed  Patient seen in consultation for Dr. Janae Bridgeman and will be referred back to her office for ongoing care of the issues detailed above   No orders of the defined types were placed in this encounter.    Signed, Esmond Plants, M.D., Ph.D. 03/13/2020  [place]

## 2020-03-13 ENCOUNTER — Ambulatory Visit: Payer: Medicare HMO | Admitting: Cardiovascular Disease

## 2020-03-13 ENCOUNTER — Other Ambulatory Visit: Payer: Self-pay

## 2020-03-13 ENCOUNTER — Encounter: Payer: Self-pay | Admitting: Cardiovascular Disease

## 2020-03-13 VITALS — BP 130/80 | HR 79 | Ht 70.0 in | Wt 275.0 lb

## 2020-03-13 DIAGNOSIS — E7800 Pure hypercholesterolemia, unspecified: Secondary | ICD-10-CM

## 2020-03-13 DIAGNOSIS — R079 Chest pain, unspecified: Secondary | ICD-10-CM | POA: Diagnosis not present

## 2020-03-13 MED ORDER — FUROSEMIDE 20 MG PO TABS: 20.0000 mg | ORAL_TABLET | Freq: Every day | ORAL | 3 refills | Status: DC | PRN

## 2020-03-13 MED ORDER — POTASSIUM CHLORIDE ER 10 MEQ PO TBCR: 10.0000 meq | EXTENDED_RELEASE_TABLET | Freq: Every day | ORAL | 3 refills | Status: DC | PRN

## 2020-03-13 NOTE — Patient Instructions (Addendum)
Read about Zetia, drops cholesterol 20%   Medication Instructions:  Your physician has recommended you make the following change in your medication:  1. AS NEEDED Furosemide (Lasix) 20 mg daily for leg swelling 2. AS NEEDED Potassium 10 mEq daily as needed with Furosemide (Lasix)  If you need a refill on your cardiac medications before your next appointment, please call your pharmacy.    Lab work: No new labs needed   If you have labs (blood work) drawn today and your tests are completely normal, you will receive your results only by: Marland Kitchen MyChart Message (if you have MyChart) OR . A paper copy in the mail If you have any lab test that is abnormal or we need to change your treatment, we will call you to review the results.   Testing/Procedures: No new testing needed   Follow-Up: At [facility], you and your health needs are our priority.  As part of our continuing mission to provide you with exceptional heart care, we have created designated Provider Care Teams.  These Care Teams include your primary Cardiologist (physician) and Advanced Practice Providers (APPs -  Physician Assistants and Nurse Practitioners) who all work together to provide you with the care you need, when you need it.  . You will need a follow up appointment as needed  . Providers on your designated Care Team:   . Murray Hodgkins, NP . Christell Faith, PA-C . Marrianne Mood, PA-C   Any Other Special Instructions Will Be Listed Below (If Applicable).  COVID-19 Vaccine Information can be found at: ShippingScam.co.uk For questions related to vaccine distribution or appointments, please email vaccine@ .com or call [phone].

## 2020-03-20 DIAGNOSIS — M5117 Intervertebral disc disorders with radiculopathy, lumbosacral region: Secondary | ICD-10-CM | POA: Diagnosis not present

## 2020-03-20 DIAGNOSIS — M4316 Spondylolisthesis, lumbar region: Secondary | ICD-10-CM | POA: Diagnosis not present

## 2020-03-20 DIAGNOSIS — M5116 Intervertebral disc disorders with radiculopathy, lumbar region: Secondary | ICD-10-CM | POA: Diagnosis not present

## 2020-03-20 DIAGNOSIS — M4725 Other spondylosis with radiculopathy, thoracolumbar region: Secondary | ICD-10-CM | POA: Diagnosis not present

## 2020-03-20 DIAGNOSIS — M4726 Other spondylosis with radiculopathy, lumbar region: Secondary | ICD-10-CM | POA: Diagnosis not present

## 2020-03-24 DIAGNOSIS — G4733 Obstructive sleep apnea (adult) (pediatric): Secondary | ICD-10-CM | POA: Diagnosis not present

## 2020-03-29 ENCOUNTER — Telehealth: Payer: Self-pay | Admitting: Internal Medicine

## 2020-03-29 NOTE — Telephone Encounter (Signed)
Pt states she would like to get a referral to the urologist. Please advise and Thank you!

## 2020-03-30 NOTE — Telephone Encounter (Signed)
LMTCB

## 2020-04-03 NOTE — Telephone Encounter (Signed)
Pt returned your call. Pt states that Dr Nicki Reaper is aware of needed referral. Pt states that she was going to get her referred to the doctor in [place] that did her bladder surgery.

## 2020-04-04 NOTE — Telephone Encounter (Signed)
She was previously seen at [facility] (Dr Vikki Ports) is physician listed.  I can place referral. Any new symptoms?  Please confirm current symptoms.

## 2020-04-04 NOTE — Telephone Encounter (Signed)
LMTCB

## 2020-04-06 ENCOUNTER — Other Ambulatory Visit: Payer: Self-pay

## 2020-04-06 ENCOUNTER — Other Ambulatory Visit (INDEPENDENT_AMBULATORY_CARE_PROVIDER_SITE_OTHER): Payer: Medicare HMO

## 2020-04-06 DIAGNOSIS — E7800 Pure hypercholesterolemia, unspecified: Secondary | ICD-10-CM | POA: Diagnosis not present

## 2020-04-06 LAB — HEPATIC FUNCTION PANEL
ALT: 23 U/L (ref 0–35)
AST: 19 U/L (ref 0–37)
Albumin: 4.5 g/dL (ref 3.5–5.2)
Alkaline Phosphatase: 118 U/L — ABNORMAL HIGH (ref 39–117)
Bilirubin, Direct: 0.1 mg/dL (ref 0.0–0.3)
Total Bilirubin: 0.4 mg/dL (ref 0.2–1.2)
Total Protein: 7.2 g/dL (ref 6.0–8.3)

## 2020-04-13 DIAGNOSIS — Z9989 Dependence on other enabling machines and devices: Secondary | ICD-10-CM | POA: Diagnosis not present

## 2020-04-13 DIAGNOSIS — G935 Compression of brain: Secondary | ICD-10-CM | POA: Diagnosis not present

## 2020-04-13 DIAGNOSIS — G43019 Migraine without aura, intractable, without status migrainosus: Secondary | ICD-10-CM | POA: Diagnosis not present

## 2020-04-13 DIAGNOSIS — G4733 Obstructive sleep apnea (adult) (pediatric): Secondary | ICD-10-CM | POA: Diagnosis not present

## 2020-04-14 ENCOUNTER — Other Ambulatory Visit: Payer: Self-pay | Admitting: Internal Medicine

## 2020-04-14 DIAGNOSIS — R748 Abnormal levels of other serum enzymes: Secondary | ICD-10-CM

## 2020-04-14 NOTE — Progress Notes (Signed)
Order placed for f/u liver panel.  

## 2020-04-15 ENCOUNTER — Other Ambulatory Visit: Payer: Self-pay

## 2020-04-15 ENCOUNTER — Telehealth (INDEPENDENT_AMBULATORY_CARE_PROVIDER_SITE_OTHER): Payer: Medicare HMO | Admitting: Psychiatry

## 2020-04-15 ENCOUNTER — Encounter (HOSPITAL_COMMUNITY): Payer: Self-pay | Admitting: Psychiatry

## 2020-04-15 DIAGNOSIS — F411 Generalized anxiety disorder: Secondary | ICD-10-CM | POA: Diagnosis not present

## 2020-04-15 DIAGNOSIS — F331 Major depressive disorder, recurrent, moderate: Secondary | ICD-10-CM | POA: Diagnosis not present

## 2020-04-15 MED ORDER — FLUOXETINE HCL 20 MG PO CAPS: 20.0000 mg | ORAL_CAPSULE | Freq: Every day | ORAL | 1 refills | Status: DC
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
## 2020-04-22 ENCOUNTER — Other Ambulatory Visit: Payer: Self-pay | Admitting: Internal Medicine

## 2020-04-22 DIAGNOSIS — R339 Retention of urine, unspecified: Secondary | ICD-10-CM

## 2020-04-22 NOTE — Progress Notes (Signed)
Order placed for urology referral.  

## 2020-05-01 DIAGNOSIS — M7918 Myalgia, other site: Secondary | ICD-10-CM | POA: Diagnosis not present

## 2020-05-01 DIAGNOSIS — M545 Low back pain: Secondary | ICD-10-CM | POA: Diagnosis not present

## 2020-05-02 DIAGNOSIS — M25551 Pain in right hip: Secondary | ICD-10-CM | POA: Diagnosis not present

## 2020-05-02 DIAGNOSIS — M160 Bilateral primary osteoarthritis of hip: Secondary | ICD-10-CM | POA: Diagnosis not present

## 2020-05-02 DIAGNOSIS — M5412 Radiculopathy, cervical region: Secondary | ICD-10-CM | POA: Diagnosis not present

## 2020-05-02 DIAGNOSIS — G894 Chronic pain syndrome: Secondary | ICD-10-CM | POA: Diagnosis not present

## 2020-05-04 NOTE — Telephone Encounter (Signed)
LMTCB . Pt has appt Monday. Will f/u with her then

## 2020-05-05 DIAGNOSIS — M25551 Pain in right hip: Secondary | ICD-10-CM | POA: Diagnosis not present

## 2020-05-05 DIAGNOSIS — Z96641 Presence of right artificial hip joint: Secondary | ICD-10-CM | POA: Diagnosis not present

## 2020-05-05 DIAGNOSIS — M1611 Unilateral primary osteoarthritis, right hip: Secondary | ICD-10-CM | POA: Diagnosis not present

## 2020-05-08 ENCOUNTER — Other Ambulatory Visit: Payer: Self-pay

## 2020-05-08 ENCOUNTER — Other Ambulatory Visit
Admission: RE | Admit: 2020-05-08 | Discharge: 2020-05-08 | Disposition: A | Discharge status: Home / Self Care | Payer: Medicare HMO | Source: Ambulatory Visit | Attending: Sports Medicine | Admitting: Sports Medicine

## 2020-05-08 ENCOUNTER — Ambulatory Visit (INDEPENDENT_AMBULATORY_CARE_PROVIDER_SITE_OTHER): Payer: Medicare HMO

## 2020-05-08 ENCOUNTER — Encounter: Payer: Self-pay | Admitting: Internal Medicine

## 2020-05-08 ENCOUNTER — Ambulatory Visit (INDEPENDENT_AMBULATORY_CARE_PROVIDER_SITE_OTHER): Payer: Medicare HMO | Admitting: Internal Medicine

## 2020-05-08 DIAGNOSIS — M25451 Effusion, right hip: Secondary | ICD-10-CM | POA: Insufficient documentation

## 2020-05-08 DIAGNOSIS — K219 Gastro-esophageal reflux disease without esophagitis: Secondary | ICD-10-CM | POA: Diagnosis not present

## 2020-05-08 DIAGNOSIS — R0602 Shortness of breath: Secondary | ICD-10-CM | POA: Diagnosis not present

## 2020-05-08 DIAGNOSIS — R0609 Other forms of dyspnea: Secondary | ICD-10-CM | POA: Insufficient documentation

## 2020-05-08 DIAGNOSIS — F439 Reaction to severe stress, unspecified: Secondary | ICD-10-CM

## 2020-05-08 DIAGNOSIS — M545 Low back pain, unspecified: Secondary | ICD-10-CM

## 2020-05-08 DIAGNOSIS — F32A Depression, unspecified: Secondary | ICD-10-CM

## 2020-05-08 DIAGNOSIS — E7800 Pure hypercholesterolemia, unspecified: Secondary | ICD-10-CM

## 2020-05-08 DIAGNOSIS — M25551 Pain in right hip: Secondary | ICD-10-CM | POA: Diagnosis not present

## 2020-05-08 DIAGNOSIS — R739 Hyperglycemia, unspecified: Secondary | ICD-10-CM

## 2020-05-08 DIAGNOSIS — M1611 Unilateral primary osteoarthritis, right hip: Secondary | ICD-10-CM | POA: Diagnosis not present

## 2020-05-08 DIAGNOSIS — J90 Pleural effusion, not elsewhere classified: Secondary | ICD-10-CM | POA: Diagnosis not present

## 2020-05-08 DIAGNOSIS — G8929 Other chronic pain: Secondary | ICD-10-CM

## 2020-05-08 DIAGNOSIS — F320 Major depressive disorder, single episode, mild: Secondary | ICD-10-CM

## 2020-05-08 DIAGNOSIS — Z96641 Presence of right artificial hip joint: Secondary | ICD-10-CM

## 2020-05-08 DIAGNOSIS — D649 Anemia, unspecified: Secondary | ICD-10-CM | POA: Diagnosis not present

## 2020-05-08 LAB — SYNOVIAL CELL COUNT + DIFF, W/ CRYSTALS
Crystals, Fluid: NONE SEEN
Eosinophils-Synovial: 0 %
Lymphocytes-Synovial Fld: 73 %
Monocyte-Macrophage-Synovial Fluid: 18 %
Neutrophil, Synovial: 9 %
WBC, Synovial: 1607 /mm3 — ABNORMAL HIGH (ref 0–200)

## 2020-05-08 NOTE — Progress Notes (Signed)
Patient ID: Kathleen Everett, female   DOB: 1971/10/21, 49 y.o.   MRN: [id number]   Subjective:    Patient ID: Kathleen Everett, female    DOB: [birth year], 49 y.o.   MRN: [id number]  HPI This visit occurred during the SARS-CoV-2 public health emergency.  Safety protocols were in place, including screening questions prior to the visit, additional usage of staff PPE, and extensive cleaning of exam room while observing appropriate contact time as indicated for disinfecting solutions.  Patient here for a scheduled follow up.  She is s/p total hip arthroplasty - anterior approach - 12/2019.  Over the last month, she has noticed pain - right hip and a shooting pain down her right leg.  Pain worsened last week. Increased pain to try and stand and put weight on the leg.  Noticed redness.  Planning for aspiration/f/u with ortho today.  No chest pain or sob reported. Does report feeling not able to get a good breath at times - feels full.   Has had some constipation recently.  Minimal bowel movement yesterday and some today.  Has appt with urology next month.  On prozac.  Has f/u with psych two weeks.  Some increased acid reflux.    Past Medical History:  Diagnosis Date  . Anxiety   . Back pain   . Chiari malformation    s/p sgy decompression  . Chronic tension headaches   . Complication of anesthesia   . DDD (degenerative disc disease), lumbar   . Depression   . Diverticulitis   . Dysmenorrhea   . Endometriosis   . Gas pain   . GERD (gastroesophageal reflux disease)   . Hidradenitis   . Hyperlipidemia   . Mitral valve prolapse   . Obesity   . Osteoarthritis   . Ovarian cyst    hx  . PONV (postoperative nausea and vomiting)   . Sleep apnea    central sleep apnea  No CPAP right now  . TMJ (dislocation of temporomandibular joint)   . Vitamin B12 deficiency    On injections   Past Surgical History:  Procedure Laterality Date  . ABDOMINAL HYSTERECTOMY  03/27/07   laparoscopic  .  BREAST EXCISIONAL BIOPSY Left 2004   fibroadenoma  . CESAREAN SECTION     1999/2004 twins  . chiari decompression    . COLONOSCOPY WITH PROPOFOL N/A 04/28/2015   Procedure: COLONOSCOPY WITH PROPOFOL;  Surgeon: Manya Silvas, MD;  Location: [facility] ENDOSCOPY;  Service: Endoscopy;  Laterality: N/A;  . CYSTOSCOPY     bladder ulcers  . ESOPHAGOGASTRODUODENOSCOPY N/A 04/28/2015   Procedure: ESOPHAGOGASTRODUODENOSCOPY (EGD);  Surgeon: Manya Silvas, MD;  Location: [facility] ENDOSCOPY;  Service: Endoscopy;  Laterality: N/A;  . HERNIA REPAIR Right 2003   inguinal  . INCISION AND DRAINAGE HIP Right 05/11/2020   Procedure: IRRIGATION AND DEBRIDEMENT HIP;  Surgeon: Hessie Knows, MD;  Location: [facility] ORS;  Service: Orthopedics;  Laterality: Right;  . OVARIAN CYST REMOVAL  2001   right  . TEMPOROMANDIBULAR JOINT SURGERY  2000  . TOTAL HIP ARTHROPLASTY Right 12/28/2019   Procedure: TOTAL HIP ARTHROPLASTY ANTERIOR APPROACH;  Surgeon: Hessie Knows, MD;  Location: [facility] ORS;  Service: Orthopedics;  Laterality: Right;  . TUBAL LIGATION     Bilateral   Family History  Problem Relation Age of Onset  . Hyperlipidemia Mother   . Anxiety disorder Mother   . Atrial fibrillation Mother   . Hypertension Mother   . Anxiety disorder Father   .  Stroke Father   . Hyperlipidemia Father   . Hypertension Father   . Congestive Heart Failure Father   . Depression Father   . Hyperlipidemia Brother   . Colon cancer Paternal Aunt   . Dementia Paternal Aunt   . Dementia Maternal Grandmother   . Dementia Paternal Grandmother   . ADD / ADHD Daughter   . Breast cancer Neg Hx    Social History   Socioeconomic History  . Marital status: Married    Spouse name: Not on file  . Number of children: 3  . Years of education: 73  . Highest education level: Not on file  Occupational History  . Not on file  Tobacco Use  . Smoking status: Never Smoker  . Smokeless tobacco: Never Used  Vaping Use  . Vaping Use: Never  used  Substance and Sexual Activity  . Alcohol use: Yes    Alcohol/week: 0.0 standard drinks    Comment: rarely  . Drug use: No  . Sexual activity: Yes    Birth control/protection: None  Other Topics Concern  . Not on file  Social History Narrative   Patient drinks 2-3 cups of caffeine daily.   Patient is right handed.       Social Hx:   Current living situation- [place]. Living with husband and 3 kids   Born and Raised- [place].raised by both parents.    Siblings- 1 brother who is 7 yrs older than her   Legal issues- none   Married for 24 yrs. Currently unemployed since 2011 when she hurt her back. HS grad and CNA and phlebotomy.    Social Determinants of Health   Financial Resource Strain: Low Risk   . Difficulty of Paying Living Expenses: Not very hard  Food Insecurity: No Food Insecurity  . Worried About Charity fundraiser in the Last Year: Never true  . Ran Out of Food in the Last Year: Never true  Transportation Needs: No Transportation Needs  . Lack of Transportation (Medical): No  . Lack of Transportation (Non-Medical): No  Physical Activity: Insufficiently Active  . Days of Exercise per Week: 7 days  . Minutes of Exercise per Session: 10 min  Stress:   . Feeling of Stress :   Social Connections: Socially Integrated  . Frequency of Communication with Friends and Family: More than three times a week  . Frequency of Social Gatherings with Friends and Family: More than three times a week  . Attends Religious Services: More than 4 times per year  . Active Member of Clubs or Organizations: Not on file  . Attends Archivist Meetings: More than 4 times per year  . Marital Status: Married    Outpatient Encounter Medications as of 05/08/2020  Medication Sig  . acetaminophen (TYLENOL) 500 MG tablet Take 500-1,000 mg by mouth 2 (two) times daily as needed for mild pain.   . Ascorbic Acid (VITAMIN C) 500 MG CHEW Chew 500 mg by mouth daily.  Marland Kitchen atorvastatin (LIPITOR)  10 MG tablet Take 1 tablet (10 mg total) by mouth daily. (Patient taking differently: Take 10 mg by mouth at bedtime. )  . cyanocobalamin (,VITAMIN B-12,) 1000 MCG/ML injection INJECT ONE ML (CC) INTRAMUSCULARLY ONCE EVERY MONTH (Patient taking differently: Inject 1,000 mcg into the muscle every 30 (thirty) days. )  . diazepam (VALIUM) 5 MG tablet Take 1 tablet (5 mg total) by mouth every 8 (eight) hours as needed for muscle spasms. (Patient taking differently: Take 5 mg  by mouth daily as needed for muscle spasms. )  . diclofenac sodium (VOLTAREN) 1 % GEL Apply 1 application topically 4 (four) times daily as needed (pain.).   Marland Kitchen esomeprazole (NEXIUM) 40 MG capsule Take 1 capsule (40 mg total) by mouth daily.  Marland Kitchen estradiol (ESTRACE VAGINAL) 0.1 MG/GM vaginal cream One applicator q hs x 5 nights and then 2x/week. (Patient taking differently: Place 1 Applicatorful vaginally 2 (two) times a week. )  . FLUoxetine (PROZAC) 20 MG capsule Take 1 capsule (20 mg total) by mouth daily.  . Fremanezumab-vfrm 225 MG/1.5ML SOAJ Inject 225 mg into the skin.  (Patient not taking: Reported on 05/11/2020)  . furosemide (LASIX) 20 MG tablet Take 1 tablet (20 mg total) by mouth daily as needed (As needed for leg swelling).  . gabapentin (NEURONTIN) 400 MG capsule Take 400 mg by mouth 3 (three) times daily.  Marland Kitchen ibuprofen (ADVIL) 800 MG tablet Take 800 mg by mouth 3 (three) times daily as needed (Pain).   Marland Kitchen lactase (RA DAIRY AID) 3000 units tablet Take 9,000 Units by mouth 3 (three) times daily as needed (with dairy consumption).   . methocarbamol (ROBAXIN) 500 MG tablet Take 1 tablet (500 mg total) by mouth every 6 (six) hours as needed for muscle spasms. (Patient taking differently: Take 500 mg by mouth 3 (three) times daily. )  . NARCAN 4 MG/0.1ML LIQD nasal spray kit Place 0.4 mg into the nose once.   . NON FORMULARY Take 2 capsules by mouth daily. Thrive vitamins with shake and derma patch  . nystatin cream (MYCOSTATIN)  APPLY TOPICALLY TWICE DAILY (Patient taking differently: Apply 1 application topically 2 (two) times daily as needed (heat rash). )  . Oxycodone HCl 10 MG TABS Take 10 mg by mouth 5 (five) times daily.   . potassium chloride (KLOR-CON) 10 MEQ tablet Take 1 tablet (10 mEq total) by mouth daily as needed (As needed with Furosemide (Lasix)).  . rizatriptan (MAXALT) 10 MG tablet Take 10 mg by mouth as needed for migraine. May repeat in 2 hours if needed  . Simethicone (GAS RELIEF PO) Take 125 mg by mouth 2 (two) times daily as needed for flatulence. Anti gas  . TOPAMAX 25 MG tablet Take 25 mg by mouth daily as needed (Pain and headache).   . [DISCONTINUED] calcium carbonate (TUMS - DOSED IN MG ELEMENTAL CALCIUM) 500 MG chewable tablet Chew 1-2 tablets by mouth 3 (three) times daily as needed for indigestion or heartburn.  . [DISCONTINUED] methocarbamol (ROBAXIN) 500 MG tablet Take 500 mg by mouth 3 (three) times daily.   . [DISCONTINUED] oxyCODONE (ROXICODONE) 5 MG immediate release tablet Take 2-3 tablets (10-15 mg total) by mouth every 6 (six) hours as needed for severe pain. (Patient not taking: Reported on 04/15/2020)   No facility-administered encounter medications on file as of 05/08/2020.    Review of Systems  Constitutional: Negative for appetite change and unexpected weight change.  HENT: Negative for congestion and sinus pressure.   Respiratory: Negative for cough and chest tightness.        Occasional feeling of not being able to get a good breath.   Cardiovascular: Negative for chest pain, palpitations and leg swelling.  Gastrointestinal: Negative for abdominal pain, diarrhea, nausea and vomiting.  Genitourinary: Negative for difficulty urinating and dysuria.  Musculoskeletal: Negative for myalgias.       Right hip pain as otlined.    Skin: Negative for color change and rash.  Neurological: Negative for dizziness, light-headedness  and headaches.  Psychiatric/Behavioral: Negative for  agitation and dysphoric mood.       Objective:    Physical Exam Vitals reviewed.  Constitutional:      General: She is not in acute distress.    Appearance: Normal appearance.  HENT:     Head: Normocephalic and atraumatic.     Right Ear: External ear normal.     Left Ear: External ear normal.  Eyes:     General: No scleral icterus.       Right eye: No discharge.        Left eye: No discharge.     Conjunctiva/sclera: Conjunctivae normal.  Neck:     Thyroid: No thyromegaly.  Cardiovascular:     Rate and Rhythm: Normal rate and regular rhythm.  Pulmonary:     Effort: No respiratory distress.     Breath sounds: Normal breath sounds. No wheezing.  Abdominal:     General: Bowel sounds are normal.     Palpations: Abdomen is soft.     Tenderness: There is no abdominal tenderness.  Musculoskeletal:        General: No swelling or tenderness.     Cervical back: Neck supple. No tenderness.  Lymphadenopathy:     Cervical: No cervical adenopathy.  Skin:    Findings: No erythema or rash.  Neurological:     Mental Status: She is alert.  Psychiatric:        Mood and Affect: Mood normal.        Behavior: Behavior normal.     BP 134/78   Pulse 82   Temp 97.8 F (36.6 C)   Resp 16   Ht 5' 10"  (1.778 m)   Wt 273 lb (123.8 kg)   LMP 12/08/2006   SpO2 98%   BMI 39.17 kg/m  Wt Readings from Last 3 Encounters:  05/11/20 269 lb (122 kg)  05/08/20 273 lb (123.8 kg)  03/13/20 275 lb (124.7 kg)     Lab Results  Component Value Date   WBC 8.7 05/11/2020   HGB 11.9 (L) 05/11/2020   HCT 36.4 05/11/2020   PLT 276 05/11/2020   GLUCOSE 101 (H) 02/22/2020   CHOL 309 (H) 02/22/2020   TRIG 185.0 (H) 02/22/2020   HDL 41.90 02/22/2020   LDLDIRECT 218.0 06/18/2018   LDLCALC 230 (H) 02/22/2020   ALT 23 04/06/2020   AST 19 04/06/2020   NA 141 02/22/2020   K 4.0 02/22/2020   CL 104 02/22/2020   CREATININE 0.90 05/11/2020   BUN 11 02/22/2020   CO2 30 02/22/2020   TSH 3.51  06/24/2019   INR 1.1 12/03/2019   HGBA1C 5.3 02/22/2020    MM 3D SCREEN BREAST BILATERAL  Result Date: 03/08/2020 CLINICAL DATA:  Screening. EXAM: DIGITAL SCREENING BILATERAL MAMMOGRAM WITH TOMO AND CAD COMPARISON:  Previous exam(s). ACR Breast Density Category b: There are scattered areas of fibroglandular density. FINDINGS: There are no findings suspicious for malignancy. Images were processed with CAD. IMPRESSION: No mammographic evidence of malignancy. A result letter of this screening mammogram will be mailed directly to the patient. RECOMMENDATION: Screening mammogram in one year. (Code:SM-B-01Y) BI-RADS CATEGORY  1: Negative. Electronically Signed   By: Lillia Mountain M.D.   On: 03/08/2020 13:47       Assessment & Plan:   Problem List Items Addressed This Visit    Anemia    Follow cbc.        Relevant Orders   CBC with Differential/Platelet   IBC +  Ferritin   Chronic back pain    Followed by pain clinic.        GERD (gastroesophageal reflux disease)    Acid reflux.  On nexium.  Add pepcid.  Follow.        Hypercholesterolemia    On lipitor.  Low cholesterol diet and exercise.  Follow lipid panel and liver function tests.        Relevant Orders   Hepatic function panel   Lipid panel   Hyperglycemia    Low carb diet and exercise.  Follow met b and a1c.       Relevant Orders   Hemoglobin E0I   Basic metabolic panel   Mild depression (HCC)    On prozac.  Seeing psychiatry.        SOB (shortness of breath)    No sob with exertion.  Occasional feeling of not being able to get a good breath in.  Some constipation.  Miralax.  Check cxr.  Treat acid.        Relevant Orders   DG Chest 2 View (Completed)   Status post total hip replacement, right    Seeing ortho.  Increased pain recently.  Planning for aspiration.        Stress    On prozac.  Seeing psychiatry.            [name], MD

## 2020-05-08 NOTE — Patient Instructions (Signed)
Start pepcid 20mg  - take 30 minutes before evening meal.

## 2020-05-09 ENCOUNTER — Other Ambulatory Visit: Payer: Self-pay | Admitting: Orthopedic Surgery

## 2020-05-10 ENCOUNTER — Other Ambulatory Visit: Payer: Self-pay

## 2020-05-10 ENCOUNTER — Other Ambulatory Visit
Admission: RE | Admit: 2020-05-10 | Discharge: 2020-05-10 | Disposition: A | Discharge status: Home / Self Care | Payer: Medicare HMO | Source: Ambulatory Visit | Attending: Orthopedic Surgery | Admitting: Orthopedic Surgery

## 2020-05-10 DIAGNOSIS — Z01812 Encounter for preprocedural laboratory examination: Secondary | ICD-10-CM | POA: Diagnosis not present

## 2020-05-10 DIAGNOSIS — Z20822 Contact with and (suspected) exposure to covid-19: Secondary | ICD-10-CM | POA: Diagnosis not present

## 2020-05-10 LAB — SARS CORONAVIRUS 2 (TAT 6-24 HRS): SARS Coronavirus 2: NEGATIVE

## 2020-05-11 ENCOUNTER — Ambulatory Visit
Admission: RE | Admit: 2020-05-11 | Discharge: 2020-05-11 | Disposition: A | Discharge status: Home / Self Care | Payer: Self-pay | Source: Ambulatory Visit | Attending: Orthopedic Surgery | Admitting: Orthopedic Surgery

## 2020-05-11 ENCOUNTER — Encounter: Payer: Self-pay | Admitting: Orthopedic Surgery

## 2020-05-11 ENCOUNTER — Ambulatory Visit
Admission: RE | Admit: 2020-05-11 | Discharge: 2020-05-12 | Disposition: A | Discharge status: Home / Self Care | Payer: Medicare HMO | Attending: Orthopedic Surgery | Admitting: Orthopedic Surgery

## 2020-05-11 ENCOUNTER — Ambulatory Visit: Payer: Medicare HMO | Admitting: Anesthesiology

## 2020-05-11 ENCOUNTER — Other Ambulatory Visit: Payer: Self-pay | Admitting: Orthopedic Surgery

## 2020-05-11 ENCOUNTER — Other Ambulatory Visit: Payer: Self-pay

## 2020-05-11 ENCOUNTER — Encounter
Admission: RE | Disposition: A | Discharge status: Home / Self Care | Payer: Self-pay | Source: Home / Self Care | Attending: Orthopedic Surgery

## 2020-05-11 DIAGNOSIS — M25451 Effusion, right hip: Secondary | ICD-10-CM | POA: Diagnosis not present

## 2020-05-11 DIAGNOSIS — G4730 Sleep apnea, unspecified: Secondary | ICD-10-CM | POA: Diagnosis not present

## 2020-05-11 DIAGNOSIS — T8141XA Infection following a procedure, superficial incisional surgical site, initial encounter: Secondary | ICD-10-CM | POA: Diagnosis not present

## 2020-05-11 DIAGNOSIS — Z6838 Body mass index (BMI) 38.0-38.9, adult: Secondary | ICD-10-CM | POA: Diagnosis not present

## 2020-05-11 DIAGNOSIS — L02415 Cutaneous abscess of right lower limb: Secondary | ICD-10-CM | POA: Diagnosis present

## 2020-05-11 DIAGNOSIS — K219 Gastro-esophageal reflux disease without esophagitis: Secondary | ICD-10-CM | POA: Insufficient documentation

## 2020-05-11 DIAGNOSIS — M25551 Pain in right hip: Secondary | ICD-10-CM | POA: Insufficient documentation

## 2020-05-11 DIAGNOSIS — E785 Hyperlipidemia, unspecified: Secondary | ICD-10-CM | POA: Diagnosis not present

## 2020-05-11 DIAGNOSIS — Y831 Surgical operation with implant of artificial internal device as the cause of abnormal reaction of the patient, or of later complication, without mention of misadventure at the time of the procedure: Secondary | ICD-10-CM | POA: Diagnosis not present

## 2020-05-11 DIAGNOSIS — M25559 Pain in unspecified hip: Secondary | ICD-10-CM

## 2020-05-11 DIAGNOSIS — F329 Major depressive disorder, single episode, unspecified: Secondary | ICD-10-CM | POA: Diagnosis not present

## 2020-05-11 DIAGNOSIS — Z880 Allergy status to penicillin: Secondary | ICD-10-CM | POA: Diagnosis not present

## 2020-05-11 DIAGNOSIS — E538 Deficiency of other specified B group vitamins: Secondary | ICD-10-CM | POA: Insufficient documentation

## 2020-05-11 DIAGNOSIS — Z881 Allergy status to other antibiotic agents status: Secondary | ICD-10-CM | POA: Insufficient documentation

## 2020-05-11 DIAGNOSIS — G44209 Tension-type headache, unspecified, not intractable: Secondary | ICD-10-CM | POA: Diagnosis not present

## 2020-05-11 DIAGNOSIS — E669 Obesity, unspecified: Secondary | ICD-10-CM | POA: Insufficient documentation

## 2020-05-11 DIAGNOSIS — M1611 Unilateral primary osteoarthritis, right hip: Secondary | ICD-10-CM | POA: Diagnosis not present

## 2020-05-11 DIAGNOSIS — Z96641 Presence of right artificial hip joint: Secondary | ICD-10-CM | POA: Diagnosis not present

## 2020-05-11 DIAGNOSIS — Z79899 Other long term (current) drug therapy: Secondary | ICD-10-CM | POA: Insufficient documentation

## 2020-05-11 HISTORY — PX: INCISION AND DRAINAGE HIP: SHX1801

## 2020-05-11 HISTORY — DX: Cutaneous abscess of right lower limb: L02.415

## 2020-05-11 LAB — CBC
HCT: 36.4 % (ref 36.0–46.0)
Hemoglobin: 11.9 g/dL — ABNORMAL LOW (ref 12.0–15.0)
MCH: 28.3 pg (ref 26.0–34.0)
MCHC: 32.7 g/dL (ref 30.0–36.0)
MCV: 86.5 fL (ref 80.0–100.0)
Platelets: 276 10*3/uL (ref 150–400)
RBC: 4.21 MIL/uL (ref 3.87–5.11)
RDW: 13.1 % (ref 11.5–15.5)
WBC: 8.7 10*3/uL (ref 4.0–10.5)
nRBC: 0 % (ref 0.0–0.2)

## 2020-05-11 LAB — CREATININE, SERUM
Creatinine, Ser: 0.9 mg/dL (ref 0.44–1.00)
GFR calc Af Amer: >60 mL/min (ref >60)
GFR calc non Af Amer: >60 mL/min (ref >60)

## 2020-05-11 SURGERY — IRRIGATION AND DEBRIDEMENT HIP
Anesthesia: General | Site: Hip | Laterality: Right

## 2020-05-11 MED ORDER — SCOPOLAMINE 1 MG/3DAYS TD PT72
1.0000 | MEDICATED_PATCH | Freq: Once | TRANSDERMAL | Status: DC
  Administered 2020-05-11: 1.5 mg via TRANSDERMAL

## 2020-05-11 MED ORDER — MORPHINE SULFATE (PF) 2 MG/ML IV SOLN
0.5000 mg | INTRAVENOUS | Status: DC | PRN
  Administered 2020-05-11 – 2020-05-12 (×3): 1 mg via INTRAVENOUS
  Filled 2020-05-11 (×3): qty 1

## 2020-05-11 MED ORDER — POTASSIUM CHLORIDE CRYS ER 20 MEQ PO TBCR: 10.0000 meq | EXTENDED_RELEASE_TABLET | Freq: Every day | ORAL | Status: DC | PRN

## 2020-05-11 MED ORDER — ONDANSETRON HCL 4 MG/2ML IJ SOLN
INTRAMUSCULAR | Status: DC | PRN
  Administered 2020-05-11: 4 mg via INTRAVENOUS

## 2020-05-11 MED ORDER — GENTAMICIN SULFATE 40 MG/ML IJ SOLN
INTRAMUSCULAR | Status: AC
  Filled 2020-05-11: qty 8

## 2020-05-11 MED ORDER — PROPOFOL 10 MG/ML IV BOLUS
INTRAVENOUS | Status: AC
  Filled 2020-05-11: qty 20

## 2020-05-11 MED ORDER — METOCLOPRAMIDE HCL 10 MG PO TABS: 5.0000 mg | ORAL_TABLET | Freq: Three times a day (TID) | ORAL | Status: DC | PRN

## 2020-05-11 MED ORDER — FENTANYL CITRATE (PF) 100 MCG/2ML IJ SOLN
INTRAMUSCULAR | Status: DC | PRN
  Administered 2020-05-11 (×4): 25 ug via INTRAVENOUS

## 2020-05-11 MED ORDER — ASCORBIC ACID 500 MG PO TABS
500.0000 mg | ORAL_TABLET | Freq: Every day | ORAL | Status: DC
  Administered 2020-05-12: 500 mg via ORAL
  Filled 2020-05-11: qty 1

## 2020-05-11 MED ORDER — FENTANYL CITRATE (PF) 100 MCG/2ML IJ SOLN
INTRAMUSCULAR | Status: AC
  Filled 2020-05-11: qty 2

## 2020-05-11 MED ORDER — VANCOMYCIN HCL 1000 MG IV SOLR
INTRAVENOUS | Status: AC
  Filled 2020-05-11: qty 1000

## 2020-05-11 MED ORDER — PROPOFOL 10 MG/ML IV BOLUS
INTRAVENOUS | Status: DC | PRN
  Administered 2020-05-11: 200 mg via INTRAVENOUS

## 2020-05-11 MED ORDER — FENTANYL CITRATE (PF) 100 MCG/2ML IJ SOLN
25.0000 ug | INTRAMUSCULAR | Status: DC | PRN
  Administered 2020-05-11 (×3): 25 ug via INTRAVENOUS

## 2020-05-11 MED ORDER — TRAMADOL HCL 50 MG PO TABS
50.0000 mg | ORAL_TABLET | Freq: Four times a day (QID) | ORAL | Status: DC
  Administered 2020-05-11 – 2020-05-12 (×3): 50 mg via ORAL
  Filled 2020-05-11 (×3): qty 1

## 2020-05-11 MED ORDER — OXYCODONE HCL 5 MG PO TABS
10.0000 mg | ORAL_TABLET | Freq: Every day | ORAL | Status: DC
  Administered 2020-05-11 – 2020-05-12 (×5): 10 mg via ORAL
  Filled 2020-05-11 (×5): qty 2

## 2020-05-11 MED ORDER — PHENYLEPHRINE HCL (PRESSORS) 10 MG/ML IV SOLN
INTRAVENOUS | Status: DC | PRN
  Administered 2020-05-11: 100 ug via INTRAVENOUS

## 2020-05-11 MED ORDER — FUROSEMIDE 20 MG PO TABS: 20.0000 mg | ORAL_TABLET | Freq: Every day | ORAL | Status: DC | PRN

## 2020-05-11 MED ORDER — ONDANSETRON HCL 4 MG/2ML IJ SOLN: 4.0000 mg | Freq: Four times a day (QID) | INTRAMUSCULAR | Status: DC | PRN

## 2020-05-11 MED ORDER — FENTANYL CITRATE (PF) 100 MCG/2ML IJ SOLN
INTRAMUSCULAR | Status: AC
  Administered 2020-05-11: 50 ug via INTRAVENOUS
  Filled 2020-05-11: qty 2

## 2020-05-11 MED ORDER — CHLORHEXIDINE GLUCONATE 0.12 % MT SOLN: 15.0000 mL | Freq: Once | OROMUCOSAL | Status: AC

## 2020-05-11 MED ORDER — DIPHENHYDRAMINE HCL 50 MG/ML IJ SOLN
INTRAMUSCULAR | Status: AC
  Filled 2020-05-11: qty 1

## 2020-05-11 MED ORDER — PROMETHAZINE HCL 25 MG/ML IJ SOLN: 6.2500 mg | INTRAMUSCULAR | Status: DC | PRN

## 2020-05-11 MED ORDER — SODIUM CHLORIDE 0.9 % IV SOLN
INTRAVENOUS | Status: DC | PRN
  Administered 2020-05-11: 1000 mL

## 2020-05-11 MED ORDER — VANCOMYCIN HCL IN DEXTROSE 1-5 GM/200ML-% IV SOLN
1000.0000 mg | Freq: Two times a day (BID) | INTRAVENOUS | Status: DC
  Administered 2020-05-11 – 2020-05-12 (×2): 1000 mg via INTRAVENOUS
  Filled 2020-05-11 (×3): qty 200

## 2020-05-11 MED ORDER — IBUPROFEN 800 MG PO TABS
ORAL_TABLET | ORAL | Status: AC
  Filled 2020-05-11: qty 1

## 2020-05-11 MED ORDER — ACETAMINOPHEN 325 MG PO TABS
325.0000 mg | ORAL_TABLET | Freq: Four times a day (QID) | ORAL | Status: DC | PRN
  Administered 2020-05-12: 650 mg via ORAL
  Filled 2020-05-11: qty 2

## 2020-05-11 MED ORDER — DIPHENHYDRAMINE HCL 12.5 MG/5ML PO ELIX: 12.5000 mg | ORAL_SOLUTION | ORAL | Status: DC | PRN

## 2020-05-11 MED ORDER — LACTASE 3000 UNITS PO TABS
9000.0000 [IU] | ORAL_TABLET | Freq: Three times a day (TID) | ORAL | Status: DC | PRN
  Filled 2020-05-11: qty 3

## 2020-05-11 MED ORDER — CHLORHEXIDINE GLUCONATE 0.12 % MT SOLN
OROMUCOSAL | Status: AC
  Administered 2020-05-11: 15 mL via OROMUCOSAL
  Filled 2020-05-11: qty 15

## 2020-05-11 MED ORDER — METOCLOPRAMIDE HCL 5 MG/ML IJ SOLN: 5.0000 mg | Freq: Three times a day (TID) | INTRAMUSCULAR | Status: DC | PRN

## 2020-05-11 MED ORDER — FENTANYL CITRATE (PF) 100 MCG/2ML IJ SOLN
INTRAMUSCULAR | Status: AC
  Administered 2020-05-11: 25 ug via INTRAVENOUS
  Filled 2020-05-11: qty 2

## 2020-05-11 MED ORDER — ORAL CARE MOUTH RINSE: 15.0000 mL | Freq: Once | OROMUCOSAL | Status: AC

## 2020-05-11 MED ORDER — GABAPENTIN 400 MG PO CAPS
400.0000 mg | ORAL_CAPSULE | Freq: Three times a day (TID) | ORAL | Status: DC
  Administered 2020-05-11 – 2020-05-12 (×2): 400 mg via ORAL
  Filled 2020-05-11 (×2): qty 1

## 2020-05-11 MED ORDER — ZOLPIDEM TARTRATE 5 MG PO TABS: 5.0000 mg | ORAL_TABLET | Freq: Every evening | ORAL | Status: DC | PRN

## 2020-05-11 MED ORDER — CYANOCOBALAMIN 1000 MCG/ML IJ SOLN: 1000.0000 ug | INTRAMUSCULAR | Status: DC

## 2020-05-11 MED ORDER — HYDROCODONE-ACETAMINOPHEN 5-325 MG PO TABS: 1.0000 | ORAL_TABLET | ORAL | Status: DC | PRN

## 2020-05-11 MED ORDER — DIAZEPAM 5 MG PO TABS: 5.0000 mg | ORAL_TABLET | Freq: Every day | ORAL | Status: DC | PRN

## 2020-05-11 MED ORDER — TOPIRAMATE 25 MG PO TABS
25.0000 mg | ORAL_TABLET | Freq: Every day | ORAL | Status: DC | PRN
  Filled 2020-05-11: qty 1

## 2020-05-11 MED ORDER — VANCOMYCIN HCL 1000 MG IV SOLR
INTRAVENOUS | Status: DC | PRN
  Administered 2020-05-11: 1000 mg

## 2020-05-11 MED ORDER — VANCOMYCIN HCL 1000 MG IV SOLR
INTRAVENOUS | Status: DC | PRN
  Administered 2020-05-11: 1000 mg via INTRAVENOUS

## 2020-05-11 MED ORDER — SCOPOLAMINE 1 MG/3DAYS TD PT72
MEDICATED_PATCH | TRANSDERMAL | Status: AC
  Filled 2020-05-11: qty 1

## 2020-05-11 MED ORDER — MIDAZOLAM HCL 2 MG/2ML IJ SOLN
INTRAMUSCULAR | Status: AC
  Filled 2020-05-11: qty 2

## 2020-05-11 MED ORDER — LIDOCAINE HCL (CARDIAC) PF 100 MG/5ML IV SOSY
PREFILLED_SYRINGE | INTRAVENOUS | Status: DC | PRN
  Administered 2020-05-11: 100 mg via INTRAVENOUS

## 2020-05-11 MED ORDER — DIPHENHYDRAMINE HCL 50 MG/ML IJ SOLN
INTRAMUSCULAR | Status: DC | PRN
  Administered 2020-05-11: 25 mg via INTRAVENOUS

## 2020-05-11 MED ORDER — METHOCARBAMOL 500 MG PO TABS
500.0000 mg | ORAL_TABLET | Freq: Three times a day (TID) | ORAL | Status: DC
  Administered 2020-05-11 – 2020-05-12 (×2): 500 mg via ORAL
  Filled 2020-05-11 (×2): qty 1

## 2020-05-11 MED ORDER — ACETAMINOPHEN 500 MG PO TABS: 500.0000 mg | ORAL_TABLET | Freq: Two times a day (BID) | ORAL | Status: DC | PRN

## 2020-05-11 MED ORDER — HYDROCODONE-ACETAMINOPHEN 7.5-325 MG PO TABS
1.0000 | ORAL_TABLET | ORAL | Status: DC | PRN
  Administered 2020-05-12: 1 via ORAL
  Filled 2020-05-11: qty 1

## 2020-05-11 MED ORDER — ENOXAPARIN SODIUM 40 MG/0.4ML ~~LOC~~ SOLN
40.0000 mg | SUBCUTANEOUS | Status: DC
  Administered 2020-05-12: 40 mg via SUBCUTANEOUS
  Filled 2020-05-11: qty 0.4

## 2020-05-11 MED ORDER — DOCUSATE SODIUM 100 MG PO CAPS
100.0000 mg | ORAL_CAPSULE | Freq: Two times a day (BID) | ORAL | Status: DC
  Administered 2020-05-11 – 2020-05-12 (×2): 100 mg via ORAL
  Filled 2020-05-11 (×2): qty 1

## 2020-05-11 MED ORDER — MIDAZOLAM HCL 2 MG/2ML IJ SOLN
INTRAMUSCULAR | Status: DC | PRN
  Administered 2020-05-11: 2 mg via INTRAVENOUS

## 2020-05-11 MED ORDER — SIMETHICONE 80 MG PO CHEW
125.0000 mg | CHEWABLE_TABLET | Freq: Two times a day (BID) | ORAL | Status: DC | PRN
  Filled 2020-05-11: qty 2

## 2020-05-11 MED ORDER — METHOCARBAMOL 500 MG PO TABS
500.0000 mg | ORAL_TABLET | Freq: Four times a day (QID) | ORAL | Status: DC | PRN
  Administered 2020-05-11: 500 mg via ORAL
  Filled 2020-05-11: qty 1

## 2020-05-11 MED ORDER — FENTANYL CITRATE (PF) 100 MCG/2ML IJ SOLN
50.0000 ug | Freq: Once | INTRAMUSCULAR | Status: AC
  Administered 2020-05-11: 50 ug via INTRAVENOUS

## 2020-05-11 MED ORDER — NALOXONE HCL 4 MG/0.1ML NA LIQD
0.4000 mg | Freq: Once | NASAL | Status: DC | PRN
  Filled 2020-05-11: qty 8

## 2020-05-11 MED ORDER — SODIUM CHLORIDE 0.9 % IV SOLN: INTRAVENOUS | Status: DC

## 2020-05-11 MED ORDER — IBUPROFEN 400 MG PO TABS
800.0000 mg | ORAL_TABLET | Freq: Three times a day (TID) | ORAL | Status: DC | PRN
  Administered 2020-05-11: 800 mg via ORAL
  Filled 2020-05-11 (×2): qty 1

## 2020-05-11 MED ORDER — FLUOXETINE HCL 20 MG PO CAPS
20.0000 mg | ORAL_CAPSULE | Freq: Every day | ORAL | Status: DC
  Administered 2020-05-12: 20 mg via ORAL
  Filled 2020-05-11 (×2): qty 1

## 2020-05-11 MED ORDER — METHOCARBAMOL 1000 MG/10ML IJ SOLN
500.0000 mg | Freq: Four times a day (QID) | INTRAVENOUS | Status: DC | PRN
  Filled 2020-05-11: qty 5

## 2020-05-11 MED ORDER — ONDANSETRON HCL 4 MG PO TABS: 4.0000 mg | ORAL_TABLET | Freq: Four times a day (QID) | ORAL | Status: DC | PRN

## 2020-05-11 MED ORDER — LACTATED RINGERS IV SOLN: INTRAVENOUS | Status: DC

## 2020-05-11 MED ORDER — ATORVASTATIN CALCIUM 10 MG PO TABS
10.0000 mg | ORAL_TABLET | Freq: Every day | ORAL | Status: DC
  Administered 2020-05-11: 10 mg via ORAL
  Filled 2020-05-11: qty 1

## 2020-05-11 MED ORDER — HYDROCODONE-ACETAMINOPHEN 5-325 MG PO TABS
ORAL_TABLET | ORAL | Status: AC
  Administered 2020-05-11: 1 via ORAL
  Filled 2020-05-11: qty 1

## 2020-05-11 MED ORDER — ESTRADIOL 0.1 MG/GM VA CREA
1.0000 | TOPICAL_CREAM | VAGINAL | Status: DC
  Filled 2020-05-11: qty 42.5

## 2020-05-11 MED ORDER — PANTOPRAZOLE SODIUM 40 MG PO TBEC
80.0000 mg | DELAYED_RELEASE_TABLET | Freq: Every day | ORAL | Status: DC
  Administered 2020-05-12: 80 mg via ORAL
  Filled 2020-05-11: qty 2

## 2020-05-11 MED ORDER — SUMATRIPTAN SUCCINATE 50 MG PO TABS
50.0000 mg | ORAL_TABLET | ORAL | Status: DC | PRN
  Filled 2020-05-11: qty 1

## 2020-05-11 SURGICAL SUPPLY — 52 items
APL PRP STRL LF DISP 70% ISPRP (MISCELLANEOUS) ×1
CANISTER SUCT 1200ML W/VALVE (MISCELLANEOUS) ×3 IMPLANT
CANISTER SUCT 3000ML PPV (MISCELLANEOUS) ×3 IMPLANT
CANISTER WOUND CARE 500ML ATS (WOUND CARE) ×3 IMPLANT
CHLORAPREP W/TINT 26 (MISCELLANEOUS) ×3 IMPLANT
COVER WAND RF STERILE (DRAPES) ×3 IMPLANT
DRAPE 3/4 80X56 (DRAPES) ×3 IMPLANT
DRAPE INCISE IOBAN 66X60 STRL (DRAPES) ×3 IMPLANT
DRSG EMULSION OIL 3X8 NADH (GAUZE/BANDAGES/DRESSINGS) ×3 IMPLANT
DRSG MEPITEL 4X7.2 (GAUZE/BANDAGES/DRESSINGS) IMPLANT
ELECT CAUTERY BLADE 6.4 (BLADE) ×3 IMPLANT
ELECT REM PT RETURN 9FT ADLT (ELECTROSURGICAL) ×3
ELECTRODE REM PT RTRN 9FT ADLT (ELECTROSURGICAL) ×1 IMPLANT
GAUZE SPONGE 4X4 12PLY STRL (GAUZE/BANDAGES/DRESSINGS) ×3 IMPLANT
GAUZE XEROFORM 1X8 LF (GAUZE/BANDAGES/DRESSINGS) ×2 IMPLANT
GLOVE SURG SYN 9.0  PF PI (GLOVE) ×3
GLOVE SURG SYN 9.0 PF PI (GLOVE) ×1 IMPLANT
GOWN SRG 2XL LVL 4 RGLN SLV (GOWNS) ×1 IMPLANT
GOWN STRL NON-REIN 2XL LVL4 (GOWNS) ×3
GOWN STRL REUS W/ TWL LRG LVL3 (GOWN DISPOSABLE) ×1 IMPLANT
GOWN STRL REUS W/TWL LRG LVL3 (GOWN DISPOSABLE) ×3
HEMOVAC 400ML (MISCELLANEOUS) ×3
KIT DRAIN HEMOVAC JP 7FR 400ML (MISCELLANEOUS) ×1 IMPLANT
KIT PREVENA INCISION MGT 13 (CANNISTER) ×2 IMPLANT
KIT STIMULAN RAPID CURE 5CC (Orthopedic Implant) ×2 IMPLANT
KIT TURNOVER KIT A (KITS) ×3 IMPLANT
NDL FILTER BLUNT 18X1 1/2 (NEEDLE) ×1 IMPLANT
NDL SAFETY ECLIPSE 18X1.5 (NEEDLE) ×1 IMPLANT
NEEDLE FILTER BLUNT 18X 1/2SAF (NEEDLE) ×2
NEEDLE FILTER BLUNT 18X1 1/2 (NEEDLE) ×1 IMPLANT
NEEDLE HYPO 18GX1.5 SHARP (NEEDLE) ×3
NS IRRIG 1000ML POUR BTL (IV SOLUTION) ×3 IMPLANT
PACK HIP PROSTHESIS (MISCELLANEOUS) ×3 IMPLANT
PULSAVAC PLUS IRRIG FAN TIP (DISPOSABLE) ×3
SCALPEL PROTECTED #10 DISP (BLADE) ×6 IMPLANT
SLEEVE SCD COMPRESS THIGH MED (MISCELLANEOUS) ×2 IMPLANT
SOL .9 NS 3000ML IRR  AL (IV SOLUTION) ×6
SOL .9 NS 3000ML IRR AL (IV SOLUTION) ×2
SOL .9 NS 3000ML IRR UROMATIC (IV SOLUTION) IMPLANT
STAPLER SKIN PROX 35W (STAPLE) ×3 IMPLANT
SUT ETHIBOND #5 BRAIDED 30INL (SUTURE) ×1 IMPLANT
SUT TICRON 2-0 30IN 311381 (SUTURE) ×3 IMPLANT
SUT VIC AB 0 CT1 27 (SUTURE)
SUT VIC AB 0 CT1 27XCR 8 STRN (SUTURE) ×2 IMPLANT
SUT VIC AB 1 CTX 27 (SUTURE) ×6 IMPLANT
SUT VIC AB 2-0 CT1 27 (SUTURE) ×3
SUT VIC AB 2-0 CT1 TAPERPNT 27 (SUTURE) ×1 IMPLANT
SWAB CULTURE AMIES ANAERIB BLU (MISCELLANEOUS) ×7 IMPLANT
SYR 10ML LL (SYRINGE) ×3 IMPLANT
TAPE MICROFOAM 4IN (TAPE) ×3 IMPLANT
TIP BRUSH PULSAVAC PLUS 24.33 (MISCELLANEOUS) ×3 IMPLANT
TIP FAN IRRIG PULSAVAC PLUS (DISPOSABLE) ×1 IMPLANT

## 2020-05-11 NOTE — Anesthesia Procedure Notes (Signed)
Procedure Name: LMA Insertion Performed by: Fredderick Phenix, CRNA Pre-anesthesia Checklist: Patient identified, Emergency Drugs available, Suction available and Patient being monitored Patient Re-evaluated:Patient Re-evaluated prior to induction Oxygen Delivery Method: Circle system utilized Preoxygenation: Pre-oxygenation with 100% oxygen Induction Type: IV induction Ventilation: Mask ventilation without difficulty LMA: LMA inserted LMA Size: 3.5 Tube type: Oral Number of attempts: 1 Airway Equipment and Method: Oral airway Placement Confirmation: breath sounds checked- equal and bilateral Tube secured with: Tape Dental Injury: Teeth and Oropharynx as per pre-operative assessment

## 2020-05-11 NOTE — Op Note (Signed)
05/11/2020  2:30 PM  PATIENT:  Kathleen Everett  49 y.o. female  PRE-OPERATIVE DIAGNOSIS:  Right hip pain M25.551 Primary osteoarthritis of right hip M16.11 Effusion of right hip M25.451  POST-OPERATIVE DIAGNOSIS: Right hip abscess subcutaneous tissue with right total hip present  PROCEDURE:  Procedure(s): IRRIGATION AND DEBRIDEMENT HIP (Right)  SURGEON: Laurene Footman, MD  ASSISTANTS: None  ANESTHESIA:   general  EBL:  Total I/O In: 250 [IV Piggyback:250] Out: 50 [Blood:50]  BLOOD ADMINISTERED:none  DRAINS: none   LOCAL MEDICATIONS USED:  NONE  SPECIMEN:  Source of Specimen:  Multiple cultures from abscess  DISPOSITION OF SPECIMEN:  Microbiology  COUNTS:  YES  TOURNIQUET:  * No tourniquets in log *  IMPLANTS: Stimulant beads with vancomycin placed in hip joint  DICTATION: .Dragon Dictation patient was brought to the operating room and after adequate general anesthesia was obtained the right hip was prepped and draped in the usual sterile fashion with a bump underneath the right buttock.  After patient identification and timeout procedures were completed the prior surgical scar was elliptically excised to obtain fresh skin for subsequent wound healing.  Just under the skin there is a thick layer of pus approximately as 2 cm wide approximately 10 cm centimeters long and 2 cm deep.  This was cultured multiple times.  Following this this layer was thoroughly irrigated with 3 L of abate dilute Betadine solution followed by gentamicin solution.  Edges were debrided with use of a scalpel as well as rongeured to get rid of any devitalized tissue.  After this layer looked to be clean the deep fascia was incised this was all above the layer of the fascia and the VAC ATFL retracted laterally such that the anterior hip could be exposed.  The capsulotomy was performed with a long longitudinal incision in the joint space entered with minimal fluid present.  This had previously been  aspirated and was negative for infection.  With the presence of a more superficial infection is felt some treatment should be performed and the joint was irrigated with pulse lavage with gentamicin solution followed by placement of antibiotic beads into the joint with closure of the deep fascia oral over this with a running oh #1 Vicryl followed by loose closure with 2-0 Vicryl to allow for drainage and skin staples followed by incisional wound VAC.  PLAN OF CARE: Admit for overnight observation  PATIENT DISPOSITION:  PACU - hemodynamically stable.

## 2020-05-11 NOTE — H&P (Signed)
Chief Complaint  Patient presents with  . Follow-up  Rt Hip Pain, S/P THA   History of the Present Illness: Kathleen Everett is a 49 y.o. female here for follow-up evaluation status post right total hip arthroplasty preformed on 12/28/2019. She recently had increasing hip pain and comes in for a recheck.   The patient states she has had intermittent right hip pain. Last week, she did some walking and experienced excruciating pain. She noticed some redness and warmth to the right hip. The patient states she is still having back pain, but not in the posterior aspect of the right leg. She has been taking meloxicam, which has helped some.  I have reviewed past medical, surgical, social and family history, and allergies as documented in the EMR.  Past Medical History: Past Medical History:  Diagnosis Date  . Chronic tension headaches  . Depression  . Diverticulitis  . Dysmenorrhea  . Endometriosis  . Gastroesophageal reflux disease without esophagitis 07/18/2015  . Hidradenitis  . History of dysmenorrhea  . History of endometriosis  . History of ovarian cyst  followed by Dr. Sena Slate, stable  . Hyperlipidemia  . Obesity  . Osteoarthritis  . Sleep apnea   Past Surgical History: Past Surgical History:  Procedure Laterality Date  . BREAST EXCISIONAL BIOPSY Left  for benigh fiber adenoma  . [place] 2004  1999-due to breech and 2004- due to twins  . COLONOSCOPY 08/17/2009  FH Colon Polyps (Father/Mother)  . COLONOSCOPY 03/23/2012  (Inpt) Colitis, FH Colon Polyps (Father/Mother): CBF 03/2017  . COLONOSCOPY 04/28/2015  FH Colon Polyps (Father/Mother): CBF 04/2020  . CYSTOSCOPY  for bladder ulcers  . CYSTOSCOPY Right 2001  Ovarian  . EGD 08/17/2009  . EGD 04/28/2015  No repeat per RTE  . Hernia surgery July 2003  . HYSTERECTOMY 03/27/07  laparoscopic  . JOINT REPLACEMENT Right 12/28/2019  Rudene Christians  . Surgery for chiari decompression  . TEMPOROMANDIBULAR JOINT  ARTHROPLASTY 2000   Past Family History: Family History  Problem Relation Age of Onset  . Gout Mother  . Anxiety Mother  . Hyperlipidemia (Elevated cholesterol) Mother  . Colon polyps Mother  . Heart disease Father  . Stroke Father  . High blood pressure (Hypertension) Father  . Anxiety Father  . Hyperlipidemia (Elevated cholesterol) Father  . Colon polyps Father  . Glaucoma Maternal Grandfather  . Glaucoma Paternal Grandmother  . Cirrhosis Maternal Grandmother   Medications: Current Outpatient Medications Ordered in Epic  Medication Sig Dispense Refill  . acetaminophen (TYLENOL) 500 MG tablet Take by mouth  . ascorbic acid, vitamin C, 500 mg Chew Take by mouth  . atorvastatin (LIPITOR) 10 MG tablet Take 1 tablet by mouth once daily  . calcium carbonate (TUMS) 200 mg calcium (500 mg) chewable tablet Take by mouth  . cyanocobalamin (VITAMIN B12) 1,000 mcg/mL injection INJECT ONE ML (CC) INTRAMUSCULARLY ONCE EVERY MONTH  . diazePAM (VALIUM) 5 MG tablet TAKE 1 TABLET BY MOUTH ONCE DAILY AS NEEDED FOR ANXIETY OR SLEEP  . diclofenac (VOLTAREN) 1 % topical gel Apply topically  . esomeprazole (NEXIUM) 20 MG DR capsule Take 20 mg by mouth once daily  . FLUoxetine (PROZAC) 20 MG capsule Take 20 mg by mouth once daily  . gabapentin (NEURONTIN) 400 MG capsule Take 400 mg by mouth 2 (two) times daily  . hydroCHLOROthiazide (HYDRODIURIL) 25 MG tablet Take 1 tablet by mouth once daily  . methocarbamoL (ROBAXIN) 500 MG tablet TAKE 1 TABLET BY MOUTH THREE TIMES DAILY  30 tablet 0  . naloxone (NARCAN) 4 mg/actuation nasal spray 1 spray by Nasal route as needed  . nystatin (MYCOSTATIN) 100,000 unit/gram cream APPLY TOPICALLY TWICE DAILY.  Marland Kitchen oxyCODONE (OXYCONTIN) 10 MG CR tablet Take 10 mg by mouth every 12 (twelve) hours  . rizatriptan (MAXALT) 10 MG tablet Take 1 tablet by mouth as directed For Migraines  . simethicone (MYLICON) 80 MG chewable tablet Take by mouth  . topiramate (TOPAMAX) 25 MG  tablet Take 25 mg by mouth once daily   No current Epic-ordered facility-administered medications on file.   Allergies: Allergies  Allergen Reactions  . Penicillins Other (See Comments)  Other Reaction: Other reaction  . Sulfa (Sulfonamide Antibiotics) Other (See Comments)  Other Reaction: Other reaction  . Vancomycin Rash  . Celexa [Citalopram] Diarrhea  . Cefuroxime Axetil Diarrhea  Upset stomach  . Clindamycin Hcl Rash    Body mass index is 38.02 kg/m.  Review of Systems: A comprehensive 14 point ROS was performed, reviewed, and the pertinent orthopaedic findings are documented in the HPI.  There were no vitals filed for this visit.  General Physical Examination:  General/Constitutional: No apparent distress: well-nourished and well developed. Eyes: Pupils equal, round with synchronous movement. Lungs: Clear to auscultation HEENT: Normal Vascular: No edema, swelling or tenderness, except as noted in detailed exam. Cardiac: Heart rate and rhythm is regular. Integumentary: No impressive skin lesions present, except as noted in detailed exam. Neuro/Psych: Normal mood and affect, oriented to person, place and time.  Musculoskeletal Examination: On exam, redness and warmth to the right hip.  Radiographs: AP pelvis and lateral x-rays of the right hip were ordered and personally reviewed today. These show intact hardware, some radiolucent lines around the proximal stem. Compared to 02/11/2020 radiographs, there is some increased lucency around the anterior stem on the lateral view. On the AP view, there is some lucency along the lateral aspect of the stem.  X-ray Impression Stable appearing status post Lovena Le component, possible increased radiolucencies around proximal stem. Distal stem appears well seated and intact.  Assessment: ICD-10-CM  1. Primary osteoarthritis of right hip M16.11  2. Status post total replacement of right hip Z96.641  3. Right hip pain M25.551    Plan: The patient has clinical findings of right hip pain status post right total hip arthroplasty.  We discussed the patient's x-ray findings. I explained I am concerned about a possible deep infection, so we will get some blood work today. If that is positive, we will need to aspirate her right hip. If it is positive, we will get her to see Dr. Candelaria Stagers on Monday to aspirate the right hip with an ultrasound. If the fluid looks like pus, we will have surgery next week.   The patient will follow up as needed.  Scribe Attestation: Standley Brooking, am acting as scribe for Lauris Poag, MD    Electronically signed by Lauris Poag, MD at 05/05/2020 8:39 PM EDT  Back to top of Progress Notes Ephriam Jenkins, [place] - 05/05/2020 8:15 AM EDT Formatting of this note might be different from the original. Review of Systems  Constitutional: Negative.  HENT: Negative.  Eyes: Negative.  Respiratory: Negative.  Cardiovascular: Negative.  Gastrointestinal: Negative.  Endocrine: Negative.  Genitourinary: Negative.  Musculoskeletal: Positive for arthralgias and myalgias.  Allergic/Immunologic: Negative.  Neurological: Negative.  Hematological: Negative.  Psychiatric/Behavioral: Negative.    Electronically signed by Ephriam Jenkins, [place] at 05/05/2020 8:39 PM EDT Subsequent hip aspiration by Dr. Candelaria Stagers did  not show infection but there seem to be an abscess in the subcutaneous tissue.  She is being brought in today for irrigation debridement of the subcutaneous abscess with additional irrigation of the hip joint with placement of antibiotic beads.

## 2020-05-11 NOTE — Transfer of Care (Signed)
Immediate Anesthesia Transfer of Care Note  Patient: Kathleen Everett  Procedure(s) Performed: IRRIGATION AND DEBRIDEMENT HIP (Right Hip)  Patient Location: PACU  Anesthesia Type:General  Level of Consciousness: awake and alert   Airway & Oxygen Therapy: Patient Spontanous Breathing and Patient connected to nasal cannula oxygen  Post-op Assessment: Report given to RN and Post -op Vital signs reviewed and stable  Post vital signs: Reviewed and stable  Last Vitals:  Vitals Value Taken Time  BP 119/69 05/11/20 1416  Temp    Pulse 69 05/11/20 1420  Resp 10 05/11/20 1420  SpO2 100 % 05/11/20 1420  Vitals shown include unvalidated device data.  Last Pain:  Vitals:   05/11/20 1416  TempSrc:   PainSc: (P) 0-No pain         Complications: No complications documented.

## 2020-05-11 NOTE — Plan of Care (Signed)
  Problem: Health Behavior/Discharge Planning: Goal: Ability to manage health-related needs will improve Outcome: Progressing   Problem: Clinical Measurements: Goal: Will remain free from infection Outcome: Progressing Goal: Respiratory complications will improve Outcome: Progressing   Problem: Activity: Goal: Risk for activity intolerance will decrease Outcome: Progressing   Problem: Nutrition: Goal: Adequate nutrition will be maintained Outcome: Progressing   Problem: Coping: Goal: Level of anxiety will decrease Outcome: Progressing   Problem: Elimination: Goal: Will not experience complications related to bowel motility Outcome: Progressing   Problem: Pain Managment: Goal: General experience of comfort will improve Outcome: Progressing   Problem: Safety: Goal: Ability to remain free from injury will improve Outcome: Progressing   Problem: Skin Integrity: Goal: Risk for impaired skin integrity will decrease Outcome: Progressing

## 2020-05-11 NOTE — Anesthesia Preprocedure Evaluation (Signed)
Anesthesia Evaluation  Patient identified by MRN, date of birth, ID band Patient awake    Reviewed: Allergy & Precautions, H&P , NPO status , Patient's Chart, lab work & pertinent test results  History of Anesthesia Complications (+) PONV and history of anesthetic complications  Airway Mallampati: II  TM Distance: >3 FB Neck ROM: full    Dental  (+) Teeth Intact   Pulmonary shortness of breath and with exertion, sleep apnea , neg recent URI,           Cardiovascular negative cardio ROS       Neuro/Psych  Headaches, neg Seizures PSYCHIATRIC DISORDERS Anxiety Depression Chiari malformation s/p surgical decompression    GI/Hepatic Neg liver ROS, GERD  Controlled,  Endo/Other  negative endocrine ROS  Renal/GU      Musculoskeletal   Abdominal   Peds  Hematology negative hematology ROS (+)   Anesthesia Other Findings Past Medical History: No date: Back pain No date: Chiari malformation     Comment:  s/p sgy decompression No date: Chronic tension headaches No date: Complication of anesthesia No date: DDD (degenerative disc disease), lumbar No date: Depression No date: Diverticulitis No date: Dysmenorrhea No date: Endometriosis No date: Gas pain No date: GERD (gastroesophageal reflux disease) No date: Hidradenitis No date: Hyperlipidemia No date: Obesity No date: Osteoarthritis No date: Ovarian cyst     Comment:  hx No date: PONV (postoperative nausea and vomiting) No date: Sleep apnea     Comment:  central sleep apnea  No CPAP right now No date: Vitamin B12 deficiency     Comment:  On injections  Past Surgical History: 03/27/07: ABDOMINAL HYSTERECTOMY     Comment:  laparoscopic 2004: BREAST EXCISIONAL BIOPSY; Left     Comment:  fibroadenoma No date: CESAREAN SECTION     Comment:  1999/2004 twins No date: chiari decompression 04/28/2015: COLONOSCOPY WITH PROPOFOL; N/A     Comment:  Procedure:  COLONOSCOPY WITH PROPOFOL;  Surgeon: Manya Silvas, MD;  Location: [facility];  Service:               Endoscopy;  Laterality: N/A; No date: CYSTOSCOPY     Comment:  bladder ulcers 04/28/2015: ESOPHAGOGASTRODUODENOSCOPY; N/A     Comment:  Procedure: ESOPHAGOGASTRODUODENOSCOPY (EGD);  Surgeon:               Manya Silvas, MD;  Location[facility] END[facility]COPY;                Service: Endoscopy;  Laterality: N/A; 2003: HERNIA REPAIR; Right     Comment:  inguinal 2001: OVARIAN CYST REMOVAL     Comment:  right 2000: TEMPOROMANDIBULAR JOINT SURGERY No date: TUBAL LIGATION     Comment:  Bilateral  BMI    Body Mass Index: 38.47 kg/m      Reproductive/Obstetrics negative OB ROS                             Anesthesia Physical  Anesthesia Plan  ASA: II  Anesthesia Plan: General   Post-op Pain Management:    Induction: Intravenous  PONV Risk Score and Plan: 4 or greater and Ondansetron, Dexamethasone, Midazolam, Scopolamine patch - Pre-op and Treatment may vary due to age or medical condition  Airway Management Planned: LMA and Oral ETT  Additional Equipment:   Intra-op Plan:   Post-operative Plan: Extubation in OR  Informed Consent: I have reviewed the patients History and Physical, chart, labs and discussed the procedure including the risks, benefits and alternatives for the proposed anesthesia with the patient or authorized representative who has indicated his/her understanding and acceptance.     Dental Advisory Given  Plan Discussed with: Anesthesiologist  Anesthesia Plan Comments:         Anesthesia Quick Evaluation

## 2020-05-12 ENCOUNTER — Encounter: Payer: Self-pay | Admitting: Internal Medicine

## 2020-05-12 ENCOUNTER — Encounter: Payer: Self-pay | Admitting: Orthopedic Surgery

## 2020-05-12 DIAGNOSIS — K219 Gastro-esophageal reflux disease without esophagitis: Secondary | ICD-10-CM | POA: Diagnosis not present

## 2020-05-12 DIAGNOSIS — G44209 Tension-type headache, unspecified, not intractable: Secondary | ICD-10-CM | POA: Diagnosis not present

## 2020-05-12 DIAGNOSIS — G4730 Sleep apnea, unspecified: Secondary | ICD-10-CM | POA: Diagnosis not present

## 2020-05-12 DIAGNOSIS — Z6838 Body mass index (BMI) 38.0-38.9, adult: Secondary | ICD-10-CM | POA: Diagnosis not present

## 2020-05-12 DIAGNOSIS — M25551 Pain in right hip: Secondary | ICD-10-CM | POA: Diagnosis not present

## 2020-05-12 DIAGNOSIS — T8141XA Infection following a procedure, superficial incisional surgical site, initial encounter: Secondary | ICD-10-CM | POA: Diagnosis not present

## 2020-05-12 DIAGNOSIS — E669 Obesity, unspecified: Secondary | ICD-10-CM | POA: Diagnosis not present

## 2020-05-12 DIAGNOSIS — L02415 Cutaneous abscess of right lower limb: Secondary | ICD-10-CM | POA: Diagnosis not present

## 2020-05-12 DIAGNOSIS — Z96641 Presence of right artificial hip joint: Secondary | ICD-10-CM | POA: Diagnosis not present

## 2020-05-12 MED ORDER — HYDROCODONE-ACETAMINOPHEN 5-325 MG PO TABS: 1.0000 | ORAL_TABLET | Freq: Four times a day (QID) | ORAL | 0 refills | Status: DC | PRN

## 2020-05-12 MED ORDER — DOXYCYCLINE MONOHYDRATE 100 MG PO CAPS: 100.0000 mg | ORAL_CAPSULE | Freq: Two times a day (BID) | ORAL | 0 refills | Status: AC

## 2020-05-12 MED ORDER — METHOCARBAMOL 500 MG PO TABS: 500.0000 mg | ORAL_TABLET | Freq: Four times a day (QID) | ORAL | 0 refills | Status: DC | PRN
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
## 2020-05-12 NOTE — Discharge Instructions (Signed)
HOME CARE INSTRUCTIONS  Remove items at home which could result in a fall. This includes throw rugs or furniture in walking pathways.   ICE to the affected hip every three hours for 30 minutes at a time and then as needed for pain and swelling.  Continue to use ice on the hip for pain and swelling from surgery. You may notice swelling that will progress down to the foot and ankle.  This is normal after surgery.  Elevate the leg when you are not up walking on it.   Do not place pillow under knee, focus on keeping the knee straight while resting  DIET You may resume your previous home diet once your are discharged from the hospital.  DRESSING / WOUND CARE / SHOWERING Please remove provena negative pressure dressing on 05/19/2020 and apply honey comb dressing. Keep dressing clean and dry at all times.   ACTIVITY Walk with your walker as instructed. Use walker as long as suggested by your caregivers. Avoid periods of inactivity such as sitting longer than an hour when not asleep. This helps prevent blood clots.  You may resume a sexual relationship in one month or when given the OK by your doctor.  You may return to work once you are cleared by your doctor.  Do not drive a car for 6 weeks or until released by you surgeon.  Do not drive while taking narcotics.  WEIGHT BEARING Weight bearing as tolerated. Use walker/cane as needed for at least 4 weeks post op.  POSTOPERATIVE CONSTIPATION PROTOCOL Constipation - defined medically as fewer than three stools per week and severe constipation as less than one stool per week.  One of the most common issues patients have following surgery is constipation.  Even if you have a regular bowel pattern at home, your normal regimen is likely to be disrupted due to multiple reasons following surgery.  Combination of anesthesia, postoperative narcotics, change in appetite and fluid intake all can affect your bowels.  In order to avoid complications  following surgery, here are some recommendations in order to help you during your recovery period.  Colace (docusate) - Pick up an over-the-counter form of Colace or another stool softener and take twice a day as long as you are requiring postoperative pain medications.  Take with a full glass of water daily.  If you experience loose stools or diarrhea, hold the colace until you stool forms back up.  If your symptoms do not get better within 1 week or if they get worse, check with your doctor.  Dulcolax (bisacodyl) - Pick up over-the-counter and take as directed by the product packaging as needed to assist with the movement of your bowels.  Take with a full glass of water.  Use this product as needed if not relieved by Colace only.   MiraLax (polyethylene glycol) - Pick up over-the-counter to have on hand.  MiraLax is a solution that will increase the amount of water in your bowels to assist with bowel movements.  Take as directed and can mix with a glass of water, juice, soda, coffee, or tea.  Take if you go more than two days without a movement. Do not use MiraLax more than once per day. Call your doctor if you are still constipated or irregular after using this medication for 7 days in a row.  If you continue to have problems with postoperative constipation, please contact the office for further assistance and recommendations.  If you experience "the worst abdominal  pain ever" or develop nausea or vomiting, please contact the office immediatly for further recommendations for treatment.  ITCHING  If you experience itching with your medications, try taking only a single pain pill, or even half a pain pill at a time.  You can also use Benadryl over the counter for itching or also to help with sleep.   TED HOSE STOCKINGS Wear the elastic stockings on both legs for six weeks following surgery during the day but you may remove then at night for sleeping.  MEDICATIONS See your medication summary on the  "After Visit Summary" that the nursing staff will review with you prior to discharge.  You may have some home medications which will be placed on hold until you complete the course of blood thinner medication.  It is important for you to complete the blood thinner medication as prescribed by your surgeon.  Continue your approved medications as instructed at time of discharge.  PRECAUTIONS If you experience chest pain or shortness of breath - call 911 immediately for transfer to the hospital emergency department.  If you develop a fever greater that 101 F, purulent drainage from wound, increased redness or drainage from wound, foul odor from the wound/dressing, or calf pain - CONTACT YOUR SURGEON.                                                   FOLLOW-UP APPOINTMENTS Make sure you keep all of your appointments after your operation with your surgeon and caregivers. You should call the office at the above phone number and make an appointment for approximately two weeks after the date of your surgery or on the date instructed by your surgeon outlined in the "After Visit Summary".  RANGE OF MOTION AND STRENGTHENING EXERCISES  These exercises are designed to help you keep full movement of your hip joint. Follow your caregiver's or physical therapist's instructions. Perform all exercises about fifteen times, three times per day or as directed. Exercise both hips, even if you have had only one joint replacement. These exercises can be done on a training (exercise) mat, on the floor, on a table or on a bed. Use whatever works the best and is most comfortable for you. Use music or television while you are exercising so that the exercises are a pleasant break in your day. This will make your life better with the exercises acting as a break in routine you can look forward to.  Lying on your back, slowly slide your foot toward your buttocks, raising your knee up off the floor. Then slowly slide your foot back down  until your leg is straight again.  Lying on your back spread your legs as far apart as you can without causing discomfort.  Lying on your side, raise your upper leg and foot straight up from the floor as far as is comfortable. Slowly lower the leg and repeat.  Lying on your back, tighten up the muscle in the front of your thigh (quadriceps muscles). You can do this by keeping your leg straight and trying to raise your heel off the floor. This helps strengthen the largest muscle supporting your knee.  Lying on your back, tighten up the muscles of your buttocks both with the legs straight and with the knee bent at a comfortable angle while keeping your heel on the floor.  IF YOU ARE TRANSFERRED TO A SKILLED REHAB FACILITY If the patient is transferred to a skilled rehab facility following release from the hospital, a list of the current medications will be sent to the facility for the patient to continue.  When discharged from the skilled rehab facility, please have the facility set up the patient's [place] prior to being released. Also, the skilled facility will be responsible for providing the patient with their medications at time of release from the facility to include their pain medication, the muscle relaxants, and their blood thinner medication. If the patient is still at the rehab facility at time of the two week follow up appointment, the skilled rehab facility will also need to assist the patient in arranging follow up appointment in our office and any transportation needs.  MAKE SURE YOU:  Understand these instructions.  Get help right away if you are not doing well or get worse.    Pick up stool softner and laxative for home use following surgery while on pain medications. Continue to use ice for pain and swelling after surgery. Do not use any lotions or creams on the incision until instructed by your surgeon.

## 2020-05-12 NOTE — TOC Transition Note (Signed)
Transition of Care [facility]) - CM/SW Discharge Note   Patient Details  Name: Kathleen Everett MRN: [id number] Date of Birth: [birth year]  Transition of Care [facility]) CM/SW Contact:  Elease Hashimoto, LCSW Phone Number: 05/12/2020, 1:36 PM   Clinical Narrative:   Pt has been through this before and has all equipment. She would like for [facility] to follow again. Her Mom is here at the bedside and she can help if needed. Pt aware of discharge order and is waiting for PT to come and walk with her. No other needs. DC today.      Barriers to Discharge: No Barriers Identified   Patient Goals and CMS Choice Patient states their goals for this hospitalization and ongoing recovery are:: I havn't been up yet, waiting for PT CMS Medicare.gov Compare Post Acute Care list provided to:: Patient Choice offered to / list presented to : Patient  Discharge Placement                       Discharge Plan and Services In-house Referral: Clinical Social Work   Post Acute Care Choice: Home Health                    HH Arranged: PT [facility] Agency: [facility] (now [facility]) Date [place]: 05/12/20 Time [place]: 1335 Representative spoke with at [place]: [place] ([place]) Interventions     Readmission Risk Interventions No flowsheet data found.

## 2020-05-12 NOTE — Progress Notes (Addendum)
   Subjective: 1 Day Post-Op Procedure(s) (LRB): IRRIGATION AND DEBRIDEMENT HIP (Right) Patient reports pain as moderate.   Patient is well, and has had no acute complaints or problems Denies any CP, SOB, ABD pain. We will continue therapy today.  Plan is to go Home after hospital stay.  Objective: Vital signs in last 24 hours: Temp:  [97.3 F (36.3 C)-99.4 F (37.4 C)] 98.1 F (36.7 C) (06/25 0754) Pulse Rate:  [62-76] 67 (06/25 0754) Resp:  [6-21] 15 (06/25 0754) BP: (101-151)/(49-80) 108/49 (06/25 0754) SpO2:  [96 %-100 %] 96 % (06/25 0754) Weight:  [122 kg] 122 kg (06/24 1123)  Intake/Output from previous day: 06/24 0701 - 06/25 0700 In: 2881.2 [P.O.:1020; I.V.:1211.2; IV Piggyback:650] Out: 50 [Blood:50] Intake/Output this shift: No intake/output data recorded.  Recent Labs    05/11/20 1626  HGB 11.9*   Recent Labs    05/11/20 1626  WBC 8.7  RBC 4.21  HCT 36.4  PLT 276   Recent Labs    05/11/20 1626  CREATININE 0.90   No results for input(s): LABPT, INR in the last 72 hours.  EXAM General - Patient is Alert, Appropriate and Oriented Extremity - Neurovascular intact Sensation intact distally Intact pulses distally Dorsiflexion/Plantar flexion intact No cellulitis present Compartment soft Dressing - dressing C/D/I, prevena intact with out drainage Motor Function - intact, moving foot and toes well on exam.   Past Medical History:  Diagnosis Date  . Anxiety   . Back pain   . Chiari malformation    s/p sgy decompression  . Chronic tension headaches   . Complication of anesthesia   . DDD (degenerative disc disease), lumbar   . Depression   . Diverticulitis   . Dysmenorrhea   . Endometriosis   . Gas pain   . GERD (gastroesophageal reflux disease)   . Hidradenitis   . Hyperlipidemia   . Mitral valve prolapse   . Obesity   . Osteoarthritis   . Ovarian cyst    hx  . PONV (postoperative nausea and vomiting)   . Sleep apnea    central  sleep apnea  No CPAP right now  . TMJ (dislocation of temporomandibular joint)   . Vitamin B12 deficiency    On injections    Assessment/Plan:   1 Day Post-Op Procedure(s) (LRB): IRRIGATION AND DEBRIDEMENT HIP (Right) Active Problems:   Abscess of right hip  Estimated body mass index is 38.6 kg/m as calculated from the following:   Height as of this encounter: 5\' 10"  (1.778 m).   Weight as of this encounter: 122 kg. Advance diet  Continue with IV abx Cultures pending  Pain well controlled VSS Plan on discharge home today with po abx   DVT Prophylaxis - Lovenox, TED hose and SCDs Weight-Bearing as tolerated to right leg   T. [name], PA-C [place] 05/12/2020, 7:58 AM

## 2020-05-13 DIAGNOSIS — F411 Generalized anxiety disorder: Secondary | ICD-10-CM | POA: Diagnosis not present

## 2020-05-13 DIAGNOSIS — K2950 Unspecified chronic gastritis without bleeding: Secondary | ICD-10-CM | POA: Diagnosis not present

## 2020-05-13 DIAGNOSIS — T8451XA Infection and inflammatory reaction due to internal right hip prosthesis, initial encounter: Secondary | ICD-10-CM | POA: Diagnosis not present

## 2020-05-13 DIAGNOSIS — L02415 Cutaneous abscess of right lower limb: Secondary | ICD-10-CM | POA: Diagnosis not present

## 2020-05-13 DIAGNOSIS — E785 Hyperlipidemia, unspecified: Secondary | ICD-10-CM | POA: Diagnosis not present

## 2020-05-13 DIAGNOSIS — K219 Gastro-esophageal reflux disease without esophagitis: Secondary | ICD-10-CM | POA: Diagnosis not present

## 2020-05-13 DIAGNOSIS — G8929 Other chronic pain: Secondary | ICD-10-CM | POA: Diagnosis not present

## 2020-05-13 DIAGNOSIS — F332 Major depressive disorder, recurrent severe without psychotic features: Secondary | ICD-10-CM | POA: Diagnosis not present

## 2020-05-13 DIAGNOSIS — M5136 Other intervertebral disc degeneration, lumbar region: Secondary | ICD-10-CM | POA: Diagnosis not present

## 2020-05-14 ENCOUNTER — Encounter: Payer: Self-pay | Admitting: Internal Medicine

## 2020-05-14 NOTE — Assessment & Plan Note (Signed)
On prozac.  Seeing psychiatry.   

## 2020-05-14 NOTE — Assessment & Plan Note (Signed)
On lipitor.  Low cholesterol diet and exercise.  Follow lipid panel and liver function tests.   

## 2020-05-14 NOTE — Assessment & Plan Note (Signed)
Followed by pain clinic.  

## 2020-05-14 NOTE — Assessment & Plan Note (Signed)
No sob with exertion.  Occasional feeling of not being able to get a good breath in.  Some constipation.  Miralax.  Check cxr.  Treat acid.

## 2020-05-14 NOTE — Assessment & Plan Note (Addendum)
Acid reflux.  On nexium.  Add pepcid.  Follow.

## 2020-05-14 NOTE — Assessment & Plan Note (Signed)
Follow cbc.  

## 2020-05-14 NOTE — Assessment & Plan Note (Signed)
Low carb diet and exercise.  Follow met b and a1c.  

## 2020-05-14 NOTE — Assessment & Plan Note (Signed)
Seeing ortho.  Increased pain recently.  Planning for aspiration.

## 2020-05-15 ENCOUNTER — Other Ambulatory Visit: Payer: Medicaid Other

## 2020-05-15 NOTE — Anesthesia Postprocedure Evaluation (Signed)
Anesthesia Post Note  Patient: Kathleen Everett  Procedure(s) Performed: IRRIGATION AND DEBRIDEMENT HIP (Right Hip)  Patient location during evaluation: PACU Anesthesia Type: General Level of consciousness: awake and alert Pain management: pain level controlled Vital Signs Assessment: post-procedure vital signs reviewed and stable Respiratory status: spontaneous breathing, nonlabored ventilation, respiratory function stable and patient connected to nasal cannula oxygen Cardiovascular status: blood pressure returned to baseline and stable Postop Assessment: no apparent nausea or vomiting Anesthetic complications: no   No complications documented.   Last Vitals:  Vitals:   05/12/20 0754 05/12/20 1133  BP: (!) 108/49 113/70  Pulse: 67 64  Resp: 15 17  Temp: 36.7 C 37.1 C  SpO2: 96% 96%    Last Pain:  Vitals:   05/12/20 1408  TempSrc:   PainSc: 6                  Martha Clan

## 2020-05-16 DIAGNOSIS — F411 Generalized anxiety disorder: Secondary | ICD-10-CM | POA: Diagnosis not present

## 2020-05-16 DIAGNOSIS — M5136 Other intervertebral disc degeneration, lumbar region: Secondary | ICD-10-CM | POA: Diagnosis not present

## 2020-05-16 DIAGNOSIS — E785 Hyperlipidemia, unspecified: Secondary | ICD-10-CM | POA: Diagnosis not present

## 2020-05-16 DIAGNOSIS — K2950 Unspecified chronic gastritis without bleeding: Secondary | ICD-10-CM | POA: Diagnosis not present

## 2020-05-16 DIAGNOSIS — K219 Gastro-esophageal reflux disease without esophagitis: Secondary | ICD-10-CM | POA: Diagnosis not present

## 2020-05-16 DIAGNOSIS — T8451XA Infection and inflammatory reaction due to internal right hip prosthesis, initial encounter: Secondary | ICD-10-CM | POA: Diagnosis not present

## 2020-05-16 DIAGNOSIS — G8929 Other chronic pain: Secondary | ICD-10-CM | POA: Diagnosis not present

## 2020-05-16 DIAGNOSIS — L02415 Cutaneous abscess of right lower limb: Secondary | ICD-10-CM | POA: Diagnosis not present

## 2020-05-16 DIAGNOSIS — F332 Major depressive disorder, recurrent severe without psychotic features: Secondary | ICD-10-CM | POA: Diagnosis not present

## 2020-05-16 LAB — AEROBIC/ANAEROBIC CULTURE W GRAM STAIN (SURGICAL/DEEP WOUND)

## 2020-05-16 LAB — ACID FAST SMEAR (AFB, MYCOBACTERIA): Acid Fast Smear: NEGATIVE

## 2020-05-17 DIAGNOSIS — K2950 Unspecified chronic gastritis without bleeding: Secondary | ICD-10-CM | POA: Diagnosis not present

## 2020-05-17 DIAGNOSIS — F411 Generalized anxiety disorder: Secondary | ICD-10-CM | POA: Diagnosis not present

## 2020-05-17 DIAGNOSIS — G8929 Other chronic pain: Secondary | ICD-10-CM | POA: Diagnosis not present

## 2020-05-17 DIAGNOSIS — K219 Gastro-esophageal reflux disease without esophagitis: Secondary | ICD-10-CM | POA: Diagnosis not present

## 2020-05-17 DIAGNOSIS — M5136 Other intervertebral disc degeneration, lumbar region: Secondary | ICD-10-CM | POA: Diagnosis not present

## 2020-05-17 DIAGNOSIS — L02415 Cutaneous abscess of right lower limb: Secondary | ICD-10-CM | POA: Diagnosis not present

## 2020-05-17 DIAGNOSIS — T8451XA Infection and inflammatory reaction due to internal right hip prosthesis, initial encounter: Secondary | ICD-10-CM | POA: Diagnosis not present

## 2020-05-17 DIAGNOSIS — F332 Major depressive disorder, recurrent severe without psychotic features: Secondary | ICD-10-CM | POA: Diagnosis not present

## 2020-05-17 DIAGNOSIS — E785 Hyperlipidemia, unspecified: Secondary | ICD-10-CM | POA: Diagnosis not present

## 2020-05-18 ENCOUNTER — Other Ambulatory Visit: Payer: Self-pay

## 2020-05-18 ENCOUNTER — Telehealth (HOSPITAL_COMMUNITY): Payer: Medicaid Other | Admitting: Psychiatry

## 2020-05-18 DIAGNOSIS — F331 Major depressive disorder, recurrent, moderate: Secondary | ICD-10-CM

## 2020-05-18 DIAGNOSIS — F411 Generalized anxiety disorder: Secondary | ICD-10-CM

## 2020-05-18 MED ORDER — FLUOXETINE HCL 40 MG PO CAPS: 40.0000 mg | ORAL_CAPSULE | Freq: Every day | ORAL | 1 refills | Status: DC

## 2020-05-18 NOTE — Progress Notes (Signed)
Virtual Visit via Telephone Note  I connected with Kathleen Everett on 05/18/20 at  9:45 AM EDT by telephone and verified that I am speaking with the correct person using two identifiers.  Location: Patient: home Provider: office   I discussed the limitations, risks, security and privacy concerns of performing an evaluation and management service by telephone and the availability of in person appointments. I also discussed with the patient that there may be a patient responsible charge related to this service. The patient expressed understanding and agreed to proceed.   History of Present Illness: Kathleen Everett had unexpected hip surgery last week due to infection. She is still recovering. Kathleen Everett is not able to go out and do anything due to the recent surgery. She wants to enjoy but instead feels frustrated. Her depression and anxiety ongoing and the pain does affect it. She cries at least twice a day. She feels guilty and upset that she has yet another stressor. Her sleep is poor due to recent surgery. Kathleen Everett denies SI/HI. She does think Prozac has made a small difference.   Observations/Objective:  General Appearance: unable to assess  Eye Contact:  unable to assess  Speech:  Clear and Coherent and Normal Rate  Volume:  Normal  Mood:  Anxious and Depressed  Affect:  Congruent  Thought Process:  Goal Directed, Linear, and Descriptions of Associations: Intact  Orientation:  Full (Time, Place, and Person)  Thought Content:  Logical  Suicidal Thoughts:  No  Homicidal Thoughts:  No  Memory:  Immediate;   Good  Judgement:  Good  Insight:  Good  Psychomotor Activity: unable to assess  Concentration:  Concentration: Good  Recall:  Good  Fund of Knowledge:  Good  Language:  Good  Akathisia:  unable to assess  Handed:  Right  AIMS (if indicated):     Assets:  Communication Skills Desire for Improvement Financial  Resources/Insurance Housing Talents/Skills Transportation Vocational/Educational  ADL's:  unable to assess  Cognition:  WNL  Sleep:         Assessment and Plan:  MDD- recurrent, moderate; GAD  Increase Prozac 40mg  po qD- she reports no SE or AR and has been taking Prozac for over 6 weeks.  Follow Up Instructions: In 2 months or sooner if needed   I discussed the assessment and treatment plan with the patient. The patient was provided an opportunity to ask questions and all were answered. The patient agreed with the plan and demonstrated an understanding of the instructions.   The patient was advised to call back or seek an in-person evaluation if the symptoms worsen or if the condition fails to improve as anticipated.  I provided 15 minutes of non-face-to-face time during this encounter.   Oletta Darter, MD

## 2020-05-19 DIAGNOSIS — K2950 Unspecified chronic gastritis without bleeding: Secondary | ICD-10-CM | POA: Diagnosis not present

## 2020-05-19 DIAGNOSIS — M5136 Other intervertebral disc degeneration, lumbar region: Secondary | ICD-10-CM | POA: Diagnosis not present

## 2020-05-19 DIAGNOSIS — T8451XA Infection and inflammatory reaction due to internal right hip prosthesis, initial encounter: Secondary | ICD-10-CM | POA: Diagnosis not present

## 2020-05-19 DIAGNOSIS — K219 Gastro-esophageal reflux disease without esophagitis: Secondary | ICD-10-CM | POA: Diagnosis not present

## 2020-05-19 DIAGNOSIS — L02415 Cutaneous abscess of right lower limb: Secondary | ICD-10-CM | POA: Diagnosis not present

## 2020-05-19 DIAGNOSIS — E785 Hyperlipidemia, unspecified: Secondary | ICD-10-CM | POA: Diagnosis not present

## 2020-05-19 DIAGNOSIS — F332 Major depressive disorder, recurrent severe without psychotic features: Secondary | ICD-10-CM | POA: Diagnosis not present

## 2020-05-19 DIAGNOSIS — G8929 Other chronic pain: Secondary | ICD-10-CM | POA: Diagnosis not present

## 2020-05-19 DIAGNOSIS — F411 Generalized anxiety disorder: Secondary | ICD-10-CM | POA: Diagnosis not present

## 2020-05-22 DIAGNOSIS — G8929 Other chronic pain: Secondary | ICD-10-CM | POA: Diagnosis not present

## 2020-05-22 DIAGNOSIS — E785 Hyperlipidemia, unspecified: Secondary | ICD-10-CM | POA: Diagnosis not present

## 2020-05-22 DIAGNOSIS — K2950 Unspecified chronic gastritis without bleeding: Secondary | ICD-10-CM | POA: Diagnosis not present

## 2020-05-22 DIAGNOSIS — L02415 Cutaneous abscess of right lower limb: Secondary | ICD-10-CM | POA: Diagnosis not present

## 2020-05-22 DIAGNOSIS — T8451XA Infection and inflammatory reaction due to internal right hip prosthesis, initial encounter: Secondary | ICD-10-CM | POA: Diagnosis not present

## 2020-05-22 DIAGNOSIS — K219 Gastro-esophageal reflux disease without esophagitis: Secondary | ICD-10-CM | POA: Diagnosis not present

## 2020-05-22 DIAGNOSIS — M5136 Other intervertebral disc degeneration, lumbar region: Secondary | ICD-10-CM | POA: Diagnosis not present

## 2020-05-22 DIAGNOSIS — F411 Generalized anxiety disorder: Secondary | ICD-10-CM | POA: Diagnosis not present

## 2020-05-22 DIAGNOSIS — F332 Major depressive disorder, recurrent severe without psychotic features: Secondary | ICD-10-CM | POA: Diagnosis not present

## 2020-05-24 DIAGNOSIS — L02415 Cutaneous abscess of right lower limb: Secondary | ICD-10-CM | POA: Diagnosis not present

## 2020-05-24 DIAGNOSIS — M5136 Other intervertebral disc degeneration, lumbar region: Secondary | ICD-10-CM | POA: Diagnosis not present

## 2020-05-24 DIAGNOSIS — E785 Hyperlipidemia, unspecified: Secondary | ICD-10-CM | POA: Diagnosis not present

## 2020-05-24 DIAGNOSIS — G8929 Other chronic pain: Secondary | ICD-10-CM | POA: Diagnosis not present

## 2020-05-24 DIAGNOSIS — T8451XA Infection and inflammatory reaction due to internal right hip prosthesis, initial encounter: Secondary | ICD-10-CM | POA: Diagnosis not present

## 2020-05-24 DIAGNOSIS — F411 Generalized anxiety disorder: Secondary | ICD-10-CM | POA: Diagnosis not present

## 2020-05-24 DIAGNOSIS — F332 Major depressive disorder, recurrent severe without psychotic features: Secondary | ICD-10-CM | POA: Diagnosis not present

## 2020-05-24 DIAGNOSIS — K2950 Unspecified chronic gastritis without bleeding: Secondary | ICD-10-CM | POA: Diagnosis not present

## 2020-05-24 DIAGNOSIS — K219 Gastro-esophageal reflux disease without esophagitis: Secondary | ICD-10-CM | POA: Diagnosis not present

## 2020-05-25 ENCOUNTER — Encounter: Payer: Self-pay | Admitting: Internal Medicine

## 2020-05-25 ENCOUNTER — Other Ambulatory Visit: Payer: Self-pay | Admitting: Internal Medicine

## 2020-05-25 MED ORDER — FLUCONAZOLE 150 MG PO TABS: ORAL_TABLET | ORAL | 0 refills | Status: DC

## 2020-05-25 NOTE — Telephone Encounter (Signed)
rx sent in for diflucan  

## 2020-06-09 DIAGNOSIS — R3914 Feeling of incomplete bladder emptying: Secondary | ICD-10-CM | POA: Diagnosis not present

## 2020-06-09 DIAGNOSIS — R350 Frequency of micturition: Secondary | ICD-10-CM | POA: Diagnosis not present

## 2020-06-09 DIAGNOSIS — R3912 Poor urinary stream: Secondary | ICD-10-CM | POA: Diagnosis not present

## 2020-06-09 LAB — FUNGUS CULTURE WITH STAIN

## 2020-06-09 LAB — FUNGAL ORGANISM REFLEX

## 2020-06-09 LAB — FUNGUS CULTURE RESULT

## 2020-06-23 ENCOUNTER — Other Ambulatory Visit: Payer: Medicaid Other

## 2020-06-26 ENCOUNTER — Ambulatory Visit (INDEPENDENT_AMBULATORY_CARE_PROVIDER_SITE_OTHER): Payer: Medicare HMO | Admitting: Internal Medicine

## 2020-06-26 ENCOUNTER — Other Ambulatory Visit: Payer: Self-pay

## 2020-06-26 ENCOUNTER — Encounter: Payer: Self-pay | Admitting: Internal Medicine

## 2020-06-26 VITALS — BP 120/82 | HR 72 | Temp 98.2°F | Resp 15 | Ht 70.0 in | Wt 279.0 lb

## 2020-06-26 DIAGNOSIS — E7800 Pure hypercholesterolemia, unspecified: Secondary | ICD-10-CM

## 2020-06-26 DIAGNOSIS — R0602 Shortness of breath: Secondary | ICD-10-CM | POA: Diagnosis not present

## 2020-06-26 DIAGNOSIS — F439 Reaction to severe stress, unspecified: Secondary | ICD-10-CM

## 2020-06-26 DIAGNOSIS — D649 Anemia, unspecified: Secondary | ICD-10-CM | POA: Diagnosis not present

## 2020-06-26 DIAGNOSIS — R739 Hyperglycemia, unspecified: Secondary | ICD-10-CM | POA: Diagnosis not present

## 2020-06-26 DIAGNOSIS — K219 Gastro-esophageal reflux disease without esophagitis: Secondary | ICD-10-CM

## 2020-06-26 DIAGNOSIS — Z96641 Presence of right artificial hip joint: Secondary | ICD-10-CM | POA: Diagnosis not present

## 2020-06-26 DIAGNOSIS — M25551 Pain in right hip: Secondary | ICD-10-CM | POA: Diagnosis not present

## 2020-06-26 DIAGNOSIS — L732 Hidradenitis suppurativa: Secondary | ICD-10-CM

## 2020-06-26 DIAGNOSIS — R519 Headache, unspecified: Secondary | ICD-10-CM

## 2020-06-26 DIAGNOSIS — G4733 Obstructive sleep apnea (adult) (pediatric): Secondary | ICD-10-CM | POA: Diagnosis not present

## 2020-06-26 DIAGNOSIS — M545 Low back pain, unspecified: Secondary | ICD-10-CM

## 2020-06-26 DIAGNOSIS — F320 Major depressive disorder, single episode, mild: Secondary | ICD-10-CM

## 2020-06-26 DIAGNOSIS — Z0000 Encounter for general adult medical examination without abnormal findings: Secondary | ICD-10-CM

## 2020-06-26 DIAGNOSIS — F32A Depression, unspecified: Secondary | ICD-10-CM

## 2020-06-26 DIAGNOSIS — Q0700 Arnold-Chiari syndrome without spina bifida or hydrocephalus: Secondary | ICD-10-CM

## 2020-06-26 DIAGNOSIS — G8929 Other chronic pain: Secondary | ICD-10-CM

## 2020-06-26 LAB — IBC + FERRITIN
Ferritin: 46.6 ng/mL (ref 10.0–291.0)
Iron: 58 ug/dL (ref 42–145)
Saturation Ratios: 15.1 % — ABNORMAL LOW (ref 20.0–50.0)
Transferrin: 274 mg/dL (ref 212.0–360.0)

## 2020-06-26 LAB — CBC WITH DIFFERENTIAL/PLATELET
Basophils Absolute: 0 10*3/uL (ref 0.0–0.1)
Basophils Relative: 0.5 % (ref 0.0–3.0)
Eosinophils Absolute: 0.2 10*3/uL (ref 0.0–0.7)
Eosinophils Relative: 3 % (ref 0.0–5.0)
HCT: 38.5 % (ref 36.0–46.0)
Hemoglobin: 12.7 g/dL (ref 12.0–15.0)
Lymphocytes Relative: 30.5 % (ref 12.0–46.0)
Lymphs Abs: 1.7 10*3/uL (ref 0.7–4.0)
MCHC: 33 g/dL (ref 30.0–36.0)
MCV: 86.2 fl (ref 78.0–100.0)
Monocytes Absolute: 0.4 10*3/uL (ref 0.1–1.0)
Monocytes Relative: 6.5 % (ref 3.0–12.0)
Neutro Abs: 3.4 10*3/uL (ref 1.4–7.7)
Neutrophils Relative %: 59.5 % (ref 43.0–77.0)
Platelets: 248 10*3/uL (ref 150.0–400.0)
RBC: 4.47 Mil/uL (ref 3.87–5.11)
RDW: 14.3 % (ref 11.5–15.5)
WBC: 5.7 10*3/uL (ref 4.0–10.5)

## 2020-06-26 LAB — TSH: TSH: 3.44 u[IU]/mL (ref 0.35–4.50)

## 2020-06-26 LAB — HEPATIC FUNCTION PANEL
ALT: 28 U/L (ref 0–35)
AST: 22 U/L (ref 0–37)
Albumin: 4.3 g/dL (ref 3.5–5.2)
Alkaline Phosphatase: 110 U/L (ref 39–117)
Bilirubin, Direct: 0.1 mg/dL (ref 0.0–0.3)
Total Bilirubin: 0.3 mg/dL (ref 0.2–1.2)
Total Protein: 6.9 g/dL (ref 6.0–8.3)

## 2020-06-26 LAB — BASIC METABOLIC PANEL
BUN: 13 mg/dL (ref 6–23)
CO2: 29 mEq/L (ref 19–32)
Calcium: 9.5 mg/dL (ref 8.4–10.5)
Chloride: 100 mEq/L (ref 96–112)
Creatinine, Ser: 0.95 mg/dL (ref 0.40–1.20)
GFR: 63.05 mL/min (ref >60.00)
Glucose, Bld: 89 mg/dL (ref 70–99)
Potassium: 4.4 mEq/L (ref 3.5–5.1)
Sodium: 139 mEq/L (ref 135–145)

## 2020-06-26 LAB — LIPID PANEL
Cholesterol: 293 mg/dL — ABNORMAL HIGH (ref 0–200)
HDL: 46.8 mg/dL (ref >39.00)
NonHDL: 245.79
Total CHOL/HDL Ratio: 6
Triglycerides: 325 mg/dL — ABNORMAL HIGH (ref 0.0–149.0)
VLDL: 65 mg/dL — ABNORMAL HIGH (ref 0.0–40.0)

## 2020-06-26 LAB — HEMOGLOBIN A1C: Hgb A1c MFr Bld: 5.5 % (ref 4.6–6.5)

## 2020-06-26 LAB — LDL CHOLESTEROL, DIRECT: Direct LDL: 181 mg/dL

## 2020-06-26 MED ORDER — TRIAMCINOLONE ACETONIDE 0.1 % EX CREA
1.0000 | TOPICAL_CREAM | Freq: Two times a day (BID) | CUTANEOUS | 0 refills | Status: DC
Start: 2020-06-26 — End: 2020-07-27

## 2020-06-26 MED ORDER — MUPIROCIN 2 % EX OINT: TOPICAL_OINTMENT | CUTANEOUS | 0 refills | Status: DC

## 2020-06-26 NOTE — Assessment & Plan Note (Signed)
Physical today 06/26/20.  Mammogram 03/08/20 - Birads I.  Colonoscopy 04/2015.  S/p hysterectomy.  (supracervical hysterectomy) - PAP 06/25/19 - negative with negative HPV.

## 2020-06-26 NOTE — Progress Notes (Signed)
Patient ID: Kathleen Everett, female   DOB: 12-Oct-1971, 49 y.o.   MRN: [id number]   Subjective:    Patient ID: Kathleen Everett, female    DOB: Aug 25, 1971, 49 y.o.   MRN: [id number]  HPI This visit occurred during the SARS-CoV-2 public health emergency.  Safety protocols were in place, including screening questions prior to the visit, additional usage of staff PPE, and extensive cleaning of exam room while observing appropriate contact time as indicated for disinfecting solutions.  Patient here for her physical exam. She is s/p total replacement right hip.  Recently underwent debridement with irrigation - right hip.  Treated with abx.  Being followed by ortho.  Previous hip pain is better. Still with back pain (right low back and right hip pain) and pain - suprascapular region.  Still with some intermittent dizziness.  States may have flares 2x/week.  Feels this may be related to the chiari malformation.  No chest pain.  Does have lesion - left axilla.  Discussed using bactroban.  Also reported some sob.  No increased cough.  Persistent sob.  Requested referral to pulmonary for further evaluation.  Recent cxr revealed no acute cardiopulmonary disease.  Discussed may be multifactorial.  Control acid reflux.  May be deconditioning.  Given persistence, request pulmonary consultation.  No increased abdominal pain.  Bowels moving.     Past Medical History:  Diagnosis Date  . Anxiety   . Back pain   . Chiari malformation    s/p sgy decompression  . Chronic tension headaches   . Complication of anesthesia   . DDD (degenerative disc disease), lumbar   . Depression   . Diverticulitis   . Dysmenorrhea   . Endometriosis   . Gas pain   . GERD (gastroesophageal reflux disease)   . Hidradenitis   . Hyperlipidemia   . Mitral valve prolapse   . Obesity   . Osteoarthritis   . Ovarian cyst    hx  . PONV (postoperative nausea and vomiting)   . Sleep apnea    central sleep apnea  No CPAP right  now  . TMJ (dislocation of temporomandibular joint)   . Vitamin B12 deficiency    On injections   Past Surgical History:  Procedure Laterality Date  . ABDOMINAL HYSTERECTOMY  03/27/07   laparoscopic  . BREAST EXCISIONAL BIOPSY Left 2004   fibroadenoma  . CESAREAN SECTION     1999/2004 twins  . chiari decompression    . COLONOSCOPY WITH PROPOFOL N/A 04/28/2015   Procedure: COLONOSCOPY WITH PROPOFOL;  Surgeon: Manya Silvas, MD;  Location: [facility] ENDOSCOPY;  Service: Endoscopy;  Laterality: N/A;  . CYSTOSCOPY     bladder ulcers  . ESOPHAGOGASTRODUODENOSCOPY N/A 04/28/2015   Procedure: ESOPHAGOGASTRODUODENOSCOPY (EGD);  Surgeon: Manya Silvas, MD;  Location: [facility] ENDOSCOPY;  Service: Endoscopy;  Laterality: N/A;  . HERNIA REPAIR Right 2003   inguinal  . INCISION AND DRAINAGE HIP Right 05/11/2020   Procedure: IRRIGATION AND DEBRIDEMENT HIP;  Surgeon: Hessie Knows, MD;  Location: [facility] ORS;  Service: Orthopedics;  Laterality: Right;  . OVARIAN CYST REMOVAL  2001   right  . TEMPOROMANDIBULAR JOINT SURGERY  2000  . TOTAL HIP ARTHROPLASTY Right 12/28/2019   Procedure: TOTAL HIP ARTHROPLASTY ANTERIOR APPROACH;  Surgeon: Hessie Knows, MD;  Location: [facility] ORS;  Service: Orthopedics;  Laterality: Right;  . TUBAL LIGATION     Bilateral   Family History  Problem Relation Age of Onset  . Hyperlipidemia Mother   . Anxiety  disorder Mother   . Atrial fibrillation Mother   . Hypertension Mother   . Anxiety disorder Father   . Stroke Father   . Hyperlipidemia Father   . Hypertension Father   . Congestive Heart Failure Father   . Depression Father   . Hyperlipidemia Brother   . Colon cancer Paternal Aunt   . Dementia Paternal Aunt   . Dementia Maternal Grandmother   . Dementia Paternal Grandmother   . ADD / ADHD Daughter   . Breast cancer Neg Hx    Social History   Socioeconomic History  . Marital status: Married    Spouse name: Not on file  . Number of children: 3  . Years of  education: 27  . Highest education level: Not on file  Occupational History  . Not on file  Tobacco Use  . Smoking status: Never Smoker  . Smokeless tobacco: Never Used  Vaping Use  . Vaping Use: Never used  Substance and Sexual Activity  . Alcohol use: Yes    Alcohol/week: 0.0 standard drinks    Comment: rarely  . Drug use: No  . Sexual activity: Yes    Birth control/protection: None  Other Topics Concern  . Not on file  Social History Narrative   Patient drinks 2-3 cups of caffeine daily.   Patient is right handed.       Social Hx:   Current living situation- [place]. Living with husband and 3 kids   Born and Raised- [place]raised by both parents.    Siblings- 1 brother who is 7 yrs older than her   Legal issues- none   Married for 24 yrs. Currently unemployed since 2011 when she hurt her back. HS grad and CNA and phlebotomy.    Social Determinants of Health   Financial Resource Strain: Low Risk   . Difficulty of Paying Living Expenses: Not very hard  Food Insecurity: No Food Insecurity  . Worried About Charity fundraiser in the Last Year: Never true  . Ran Out of Food in the Last Year: Never true  Transportation Needs: No Transportation Needs  . Lack of Transportation (Medical): No  . Lack of Transportation (Non-Medical): No  Physical Activity: Insufficiently Active  . Days of Exercise per Week: 7 days  . Minutes of Exercise per Session: 10 min  Stress:   . Feeling of Stress :   Social Connections: Socially Integrated  . Frequency of Communication with Friends and Family: More than three times a week  . Frequency of Social Gatherings with Friends and Family: More than three times a week  . Attends Religious Services: More than 4 times per year  . Active Member of Clubs or Organizations: Not on file  . Attends Archivist Meetings: More than 4 times per year  . Marital Status: Married    Outpatient Encounter Medications as of 06/26/2020  Medication Sig  .  acetaminophen (TYLENOL) 500 MG tablet Take 500-1,000 mg by mouth 2 (two) times daily as needed for mild pain.   Marland Kitchen AIMOVIG 140 MG/ML SOAJ   . Ascorbic Acid (VITAMIN C) 500 MG CHEW Chew 500 mg by mouth daily.  Marland Kitchen atorvastatin (LIPITOR) 10 MG tablet Take 1 tablet (10 mg total) by mouth daily. (Patient taking differently: Take 10 mg by mouth at bedtime. )  . cyanocobalamin (,VITAMIN B-12,) 1000 MCG/ML injection INJECT ONE ML (CC) INTRAMUSCULARLY ONCE EVERY MONTH (Patient taking differently: Inject 1,000 mcg into the muscle every 30 (thirty) days. )  .  diazepam (VALIUM) 5 MG tablet Take 1 tablet (5 mg total) by mouth every 8 (eight) hours as needed for muscle spasms. (Patient taking differently: Take 5 mg by mouth daily as needed for muscle spasms. )  . diclofenac sodium (VOLTAREN) 1 % GEL Apply 1 application topically 4 (four) times daily as needed (pain.).   Marland Kitchen esomeprazole (NEXIUM) 40 MG capsule Take 1 capsule by mouth once daily  . estradiol (ESTRACE VAGINAL) 0.1 MG/GM vaginal cream One applicator q hs x 5 nights and then 2x/week. (Patient taking differently: Place 1 Applicatorful vaginally 2 (two) times a week. )  . FLUoxetine (PROZAC) 40 MG capsule Take 1 capsule (40 mg total) by mouth daily.  . Fremanezumab-vfrm 225 MG/1.5ML SOAJ Inject 225 mg into the skin.   Marland Kitchen gabapentin (NEURONTIN) 400 MG capsule Take 400 mg by mouth 3 (three) times daily.  Marland Kitchen ibuprofen (ADVIL) 800 MG tablet Take 800 mg by mouth 3 (three) times daily as needed (Pain).   Marland Kitchen lactase (RA DAIRY AID) 3000 units tablet Take 9,000 Units by mouth 3 (three) times daily as needed (with dairy consumption).   . methocarbamol (ROBAXIN) 500 MG tablet Take 1 tablet (500 mg total) by mouth every 6 (six) hours as needed for muscle spasms. (Patient taking differently: Take 500 mg by mouth 3 (three) times daily. )  . NARCAN 4 MG/0.1ML LIQD nasal spray kit Place 0.4 mg into the nose once.   . NON FORMULARY Take 2 capsules by mouth daily. Thrive  vitamins with shake and derma patch  . nystatin cream (MYCOSTATIN) APPLY TOPICALLY TWICE DAILY (Patient taking differently: Apply 1 application topically 2 (two) times daily as needed (heat rash). )  . Oxycodone HCl 10 MG TABS Take 10 mg by mouth 5 (five) times daily.   . rizatriptan (MAXALT) 10 MG tablet Take 10 mg by mouth as needed for migraine. May repeat in 2 hours if needed  . Simethicone (GAS RELIEF PO) Take 125 mg by mouth 2 (two) times daily as needed for flatulence. Anti gas  . tamsulosin (FLOMAX) 0.4 MG CAPS capsule   . TOPAMAX 25 MG tablet Take 25 mg by mouth daily as needed (Pain and headache).   . furosemide (LASIX) 20 MG tablet Take 1 tablet (20 mg total) by mouth daily as needed (As needed for leg swelling).  . mupirocin ointment (BACTROBAN) 2 % Apply to affected area under arm twice a day  . potassium chloride (KLOR-CON) 10 MEQ tablet Take 1 tablet (10 mEq total) by mouth daily as needed (As needed with Furosemide (Lasix)).  Marland Kitchen triamcinolone cream (KENALOG) 0.1 % Apply 1 application topically 2 (two) times daily. Apply to affected areas on fingers bid  . [DISCONTINUED] ciprofloxacin (CIPRO) 500 MG tablet Take 500 mg by mouth 2 (two) times daily. (Patient not taking: Reported on 06/26/2020)  . [DISCONTINUED] fluconazole (DIFLUCAN) 150 MG tablet Take one tablet x 1.  May repeat x 1 in 3 days if not resolved. (Patient not taking: Reported on 06/26/2020)  . [DISCONTINUED] HYDROcodone-acetaminophen (NORCO/VICODIN) 5-325 MG tablet Take 1-2 tablets by mouth every 6 (six) hours as needed for moderate pain (pain score 4-6). (Patient not taking: Reported on 06/26/2020)  . [DISCONTINUED] methocarbamol (ROBAXIN) 500 MG tablet Take 1 tablet (500 mg total) by mouth every 6 (six) hours as needed for muscle spasms. (Patient not taking: Reported on 06/26/2020)   No facility-administered encounter medications on file as of 06/26/2020.    Review of Systems  Constitutional: Negative for appetite change  and  unexpected weight change.  HENT: Negative for congestion and sinus pressure.   Eyes: Negative for pain and visual disturbance.  Respiratory: Positive for shortness of breath. Negative for cough and chest tightness.   Cardiovascular: Negative for chest pain, palpitations and leg swelling.  Gastrointestinal: Negative for abdominal pain, diarrhea, nausea and vomiting.  Genitourinary: Negative for difficulty urinating and dysuria.  Musculoskeletal: Positive for back pain.       Right low back, right hip and right leg pain.  Pain suprascapular region.    Skin: Negative for color change and rash.  Neurological: Positive for light-headedness and headaches. Negative for dizziness.  Hematological: Negative for adenopathy. Does not bruise/bleed easily.  Psychiatric/Behavioral: Negative for agitation and dysphoric mood.       Objective:    Physical Exam Vitals reviewed.  Constitutional:      General: She is not in acute distress.    Appearance: Normal appearance. She is well-developed.  HENT:     Right Ear: External ear normal.     Left Ear: External ear normal.  Eyes:     General: No scleral icterus.       Right eye: No discharge.        Left eye: No discharge.     Conjunctiva/sclera: Conjunctivae normal.  Neck:     Thyroid: No thyromegaly.  Cardiovascular:     Rate and Rhythm: Normal rate and regular rhythm.  Pulmonary:     Effort: No tachypnea, accessory muscle usage or respiratory distress.     Breath sounds: Normal breath sounds. No decreased breath sounds or wheezing.  Chest:     Breasts:        Right: No inverted nipple, mass, nipple discharge or tenderness (no axillary adenopathy).        Left: No inverted nipple, mass, nipple discharge or tenderness (no axilarry adenopathy).  Abdominal:     General: Bowel sounds are normal.     Palpations: Abdomen is soft.     Tenderness: There is no abdominal tenderness.  Musculoskeletal:        General: No swelling or tenderness.      Cervical back: Neck supple. No tenderness.  Lymphadenopathy:     Cervical: No cervical adenopathy.  Skin:    Findings: No erythema or rash.  Neurological:     Mental Status: She is alert and oriented to person, place, and time.  Psychiatric:        Mood and Affect: Mood normal.        Behavior: Behavior normal.     BP 120/82   Pulse 72   Temp 98.2 F (36.8 C) (Oral)   Resp 15   Ht 5' 10" (1.778 m)   Wt 279 lb (126.6 kg)   LMP 12/08/2006   SpO2 97%   BMI 40.03 kg/m  Wt Readings from Last 3 Encounters:  06/26/20 279 lb (126.6 kg)  05/11/20 269 lb (122 kg)  05/08/20 273 lb (123.8 kg)     Lab Results  Component Value Date   WBC 5.7 06/26/2020   HGB 12.7 06/26/2020   HCT 38.5 06/26/2020   PLT 248.0 06/26/2020   GLUCOSE 89 06/26/2020   CHOL 293 (H) 06/26/2020   TRIG 325.0 (H) 06/26/2020   HDL 46.80 06/26/2020   LDLDIRECT 181.0 06/26/2020   LDLCALC 230 (H) 02/22/2020   ALT 28 06/26/2020   AST 22 06/26/2020   NA 139 06/26/2020   K 4.4 06/26/2020   CL 100 06/26/2020   CREATININE  0.95 06/26/2020   BUN 13 06/26/2020   CO2 29 06/26/2020   TSH 3.44 06/26/2020   INR 1.1 12/03/2019   HGBA1C 5.5 06/26/2020    DG Outside Films Extremity  Result Date: 05/11/2020 This examination belongs to an outside facility and is stored here for comparison purposes only.  Contact the originating outside institution for any associated report or interpretation.      Assessment & Plan:   Problem List Items Addressed This Visit    Stress    On prozac.  Seeing psychiatry.        Status post total hip replacement, right    Seeing ortho.  S/p recent irrigation and treated with abx.        SOB (shortness of breath)    Feel multifactorial.  Discussed deconditioning.  Recent cxr with no acute abnormality.  Control acid reflux.  Refer to pulmonary for further evaluation.        Relevant Orders   Ambulatory referral to Pulmonology   Sleep apnea    CPAP.       Right hip pain     S/p recent irrigation.  Followed by ortho.        Mild depression (HCC)    On prozac.  Followed by psychiatry.        Hyperglycemia    Low carb diet and exercise.  Follow met b and a1c.       Hypercholesterolemia    On lipitor.  Low cholesterol diet and exercise.  Follow lipid panel and liver function tests.        Relevant Orders   TSH (Completed)   Hidradenitis    Lesion left axilla.  Bactroban.       Health care maintenance    Physical today 06/26/20.  Mammogram 03/08/20 - Birads I.  Colonoscopy 04/2015.  S/p hysterectomy.  (supracervical hysterectomy) - PAP 06/25/19 - negative with negative HPV.        Headache    Is followed by neurology - Dr Manuella Ghazi.  On topamax.  Some persistent headache with intermittent dizziness.  Needs f/u appt.        Relevant Medications   AIMOVIG 140 MG/ML SOAJ   GERD (gastroesophageal reflux disease)    PPI.  Control acid reflux to confirm not contributing to sob.        Chronic back pain    Followed by pain clinic.       Arnold-Chiari malformation (HCC)    Is s/p surgery.  Has seen neurology.  With intermittent dizziness as outlined.  Schedule f/u with neurology.       Anemia    Follow cbc.        Other Visit Diagnoses    Routine general medical examination at a health care facility    -  Primary       [name], MD

## 2020-06-27 DIAGNOSIS — M5441 Lumbago with sciatica, right side: Secondary | ICD-10-CM | POA: Diagnosis not present

## 2020-06-27 DIAGNOSIS — M47816 Spondylosis without myelopathy or radiculopathy, lumbar region: Secondary | ICD-10-CM | POA: Diagnosis not present

## 2020-06-27 DIAGNOSIS — Z79899 Other long term (current) drug therapy: Secondary | ICD-10-CM | POA: Diagnosis not present

## 2020-06-27 DIAGNOSIS — M5137 Other intervertebral disc degeneration, lumbosacral region: Secondary | ICD-10-CM | POA: Diagnosis not present

## 2020-06-27 DIAGNOSIS — M5442 Lumbago with sciatica, left side: Secondary | ICD-10-CM | POA: Diagnosis not present

## 2020-06-27 DIAGNOSIS — Z5181 Encounter for therapeutic drug level monitoring: Secondary | ICD-10-CM | POA: Diagnosis not present

## 2020-06-28 LAB — ACID FAST CULTURE WITH REFLEXED SENSITIVITIES (MYCOBACTERIA): Acid Fast Culture: NEGATIVE

## 2020-07-02 ENCOUNTER — Encounter: Payer: Self-pay | Admitting: Internal Medicine

## 2020-07-02 NOTE — Assessment & Plan Note (Signed)
On lipitor.  Low cholesterol diet and exercise.  Follow lipid panel and liver function tests.   

## 2020-07-02 NOTE — Assessment & Plan Note (Signed)
Is followed by neurology - Dr Manuella Ghazi.  On topamax.  Some persistent headache with intermittent dizziness.  Needs f/u appt.

## 2020-07-02 NOTE — Assessment & Plan Note (Signed)
PPI.  Control acid reflux to confirm not contributing to sob.

## 2020-07-02 NOTE — Assessment & Plan Note (Signed)
S/p recent irrigation.  Followed by ortho.

## 2020-07-02 NOTE — Assessment & Plan Note (Signed)
Follow cbc.  

## 2020-07-02 NOTE — Assessment & Plan Note (Signed)
CPAP.  

## 2020-07-02 NOTE — Assessment & Plan Note (Signed)
Lesion left axilla.  Bactroban.

## 2020-07-02 NOTE — Assessment & Plan Note (Signed)
Seeing ortho.  S/p recent irrigation and treated with abx.

## 2020-07-02 NOTE — Assessment & Plan Note (Signed)
On prozac.  Followed by psychiatry.

## 2020-07-02 NOTE — Assessment & Plan Note (Signed)
Followed by pain clinic.  

## 2020-07-02 NOTE — Assessment & Plan Note (Signed)
Low carb diet and exercise.  Follow met b and a1c.  

## 2020-07-02 NOTE — Assessment & Plan Note (Signed)
On prozac.  Seeing psychiatry.

## 2020-07-02 NOTE — Assessment & Plan Note (Signed)
Is s/p surgery.  Has seen neurology.  With intermittent dizziness as outlined.  Schedule f/u with neurology.

## 2020-07-02 NOTE — Assessment & Plan Note (Signed)
Feel multifactorial.  Discussed deconditioning.  Recent cxr with no acute abnormality.  Control acid reflux.  Refer to pulmonary for further evaluation.

## 2020-07-13 ENCOUNTER — Telehealth (HOSPITAL_COMMUNITY): Payer: Medicare HMO | Admitting: Psychiatry

## 2020-07-13 ENCOUNTER — Other Ambulatory Visit: Payer: Self-pay

## 2020-07-13 DIAGNOSIS — F411 Generalized anxiety disorder: Secondary | ICD-10-CM

## 2020-07-13 DIAGNOSIS — F331 Major depressive disorder, recurrent, moderate: Secondary | ICD-10-CM

## 2020-07-13 NOTE — Progress Notes (Unsigned)
Virtual Visit via Telephone Note  I connected with Kathleen Everett on 07/13/20 at  2:00 PM EDT by telephone and verified that I am speaking with the correct person using two identifiers.  Location: Patient: at [place] Provider: office   I discussed the limitations, risks, security and privacy concerns of performing an evaluation and management service by telephone and the availability of in person appointments. I also discussed with the patient that there may be a patient responsible charge related to this service. The patient expressed understanding and agreed to proceed.   History of Present Illness: Kathleen Everett does not like the Prozac because it is causing weight gain. Kathleen Everett is watching her diet to cut back on fats. Her anxiety is high. Her mind is racing and she talks fast. Kathleen Everett bounces from one to the other in the house before finiishing anything. Her friend suggested she may have ADHD.  Her depression is unchanged. She forced herself out the house to do some errands today. Kathleen Everett is in a lot of pain in her back and hips. She has broken sleep and it is hard to fall asleep and once she wakes it takes along time to fall back asleep again. She denies SI/HI.    Observations/Objective:  General Appearance: unable to assess  Eye Contact:  unable to assess  Speech:  {Speech:22685}  Volume:  {Volume (PAA):22686}  Mood:  {BHH MOOD:22306}  Affect:  {Affect (PAA):22687}  Thought Process:  {Thought Process (PAA):22688}  Orientation:  {BHH ORIENTATION (PAA):22689}  Thought Content:  {Thought Content:22690}  Suicidal Thoughts:  {ST/HT (PAA):22692}  Homicidal Thoughts:  {ST/HT (PAA):22692}  Memory:  {BHH MEMORY:22881}  Judgement:  {Judgement (PAA):22694}  Insight:  {Insight (PAA):22695}  Psychomotor Activity: unable to assess  Concentration:  {Concentration:21399}  Recall:  {BHH GOOD/FAIR/POOR:22877}  Fund of Knowledge:  {BHH GOOD/FAIR/POOR:22877}  Language:  {BHH GOOD/FAIR/POOR:22877}   Akathisia:  unable to assess  Handed:  {Handed:22697}  AIMS (if indicated):     Assets:  {Assets (PAA):22698}  ADL's:  unable to assess  Cognition:  {chl bhh cognition:304700322}  Sleep:        Assessment and Plan:  MDD- recurrent, moderate; GAD; r/o ADHD  Start trial of Wellbutrin XL 150mg  po qD  Start Ativan 0.5mg  po qD prn anxiety   D/c Prozac  Follow Up Instructions: In 4-8 weeks ago or sooner if needed   I discussed the assessment and treatment plan with the patient. The patient was provided an opportunity to ask questions and all were answered. The patient agreed with the plan and demonstrated an understanding of the instructions.   The patient was advised to call back or seek an in-person evaluation if the symptoms worsen or if the condition fails to improve as anticipated.  I provided 15 minutes of non-face-to-face time during this encounter.   Charlcie Cradle, MD

## 2020-07-19 ENCOUNTER — Telehealth (HOSPITAL_COMMUNITY): Payer: Self-pay | Admitting: *Deleted

## 2020-07-19 ENCOUNTER — Other Ambulatory Visit: Payer: Self-pay | Admitting: Internal Medicine

## 2020-07-19 NOTE — Telephone Encounter (Signed)
Received t/c from pt stating that she saw DR. On 07/13/20/and is waiting for refills as they have not been sent in as of this time. Please review.

## 2020-07-20 MED ORDER — BUPROPION HCL ER (XL) 150 MG PO TB24: 150.0000 mg | ORAL_TABLET | ORAL | 2 refills | Status: DC

## 2020-07-20 MED ORDER — LORAZEPAM 0.5 MG PO TABS: 0.5000 mg | ORAL_TABLET | Freq: Two times a day (BID) | ORAL | 1 refills | Status: DC

## 2020-07-20 NOTE — Telephone Encounter (Signed)
done

## 2020-07-27 ENCOUNTER — Other Ambulatory Visit: Payer: Self-pay | Admitting: Internal Medicine

## 2020-07-27 DIAGNOSIS — R3914 Feeling of incomplete bladder emptying: Secondary | ICD-10-CM | POA: Diagnosis not present

## 2020-07-27 DIAGNOSIS — R350 Frequency of micturition: Secondary | ICD-10-CM | POA: Diagnosis not present

## 2020-08-11 ENCOUNTER — Other Ambulatory Visit: Payer: Self-pay | Admitting: Internal Medicine

## 2020-08-14 DIAGNOSIS — R3914 Feeling of incomplete bladder emptying: Secondary | ICD-10-CM | POA: Diagnosis not present

## 2020-08-14 DIAGNOSIS — M6289 Other specified disorders of muscle: Secondary | ICD-10-CM | POA: Diagnosis not present

## 2020-08-14 DIAGNOSIS — F419 Anxiety disorder, unspecified: Secondary | ICD-10-CM | POA: Diagnosis not present

## 2020-08-14 DIAGNOSIS — R3912 Poor urinary stream: Secondary | ICD-10-CM | POA: Diagnosis not present

## 2020-08-14 DIAGNOSIS — M6281 Muscle weakness (generalized): Secondary | ICD-10-CM | POA: Diagnosis not present

## 2020-08-14 DIAGNOSIS — M62838 Other muscle spasm: Secondary | ICD-10-CM | POA: Diagnosis not present

## 2020-08-14 DIAGNOSIS — Z96641 Presence of right artificial hip joint: Secondary | ICD-10-CM | POA: Diagnosis not present

## 2020-08-14 DIAGNOSIS — M25451 Effusion, right hip: Secondary | ICD-10-CM | POA: Diagnosis not present

## 2020-08-14 DIAGNOSIS — R109 Unspecified abdominal pain: Secondary | ICD-10-CM | POA: Diagnosis not present

## 2020-08-14 DIAGNOSIS — R350 Frequency of micturition: Secondary | ICD-10-CM | POA: Diagnosis not present

## 2020-08-14 DIAGNOSIS — M25551 Pain in right hip: Secondary | ICD-10-CM | POA: Diagnosis not present

## 2020-08-17 ENCOUNTER — Encounter: Payer: Self-pay | Admitting: Internal Medicine

## 2020-08-17 ENCOUNTER — Telehealth (INDEPENDENT_AMBULATORY_CARE_PROVIDER_SITE_OTHER): Payer: Medicare HMO | Admitting: Internal Medicine

## 2020-08-17 DIAGNOSIS — K219 Gastro-esophageal reflux disease without esophagitis: Secondary | ICD-10-CM | POA: Diagnosis not present

## 2020-08-17 DIAGNOSIS — F439 Reaction to severe stress, unspecified: Secondary | ICD-10-CM

## 2020-08-17 DIAGNOSIS — F320 Major depressive disorder, single episode, mild: Secondary | ICD-10-CM | POA: Diagnosis not present

## 2020-08-17 DIAGNOSIS — G43809 Other migraine, not intractable, without status migrainosus: Secondary | ICD-10-CM

## 2020-08-17 DIAGNOSIS — G4733 Obstructive sleep apnea (adult) (pediatric): Secondary | ICD-10-CM | POA: Diagnosis not present

## 2020-08-17 DIAGNOSIS — Q0700 Arnold-Chiari syndrome without spina bifida or hydrocephalus: Secondary | ICD-10-CM | POA: Diagnosis not present

## 2020-08-17 DIAGNOSIS — D649 Anemia, unspecified: Secondary | ICD-10-CM

## 2020-08-17 DIAGNOSIS — F32A Depression, unspecified: Secondary | ICD-10-CM

## 2020-08-17 DIAGNOSIS — R739 Hyperglycemia, unspecified: Secondary | ICD-10-CM | POA: Diagnosis not present

## 2020-08-17 DIAGNOSIS — E2839 Other primary ovarian failure: Secondary | ICD-10-CM

## 2020-08-17 DIAGNOSIS — G8929 Other chronic pain: Secondary | ICD-10-CM

## 2020-08-17 DIAGNOSIS — M545 Low back pain: Secondary | ICD-10-CM

## 2020-08-17 DIAGNOSIS — E7800 Pure hypercholesterolemia, unspecified: Secondary | ICD-10-CM

## 2020-08-17 DIAGNOSIS — R21 Rash and other nonspecific skin eruption: Secondary | ICD-10-CM

## 2020-08-17 DIAGNOSIS — Z96641 Presence of right artificial hip joint: Secondary | ICD-10-CM

## 2020-08-17 NOTE — Progress Notes (Signed)
Patient ID: Kathleen Everett, female   DOB: Mar 31, 1971, 49 y.o.   MRN: [id number]   Virtual Visit via video Note  This visit type was conducted due to national recommendations for restrictions regarding the COVID-19 pandemic (e.g. social distancing).  This format is felt to be most appropriate for this patient at this time.  All issues noted in this document were discussed and addressed.  No physical exam was performed (except for noted visual exam findings with Video Visits).   I connected with Lauraann Missey by a video enabled telemedicine application and verified that I am speaking with the correct person using two identifiers. Location patient: home Location provider: work  Persons participating in the virtual visit: patient, provider  The limitations, risks, security and privacy concerns of performing an evaluation and management service by video and the availability of in person appointments have been discussed.  It has also been discussed with the patient that there may be a patient responsible charge related to this service. The patient expressed understanding and agreed to proceed.   Reason for visit: scheduled follow up.  HPI: She is s/p right hip arthroplasty 12/28/19.  Had increasing hip pain and had developed an abscess and is s/p excision and drainage of abscess 05/11/20.  Being followed by ortho.reports - still with increased hip pain - affecting her ambulation.  Would like to have PT at home to help with her pain and mobility.  No chest pain.  Breathing stable.  No increased cough or congestion.  No abdominal pain.  Bowels moving.  Had headache yesterday.  Took ibuprofen.  Also took a tylenol last pm.  No headache today.  She is seeing psychiatry.  Records reviewed.  prozac was stopped.  Started on wellbutrin.  Continues to f/u with psych. She also reports increased itching and a rash on her hands.  Persistent. Discussed dermatology referral.     ROS: See pertinent positives  and negatives per HPI.  Past Medical History:  Diagnosis Date  . Anxiety   . Back pain   . Chiari malformation    s/p sgy decompression  . Chronic tension headaches   . Complication of anesthesia   . DDD (degenerative disc disease), lumbar   . Depression   . Diverticulitis   . Dysmenorrhea   . Endometriosis   . Gas pain   . GERD (gastroesophageal reflux disease)   . Hidradenitis   . Hyperlipidemia   . Mitral valve prolapse   . Obesity   . Osteoarthritis   . Ovarian cyst    hx  . PONV (postoperative nausea and vomiting)   . Sleep apnea    central sleep apnea  No CPAP right now  . TMJ (dislocation of temporomandibular joint)   . Vitamin B12 deficiency    On injections    Past Surgical History:  Procedure Laterality Date  . ABDOMINAL HYSTERECTOMY  03/27/07   laparoscopic  . BREAST EXCISIONAL BIOPSY Left 2004   fibroadenoma  . CESAREAN SECTION     1999/2004 twins  . chiari decompression    . COLONOSCOPY WITH PROPOFOL N/A 04/28/2015   Procedure: COLONOSCOPY WITH PROPOFOL;  Surgeon: Manya Silvas, MD;  Location: [facility] ENDOSCOPY;  Service: Endoscopy;  Laterality: N/A;  . CYSTOSCOPY     bladder ulcers  . ESOPHAGOGASTRODUODENOSCOPY N/A 04/28/2015   Procedure: ESOPHAGOGASTRODUODENOSCOPY (EGD);  Surgeon: Manya Silvas, MD;  Location: [facility] ENDOSCOPY;  Service: Endoscopy;  Laterality: N/A;  . HERNIA REPAIR Right 2003   inguinal  .  INCISION AND DRAINAGE HIP Right 05/11/2020   Procedure: IRRIGATION AND DEBRIDEMENT HIP;  Surgeon: Hessie Knows, MD;  Location: [facility] ORS;  Service: Orthopedics;  Laterality: Right;  . OVARIAN CYST REMOVAL  2001   right  . TEMPOROMANDIBULAR JOINT SURGERY  2000  . TOTAL HIP ARTHROPLASTY Right 12/28/2019   Procedure: TOTAL HIP ARTHROPLASTY ANTERIOR APPROACH;  Surgeon: Hessie Knows, MD;  Location: [facility] ORS;  Service: Orthopedics;  Laterality: Right;  . TUBAL LIGATION     Bilateral    Family History  Problem Relation Age of Onset  .  Hyperlipidemia Mother   . Anxiety disorder Mother   . Atrial fibrillation Mother   . Hypertension Mother   . Anxiety disorder Father   . Stroke Father   . Hyperlipidemia Father   . Hypertension Father   . Congestive Heart Failure Father   . Depression Father   . Hyperlipidemia Brother   . Colon cancer Paternal Aunt   . Dementia Paternal Aunt   . Dementia Maternal Grandmother   . Dementia Paternal Grandmother   . ADD / ADHD Daughter   . Breast cancer Neg Hx     SOCIAL HX: reviewed.    Current Outpatient Medications:  .  acetaminophen (TYLENOL) 500 MG tablet, Take 500-1,000 mg by mouth 2 (two) times daily as needed for mild pain. , Disp: , Rfl:  .  AIMOVIG 140 MG/ML SOAJ, , Disp: , Rfl:  .  Ascorbic Acid (VITAMIN C) 500 MG CHEW, Chew 500 mg by mouth daily., Disp: , Rfl:  .  atorvastatin (LIPITOR) 10 MG tablet, Take 1 tablet by mouth once daily, Disp: 30 tablet, Rfl: 0 .  buPROPion (WELLBUTRIN XL) 150 MG 24 hr tablet, Take 1 tablet (150 mg total) by mouth every morning., Disp: 30 tablet, Rfl: 2 .  cyanocobalamin (,VITAMIN B-12,) 1000 MCG/ML injection, INJECT ONE ML (CC) INTRAMUSCULARLY ONCE EVERY MONTH (Patient taking differently: Inject 1,000 mcg into the muscle every 30 (thirty) days. ), Disp: 10 mL, Rfl: 1 .  diazepam (VALIUM) 5 MG tablet, Take 1 tablet (5 mg total) by mouth every 8 (eight) hours as needed for muscle spasms. (Patient not taking: Reported on 07/13/2020), Disp: 20 tablet, Rfl: 0 .  diclofenac sodium (VOLTAREN) 1 % GEL, Apply 1 application topically 4 (four) times daily as needed (pain.). , Disp: , Rfl:  .  esomeprazole (NEXIUM) 40 MG capsule, Take 1 capsule by mouth once daily, Disp: 90 capsule, Rfl: 0 .  estradiol (ESTRACE VAGINAL) 0.1 MG/GM vaginal cream, One applicator q hs x 5 nights and then 2x/week. (Patient taking differently: Place 1 Applicatorful vaginally 2 (two) times a week. ), Disp: 42.5 g, Rfl: 1 .  Fremanezumab-vfrm 225 MG/1.5ML SOAJ, Inject 225 mg into  the skin. , Disp: , Rfl:  .  furosemide (LASIX) 20 MG tablet, Take 1 tablet (20 mg total) by mouth daily as needed (As needed for leg swelling)., Disp: 90 tablet, Rfl: 3 .  gabapentin (NEURONTIN) 400 MG capsule, Take 400 mg by mouth 3 (three) times daily., Disp: , Rfl:  .  ibuprofen (ADVIL) 800 MG tablet, Take 800 mg by mouth 3 (three) times daily as needed (Pain). , Disp: , Rfl:  .  lactase (RA DAIRY AID) 3000 units tablet, Take 9,000 Units by mouth 3 (three) times daily as needed (with dairy consumption). , Disp: , Rfl:  .  LORazepam (ATIVAN) 0.5 MG tablet, Take 1 tablet (0.5 mg total) by mouth 2 (two) times daily., Disp: 60 tablet, Rfl: 1 .  methocarbamol (ROBAXIN) 500 MG tablet, Take 1 tablet (500 mg total) by mouth every 6 (six) hours as needed for muscle spasms. (Patient taking differently: Take 500 mg by mouth 3 (three) times daily. ), Disp: 30 tablet, Rfl: 0 .  mupirocin ointment (BACTROBAN) 2 %, APPLY  TOPICALLY TO AFFECTED AREA UNDER ARM TWICE DAILY, Disp: 22 g, Rfl: 0 .  NARCAN 4 MG/0.1ML LIQD nasal spray kit, Place 0.4 mg into the nose once. , Disp: , Rfl:  .  NON FORMULARY, Take 2 capsules by mouth daily. Thrive vitamins with shake and derma patch, Disp: , Rfl:  .  nystatin cream (MYCOSTATIN), APPLY TOPICALLY TWICE DAILY (Patient taking differently: Apply 1 application topically 2 (two) times daily as needed (heat rash). ), Disp: 30 g, Rfl: 0 .  Oxycodone HCl 10 MG TABS, Take 10 mg by mouth 5 (five) times daily. , Disp: , Rfl: 0 .  potassium chloride (KLOR-CON) 10 MEQ tablet, Take 1 tablet (10 mEq total) by mouth daily as needed (As needed with Furosemide (Lasix))., Disp: 90 tablet, Rfl: 3 .  rizatriptan (MAXALT) 10 MG tablet, Take 10 mg by mouth as needed for migraine. May repeat in 2 hours if needed, Disp: , Rfl:  .  Simethicone (GAS RELIEF PO), Take 125 mg by mouth 2 (two) times daily as needed for flatulence. Anti gas, Disp: , Rfl:  .  tamsulosin (FLOMAX) 0.4 MG CAPS capsule, , Disp:  , Rfl:  .  TOPAMAX 25 MG tablet, Take 25 mg by mouth daily as needed (Pain and headache). , Disp: , Rfl:  .  triamcinolone cream (KENALOG) 0.1 %, APPLY  CREAM EXTERNALLY TWICE DAILY TO AFFECTED AREA ON  FINGERS, Disp: 30 g, Rfl: 0  EXAM:  GENERAL: alert, oriented, appears well and in no acute distress  HEENT: atraumatic, conjunttiva clear, no obvious abnormalities on inspection of external nose and ears  NECK: normal movements of the head and neck  LUNGS: on inspection no signs of respiratory distress, breathing rate appears normal, no obvious gross SOB, gasping or wheezing  CV: no obvious cyanosis  PSYCH/NEURO: pleasant and cooperative, no obvious depression or anxiety, speech and thought processing grossly intact  ASSESSMENT AND PLAN:  Discussed the following assessment and plan:  Sleep apnea CPAP.   Headache, migraine Followed by neurology.  On topamax.  Keep f/u with neurology.  Appears to be stable.   GERD (gastroesophageal reflux disease) No upper symptoms reported.  Nexium.   Arnold-Chiari malformation (HCC) S/p surgery.  Seeing neurology.    Stress Seeing psychiatry.  Increased stress with her medical issues, etc.  Recently prozac stopped and started on wellbutrin.  Continue f/u with psychiatry.   Status post total hip replacement, right Being followed by ortho.  S/p hip replacement and then subsequent excision and drainage (abscess).  With increased pain affecting her mobility.  Discussed with ortho.  ok'd PT with no restrictions.  See phone message.  Home PT.    Mild depression ([place]) On wellbutrin now.  Being followed by psychiatry.   Hyperglycemia Low carb diet and exercise. Follow met b and a1c.   Hypercholesterolemia On lipitor.  Low cholesterol diet and exercise. Follow lipid panel and liver function tests.    Chronic back pain Followed by pain clinic.   Anemia Follow cbc.   Rash of hands Persistent. Have dermatology evaluate.    Orders Placed  This Encounter  Procedures  . DG Bone Density    Standing Status:   Future  Standing Expiration Date:   08/17/2021    Order Specific Question:   Reason for Exam (SYMPTOM  OR DIAGNOSIS REQUIRED)    Answer:   estrogen deficiency.  prednisone use    Order Specific Question:   Is the patient pregnant?    Answer:   No    Order Specific Question:   Preferred imaging location?    Answer:   [facility]  . Hepatic function panel    Standing Status:   Future    Standing Expiration Date:   08/28/2021  . Lipid panel    Standing Status:   Future    Standing Expiration Date:   08/28/2021  . Basic metabolic panel    Standing Status:   Future    Standing Expiration Date:   08/28/2021  . Hemoglobin A1c    Standing Status:   Future    Standing Expiration Date:   08/28/2021  . Ambulatory referral to Home Health    Referral Priority:   Routine    Referral Type:   Home Health Care    Referral Reason:   Specialty Services Required    Requested Specialty:   [place]    Number of Visits Requested:   1  . Ambulatory referral to Dermatology    Referral Priority:   Routine    Referral Type:   Consultation    Referral Reason:   Specialty Services Required    Requested Specialty:   Dermatology    Number of Visits Requested:   1     I discussed the assessment and treatment plan with the patient. The patient was provided an opportunity to ask questions and all were answered. The patient agreed with the plan and demonstrated an understanding of the instructions.   The patient was advised to call back or seek an in-person evaluation if the symptoms worsen or if the condition fails to improve as anticipated.   [name], MD

## 2020-08-17 NOTE — Telephone Encounter (Signed)
Patients appt was changed to [facility]V with Dr. Nicki Reaper

## 2020-08-28 ENCOUNTER — Encounter: Payer: Self-pay | Admitting: Internal Medicine

## 2020-08-28 DIAGNOSIS — R21 Rash and other nonspecific skin eruption: Secondary | ICD-10-CM | POA: Insufficient documentation

## 2020-08-28 NOTE — Assessment & Plan Note (Signed)
Follow cbc.  

## 2020-08-28 NOTE — Assessment & Plan Note (Signed)
Seeing psychiatry.  Increased stress with her medical issues, etc.  Recently prozac stopped and started on wellbutrin.  Continue f/u with psychiatry.

## 2020-08-28 NOTE — Assessment & Plan Note (Signed)
Followed by neurology.  On topamax.  Keep f/u with neurology.  Appears to be stable.

## 2020-08-28 NOTE — Assessment & Plan Note (Signed)
Persistent. Have dermatology evaluate.

## 2020-08-28 NOTE — Assessment & Plan Note (Signed)
Being followed by ortho.  S/p hip replacement and then subsequent excision and drainage (abscess).  With increased pain affecting her mobility.  Discussed with ortho.  ok'd PT with no restrictions.  See phone message.  Home PT.

## 2020-08-28 NOTE — Assessment & Plan Note (Signed)
S/p surgery.  Seeing neurology.

## 2020-08-28 NOTE — Assessment & Plan Note (Signed)
On wellbutrin now.  Being followed by psychiatry.

## 2020-08-28 NOTE — Assessment & Plan Note (Signed)
Followed by pain clinic.  

## 2020-08-28 NOTE — Assessment & Plan Note (Signed)
Low carb diet and exercise. Follow met b and a1c.  

## 2020-08-28 NOTE — Assessment & Plan Note (Signed)
No upper symptoms reported.  Nexium.  

## 2020-08-28 NOTE — Assessment & Plan Note (Signed)
On lipitor.  Low cholesterol diet and exercise.  Follow lipid panel and liver function tests.   

## 2020-08-28 NOTE — Assessment & Plan Note (Signed)
CPAP.  

## 2020-08-29 ENCOUNTER — Ambulatory Visit: Payer: Medicare HMO | Admitting: Dermatology

## 2020-08-29 ENCOUNTER — Other Ambulatory Visit: Payer: Self-pay | Admitting: Internal Medicine

## 2020-08-29 ENCOUNTER — Other Ambulatory Visit: Payer: Self-pay

## 2020-08-29 DIAGNOSIS — G894 Chronic pain syndrome: Secondary | ICD-10-CM | POA: Diagnosis not present

## 2020-08-29 DIAGNOSIS — M7918 Myalgia, other site: Secondary | ICD-10-CM | POA: Diagnosis not present

## 2020-08-29 DIAGNOSIS — B078 Other viral warts: Secondary | ICD-10-CM | POA: Diagnosis not present

## 2020-08-29 DIAGNOSIS — G2581 Restless legs syndrome: Secondary | ICD-10-CM | POA: Diagnosis not present

## 2020-08-29 DIAGNOSIS — R451 Restlessness and agitation: Secondary | ICD-10-CM | POA: Diagnosis not present

## 2020-08-29 DIAGNOSIS — M5442 Lumbago with sciatica, left side: Secondary | ICD-10-CM | POA: Diagnosis not present

## 2020-08-29 DIAGNOSIS — F1190 Opioid use, unspecified, uncomplicated: Secondary | ICD-10-CM | POA: Diagnosis not present

## 2020-08-29 DIAGNOSIS — R452 Unhappiness: Secondary | ICD-10-CM | POA: Diagnosis not present

## 2020-08-29 DIAGNOSIS — L308 Other specified dermatitis: Secondary | ICD-10-CM | POA: Diagnosis not present

## 2020-08-29 DIAGNOSIS — R479 Unspecified speech disturbances: Secondary | ICD-10-CM | POA: Diagnosis not present

## 2020-08-29 DIAGNOSIS — L820 Inflamed seborrheic keratosis: Secondary | ICD-10-CM | POA: Diagnosis not present

## 2020-08-29 MED ORDER — MUPIROCIN 2 % EX OINT: 1.0000 "application " | TOPICAL_OINTMENT | Freq: Every day | CUTANEOUS | 0 refills | Status: DC

## 2020-08-29 MED ORDER — CLOBETASOL PROPIONATE 0.05 % EX OINT: 1.0000 "application " | TOPICAL_OINTMENT | Freq: Two times a day (BID) | CUTANEOUS | 0 refills | Status: DC

## 2020-08-29 NOTE — Progress Notes (Signed)
New Patient Visit  Subjective  Kathleen Everett is a 49 y.o. female who presents for the following: Rash.  Patient here today for eczema at hands. She has had eczema for years. PCP gave her [facility] 0.1% cream to use twice daily to fingers with minimal improvement. Once rash clears and she stops the cream it comes right back within a few days. She does use lotion daily.   The following portions of the chart were reviewed this encounter and updated as appropriate:  Tobacco  Allergies  Meds  Problems  Med Hx  Surg Hx  Fam Hx      Review of Systems:  No other skin or systemic complaints except as noted in HPI or Assessment and Plan.  Objective  Well appearing patient in no apparent distress; mood and affect are within normal limits.  A focused examination was performed including hands, arms, fingernails, chest, bilateral feet. Relevant physical exam findings are noted in the Assessment and Plan.  Objective  bilateral hands: Scaly erythematous plaques with fissures  Objective  right dorsal hand x 13, left dorsal hand x 32 (45): Flat verrucous papules  Objective  Mid chest: Erythematous keratotic or waxy stuck-on papule or plaque.    Assessment & Plan  Other eczema bilateral hands  Chronic, flared  Start clobetasol ointment twice a day as needed up to 3 weeks, consider Eucrisa. Avoid applying to face, groin, and axilla. Use as directed. Risk of skin atrophy with long-term use reviewed.  Start mupirocin daily to open areas.  Gentle Skin Care with Neutrogena [place] Hand Cream to damp skin after handwashing.   Topical steroids (such as triamcinolone, fluocinolone, fluocinonide, mometasone, clobetasol, halobetasol, betamethasone, hydrocortisone) can cause thinning and lightening of the skin if they are used for too long in the same area. Your physician has selected the right strength medicine for your problem and area affected on the body. Please use your medication  only as directed by your physician to prevent side effects.    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  Discussed multiple treatments may be required to clear warts.  Discussed possible post-treatment dyspigmentation and risk of recurrence.  Prior to procedure, discussed risks of blister formation, small wound, skin dyspigmentation, or rare scar following cryotherapy.    Destruction of lesion - right dorsal hand x 13, left dorsal hand x 32 Complexity: simple   Destruction method: cryotherapy   Informed consent: discussed and consent obtained   Lesion destroyed using liquid nitrogen: Yes   Cryotherapy cycles:  2 Outcome: patient tolerated procedure well with no complications   Post-procedure details: wound care instructions given    Inflamed seborrheic keratosis Mid chest  Prior to procedure, discussed risks of blister formation, small wound, skin dyspigmentation, or rare scar following cryotherapy.    Destruction of lesion - Mid chest Complexity: simple   Destruction method: cryotherapy   Informed consent: discussed and consent obtained   Lesion destroyed using liquid nitrogen: Yes   Cryotherapy cycles:  2 Outcome: patient tolerated procedure well with no complications   Post-procedure details: wound care instructions given    Return 3-4 weeks.  Graciella Belton, RMA, am acting as scribe for Forest Gleason, MD .  Documentation: I have reviewed the above documentation for accuracy and completeness, and I agree with the  above.  Forest Gleason, MD

## 2020-08-29 NOTE — Patient Instructions (Addendum)
  Cryotherapy Aftercare  . Wash gently with soap and water everyday.   Marland Kitchen Apply Vaseline and Band-Aid daily until healed.  Prior to procedure, discussed risks of blister formation, small wound, skin dyspigmentation, or rare scar following cryotherapy.   Topical steroids (such as triamcinolone, fluocinolone, fluocinonide, mometasone, clobetasol, halobetasol, betamethasone, hydrocortisone) can cause thinning and lightening of the skin if they are used for too long in the same area. Your physician has selected the right strength medicine for your problem and area affected on the body. Please use your medication only as directed by your physician to prevent side effects.   Gentle Skin Care with Neutrogena Norwegian Hand Cream.    Gentle Skin Care Guide  1. Bathe no more than once a day.  2. Avoid bathing in hot water  3. Use a mild soap like Dove, Vanicream, Cetaphil, CeraVe. Can use Lever 2000 or Cetaphil antibacterial soap  4. Use soap only where you need it. On most days, use it under your arms, between your legs, and on your feet. Let the water rinse other areas unless visibly dirty.  5. When you get out of the bath/shower, use a towel to gently blot your skin dry, don't rub it.  6. While your skin is still a little damp, apply a moisturizing cream such as Vanicream, CeraVe, Cetaphil, Eucerin, Sarna lotion or plain Vaseline Jelly. For hands apply Neutrogena [place] Hand Cream or Excipial Hand Cream.  7. Reapply moisturizer any time you start to itch or feel dry.  8. Sometimes using free and clear laundry detergents can be helpful. Fabric softener sheets should be avoided. Downy Free & Gentle liquid, or any liquid fabric softener that is free of dyes and perfumes, it acceptable to use  9. If your doctor has given you prescription creams you may apply moisturizers over them   Recommend over the counter Urea cream to feet.

## 2020-08-30 ENCOUNTER — Encounter: Payer: Self-pay | Admitting: Dermatology

## 2020-08-30 DIAGNOSIS — M7989 Other specified soft tissue disorders: Secondary | ICD-10-CM | POA: Diagnosis not present

## 2020-08-30 DIAGNOSIS — E538 Deficiency of other specified B group vitamins: Secondary | ICD-10-CM | POA: Diagnosis not present

## 2020-08-30 DIAGNOSIS — G43019 Migraine without aura, intractable, without status migrainosus: Secondary | ICD-10-CM | POA: Diagnosis not present

## 2020-08-30 DIAGNOSIS — G935 Compression of brain: Secondary | ICD-10-CM | POA: Diagnosis not present

## 2020-08-30 DIAGNOSIS — M2550 Pain in unspecified joint: Secondary | ICD-10-CM | POA: Diagnosis not present

## 2020-08-30 DIAGNOSIS — G4733 Obstructive sleep apnea (adult) (pediatric): Secondary | ICD-10-CM | POA: Diagnosis not present

## 2020-08-30 DIAGNOSIS — M25471 Effusion, right ankle: Secondary | ICD-10-CM | POA: Diagnosis not present

## 2020-08-30 DIAGNOSIS — R4189 Other symptoms and signs involving cognitive functions and awareness: Secondary | ICD-10-CM | POA: Diagnosis not present

## 2020-08-30 DIAGNOSIS — E559 Vitamin D deficiency, unspecified: Secondary | ICD-10-CM | POA: Diagnosis not present

## 2020-08-31 ENCOUNTER — Other Ambulatory Visit: Payer: Self-pay

## 2020-08-31 ENCOUNTER — Telehealth (INDEPENDENT_AMBULATORY_CARE_PROVIDER_SITE_OTHER): Payer: Medicare HMO | Admitting: Psychiatry

## 2020-08-31 DIAGNOSIS — F411 Generalized anxiety disorder: Secondary | ICD-10-CM

## 2020-08-31 DIAGNOSIS — F331 Major depressive disorder, recurrent, moderate: Secondary | ICD-10-CM

## 2020-08-31 MED ORDER — BUPROPION HCL ER (XL) 300 MG PO TB24: 300.0000 mg | ORAL_TABLET | ORAL | 0 refills | Status: DC

## 2020-08-31 NOTE — Progress Notes (Signed)
Virtual Visit via Telephone Note  I connected with Kathleen Everett on 08/31/20 at  2:45 PM EDT by telephone and verified that I am speaking with the correct person using two identifiers.  Location: Patient: home Provider: office   I discussed the limitations, risks, security and privacy concerns of performing an evaluation and management service by telephone and the availability of in person appointments. I also discussed with the patient that there may be a patient responsible charge related to this service. The patient expressed understanding and agreed to proceed.   History of Present Illness: Kathleen Everett is dealing with a number of stressors. Kathleen Everett's daughter had to have unexpected hip surgery this past Monday. Kathleen Everett has been dealing with that. She has a lot of anxiety. She took Ativan once and it made her so sleepy she had to take a nap. After waking she was still feeling agitated. Kathleen Everett has not taken it again. Kathleen Everett now thinks the weight gain was caused by back injections rather than the Prozac. Her depression is overall unchanged. The Wellbutrin has helped to decrease her crying spells in intensity and frequency. Kathleen Everett gets very irritated and upset when she wants something done and it isn't done right away. It is something that she has not been able to control for the last many years. Kathleen Everett went to her neurologist yesterday for headaches and was started on Propranolol and NP suggested patient may have ADHD. Kathleen Everett denies SI/HI.    Observations/Objective:  General Appearance: unable to assess  Eye Contact:  unable to assess  Speech:  Clear and Coherent and Normal Rate  Volume:  Normal  Mood:  Depressed  Affect:  Congruent  Thought Process:  Coherent and Descriptions of Associations: Intact  Orientation:  Full (Time, Place, and Person)  Thought Content:  Logical  Suicidal Thoughts:  No  Homicidal Thoughts:  No  Memory:  Immediate;   Fair  Judgement:  Fair  Insight:  Fair   Psychomotor Activity: unable to assess  Concentration:  Concentration: Fair  Recall:  [employer] of Knowledge:  Good  Language:  Good  Akathisia:  unable to assess  Handed:  Right  AIMS (if indicated):     Assets:  Communication Skills Desire for Improvement Financial Resources/Insurance Housing Talents/Skills Transportation Vocational/Educational  ADL's:  unable to assess  Cognition:  WNL  Sleep:         Assessment and Plan:  MDD- recurrent, moderate; GAD; r/o ADHD  Increase Wellbutrin XL 300mg  po qD  D/c Ativan due to sedation  Propranolol started by neurologist.    Follow Up Instructions: In 2-3 months or sooner if needed   I discussed the assessment and treatment plan with the patient. The patient was provided an opportunity to ask questions and all were answered. The patient agreed with the plan and demonstrated an understanding of the instructions.   The patient was advised to call back or seek an in-person evaluation if the symptoms worsen or if the condition fails to improve as anticipated.  I provided 15 minutes of non-face-to-face time during this encounter.   Charlcie Cradle, MD

## 2020-09-01 ENCOUNTER — Telehealth: Payer: Self-pay

## 2020-09-01 DIAGNOSIS — M545 Low back pain, unspecified: Secondary | ICD-10-CM | POA: Diagnosis not present

## 2020-09-01 DIAGNOSIS — G4733 Obstructive sleep apnea (adult) (pediatric): Secondary | ICD-10-CM | POA: Diagnosis not present

## 2020-09-01 DIAGNOSIS — G8929 Other chronic pain: Secondary | ICD-10-CM | POA: Diagnosis not present

## 2020-09-01 DIAGNOSIS — M1611 Unilateral primary osteoarthritis, right hip: Secondary | ICD-10-CM | POA: Diagnosis not present

## 2020-09-01 DIAGNOSIS — M25551 Pain in right hip: Secondary | ICD-10-CM | POA: Diagnosis not present

## 2020-09-01 DIAGNOSIS — G43819 Other migraine, intractable, without status migrainosus: Secondary | ICD-10-CM | POA: Diagnosis not present

## 2020-09-01 DIAGNOSIS — Q0700 Arnold-Chiari syndrome without spina bifida or hydrocephalus: Secondary | ICD-10-CM | POA: Diagnosis not present

## 2020-09-01 DIAGNOSIS — E2839 Other primary ovarian failure: Secondary | ICD-10-CM | POA: Diagnosis not present

## 2020-09-01 DIAGNOSIS — T8451XS Infection and inflammatory reaction due to internal right hip prosthesis, sequela: Secondary | ICD-10-CM | POA: Diagnosis not present

## 2020-09-01 DIAGNOSIS — K219 Gastro-esophageal reflux disease without esophagitis: Secondary | ICD-10-CM | POA: Diagnosis not present

## 2020-09-01 NOTE — Telephone Encounter (Signed)
Can confirm with pt if taking those medications that are not in her home.  The interactions are with medications prescribed by her pain clinic.  Will need to forward information to them.  Thanks

## 2020-09-01 NOTE — Telephone Encounter (Signed)
[facility] nurse called and stated that there are some things on this patients medication list that are not in the home and she wants you to be aware:  Voltaren gel, estradiol cream, fremanezumab-vfrm, lactase, Maxalt, and Topamax.  Also she stated there are some interactions with some medications that is given a red flag in their system:  methocarbamol with Oxycodone, Lorazepam with Oxycodone, Gabapentin with Oxycodone, Gabapentin with Lorazepam, and bupropion HCL with oxycodone.  Please advise.  Raquel Racey,cma

## 2020-09-01 NOTE — Telephone Encounter (Signed)
Pt needs Occupational Therapy. This was not ordered before. She has not way of faxing the order.   She also needs a call back. She said pt is having interreactions with her medications and she needs to speak with a nurse.

## 2020-09-04 ENCOUNTER — Ambulatory Visit: Payer: Medicare HMO | Attending: Physical Therapy | Admitting: Physical Therapy

## 2020-09-05 DIAGNOSIS — K219 Gastro-esophageal reflux disease without esophagitis: Secondary | ICD-10-CM | POA: Diagnosis not present

## 2020-09-05 DIAGNOSIS — Q0700 Arnold-Chiari syndrome without spina bifida or hydrocephalus: Secondary | ICD-10-CM | POA: Diagnosis not present

## 2020-09-05 DIAGNOSIS — T8451XS Infection and inflammatory reaction due to internal right hip prosthesis, sequela: Secondary | ICD-10-CM | POA: Diagnosis not present

## 2020-09-05 DIAGNOSIS — G4733 Obstructive sleep apnea (adult) (pediatric): Secondary | ICD-10-CM | POA: Diagnosis not present

## 2020-09-05 DIAGNOSIS — E2839 Other primary ovarian failure: Secondary | ICD-10-CM | POA: Diagnosis not present

## 2020-09-05 DIAGNOSIS — G8929 Other chronic pain: Secondary | ICD-10-CM | POA: Diagnosis not present

## 2020-09-05 DIAGNOSIS — M545 Low back pain, unspecified: Secondary | ICD-10-CM | POA: Diagnosis not present

## 2020-09-05 DIAGNOSIS — M25551 Pain in right hip: Secondary | ICD-10-CM | POA: Diagnosis not present

## 2020-09-05 DIAGNOSIS — G43819 Other migraine, intractable, without status migrainosus: Secondary | ICD-10-CM | POA: Diagnosis not present

## 2020-09-05 NOTE — Telephone Encounter (Signed)
Per patient, disregard message. She is taking all of these medications.

## 2020-09-07 DIAGNOSIS — K219 Gastro-esophageal reflux disease without esophagitis: Secondary | ICD-10-CM | POA: Diagnosis not present

## 2020-09-07 DIAGNOSIS — G43819 Other migraine, intractable, without status migrainosus: Secondary | ICD-10-CM | POA: Diagnosis not present

## 2020-09-07 DIAGNOSIS — M545 Low back pain, unspecified: Secondary | ICD-10-CM | POA: Diagnosis not present

## 2020-09-07 DIAGNOSIS — T8451XS Infection and inflammatory reaction due to internal right hip prosthesis, sequela: Secondary | ICD-10-CM | POA: Diagnosis not present

## 2020-09-07 DIAGNOSIS — G4733 Obstructive sleep apnea (adult) (pediatric): Secondary | ICD-10-CM | POA: Diagnosis not present

## 2020-09-07 DIAGNOSIS — M25551 Pain in right hip: Secondary | ICD-10-CM | POA: Diagnosis not present

## 2020-09-07 DIAGNOSIS — Q0700 Arnold-Chiari syndrome without spina bifida or hydrocephalus: Secondary | ICD-10-CM | POA: Diagnosis not present

## 2020-09-07 DIAGNOSIS — E2839 Other primary ovarian failure: Secondary | ICD-10-CM | POA: Diagnosis not present

## 2020-09-07 DIAGNOSIS — G8929 Other chronic pain: Secondary | ICD-10-CM | POA: Diagnosis not present

## 2020-09-08 DIAGNOSIS — Q0700 Arnold-Chiari syndrome without spina bifida or hydrocephalus: Secondary | ICD-10-CM | POA: Diagnosis not present

## 2020-09-08 DIAGNOSIS — K219 Gastro-esophageal reflux disease without esophagitis: Secondary | ICD-10-CM | POA: Diagnosis not present

## 2020-09-08 DIAGNOSIS — M545 Low back pain, unspecified: Secondary | ICD-10-CM | POA: Diagnosis not present

## 2020-09-08 DIAGNOSIS — E2839 Other primary ovarian failure: Secondary | ICD-10-CM | POA: Diagnosis not present

## 2020-09-08 DIAGNOSIS — T8451XS Infection and inflammatory reaction due to internal right hip prosthesis, sequela: Secondary | ICD-10-CM | POA: Diagnosis not present

## 2020-09-08 DIAGNOSIS — G8929 Other chronic pain: Secondary | ICD-10-CM | POA: Diagnosis not present

## 2020-09-08 DIAGNOSIS — G43819 Other migraine, intractable, without status migrainosus: Secondary | ICD-10-CM | POA: Diagnosis not present

## 2020-09-08 DIAGNOSIS — M25551 Pain in right hip: Secondary | ICD-10-CM | POA: Diagnosis not present

## 2020-09-08 DIAGNOSIS — G4733 Obstructive sleep apnea (adult) (pediatric): Secondary | ICD-10-CM | POA: Diagnosis not present

## 2020-09-11 DIAGNOSIS — K219 Gastro-esophageal reflux disease without esophagitis: Secondary | ICD-10-CM | POA: Diagnosis not present

## 2020-09-11 DIAGNOSIS — G8929 Other chronic pain: Secondary | ICD-10-CM | POA: Diagnosis not present

## 2020-09-11 DIAGNOSIS — G43819 Other migraine, intractable, without status migrainosus: Secondary | ICD-10-CM | POA: Diagnosis not present

## 2020-09-11 DIAGNOSIS — G4733 Obstructive sleep apnea (adult) (pediatric): Secondary | ICD-10-CM | POA: Diagnosis not present

## 2020-09-11 DIAGNOSIS — T8451XS Infection and inflammatory reaction due to internal right hip prosthesis, sequela: Secondary | ICD-10-CM | POA: Diagnosis not present

## 2020-09-11 DIAGNOSIS — E2839 Other primary ovarian failure: Secondary | ICD-10-CM | POA: Diagnosis not present

## 2020-09-11 DIAGNOSIS — Q0700 Arnold-Chiari syndrome without spina bifida or hydrocephalus: Secondary | ICD-10-CM | POA: Diagnosis not present

## 2020-09-11 DIAGNOSIS — M25551 Pain in right hip: Secondary | ICD-10-CM | POA: Diagnosis not present

## 2020-09-11 DIAGNOSIS — M545 Low back pain, unspecified: Secondary | ICD-10-CM | POA: Diagnosis not present

## 2020-09-13 DIAGNOSIS — M2550 Pain in unspecified joint: Secondary | ICD-10-CM | POA: Diagnosis not present

## 2020-09-13 DIAGNOSIS — M7989 Other specified soft tissue disorders: Secondary | ICD-10-CM | POA: Diagnosis not present

## 2020-09-13 DIAGNOSIS — M25472 Effusion, left ankle: Secondary | ICD-10-CM | POA: Diagnosis not present

## 2020-09-13 DIAGNOSIS — M25471 Effusion, right ankle: Secondary | ICD-10-CM | POA: Diagnosis not present

## 2020-09-13 DIAGNOSIS — E538 Deficiency of other specified B group vitamins: Secondary | ICD-10-CM | POA: Diagnosis not present

## 2020-09-13 DIAGNOSIS — E559 Vitamin D deficiency, unspecified: Secondary | ICD-10-CM | POA: Diagnosis not present

## 2020-09-14 DIAGNOSIS — G8929 Other chronic pain: Secondary | ICD-10-CM | POA: Diagnosis not present

## 2020-09-14 DIAGNOSIS — M545 Low back pain, unspecified: Secondary | ICD-10-CM | POA: Diagnosis not present

## 2020-09-14 DIAGNOSIS — G43819 Other migraine, intractable, without status migrainosus: Secondary | ICD-10-CM | POA: Diagnosis not present

## 2020-09-14 DIAGNOSIS — M25551 Pain in right hip: Secondary | ICD-10-CM | POA: Diagnosis not present

## 2020-09-14 DIAGNOSIS — E2839 Other primary ovarian failure: Secondary | ICD-10-CM | POA: Diagnosis not present

## 2020-09-14 DIAGNOSIS — T8451XS Infection and inflammatory reaction due to internal right hip prosthesis, sequela: Secondary | ICD-10-CM | POA: Diagnosis not present

## 2020-09-14 DIAGNOSIS — K219 Gastro-esophageal reflux disease without esophagitis: Secondary | ICD-10-CM | POA: Diagnosis not present

## 2020-09-14 DIAGNOSIS — Q0700 Arnold-Chiari syndrome without spina bifida or hydrocephalus: Secondary | ICD-10-CM | POA: Diagnosis not present

## 2020-09-14 DIAGNOSIS — G4733 Obstructive sleep apnea (adult) (pediatric): Secondary | ICD-10-CM | POA: Diagnosis not present

## 2020-09-18 DIAGNOSIS — E2839 Other primary ovarian failure: Secondary | ICD-10-CM | POA: Diagnosis not present

## 2020-09-18 DIAGNOSIS — G4733 Obstructive sleep apnea (adult) (pediatric): Secondary | ICD-10-CM | POA: Diagnosis not present

## 2020-09-18 DIAGNOSIS — T8451XS Infection and inflammatory reaction due to internal right hip prosthesis, sequela: Secondary | ICD-10-CM | POA: Diagnosis not present

## 2020-09-18 DIAGNOSIS — M545 Low back pain, unspecified: Secondary | ICD-10-CM | POA: Diagnosis not present

## 2020-09-18 DIAGNOSIS — K219 Gastro-esophageal reflux disease without esophagitis: Secondary | ICD-10-CM | POA: Diagnosis not present

## 2020-09-18 DIAGNOSIS — Q0700 Arnold-Chiari syndrome without spina bifida or hydrocephalus: Secondary | ICD-10-CM | POA: Diagnosis not present

## 2020-09-18 DIAGNOSIS — M25551 Pain in right hip: Secondary | ICD-10-CM | POA: Diagnosis not present

## 2020-09-18 DIAGNOSIS — G8929 Other chronic pain: Secondary | ICD-10-CM | POA: Diagnosis not present

## 2020-09-18 DIAGNOSIS — G43819 Other migraine, intractable, without status migrainosus: Secondary | ICD-10-CM | POA: Diagnosis not present

## 2020-09-19 DIAGNOSIS — M47816 Spondylosis without myelopathy or radiculopathy, lumbar region: Secondary | ICD-10-CM | POA: Diagnosis not present

## 2020-09-20 ENCOUNTER — Ambulatory Visit
Admission: RE | Admit: 2020-09-20 | Discharge: 2020-09-20 | Disposition: A | Discharge status: Home / Self Care | Payer: Medicare HMO | Source: Ambulatory Visit | Attending: Internal Medicine | Admitting: Internal Medicine

## 2020-09-20 ENCOUNTER — Other Ambulatory Visit: Payer: Self-pay

## 2020-09-20 DIAGNOSIS — E2839 Other primary ovarian failure: Secondary | ICD-10-CM | POA: Diagnosis not present

## 2020-09-20 DIAGNOSIS — Z780 Asymptomatic menopausal state: Secondary | ICD-10-CM | POA: Diagnosis not present

## 2020-09-21 ENCOUNTER — Telehealth: Payer: Self-pay | Admitting: Dermatology

## 2020-09-21 ENCOUNTER — Ambulatory Visit: Payer: Medicare HMO | Admitting: Dermatology

## 2020-09-21 MED ORDER — BETAMETHASONE DIPROPIONATE AUG 0.05 % EX CREA: TOPICAL_CREAM | CUTANEOUS | 0 refills | Status: DC

## 2020-09-21 NOTE — Telephone Encounter (Signed)
Spoke with patient and advised her that clobetasol cream not preferred with insurance and that augmented betamethasone was sent in to use in the morning to hands. Patient to continue clobetasol ointment nightly until her appointment next week/js

## 2020-09-21 NOTE — Telephone Encounter (Signed)
Patient had to leave due to delay in clinic this morning.  Spoke with patient. Will reschedule her for 8:15 am next Tuesday AM. She reports hands are still irritated. She is using clobetasol ointment most nights but due to being greasy isnt using it often in the morning. She is doing gentle skin care and applying emollient to damp skin after washing.   Will start augmented betamethasone cream qam (sent to pharmacy already for patient) to hands between now and next week's appointment and continue clobetasol every night if able.   MAs please call and let her know clobetasol cream wasn't preferred for her insurance so I sent an alternative - betamethasone to use instead.   Thank you!

## 2020-09-22 DIAGNOSIS — M25551 Pain in right hip: Secondary | ICD-10-CM | POA: Diagnosis not present

## 2020-09-22 DIAGNOSIS — G8929 Other chronic pain: Secondary | ICD-10-CM | POA: Diagnosis not present

## 2020-09-22 DIAGNOSIS — G4733 Obstructive sleep apnea (adult) (pediatric): Secondary | ICD-10-CM | POA: Diagnosis not present

## 2020-09-22 DIAGNOSIS — K219 Gastro-esophageal reflux disease without esophagitis: Secondary | ICD-10-CM | POA: Diagnosis not present

## 2020-09-22 DIAGNOSIS — T8451XS Infection and inflammatory reaction due to internal right hip prosthesis, sequela: Secondary | ICD-10-CM | POA: Diagnosis not present

## 2020-09-22 DIAGNOSIS — Q0700 Arnold-Chiari syndrome without spina bifida or hydrocephalus: Secondary | ICD-10-CM | POA: Diagnosis not present

## 2020-09-22 DIAGNOSIS — G43819 Other migraine, intractable, without status migrainosus: Secondary | ICD-10-CM | POA: Diagnosis not present

## 2020-09-22 DIAGNOSIS — E2839 Other primary ovarian failure: Secondary | ICD-10-CM | POA: Diagnosis not present

## 2020-09-22 DIAGNOSIS — M545 Low back pain, unspecified: Secondary | ICD-10-CM | POA: Diagnosis not present

## 2020-09-26 ENCOUNTER — Encounter: Payer: Self-pay | Admitting: Dermatology

## 2020-09-26 ENCOUNTER — Ambulatory Visit (INDEPENDENT_AMBULATORY_CARE_PROVIDER_SITE_OTHER): Payer: Medicare HMO | Admitting: Dermatology

## 2020-09-26 ENCOUNTER — Other Ambulatory Visit: Payer: Self-pay

## 2020-09-26 DIAGNOSIS — L820 Inflamed seborrheic keratosis: Secondary | ICD-10-CM | POA: Diagnosis not present

## 2020-09-26 DIAGNOSIS — B079 Viral wart, unspecified: Secondary | ICD-10-CM | POA: Diagnosis not present

## 2020-09-26 DIAGNOSIS — D2371 Other benign neoplasm of skin of right lower limb, including hip: Secondary | ICD-10-CM

## 2020-09-26 DIAGNOSIS — D239 Other benign neoplasm of skin, unspecified: Secondary | ICD-10-CM

## 2020-09-26 DIAGNOSIS — L309 Dermatitis, unspecified: Secondary | ICD-10-CM

## 2020-09-26 MED ORDER — EUCRISA 2 % EX OINT: TOPICAL_OINTMENT | CUTANEOUS | 2 refills | Status: DC

## 2020-09-26 MED ORDER — CLOBETASOL PROPIONATE 0.05 % EX FOAM: CUTANEOUS | 2 refills | Status: DC

## 2020-09-26 MED ORDER — HYDROXYZINE HCL 25 MG PO TABS: ORAL_TABLET | ORAL | 1 refills | Status: DC

## 2020-09-26 NOTE — Patient Instructions (Addendum)
Topical steroids (such as triamcinolone, fluocinolone, fluocinonide, mometasone, clobetasol, halobetasol, betamethasone, hydrocortisone) can cause thinning and lightening of the skin if they are used for too long in the same area. Your physician has selected the right strength medicine for your problem and area affected on the body. Please use your medication only as directed by your physician to prevent side effects.   Recommend Gold Bond Rapid Relief Anti-Itch cream up to 3 times per day to areas that are itchy.  Cryotherapy Aftercare  . Wash gently with soap and water everyday.   Marland Kitchen Apply Vaseline and Band-Aid daily until healed.   Instructions for After In-Office Application of Cantharidin  1. This is a strong medicine; please follow ALL instructions.  2. Gently wash off with soap and water in four hours or sooner s directed by your physician.  3. **WARNING** this medicine can cause severe blistering, blood blisters, infection, and/or scarring if it is not washed off as directed.  4. Your progress will be rechecked in 1-2 months; call sooner if there are any questions or problems.  Viral Warts & Molluscum Contagiosum  Viral warts and molluscum contagiosum are growths of the skin caused by viral infection of the skin. If you have been given the diagnosis of viral warts or molluscum contagiosum there are a few things that you must understand about your condition:  1. There is no guaranteed treatment method available for this condition. 2. Multiple treatments may be required, 3. The treatments may be time consuming and require multiple visits to the dermatology office. 4. The treatment may be expensive. You will be charged each time you come into the office to have the spots treated. 5. The treated areas may develop new lesions further complicating treatment. 6. The treated areas may leave a scar. 7. There is no guarantee that even after multiple treatments that the spots will be  successfully treated. 8. These are caused by a viral infection and can be spread to other areas of the skin and to other people by direct contact. Therefore, new spots may occur.

## 2020-09-26 NOTE — Progress Notes (Signed)
Follow-Up Visit   Subjective  Kathleen Everett is a 49 y.o. female who presents for the following: Eczema (Patient here for 4 week eczema and wart follow up. She is using clobetasol ointment and Neutrogena [place] hand cream. Warts at hands were treated with LN2. ).  Her hands have improved but are still itchy and dry. Clobetasol cream not covered on her insurance so she is continuing to use ointment.  Patient had an ISK treated at last visit at mid chest and she advises there is still residual that is itchy. Also a spot at right lower leg she would like looked at. It gets cut when shaving and is irritated for her.  The following portions of the chart were reviewed this encounter and updated as appropriate:  Tobacco  Allergies  Meds  Problems  Med Hx  Surg Hx  Fam Hx      Review of Systems:  No other skin or systemic complaints except as noted in HPI or Assessment and Plan.  Objective  Well appearing patient in no apparent distress; mood and affect are within normal limits.  A focused examination was performed including hands, chest, legs. Relevant physical exam findings are noted in the Assessment and Plan.  Objective  Left hand x 26, right hand x 19 (45): Verrucous papules  Objective  Right Lower Leg: Firm brown papule with dimple sign.   Objective  Chest - Medial [facility]): Erythematous keratotic or waxy stuck-on papule or plaque.   Objective  bilateral hands: Scaly pink plaques and fissures at hands, particularly at fingertips.    Assessment & Plan  Viral warts, unspecified type (45) Left hand x 26, right hand x 19  Cantharidin is a blistering agent that comes from a beetle.  It needs to be washed off in about 4 hours after application.  Although it is painless when applied in office, it may cause symptoms of mild pain and burning several hours later.  Treated areas will swell and turn red, and blisters may form.  Vaseline and a bandaid may be applied  until wound has healed.  Once healed, the skin may remain temporarily discolored.    Wash off with soap and water in 4 hours or sooner if it becomes tender before then  Destruction of lesion - Left hand x 26, right hand x 19 Complexity: simple   Destruction method: chemical removal   Informed consent: discussed and consent obtained   Timeout:  patient name, date of birth, surgical site, and procedure verified Chemical destruction method: cantharidin   Application time:  4 hours Procedure instructions: patient instructed to wash and dry area   Outcome: patient tolerated procedure well with no complications   Post-procedure details: wound care instructions given    Dermatofibroma Right Lower Leg  Symptomatic, gets cut when shaving.  Prior to procedure, discussed risks of blister formation, small wound, skin dyspigmentation, or rare scar following cryotherapy.     Destruction of lesion - Right Lower Leg Complexity: simple   Destruction method: cryotherapy   Informed consent: discussed and consent obtained   Lesion destroyed using liquid nitrogen: Yes   Cryotherapy cycles:  2 Outcome: patient tolerated procedure well with no complications   Post-procedure details: wound care instructions given    Inflamed seborrheic keratosis Chest - Medial (Center)  Prior to procedure, discussed risks of blister formation, small wound, skin dyspigmentation, or rare scar following cryotherapy.    Destruction of lesion - Chest - Medial [facility]) Complexity: simple  Destruction method: cryotherapy   Informed consent: discussed and consent obtained   Lesion destroyed using liquid nitrogen: Yes   Cryotherapy cycles:  2 Outcome: patient tolerated procedure well with no complications   Post-procedure details: wound care instructions given    Hand dermatitis bilateral hands  Chronic, slightly improved but not at goal  D/c clobetasol ointment  Start clobetasol foam twice daily to hands for  up to 2 weeks then weekends only. Avoid applying to face, groin, and axilla. Use as directed. Risk of skin atrophy with long-term use reviewed.   Once improved start Eucrisa one to two times daily for maintenance.   Start hydroxyzine 25mg  1-2 qhs as needed.  Consider patch testing if not improving.   Recommend Gold Bond Rapid Relief Anti-Itch cream up to 3 times per day to areas that are itchy.   clobetasol (OLUX) 0.05 % topical foam - bilateral hands  Crisaborole (EUCRISA) 2 % OINT - bilateral hands  hydrOXYzine (ATARAX/VISTARIL) 25 MG tablet - bilateral hands  Return 3-4 weeks.  Graciella Belton, RMA, am acting as scribe for Forest Gleason, MD .  Documentation: I have reviewed the above documentation for accuracy and completeness, and I agree with the above.  Forest Gleason, MD

## 2020-09-27 DIAGNOSIS — K219 Gastro-esophageal reflux disease without esophagitis: Secondary | ICD-10-CM | POA: Diagnosis not present

## 2020-09-27 DIAGNOSIS — G43819 Other migraine, intractable, without status migrainosus: Secondary | ICD-10-CM | POA: Diagnosis not present

## 2020-09-27 DIAGNOSIS — E2839 Other primary ovarian failure: Secondary | ICD-10-CM | POA: Diagnosis not present

## 2020-09-27 DIAGNOSIS — M25551 Pain in right hip: Secondary | ICD-10-CM | POA: Diagnosis not present

## 2020-09-27 DIAGNOSIS — M545 Low back pain, unspecified: Secondary | ICD-10-CM | POA: Diagnosis not present

## 2020-09-27 DIAGNOSIS — G8929 Other chronic pain: Secondary | ICD-10-CM | POA: Diagnosis not present

## 2020-09-27 DIAGNOSIS — G4733 Obstructive sleep apnea (adult) (pediatric): Secondary | ICD-10-CM | POA: Diagnosis not present

## 2020-09-27 DIAGNOSIS — Q0700 Arnold-Chiari syndrome without spina bifida or hydrocephalus: Secondary | ICD-10-CM | POA: Diagnosis not present

## 2020-09-27 DIAGNOSIS — T8451XS Infection and inflammatory reaction due to internal right hip prosthesis, sequela: Secondary | ICD-10-CM | POA: Diagnosis not present

## 2020-10-01 DIAGNOSIS — T8451XS Infection and inflammatory reaction due to internal right hip prosthesis, sequela: Secondary | ICD-10-CM | POA: Diagnosis not present

## 2020-10-01 DIAGNOSIS — K219 Gastro-esophageal reflux disease without esophagitis: Secondary | ICD-10-CM | POA: Diagnosis not present

## 2020-10-01 DIAGNOSIS — Q0700 Arnold-Chiari syndrome without spina bifida or hydrocephalus: Secondary | ICD-10-CM | POA: Diagnosis not present

## 2020-10-01 DIAGNOSIS — M25551 Pain in right hip: Secondary | ICD-10-CM | POA: Diagnosis not present

## 2020-10-01 DIAGNOSIS — G43819 Other migraine, intractable, without status migrainosus: Secondary | ICD-10-CM | POA: Diagnosis not present

## 2020-10-01 DIAGNOSIS — G8929 Other chronic pain: Secondary | ICD-10-CM | POA: Diagnosis not present

## 2020-10-01 DIAGNOSIS — M545 Low back pain, unspecified: Secondary | ICD-10-CM | POA: Diagnosis not present

## 2020-10-01 DIAGNOSIS — G4733 Obstructive sleep apnea (adult) (pediatric): Secondary | ICD-10-CM | POA: Diagnosis not present

## 2020-10-01 DIAGNOSIS — E2839 Other primary ovarian failure: Secondary | ICD-10-CM | POA: Diagnosis not present

## 2020-10-02 DIAGNOSIS — G8929 Other chronic pain: Secondary | ICD-10-CM | POA: Diagnosis not present

## 2020-10-02 DIAGNOSIS — M25551 Pain in right hip: Secondary | ICD-10-CM | POA: Diagnosis not present

## 2020-10-02 DIAGNOSIS — G43819 Other migraine, intractable, without status migrainosus: Secondary | ICD-10-CM | POA: Diagnosis not present

## 2020-10-02 DIAGNOSIS — G4733 Obstructive sleep apnea (adult) (pediatric): Secondary | ICD-10-CM | POA: Diagnosis not present

## 2020-10-02 DIAGNOSIS — K219 Gastro-esophageal reflux disease without esophagitis: Secondary | ICD-10-CM | POA: Diagnosis not present

## 2020-10-02 DIAGNOSIS — M545 Low back pain, unspecified: Secondary | ICD-10-CM | POA: Diagnosis not present

## 2020-10-02 DIAGNOSIS — Q0700 Arnold-Chiari syndrome without spina bifida or hydrocephalus: Secondary | ICD-10-CM | POA: Diagnosis not present

## 2020-10-02 DIAGNOSIS — E2839 Other primary ovarian failure: Secondary | ICD-10-CM | POA: Diagnosis not present

## 2020-10-02 DIAGNOSIS — T8451XS Infection and inflammatory reaction due to internal right hip prosthesis, sequela: Secondary | ICD-10-CM | POA: Diagnosis not present

## 2020-10-03 DIAGNOSIS — M6281 Muscle weakness (generalized): Secondary | ICD-10-CM | POA: Diagnosis not present

## 2020-10-03 DIAGNOSIS — M7989 Other specified soft tissue disorders: Secondary | ICD-10-CM | POA: Diagnosis not present

## 2020-10-03 DIAGNOSIS — M19032 Primary osteoarthritis, left wrist: Secondary | ICD-10-CM | POA: Diagnosis not present

## 2020-10-03 DIAGNOSIS — M6289 Other specified disorders of muscle: Secondary | ICD-10-CM | POA: Diagnosis not present

## 2020-10-03 DIAGNOSIS — M1611 Unilateral primary osteoarthritis, right hip: Secondary | ICD-10-CM | POA: Diagnosis not present

## 2020-10-03 DIAGNOSIS — M2550 Pain in unspecified joint: Secondary | ICD-10-CM | POA: Diagnosis not present

## 2020-10-03 DIAGNOSIS — M5136 Other intervertebral disc degeneration, lumbar region: Secondary | ICD-10-CM | POA: Diagnosis not present

## 2020-10-03 DIAGNOSIS — M19041 Primary osteoarthritis, right hand: Secondary | ICD-10-CM | POA: Diagnosis not present

## 2020-10-03 DIAGNOSIS — R700 Elevated erythrocyte sedimentation rate: Secondary | ICD-10-CM | POA: Diagnosis not present

## 2020-10-03 DIAGNOSIS — M19042 Primary osteoarthritis, left hand: Secondary | ICD-10-CM | POA: Diagnosis not present

## 2020-10-03 DIAGNOSIS — M62838 Other muscle spasm: Secondary | ICD-10-CM | POA: Diagnosis not present

## 2020-10-05 ENCOUNTER — Other Ambulatory Visit: Payer: Self-pay

## 2020-10-05 DIAGNOSIS — E2839 Other primary ovarian failure: Secondary | ICD-10-CM | POA: Diagnosis not present

## 2020-10-05 DIAGNOSIS — T8451XS Infection and inflammatory reaction due to internal right hip prosthesis, sequela: Secondary | ICD-10-CM | POA: Diagnosis not present

## 2020-10-05 DIAGNOSIS — G4733 Obstructive sleep apnea (adult) (pediatric): Secondary | ICD-10-CM | POA: Diagnosis not present

## 2020-10-05 DIAGNOSIS — K219 Gastro-esophageal reflux disease without esophagitis: Secondary | ICD-10-CM | POA: Diagnosis not present

## 2020-10-05 DIAGNOSIS — M25551 Pain in right hip: Secondary | ICD-10-CM | POA: Diagnosis not present

## 2020-10-05 DIAGNOSIS — G8929 Other chronic pain: Secondary | ICD-10-CM | POA: Diagnosis not present

## 2020-10-05 DIAGNOSIS — G43819 Other migraine, intractable, without status migrainosus: Secondary | ICD-10-CM | POA: Diagnosis not present

## 2020-10-05 DIAGNOSIS — M545 Low back pain, unspecified: Secondary | ICD-10-CM | POA: Diagnosis not present

## 2020-10-05 DIAGNOSIS — Q0700 Arnold-Chiari syndrome without spina bifida or hydrocephalus: Secondary | ICD-10-CM | POA: Diagnosis not present

## 2020-10-09 ENCOUNTER — Telehealth: Payer: Self-pay | Admitting: Internal Medicine

## 2020-10-09 DIAGNOSIS — G43819 Other migraine, intractable, without status migrainosus: Secondary | ICD-10-CM | POA: Diagnosis not present

## 2020-10-09 DIAGNOSIS — E2839 Other primary ovarian failure: Secondary | ICD-10-CM | POA: Diagnosis not present

## 2020-10-09 DIAGNOSIS — M25551 Pain in right hip: Secondary | ICD-10-CM | POA: Diagnosis not present

## 2020-10-09 DIAGNOSIS — Q0700 Arnold-Chiari syndrome without spina bifida or hydrocephalus: Secondary | ICD-10-CM | POA: Diagnosis not present

## 2020-10-09 DIAGNOSIS — K219 Gastro-esophageal reflux disease without esophagitis: Secondary | ICD-10-CM | POA: Diagnosis not present

## 2020-10-09 DIAGNOSIS — G8929 Other chronic pain: Secondary | ICD-10-CM | POA: Diagnosis not present

## 2020-10-09 DIAGNOSIS — T8451XS Infection and inflammatory reaction due to internal right hip prosthesis, sequela: Secondary | ICD-10-CM | POA: Diagnosis not present

## 2020-10-09 DIAGNOSIS — M545 Low back pain, unspecified: Secondary | ICD-10-CM | POA: Diagnosis not present

## 2020-10-09 DIAGNOSIS — G4733 Obstructive sleep apnea (adult) (pediatric): Secondary | ICD-10-CM | POA: Diagnosis not present

## 2020-10-09 NOTE — Telephone Encounter (Signed)
Lenette from [facility]  [place], [phone]. Patient's daughter's both have covid and she would like to get the ok from Dr.Scott to do telephone visits with patient.

## 2020-10-09 NOTE — Telephone Encounter (Signed)
Ok given to do telephone visit. Pt was agreeable.

## 2020-10-11 DIAGNOSIS — G4733 Obstructive sleep apnea (adult) (pediatric): Secondary | ICD-10-CM | POA: Diagnosis not present

## 2020-10-19 ENCOUNTER — Telehealth (HOSPITAL_COMMUNITY): Payer: Medicare HMO | Admitting: Psychiatry

## 2020-10-19 ENCOUNTER — Other Ambulatory Visit: Payer: Self-pay

## 2020-10-19 DIAGNOSIS — K219 Gastro-esophageal reflux disease without esophagitis: Secondary | ICD-10-CM | POA: Diagnosis not present

## 2020-10-19 DIAGNOSIS — G4733 Obstructive sleep apnea (adult) (pediatric): Secondary | ICD-10-CM | POA: Diagnosis not present

## 2020-10-19 DIAGNOSIS — G8929 Other chronic pain: Secondary | ICD-10-CM | POA: Diagnosis not present

## 2020-10-19 DIAGNOSIS — M25551 Pain in right hip: Secondary | ICD-10-CM | POA: Diagnosis not present

## 2020-10-19 DIAGNOSIS — F331 Major depressive disorder, recurrent, moderate: Secondary | ICD-10-CM

## 2020-10-19 DIAGNOSIS — M545 Low back pain, unspecified: Secondary | ICD-10-CM | POA: Diagnosis not present

## 2020-10-19 DIAGNOSIS — T8451XS Infection and inflammatory reaction due to internal right hip prosthesis, sequela: Secondary | ICD-10-CM | POA: Diagnosis not present

## 2020-10-19 DIAGNOSIS — G43819 Other migraine, intractable, without status migrainosus: Secondary | ICD-10-CM | POA: Diagnosis not present

## 2020-10-19 DIAGNOSIS — E2839 Other primary ovarian failure: Secondary | ICD-10-CM | POA: Diagnosis not present

## 2020-10-19 DIAGNOSIS — Q0700 Arnold-Chiari syndrome without spina bifida or hydrocephalus: Secondary | ICD-10-CM | POA: Diagnosis not present

## 2020-10-19 NOTE — Progress Notes (Unsigned)
No change in depression  No Ativan  Stop Wellbutrin because no effect   P Start lithium [facility] [address] 12/16 @ 8.45am 20 min

## 2020-10-23 ENCOUNTER — Other Ambulatory Visit (HOSPITAL_COMMUNITY): Payer: Self-pay | Admitting: *Deleted

## 2020-10-23 ENCOUNTER — Telehealth (HOSPITAL_COMMUNITY): Payer: Self-pay | Admitting: *Deleted

## 2020-10-23 DIAGNOSIS — G4733 Obstructive sleep apnea (adult) (pediatric): Secondary | ICD-10-CM | POA: Diagnosis not present

## 2020-10-23 DIAGNOSIS — T8451XS Infection and inflammatory reaction due to internal right hip prosthesis, sequela: Secondary | ICD-10-CM | POA: Diagnosis not present

## 2020-10-23 DIAGNOSIS — G43819 Other migraine, intractable, without status migrainosus: Secondary | ICD-10-CM | POA: Diagnosis not present

## 2020-10-23 DIAGNOSIS — E2839 Other primary ovarian failure: Secondary | ICD-10-CM | POA: Diagnosis not present

## 2020-10-23 DIAGNOSIS — M25551 Pain in right hip: Secondary | ICD-10-CM | POA: Diagnosis not present

## 2020-10-23 DIAGNOSIS — K219 Gastro-esophageal reflux disease without esophagitis: Secondary | ICD-10-CM | POA: Diagnosis not present

## 2020-10-23 DIAGNOSIS — G8929 Other chronic pain: Secondary | ICD-10-CM | POA: Diagnosis not present

## 2020-10-23 DIAGNOSIS — M545 Low back pain, unspecified: Secondary | ICD-10-CM | POA: Diagnosis not present

## 2020-10-23 DIAGNOSIS — Q0700 Arnold-Chiari syndrome without spina bifida or hydrocephalus: Secondary | ICD-10-CM | POA: Diagnosis not present

## 2020-10-23 MED ORDER — LITHIUM CARBONATE 300 MG PO TABS: 300.0000 mg | ORAL_TABLET | Freq: Every day | ORAL | 0 refills | Status: DC

## 2020-10-23 NOTE — Telephone Encounter (Signed)
As per note from Dr Doyne Keel, will start lithium 300 mg bid. Please call her pharmacy for 30 days supply. Thanks

## 2020-10-23 NOTE — Telephone Encounter (Signed)
Pt called stating that the "new medication" that was supposed to be prescribed during pt visit on 10/19/20 has not been sent to her pharmacy. Per note pt was to d/c Wellbutrin and start Lithium. Please review and advise.

## 2020-10-24 MED ORDER — BUPROPION HCL ER (XL) 300 MG PO TB24: 300.0000 mg | ORAL_TABLET | ORAL | 0 refills | Status: DC

## 2020-10-25 ENCOUNTER — Other Ambulatory Visit: Payer: Self-pay

## 2020-10-25 ENCOUNTER — Ambulatory Visit (INDEPENDENT_AMBULATORY_CARE_PROVIDER_SITE_OTHER): Payer: Medicare HMO | Admitting: Dermatology

## 2020-10-25 DIAGNOSIS — L309 Dermatitis, unspecified: Secondary | ICD-10-CM

## 2020-10-25 DIAGNOSIS — L258 Unspecified contact dermatitis due to other agents: Secondary | ICD-10-CM | POA: Diagnosis not present

## 2020-10-25 DIAGNOSIS — D2371 Other benign neoplasm of skin of right lower limb, including hip: Secondary | ICD-10-CM | POA: Diagnosis not present

## 2020-10-25 DIAGNOSIS — B079 Viral wart, unspecified: Secondary | ICD-10-CM

## 2020-10-25 DIAGNOSIS — L308 Other specified dermatitis: Secondary | ICD-10-CM

## 2020-10-25 DIAGNOSIS — L820 Inflamed seborrheic keratosis: Secondary | ICD-10-CM | POA: Diagnosis not present

## 2020-10-25 DIAGNOSIS — D239 Other benign neoplasm of skin, unspecified: Secondary | ICD-10-CM

## 2020-10-25 MED ORDER — CLOBETASOL PROPIONATE 0.05 % EX FOAM: CUTANEOUS | 2 refills | Status: DC

## 2020-10-25 NOTE — Progress Notes (Signed)
Follow-Up Visit   Subjective  Kathleen Everett is a 49 y.o. female who presents for the following: Follow-up (Follow up on warts, isks, and hand dermatitis. Patient states today she has rash on right wrist area from thrive patch she would like looked at. ).  Warts on hand treated 4 weeks ago with LN2. Patient also had Isk on medial chest treated with LN2.   Following up on dermatitis on bilateral hands medication using hydroxyzine 25 mg, clobetasol ointment (prescribed clobetasol foam but never picked up), and Eucrisa.   The following portions of the chart were reviewed this encounter and updated as appropriate:  Tobacco  Allergies  Meds  Problems  Med Hx  Surg Hx  Fam Hx      Objective  Well appearing patient in no apparent distress; mood and affect are within normal limits.  A focused examination was performed including hands, arms, right leg, and chest . Relevant physical exam findings are noted in the Assessment and Plan.  Objective  bilateral hands: Few small scaly pink plaques  Objective  Left ventral Forearm: Well demarcated thin red plaque   Objective  Right Lower Leg - Anterior: Firm pink/brown papule nodule with dimple sign.   Objective  left hand x 20 and right hand 13 (33): Verrucous papules  Objective  Chest - Medial [facility]): clear  Assessment & Plan  Dermatitis bilateral hands  Hand dermatitis. Chronic condition, no cure, only control. Not currently at goal.    Restart clobetasol ointment on hands twice a day up to two weeks and then weekends only, using Eucrisa twice a day as needed on weekdays. Avoid applying to face, groin, and axilla. Use as directed. Risk of skin atrophy with long-term use reviewed.    Topical steroids (such as triamcinolone, fluocinolone, fluocinonide, mometasone, clobetasol, halobetasol, betamethasone, hydrocortisone) can cause thinning and lightening of the skin if they are used for too long in the same area. Your  physician has selected the right strength medicine for your problem and area affected on the body. Please use your medication only as directed by your physician to prevent side effects.   Recommend gentle/hydrating skin care.   Contact dermatitis due to other agent, unspecified contact dermatitis type Left ventral Forearm  Allergic vs irritant  Secondary to thrive patch  Start clobetasol ointment twice a day as needed up to 3 weeks Avoid applying to face, groin, and axilla. Use as directed. Risk of skin atrophy with long-term use reviewed.   Discussed patch testing if issue is not resolved.    Dermatofibroma Right Lower Leg - Anterior  Improved s/p cryotherapy, no longer symptomatic  Benign-appearing.  Observation.  Call clinic for new or changing lesions.    Viral warts, unspecified type (33) left hand x 20 and right hand 13  Cantharidin is a blistering agent that comes from a beetle.  It needs to be washed off in about 4 hours after application.  Although it is painless when applied in office, it may cause symptoms of mild pain and burning several hours later.  Treated areas will swell and turn red, and blisters may form.  Vaseline and a bandaid may be applied until wound has healed.  Once healed, the skin may remain temporarily discolored.  It can take weeks to months for pigmentation to return to normal.   Discussed viral etiology and risk of spread.  Discussed multiple treatments may be required to clear warts.  Discussed possible post-treatment dyspigmentation and risk of recurrence.  Destruction of lesion - left hand x 20 and right hand 13 Complexity: simple   Destruction method: chemical removal   Informed consent: discussed and consent obtained   Timeout:  patient name, date of birth, surgical site, and procedure verified Chemical destruction method: cantharidin   Chemical destruction method comment:  Plus Application time:  4 hours Procedure instructions: patient  instructed to wash and dry area   Outcome: patient tolerated procedure well with no complications   Post-procedure details: wound care instructions given    Inflamed seborrheic keratosis Chest - Medial (Center)  Resolved s/p cryo   Hand dermatitis  Reordered Medications clobetasol (OLUX) 0.05 % topical foam  Other Related Medications Crisaborole (EUCRISA) 2 % OINT hydrOXYzine (ATARAX/VISTARIL) 25 MG tablet  Return in about 6 weeks (around 12/06/2020) for Wart and dermatitis .   I, Ruthell Rummage, CMA, am acting as scribe for Forest Gleason, MD.  Documentation: I have reviewed the above documentation for accuracy and completeness, and I agree with the above.  Forest Gleason, MD

## 2020-10-25 NOTE — Patient Instructions (Signed)
Cantharidin is a blistering agent that comes from a beetle.  It needs to be washed off in about 4 hours after application.  Although it is painless when applied in office, it may cause symptoms of mild pain and burning several hours later.  Treated areas will swell and turn red, and blisters may form.  Vaseline and a bandaid may be applied until wound has healed.  Once healed, the skin may remain temporarily discolored.  It can take weeks to months for pigmentation to return to normal.  The molluscum may resolve with this topical treatment, but often, additional treatments may be required to clear molluscum.  It is recommended to keep the skin well-moisturized and avoid scratching affected area to help prevent spread of the molluscum.  Instructions for After In-Office Application of Cantharidin  1. This is a strong medicine; please follow ALL instructions.  2. Gently wash off with soap and water in four hours or sooner s directed by your physician.  3. **WARNING** this medicine can cause severe blistering, blood blisters, infection, and/or scarring if it is not washed off as directed.  4. Your progress will be rechecked in 1-2 months; call sooner if there are any questions or problems.  Topical steroids (such as triamcinolone, fluocinolone, fluocinonide, mometasone, clobetasol, halobetasol, betamethasone, hydrocortisone) can cause thinning and lightening of the skin if they are used for too long in the same area. Your physician has selected the right strength medicine for your problem and area affected on the body. Please use your medication only as directed by your physician to prevent side effects.

## 2020-10-26 ENCOUNTER — Encounter: Payer: Self-pay | Admitting: Dermatology

## 2020-10-26 DIAGNOSIS — M19041 Primary osteoarthritis, right hand: Secondary | ICD-10-CM | POA: Diagnosis not present

## 2020-10-26 DIAGNOSIS — M5136 Other intervertebral disc degeneration, lumbar region: Secondary | ICD-10-CM | POA: Diagnosis not present

## 2020-10-26 DIAGNOSIS — M1611 Unilateral primary osteoarthritis, right hip: Secondary | ICD-10-CM | POA: Diagnosis not present

## 2020-10-26 DIAGNOSIS — M19042 Primary osteoarthritis, left hand: Secondary | ICD-10-CM | POA: Diagnosis not present

## 2020-10-30 DIAGNOSIS — M7062 Trochanteric bursitis, left hip: Secondary | ICD-10-CM | POA: Diagnosis not present

## 2020-10-30 DIAGNOSIS — M25551 Pain in right hip: Secondary | ICD-10-CM | POA: Diagnosis not present

## 2020-10-30 DIAGNOSIS — M5441 Lumbago with sciatica, right side: Secondary | ICD-10-CM | POA: Diagnosis not present

## 2020-10-30 DIAGNOSIS — M47816 Spondylosis without myelopathy or radiculopathy, lumbar region: Secondary | ICD-10-CM | POA: Diagnosis not present

## 2020-10-30 DIAGNOSIS — M160 Bilateral primary osteoarthritis of hip: Secondary | ICD-10-CM | POA: Diagnosis not present

## 2020-10-30 DIAGNOSIS — G2581 Restless legs syndrome: Secondary | ICD-10-CM | POA: Diagnosis not present

## 2020-10-30 DIAGNOSIS — G894 Chronic pain syndrome: Secondary | ICD-10-CM | POA: Diagnosis not present

## 2020-10-30 DIAGNOSIS — Z79899 Other long term (current) drug therapy: Secondary | ICD-10-CM | POA: Diagnosis not present

## 2020-10-30 DIAGNOSIS — M5137 Other intervertebral disc degeneration, lumbosacral region: Secondary | ICD-10-CM | POA: Diagnosis not present

## 2020-10-30 DIAGNOSIS — M7918 Myalgia, other site: Secondary | ICD-10-CM | POA: Diagnosis not present

## 2020-10-30 DIAGNOSIS — M62830 Muscle spasm of back: Secondary | ICD-10-CM | POA: Diagnosis not present

## 2020-10-30 DIAGNOSIS — M5442 Lumbago with sciatica, left side: Secondary | ICD-10-CM | POA: Diagnosis not present

## 2020-11-02 ENCOUNTER — Other Ambulatory Visit: Payer: Self-pay

## 2020-11-02 ENCOUNTER — Telehealth (HOSPITAL_COMMUNITY): Payer: Medicare HMO | Admitting: Psychiatry

## 2020-11-02 DIAGNOSIS — F411 Generalized anxiety disorder: Secondary | ICD-10-CM

## 2020-11-02 DIAGNOSIS — F331 Major depressive disorder, recurrent, moderate: Secondary | ICD-10-CM

## 2020-11-02 MED ORDER — LITHIUM CARBONATE 300 MG PO TABS: 300.0000 mg | ORAL_TABLET | Freq: Every day | ORAL | 1 refills | Status: DC

## 2020-11-02 NOTE — Progress Notes (Unsigned)
Virtual Visit via Telephone Note  I connected with [name] on 11/02/20 at  8:45 AM EST by telephone and verified that I am speaking with the correct person using two identifiers.  Location: Patient: home Provider: office   I discussed the limitations, risks, security and privacy concerns of performing an evaluation and management service by telephone and the availability of in person appointments. I also discussed with the patient that there may be a patient responsible charge related to this service. The patient expressed understanding and agreed to proceed.   History of Present Illness: Kathleen Everett started Lithium about one week ago. She is tolerating the Lithium without any SE. Overall no changes in depression or anxiety. Her stress tolerance remains low. She denies SI/HI.    Observations/Objective:  General Appearance: unable to assess  Eye Contact:  unable to assess  Speech:  {Speech:22685}  Volume:  {Volume (PAA):22686}  Mood:  {BHH MOOD:22306}  Affect:  {Affect (PAA):22687}  Thought Process:  {Thought Process (PAA):22688}  Orientation:  {BHH ORIENTATION (PAA):22689}  Thought Content:  {Thought Content:22690}  Suicidal Thoughts:  {ST/HT (PAA):22692}  Homicidal Thoughts:  {ST/HT (PAA):22692}  Memory:  {BHH MEMORY:22881}  Judgement:  {Judgement (PAA):22694}  Insight:  {Insight (PAA):22695}  Psychomotor Activity: unable to assess  Concentration:  {Concentration:21399}  Recall:  {BHH GOOD/FAIR/POOR:22877}  Fund of Knowledge:  {BHH GOOD/FAIR/POOR:22877}  Language:  {BHH GOOD/FAIR/POOR:22877}  Akathisia:  unable to assess  Handed:  {Handed:22697}  AIMS (if indicated):     Assets:  {Assets (PAA):22698}  ADL's:  unable to assess  Cognition:  {chl bhh cognition:304700322}  Sleep:         Assessment and Plan:  MDD (major depressive disorder), recurrent episode, moderate (HCC) - Plan: lithium 300 MG tablet  Generalized anxiety disorder    Continue Lithium 300mg   po qD  Follow Up Instructions: In 4 weeks or sooner if needed   I discussed the assessment and treatment plan with the patient. The patient was provided an opportunity to ask questions and all were answered. The patient agreed with the plan and demonstrated an understanding of the instructions.   The patient was advised to call back or seek an in-person evaluation if the symptoms worsen or if the condition fails to improve as anticipated.  I provided 5 minutes of non-face-to-face time during this encounter.   Charlcie Cradle, MD

## 2020-11-15 ENCOUNTER — Telehealth: Payer: Self-pay | Admitting: Internal Medicine

## 2020-11-15 NOTE — Telephone Encounter (Signed)
Rejection Reason - Patient did not respond - 3rd attempt to reach pt.to schedule consult. Mailbox full. Closing referral due to protocol./TM" [facility] said on Nov 08, 2020 11:38 AM  "3rd attempt- Attempted to reach pt.to schedule consult. Mailbox full. Closing referral due to protocol./TM" [facility] at [facility] said on Nov 08, 2020 11:37 AM  "Attempted to reach pt.to schedule consult. Mailbox full/TM" [facility] at [facility] said on Aug 07, 2020 11:48 AM

## 2020-11-28 ENCOUNTER — Other Ambulatory Visit: Payer: Self-pay

## 2020-11-28 DIAGNOSIS — L308 Other specified dermatitis: Secondary | ICD-10-CM

## 2020-11-28 MED ORDER — CLOBETASOL PROPIONATE 0.05 % EX OINT: TOPICAL_OINTMENT | CUTANEOUS | 2 refills | Status: DC

## 2020-11-29 ENCOUNTER — Other Ambulatory Visit: Payer: Self-pay | Admitting: Internal Medicine

## 2020-11-30 ENCOUNTER — Telehealth (INDEPENDENT_AMBULATORY_CARE_PROVIDER_SITE_OTHER): Payer: Medicare HMO | Admitting: Psychiatry

## 2020-11-30 ENCOUNTER — Other Ambulatory Visit: Payer: Self-pay

## 2020-11-30 DIAGNOSIS — F411 Generalized anxiety disorder: Secondary | ICD-10-CM

## 2020-11-30 DIAGNOSIS — F331 Major depressive disorder, recurrent, moderate: Secondary | ICD-10-CM

## 2020-11-30 MED ORDER — SERTRALINE HCL 100 MG PO TABS: 100.0000 mg | ORAL_TABLET | Freq: Every day | ORAL | 2 refills | Status: DC

## 2020-11-30 NOTE — Progress Notes (Signed)
Virtual Visit via Telephone Note  I connected with Kathleen Everett on 11/30/20 at  8:45 AM EST by telephone and verified that I am speaking with the correct person using two identifiers.  Location: Patient: home Provider: office   I discussed the limitations, risks, security and privacy concerns of performing an evaluation and management service by telephone and the availability of in person appointments. I also discussed with the patient that there may be a patient responsible charge related to this service. The patient expressed understanding and agreed to proceed.   History of Present Illness: Kathleen Everett shares that her depression is unchanged. She still feels sad and is crying a lot. She is irritable and her frustration tolerance is low. Everything feels out of her control.  She denies SI/HI. With Lithium she has constipation. Her sleep is poor. She wakes up early with racing thoughts.  [name] is struggling with her poor memory and difficulty with word finding. She notes a worsening tremor with her head. It could be due to get Chiari malformation. She has an appointment with her neurologist next week.      Observations/Objective:  General Appearance: unable to assess  Eye Contact:  unable to assess  Speech:  Clear and Coherent and Normal Rate  Volume:  Normal  Mood:  Anxious and Depressed  Affect:  Congruent  Thought Process:  Goal Directed, Linear and Descriptions of Associations: Intact  Orientation:  Full (Time, Place, and Person)  Thought Content:  Logical  Suicidal Thoughts:  No  Homicidal Thoughts:  No  Memory:  Immediate;   Good  Judgement:  Good  Insight:  Good  Psychomotor Activity: unable to assess  Concentration:  Concentration: Good  Recall:  Good  Fund of Knowledge:  Good  Language:  Good  Akathisia:  unable to assess  Handed:  Right  AIMS (if indicated):     Assets:  Communication Skills Desire for Improvement Financial  Resources/Insurance Housing Social Support Talents/Skills Transportation Vocational/Educational  ADL's:  unable to assess  Cognition:  WNL  Sleep:         Assessment and Plan:  MDD- recurrent, moderate; GAD  D/C Lithium   Start Zoloft 50mg  po qD for 5 days and then increase to 100mg  po qD -both mom and dad have had a good response to Zoloft.   Follow Up Instructions: In 6 weeks or sooner if needed   I discussed the assessment and treatment plan with the patient. The patient was provided an opportunity to ask questions and all were answered. The patient agreed with the plan and demonstrated an understanding of the instructions.   The patient was advised to call back or seek an in-person evaluation if the symptoms worsen or if the condition fails to improve as anticipated.  I provided 17 minutes of non-face-to-face time during this encounter.   Charlcie Cradle, MD

## 2020-12-05 DIAGNOSIS — G43019 Migraine without aura, intractable, without status migrainosus: Secondary | ICD-10-CM | POA: Diagnosis not present

## 2020-12-05 DIAGNOSIS — Z9989 Dependence on other enabling machines and devices: Secondary | ICD-10-CM | POA: Diagnosis not present

## 2020-12-05 DIAGNOSIS — R4189 Other symptoms and signs involving cognitive functions and awareness: Secondary | ICD-10-CM | POA: Diagnosis not present

## 2020-12-05 DIAGNOSIS — G4733 Obstructive sleep apnea (adult) (pediatric): Secondary | ICD-10-CM | POA: Diagnosis not present

## 2020-12-06 ENCOUNTER — Ambulatory Visit: Payer: Medicare HMO | Admitting: Dermatology

## 2020-12-06 ENCOUNTER — Encounter: Payer: Self-pay | Admitting: Dermatology

## 2020-12-06 ENCOUNTER — Other Ambulatory Visit: Payer: Self-pay

## 2020-12-06 DIAGNOSIS — L308 Other specified dermatitis: Secondary | ICD-10-CM

## 2020-12-06 DIAGNOSIS — L820 Inflamed seborrheic keratosis: Secondary | ICD-10-CM

## 2020-12-06 DIAGNOSIS — B078 Other viral warts: Secondary | ICD-10-CM | POA: Diagnosis not present

## 2020-12-06 MED ORDER — FLUOROURACIL 5 % EX CREA: TOPICAL_CREAM | Freq: Two times a day (BID) | CUTANEOUS | 0 refills | Status: DC

## 2020-12-06 NOTE — Patient Instructions (Signed)
Cryotherapy Aftercare  . Wash gently with soap and water everyday.   . Apply Vaseline and Band-Aid daily until healed.  Prior to procedure, discussed risks of blister formation, small wound, skin dyspigmentation, or rare scar following cryotherapy.   

## 2020-12-06 NOTE — Progress Notes (Signed)
   Follow-Up Visit   Subjective  Kathleen Everett is a 50 y.o. female who presents for the following: Follow-up (Patient here today for 6 week warts and dermatitis at the hands follow up. She does not want to treat warts in office today since it takes too long, previously they have been treated with cantharidin plus.  They have been present since she was a teenager.  They spread and are sometimes irritated.  She is using clobetasol ointment for irritation in her hands with improvement. Patient advises she does have 2 irritated spots at right temple and nose. ).  The following portions of the chart were reviewed this encounter and updated as appropriate:   Tobacco  Allergies  Meds  Problems  Med Hx  Surg Hx  Fam Hx      Review of Systems:  No other skin or systemic complaints except as noted in HPI or Assessment and Plan.  Objective  Well appearing patient in no apparent distress; mood and affect are within normal limits.  A focused examination was performed including face, hands. Relevant physical exam findings are noted in the Assessment and Plan.  Objective  Bilateral hands: Verrucous papules  Objective  R temple x 1, L dorsal nose x 1 (2): Erythematous keratotic or waxy stuck-on papule or plaque.    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 Bothersome and intermittently symptomatic. Not at goal. S/p therapy with cantharone plus with improvement but not clearance.  Start prescription 5-fluorouracil cream twice daily to affected areas at hands and cover with band aid.  Reviewed expected reaction with inflammation, scale, and irritation.  If too much irritation or pain, pause treatment until it improves and then restart less often.  Advised to use this to limited areas only.  Discussed viral etiology and risk of spread.  Discussed multiple treatments may be required to clear warts.   Ordered Medications: fluorouracil (EFUDEX) 5  % cream  Inflamed seborrheic keratosis (2) R temple x 1, L dorsal nose x 1  Prior to procedure, discussed risks of blister formation, small wound, skin dyspigmentation, or rare scar following cryotherapy.    Destruction of lesion - R temple x 1, L dorsal nose x 1 Complexity: simple   Destruction method: cryotherapy   Informed consent: discussed and consent obtained   Lesion destroyed using liquid nitrogen: Yes   Cryotherapy cycles:  2 Outcome: patient tolerated procedure well with no complications   Post-procedure details: wound care instructions given    Hand Dermatitis - Well-controlled currently - Continue gentle skin care - Continue clobetasol 0.05% ointment twice a day as needed. Avoid applying to face, groin, and axilla. Use as directed. Risk of skin atrophy with long-term use reviewed.   Return in about 2 months (around 02/03/2021) for ISK and wart follow up.  Graciella Belton, RMA, am acting as scribe for Forest Gleason, MD .   Documentation: I have reviewed the above documentation for accuracy and completeness, and I agree with the above.  Forest Gleason, MD

## 2020-12-13 ENCOUNTER — Encounter: Payer: Self-pay | Admitting: Psychology

## 2020-12-18 ENCOUNTER — Other Ambulatory Visit: Payer: Self-pay | Admitting: Internal Medicine

## 2020-12-18 ENCOUNTER — Encounter: Payer: Self-pay | Admitting: Internal Medicine

## 2020-12-19 ENCOUNTER — Other Ambulatory Visit: Payer: Medicare HMO

## 2020-12-19 NOTE — Telephone Encounter (Signed)
Appt cancelled

## 2020-12-20 DIAGNOSIS — M5418 Radiculopathy, sacral and sacrococcygeal region: Secondary | ICD-10-CM | POA: Diagnosis not present

## 2020-12-20 DIAGNOSIS — F419 Anxiety disorder, unspecified: Secondary | ICD-10-CM | POA: Diagnosis not present

## 2020-12-21 ENCOUNTER — Telehealth (INDEPENDENT_AMBULATORY_CARE_PROVIDER_SITE_OTHER): Payer: Medicare HMO | Admitting: Internal Medicine

## 2020-12-21 DIAGNOSIS — F320 Major depressive disorder, single episode, mild: Secondary | ICD-10-CM

## 2020-12-21 DIAGNOSIS — R739 Hyperglycemia, unspecified: Secondary | ICD-10-CM | POA: Diagnosis not present

## 2020-12-21 DIAGNOSIS — M545 Low back pain, unspecified: Secondary | ICD-10-CM

## 2020-12-21 DIAGNOSIS — Z96641 Presence of right artificial hip joint: Secondary | ICD-10-CM | POA: Diagnosis not present

## 2020-12-21 DIAGNOSIS — G43809 Other migraine, not intractable, without status migrainosus: Secondary | ICD-10-CM | POA: Diagnosis not present

## 2020-12-21 DIAGNOSIS — E7800 Pure hypercholesterolemia, unspecified: Secondary | ICD-10-CM

## 2020-12-21 DIAGNOSIS — F439 Reaction to severe stress, unspecified: Secondary | ICD-10-CM

## 2020-12-21 DIAGNOSIS — K219 Gastro-esophageal reflux disease without esophagitis: Secondary | ICD-10-CM | POA: Diagnosis not present

## 2020-12-21 DIAGNOSIS — G8929 Other chronic pain: Secondary | ICD-10-CM

## 2020-12-21 DIAGNOSIS — G4733 Obstructive sleep apnea (adult) (pediatric): Secondary | ICD-10-CM | POA: Diagnosis not present

## 2020-12-21 DIAGNOSIS — D649 Anemia, unspecified: Secondary | ICD-10-CM

## 2020-12-21 DIAGNOSIS — Q0700 Arnold-Chiari syndrome without spina bifida or hydrocephalus: Secondary | ICD-10-CM

## 2020-12-21 DIAGNOSIS — K649 Unspecified hemorrhoids: Secondary | ICD-10-CM

## 2020-12-21 DIAGNOSIS — F32A Depression, unspecified: Secondary | ICD-10-CM

## 2020-12-21 NOTE — Progress Notes (Signed)
Patient ID: Kathleen Everett, female   DOB: 09/15/1971, 50 y.o.   MRN: [id number]   Virtual Visit via video Note  This visit type was conducted due to national recommendations for restrictions regarding the COVID-19 pandemic (e.g. social distancing).  This format is felt to be most appropriate for this patient at this time.  All issues noted in this document were discussed and addressed.  No physical exam was performed (except for noted visual exam findings with Video Visits).   I connected with Armenta Erskin by a video enabled telemedicine application and verified that I am speaking with the correct person using two identifiers. Location patient: home Location provider: work  Persons participating in the virtual visit: patient, provider  The limitations, risks, security and privacy concerns of performing an evaluation and management service by video and the availability of in person appointments have been discussed.  It has also been discussed with the patient that there may be a patient responsible charge related to this service. The patient expressed understanding and agreed to proceed.   Reason for visit: follow up appt  HPI: Follow up regarding her blood pressure, cholesterol and headaches.  Seeing neurology and diagnosed with migraine headaches. Also has history of chiari malformation.  Started on candesartan.  Just started this past week.  Headaches overall have improved.  May occur several times per month, but overall improved.  Has medicine to take to abort headache if needed.  Discussed memory loss.  Being referred for neuropsych testing.  Has decreased her coffee intake.  No chest pain or sob reported. No increased cough or congestion reported.  Does report indigestion - described as burning.  Increased burping.  nexium helps.  Break through symptoms.  Discussed increasing nexium to bid dosing.  Is scheduled for f/u with GI to discuss colonoscopy.  Discussed possible need for EGD.   Has gained weight.  No abdominal pain.  Has had problems with bleeding hemorrhoids.  Persistent intermittent bleeding.  No significant problems with constipation.  Wants evaluation for hemorrhoids.  Seeing psychiatry.  On zoloft.  Being followed by pain clinic.  S/p recent procedure.  Increased pain with standing for any length of time.  Has started walking some.  Also reports some nasal/sinus congestion - blowing clear mucus.  Taking advil cold and sinus.     ROS: See pertinent positives and negatives per HPI.  Past Medical History:  Diagnosis Date  . Anxiety   . Back pain   . Chiari malformation    s/p sgy decompression  . Chronic tension headaches   . Complication of anesthesia   . DDD (degenerative disc disease), lumbar   . Depression   . Diverticulitis   . Dysmenorrhea   . Dysplastic nevus 10/09/2015   R abdomen - moderate, limited margins free.  . Endometriosis   . Gas pain   . GERD (gastroesophageal reflux disease)   . Hidradenitis   . Hyperlipidemia   . Mitral valve prolapse   . Obesity   . Osteoarthritis   . Ovarian cyst    hx  . PONV (postoperative nausea and vomiting)   . Sleep apnea    central sleep apnea  No CPAP right now  . TMJ (dislocation of temporomandibular joint)   . Vitamin B12 deficiency    On injections    Past Surgical History:  Procedure Laterality Date  . ABDOMINAL HYSTERECTOMY  03/27/07   laparoscopic  . BREAST EXCISIONAL BIOPSY Left 2004   fibroadenoma  . CESAREAN SECTION  1999/2004 twins  . chiari decompression    . COLONOSCOPY WITH PROPOFOL N/A 04/28/2015   Procedure: COLONOSCOPY WITH PROPOFOL;  Surgeon: Manya Silvas, MD;  Location: [facility] ENDOSCOPY;  Service: Endoscopy;  Laterality: N/A;  . CYSTOSCOPY     bladder ulcers  . ESOPHAGOGASTRODUODENOSCOPY N/A 04/28/2015   Procedure: ESOPHAGOGASTRODUODENOSCOPY (EGD);  Surgeon: Manya Silvas, MD;  Location: [facility] ENDOSCOPY;  Service: Endoscopy;  Laterality: N/A;  . HERNIA REPAIR Right  2003   inguinal  . INCISION AND DRAINAGE HIP Right 05/11/2020   Procedure: IRRIGATION AND DEBRIDEMENT HIP;  Surgeon: Hessie Knows, MD;  Location: [facility] ORS;  Service: Orthopedics;  Laterality: Right;  . OVARIAN CYST REMOVAL  2001   right  . TEMPOROMANDIBULAR JOINT SURGERY  2000  . TOTAL HIP ARTHROPLASTY Right 12/28/2019   Procedure: TOTAL HIP ARTHROPLASTY ANTERIOR APPROACH;  Surgeon: Hessie Knows, MD;  Location: [facility] ORS;  Service: Orthopedics;  Laterality: Right;  . TUBAL LIGATION     Bilateral    Family History  Problem Relation Age of Onset  . Hyperlipidemia Mother   . Anxiety disorder Mother   . Atrial fibrillation Mother   . Hypertension Mother   . Anxiety disorder Father   . Stroke Father   . Hyperlipidemia Father   . Hypertension Father   . Congestive Heart Failure Father   . Depression Father   . Hyperlipidemia Brother   . Colon cancer Paternal Aunt   . Dementia Paternal Aunt   . Dementia Maternal Grandmother   . Dementia Paternal Grandmother   . ADD / ADHD Daughter   . Breast cancer Neg Hx     SOCIAL HX: reviewed.    Current Outpatient Medications:  .  candesartan (ATACAND) 4 MG tablet, Take 4 mg by mouth daily., Disp: , Rfl:  .  acetaminophen (TYLENOL) 500 MG tablet, Take 500-1,000 mg by mouth 2 (two) times daily as needed for mild pain. , Disp: , Rfl:  .  AIMOVIG 140 MG/ML SOAJ, , Disp: , Rfl:  .  Ascorbic Acid (VITAMIN C) 500 MG CHEW, Chew 500 mg by mouth daily., Disp: , Rfl:  .  atorvastatin (LIPITOR) 10 MG tablet, Take 1 tablet by mouth once daily, Disp: 30 tablet, Rfl: 0 .  augmented betamethasone dipropionate (DIPROLENE-AF) 0.05 % cream, Apply to hands twice a day as needed. Avoid applying to face, groin, and axilla. Use as directed., Disp: 50 g, Rfl: 0 .  clobetasol (OLUX) 0.05 % topical foam, Twice daily to hands for up to 2 weeks then weekends only. Avoid applying to face, groin, and axilla. Use as directed. Risk of skin atrophy with long-term use  reviewed., Disp: 50 g, Rfl: 2 .  clobetasol ointment (TEMOVATE) 0.05 %, Apply to hands twice a day on weekends only., Disp: 30 g, Rfl: 2 .  Crisaborole (EUCRISA) 2 % OINT, One to two times daily to hands for maintenance., Disp: 60 g, Rfl: 2 .  cyanocobalamin (,VITAMIN B-12,) 1000 MCG/ML injection, INJECT ONE ML (CC) INTRAMUSCULARLY ONCE EVERY MONTH (Patient taking differently: Inject 1,000 mcg into the muscle every 30 (thirty) days.), Disp: 10 mL, Rfl: 1 .  diclofenac sodium (VOLTAREN) 1 % GEL, Apply 1 application topically 4 (four) times daily as needed (pain.). , Disp: , Rfl:  .  esomeprazole (NEXIUM) 40 MG capsule, Take 1 capsule by mouth once daily, Disp: 90 capsule, Rfl: 0 .  estradiol (ESTRACE VAGINAL) 0.1 MG/GM vaginal cream, One applicator q hs x 5 nights and then 2x/week. (Patient taking differently:  Place 1 Applicatorful vaginally 2 (two) times a week.), Disp: 42.5 g, Rfl: 1 .  fluorouracil (EFUDEX) 5 % cream, Apply topically in the morning and at bedtime. To affected areas at hands, Disp: 40 g, Rfl: 0 .  Fremanezumab-vfrm 225 MG/1.5ML SOAJ, Inject 225 mg into the skin. , Disp: , Rfl:  .  furosemide (LASIX) 20 MG tablet, Take 1 tablet (20 mg total) by mouth daily as needed (As needed for leg swelling)., Disp: 90 tablet, Rfl: 3 .  gabapentin (NEURONTIN) 400 MG capsule, Take 400 mg by mouth 3 (three) times daily., Disp: , Rfl:  .  hydrOXYzine (ATARAX/VISTARIL) 25 MG tablet, 1-2 at bedtime as needed for itch, Disp: 60 tablet, Rfl: 1 .  ibuprofen (ADVIL) 800 MG tablet, Take 800 mg by mouth 3 (three) times daily as needed (Pain). , Disp: , Rfl:  .  lactase (RA DAIRY AID) 3000 units tablet, Take 9,000 Units by mouth 3 (three) times daily as needed (with dairy consumption). , Disp: , Rfl:  .  methocarbamol (ROBAXIN) 500 MG tablet, Take 1 tablet (500 mg total) by mouth every 6 (six) hours as needed for muscle spasms. (Patient taking differently: Take 500 mg by mouth 3 (three) times daily.), Disp: 30  tablet, Rfl: 0 .  mupirocin ointment (BACTROBAN) 2 %, Apply 1 application topically daily., Disp: 22 g, Rfl: 0 .  NARCAN 4 MG/0.1ML LIQD nasal spray kit, Place 0.4 mg into the nose once., Disp: , Rfl:  .  NON FORMULARY, Take 2 capsules by mouth daily. Thrive vitamins with shake and derma patch, Disp: , Rfl:  .  nystatin cream (MYCOSTATIN), APPLY TOPICALLY TWICE DAILY, Disp: 30 g, Rfl: 0 .  Oxycodone HCl 10 MG TABS, Take 10 mg by mouth 5 (five) times daily. , Disp: , Rfl: 0 .  potassium chloride (KLOR-CON) 10 MEQ tablet, Take 1 tablet (10 mEq total) by mouth daily as needed (As needed with Furosemide (Lasix))., Disp: 90 tablet, Rfl: 3 .  rizatriptan (MAXALT) 10 MG tablet, Take 10 mg by mouth as needed for migraine. May repeat in 2 hours if needed, Disp: , Rfl:  .  sertraline (ZOLOFT) 100 MG tablet, Take 1 tablet (100 mg total) by mouth daily., Disp: 30 tablet, Rfl: 2 .  Simethicone (GAS RELIEF PO), Take 125 mg by mouth 2 (two) times daily as needed for flatulence. Anti gas, Disp: , Rfl:  .  triamcinolone cream (KENALOG) 0.1 %, APPLY  CREAM EXTERNALLY TWICE DAILY TO AFFECTED AREA ON  FINGERS, Disp: 30 g, Rfl: 0  EXAM:  GENERAL: alert, oriented, appears well and in no acute distress  HEENT: atraumatic, conjunttiva clear, no obvious abnormalities on inspection of external nose and ears  NECK: normal movements of the head and neck  LUNGS: on inspection no signs of respiratory distress, breathing rate appears normal, no obvious gross SOB, gasping or wheezing  CV: no obvious cyanosis  PSYCH/NEURO: pleasant and cooperative, no obvious depression or anxiety, speech and thought processing grossly intact  ASSESSMENT AND PLAN:  Discussed the following assessment and plan:  Problem List Items Addressed This Visit    Anemia    Follow cbc.       Arnold-Chiari malformation (HCC)    S/p surgery.  Being followed by neurology.       Chronic back pain    Followed by pain clinic.        GERD  (gastroesophageal reflux disease)    Increased acid reflux.  On nexium.  Break through  symptoms.  Increase nexium to bid.  Has appt with GI.  D/w GI regarding question of need for EGD.       Headache, migraine    Followed by neurology.  Relatively stable.  Overall improved.        Relevant Medications   candesartan (ATACAND) 4 MG tablet   Hemorrhoids    Persistent intermittent problem with bleeding hemorrhoids.  Request referral.  Will have general surgery evaluate.       Relevant Medications   candesartan (ATACAND) 4 MG tablet   Other Relevant Orders   Ambulatory referral to General Surgery   Hypercholesterolemia    On lipitor.  Low cholesterol diet and exercise.  Follow lipid panel and liver function tests.        Relevant Medications   candesartan (ATACAND) 4 MG tablet   Hyperglycemia    Low carb diet and exercise.  Follow met b and a1c.       Mild depression ([place])    On zoloft.  Followed by psychiatry.  Stable.       Sleep apnea    CPAP.       Status post total hip replacement, right    S/p hip replacement.  Followed by ortho.  Doing well s/p surgery.       Stress    Increased stress with current pain and medical issues.  On zoloft.  Followed by psychiatry.           I discussed the assessment and treatment plan with the patient. The patient was provided an opportunity to ask questions and all were answered. The patient agreed with the plan and demonstrated an understanding of the instructions.   The patient was advised to call back or seek an in-person evaluation if the symptoms worsen or if the condition fails to improve as anticipated.   [name], MD

## 2020-12-23 ENCOUNTER — Encounter: Payer: Self-pay | Admitting: Internal Medicine

## 2020-12-23 DIAGNOSIS — K649 Unspecified hemorrhoids: Secondary | ICD-10-CM

## 2020-12-23 HISTORY — DX: Unspecified hemorrhoids: K64.9

## 2020-12-23 NOTE — Assessment & Plan Note (Signed)
Low carb diet and exercise.  Follow met b and a1c.  

## 2020-12-23 NOTE — Assessment & Plan Note (Signed)
CPAP.  

## 2020-12-23 NOTE — Assessment & Plan Note (Signed)
Followed by pain clinic.  

## 2020-12-23 NOTE — Assessment & Plan Note (Signed)
Persistent intermittent problem with bleeding hemorrhoids.  Request referral.  Will have general surgery evaluate.

## 2020-12-23 NOTE — Assessment & Plan Note (Signed)
Increased stress with current pain and medical issues.  On zoloft.  Followed by psychiatry.

## 2020-12-23 NOTE — Assessment & Plan Note (Signed)
Follow cbc.  

## 2020-12-23 NOTE — Assessment & Plan Note (Signed)
On lipitor.  Low cholesterol diet and exercise.  Follow lipid panel and liver function tests.   

## 2020-12-23 NOTE — Assessment & Plan Note (Signed)
Increased acid reflux.  On nexium.  Break through symptoms.  Increase nexium to bid.  Has appt with GI.  D/w GI regarding question of need for EGD.

## 2020-12-23 NOTE — Assessment & Plan Note (Signed)
Followed by neurology.  Relatively stable.  Overall improved.

## 2020-12-23 NOTE — Assessment & Plan Note (Signed)
S/p surgery.  Being followed by neurology.  

## 2020-12-23 NOTE — Assessment & Plan Note (Signed)
On zoloft.  Followed by psychiatry.  Stable.

## 2020-12-23 NOTE — Assessment & Plan Note (Signed)
S/p hip replacement.  Followed by ortho.  Doing well s/p surgery.

## 2020-12-28 DIAGNOSIS — G44209 Tension-type headache, unspecified, not intractable: Secondary | ICD-10-CM | POA: Diagnosis not present

## 2020-12-28 DIAGNOSIS — F40240 Claustrophobia: Secondary | ICD-10-CM | POA: Diagnosis not present

## 2020-12-28 DIAGNOSIS — Z79899 Other long term (current) drug therapy: Secondary | ICD-10-CM | POA: Diagnosis not present

## 2020-12-28 DIAGNOSIS — F1190 Opioid use, unspecified, uncomplicated: Secondary | ICD-10-CM | POA: Diagnosis not present

## 2020-12-28 DIAGNOSIS — M5412 Radiculopathy, cervical region: Secondary | ICD-10-CM | POA: Diagnosis not present

## 2020-12-28 DIAGNOSIS — M47816 Spondylosis without myelopathy or radiculopathy, lumbar region: Secondary | ICD-10-CM | POA: Diagnosis not present

## 2020-12-28 DIAGNOSIS — R451 Restlessness and agitation: Secondary | ICD-10-CM | POA: Diagnosis not present

## 2020-12-28 DIAGNOSIS — M7062 Trochanteric bursitis, left hip: Secondary | ICD-10-CM | POA: Diagnosis not present

## 2020-12-28 DIAGNOSIS — Z5181 Encounter for therapeutic drug level monitoring: Secondary | ICD-10-CM | POA: Diagnosis not present

## 2020-12-28 DIAGNOSIS — M25551 Pain in right hip: Secondary | ICD-10-CM | POA: Diagnosis not present

## 2021-01-10 ENCOUNTER — Other Ambulatory Visit: Payer: Medicare HMO

## 2021-01-11 ENCOUNTER — Telehealth (INDEPENDENT_AMBULATORY_CARE_PROVIDER_SITE_OTHER): Payer: Medicare HMO | Admitting: Psychiatry

## 2021-01-11 ENCOUNTER — Other Ambulatory Visit: Payer: Self-pay

## 2021-01-11 DIAGNOSIS — F331 Major depressive disorder, recurrent, moderate: Secondary | ICD-10-CM

## 2021-01-11 DIAGNOSIS — F411 Generalized anxiety disorder: Secondary | ICD-10-CM

## 2021-01-11 MED ORDER — SERTRALINE HCL 100 MG PO TABS
100.0000 mg | ORAL_TABLET | Freq: Every day | ORAL | 2 refills | Status: DC
Start: 2021-01-11 — End: 2021-02-08

## 2021-01-11 NOTE — BH Specialist Note (Signed)
Virtual Visit via Telephone Note  I connected with [name] on 01/11/21 at  8:45 AM EST by telephone and verified that I am speaking with the correct person using two identifiers.  Location: Patient: home Provider: office   I discussed the limitations, risks, security and privacy concerns of performing an evaluation and management service by telephone and the availability of in person appointments. I also discussed with the patient that there may be a patient responsible charge related to this service. The patient expressed understanding and agreed to proceed.   History of Present Illness: Kathleen Everett notes that the Zoloft 100mg  is helping. She feels a little happier than before and seem to be happening more often. Her depression is a little less intense. Her crying spells are less often. She is little more motivated and is trying to do more things for personal care. She denies SI/HI. Kathleen Everett is still experiencing a lot of pain that will lead to poor motivation. Her sleep remains poor. Kathleen Everett often has racing thoughts when she lies down at night.  Her frustration tolerance remains poor. Kathleen Everett did follow up with her neurologist in mid January, they are working on her migraines. Her pain doctor wants to do another injection and she is worried that she will gain more weight. She is tolerating the Zoloft well and wants to continue it.    Observations/Objective:  General Appearance: unable to assess  Eye Contact:  unable to assess  Speech:  Clear and Coherent and Normal Rate  Volume:  Normal  Mood:  Anxious and Depressed  Affect:  Full Range  Thought Process:  Goal Directed, Linear and Descriptions of Associations: Intact  Orientation:  Full (Time, Place, and Person)  Thought Content:  Logical  Suicidal Thoughts:  No  Homicidal Thoughts:  No  Memory:  Immediate;   Good  Judgement:  Good  Insight:  Good  Psychomotor Activity: unable to assess  Concentration:   Concentration: Good  Recall:  Good  Fund of Knowledge:  Good  Language:  Good  Akathisia:  unable to assess  Handed:  Right  AIMS (if indicated):     Assets:  Communication Skills Desire for Improvement Financial Resources/Insurance Housing Resilience Social Support Talents/Skills Transportation Vocational/Educational  ADL's:  unable to assess  Cognition:  WNL  Sleep:         Assessment and Plan:  1. MDD (major depressive disorder), recurrent episode, moderate (HCC) - sertraline (ZOLOFT) 100 MG tablet; Take 1 tablet (100 mg total) by mouth daily.  Dispense: 30 tablet; Refill: 2  2. Generalized anxiety disorder - Zoloft 100mg  po qD    Follow Up Instructions: In 1-2 months or sooner if needed   I discussed the assessment and treatment plan with the patient. The patient was provided an opportunity to ask questions and all were answered. The patient agreed with the plan and demonstrated an understanding of the instructions.   The patient was advised to call back or seek an in-person evaluation if the symptoms worsen or if the condition fails to improve as anticipated.  I provided 15 minutes of non-face-to-face time during this encounter.   Charlcie Cradle, MD

## 2021-01-11 NOTE — Progress Notes (Signed)
Virtual Visit via Telephone Note  I connected withLeigh Kandra Everett on 01/11/21 at  8:45 AM EST by telephoneand verified that I am speaking with the correct person using two identifiers.  **see note on 11/30/20. This visit was accidentally added as an addendum to visit on 11/30/20**  Location: Patient: home Provider: office  I discussed the limitations, risks, security and privacy concerns of performing an evaluation and management service by telephone and the availability of in person appointments. I also discussed with the patient that there may be a patient responsible charge related to this service. The patient expressed understanding and agreed to proceed.   History of Present Illness: Kathleen Everett notes that the Zoloft 100mg  is helping. She feels a little happier than before and seem to be happening more often. Her depression is a little less intense. Her crying spells are less often. She is little more motivated and is trying to do more things for personal care. She denies SI/HI. Kathleen Everett is still experiencing a lot of pain that will lead to poor motivation. Her sleep remains poor. Kathleen Everett often has racing thoughts when she lies down at night.  Her frustration tolerance remains poor. Kathleen Everett did follow up with her neurologist in mid January, they are working on her migraines. Her pain doctor wants to do another injection and she is worried that she will gain more weight. She is tolerating the Zoloft well and wants to continue it.   Observations/Objective:  General Appearance: unable to assess  Eye Contact:  unable to assess  Speech:  Clear and Coherent and Normal Rate  Volume:  Normal  Mood:  Anxious and Depressed  Affect:  Full Range  Thought Process:  Goal Directed, Linear and Descriptions of Associations: Intact  Orientation:  Full (Time, Place, and Person)  Thought Content:  Logical  Suicidal Thoughts:  No  Homicidal Thoughts:  No  Memory:  Immediate;   Good   Judgement:  Good  Insight:  Good  Psychomotor Activity: unable to assess  Concentration:  Concentration: Good  Recall:  Good  Fund of Knowledge:  Good  Language:  Good  Akathisia:  unable to assess  Handed:  Right  AIMS (if indicated):     Assets:  Communication Skills Desire for Improvement Financial Resources/Insurance Housing Resilience Social Support Talents/Skills Transportation Vocational/Educational  ADL's:  unable to assess  Cognition:  WNL  Sleep:         Assessment and Plan:  1. MDD (major depressive disorder), recurrent episode, moderate (HCC) - sertraline (ZOLOFT) 100 MG tablet; Take 1 tablet (100 mg total) by mouth daily.  Dispense: 30 tablet; Refill: 2  2. Generalized anxiety disorder - Zoloft 100mg  po qD    Follow Up Instructions: In 1-2 months or sooner if needed  I discussed the assessment and treatment plan with the patient. The patient was provided an opportunity to ask questions and all were answered. The patient agreed with the plan and demonstrated an understanding of the instructions.  The patient was advised to call back or seek an in-person evaluation if the symptoms worsen or if the condition fails to improve as anticipated.  I provided 15 minutes of non-face-to-face time during this encounter.   Kathleen Cradle, MD

## 2021-01-11 NOTE — Addendum Note (Signed)
Addended by: Charlcie Cradle on: 01/11/2021 12:05 PM   Modules accepted: Orders

## 2021-01-15 DIAGNOSIS — Z8371 Family history of colonic polyps: Secondary | ICD-10-CM | POA: Diagnosis not present

## 2021-01-15 DIAGNOSIS — K219 Gastro-esophageal reflux disease without esophagitis: Secondary | ICD-10-CM | POA: Diagnosis not present

## 2021-01-15 DIAGNOSIS — R140 Abdominal distension (gaseous): Secondary | ICD-10-CM | POA: Diagnosis not present

## 2021-01-18 ENCOUNTER — Encounter: Payer: Medicare HMO | Admitting: Psychology

## 2021-01-21 ENCOUNTER — Other Ambulatory Visit: Payer: Self-pay | Admitting: Internal Medicine

## 2021-01-25 ENCOUNTER — Encounter: Payer: Medicare HMO | Admitting: Psychology

## 2021-01-26 ENCOUNTER — Encounter: Payer: Medicare HMO | Admitting: Psychology

## 2021-02-01 ENCOUNTER — Encounter: Payer: Medicare HMO | Admitting: Psychology

## 2021-02-01 DIAGNOSIS — F1190 Opioid use, unspecified, uncomplicated: Secondary | ICD-10-CM | POA: Diagnosis not present

## 2021-02-01 DIAGNOSIS — R451 Restlessness and agitation: Secondary | ICD-10-CM | POA: Diagnosis not present

## 2021-02-01 DIAGNOSIS — G44209 Tension-type headache, unspecified, not intractable: Secondary | ICD-10-CM | POA: Diagnosis not present

## 2021-02-01 DIAGNOSIS — F419 Anxiety disorder, unspecified: Secondary | ICD-10-CM | POA: Diagnosis not present

## 2021-02-01 DIAGNOSIS — M5412 Radiculopathy, cervical region: Secondary | ICD-10-CM | POA: Diagnosis not present

## 2021-02-01 DIAGNOSIS — M25551 Pain in right hip: Secondary | ICD-10-CM | POA: Diagnosis not present

## 2021-02-01 DIAGNOSIS — Z79899 Other long term (current) drug therapy: Secondary | ICD-10-CM | POA: Diagnosis not present

## 2021-02-01 DIAGNOSIS — M7062 Trochanteric bursitis, left hip: Secondary | ICD-10-CM | POA: Diagnosis not present

## 2021-02-01 DIAGNOSIS — M5418 Radiculopathy, sacral and sacrococcygeal region: Secondary | ICD-10-CM | POA: Diagnosis not present

## 2021-02-05 ENCOUNTER — Other Ambulatory Visit: Payer: Self-pay | Admitting: Internal Medicine

## 2021-02-08 ENCOUNTER — Other Ambulatory Visit: Payer: Self-pay

## 2021-02-08 ENCOUNTER — Telehealth (INDEPENDENT_AMBULATORY_CARE_PROVIDER_SITE_OTHER): Payer: Medicare HMO | Admitting: Psychiatry

## 2021-02-08 DIAGNOSIS — F411 Generalized anxiety disorder: Secondary | ICD-10-CM | POA: Diagnosis not present

## 2021-02-08 DIAGNOSIS — F331 Major depressive disorder, recurrent, moderate: Secondary | ICD-10-CM

## 2021-02-08 DIAGNOSIS — K649 Unspecified hemorrhoids: Secondary | ICD-10-CM | POA: Diagnosis not present

## 2021-02-08 MED ORDER — SERTRALINE HCL 100 MG PO TABS
ORAL_TABLET | ORAL | 1 refills | Status: DC
Start: 2021-02-08 — End: 2021-03-22

## 2021-02-08 NOTE — Progress Notes (Signed)
Virtual Visit via Telephone Note  I connected with Kathleen Everett on 02/08/21 at  1:30 PM EDT by telephone and verified that I am speaking with the correct person using two identifiers.  Location: Patient: home Provider: office   I discussed the limitations, risks, security and privacy concerns of performing an evaluation and management service by telephone and the availability of in person appointments. I also discussed with the patient that there may be a patient responsible charge related to this service. The patient expressed understanding and agreed to proceed.   History of Present Illness: Kathleen Everett shares that she is not doing as well as she was a couple of months ago. She is overwhelmed and stressed. It is causing her anxiety to increase. She denies SI/HI but she does have random passive thoughts of death. Kathleen Everett shares that she would never kill herself because she wants to be around for her children.  Kathleen Everett feels restless and has racing thoughts. At night she is not able to relax and her sleep is not restful. Kathleen Everett wakes up multiple times a night. She has some stomach pain and increased acid reflux. Kathleen Everett feels like she is waiting for the next bad thing to happen. Her depression is worse. She feels down most days of the week. Her motivation is poor. At times her concentration is poor and is down on herself. Her crying spells have not worsened as compared to a month before.      Observations/Objective:  General Appearance: unable to assess  Eye Contact:  unable to assess  Speech:  Clear and Coherent and Normal Rate  Volume:  Normal  Mood:  Anxious and Depressed  Affect:  Congruent  Thought Process:  Goal Directed, Linear and Descriptions of Associations: Intact  Orientation:  Full (Time, Place, and Person)  Thought Content:  Logical  Suicidal Thoughts:  No  Homicidal Thoughts:  No  Memory:  Immediate;   Good  Judgement:  Good  Insight:  Good  Psychomotor  Activity: unable to assess  Concentration:  Concentration: Good  Recall:  Good  Fund of Knowledge:  Good  Language:  Good  Akathisia:  unable to assess  Handed:  Right  AIMS (if indicated):     Assets:  Communication Skills Desire for Improvement Financial Resources/Insurance Housing Intimacy Resilience Talents/Skills Transportation Vocational/Educational  ADL's:  unable to assess  Cognition:  WNL  Sleep:         Assessment and Plan:  1. MDD (major depressive disorder), recurrent episode, moderate (HCC) - sertraline (ZOLOFT) 100 MG tablet; Take 1.5 tablets (150 mg total) by mouth daily for 8 days, THEN 2 tablets (200 mg total) daily.  Dispense: 72 tablet; Refill: 1  2. Generalized anxiety disorder - sertraline (ZOLOFT) 100 MG tablet; Take 1.5 tablets (150 mg total) by mouth daily for 8 days, THEN 2 tablets (200 mg total) daily.  Dispense: 72 tablet; Refill: 1  - increase Zoloft to target depression and anxiety  Depression screen [facility] 2/9 02/08/2021 01/11/2021 06/26/2020 02/14/2020 06/19/2018  Decreased Interest 3 1 3 2 3   Down, Depressed, Hopeless 3 2 3 2 3   PHQ - 2 Score 6 3 6 4 6   Altered sleeping 3 3 3 1 3   Tired, decreased energy 3 1 2 1 3   Change in appetite 0 1 2 0 1  Feeling bad or failure about yourself  2 1 3  0 3  Trouble concentrating 2 1 3 1 3   Moving slowly or fidgety/restless  2 0 3 1 1   Suicidal thoughts 1 0 1 0 0  PHQ-9 Score 19 10 23 8 20   Difficult doing work/chores Very difficult Somewhat difficult Extremely dIfficult Somewhat difficult Extremely dIfficult  Some recent data might be hidden    Flowsheet Row Video Visit from 02/08/2021 in [facility] from 11/30/2020 in [facility] Error: Q3, 4, or 5 should not be populated when Q2 is No No Risk       Follow Up Instructions: In 4-8 weeks or sooner if needed   I discussed the assessment and  treatment plan with the patient. The patient was provided an opportunity to ask questions and all were answered. The patient agreed with the plan and demonstrated an understanding of the instructions.   The patient was advised to call back or seek an in-person evaluation if the symptoms worsen or if the condition fails to improve as anticipated.  I provided 17 minutes of non-face-to-face time during this encounter.   Charlcie Cradle, MD

## 2021-02-13 ENCOUNTER — Ambulatory Visit (INDEPENDENT_AMBULATORY_CARE_PROVIDER_SITE_OTHER): Payer: Medicare HMO | Admitting: Dermatology

## 2021-02-13 ENCOUNTER — Other Ambulatory Visit: Payer: Self-pay

## 2021-02-13 DIAGNOSIS — G4733 Obstructive sleep apnea (adult) (pediatric): Secondary | ICD-10-CM | POA: Diagnosis not present

## 2021-02-13 DIAGNOSIS — L309 Dermatitis, unspecified: Secondary | ICD-10-CM

## 2021-02-13 DIAGNOSIS — B078 Other viral warts: Secondary | ICD-10-CM | POA: Diagnosis not present

## 2021-02-13 DIAGNOSIS — L821 Other seborrheic keratosis: Secondary | ICD-10-CM

## 2021-02-13 MED ORDER — EUCRISA 2 % EX OINT: TOPICAL_OINTMENT | CUTANEOUS | 2 refills | Status: DC

## 2021-02-13 MED ORDER — CLOBETASOL PROPIONATE 0.05 % EX OINT: TOPICAL_OINTMENT | CUTANEOUS | 2 refills | Status: DC

## 2021-02-13 NOTE — Patient Instructions (Addendum)
For hands Start over the counter Working Hands Hand Cream is a moisturizer  Or over the  EXCIPIAL REPAIR Replenishing Hand Cream

## 2021-02-13 NOTE — Progress Notes (Signed)
   Follow-Up Visit   Subjective  Kathleen Everett is a 50 y.o. female who presents for the following: Follow-up (2 months f/u warts on hands treating with 5-Fluorouracil cream twice a day) and Seborrheic Keratosis (2 months f/u ISKs on the face and hands, changing color and growing ).   The following portions of the chart were reviewed this encounter and updated as appropriate:   Tobacco  Allergies  Meds  Problems  Med Hx  Surg Hx  Fam Hx      Review of Systems:  No other skin or systemic complaints except as noted in HPI or Assessment and Plan.  Objective  Well appearing patient in no apparent distress; mood and affect are within normal limits.  A focused examination was performed including face, hands . Relevant physical exam findings are noted in the Assessment and Plan.  Objective  bilateral hands: Verrucous papules -- Discussed viral etiology and contagion.   Objective  bilateral hands: Scaly pink plaques with fissures    Assessment & Plan  Other viral warts bilateral hands  Discussed viral etiology and risk of spread.  Discussed multiple treatments may be required to clear warts.  Discussed possible post-treatment dyspigmentation and risk of recurrence.   Start otc Salicylic acid apply to warts at night Cont 5-Fluorouracil cream twice a day to warts   Other Related Medications fluorouracil (EFUDEX) 5 % cream  Hand dermatitis bilateral hands  Chronic condition with duration or expected duration over one year. Condition is bothersome to patient. Currently flared.    Start Eucrisa ointment apply to hands twice a day Mon-Fri Start Clobetasol ointment  apply to hands twice a day on weekends only   for hands Start over the counter Working Hands Hand Cream is a moisturizer  Or EXCIPIAL REPAIR Replenishing Hand Cream to damp skin after washing hands  Ordered Medications: clobetasol ointment (TEMOVATE) 0.05 %  Reordered Medications Crisaborole  (EUCRISA) 2 % OINT   Seborrheic Keratoses R cheek  - Stuck-on, waxy, tan-brown papules and/or plaques  - Benign-appearing - Discussed benign etiology and prognosis. - Observe - Call for any changes   Return in about 6 weeks (around 03/27/2021).  I, Marye Round, CMA, am acting as scribe for Forest Gleason, MD .  Documentation: I have reviewed the above documentation for accuracy and completeness, and I agree with the above.  Forest Gleason, MD

## 2021-02-14 ENCOUNTER — Encounter: Payer: Self-pay | Admitting: Dermatology

## 2021-02-14 ENCOUNTER — Ambulatory Visit (INDEPENDENT_AMBULATORY_CARE_PROVIDER_SITE_OTHER): Payer: Medicare HMO

## 2021-02-14 VITALS — Ht 71.0 in | Wt 278.0 lb

## 2021-02-14 DIAGNOSIS — Z0000 Encounter for general adult medical examination without abnormal findings: Secondary | ICD-10-CM | POA: Diagnosis not present

## 2021-02-14 DIAGNOSIS — Z1159 Encounter for screening for other viral diseases: Secondary | ICD-10-CM | POA: Diagnosis not present

## 2021-02-14 NOTE — Patient Instructions (Addendum)
Kathleen Everett , Thank you for taking time to come for your Medicare Wellness Visit. I appreciate your ongoing commitment to your health goals. Please review the following plan we discussed and let me know if I can assist you in the future.   These are the goals we discussed: Goals    . Follow up with Primary Care Provider     As needed       This is a list of the screening recommended for you and due dates:  Health Maintenance  Topic Date Due  .  Hepatitis C: One time screening is recommended by Center for Disease Control  (CDC) for  adults born from [birth year] through 1963.   Never done  . Flu Shot  02/15/2021*  . Tetanus Vaccine  02/14/2022*  . Mammogram  03/08/2021  . Pap Smear  06/23/2022  . Colon Cancer Screening  04/27/2025  . HIV Screening  Completed  . HPV Vaccine  Aged Out  . COVID-19 Vaccine  Discontinued  *Topic was postponed. The date shown is not the original due date.    Immunizations  There is no immunization history on file for this patient.  Keep all routine maintenance appointments.   Follow up 03/01/21 @ 11:00  Advanced directives: not yet completed  Conditions/risks identified: none new  Follow up in one year for your annual wellness visit.   Preventive Care 40-64 Years, Female Preventive care refers to lifestyle choices and visits with your health care provider that can promote health and wellness. What does preventive care include?  A yearly physical exam. This is also called an annual well check.  Dental exams once or twice a year.  Routine eye exams. Ask your health care provider how often you should have your eyes checked.  Personal lifestyle choices, including:  Daily care of your teeth and gums.  Regular physical activity.  Eating a healthy diet.  Avoiding tobacco and drug use.  Limiting alcohol use.  Practicing safe sex.  Taking low-dose aspirin daily starting at age 70.  Taking vitamin and mineral supplements as recommended by  your health care provider. What happens during an annual well check? The services and screenings done by your health care provider during your annual well check will depend on your age, overall health, lifestyle risk factors, and family history of disease. Counseling  Your health care provider may ask you questions about your:  Alcohol use.  Tobacco use.  Drug use.  Emotional well-being.  Home and relationship well-being.  Sexual activity.  Eating habits.  Work and work Statistician.  Method of birth control.  Menstrual cycle.  Pregnancy history. Screening  You may have the following tests or measurements:  Height, weight, and BMI.  Blood pressure.  Lipid and cholesterol levels. These may be checked every 5 years, or more frequently if you are over 64 years old.  Skin check.  Lung cancer screening. You may have this screening every year starting at age 85 if you have a 30-pack-year history of smoking and currently smoke or have quit within the past 15 years.  Fecal occult blood test (FOBT) of the stool. You may have this test every year starting at age 49.  Flexible sigmoidoscopy or colonoscopy. You may have a sigmoidoscopy every 5 years or a colonoscopy every 10 years starting at age 61.  Hepatitis C blood test.  Hepatitis B blood test.  Sexually transmitted disease (STD) testing.  Diabetes screening. This is done by checking your blood sugar (glucose) after  you have not eaten for a while (fasting). You may have this done every 1-3 years.  Mammogram. This may be done every 1-2 years. Talk to your health care provider about when you should start having regular mammograms. This may depend on whether you have a family history of breast cancer.  BRCA-related cancer screening. This may be done if you have a family history of breast, ovarian, tubal, or peritoneal cancers.  Pelvic exam and Pap test. This may be done every 3 years starting at age 90. Starting at age  55, this may be done every 5 years if you have a Pap test in combination with an HPV test.  Bone density scan. This is done to screen for osteoporosis. You may have this scan if you are at high risk for osteoporosis. Discuss your test results, treatment options, and if necessary, the need for more tests with your health care provider. Vaccines  Your health care provider may recommend certain vaccines, such as:  Influenza vaccine. This is recommended every year.  Tetanus, diphtheria, and acellular pertussis (Tdap, Td) vaccine. You may need a Td booster every 10 years.  Zoster vaccine. You may need this after age 32.  Pneumococcal 13-valent conjugate (PCV13) vaccine. You may need this if you have certain conditions and were not previously vaccinated.  Pneumococcal polysaccharide (PPSV23) vaccine. You may need one or two doses if you smoke cigarettes or if you have certain conditions. Talk to your health care provider about which screenings and vaccines you need and how often you need them. This information is not intended to replace advice given to you by your health care provider. Make sure you discuss any questions you have with your health care provider. Document Released: 12/01/2015 Document Revised: 07/24/2016 Document Reviewed: 09/05/2015 Elsevier Interactive Patient Education  2017 [employer].    Fall Prevention in the Home Falls can cause injuries. They can happen to people of all ages. There are many things you can do to make your home safe and to help prevent falls. What can I do on the outside of my home?  Regularly fix the edges of walkways and driveways and fix any cracks.  Remove anything that might make you trip as you walk through a door, such as a raised step or threshold.  Trim any bushes or trees on the path to your home.  Use bright outdoor lighting.  Clear any walking paths of anything that might make someone trip, such as rocks or tools.  Regularly check to  see if handrails are loose or broken. Make sure that both sides of any steps have handrails.  Any raised decks and porches should have guardrails on the edges.  Have any leaves, snow, or ice cleared regularly.  Use sand or salt on walking paths during winter.  Clean up any spills in your garage right away. This includes oil or grease spills. What can I do in the bathroom?  Use night lights.  Install grab bars by the toilet and in the tub and shower. Do not use towel bars as grab bars.  Use non-skid mats or decals in the tub or shower.  If you need to sit down in the shower, use a plastic, non-slip stool.  Keep the floor dry. Clean up any water that spills on the floor as soon as it happens.  Remove soap buildup in the tub or shower regularly.  Attach bath mats securely with double-sided non-slip rug tape.  Do not have throw rugs and  other things on the floor that can make you trip. What can I do in the bedroom?  Use night lights.  Make sure that you have a light by your bed that is easy to reach.  Do not use any sheets or blankets that are too big for your bed. They should not hang down onto the floor.  Have a firm chair that has side arms. You can use this for support while you get dressed.  Do not have throw rugs and other things on the floor that can make you trip. What can I do in the kitchen?  Clean up any spills right away.  Avoid walking on wet floors.  Keep items that you use a lot in easy-to-reach places.  If you need to reach something above you, use a strong step stool that has a grab bar.  Keep electrical cords out of the way.  Do not use floor polish or wax that makes floors slippery. If you must use wax, use non-skid floor wax.  Do not have throw rugs and other things on the floor that can make you trip. What can I do with my stairs?  Do not leave any items on the stairs.  Make sure that there are handrails on both sides of the stairs and use  them. Fix handrails that are broken or loose. Make sure that handrails are as long as the stairways.  Check any carpeting to make sure that it is firmly attached to the stairs. Fix any carpet that is loose or worn.  Avoid having throw rugs at the top or bottom of the stairs. If you do have throw rugs, attach them to the floor with carpet tape.  Make sure that you have a light switch at the top of the stairs and the bottom of the stairs. If you do not have them, ask someone to add them for you. What else can I do to help prevent falls?  Wear shoes that:  Do not have high heels.  Have rubber bottoms.  Are comfortable and fit you well.  Are closed at the toe. Do not wear sandals.  If you use a stepladder:  Make sure that it is fully opened. Do not climb a closed stepladder.  Make sure that both sides of the stepladder are locked into place.  Ask someone to hold it for you, if possible.  Clearly mark and make sure that you can see:  Any grab bars or handrails.  First and last steps.  Where the edge of each step is.  Use tools that help you move around (mobility aids) if they are needed. These include:  Canes.  Walkers.  Scooters.  Crutches.  Turn on the lights when you go into a dark area. Replace any light bulbs as soon as they burn out.  Set up your furniture so you have a clear path. Avoid moving your furniture around.  If any of your floors are uneven, fix them.  If there are any pets around you, be aware of where they are.  Review your medicines with your doctor. Some medicines can make you feel dizzy. This can increase your chance of falling. Ask your doctor what other things that you can do to help prevent falls. This information is not intended to replace advice given to you by your health care provider. Make sure you discuss any questions you have with your health care provider. Document Released: 08/31/2009 Document Revised: 04/11/2016 Document Reviewed:  12/09/2014 Elsevier Interactive  Patient Education  2017 [employer].

## 2021-02-14 NOTE — Progress Notes (Signed)
Subjective:   Kathleen Everett is a 50 y.o. female who presents for Medicare Annual (Subsequent) preventive examination.  Review of Systems    No ROS.  Medicare Wellness Virtual Visit.   Cardiac Risk Factors include: advanced age (>56mn, >>35women)     Objective:    Today's Vitals   02/14/21 1306  Weight: 278 lb (126.1 kg)  Height: 5' 11"  (1.803 m)   Body mass index is 38.77 kg/m.  Advanced Directives 05/11/2020 02/14/2020 12/28/2019 12/02/2019 04/12/2017 08/17/2015 06/15/2015  Does Patient Have a Medical Advance Directive? No No No No No No No  Would patient like information on creating a medical advance directive? No - Patient declined No - Patient declined No - Patient declined No - Guardian declined No - Patient declined - Yes - Educational materials given  Some encounter information is confidential and restricted. Go to Review Flowsheets activity to see all data.    Current Medications (verified) Outpatient Encounter Medications as of 02/14/2021  Medication Sig  . acetaminophen (TYLENOL) 500 MG tablet Take 500-1,000 mg by mouth 2 (two) times daily as needed for mild pain.   .Marland KitchenAIMOVIG 140 MG/ML SOAJ   . Ascorbic Acid (VITAMIN C) 500 MG CHEW Chew 500 mg by mouth daily.  .Marland Kitchenatorvastatin (LIPITOR) 10 MG tablet Take 1 tablet by mouth once daily  . augmented betamethasone dipropionate (DIPROLENE-AF) 0.05 % cream Apply to hands twice a day as needed. Avoid applying to face, groin, and axilla. Use as directed.  . candesartan (ATACAND) 4 MG tablet Take 4 mg by mouth daily.  . clobetasol ointment (TEMOVATE) 0.05 % Apply to hands twice a day on weekends only. (Patient not taking: Reported on 02/08/2021)  . clobetasol ointment (TEMOVATE) 0.05 % Apply to hands on weekends only  . Crisaborole (EUCRISA) 2 % OINT Apply to hands twice a day Mon-Fri  . cyanocobalamin (,VITAMIN B-12,) 1000 MCG/ML injection INJECT ONE ML (CC) INTRAMUSCULARLY ONCE EVERY MONTH (Patient taking differently: Inject  1,000 mcg into the muscle every 30 (thirty) days.)  . diclofenac sodium (VOLTAREN) 1 % GEL Apply 1 application topically 4 (four) times daily as needed (pain.).   .Marland Kitchenesomeprazole (NEXIUM) 40 MG capsule Take 1 capsule by mouth once daily  . estradiol (ESTRACE VAGINAL) 0.1 MG/GM vaginal cream One applicator q hs x 5 nights and then 2x/week. (Patient taking differently: Place 1 Applicatorful vaginally 2 (two) times a week.)  . fluorouracil (EFUDEX) 5 % cream Apply topically in the morning and at bedtime. To affected areas at hands  . Fremanezumab-vfrm 225 MG/1.5ML SOAJ Inject 225 mg into the skin.  (Patient not taking: Reported on 02/08/2021)  . furosemide (LASIX) 20 MG tablet Take 1 tablet (20 mg total) by mouth daily as needed (As needed for leg swelling).  . gabapentin (NEURONTIN) 400 MG capsule Take 400 mg by mouth 3 (three) times daily.  .Marland Kitchenibuprofen (ADVIL) 800 MG tablet Take 800 mg by mouth 3 (three) times daily as needed (Pain).   .Marland Kitchenlactase (RA DAIRY AID) 3000 units tablet Take 9,000 Units by mouth 3 (three) times daily as needed (with dairy consumption).   . methocarbamol (ROBAXIN) 500 MG tablet Take 1 tablet (500 mg total) by mouth every 6 (six) hours as needed for muscle spasms. (Patient taking differently: Take 500 mg by mouth 3 (three) times daily.)  . mupirocin ointment (BACTROBAN) 2 % Apply 1 application topically daily.  .Marland KitchenNARCAN 4 MG/0.1ML LIQD nasal spray kit Place 0.4 mg into the nose  once.  . NON FORMULARY Take 2 capsules by mouth daily. Thrive vitamins with shake and derma patch  . nystatin cream (MYCOSTATIN) APPLY TOPICALLY TWICE DAILY  . Oxycodone HCl 10 MG TABS Take 10 mg by mouth 5 (five) times daily.   . potassium chloride (KLOR-CON) 10 MEQ tablet Take 1 tablet (10 mEq total) by mouth daily as needed (As needed with Furosemide (Lasix)).  . rizatriptan (MAXALT) 10 MG tablet Take 10 mg by mouth as needed for migraine. May repeat in 2 hours if needed  . sertraline (ZOLOFT) 100 MG  tablet Take 1.5 tablets (150 mg total) by mouth daily for 8 days, THEN 2 tablets (200 mg total) daily.  . Simethicone (GAS RELIEF PO) Take 125 mg by mouth 2 (two) times daily as needed for flatulence. Anti gas  . triamcinolone cream (KENALOG) 0.1 % APPLY  CREAM EXTERNALLY TWICE DAILY TO AFFECTED AREA ON  FINGERS   No facility-administered encounter medications on file as of 02/14/2021.    Allergies (verified) Vancomycin, Ceftin [cefuroxime axetil], Celexa [citalopram hydrobromide], Clindamycin/lincomycin, Penicillins, and Sulfa antibiotics   History: Past Medical History:  Diagnosis Date  . Anxiety   . Back pain   . Chiari malformation    s/p sgy decompression  . Chronic tension headaches   . Complication of anesthesia   . DDD (degenerative disc disease), lumbar   . Depression   . Diverticulitis   . Dysmenorrhea   . Dysplastic nevus 10/09/2015   R abdomen - moderate, limited margins free.  . Endometriosis   . Gas pain   . GERD (gastroesophageal reflux disease)   . Hidradenitis   . Hyperlipidemia   . Mitral valve prolapse   . Obesity   . Osteoarthritis   . Ovarian cyst    hx  . PONV (postoperative nausea and vomiting)   . Sleep apnea    central sleep apnea  No CPAP right now  . TMJ (dislocation of temporomandibular joint)   . Vitamin B12 deficiency    On injections   Past Surgical History:  Procedure Laterality Date  . ABDOMINAL HYSTERECTOMY  03/27/07   laparoscopic  . BREAST EXCISIONAL BIOPSY Left 2004   fibroadenoma  . CESAREAN SECTION     1999/2004 twins  . chiari decompression    . COLONOSCOPY WITH PROPOFOL N/A 04/28/2015   Procedure: COLONOSCOPY WITH PROPOFOL;  Surgeon: Manya Silvas, MD;  Location: [facility] ENDOSCOPY;  Service: Endoscopy;  Laterality: N/A;  . CYSTOSCOPY     bladder ulcers  . ESOPHAGOGASTRODUODENOSCOPY N/A 04/28/2015   Procedure: ESOPHAGOGASTRODUODENOSCOPY (EGD);  Surgeon: Manya Silvas, MD;  Location: [facility] ENDOSCOPY;  Service: Endoscopy;   Laterality: N/A;  . HERNIA REPAIR Right 2003   inguinal  . INCISION AND DRAINAGE HIP Right 05/11/2020   Procedure: IRRIGATION AND DEBRIDEMENT HIP;  Surgeon: Hessie Knows, MD;  Location: [facility] ORS;  Service: Orthopedics;  Laterality: Right;  . OVARIAN CYST REMOVAL  2001   right  . TEMPOROMANDIBULAR JOINT SURGERY  2000  . TOTAL HIP ARTHROPLASTY Right 12/28/2019   Procedure: TOTAL HIP ARTHROPLASTY ANTERIOR APPROACH;  Surgeon: Hessie Knows, MD;  Location: [facility] ORS;  Service: Orthopedics;  Laterality: Right;  . TUBAL LIGATION     Bilateral   Family History  Problem Relation Age of Onset  . Hyperlipidemia Mother   . Anxiety disorder Mother   . Atrial fibrillation Mother   . Hypertension Mother   . Anxiety disorder Father   . Stroke Father   . Hyperlipidemia Father   .  Hypertension Father   . Congestive Heart Failure Father   . Depression Father   . Hyperlipidemia Brother   . Colon cancer Paternal Aunt   . Dementia Paternal Aunt   . Dementia Maternal Grandmother   . Dementia Paternal Grandmother   . ADD / ADHD Daughter   . Breast cancer Neg Hx    Social History   Socioeconomic History  . Marital status: Married    Spouse name: Not on file  . Number of children: 3  . Years of education: 23  . Highest education level: Not on file  Occupational History  . Not on file  Tobacco Use  . Smoking status: Never Smoker  . Smokeless tobacco: Never Used  Vaping Use  . Vaping Use: Never used  Substance and Sexual Activity  . Alcohol use: Yes    Alcohol/week: 0.0 standard drinks    Comment: rarely  . Drug use: No  . Sexual activity: Yes    Birth control/protection: None  Other Topics Concern  . Not on file  Social History Narrative   Patient drinks 2-3 cups of caffeine daily.   Patient is right handed.       Social Hx:   Current living situation- [place]. Living with husband and 3 kids   Born and Raised- [place].raised by both parents.    Siblings- 1 brother who is 7 yrs older  than her   Legal issues- none   Married for 24 yrs. Currently unemployed since 2011 when she hurt her back. HS grad and CNA and phlebotomy.    Social Determinants of Health   Financial Resource Strain: Not on file  Food Insecurity: Not on file  Transportation Needs: Not on file  Physical Activity: Not on file  Stress: Not on file  Social Connections: Not on file    Tobacco Counseling Counseling given: Not Answered   Clinical Intake:  Pre-visit preparation completed: Yes        Diabetes: No  How often do you need to have someone help you when you read instructions, pamphlets, or other written materials from your doctor or pharmacy?: 1 - Never    Interpreter Needed?: No      Activities of Daily Living In your present state of health, do you have any difficulty performing the following activities: 02/14/2021 05/11/2020  Hearing? N N  Vision? N N  Difficulty concentrating or making decisions? Y N  Comment Followed by Neurology -  Walking or climbing stairs? Y N  Comment Ambulates with cane as needed -  Dressing or bathing? N N  Doing errands, shopping? Y N  Comment Does not run errands alone Facilities manager and eating ? N -  Using the Toilet? N -  In the past six months, have you accidently leaked urine? Y -  Comment Followed by Urology. Managed with daily pad as needed -  Do you have problems with loss of bowel control? N -  Managing your Medications? N -  Managing your Finances? N -  Housekeeping or managing your Housekeeping? Y -  Comment Children assist -  Some recent data might be hidden    Patient Care Team: [name], MD as PCP - General (Internal Medicine) [name], MD as Referring Physician (Neurology) [name], MD as Consulting Physician (Cardiology)  Indicate any recent Medical Services you may have received from other than [facility] providers in the past year (date may be approximate).     Assessment:   This  is a routine  wellness examination for Kathleen Everett.  I connected with Kathleen Everett today by telephone and verified that I am speaking with the correct person using two identifiers. Location patient: home Location provider: work Persons participating in the virtual visit: patient, Marine scientist.    I discussed the limitations, risks, security and privacy concerns of performing an evaluation and management service by telephone and the availability of in person appointments. The patient expressed understanding and verbally consented to this telephonic visit.    Interactive audio and video telecommunications were attempted between this provider and patient, however failed, due to patient having technical difficulties OR patient did not have access to video capability.  We continued and completed visit with audio only.  Some vital signs may be absent or patient reported.   Hearing/Vision screen  Hearing Screening   125Hz  250Hz  500Hz  1000Hz  2000Hz  3000Hz  4000Hz  6000Hz  8000Hz   Right ear:           Left ear:           Comments: Patient is able to hear conversational tones without difficulty.  No issues reported.  Vision Screening Comments: Followed by Kathleen Everett [facility] Wears corrective lenses Visual acuity not assessed, virtual visit.  They have seen their ophthalmologist in the last 12 months.     Dietary issues and exercise activities discussed: Current Exercise Habits: The patient does not participate in regular exercise at present  Regular diet  Goals    . Follow up with Primary Care Provider     As needed      Depression Screen PHQ 2/9 Scores 06/26/2020 02/14/2020 06/19/2018 09/30/2016 07/12/2016 09/20/2015 06/20/2015  PHQ - 2 Score 6 4 6  0 6 6 6   PHQ- 9 Score 23 8 20  - 19 19 24   Some encounter information is confidential and restricted. Go to Review Flowsheets activity to see all data.    Fall Risk Fall Risk  02/14/2021 06/26/2020 02/14/2020 06/19/2018 09/30/2016  Falls in the past year? 0 1 1 Yes No  Number falls in past yr:  0 1 - 2 or more -  Injury with Fall? 0 0 0 No -  Risk Factor Category  - - - High Fall Risk -  Risk for fall due to : Impaired balance/gait History of fall(s) - Impaired balance/gait -  Follow up Falls evaluation completed Falls evaluation completed Falls prevention discussed;Falls evaluation completed - -    FALL RISK PREVENTION PERTAINING TO THE HOME: Handrails in use when climbing stairs? Yes Home free of loose throw rugs in walkways, pet beds, electrical cords, etc? Yes  Adequate lighting in your home to reduce risk of falls? Yes   ASSISTIVE DEVICES UTILIZED TO PREVENT FALLS: Use of a cane, walker or w/c? No   TIMED UP AND GO: Was the test performed? No .   Cognitive Function: MMSE - Mini Mental State Exam 02/14/2021  Not completed: Unable to complete  Patient is alert Notes short term memory loss Followed by Neurology   6CIT Screen 02/14/2020  What Year? 0 points  What month? 0 points  What time? 0 points  Count back from 20 0 points  Months in reverse 0 points  Repeat phrase 6 points  Total Score 6    Immunizations  There is no immunization history on file for this patient.  TDAP status: Due, Education has been provided regarding the importance of this vaccine. Advised may receive this vaccine at local pharmacy or Health Dept. Aware to provide a copy of the vaccination  record if obtained from local pharmacy or Health Dept. Verbalized acceptance and understanding. Deferred.    Health Maintenance Health Maintenance  Topic Date Due  . Hepatitis C Screening  Never done  . INFLUENZA VACCINE  02/15/2021 (Originally 06/18/2020)  . TETANUS/TDAP  02/14/2022 (Originally 06/26/1992)  . MAMMOGRAM  03/08/2021  . PAP SMEAR-Modifier  06/23/2022  . COLONOSCOPY (Pts 45-67yr Insurance coverage will need to be confirmed)  04/27/2025  . HIV Screening  Completed  . HPV VACCINES  Aged Out  . COVID-19 Vaccine  Discontinued   Colorectal cancer screening: Type of screening: Colonoscopy.  Completed 04/28/15. Repeat every 5 years. Scheduled 03/2021.   Mammogram status: Completed 03/08/20. Repeat every year  Lung Cancer Screening: (Low Dose CT Chest recommended if Age 48-80years, 30 pack-year currently smoking OR have quit w/in 15years.) does not qualify.   Hepatitis C Screening: does qualify. Consent given.   Vision Screening: Recommended annual ophthalmology exams for early detection of glaucoma and other disorders of the eye. Is the patient up to date with their annual eye exam?  Yes  Who is the provider or what is the name of the office in which the patient attends annual eye exams? [facility]: Recommended annual dental exams for proper oral hygiene.  Community Resource Referral / Chronic Care Management: CRR required this visit?  No   CCM required this visit?  No      Plan:   Keep all routine maintenance appointments.   Follow up 03/01/21 @ 11:00  I have personally reviewed and noted the following in the patient's chart:   . Medical and social history . Use of alcohol, tobacco or illicit drugs  . Current medications and supplements . Functional ability and status . Nutritional status . Physical activity . Advanced directives . List of other physicians . Hospitalizations, surgeries, and ER visits in previous 12 months . Vitals . Screenings to include cognitive, depression, and falls . Referrals and appointments  In addition, I have reviewed and discussed with patient certain preventive protocols, quality metrics, and best practice recommendations. A written personalized care plan for preventive services as well as general preventive health recommendations were provided to patient via mychart.     OVarney Biles LPN   [id number]

## 2021-03-01 ENCOUNTER — Encounter: Payer: Self-pay | Admitting: Internal Medicine

## 2021-03-01 ENCOUNTER — Ambulatory Visit (INDEPENDENT_AMBULATORY_CARE_PROVIDER_SITE_OTHER): Payer: Medicare HMO | Admitting: Internal Medicine

## 2021-03-01 ENCOUNTER — Other Ambulatory Visit: Payer: Self-pay

## 2021-03-01 ENCOUNTER — Other Ambulatory Visit (HOSPITAL_COMMUNITY)
Admission: RE | Admit: 2021-03-01 | Discharge: 2021-03-01 | Disposition: A | Discharge status: Home / Self Care | Payer: Medicare HMO | Source: Ambulatory Visit | Attending: Internal Medicine | Admitting: Internal Medicine

## 2021-03-01 VITALS — BP 128/80 | HR 88 | Temp 96.8°F | Resp 16 | Ht 71.0 in | Wt 298.0 lb

## 2021-03-01 DIAGNOSIS — E7800 Pure hypercholesterolemia, unspecified: Secondary | ICD-10-CM | POA: Diagnosis not present

## 2021-03-01 DIAGNOSIS — G8929 Other chronic pain: Secondary | ICD-10-CM

## 2021-03-01 DIAGNOSIS — M7989 Other specified soft tissue disorders: Secondary | ICD-10-CM

## 2021-03-01 DIAGNOSIS — N760 Acute vaginitis: Secondary | ICD-10-CM | POA: Diagnosis not present

## 2021-03-01 DIAGNOSIS — D649 Anemia, unspecified: Secondary | ICD-10-CM

## 2021-03-01 DIAGNOSIS — F32A Depression, unspecified: Secondary | ICD-10-CM

## 2021-03-01 DIAGNOSIS — Q0700 Arnold-Chiari syndrome without spina bifida or hydrocephalus: Secondary | ICD-10-CM

## 2021-03-01 DIAGNOSIS — F320 Major depressive disorder, single episode, mild: Secondary | ICD-10-CM | POA: Diagnosis not present

## 2021-03-01 DIAGNOSIS — K649 Unspecified hemorrhoids: Secondary | ICD-10-CM

## 2021-03-01 DIAGNOSIS — G4733 Obstructive sleep apnea (adult) (pediatric): Secondary | ICD-10-CM | POA: Diagnosis not present

## 2021-03-01 DIAGNOSIS — M545 Low back pain, unspecified: Secondary | ICD-10-CM

## 2021-03-01 DIAGNOSIS — R739 Hyperglycemia, unspecified: Secondary | ICD-10-CM | POA: Diagnosis not present

## 2021-03-01 DIAGNOSIS — Z1159 Encounter for screening for other viral diseases: Secondary | ICD-10-CM

## 2021-03-01 DIAGNOSIS — Z6841 Body Mass Index (BMI) 40.0 and over, adult: Secondary | ICD-10-CM | POA: Diagnosis not present

## 2021-03-01 DIAGNOSIS — G43809 Other migraine, not intractable, without status migrainosus: Secondary | ICD-10-CM

## 2021-03-01 DIAGNOSIS — F411 Generalized anxiety disorder: Secondary | ICD-10-CM

## 2021-03-01 DIAGNOSIS — F439 Reaction to severe stress, unspecified: Secondary | ICD-10-CM

## 2021-03-01 DIAGNOSIS — N898 Other specified noninflammatory disorders of vagina: Secondary | ICD-10-CM

## 2021-03-01 DIAGNOSIS — K219 Gastro-esophageal reflux disease without esophagitis: Secondary | ICD-10-CM

## 2021-03-01 LAB — HEPATIC FUNCTION PANEL
ALT: 24 U/L (ref 0–35)
AST: 23 U/L (ref 0–37)
Albumin: 4.2 g/dL (ref 3.5–5.2)
Alkaline Phosphatase: 113 U/L (ref 39–117)
Bilirubin, Direct: 0.1 mg/dL (ref 0.0–0.3)
Total Bilirubin: 0.4 mg/dL (ref 0.2–1.2)
Total Protein: 6.8 g/dL (ref 6.0–8.3)

## 2021-03-01 LAB — BASIC METABOLIC PANEL
BUN: 9 mg/dL (ref 6–23)
CO2: 30 mEq/L (ref 19–32)
Calcium: 9.1 mg/dL (ref 8.4–10.5)
Chloride: 103 mEq/L (ref 96–112)
Creatinine, Ser: 0.74 mg/dL (ref 0.40–1.20)
GFR: 96.17 mL/min (ref >60.00)
Glucose, Bld: 75 mg/dL (ref 70–99)
Potassium: 4.1 mEq/L (ref 3.5–5.1)
Sodium: 141 mEq/L (ref 135–145)

## 2021-03-01 LAB — LIPID PANEL
Cholesterol: 230 mg/dL — ABNORMAL HIGH (ref 0–200)
HDL: 41.9 mg/dL (ref >39.00)
LDL Cholesterol: 154 mg/dL — ABNORMAL HIGH (ref 0–99)
NonHDL: 187.6
Total CHOL/HDL Ratio: 5
Triglycerides: 170 mg/dL — ABNORMAL HIGH (ref 0.0–149.0)
VLDL: 34 mg/dL (ref 0.0–40.0)

## 2021-03-01 LAB — HEMOGLOBIN A1C: Hgb A1c MFr Bld: 5.7 % (ref 4.6–6.5)

## 2021-03-01 NOTE — Progress Notes (Signed)
Patient ID: Kathleen Everett, female   DOB: 20-Sep-1971, 50 y.o.   MRN: [id number]   Subjective:    Patient ID: Kathleen Everett, female    DOB: Nov 17, 1971, 50 y.o.   MRN: [id number]  HPI This visit occurred during the SARS-CoV-2 public health emergency.  Safety protocols were in place, including screening questions prior to the visit, additional usage of staff PPE, and extensive cleaning of exam room while observing appropriate contact time as indicated for disinfecting solutions.  Patient here for a scheduled follow up.  She has a history of chronic back pain.  Followed by pain clinic.  Continues on oxycodone, gabapentin, ibuprofen, topamax.  Discussed exercise as tolerated.  No chest pain reported.  Breathing stable.  No increased acid reflux reported.  Taking dexilant and pepcid.  Saw Dr Bary Castilla for hemorrhoids.  Instructed miralax.  Followed by psychiatry for treatment - depression.  Stable.  On zoloft - now on 239m q day.  Concern regarding weight gain.  States she tries to watch her diet.  Limited exercise as outlined above.  Interested in prescription medication to help with weight loss.  Followed by neurology for migraines.  Was placed on candesartan for prevention of headaches.  Per report - not taking. Prescribed ubrelvy.  Continue ajovy monthly injections.  Overall headaches appear to be stable.  She is concerned regarding right thigh fullness.  Increased soft tissue fullness.  Request referral to ortho.  Some vaginal irritation as well - started approximately three weeks ago.  Concern about an area - feels/looks like a cut per pt.    Past Medical History:  Diagnosis Date  . Anxiety   . Back pain   . Chiari malformation    s/p sgy decompression  . Chronic tension headaches   . Complication of anesthesia   . DDD (degenerative disc disease), lumbar   . Depression   . Diverticulitis   . Dysmenorrhea   . Dysplastic nevus 10/09/2015   R abdomen - moderate, limited margins free.   . Endometriosis   . Gas pain   . GERD (gastroesophageal reflux disease)   . Hidradenitis   . Hyperlipidemia   . Mitral valve prolapse   . Obesity   . Osteoarthritis   . Ovarian cyst    hx  . PONV (postoperative nausea and vomiting)   . Sleep apnea    central sleep apnea  No CPAP right now  . TMJ (dislocation of temporomandibular joint)   . Vitamin B12 deficiency    On injections   Past Surgical History:  Procedure Laterality Date  . ABDOMINAL HYSTERECTOMY  03/27/07   laparoscopic  . BREAST EXCISIONAL BIOPSY Left 2004   fibroadenoma  . CESAREAN SECTION     1999/2004 twins  . chiari decompression    . COLONOSCOPY WITH PROPOFOL N/A 04/28/2015   Procedure: COLONOSCOPY WITH PROPOFOL;  Surgeon: RManya Silvas MD;  [facility]  Service: Endoscopy;  Laterality: N/A;  . CYSTOSCOPY     bladder ulcers  . ESOPHAGOGASTRODUODENOSCOPY N/A 04/28/2015   Procedure: ESOPHAGOGASTRODUODENOSCOPY (EGD);  Surgeon: RManya Silvas MD;  [facility];  Service: Endoscopy;  Laterality: N/A;  . HERNIA REPAIR Right 2003   inguinal  . INCISION AND DRAINAGE HIP Right 05/11/2020   Procedure: IRRIGATION AND DEBRIDEMENT HIP;  Surgeon: MHessie Knows MD;  Location: [facility] ORS;  Service: Orthopedics;  Laterality: Right;  . OVARIAN CYST REMOVAL  2001   right  . TEMPOROMANDIBULAR JOINT SURGERY  2000  . TOTAL  HIP ARTHROPLASTY Right 12/28/2019   Procedure: TOTAL HIP ARTHROPLASTY ANTERIOR APPROACH;  Surgeon: Hessie Knows, MD;  Location: [facility] ORS;  Service: Orthopedics;  Laterality: Right;  . TUBAL LIGATION     Bilateral   Family History  Problem Relation Age of Onset  . Hyperlipidemia Mother   . Anxiety disorder Mother   . Atrial fibrillation Mother   . Hypertension Mother   . Anxiety disorder Father   . Stroke Father   . Hyperlipidemia Father   . Hypertension Father   . Congestive Heart Failure Father   . Depression Father   . Hyperlipidemia Brother   . Colon cancer  Paternal Aunt   . Dementia Paternal Aunt   . Dementia Maternal Grandmother   . Dementia Paternal Grandmother   . ADD / ADHD Daughter   . Breast cancer Neg Hx    Social History   Socioeconomic History  . Marital status: Married    Spouse name: Not on file  . Number of children: 3  . Years of education: 3  . Highest education level: Not on file  Occupational History  . Not on file  Tobacco Use  . Smoking status: Never Smoker  . Smokeless tobacco: Never Used  Vaping Use  . Vaping Use: Never used  Substance and Sexual Activity  . Alcohol use: Yes    Alcohol/week: 0.0 standard drinks    Comment: rarely  . Drug use: No  . Sexual activity: Yes    Birth control/protection: None  Other Topics Concern  . Not on file  Social History Narrative   Patient drinks 2-3 cups of caffeine daily.   Patient is right handed.       Social Hx:   Current living situation- [place]. Living with husband and 3 kids   Born and Raised- [place].raised by both parents.    Siblings- 1 brother who is 7 yrs older than her   Legal issues- none   Married for 24 yrs. Currently unemployed since 2011 when she hurt her back. HS grad and CNA and phlebotomy.    Social Determinants of Health   Financial Resource Strain: Not on file  Food Insecurity: Not on file  Transportation Needs: Not on file  Physical Activity: Not on file  Stress: Not on file  Social Connections: Not on file    Outpatient Encounter Medications as of 03/01/2021  Medication Sig  . acetaminophen (TYLENOL) 500 MG tablet Take 500-1,000 mg by mouth 2 (two) times daily as needed for mild pain.   Marland Kitchen AIMOVIG 140 MG/ML SOAJ   . Ascorbic Acid (VITAMIN C) 500 MG CHEW Chew 500 mg by mouth daily.  Marland Kitchen atorvastatin (LIPITOR) 10 MG tablet Take 1 tablet by mouth once daily  . augmented betamethasone dipropionate (DIPROLENE-AF) 0.05 % cream Apply to hands twice a day as needed. Avoid applying to face, groin, and axilla. Use as directed.  Stasia Cavalier  (EUCRISA) 2 % OINT Apply to hands twice a day Mon-Fri  . cyanocobalamin (,VITAMIN B-12,) 1000 MCG/ML injection INJECT ONE ML (CC) INTRAMUSCULARLY ONCE EVERY MONTH (Patient taking differently: Inject 1,000 mcg into the muscle every 30 (thirty) days.)  . diclofenac sodium (VOLTAREN) 1 % GEL Apply 1 application topically 4 (four) times daily as needed (pain.).   Marland Kitchen esomeprazole (NEXIUM) 40 MG capsule Take 1 capsule by mouth once daily  . estradiol (ESTRACE VAGINAL) 0.1 MG/GM vaginal cream One applicator q hs x 5 nights and then 2x/week. (Patient taking differently: Place 1 Applicatorful vaginally 2 (two)  times a week.)  . fluorouracil (EFUDEX) 5 % cream Apply topically in the morning and at bedtime. To affected areas at hands  . furosemide (LASIX) 20 MG tablet Take 1 tablet (20 mg total) by mouth daily as needed (As needed for leg swelling).  . gabapentin (NEURONTIN) 400 MG capsule Take 400 mg by mouth 3 (three) times daily.  Marland Kitchen ibuprofen (ADVIL) 800 MG tablet Take 800 mg by mouth 3 (three) times daily as needed (Pain).   Marland Kitchen lactase (RA DAIRY AID) 3000 units tablet Take 9,000 Units by mouth 3 (three) times daily as needed (with dairy consumption).   . mupirocin ointment (BACTROBAN) 2 % Apply 1 application topically daily.  Marland Kitchen NARCAN 4 MG/0.1ML LIQD nasal spray kit Place 0.4 mg into the nose once.  . NON FORMULARY Take 2 capsules by mouth daily. Thrive vitamins with shake and derma patch  . nystatin cream (MYCOSTATIN) APPLY TOPICALLY TWICE DAILY  . Oxycodone HCl 10 MG TABS Take 10 mg by mouth 5 (five) times daily.   . potassium chloride (KLOR-CON) 10 MEQ tablet Take 1 tablet (10 mEq total) by mouth daily as needed (As needed with Furosemide (Lasix)).  . rizatriptan (MAXALT) 10 MG tablet Take 10 mg by mouth as needed for migraine. May repeat in 2 hours if needed  . sertraline (ZOLOFT) 100 MG tablet Take 1.5 tablets (150 mg total) by mouth daily for 8 days, THEN 2 tablets (200 mg total) daily.  . Simethicone  (GAS RELIEF PO) Take 125 mg by mouth 2 (two) times daily as needed for flatulence. Anti gas  . triamcinolone cream (KENALOG) 0.1 % APPLY  CREAM EXTERNALLY TWICE DAILY TO AFFECTED AREA ON  FINGERS  . [DISCONTINUED] candesartan (ATACAND) 4 MG tablet Take 4 mg by mouth daily. (Patient not taking: Reported on 03/01/2021)  . [DISCONTINUED] clobetasol ointment (TEMOVATE) 0.05 % Apply to hands twice a day on weekends only. (Patient not taking: No sig reported)  . [DISCONTINUED] clobetasol ointment (TEMOVATE) 0.05 % Apply to hands on weekends only (Patient not taking: Reported on 03/01/2021)  . [DISCONTINUED] Fremanezumab-vfrm 225 MG/1.5ML SOAJ Inject 225 mg into the skin.  (Patient not taking: Reported on 02/08/2021)  . [DISCONTINUED] methocarbamol (ROBAXIN) 500 MG tablet Take 1 tablet (500 mg total) by mouth every 6 (six) hours as needed for muscle spasms. (Patient taking differently: Take 500 mg by mouth 3 (three) times daily.)   No facility-administered encounter medications on file as of 03/01/2021.    Review of Systems  Constitutional: Negative for appetite change and unexpected weight change.  HENT: Negative for congestion and sinus pressure.   Respiratory: Negative for cough, chest tightness and shortness of breath.   Cardiovascular: Negative for chest pain, palpitations and leg swelling.  Gastrointestinal: Negative for abdominal pain, diarrhea, nausea and vomiting.  Genitourinary: Negative for difficulty urinating and dysuria.  Musculoskeletal: Positive for back pain. Negative for joint swelling and myalgias.  Skin: Negative for color change and rash.  Neurological: Negative for dizziness and light-headedness.       Seeing neurology for her headaches.   Psychiatric/Behavioral: Negative for agitation and dysphoric mood.       Objective:    Physical Exam Vitals reviewed.  Constitutional:      General: She is not in acute distress.    Appearance: Normal appearance.  HENT:     Head:  Normocephalic and atraumatic.     Right Ear: External ear normal.     Left Ear: External ear normal.  Eyes:  General: No scleral icterus.       Right eye: No discharge.        Left eye: No discharge.     Conjunctiva/sclera: Conjunctivae normal.  Neck:     Thyroid: No thyromegaly.  Cardiovascular:     Rate and Rhythm: Normal rate and regular rhythm.  Pulmonary:     Effort: No respiratory distress.     Breath sounds: Normal breath sounds. No wheezing.  Abdominal:     General: Bowel sounds are normal.     Palpations: Abdomen is soft.     Tenderness: There is no abdominal tenderness.  Genitourinary:    Comments: Normal external genitalia.  Could not visualize a cut.  Vaginal vault without lesions.  Could not appreciate any adnexal masses or tenderness.   Musculoskeletal:        General: No swelling or tenderness.     Cervical back: Neck supple. No tenderness.     Comments: Increased soft tissue fullness right thigh.  Non tender.  No redness.   Lymphadenopathy:     Cervical: No cervical adenopathy.  Skin:    Findings: No erythema or rash.  Neurological:     Mental Status: She is alert.  Psychiatric:        Mood and Affect: Mood normal.        Behavior: Behavior normal.     BP 128/80   Pulse 88   Temp (!) 96.8 F (36 C) (Oral)   Resp 16   Ht 5' 11"  (1.803 m)   Wt 298 lb (135.2 kg)   LMP 12/08/2006   SpO2 98%   BMI 41.56 kg/m  Wt Readings from Last 3 Encounters:  03/01/21 298 lb (135.2 kg)  02/14/21 278 lb (126.1 kg)  08/17/20 278 lb (126.1 kg)     Lab Results  Component Value Date   WBC 5.7 06/26/2020   HGB 12.7 06/26/2020   HCT 38.5 06/26/2020   PLT 248.0 06/26/2020   GLUCOSE 75 03/01/2021   CHOL 230 (H) 03/01/2021   TRIG 170.0 (H) 03/01/2021   HDL 41.90 03/01/2021   LDLDIRECT 181.0 06/26/2020   LDLCALC 154 (H) 03/01/2021   ALT 24 03/01/2021   AST 23 03/01/2021   NA 141 03/01/2021   K 4.1 03/01/2021   CL 103 03/01/2021   CREATININE 0.74 03/01/2021    BUN 9 03/01/2021   CO2 30 03/01/2021   TSH 3.44 06/26/2020   INR 1.1 12/03/2019   HGBA1C 5.7 03/01/2021    DG Bone Density  Result Date: 09/20/2020 EXAM: DUAL X-RAY ABSORPTIOMETRY (DXA) FOR BONE MINERAL DENSITY IMPRESSION: Your patient Lari Linson completed a BMD test on 09/20/2020 using the [place] (software version: 14.10) manufactured by [employer]. The following summarizes the results of our evaluation. Technologist: [facility] PATIENT BIOGRAPHICAL: Name: Cimone, Fahey Patient ID:  [id number] Birth Date: 1971/10/27 Height:     70.0 in. Gender:      Female Exam Date:  09/20/2020 Weight:     284.0 lbs. Indications: Caucasian, Hysterectomy, Oophorectomy Bilateral, Postmenopausal, right hip replacement Fractures: Treatments: Vitamin D DENSITOMETRY RESULTS: Site         Region     Measured Date Measured Age WHO Classification Young Adult T-score BMD         %Change vs. Previous Significant Change (*) Left Femur Neck 09/20/2020 47.2 Normal 0.5 1.104 g/cm2 Left Forearm Radius 33% 09/20/2020 47.2 Normal 0.6 0.926 g/cm2 ASSESSMENT: The BMD measured at Femur Neck is 1.104  g/cm2 with a T-score of 0.5. This patient is considered NORMAL according to [facility]) criteria. The scan quality is good. Lumbar spine was not utilized due to advanced degenerative changes. Right femur was excluded due to surgical hardware. World Pharmacologist [facility]) criteria for post-menopausal, Caucasian Women: Normal:                   T-score at or above -1 SD Osteopenia/low bone mass: T-score between -1 and -2.5 SD Osteoporosis:             T-score at or below -2.5 SD RECOMMENDATIONS: 1. All patients should optimize calcium and vitamin D intake. 2. Consider FDA-approved medical therapies in postmenopausal women and men aged 20 years and older, based on the following: a. A hip or vertebral(clinical or morphometric) fracture b. T-score < -2.5 at the femoral neck or spine after  appropriate evaluation to exclude secondary causes c. Low bone mass (T-score between -1.0 and -2.5 at the femoral neck or spine) and a 10-year probability of a hip fracture > 3% or a 10-year probability of a major osteoporosis-related fracture > 20% based on the [place]-adapted WHO algorithm 3. Clinician judgment and/or patient preferences may indicate treatment for people with 10-year fracture probabilities above or below these levels FOLLOW-UP: People with diagnosed cases of osteoporosis or at high risk for fracture should have regular bone mineral density tests. For patients eligible for Medicare, routine testing is allowed once every 2 years. The testing frequency can be increased to one year for patients who have rapidly progressing disease, those who are receiving or discontinuing medical therapy to restore bone mass, or have additional risk factors. I have reviewed this report, and agree with the above findings. Mark A. Thornton Papas, M.D. [facility] Electronically Signed   By: Lavonia Dana M.D.   On: 09/20/2020 15:15       Assessment & Plan:   Problem List Items Addressed This Visit    Anemia    Follow cbc.       [place]-Chiari malformation (HCC)    S/p surgery.  Being followed by neurology.       BMI 40.0-44.9, adult (HCC)    Discussed diet and exercise as tolerated.  She is limited with her exercise.  She is interested in prescription medication to help with weight loss.  CCM referral.       Relevant Orders   AMB Referral to [facility] Coordinaton   Chronic back pain    Followed by pain clinic.      Generalized anxiety disorder    Seeing psychiatry.  Appears to be stable.       GERD (gastroesophageal reflux disease)    Continue dexilant and pepcid.        Headache, migraine    Followed by neurology.  Medications as outlined.  Stable.       Hemorrhoids    Saw Dr Bary Castilla.  Continue miralax.  Follow.       Hypercholesterolemia    Continue lipitor.  Low  cholesterol diet and exercise.  Follow lipid panel and liver function tests.        Hyperglycemia    Low carb diet and exercise.  Follow met b and a1c.       Mild depression ([place])    On zoloft.  277m now.  Continue f/u with psychiatry.       Sleep apnea    CPAP.       Soft tissue mass  Unclear etiology.  Question if assymetric secondary to underlying back and hip issues.  Will have ortho evaluate.       Relevant Orders   Ambulatory referral to Orthopedic Surgery   Stress    On zoloft.  Followed by psychiatry.  Appears to be stable.       Vaginal irritation    Could not visualize a cut.  KOH/wet prep obtained.  Nystatin cream.        Other Visit Diagnoses    Acute vaginitis    -  Primary   Relevant Orders   Cervicovaginal ancillary only( [place])   Need for hepatitis C screening test           [name], MD

## 2021-03-02 LAB — HEPATITIS C ANTIBODY
Hepatitis C Ab: NONREACTIVE
SIGNAL TO CUT-OFF: 0.12 (ref <1.00)

## 2021-03-03 ENCOUNTER — Encounter: Payer: Self-pay | Admitting: Internal Medicine

## 2021-03-03 DIAGNOSIS — M7989 Other specified soft tissue disorders: Secondary | ICD-10-CM | POA: Insufficient documentation

## 2021-03-03 DIAGNOSIS — N898 Other specified noninflammatory disorders of vagina: Secondary | ICD-10-CM | POA: Insufficient documentation

## 2021-03-03 HISTORY — DX: Other specified soft tissue disorders: M79.89

## 2021-03-03 NOTE — Assessment & Plan Note (Signed)
On zoloft.  Followed by psychiatry.  Appears to be stable.

## 2021-03-03 NOTE — Assessment & Plan Note (Signed)
Continue dexilant and pepcid.

## 2021-03-03 NOTE — Assessment & Plan Note (Signed)
Continue lipitor.  Low cholesterol diet and exercise.  Follow lipid panel and liver function tests.   

## 2021-03-03 NOTE — Assessment & Plan Note (Signed)
S/p surgery.  Being followed by neurology.

## 2021-03-03 NOTE — Assessment & Plan Note (Signed)
Follow cbc.  

## 2021-03-03 NOTE — Assessment & Plan Note (Signed)
Low carb diet and exercise.  Follow met b and a1c.  

## 2021-03-03 NOTE — Assessment & Plan Note (Signed)
CPAP.  

## 2021-03-03 NOTE — Assessment & Plan Note (Signed)
On zoloft.  200mg  now.  Continue f/u with psychiatry.

## 2021-03-03 NOTE — Assessment & Plan Note (Signed)
Saw Dr Bary Castilla.  Continue miralax.  Follow.

## 2021-03-03 NOTE — Assessment & Plan Note (Signed)
Seeing psychiatry.  Appears to be stable.

## 2021-03-03 NOTE — Assessment & Plan Note (Signed)
Followed by neurology.  Medications as outlined.  Stable.

## 2021-03-03 NOTE — Assessment & Plan Note (Signed)
Followed by pain clinic.  

## 2021-03-03 NOTE — Assessment & Plan Note (Signed)
Discussed diet and exercise as tolerated.  She is limited with her exercise.  She is interested in prescription medication to help with weight loss.  CCM referral.

## 2021-03-03 NOTE — Assessment & Plan Note (Signed)
Unclear etiology.  Question if assymetric secondary to underlying back and hip issues.  Will have ortho evaluate.

## 2021-03-03 NOTE — Assessment & Plan Note (Addendum)
Could not visualize a cut.  KOH/wet prep obtained.  Nystatin cream.

## 2021-03-06 ENCOUNTER — Telehealth: Payer: Self-pay

## 2021-03-06 LAB — CERVICOVAGINAL ANCILLARY ONLY
Bacterial Vaginitis (gardnerella): NEGATIVE
Candida Glabrata: NEGATIVE
Candida Vaginitis: POSITIVE — AB
Comment: NEGATIVE
Comment: NEGATIVE
Comment: NEGATIVE

## 2021-03-06 NOTE — Chronic Care Management (AMB) (Signed)
  Chronic Care Management   Outreach Note  03/06/2021 Name: Kathleen Everett MRN: [id number] DOB: October 10, 1971  Kathleen Everett is a 50 y.o. year old female who is a primary care patient of [name], MD. I reached out to [name] by phone today in response to a referral sent by Ms. Darius Bump Colston's PCP, [name], MD     An unsuccessful telephone outreach was attempted today. The patient was referred to the case management team for assistance with care management and care coordination.   Follow Up Plan: A HIPAA compliant phone message was left for the patient providing contact information and requesting a return call.  The care management team will reach out to the patient again over the next 5 days.  If patient returns call to provider office, please advise to call [facility]  at [place], [address], [place], [place] [address] Direct Dial: [phone] [email or web address]: [email or web address]

## 2021-03-07 NOTE — Chronic Care Management (AMB) (Signed)
  Chronic Care Management   Note  03/07/2021 Name: Justise Ehmann MRN: [id number] DOB: [birth year]  Kathleen Everett is a 50 y.o. year old female who is a primary care patient of [name], MD. I reached out to [name] by phone today in response to a referral sent by Ms. Darius Bump [place]'s PCP, [name], MD     Ms. Kennard was given information about Chronic Care Management services today including:  1. CCM service includes personalized support from designated clinical staff supervised by her physician, including individualized plan of care and coordination with other care providers 2. 24/7 contact phone numbers for assistance for urgent and routine care needs. 3. Service will only be billed when office clinical staff spend 20 minutes or more in a month to coordinate care. 4. Only one practitioner may furnish and bill the service in a calendar month. 5. The patient may stop CCM services at any time (effective at the end of the month) by phone call to the office staff. 6. The patient will be responsible for cost sharing (co-pay) of up to 20% of the service fee (after annual deductible is met).  Patient agreed to services and verbal consent obtained.   Follow up plan: Telephone appointment with care management team member scheduled for:03/28/2021  Noreene Larsson, [place], [place], [place] [address] Direct Dial: [phone] [email or web address] Website: [email or web address]

## 2021-03-16 ENCOUNTER — Encounter: Payer: Self-pay | Admitting: Internal Medicine

## 2021-03-16 NOTE — Telephone Encounter (Signed)
If persistent, will need to evaluate and see what is going on. Can use the monistat more days, but if not helping - sounds like needs to be seen.

## 2021-03-17 ENCOUNTER — Emergency Department
Admission: EM | Admit: 2021-03-17 | Discharge: 2021-03-17 | Disposition: A | Discharge status: Home / Self Care | Payer: Medicare HMO | Attending: Emergency Medicine | Admitting: Emergency Medicine

## 2021-03-17 ENCOUNTER — Emergency Department: Payer: Medicare HMO

## 2021-03-17 ENCOUNTER — Other Ambulatory Visit: Payer: Self-pay

## 2021-03-17 DIAGNOSIS — W01198A Fall on same level from slipping, tripping and stumbling with subsequent striking against other object, initial encounter: Secondary | ICD-10-CM | POA: Diagnosis not present

## 2021-03-17 DIAGNOSIS — M549 Dorsalgia, unspecified: Secondary | ICD-10-CM | POA: Insufficient documentation

## 2021-03-17 DIAGNOSIS — Y92009 Unspecified place in unspecified non-institutional (private) residence as the place of occurrence of the external cause: Secondary | ICD-10-CM | POA: Insufficient documentation

## 2021-03-17 DIAGNOSIS — R0781 Pleurodynia: Secondary | ICD-10-CM | POA: Diagnosis not present

## 2021-03-17 DIAGNOSIS — W19XXXA Unspecified fall, initial encounter: Secondary | ICD-10-CM

## 2021-03-17 DIAGNOSIS — G8929 Other chronic pain: Secondary | ICD-10-CM | POA: Diagnosis not present

## 2021-03-17 DIAGNOSIS — S8992XA Unspecified injury of left lower leg, initial encounter: Secondary | ICD-10-CM | POA: Diagnosis not present

## 2021-03-17 DIAGNOSIS — Z79899 Other long term (current) drug therapy: Secondary | ICD-10-CM | POA: Insufficient documentation

## 2021-03-17 DIAGNOSIS — M25552 Pain in left hip: Secondary | ICD-10-CM | POA: Insufficient documentation

## 2021-03-17 DIAGNOSIS — Z96641 Presence of right artificial hip joint: Secondary | ICD-10-CM | POA: Insufficient documentation

## 2021-03-17 DIAGNOSIS — S79912A Unspecified injury of left hip, initial encounter: Secondary | ICD-10-CM | POA: Diagnosis not present

## 2021-03-17 DIAGNOSIS — M25562 Pain in left knee: Secondary | ICD-10-CM | POA: Diagnosis not present

## 2021-03-17 DIAGNOSIS — S299XXA Unspecified injury of thorax, initial encounter: Secondary | ICD-10-CM | POA: Diagnosis not present

## 2021-03-17 MED ORDER — OXYCODONE HCL 5 MG PO TABS
10.0000 mg | ORAL_TABLET | Freq: Once | ORAL | Status: AC
Start: 2021-03-17 — End: 2021-03-17
  Administered 2021-03-17: 10 mg via ORAL
  Filled 2021-03-17: qty 2

## 2021-03-17 NOTE — Discharge Instructions (Addendum)
You were seen today for left rib pain, left hip pain and left knee pain status post fall.  Your x-rays were negative for acute fractures.  We recommend rest, ice.  You may take your oxycodone as previously prescribed at home for pain as needed.  Please follow-up with your PCP if symptoms persist or worsen.
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
## 2021-03-17 NOTE — ED Triage Notes (Signed)
Pt arrived from home with c/o L rib and hip pain from mechanical fall over a cinder block. Pt states she is unsure if hit head or LOC. Pt denies blood thinner use.   Pt A&Ox4 at this time, answers questions appropriately. Pt states 10/10 pain that gets worse when taking deep breaths.

## 2021-03-19 ENCOUNTER — Encounter
Admission: RE | Disposition: A | Discharge status: Home / Self Care | Payer: Self-pay | Source: Home / Self Care | Attending: Gastroenterology

## 2021-03-19 ENCOUNTER — Ambulatory Visit: Payer: Medicare HMO | Admitting: Anesthesiology

## 2021-03-19 ENCOUNTER — Ambulatory Visit
Admission: RE | Admit: 2021-03-19 | Discharge: 2021-03-19 | Disposition: A | Discharge status: Home / Self Care | Payer: Medicare HMO | Attending: Gastroenterology | Admitting: Gastroenterology

## 2021-03-19 ENCOUNTER — Encounter: Payer: Self-pay | Admitting: *Deleted

## 2021-03-19 DIAGNOSIS — K640 First degree hemorrhoids: Secondary | ICD-10-CM | POA: Diagnosis not present

## 2021-03-19 DIAGNOSIS — Z800 Family history of malignant neoplasm of digestive organs: Secondary | ICD-10-CM | POA: Diagnosis not present

## 2021-03-19 DIAGNOSIS — Z881 Allergy status to other antibiotic agents status: Secondary | ICD-10-CM | POA: Diagnosis not present

## 2021-03-19 DIAGNOSIS — Z880 Allergy status to penicillin: Secondary | ICD-10-CM | POA: Diagnosis not present

## 2021-03-19 DIAGNOSIS — E669 Obesity, unspecified: Secondary | ICD-10-CM | POA: Diagnosis not present

## 2021-03-19 DIAGNOSIS — F32A Depression, unspecified: Secondary | ICD-10-CM | POA: Insufficient documentation

## 2021-03-19 DIAGNOSIS — E785 Hyperlipidemia, unspecified: Secondary | ICD-10-CM | POA: Insufficient documentation

## 2021-03-19 DIAGNOSIS — Z888 Allergy status to other drugs, medicaments and biological substances status: Secondary | ICD-10-CM | POA: Diagnosis not present

## 2021-03-19 DIAGNOSIS — Z1211 Encounter for screening for malignant neoplasm of colon: Secondary | ICD-10-CM | POA: Diagnosis not present

## 2021-03-19 DIAGNOSIS — Z8371 Family history of colonic polyps: Secondary | ICD-10-CM | POA: Diagnosis not present

## 2021-03-19 DIAGNOSIS — Z6841 Body Mass Index (BMI) 40.0 and over, adult: Secondary | ICD-10-CM | POA: Insufficient documentation

## 2021-03-19 DIAGNOSIS — K449 Diaphragmatic hernia without obstruction or gangrene: Secondary | ICD-10-CM | POA: Diagnosis not present

## 2021-03-19 DIAGNOSIS — Z882 Allergy status to sulfonamides status: Secondary | ICD-10-CM | POA: Insufficient documentation

## 2021-03-19 DIAGNOSIS — K219 Gastro-esophageal reflux disease without esophagitis: Secondary | ICD-10-CM | POA: Diagnosis not present

## 2021-03-19 DIAGNOSIS — Z79899 Other long term (current) drug therapy: Secondary | ICD-10-CM | POA: Insufficient documentation

## 2021-03-19 DIAGNOSIS — Z86010 Personal history of colonic polyps: Secondary | ICD-10-CM | POA: Diagnosis not present

## 2021-03-19 DIAGNOSIS — R519 Headache, unspecified: Secondary | ICD-10-CM | POA: Insufficient documentation

## 2021-03-19 DIAGNOSIS — K649 Unspecified hemorrhoids: Secondary | ICD-10-CM | POA: Diagnosis not present

## 2021-03-19 HISTORY — PX: COLONOSCOPY WITH PROPOFOL: SHX5780

## 2021-03-19 HISTORY — PX: ESOPHAGOGASTRODUODENOSCOPY (EGD) WITH PROPOFOL: SHX5813

## 2021-03-19 SURGERY — ESOPHAGOGASTRODUODENOSCOPY (EGD) WITH PROPOFOL
Anesthesia: General

## 2021-03-19 MED ORDER — MIDAZOLAM HCL 2 MG/2ML IJ SOLN
INTRAMUSCULAR | Status: AC
  Filled 2021-03-19: qty 2

## 2021-03-19 MED ORDER — DEXMEDETOMIDINE (PRECEDEX) IN NS 20 MCG/5ML (4 MCG/ML) IV SYRINGE
PREFILLED_SYRINGE | INTRAVENOUS | Status: AC
  Filled 2021-03-19: qty 5

## 2021-03-19 MED ORDER — EPHEDRINE SULFATE 50 MG/ML IJ SOLN
INTRAMUSCULAR | Status: DC | PRN
  Administered 2021-03-19: 10 mg via INTRAVENOUS

## 2021-03-19 MED ORDER — FENTANYL CITRATE (PF) 100 MCG/2ML IJ SOLN
INTRAMUSCULAR | Status: DC | PRN
  Administered 2021-03-19 (×2): 50 ug via INTRAVENOUS

## 2021-03-19 MED ORDER — LIDOCAINE HCL (PF) 2 % IJ SOLN
INTRAMUSCULAR | Status: AC
  Filled 2021-03-19: qty 5

## 2021-03-19 MED ORDER — FENTANYL CITRATE (PF) 100 MCG/2ML IJ SOLN
INTRAMUSCULAR | Status: AC
  Filled 2021-03-19: qty 2

## 2021-03-19 MED ORDER — DEXMEDETOMIDINE HCL 200 MCG/2ML IV SOLN
INTRAVENOUS | Status: DC | PRN
Start: 2021-03-19 — End: 2021-03-19
  Administered 2021-03-19: 8 ug via INTRAVENOUS
  Administered 2021-03-19: 12 ug via INTRAVENOUS

## 2021-03-19 MED ORDER — LIDOCAINE HCL (CARDIAC) PF 100 MG/5ML IV SOSY
PREFILLED_SYRINGE | INTRAVENOUS | Status: DC | PRN
  Administered 2021-03-19: 100 mg via INTRAVENOUS

## 2021-03-19 MED ORDER — MIDAZOLAM HCL 2 MG/2ML IJ SOLN
INTRAMUSCULAR | Status: DC | PRN
  Administered 2021-03-19 (×2): 2 mg via INTRAVENOUS

## 2021-03-19 MED ORDER — EPHEDRINE 5 MG/ML INJ
INTRAVENOUS | Status: AC
  Filled 2021-03-19: qty 10

## 2021-03-19 MED ORDER — PROPOFOL 10 MG/ML IV BOLUS
INTRAVENOUS | Status: DC | PRN
  Administered 2021-03-19: 50 mg via INTRAVENOUS

## 2021-03-19 MED ORDER — SODIUM CHLORIDE 0.9 % IV SOLN
INTRAVENOUS | Status: DC
  Administered 2021-03-19: 20 mL/h via INTRAVENOUS

## 2021-03-19 MED ORDER — ONDANSETRON HCL 4 MG/2ML IJ SOLN
INTRAMUSCULAR | Status: AC
  Filled 2021-03-19: qty 2

## 2021-03-19 MED ORDER — PROPOFOL 500 MG/50ML IV EMUL
INTRAVENOUS | Status: DC | PRN
  Administered 2021-03-19: 50 ug/kg/min via INTRAVENOUS

## 2021-03-19 MED ORDER — PROPOFOL 500 MG/50ML IV EMUL
INTRAVENOUS | Status: AC
  Filled 2021-03-19: qty 50

## 2021-03-19 MED ORDER — ONDANSETRON HCL 4 MG/2ML IJ SOLN
INTRAMUSCULAR | Status: DC | PRN
  Administered 2021-03-19: 4 mg via INTRAVENOUS

## 2021-03-19 NOTE — Anesthesia Preprocedure Evaluation (Signed)
Anesthesia Evaluation  Patient identified by MRN, date of birth, ID band Patient awake    Reviewed: Allergy & Precautions, H&P , NPO status , Patient's Chart, lab work & pertinent test results  History of Anesthesia Complications (+) PONV and history of anesthetic complications  Airway Mallampati: III  TM Distance: <3 FB Neck ROM: limited    Dental  (+) Chipped   Pulmonary sleep apnea , neg recent URI,    Pulmonary exam normal        Cardiovascular Exercise Tolerance: Good (-) angina(-) Past MI Normal cardiovascular exam     Neuro/Psych  Headaches, neg Seizures PSYCHIATRIC DISORDERS Anxiety Depression Chiari malformation s/p surgical decompression    GI/Hepatic Neg liver ROS, GERD  Controlled,  Endo/Other  negative endocrine ROS  Renal/GU      Musculoskeletal   Abdominal   Peds  Hematology negative hematology ROS (+)   Anesthesia Other Findings BMI    Body Mass Index: 40.73 kg/m    Past Medical History: No date: Back pain No date: Chiari malformation     Comment:  s/p sgy decompression No date: Chronic tension headaches No date: Complication of anesthesia No date: DDD (degenerative disc disease), lumbar No date: Depression No date: Diverticulitis No date: Dysmenorrhea No date: Endometriosis No date: Gas pain No date: GERD (gastroesophageal reflux disease) No date: Hidradenitis No date: Hyperlipidemia No date: Obesity No date: Osteoarthritis No date: Ovarian cyst     Comment:  hx No date: PONV (postoperative nausea and vomiting) No date: Sleep apnea     Comment:  central sleep apnea  No CPAP right now No date: Vitamin B12 deficiency     Comment:  On injections  Past Surgical History: 03/27/07: ABDOMINAL HYSTERECTOMY     Comment:  laparoscopic 2004: BREAST EXCISIONAL BIOPSY; Left     Comment:  fibroadenoma No date: CESAREAN SECTION     Comment:  1999/2004 twins No date: chiari  decompression 04/28/2015: COLONOSCOPY WITH PROPOFOL; N/A     Comment:  Procedure: COLONOSCOPY WITH PROPOFOL;  Surgeon: Manya Silvas, MD;  Location: [place];  Service:               Endoscopy;  Laterality: N/A; No date: CYSTOSCOPY     Comment:  bladder ulcers 04/28/2015: ESOPHAGOGASTRODUODENOSCOPY; N/A     Comment:  Procedure: ESOPHAGOGASTRODUODENOSCOPY (EGD);  Surgeon:               Manya Silvas, MD;  Location: [facility] ENDOSCOPY;                Service: Endoscopy;  Laterality: N/A; 2003: HERNIA REPAIR; Right     Comment:  inguinal 2001: OVARIAN CYST REMOVAL     Comment:  right 2000: TEMPOROMANDIBULAR JOINT SURGERY No date: TUBAL LIGATION     Comment:  Bilateral  BMI    Body Mass Index: 38.47 kg/m      Reproductive/Obstetrics negative OB ROS                             Anesthesia Physical  Anesthesia Plan  ASA: III  Anesthesia Plan: General   Post-op Pain Management:    Induction: Intravenous  PONV Risk Score and Plan: Propofol infusion and TIVA  Airway Management Planned: Natural Airway and Nasal Cannula  Additional Equipment:   Intra-op Plan:   Post-operative Plan:   Informed Consent: I have reviewed  the patients History and Physical, chart, labs and discussed the procedure including the risks, benefits and alternatives for the proposed anesthesia with the patient or authorized representative who has indicated his/her understanding and acceptance.     Dental Advisory Given  Plan Discussed with: Anesthesiologist, CRNA and Surgeon  Anesthesia Plan Comments: (Patient consented for risks of anesthesia including but not limited to:  - adverse reactions to medications - risk of airway placement if required - damage to eyes, teeth, lips or other oral mucosa - nerve damage due to positioning  - sore throat or hoarseness - Damage to heart, brain, nerves, lungs, other parts of body or loss of life  Patient voiced  understanding.)        Anesthesia Quick Evaluation

## 2021-03-19 NOTE — Interval H&P Note (Signed)
History and Physical Interval Note:  03/19/2021 10:58 AM  Kathleen Everett  has presented today for surgery, with the diagnosis of History of polyps, GERD.  The various methods of treatment have been discussed with the patient and family. After consideration of risks, benefits and other options for treatment, the patient has consented to  Procedure(s): ESOPHAGOGASTRODUODENOSCOPY (EGD) WITH PROPOFOL (N/A) COLONOSCOPY WITH PROPOFOL (N/A) as a surgical intervention.  The patient's history has been reviewed, patient examined, no change in status, stable for surgery.  I have reviewed the patient's chart and labs.  Questions were answered to the patient's satisfaction.     Lesly Rubenstein  Ok to proceed with EGD/Colonoscopy

## 2021-03-19 NOTE — Transfer of Care (Signed)
Immediate Anesthesia Transfer of Care Note  Patient: Kathleen Everett  Procedure(s) Performed: ESOPHAGOGASTRODUODENOSCOPY (EGD) WITH PROPOFOL (N/A ) COLONOSCOPY WITH PROPOFOL (N/A )  Patient Location: PACU  Anesthesia Type:General  Level of Consciousness: sedated  Airway & Oxygen Therapy: Patient Spontanous Breathing and Patient connected to nasal cannula oxygen  Post-op Assessment: Report given to RN and Post -op Vital signs reviewed and stable  Post vital signs: Reviewed and stable  Last Vitals:  Vitals Value Taken Time  BP 109/63 03/19/21 1136  Temp 36 C 03/19/21 1135  Pulse 68 03/19/21 1136  Resp 11 03/19/21 1136  SpO2 99 % 03/19/21 1136  Vitals shown include unvalidated device data.  Last Pain:  Vitals:   03/19/21 1135  TempSrc: Tympanic  PainSc: Asleep         Complications: No complications documented.

## 2021-03-19 NOTE — Telephone Encounter (Signed)
Patient stated that the monistat did not help her at all. She says she never really felt any discomfort on the inside. She could give me a very good description about where the discomfort is- just that it is no better or worse. She was on the way home from having a colonoscopy done.

## 2021-03-19 NOTE — Op Note (Signed)
[facility] Patient Name: Kathleen Everett Procedure Date: 03/19/2021 10:41 AM MRN: [id number] Account #: [id number] Date of Birth: November 13, 1971 Admit Type: Outpatient Age: 50 Room: [facility] ENDO ROOM 3 Gender: Female Note Status: Finalized Procedure:             Colonoscopy Indications:           Screening for colon cancer: Family history of                         colorectal cancer in distant relative(s) Providers:             Andrey Farmer MD, MD Medicines:             Monitored Anesthesia Care Complications:         No immediate complications. Procedure:             Pre-Anesthesia Assessment:                        - Prior to the procedure, a History and Physical was                         performed, and patient medications and allergies were                         reviewed. The patient is competent. The risks and                         benefits of the procedure and the sedation options and                         risks were discussed with the patient. All questions                         were answered and informed consent was obtained.                         Patient identification and proposed procedure were                         verified by the physician, the nurse, the anesthetist                         and the technician in the endoscopy suite. Mental                         Status Examination: alert and oriented. Airway                         Examination: normal oropharyngeal airway and neck                         mobility. Respiratory Examination: clear to                         auscultation. CV Examination: normal. Prophylactic                         Antibiotics: The patient does not require prophylactic  antibiotics. Prior Anticoagulants: The patient has                         taken no previous anticoagulant or antiplatelet                         agents. ASA Grade Assessment: III - A patient with                          severe systemic disease. After reviewing the risks and                         benefits, the patient was deemed in satisfactory                         condition to undergo the procedure. The anesthesia                         plan was to use monitored anesthesia care (MAC).                         Immediately prior to administration of medications,                         the patient was re-assessed for adequacy to receive                         sedatives. The heart rate, respiratory rate, oxygen                         saturations, blood pressure, adequacy of pulmonary                         ventilation, and response to care were monitored                         throughout the procedure. The physical status of the                         patient was re-assessed after the procedure.                        After obtaining informed consent, the colonoscope was                         passed under direct vision. Throughout the procedure,                         the patient's blood pressure, pulse, and oxygen                         saturations were monitored continuously. The                         Colonoscope was introduced through the anus and                         advanced to the the terminal ileum. The colonoscopy  was performed without difficulty. The patient                         tolerated the procedure well. The quality of the bowel                         preparation was good. Findings:      The perianal and digital rectal examinations were normal.      The terminal ileum appeared normal.      Internal hemorrhoids were found during retroflexion. The hemorrhoids       were Grade I (internal hemorrhoids that do not prolapse).      The exam was otherwise without abnormality on direct and retroflexion       views. Impression:            - The examined portion of the ileum was normal.                        - Internal hemorrhoids.                         - The examination was otherwise normal on direct and                         retroflexion views.                        - No specimens collected. Recommendation:        - Discharge patient to home.                        - Resume previous diet.                        - Continue present medications.                        - Repeat colonoscopy in 5 years for screening purposes.                        - Return to referring physician as previously                         scheduled. Procedure Code(s):     --- Professional ---                        F1638, Colorectal cancer screening; colonoscopy on                         individual not meeting criteria for high risk Diagnosis Code(s):     --- Professional ---                        Z12.11, Encounter for screening for malignant neoplasm                         of colon                        Z80.0, Family history of malignant neoplasm of  digestive organs                        K64.0, First degree hemorrhoids CPT copyright 2019 American Medical Association. All rights reserved. The codes documented in this report are preliminary and upon coder review may  be revised to meet current compliance requirements. Andrey Farmer MD, MD 03/19/2021 11:37:53 AM Number of Addenda: 0 Note Initiated On: 03/19/2021 10:41 AM Scope Withdrawal Time: 0 hours 9 minutes 9 seconds  Total Procedure Duration: 0 hours 18 minutes 8 seconds  Estimated Blood Loss:  Estimated blood loss: none.      [facility]

## 2021-03-19 NOTE — H&P (Signed)
Outpatient short stay form Pre-procedure 03/19/2021 10:55 AM Kathleen Miyamoto MD, MPH  Primary Physician: Dr. Nicki Reaper  Reason for visit:  GERD/Family history of colon cancer  History of present illness:   50 y/o lady with history of HLD, obesity, headaches, and depression here for EGD/Colonoscopy for history of GERD refractory to PPI and colonoscopy for family history of colon cancer.    Current Facility-Administered Medications:  .  0.9 %  sodium chloride infusion, , Intravenous, Continuous, [place], Benay Pike, MD, Last Rate: 20 mL/hr at 03/19/21 1054, 20 mL/hr at 03/19/21 1054  Medications Prior to Admission  Medication Sig Dispense Refill Last Dose  . acetaminophen (TYLENOL) 500 MG tablet Take 500-1,000 mg by mouth 2 (two) times daily as needed for mild pain.    03/18/2021 at Unknown time  . AIMOVIG 140 MG/ML SOAJ    03/18/2021 at Unknown time  . atorvastatin (LIPITOR) 10 MG tablet Take 1 tablet by mouth once daily 30 tablet 0 03/18/2021 at Unknown time  . augmented betamethasone dipropionate (DIPROLENE-AF) 0.05 % cream Apply to hands twice a day as needed. Avoid applying to face, groin, and axilla. Use as directed. 50 g 0 03/18/2021 at Unknown time  . Crisaborole (EUCRISA) 2 % OINT Apply to hands twice a day Mon-Fri 60 g 2 03/18/2021 at Unknown time  . cyanocobalamin (,VITAMIN B-12,) 1000 MCG/ML injection INJECT ONE ML (CC) INTRAMUSCULARLY ONCE EVERY MONTH (Patient taking differently: Inject 1,000 mcg into the muscle every 30 (thirty) days.) 10 mL 1 Past Week at Unknown time  . diazepam (VALIUM) 5 MG tablet Take 5 mg by mouth every 6 (six) hours as needed for anxiety.   03/18/2021 at Unknown time  . diclofenac sodium (VOLTAREN) 1 % GEL Apply 1 application topically 4 (four) times daily as needed (pain.).    03/18/2021 at Unknown time  . esomeprazole (NEXIUM) 40 MG capsule Take 1 capsule by mouth once daily 90 capsule 0 03/18/2021 at Unknown time  . estradiol (ESTRACE VAGINAL) 0.1 MG/GM vaginal cream One  applicator q hs x 5 nights and then 2x/week. (Patient taking differently: Place 1 Applicatorful vaginally 2 (two) times a week.) 42.5 g 1 03/18/2021 at Unknown time  . fluorouracil (EFUDEX) 5 % cream Apply topically in the morning and at bedtime. To affected areas at hands 40 g 0 03/18/2021 at Unknown time  . gabapentin (NEURONTIN) 400 MG capsule Take 400 mg by mouth 3 (three) times daily.   03/18/2021 at Unknown time  . ibuprofen (ADVIL) 800 MG tablet Take 800 mg by mouth 3 (three) times daily as needed (Pain).    Past Week at Unknown time  . lactase (RA DAIRY AID) 3000 units tablet Take 9,000 Units by mouth 3 (three) times daily as needed (with dairy consumption).    03/18/2021 at Unknown time  . methocarbamol (ROBAXIN) 500 MG tablet Take 500 mg by mouth 4 (four) times daily.   03/18/2021 at Unknown time  . mupirocin ointment (BACTROBAN) 2 % Apply 1 application topically daily. 22 g 0 03/18/2021 at Unknown time  . NARCAN 4 MG/0.1ML LIQD nasal spray kit Place 0.4 mg into the nose once.   Past Month at Unknown time  . NON FORMULARY Take 2 capsules by mouth daily. Thrive vitamins with shake and derma patch   03/18/2021 at Unknown time  . nystatin cream (MYCOSTATIN) APPLY TOPICALLY TWICE DAILY 30 g 0 03/18/2021 at Unknown time  . Oxycodone HCl 10 MG TABS Take 10 mg by mouth 5 (five) times daily.  0 03/19/2021 at 0530  . rizatriptan (MAXALT) 10 MG tablet Take 10 mg by mouth as needed for migraine. May repeat in 2 hours if needed   03/18/2021 at Unknown time  . Simethicone (GAS RELIEF PO) Take 125 mg by mouth 2 (two) times daily as needed for flatulence. Anti gas   03/18/2021 at Unknown time  . topiramate (TOPAMAX) 25 MG tablet Take 25 mg by mouth 2 (two) times daily.   03/18/2021 at Unknown time  . triamcinolone cream (KENALOG) 0.1 % APPLY  CREAM EXTERNALLY TWICE DAILY TO AFFECTED AREA ON  FINGERS 30 g 0 03/18/2021 at Unknown time  . Ascorbic Acid (VITAMIN C) 500 MG CHEW Chew 500 mg by mouth daily.     . furosemide (LASIX) 20  MG tablet Take 1 tablet (20 mg total) by mouth daily as needed (As needed for leg swelling). 90 tablet 3   . potassium chloride (KLOR-CON) 10 MEQ tablet Take 1 tablet (10 mEq total) by mouth daily as needed (As needed with Furosemide (Lasix)). 90 tablet 3   . sertraline (ZOLOFT) 100 MG tablet Take 1.5 tablets (150 mg total) by mouth daily for 8 days, THEN 2 tablets (200 mg total) daily. 72 tablet 1      Allergies  Allergen Reactions  . Vancomycin Hives  . Ceftin [Cefuroxime Axetil] Other (See Comments)    Upset stomach  . Celexa [Citalopram Hydrobromide] Diarrhea    GI upset    . Clindamycin/Lincomycin Rash  . Penicillins Rash    Did it involve swelling of the face/tongue/throat, SOB, or low BP? No Did it involve sudden or severe rash/hives, skin peeling, or any reaction on the inside of your mouth or nose? Unknown Did you need to seek medical attention at a hospital or doctor's office? No When did it last happen?childhood reaction If all above answers are "NO", may proceed with cephalosporin use.   . Sulfa Antibiotics Rash     Past Medical History:  Diagnosis Date  . Anxiety   . Back pain   . Chiari malformation    s/p sgy decompression  . Chronic tension headaches   . Complication of anesthesia   . DDD (degenerative disc disease), lumbar   . Depression   . Diverticulitis   . Dysmenorrhea   . Dysplastic nevus 10/09/2015   R abdomen - moderate, limited margins free.  . Endometriosis   . Gas pain   . GERD (gastroesophageal reflux disease)   . Hidradenitis   . Hyperlipidemia   . Mitral valve prolapse   . Obesity   . Osteoarthritis   . Ovarian cyst    hx  . PONV (postoperative nausea and vomiting)   . Sleep apnea    central sleep apnea  No CPAP right now  . TMJ (dislocation of temporomandibular joint)   . Vitamin B12 deficiency    On injections    Review of systems:  Otherwise negative.    Physical Exam  Gen: Alert, oriented. Appears stated age.  HEENT:  PERRLA. Lungs: No respiratory distress CV: RRR Abd: soft, benign, no masses Ext: No edema    Planned procedures: Proceed with EGD/colonoscopy. The patient understands the nature of the planned procedure, indications, risks, alternatives and potential complications including but not limited to bleeding, infection, perforation, damage to internal organs and possible oversedation/side effects from anesthesia. The patient agrees and gives consent to proceed.  Please refer to procedure notes for findings, recommendations and patient disposition/instructions.     Kathleen Miyamoto MD,  [facility] 03/19/2021  10:55 AM

## 2021-03-19 NOTE — Anesthesia Postprocedure Evaluation (Signed)
Anesthesia Post Note  Patient: Kathleen Everett  Procedure(s) Performed: ESOPHAGOGASTRODUODENOSCOPY (EGD) WITH PROPOFOL (N/A ) COLONOSCOPY WITH PROPOFOL (N/A )  Patient location during evaluation: Endoscopy Anesthesia Type: General Level of consciousness: awake and alert Pain management: pain level controlled Vital Signs Assessment: post-procedure vital signs reviewed and stable Respiratory status: spontaneous breathing, nonlabored ventilation, respiratory function stable and patient connected to nasal cannula oxygen Cardiovascular status: blood pressure returned to baseline and stable Postop Assessment: no apparent nausea or vomiting Anesthetic complications: no   No complications documented.   Last Vitals:  Vitals:   03/19/21 1155 03/19/21 1205  BP: 111/62 122/71  Pulse: 68 63  Resp: 14 13  Temp:    SpO2: 96% 95%    Last Pain:  Vitals:   03/19/21 1205  TempSrc:   PainSc: 0-No pain                 [name]

## 2021-03-19 NOTE — Op Note (Signed)
[facility]astroenterology Patient Name: Kathleen Everett Procedure Date: 03/19/2021 10:41 AM MRN: [id number] Account #: [id number] Date of Birth: 11-23-1970 Admit Type: Outpatient Age: 50 Room: [facility] ENDO ROOM 3 Gender: Female Note Status: Finalized Procedure:             Upper GI endoscopy Indications:           Gastro-esophageal reflux disease Providers:             Andrey Farmer MD, MD Medicines:             Monitored Anesthesia Care Complications:         No immediate complications. Procedure:             Pre-Anesthesia Assessment:                        - Prior to the procedure, a History and Physical was                         performed, and patient medications and allergies were                         reviewed. The patient is competent. The risks and                         benefits of the procedure and the sedation options and                         risks were discussed with the patient. All questions                         were answered and informed consent was obtained.                         Patient identification and proposed procedure were                         verified by the physician, the nurse, the anesthetist                         and the technician in the endoscopy suite. Mental                         Status Examination: alert and oriented. Airway                         Examination: normal oropharyngeal airway and neck                         mobility. Respiratory Examination: clear to                         auscultation. CV Examination: normal. Prophylactic                         Antibiotics: The patient does not require prophylactic                         antibiotics. Prior Anticoagulants: The patient has  taken no previous anticoagulant or antiplatelet                         agents. ASA Grade Assessment: III - A patient with                         severe systemic disease. After reviewing the risks and                          benefits, the patient was deemed in satisfactory                         condition to undergo the procedure. The anesthesia                         plan was to use monitored anesthesia care (MAC).                         Immediately prior to administration of medications,                         the patient was re-assessed for adequacy to receive                         sedatives. The heart rate, respiratory rate, oxygen                         saturations, blood pressure, adequacy of pulmonary                         ventilation, and response to care were monitored                         throughout the procedure. The physical status of the                         patient was re-assessed after the procedure.                        After obtaining informed consent, the endoscope was                         passed under direct vision. Throughout the procedure,                         the patient's blood pressure, pulse, and oxygen                         saturations were monitored continuously. The Endoscope                         was introduced through the mouth, and advanced to the                         second part of duodenum. The upper GI endoscopy was                         somewhat difficult due to the patient's  body habitus.                         The patient tolerated the procedure well. Findings:      A small hiatal hernia was present.      The exam of the esophagus was otherwise normal.      The entire examined stomach was normal. Unable to get a good       retroflexion to look at fundus of stomach due to patient grabbing at       scope but on exam during withdrawal, no obvious abnormalities.      The examined duodenum was normal. Impression:            - Small hiatal hernia.                        - Normal stomach.                        - Normal examined duodenum.                        - No specimens collected. Recommendation:        - Perform a  colonoscopy today. Procedure Code(s):     --- Professional ---                        [phone], Esophagogastroduodenoscopy, flexible,                         transoral; diagnostic, including collection of                         specimen(s) by brushing or washing, when performed                         (separate procedure) Diagnosis Code(s):     --- Professional ---                        K44.9, Diaphragmatic hernia without obstruction or                         gangrene                        K21.9, Gastro-esophageal reflux disease without                         esophagitis CPT copyright 2019 American Medical Association. All rights reserved. The codes documented in this report are preliminary and upon coder review may  be revised to meet current compliance requirements. Andrey Farmer MD, MD 03/19/2021 11:35:31 AM Number of Addenda: 0 Note Initiated On: 03/19/2021 10:41 AM Estimated Blood Loss:  Estimated blood loss: none.      [facility]

## 2021-03-19 NOTE — Telephone Encounter (Signed)
If it is the persistent area that felt like a "cut" - I do recommend for her to see gyn.  If agreeable, let me know and I will place the order for the referral.

## 2021-03-20 ENCOUNTER — Encounter: Payer: Self-pay | Admitting: Gastroenterology

## 2021-03-20 ENCOUNTER — Encounter: Payer: Medicare HMO | Admitting: Psychology

## 2021-03-22 ENCOUNTER — Other Ambulatory Visit: Payer: Self-pay

## 2021-03-22 ENCOUNTER — Telehealth (INDEPENDENT_AMBULATORY_CARE_PROVIDER_SITE_OTHER): Payer: Medicare HMO | Admitting: Psychiatry

## 2021-03-22 DIAGNOSIS — F331 Major depressive disorder, recurrent, moderate: Secondary | ICD-10-CM

## 2021-03-22 DIAGNOSIS — F411 Generalized anxiety disorder: Secondary | ICD-10-CM

## 2021-03-22 MED ORDER — BUSPIRONE HCL 10 MG PO TABS
10.0000 mg | ORAL_TABLET | Freq: Every day | ORAL | 0 refills | Status: DC
Start: 2021-03-22 — End: 2021-05-31

## 2021-03-22 MED ORDER — SERTRALINE HCL 100 MG PO TABS
200.0000 mg | ORAL_TABLET | Freq: Every day | ORAL | 0 refills | Status: DC
Start: 2021-03-22 — End: 2021-05-31

## 2021-03-22 MED ORDER — FLUCONAZOLE 150 MG PO TABS: ORAL_TABLET | ORAL | 0 refills | Status: DC

## 2021-03-22 NOTE — Telephone Encounter (Signed)
rx sent in for diflucan  

## 2021-03-22 NOTE — Progress Notes (Signed)
Virtual Visit via Telephone Note  I connected with Kathleen Everett on 03/22/21 at 11:00 AM EDT by telephone and verified that I am speaking with the correct person using two identifiers.  Location: Patient: home Provider: office   I discussed the limitations, risks, security and privacy concerns of performing an evaluation and management service by telephone and the availability of in person appointments. I also discussed with the patient that there may be a patient responsible charge related to this service. The patient expressed understanding and agreed to proceed.   History of Present Illness: Kathleen Everett shares that she feel this past weekend. She went to the ED and was diagnosed with some bruised ribs. It is hard to move around. Her back pain prevents her from doing much physically. She wants to do so many things and it is frustrating her. It makes her irritable but she is not acting on it as much. Her anxiety goes up when things are not clean or done to her satisfaction. Some days she feels down and out. She feels depressed every day. Kathleen Everett is not sleeping well due to the pain and it makes her tired. She has a lot of negative self thoughts- she doesn't like the way she looks and her limited ability to move around.  She denies SI/HI. Kathleen Everett states she Zoloft is helping more than other antidepressants in the past.    Observations/Objective:  General Appearance: unable to assess  Eye Contact:  unable to assess  Speech:  Clear and Coherent and Normal Rate  Volume:  Normal  Mood:  Depressed  Affect:  Congruent  Thought Process:  Goal Directed, Linear and Descriptions of Associations: Intact  Orientation:  Full (Time, Place, and Person)  Thought Content:  Logical  Suicidal Thoughts:  No  Homicidal Thoughts:  No  Memory:  Immediate;   Good  Judgement:  Good  Insight:  Good  Psychomotor Activity: unable to assess  Concentration:  Concentration: Good  Recall:  Good  Fund of  Knowledge:  Good  Language:  Good  Akathisia:  unable to assess  Handed:  Right  AIMS (if indicated):     Assets:  Communication Skills Desire for Improvement Financial Resources/Insurance Housing Resilience Social Support Talents/Skills Transportation Vocational/Educational  ADL's:  unable to assess  Cognition:  WNL  Sleep:         Assessment and Plan: Depression screen [facility] 2/9 03/22/2021 02/08/2021 01/11/2021 06/26/2020 02/14/2020  Decreased Interest 1 3 1 3 2   Down, Depressed, Hopeless 3 3 2 3 2   PHQ - 2 Score 4 6 3 6 4   Altered sleeping 3 3 3 3 1   Tired, decreased energy 2 3 1 2 1   Change in appetite 0 0 1 2 0  Feeling bad or failure about yourself  3 2 1 3  0  Trouble concentrating 0 2 1 3 1   Moving slowly or fidgety/restless 0 2 0 3 1  Suicidal thoughts 0 1 0 1 0  PHQ-9 Score 12 19 10 23 8   Difficult doing work/chores Very difficult Very difficult Somewhat difficult Extremely dIfficult Somewhat difficult  Some recent data might be hidden    Flowsheet Row Video Visit from 03/22/2021 in [facility] Admission (Discharged) from 03/19/2021 in [place] ED from 03/17/2021 in [place] Error: Q3, 4, or 5 should not be populated when Q2 is No No Risk No Risk  1. MDD (major depressive disorder), recurrent episode, moderate (HCC) - sertraline (ZOLOFT) 100 MG tablet; Take 2 tablets (200 mg total) by mouth daily.  Dispense: 180 tablet; Refill: 0 - busPIRone (BUSPAR) 10 MG tablet; Take 1 tablet (10 mg total) by mouth at bedtime.  Dispense: 90 tablet; Refill: 0  2. Generalized anxiety disorder - sertraline (ZOLOFT) 100 MG tablet; Take 2 tablets (200 mg total) by mouth daily.  Dispense: 180 tablet; Refill: 0 - busPIRone (BUSPAR) 10 MG tablet; Take 1 tablet (10 mg total) by mouth at bedtime.  Dispense: 90 tablet; Refill: 0  - start Buspar for  augmentation - advised not to take Advil due to daily Zoloft use   Follow Up Instructions: In 2-3 months or sooner if needed   I discussed the assessment and treatment plan with the patient. The patient was provided an opportunity to ask questions and all were answered. The patient agreed with the plan and demonstrated an understanding of the instructions.   The patient was advised to call back or seek an in-person evaluation if the symptoms worsen or if the condition fails to improve as anticipated.  I provided 17 minutes of non-face-to-face time during this encounter.   Charlcie Cradle, MD

## 2021-03-27 ENCOUNTER — Encounter: Payer: Medicare HMO | Admitting: Psychology

## 2021-03-28 ENCOUNTER — Ambulatory Visit (INDEPENDENT_AMBULATORY_CARE_PROVIDER_SITE_OTHER): Payer: Medicare HMO | Admitting: Pharmacist

## 2021-03-28 ENCOUNTER — Ambulatory Visit: Payer: Self-pay | Admitting: Dermatology

## 2021-03-28 DIAGNOSIS — M545 Low back pain, unspecified: Secondary | ICD-10-CM

## 2021-03-28 DIAGNOSIS — F320 Major depressive disorder, single episode, mild: Secondary | ICD-10-CM

## 2021-03-28 DIAGNOSIS — R739 Hyperglycemia, unspecified: Secondary | ICD-10-CM

## 2021-03-28 DIAGNOSIS — F32A Depression, unspecified: Secondary | ICD-10-CM

## 2021-03-28 DIAGNOSIS — E7800 Pure hypercholesterolemia, unspecified: Secondary | ICD-10-CM | POA: Diagnosis not present

## 2021-03-28 DIAGNOSIS — Z6841 Body Mass Index (BMI) 40.0 and over, adult: Secondary | ICD-10-CM

## 2021-03-28 DIAGNOSIS — K649 Unspecified hemorrhoids: Secondary | ICD-10-CM

## 2021-03-28 DIAGNOSIS — K219 Gastro-esophageal reflux disease without esophagitis: Secondary | ICD-10-CM

## 2021-03-28 DIAGNOSIS — G43809 Other migraine, not intractable, without status migrainosus: Secondary | ICD-10-CM

## 2021-03-28 MED ORDER — OZEMPIC (0.25 OR 0.5 MG/DOSE) 2 MG/1.5ML ~~LOC~~ SOPN: 0.5000 mg | PEN_INJECTOR | SUBCUTANEOUS | 2 refills | Status: DC

## 2021-03-28 NOTE — Patient Instructions (Signed)
Visit Information   PATIENT GOALS:  Goals Addressed              This Visit's Progress     Patient Stated   .  Medication Monitoring (pt-stated)        Patient Goals/Self-Care Activities . Over the next 90 days, patient will:  - take medications as prescribed collaborate with provider on medication access solutions engage in dietary modifications by moderating portion sizes        Consent to CCM Services: Ms. Nordhoff was given information about Chronic Care Management services today including:  1. CCM service includes personalized support from designated clinical staff supervised by her physician, including individualized plan of care and coordination with other care providers 2. 24/7 contact phone numbers for assistance for urgent and routine care needs. 3. Service will only be billed when office clinical staff spend 20 minutes or more in a month to coordinate care. 4. Only one practitioner may furnish and bill the service in a calendar month. 5. The patient may stop CCM services at any time (effective at the end of the month) by phone call to the office staff. 6. The patient will be responsible for cost sharing (co-pay) of up to 20% of the service fee (after annual deductible is met).  Patient agreed to services and verbal consent obtained.   Patient verbalizes understanding of instructions provided today and agrees to view in MyChart.  Plan: Telephone follow up appointment with care management team member scheduled for:  ~ 6 weeks  Catie , PharmD, BCACP, CPP Clinical Pharmacist [facility] [place] [phone]  CLINICAL CARE PLAN: Patient Care Plan: Medication Management    Problem Identified: Obesity, Chronic Pain, Migraines, Depression     Long-Range Goal: Disease Progression Prevention   Start Date: 03/28/2021  This Visit's Progress: On track  Priority: High  Note:   Current Barriers:  . Unable to achieve control of weight   . Complex patient with multiple comorbidities at a high risk for exacerbation and hospitalization  Pharmacist Clinical Goal(s):  . Over the next 90 days, patient will achieve improvement of weight through collaboration with PharmD and provider.   Interventions: . 1:1 collaboration with Scott, Charlene, MD regarding development and update of comprehensive plan of care as evidenced by provider attestation and co-signature . Inter-disciplinary care team collaboration (see longitudinal plan of care) . Comprehensive medication review performed; medication list updated in electronic medical record  SDOH: . Reports they just got a new puppy.  . No medication cost concerns, Medicare + Medicaid   Hyperglycemia, Obesity: . Unable to achieve weight loss through diet and exercise alone; o Hx bariatric clinic, but decided not to proceed with surgery - benefited from the nutrition class, but husband did not want her to pursue surgery o Hx bupropion, ineffective for mental health/weight management o Hx topiramate, intolerable side effects of GI upset, CNS depression o Hx phentermine years ago  . Current meal patterns: breakfast: skips some mornings, sometimes pop tart, sometimes waffles, sometimes protein shake, yogurt, nutrigrain bar, egg sometimes;  lunch: chicken salad, salad, sandwiches, pre-packaged tuna; dinner: brats, sometimes hamburger helper, french fries; infrequent take out, pizza occasionally, frozen vegetables; snacks/desserts: not typically, sometimes a small serving of ice cream; drinks: coffee (2 cups), water . Current exercise: limited by chronic pain; looking forward to walking the puppy more moving forward. Pain worsens if walking for >5-10 minutes, feet swell.  . Discussed preferable low carb options for meals, such as hard boiled   eggs, greek yogurt. Discussed moderating portion sizes.  . Extensive education on GLP1, including mechanism of action, side effects, long term benefit.  Discussed potential for exacerbation of baseline indigestion, constipation. Patient understands this and would like to proceed with therapy. Confirmed that patient has no personal/family history of medullary thyroid cancer, pancreatitis, or gallbladder disease. Discussed that if intolerable GI effects occur, she will stop therapy and contact us.  . Discussed long term ramifications of off label Ozempic prescribing, as Medicare does not cover weight management indications. Patient verbalized understanding. PCP in agreement. Recommend starting Ozempic 0.25 mg weekly for 4 weeks, then increase to 0.5 mg weekly as tolerated. Patient verbalizes understanding. Attempted PA, no PA required for this medication. Script sent to the pharmacy.   Hyperlipidemia: . Controlled; current treatment: atorvastatin 10 mg daily  . Continue current regimen at this time  Depression/Anxiety . Uncontrolled; current treatment: sertraline 200 mg daily, buspirone 10 mg QPM just added- has not taken consistently since addition; follows w/ Dr. Doyne Keel o Per documentation, hx: citalopram, escitalopram, fluoxetine, venlafaxine, duloxetine, hydroxyzine, lithium, quetiapine, hydroxyzine, Trintellix . Discussed recommendation to take buspirone regularly. Patient verbalizes understanding. Recommend to continue current reigmen along with psychiatry collaboration  Chronic Pain: . Moderately well controlled; current regimen: oxycodone 10 mg 5 times daily, gabapentin 400 mg TID, ibuprofen 800 mg TID PRN, acetaminophen [phone] mg PRN, methocarbamol 500 mg 2-3 times daily PRN, topiramate 25 mg QPM - notes she stopped this due to exacerbation of GI upset, CNS depression; follows with [facility]  . Currently using Miralax every 3 days for constipation. Not regularly using senna/docusate . Has script for naloxone at home . Continue current regimen at this time. Hx intolerance to duloxetine.  . Advised use of senna/docusate  combination for opioid induced constipation. Encouraged hydration and fiber.   Migraines, Cognitive Dysfunction: . Moderately well controlled; current regimen: Ajovy 140 mg Q28 days, rizatriptan 10 mg PRN migraines, Ubrelvy 100 mg PRN migraines . Patient notes she has more often been using rizatriptan than Ubrelvy. Discussed lower risk of sedation with Ubrelvy . Recommend to continue current regimen at this time along with neurology collaboration  Dermatologic Conditions - Warts and Dermatitis: . Uncontrolled; current regimen: topical salicylic acid and topical flurouricil 5% for warts, topical Eucrisa on weekdays, topical triamcinolone on weekends - reports clobetasol was not covered; follows w/ Dr. Laurence Ferrari . Continue current regimen at this time  GERD, Indigestion: . Moderately well controlled; current regimen: esomeprazole 40 mg PRN . Reports that daily administration was insufficient to control symptoms. Continue current regimen at this time  Patient Goals/Self-Care Activities . Over the next 90 days, patient will:  - take medications as prescribed collaborate with provider on medication access solutions engage in dietary modifications by moderating portion sizes  Follow Up Plan: Telephone follow up appointment with care management team member scheduled for: ~ 6 weeks
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
## 2021-03-29 DIAGNOSIS — M7989 Other specified soft tissue disorders: Secondary | ICD-10-CM | POA: Diagnosis not present

## 2021-03-29 DIAGNOSIS — Z96641 Presence of right artificial hip joint: Secondary | ICD-10-CM | POA: Diagnosis not present

## 2021-04-03 DIAGNOSIS — M47816 Spondylosis without myelopathy or radiculopathy, lumbar region: Secondary | ICD-10-CM | POA: Diagnosis not present

## 2021-04-03 DIAGNOSIS — M25551 Pain in right hip: Secondary | ICD-10-CM | POA: Diagnosis not present

## 2021-04-03 DIAGNOSIS — M5442 Lumbago with sciatica, left side: Secondary | ICD-10-CM | POA: Diagnosis not present

## 2021-04-03 DIAGNOSIS — G44209 Tension-type headache, unspecified, not intractable: Secondary | ICD-10-CM | POA: Diagnosis not present

## 2021-04-03 DIAGNOSIS — M62830 Muscle spasm of back: Secondary | ICD-10-CM | POA: Diagnosis not present

## 2021-04-03 DIAGNOSIS — M160 Bilateral primary osteoarthritis of hip: Secondary | ICD-10-CM | POA: Diagnosis not present

## 2021-04-03 DIAGNOSIS — M5412 Radiculopathy, cervical region: Secondary | ICD-10-CM | POA: Diagnosis not present

## 2021-04-03 DIAGNOSIS — M1611 Unilateral primary osteoarthritis, right hip: Secondary | ICD-10-CM | POA: Diagnosis not present

## 2021-04-03 DIAGNOSIS — G2581 Restless legs syndrome: Secondary | ICD-10-CM | POA: Diagnosis not present

## 2021-04-09 ENCOUNTER — Other Ambulatory Visit: Payer: Self-pay | Admitting: Pediatrics

## 2021-04-09 ENCOUNTER — Other Ambulatory Visit: Payer: Self-pay | Admitting: Orthopedic Surgery

## 2021-04-09 DIAGNOSIS — M7989 Other specified soft tissue disorders: Secondary | ICD-10-CM

## 2021-04-10 ENCOUNTER — Other Ambulatory Visit: Payer: Self-pay | Admitting: Internal Medicine

## 2021-04-10 DIAGNOSIS — G935 Compression of brain: Secondary | ICD-10-CM | POA: Diagnosis not present

## 2021-04-10 DIAGNOSIS — G4733 Obstructive sleep apnea (adult) (pediatric): Secondary | ICD-10-CM | POA: Diagnosis not present

## 2021-04-10 DIAGNOSIS — G43019 Migraine without aura, intractable, without status migrainosus: Secondary | ICD-10-CM | POA: Diagnosis not present

## 2021-04-10 DIAGNOSIS — Z9989 Dependence on other enabling machines and devices: Secondary | ICD-10-CM | POA: Diagnosis not present

## 2021-04-13 ENCOUNTER — Other Ambulatory Visit: Payer: Medicare HMO

## 2021-04-15 ENCOUNTER — Other Ambulatory Visit: Payer: Self-pay | Admitting: Internal Medicine

## 2021-04-17 ENCOUNTER — Ambulatory Visit: Payer: Medicare HMO | Admitting: Psychology

## 2021-04-17 ENCOUNTER — Encounter: Payer: Self-pay | Admitting: Psychology

## 2021-04-17 ENCOUNTER — Ambulatory Visit (INDEPENDENT_AMBULATORY_CARE_PROVIDER_SITE_OTHER): Payer: Medicare HMO | Admitting: Psychology

## 2021-04-17 ENCOUNTER — Other Ambulatory Visit: Payer: Self-pay

## 2021-04-17 DIAGNOSIS — R4189 Other symptoms and signs involving cognitive functions and awareness: Secondary | ICD-10-CM

## 2021-04-17 DIAGNOSIS — F332 Major depressive disorder, recurrent severe without psychotic features: Secondary | ICD-10-CM | POA: Diagnosis not present

## 2021-04-17 DIAGNOSIS — E538 Deficiency of other specified B group vitamins: Secondary | ICD-10-CM | POA: Insufficient documentation

## 2021-04-17 DIAGNOSIS — F411 Generalized anxiety disorder: Secondary | ICD-10-CM | POA: Diagnosis not present

## 2021-04-17 NOTE — Progress Notes (Signed)
NEUROPSYCHOLOGICAL EVALUATION Kathleen Everett. [facility] Department of Neurology  Date of Evaluation: Apr 17, 2021  Reason for Referral:   Kathleen Everett is a 50 y.o. right-handed Caucasian female referred by Jennings Books, M.D., to characterize her current cognitive functioning and assist with diagnostic clarity and treatment planning in the context of subjective cognitive decline and several medical and psychiatric comorbidities.   Assessment and Plan:   Clinical Impression(s): Performance across stand-alone and embedded validity assessments were almost exclusively below expectation. While scores were above chance, they were below recommended cut-offs, even for individuals with established dementia diagnoses. As such, the results of the current evaluation are invalid as they are believed to underestimate true cognitive abilities to an unknown degree. This is most likely caused by ongoing severe psychiatric distress (see below) rather than poor engagement with testing procedures or attempts to perform poorly.   Broad patterns across cognitive testing reveal noteworthy performance variability across several domains (i.e., processing speed, expressive language) with more consistent areas of weakness surrounding executive functioning, fine motor coordination/speed, and both encoding (i.e., learning) and retrieval aspects of memory. Performance was appropriate across attention/concentration, receptive language, visuospatial abilities, and retention/consolidation aspects of memory. Of note, while cognitive testing should not be taken at face value, broad patterns of variability is a reasonable finding given the extent of her ongoing psychiatric distress. Comparisons to her prior evaluation in 2016 are unable to be performed due current validity concerns as well as her prior evaluation utilizing quite uncommon and inadequate (in my professional opinion) instrumentation. Given her age  and appropriate retention scores across memory tasks, I see no reason to have concerns surrounding an underlying neurocognitive disorder at the present time.   Across mood-related questionnaires, Ms. Dorman reported symptoms of moderate anxiety and severe depression occurring during the past 1-2 weeks. Across a more comprehensive personality questionnaire, she elevated clinical subscales surrounding somatic concerns, anxiety, and depression. Her responses suggested an individual with significant moodiness, unhappiness, and tension. She may view herself as ineffectual and powerless to change the direction of her life. She is likely plagued by thoughts of worthlessness, hopelessness, and personal failure. She openly admitted to feelings of sadness, loss of interest in normal activities, and loss of pleasure in activities that were previously pleasurable. Responses also indicate a discomforting level of anxiety to the extent that her ability to concentrate and attend are likely significantly compromised. She likely experiences a great deal of tension, has difficulty relaxing, and experiences fatigue as a result of high perceived stress. She also reported an unusual degree of concern surrounding physical functioning, health matters, and probable impairment from somatic symptoms. However, the latter is likely related to ongoing diffuse and chronic physical pain. Regarding self-concept, she appears to have a harsh, negative self-evaluation and is prone to being pessimistic and self-critical. Regarding treatment considerations, she fortunately reported a positive attitude towards the value of therapy and the importance of personal responsibility.   Diagnoses: Major depressive disorder, recurrent, severe; Generalized anxiety disorder. Rule-out: Attention deficit hyperactivity disorder (ADHD).  Recommendations: A combination of medication and psychotherapy has been shown to be most effective at treating symptoms  of anxiety and depression. Ms. Gilchrest did note that her medications were recently changed and was aware that it sometimes takes several weeks before they can be effective. She is encouraged to continue with these medications as prescribed. If she has not seen any noticeable benefit prior to her next follow-up with her psychiatrist, I would encourage her to  discuss this with her.  Ms. Bacigalupo is strongly encouraged to resume individual psychotherapy to address ongoing symptoms of anxiety and depression. She would benefit from an active and collaborative therapeutic environment, rather than one purely supportive in nature. Recommended treatment modalities include Cognitive Behavioral Therapy (CBT) or Acceptance and Commitment Therapy (ACT).  Ms. Fennel is encouraged to attend to lifestyle factors for brain health (e.g., regular physical exercise, good nutrition habits, regular participation in cognitively-stimulating activities, and general stress management techniques), which are likely to have benefits for both emotional adjustment and cognition. In fact, in addition to promoting good general health, regular exercise incorporating aerobic activities (e.g., brisk walking, jogging, cycling, etc.) has been demonstrated to be a very effective treatment for depression and stress, with similar efficacy rates to both antidepressant medication and psychotherapy. Optimal control of vascular risk factors (including safe cardiovascular exercise and adherence to dietary recommendations) is encouraged. Likewise, continued compliance with her CPAP machine will also be important. Continued participation in activities which provide mental stimulation and social interaction is also recommended.   If interested, there are some activities which have therapeutic value and can be useful in keeping her cognitively stimulated. For suggestions, Ms. Leppla is encouraged to go to the following website:  https://www.[facility].org/get-to-know-barrow/centers-programs/neurorehabilitation-center/neuro-rehab-apps-and-games/ which has options, categorized by level of difficulty. It should be noted that these activities should not be viewed as a substitute for therapy.  When learning new information, she would benefit from information being broken up into small, manageable pieces. She may also find it helpful to articulate the material in her own words and in a context to promote encoding at the onset of a new task. This material may need to be repeated multiple times to promote encoding.  Memory can be improved using internal strategies such as rehearsal, repetition, chunking, mnemonics, association, and imagery. External strategies such as written notes in a consistently used memory journal, visual and nonverbal auditory cues such as a calendar on the refrigerator or appointments with alarm, such as on a cell phone, can also help maximize recall.    To address problems with processing speed, she may wish to consider:   -Ensuring that she is alerted when essential material or instructions are being presented   -Adjusting the speed at which new information is presented   -Allowing for more time in comprehending, processing, and responding in conversation  To address problems with fluctuating attention, she may wish to consider:   -Avoiding external distractions when needing to concentrate   -Limiting exposure to fast paced environments with multiple sensory demands   -Writing down complicated information and using checklists   -Attempting and completing one task at a time (i.e., no multi-tasking)   -Verbalizing aloud each step of a task to maintain focus   -Reducing the amount of information considered at one time  Review of Records:   Ms. Hauck completed a cognitive evaluation on 05/24/2015 with [facility] Odis Luster, Ph.D.). Intellectual and academic testing performed at  that time scored quite low normatively. Memory performances were also normatively low, with perhaps a slight strength in nonverbal relative to verbal abilities. It is worth noting that the instrumentation used throughout this evaluation is uncommon, especially with regard to memory testing. She also did not complete a formalized IQ evaluation despite comments being made regarding her overall intellectual abilities. While I believe that the overall conclusions made (i.e., the presence of significant psychiatric symptoms, namely major depressive disorder, are directly impacting cognitive performances across testing) were very  reasonable at that time, Ms. Myricks did not complete a comprehensive enough battery of test where this would constitute a true neuropsychological evaluation.   She was seen by [facility], M.D.) on 12/05/2020 for an evaluation of headaches. Medications were adjusted. Medical history is remarkable for a Chiari malformation, s/p occipital craniotomy for decompression. She reported persisting memory dysfunction during that appointment as well. Ultimately, Ms. Halladay was referred for a comprehensive neuropsychological evaluation to characterize her cognitive abilities and to assist with diagnostic clarity and treatment planning.   More recently, Ms. Forgey was seen via virtual visit by her psychiatrist Charlcie Cradle, M.D.) on 02/08/2021. She noted a decline in her overall mood relative to the previous month, due to feeling overwhelmed. This has increased stress and symptoms of anxiety. She noted "random passive thoughts" of death but denied any active suicidal ideation, intent, or plan. Being around for her children represented her most significant protective factor. She also reported worsening depressive symptoms, stating that she is "waiting for the next bad thing to happen." Motivation is poor and concentration difficulties were further noted. Crying spells have  been stable but have persisted. Medications were adjusted in response.   Brain MRI on 08/28/2007 revealed a 7 mm protrusion of cerebellar tonsils below foramen magnum, consistent with a Chiari malformation type I with no evidence of syrinx. Brain MRI on 01/02/2011 noted a prior suboccipital craniectomy and C1 laminectomy with normal postoperative CSF flow. Brain MRI on 02/23/2014 was stable.   Past Medical History:  Diagnosis Date  . Abdominal pain 07/24/2014  . Abscess of right hip 05/11/2020  . Anemia 01/02/2020  . Ankle pain, right 04/24/2014  . Breast pain, right 09/08/2014  . Chest pain 02/15/2015  . Chiari malformation type I 01/26/2014   s/p sgy decompression  . Chronic back pain 12/13/2012  . Chronic gastritis 07/18/2015  . Chronic tension-type headache, intractable 04/24/2015  . DDD (degenerative disc disease), lumbar   . Diverticulitis   . Dysmenorrhea   . Dysplastic nevus 10/09/2015   R abdomen - moderate, limited margins free.  . Endometriosis   . Generalized anxiety disorder 05/01/2015   Has previously seen psychiatry. Having some issues with what she feels is related to stress and anxiety  . GERD (gastroesophageal reflux disease)   . Hemorrhoids 12/23/2020  . Hidradenitis   . History of ovarian cyst   . Hot flashes 03/12/2015  . Hypercholesteremia 01/10/2014   Off lipitor. Elevated cholesterol. Labs discussed. Restart lipitor. Follow lipid panel and liver function tests.  . Hyperglycemia 06/24/2019  . Major depressive disorder 05/01/2015  . Migraine headaches 01/10/2014   Has seen neurology. Topamax.  . Mitral valve prolapse   . Obesity   . Obstructive sleep apnea 01/10/2014   nightly CPAP use  . PONV (postoperative nausea and vomiting)   . Primary osteoarthritis of right hip 01/17/2016  . Right hip pain 01/10/2014  . Soft tissue mass 03/03/2021  . 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 TMJ (dislocation of temporomandibular joint)   . Vitamin B12 deficiency    On  injections  . Vitamin D deficiency 01/18/2014   Overview:  Level is 21.6  Formatting of this note might be different from the original. Level is 21.6    Past Surgical History:  Procedure Laterality Date  . ABDOMINAL HYSTERECTOMY  03/27/07   laparoscopic  . BREAST EXCISIONAL BIOPSY Left 2004   fibroadenoma  . CESAREAN SECTION     1999/2004 twins  . chiari  decompression    . COLONOSCOPY WITH PROPOFOL N/A 04/28/2015   Procedure: COLONOSCOPY WITH PROPOFOL;  Surgeon: Manya Silvas, MD;  Location: [facility] ENDOSCOPY;  Service: Endoscopy;  Laterality: N/A;  . COLONOSCOPY WITH PROPOFOL N/A 03/19/2021   Procedure: COLONOSCOPY WITH PROPOFOL;  Surgeon: Lesly Rubenstein, MD;  Location: [facility] ENDOSCOPY;  Service: Endoscopy;  Laterality: N/A;  . CYSTOSCOPY     bladder ulcers  . ESOPHAGOGASTRODUODENOSCOPY N/A 04/28/2015   Procedure: ESOPHAGOGASTRODUODENOSCOPY (EGD);  Surgeon: Manya Silvas, MD;  Location: [facility] ENDOSCOPY;  Service: Endoscopy;  Laterality: N/A;  . ESOPHAGOGASTRODUODENOSCOPY (EGD) WITH PROPOFOL N/A 03/19/2021   Procedure: ESOPHAGOGASTRODUODENOSCOPY (EGD) WITH PROPOFOL;  Surgeon: Lesly Rubenstein, MD;  Location: [facility] ENDOSCOPY;  Service: Endoscopy;  Laterality: N/A;  . HERNIA REPAIR Right 2003   inguinal  . INCISION AND DRAINAGE HIP Right 05/11/2020   Procedure: IRRIGATION AND DEBRIDEMENT HIP;  Surgeon: Hessie Knows, MD;  Location: [facility] ORS;  Service: Orthopedics;  Laterality: Right;  . OVARIAN CYST REMOVAL  2001   right  . TEMPOROMANDIBULAR JOINT SURGERY  2000  . TOTAL HIP ARTHROPLASTY Right 12/28/2019   Procedure: TOTAL HIP ARTHROPLASTY ANTERIOR APPROACH;  Surgeon: Hessie Knows, MD;  Location: [facility] ORS;  Service: Orthopedics;  Laterality: Right;  . TUBAL LIGATION     Bilateral    Current Outpatient Medications:  .  acetaminophen (TYLENOL) 500 MG tablet, Take 500-1,000 mg by mouth 2 (two) times daily as needed for mild pain. , Disp: , Rfl:  .  AIMOVIG 140 MG/ML SOAJ, Inject 140 mg  into the skin every 28 (twenty-eight) days., Disp: , Rfl:  .  Ascorbic Acid (VITAMIN C) 500 MG CHEW, Chew 500 mg by mouth daily. (Patient not taking: Reported on 03/28/2021), Disp: , Rfl:  .  atorvastatin (LIPITOR) 10 MG tablet, Take 1 tablet by mouth once daily, Disp: 30 tablet, Rfl: 0 .  augmented betamethasone dipropionate (DIPROLENE-AF) 0.05 % cream, Apply to hands twice a day as needed. Avoid applying to face, groin, and axilla. Use as directed. (Patient not taking: Reported on 03/28/2021), Disp: 50 g, Rfl: 0 .  busPIRone (BUSPAR) 10 MG tablet, Take 1 tablet (10 mg total) by mouth at bedtime., Disp: 90 tablet, Rfl: 0 .  Crisaborole (EUCRISA) 2 % OINT, Apply to hands twice a day Mon-Fri, Disp: 60 g, Rfl: 2 .  cyanocobalamin (,VITAMIN B-12,) 1000 MCG/ML injection, INJECT ONE ML (CC) INTRAMUSCULARLY ONCE EVERY MONTH (Patient taking differently: Inject 1,000 mcg into the muscle every 30 (thirty) days.), Disp: 10 mL, Rfl: 1 .  diclofenac sodium (VOLTAREN) 1 % GEL, Apply 1 application topically 4 (four) times daily as needed (pain.). , Disp: , Rfl:  .  esomeprazole (NEXIUM) 40 MG capsule, Take 1 capsule by mouth once daily, Disp: 90 capsule, Rfl: 0 .  estradiol (ESTRACE VAGINAL) 0.1 MG/GM vaginal cream, One applicator q hs x 5 nights and then 2x/week. (Patient taking differently: Place 1 Applicatorful vaginally once a week.), Disp: 42.5 g, Rfl: 1 .  fluorouracil (EFUDEX) 5 % cream, Apply topically in the morning and at bedtime. To affected areas at hands, Disp: 40 g, Rfl: 0 .  furosemide (LASIX) 20 MG tablet, Take 20 mg by mouth as needed. (Patient not taking: Reported on 03/28/2021), Disp: , Rfl:  .  gabapentin (NEURONTIN) 400 MG capsule, Take 400 mg by mouth 3 (three) times daily., Disp: , Rfl:  .  ibuprofen (ADVIL) 800 MG tablet, Take 800 mg by mouth 3 (three) times daily as needed (Pain). , Disp: ,  Rfl:  .  lactase (RA DAIRY AID) 3000 units tablet, Take 9,000 Units by mouth 3 (three) times daily as  needed (with dairy consumption). , Disp: , Rfl:  .  methocarbamol (ROBAXIN) 500 MG tablet, Take 500 mg by mouth 4 (four) times daily., Disp: , Rfl:  .  mupirocin ointment (BACTROBAN) 2 %, Apply 1 application topically daily. (Patient not taking: Reported on 03/28/2021), Disp: 22 g, Rfl: 0 .  naloxone (NARCAN) 2 MG/2ML injection, Inject into the vein as needed., Disp: , Rfl:  .  NON FORMULARY, Take 2 capsules by mouth daily. Thrive vitamins with shake and derma patch, Disp: , Rfl:  .  nystatin cream (MYCOSTATIN), APPLY TOPICALLY TWICE DAILY, Disp: 30 g, Rfl: 0 .  Oxycodone HCl 10 MG TABS, Take 10 mg by mouth 5 (five) times daily. , Disp: , Rfl: 0 .  potassium chloride (KLOR-CON) 10 MEQ tablet, Take 1 tablet (10 mEq total) by mouth daily as needed (As needed with Furosemide (Lasix))., Disp: 90 tablet, Rfl: 3 .  rizatriptan (MAXALT) 10 MG tablet, Take 10 mg by mouth as needed for migraine. May repeat in 2 hours if needed, Disp: , Rfl:  .  Semaglutide,0.25 or 0.5MG /DOS, (OZEMPIC, 0.25 OR 0.5 MG/DOSE,) 2 MG/1.5ML SOPN, Inject 0.5 mg into the skin once a week. Inject 0.25 mg weekly for 4 weeks, then increase to 0.5 mg weekly, Disp: 1.5 mL, Rfl: 2 .  sertraline (ZOLOFT) 100 MG tablet, Take 2 tablets (200 mg total) by mouth daily., Disp: 180 tablet, Rfl: 0 .  Simethicone (GAS RELIEF PO), Take 125 mg by mouth 2 (two) times daily as needed for flatulence. Anti gas, Disp: , Rfl:  .  topiramate (TOPAMAX) 25 MG tablet, Take 25 mg by mouth 2 (two) times daily. (Patient not taking: Reported on 03/28/2021), Disp: , Rfl:  .  triamcinolone cream (KENALOG) 0.1 %, APPLY  CREAM EXTERNALLY TWICE DAILY TO AFFECTED AREA ON  FINGERS (Patient not taking: Reported on 03/28/2021), Disp: 30 g, Rfl: 0 .  Ubrogepant (UBRELVY) 100 MG TABS, Take 100 mg by mouth 2 (two) times daily as needed., Disp: , Rfl:   Clinical Interview:   The following information was obtained during a clinical interview with Ms. Salceda prior to cognitive  testing.  Cognitive Symptoms: Decreased short-term memory: Endorsed. She described largely generalized concerns. Examples included trouble recalling details of previous conversations and repeating herself often. She noted that concerns have been present for many years (even prior to her 2016 evaluation) and have seemingly ebbed and flowed over the years. However, she described perhaps worse abilities during the past year or so.  Decreased long-term memory: Endorsed. She described having trouble remembering details surrounding important life events, such as the birth of her children or certain details from her wedding. Decreased attention/concentration: Endorsed. She noted longstanding difficulties with sustained attention and ease of distractibility, dating back to early childhood and academic settings. She reported never being tested for ADHD in the past to her knowledge but did admit wondering if she has had this condition throughout her life. Attentional deficits were said to persist to present day.  Reduced processing speed: Endorsed "a little bit."  Difficulties with executive functions: Generally denied. She noted mild issues with organization at times. She denied prominent difficulties with indecision or impulsivity and denied any overt personality changes.  Difficulties with emotion regulation: Denied. Difficulties with receptive language: Denied. Difficulties with word finding: Endorsed. Decreased visuoperceptual ability: Denied.  Difficulties completing ADLs: Denied.  Additional Medical  History: History of traumatic brain injury/concussion: Denied. However, as stated above, her medical history is remarkable for a Chiari malformation, s/p occipital decompression.  History of stroke: Denied. History of seizure activity: Denied. History of known exposure to toxins: Denied. Symptoms of chronic pain: Endorsed. Medical records suggest diffuse and often debilitation chronic pain. Ms.  Meinhardt noted that pain is especially present in her back and hips bilaterally. Symptoms were said to be debilitating and regularly limit her functioning.  Experience of frequent headaches/migraines: Endorsed. She reported experiencing migraine headaches a few times per week. Associated symptoms were said to include light sensitivity, nausea, and feeling the need to lay down. She did report that recent medication changes were helpful in "taking the edge off."  Frequent instances of dizziness/vertigo: Endorsed. The cause for this was unknown as it seemingly happens at random and not only when she stands quickly or changes position.   Sensory changes: She wears glasses with positive effect. She noted ongoing tinnitus which can impact her hearing. Other sensory changes (i.e., taste or smell) were denied.  Balance/coordination difficulties: Denied. She did acknowledge falling twice in the somewhat recent past. However, these events were accidental, caused by her slipping and falling or tripping over something in her environment.  Other motor difficulties: Denied.  Sleep History: Estimated hours obtained each night: 5-6 hours.  Difficulties falling asleep: Endorsed. Difficulties were attributed to the presence of racing and intrusive thoughts.  Difficulties staying asleep: Endorsed. She described her sleep as broken, noting that she will commonly awake throughout the night for unknown reasons.  Feels rested and refreshed upon awakening: "Sometimes."  History of snoring: Endorsed. History of waking up gasping for air: Endorsed. Witnessed breath cessation while asleep: Endorsed. She acknowledged a history of obstructive sleep apnea an utilizes her CPAP machine nightly. However, she did note times where she will remove her mask if she is woken up during the middle of the night.   History of vivid dreaming: Denied. Excessive movement while asleep: Denied. Instances of acting out her dreams:  Denied.  Psychiatric/Behavioral Health History: Depression: Endorsed. She acknowledged a longstanding history of depressive symptoms and has been diagnosed with major depressive disorder in the past. She acknowledged passive suicidal ideation as recent as a few years prior but denied any active ideation, intent, or plan. Her family serves as her primary protective factor. Acutely, she described her mood as "okay." She noted that she recently got a new dog which has provided a boost. She recently had mood-related medications adjusted and stated it was too soon to tell of their effectiveness. She previously worked with a Transport planner with some success. Anxiety: Endorsed. She reported a longstanding history of anxious thoughts and has been diagnosed with generalized anxiety disorder in the past.  Mania: Denied. Trauma History: Denied. Visual/auditory hallucinations: Denied. Delusional thoughts: Denied.  Tobacco: Denied. Alcohol: She denied current alcohol consumption as well as a history of problematic alcohol abuse or dependence.  Recreational drugs: Denied. Caffeine: 1-2 cups of coffee, as well as an occasional soda.   Family History: Problem Relation Age of Onset  . Hyperlipidemia Mother   . Anxiety disorder Mother   . Atrial fibrillation Mother   . Hypertension Mother   . Memory loss Mother   . Anxiety disorder Father   . Stroke Father   . Hyperlipidemia Father   . Hypertension Father   . Congestive Heart Failure Father   . Depression Father   . Hyperlipidemia Brother   . Colon cancer Paternal Aunt   .  Dementia Paternal Aunt   . Dementia Maternal Grandmother   . Dementia Paternal Grandmother   . ADD / ADHD Daughter   . Breast cancer Neg Hx    This information was confirmed by Ms. Deltoro.  Academic/Vocational History: Highest level of educational attainment: 13 years. She graduated from high school and reported completing likely one year of college. She described herself as a  C/D student in academic settings. When asked about relative weaknesses, she commented "all of it." She followed this by specifically identifying math.  History of developmental delay: Denied. History of grade repetition: Denied. Enrollment in special education courses: Denied. History of LD/ADHD: Denied.  Employment: She currently receives disability benefits due to longstanding medical conditions. She stopped worked in 2011/2012 after a sustaining a work-related injury. She previously worked as an Producer, television/film/video.   Evaluation Results:   Behavioral Observations: Ms. Feely was unaccompanied, arrived to her appointment on time, and was appropriately dressed and groomed. She appeared alert and oriented. Observed gait and station were within normal limits. Gross motor functioning appeared intact upon informal observation and no abnormal movements (e.g., tremors) were noted. Her affect was relaxed and positive. Spontaneous speech was fluent. Mild word finding difficulties were observed during interview. Thought processes were coherent, organized, and normal in content. Insight into her cognitive difficulties appeared adequate. During testing, sustained attention was appropriate. Task engagement was adequate and she persisted when challenged. Overall, Ms. Clover was cooperative with the clinical interview and subsequent testing procedures.   Adequacy of Effort: The validity of neuropsychological testing is limited by the extent to which the individual being tested may be assumed to have exerted adequate effort during testing. Scores across stand-alone and embedded performance validity measures were variable but almost exclusively below expectation. As such, the results of the current evaluation are not believed to be a valid representation of Ms. Banfill's current cognitive functioning, should not be interpreted at face value, and very likely underestimate true cognitive abilities to an unknown  degree.  Test Results: Ms. Lumsden was largely oriented at the time of the current evaluation. Points were lost for her being unaware of the current date.  Intellectual abilities based upon educational and vocational attainment were estimated to be in the below average to average range. Her full scale IQ score of 76 fell in the well below average range (5th percentile) relative to age-matched peers.    Processing speed was variable, ranging from the exceptionally low to average normative ranges. Basic attention was average. More complex attention (e.g., working memory) was below average to average. Executive functioning was exceptionally low to well below average.  While not directly assessed, receptive language abilities were believed to be intact. Ms. Mccrae did not exhibit any difficulties comprehending task instructions and answered all questions asked of her appropriately. Assessed expressive language (e.g., verbal fluency and confrontation naming) was variable, ranging from the exceptionally low to below average normative ranges.     Assessed visuospatial/visuoconstructional abilities were largely below average.    Learning (i.e., encoding) of novel verbal and visual information was variable, ranging from the exceptionally low to below average normative ranges. Spontaneous delayed recall (i.e., retrieval) of previously learned information was average on a visual task but exceptionally low to well below average across verbal tasks. Retention rates were 76% across a story learning task, 125% across a list learning task, and 86% across a shape learning task. Performance across recognition tasks was below average to average, suggesting evidence for information consolidation.  Fine motor  coordination/speed was exceptionally low bilaterally.    Results of emotional screening instruments suggested that recent symptoms of generalized anxiety were in the moderate range, while symptoms of  depression were within the severe range. Across a more comprehensive personality questionnaire, she elevated clinical subscales surrounding somatic complaints, anxiety, anxiety-related disorders, and depression. A screening instrument assessing recent sleep quality suggested the presence of moderate sleep dysfunction.  Tables of Scores:   Note: This summary of test scores accompanies the interpretive report and should not be considered in isolation without reference to the appropriate sections in the text. Descriptors are based on appropriate normative data and may be adjusted based on clinical judgment. The terms "impaired" and "within normal limits (WNL)" are used when a more specific level of functioning cannot be determined.       Validity Testing:   DESCRIPTOR       Test of Memory Malingering (TOMM): --- --- Below Expectation    Trial 1 --- --- Below Expectation    Trial 2 --- --- Below Expectation    Retention --- --- Below Expectation  ACS Word Choice: --- --- Below Expectation  WAIS-IV Reliable Digit Span: --- --- Within Expectation  D-KEFS Color Word Effort Index: --- --- Below Expectation       Orientation:      Raw Score Percentile   NAB Orientation, Form 1 27/29 --- ---       Cognitive Screening:           Raw Score Percentile   SLUMS: 22/30 --- ---       Intellectual Functioning:          Wechsler Adult Intelligence Scale (WAIS-IV): Standard Score/ Scaled Score Percentile   Full Scale IQ  50 5 Well Below Average  GAI 75 5 Well Below Average  Verbal Comprehension Index: 76 5 Well Below Average    Similarities  4 2 Well Below Average    Vocabulary 8 25 Average    Information  5 5 Well Below Average  Perceptual Reasoning Index:  79 8 Well Below Average    Block Design  7 16 Below Average    Matrix Reasoning  5 5 Well Below Average    Visual Puzzles 7 16 Below Average  Working Memory Index: 83 13 Below Average    Digit Span 7 16 Below Average    Arithmetic  7 16  Below Average  Processing Speed Index: 86 18 Below Average    Symbol Search  6 9 Below Average    Coding 9 37 Average       Memory:          NAB Memory Module, Form 1: T Score Percentile   List Learning       Total Trials 1-3 22/36 (41) 18 Below Average    List B 4/12 (43) 25 Average    Short Delay Free Recall 4/12 (27) 1 Exceptionally Low    Long Delay Free Recall 5/12 (34) 5 Well Below Average    Retention Percentage 125 (60) 84 Above Average    Recognition Discriminability 5 (40) 16 Below Average  Shape Learning       Total Trials 1-3 15/27 (42) 21 Below Average    Delayed Recall 6/9 (46) 34 Average    Retention Percentage 86 (44) 27 Average    Recognition Discriminability 7 (45) 31 Average  Story Learning       Immediate Recall 28/80 (22) <1 Exceptionally Low    Delayed Recall 13/40 (27)  1 Exceptionally Low    Retention Percentage 76 (31) 3 Well Below Average  Daily Living Memory       Immediate Recall 41/51 (42) 21 Below Average    Delayed Recall 10/17 (24) <1 Exceptionally Low    Retention Percentage 63 (23) <1 Exceptionally Low    Recognition Hits 9/10 (47) 38 Average       Attention/Executive Function:          Trail Making Test (TMT): Raw Score (T Score) Percentile     Part A 30 secs.,  0 errors (44) 27 Average    Part B 92 secs.,  1 error (36) 8 Well Below Average         Scaled Score Percentile   WAIS-IV Digit Span: 7 16 Below Average    Forward 9 37 Average    Backward 8 25 Average    Sequencing 6 9 Below Average       D-KEFS Color-Word Interference Test: Raw Score (Scaled Score) Percentile     Color Naming 49 secs. (1) <1 Exceptionally Low    Word Reading 31 secs. (5) 5 Well Below Average    Inhibition 83 secs. (4) 2 Well Below Average      Total Errors 4 errors (6) 9 Below Average    Inhibition/Switching 106 secs. (2) <1 Exceptionally Low      Total Errors 8 errors (4) 2 Well Below Average       D-KEFS Verbal Fluency Test: Raw Score (Scaled Score)  Percentile     Letter Total Correct 27 (6) 9 Below Average    Category Total Correct 30 (6) 9 Below Average    Category Switching Total Correct 6 (1) <1 Exceptionally Low    Category Switching Accuracy 5 (3) 1 Exceptionally Low      Total Set Loss Errors 2 (10) 50 Average      Total Repetition Errors 3 (9) 37 Average       Language:          Verbal Fluency Test: Raw Score (T Score) Percentile     Phonemic Fluency (FAS) 27 (35) 7 Well Below Average    Animal Fluency 14 (32) 4 Well Below Average        NAB Language Module, Form 1: T Score Percentile     Naming 24/31 (19) <1 Exceptionally Low       Visuospatial/Visuoconstruction:      Raw Score Percentile   Clock Drawing: 10/10 --- Within Normal Limits       NAB Spatial Module, Form 1: T Score Percentile     Figure Drawing Copy 42 21 Below Average       Sensory-Motor:          [place] Grooved Pegboard Test: Raw Score Percentile     Dominant Hand 147 secs.,  5 drops  <1 Exceptionally Low    Non-Dominant Hand 131 secs.,  3 drops  <1 Exceptionally Low       Mood and Personality:      Raw Score Percentile   Beck Depression Inventory - II: 30 --- Severe  PROMIS Anxiety Questionnaire: 25 --- Moderate       Personality Assessment Inventory: T Score Percentile     Inconsistency 58 --- Within Normal Limits    Infrequency 71 --- Moderate    Negative Impression 62 --- Within Normal Limits    Positive Impression 34 --- Within Normal Limits    Somatic Complaints 73 --- Elevated    Anxiety  75 --- Elevated    Anxiety-Related Disorders 71 --- Elevated    Depression 94 --- Elevated    Mania 45 --- Within Normal Limits    Paranoia 48 --- Within Normal Limits    Schizophrenia 67 --- Within Normal Limits    Borderline Features 69 --- Within Normal Limits    Antisocial Features 51 --- Within Normal Limits    Alcohol Problems 57 --- Within Normal Limits    Drug Problems 66 --- Within Normal Limits    Aggression 64 --- Within Normal  Limits    Suicidal Ideation 54 --- Within Normal Limits    Stress 57 --- Within Normal Limits    Non Support 48 --- Within Normal Limits    Treatment Rejection 40 --- Within Normal Limits    Dominance 42 --- Within Normal Limits    Warmth 40 --- Within Normal Limits       Additional Questionnaires:      Raw Score Percentile   PROMIS Sleep Disturbance Questionnaire: 33 --- Moderate   Informed Consent and Coding/Compliance:   The current evaluation represents a clinical evaluation for the purposes previously outlined by the referral source and is in no way reflective of a forensic evaluation.   Ms. Hegeman was provided with a verbal description of the nature and purpose of the present neuropsychological evaluation. Also reviewed were the foreseeable risks and/or discomforts and benefits of the procedure, limits of confidentiality, and mandatory reporting requirements of this provider. The patient was given the opportunity to ask questions and receive answers about the evaluation. Oral consent to participate was provided by the patient.   This evaluation was conducted by Christia Reading, Ph.D., licensed clinical neuropsychologist. Ms. Kendzierski completed a clinical interview with Dr. Melvyn Novas, billed as one unit [phone], and 190 minutes of cognitive testing and scoring, billed as one unit [phone] and five additional units 96139. Psychometrist Milana Kidney, B.S., assisted Dr. Melvyn Novas with test administration and scoring procedures. As a separate and discrete service, Dr. Melvyn Novas spent a total of 160 minutes in interpretation and report writing billed as one unit [phone] and two units 96133.

## 2021-04-17 NOTE — Progress Notes (Signed)
   Psychometrician Note   Cognitive testing was administered to Kathleen Everett by Milana Kidney, B.S. (psychometrist) under the supervision of Dr. Christia Reading, Ph.D., licensed psychologist on 04/17/21. Kathleen Everett did not appear overtly distressed by the testing session per behavioral observation or responses across self-report questionnaires. Rest breaks were offered.    The battery of tests administered was selected by Dr. Christia Reading, Ph.D. with consideration to Kathleen Everett current level of functioning, the nature of her symptoms, emotional and behavioral responses during interview, level of literacy, observed level of motivation/effort, and the nature of the referral question. This battery was communicated to the psychometrist. Communication between Dr. Christia Reading, Ph.D. and the psychometrist was ongoing throughout the evaluation and Dr. Christia Reading, Ph.D. was immediately accessible at all times. Dr. Christia Reading, Ph.D. provided supervision to the psychometrist on the date of this service to the extent necessary to assure the quality of all services provided.    Kathleen Everett will return within approximately 1-2 weeks for an interactive feedback session with Dr. Melvyn Novas at which time her test performances, clinical impressions, and treatment recommendations will be reviewed in detail. Kathleen Everett understands she can contact our office should she require our assistance before this time.  A total of 190 minutes of billable time were spent face-to-face with Kathleen Everett by the psychometrist. This includes both test administration and scoring time. Billing for these services is reflected in the clinical report generated by Dr. Christia Reading, Ph.D.  This note reflects time spent with the psychometrician and does not include test scores or any clinical interpretations made by Dr. Melvyn Novas. The full report will follow in a separate note.

## 2021-04-23 ENCOUNTER — Other Ambulatory Visit: Payer: Self-pay | Admitting: Internal Medicine

## 2021-04-23 DIAGNOSIS — Z1231 Encounter for screening mammogram for malignant neoplasm of breast: Secondary | ICD-10-CM

## 2021-04-24 ENCOUNTER — Ambulatory Visit
Admission: RE | Admit: 2021-04-24 | Discharge: 2021-04-24 | Disposition: A | Discharge status: Home / Self Care | Payer: Medicare HMO | Source: Ambulatory Visit | Attending: Internal Medicine | Admitting: Internal Medicine

## 2021-04-24 ENCOUNTER — Other Ambulatory Visit: Payer: Self-pay

## 2021-04-24 DIAGNOSIS — Z1231 Encounter for screening mammogram for malignant neoplasm of breast: Secondary | ICD-10-CM | POA: Insufficient documentation

## 2021-04-26 ENCOUNTER — Encounter: Payer: Self-pay | Admitting: Internal Medicine

## 2021-04-30 ENCOUNTER — Other Ambulatory Visit: Payer: Self-pay

## 2021-04-30 ENCOUNTER — Ambulatory Visit (INDEPENDENT_AMBULATORY_CARE_PROVIDER_SITE_OTHER): Payer: Medicare HMO | Admitting: Psychology

## 2021-04-30 ENCOUNTER — Ambulatory Visit
Admission: RE | Admit: 2021-04-30 | Discharge: 2021-04-30 | Disposition: A | Discharge status: Home / Self Care | Payer: Medicare HMO | Source: Ambulatory Visit | Attending: Orthopedic Surgery | Admitting: Orthopedic Surgery

## 2021-04-30 DIAGNOSIS — F332 Major depressive disorder, recurrent severe without psychotic features: Secondary | ICD-10-CM | POA: Diagnosis not present

## 2021-04-30 DIAGNOSIS — R41840 Attention and concentration deficit: Secondary | ICD-10-CM

## 2021-04-30 DIAGNOSIS — M79651 Pain in right thigh: Secondary | ICD-10-CM | POA: Diagnosis not present

## 2021-04-30 DIAGNOSIS — F411 Generalized anxiety disorder: Secondary | ICD-10-CM | POA: Diagnosis not present

## 2021-04-30 DIAGNOSIS — M7989 Other specified soft tissue disorders: Secondary | ICD-10-CM | POA: Diagnosis not present

## 2021-04-30 DIAGNOSIS — Z96641 Presence of right artificial hip joint: Secondary | ICD-10-CM | POA: Diagnosis not present

## 2021-04-30 DIAGNOSIS — R200 Anesthesia of skin: Secondary | ICD-10-CM | POA: Diagnosis not present

## 2021-04-30 MED ORDER — GADOBENATE DIMEGLUMINE 529 MG/ML IV SOLN
20.0000 mL | Freq: Once | INTRAVENOUS | Status: AC | PRN
  Administered 2021-04-30: 20 mL via INTRAVENOUS

## 2021-04-30 NOTE — Progress Notes (Signed)
   Neuropsychology Feedback Session Kathleen Everett. [facility]  Reason for Referral:   Kathleen Everett is a 50 y.o. right-handed Caucasian female referred by Jennings Books, M.D., to characterize her current cognitive functioning and assist with diagnostic clarity and treatment planning in the context of subjective cognitive decline and several medical and psychiatric comorbidities.  Feedback:   Kathleen Everett completed a comprehensive neuropsychological evaluation on 04/17/2021. Please refer to that encounter for the full report and recommendations. Briefly, broad patterns across cognitive testing reveal noteworthy performance variability across several domains (i.e., processing speed, expressive language) with more consistent areas of weakness surrounding executive functioning, fine motor coordination/speed, and both encoding (i.e., learning) and retrieval aspects of memory. Comparisons to her prior evaluation in 2016 are unable to be performed due current validity concerns as well as her prior evaluation utilizing quite uncommon and inadequate (in my professional opinion) instrumentation. Given her age and appropriate retention scores across memory tasks, I see no reason to have concerns surrounding an underlying neurocognitive disorder at the present time.  Diagnoses: Major depressive disorder, recurrent, severe; Generalized anxiety disorder. Rule-out: Attention deficit hyperactivity disorder (ADHD).  Kathleen Everett was unaccompanied during the current feedback session. Content of the current session focused on the results of her neuropsychological evaluation. Kathleen Everett was given the opportunity to ask questions and her questions were answered. We discussed why ADHD was a rule-out rather than officially diagnosed condition. We also discussed stimulant medications and I encouraged her to speak her her prescribing physician(s) as a trial of this type  of medication seems reasonable. She was encouraged to reach out should additional questions arise. A copy of her report was provided at the conclusion of the visit.      25 minutes were spent conducting the current feedback session with Kathleen Everett, billed as one unit [phone].

## 2021-05-04 ENCOUNTER — Ambulatory Visit (INDEPENDENT_AMBULATORY_CARE_PROVIDER_SITE_OTHER): Payer: Medicare HMO | Admitting: Pharmacist

## 2021-05-04 VITALS — Wt 288.8 lb

## 2021-05-04 DIAGNOSIS — G43809 Other migraine, not intractable, without status migrainosus: Secondary | ICD-10-CM

## 2021-05-04 DIAGNOSIS — E7800 Pure hypercholesterolemia, unspecified: Secondary | ICD-10-CM

## 2021-05-04 DIAGNOSIS — F320 Major depressive disorder, single episode, mild: Secondary | ICD-10-CM

## 2021-05-04 DIAGNOSIS — R739 Hyperglycemia, unspecified: Secondary | ICD-10-CM

## 2021-05-04 DIAGNOSIS — F32A Depression, unspecified: Secondary | ICD-10-CM

## 2021-05-04 DIAGNOSIS — M545 Low back pain, unspecified: Secondary | ICD-10-CM

## 2021-05-04 DIAGNOSIS — Z6841 Body Mass Index (BMI) 40.0 and over, adult: Secondary | ICD-10-CM

## 2021-05-04 MED ORDER — OZEMPIC (0.25 OR 0.5 MG/DOSE) 2 MG/1.5ML ~~LOC~~ SOPN: 0.5000 mg | PEN_INJECTOR | SUBCUTANEOUS | 2 refills | Status: DC

## 2021-05-04 NOTE — Patient Instructions (Signed)
Visit Information  PATIENT GOALS:  Goals Addressed               This Visit's Progress     Patient Stated     Medication Monitoring (pt-stated)        Patient Goals/Self-Care Activities Over the next 90 days, patient will:  - take medications as prescribed collaborate with provider on medication access solutions engage in dietary modifications by moderating portion sizes          Patient verbalizes understanding of instructions provided today and agrees to view in [place].   Plan: Telephone follow up appointment with care management team member scheduled for:  4 weeks  Catie Darnelle Maffucci, PharmD, [place], [place] Clinical Pharmacist [employer] [facility] [employer] [phone]

## 2021-05-04 NOTE — Chronic Care Management (AMB) (Signed)
Chronic Care Management Pharmacy Note  05/04/2021 Name:  Kathleen Everett MRN:  [id number] DOB:  04/15/1971  Subjective: Kathleen Everett is an 50 y.o. year old female who is a primary patient of [name], MD.  The CCM team was consulted for assistance with disease management and care coordination needs.    Engaged with patient by telephone for follow up visit in response to provider referral for pharmacy case management and/or care coordination services.   Consent to Services:  The patient was given information about Chronic Care Management services, agreed to services, and gave verbal consent prior to initiation of services.  Please see initial visit note for detailed documentation.   Patient Care Team: [name], MD as PCP - General (Internal Medicine) [name], MD as Referring Physician (Neurology) [name], MD as Consulting Physician (Cardiology) [name], RPH-CPP (Pharmacist)  Recent office visits: None since our last appointment  Recent consult visits: 5/12 - [facility] - thigh mass, imaging did not show concern 5/17 - pain mgmt [facility] - continue current regimen 5/24 - neurology Manuella Ghazi- start zonisamide QPM 5/31 - neuropsych eval per Dr Manuella Ghazi - encouraged medication + CBT therapy, regular physical activity,  6/13 - [facility] - recommended to discuss stimulant w/ psych moving forward  Hospital visits: None in previous 6 months  Objective:  Lab Results  Component Value Date   CREATININE 0.74 03/01/2021   CREATININE 0.95 06/26/2020   CREATININE 0.90 05/11/2020    Lab Results  Component Value Date   HGBA1C 5.7 03/01/2021   Last diabetic Eye exam: No results found for: HMDIABEYEEXA  Last diabetic Foot exam: No results found for: HMDIABFOOTEX      Component Value Date/Time   CHOL 230 (H) 03/01/2021 1209   CHOL 235 (H) 03/19/2012 0445   TRIG 170.0 (H) 03/01/2021 1209   TRIG 258 (H) 03/19/2012 0445   HDL 41.90  03/01/2021 1209   HDL 35 (L) 03/19/2012 0445   CHOLHDL 5 03/01/2021 1209   VLDL 34.0 03/01/2021 1209   VLDL 52 (H) 03/19/2012 0445   LDLCALC 154 (H) 03/01/2021 1209   LDLCALC 148 (H) 03/19/2012 0445   LDLDIRECT 181.0 06/26/2020 0855    Hepatic Function Latest Ref Rng & Units 03/01/2021 06/26/2020 04/06/2020  Total Protein 6.0 - 8.3 g/dL 6.8 6.9 7.2  Albumin 3.5 - 5.2 g/dL 4.2 4.3 4.5  AST 0 - 37 U/L 23 22 19   ALT 0 - 35 U/L 24 28 23   Alk Phosphatase 39 - 117 U/L 113 110 118(H)  Total Bilirubin 0.2 - 1.2 mg/dL 0.4 0.3 0.4  Bilirubin, Direct 0.0 - 0.3 mg/dL 0.1 0.1 0.1    Lab Results  Component Value Date/Time   TSH 3.44 06/26/2020 08:55 AM   TSH 3.51 06/24/2019 09:24 AM    CBC Latest Ref Rng & Units 06/26/2020 05/11/2020 02/22/2020  WBC 4.0 - 10.5 K/uL 5.7 8.7 6.5  Hemoglobin 12.0 - 15.0 g/dL 12.7 11.9(L) 13.0  Hematocrit 36.0 - 46.0 % 38.5 36.4 38.9  Platelets 150.0 - 400.0 K/uL 248.0 276 245.0    Lab Results  Component Value Date/Time   VD25OH 23.27 (L) 06/18/2018 09:18 AM   VD25OH 28.42 (L) 08/01/2016 08:17 AM    Clinical ASCVD: No  The 10-year ASCVD risk score Mikey Bussing DC Jr., et al., 2013) is: 2.4%   Values used to calculate the score:     Age: 27 years     Sex: Female  Is Non-Hispanic African American: No     Diabetic: No     Tobacco smoker: No     Systolic Blood Pressure: 169 mmHg     Is BP treated: No     HDL Cholesterol: 41.9 mg/dL     Total Cholesterol: 230 mg/dL     Social History   Tobacco Use  Smoking Status Never  Smokeless Tobacco Never   BP Readings from Last 3 Encounters:  03/19/21 122/71  03/17/21 127/74  03/01/21 128/80   Pulse Readings from Last 3 Encounters:  03/19/21 63  03/17/21 71  03/01/21 88   Wt Readings from Last 3 Encounters:  05/04/21 288 lb 12.8 oz (131 kg)  03/19/21 292 lb (132.5 kg)  03/17/21 299 lb 13.2 oz (136 kg)    Assessment: Review of patient past medical history, allergies, medications, health status, including  review of consultants reports, laboratory and other test data, was performed as part of comprehensive evaluation and provision of chronic care management services.   SDOH:  (Social Determinants of Health) assessments and interventions performed:  SDOH Interventions    Flowsheet Row Most Recent Value  SDOH Interventions   Financial Strain Interventions Intervention Not Indicated       CCM Care Plan  Allergies  Allergen Reactions   Vancomycin Hives   Ceftin [Cefuroxime Axetil] Other (See Comments)    Upset stomach   Celexa [Citalopram Hydrobromide] Diarrhea    GI upset     Clindamycin/Lincomycin Rash   Penicillins Rash    Did it involve swelling of the face/tongue/throat, SOB, or low BP? No Did it involve sudden or severe rash/hives, skin peeling, or any reaction on the inside of your mouth or nose? Unknown Did you need to seek medical attention at a hospital or doctor's office? No When did it last happen? childhood reaction If all above answers are "NO", may proceed with cephalosporin use.    Sulfa Antibiotics Rash    Medications Reviewed Today     Reviewed by Charlcie Cradle, MD (Psychiatrist) on 03/22/21 at 1123  Med List Status: <None>   Medication Order Taking? Sig Documenting Provider Last Dose Status Informant  acetaminophen (TYLENOL) 500 MG tablet [id number] Yes Take 500-1,000 mg by mouth 2 (two) times daily as needed for mild pain.  [provider] Taking Active Self  AIMOVIG 140 MG/ML Darden Palmer [id number] Yes  [provider] Taking Active   Ascorbic Acid (VITAMIN C) 500 MG CHEW [id number] Yes Chew 500 mg by mouth daily. [provider] Taking Active Self           Med Note Gentry Roch   Wed May 10, 2020  9:01 AM) Have not had in a couple of weeks  atorvastatin (LIPITOR) 10 MG tablet [id number] Yes Take 1 tablet by mouth once daily [name], MD Taking Active   augmented betamethasone dipropionate (DIPROLENE-AF) 0.05 % cream [id number]  Yes Apply to hands twice a day as needed. Avoid applying to face, groin, and axilla. Use as directed. Moye, [place], MD Taking Active   Crisaborole [facility]) 2 % Kerby Moors [id number] Yes Apply to hands twice a day Mon-Fri [place], [place], MD Taking Active   cyanocobalamin (,VITAMIN B-12,) 1000 MCG/ML injection [id number] Yes INJECT ONE ML (CC) INTRAMUSCULARLY ONCE EVERY MONTH  Patient taking differently: Inject 1,000 mcg into the muscle every 30 (thirty) days.   [name], MD Taking Active            Med [facility], BRANDY L   Mon  Mar 13, 2020  8:32 AM)    diazepam (VALIUM) 5 MG tablet [id number] No Take 5 mg by mouth every 6 (six) hours as needed for anxiety.  Patient not taking: Reported on 03/22/2021   [provider] Not Taking Active   diclofenac sodium (VOLTAREN) 1 % GEL [id number] Yes Apply 1 application topically 4 (four) times daily as needed (pain.).  [provider] Taking Active Self  esomeprazole (NEXIUM) 40 MG capsule [id number] Yes Take 1 capsule by mouth once daily [name], MD Taking Active   estradiol (ESTRACE VAGINAL) 0.1 MG/GM vaginal cream [id number] Yes One applicator q hs x 5 nights and then 2x/week.  Patient taking differently: Place 1 Applicatorful vaginally 2 (two) times a week.   [name], MD Taking Active   fluorouracil (EFUDEX) 5 % cream [id number] Yes Apply topically in the morning and at bedtime. To affected areas at [place], MD Taking Active   furosemide (LASIX) 20 MG tablet [id number] No Take 1 tablet (20 mg total) by mouth daily as needed (As needed for leg swelling).  Patient not taking: Reported on 03/22/2021   [name], MD Not Taking Active Self  gabapentin (NEURONTIN) 400 MG capsule [id number] Yes Take 400 mg by mouth 3 (three) times daily. [provider] Taking Active Self  ibuprofen (ADVIL) 800 MG tablet [id number] Yes Take 800 mg by mouth 3 (three) times daily as needed (Pain).  [provider] Taking Active Self  lactase (RA DAIRY AID) 3000 units tablet [id number] Yes Take 9,000 Units by mouth 3 (three) times daily as needed (with dairy consumption).  [provider] Taking Active Self  methocarbamol (ROBAXIN) 500 MG tablet [id number] Yes Take 500 mg by mouth 4 (four) times daily. [provider] Taking Active   mupirocin ointment (BACTROBAN) 2 % [id number] Yes Apply 1 application topically daily. Moye, [place], MD Taking Active   NARCAN 4 MG/0.1ML LIQD nasal spray kit [id number] No Place 0.4 mg into the nose once.  Patient not taking: Reported on 03/22/2021   [provider] Not Taking Active   NON FORMULARY [id number] Yes Take 2 capsules by mouth daily. Thrive vitamins with shake and derma patch [provider] Taking Active Self  nystatin cream (MYCOSTATIN) [id number] Yes APPLY TOPICALLY TWICE DAILY Crecencio Mc, MD Taking Active Self  Oxycodone HCl 10 MG TABS [id number] Yes Take 10 mg by mouth 5 (five) times daily.  [provider] Taking Active Self           Med Note Kenton Kingfisher, Kathleen Haddock Nov 30, 2019  8:20 AM)    potassium chloride (KLOR-CON) 10 MEQ tablet [id number]  Take 1 tablet (10 mEq total) by mouth daily as needed (As needed with Furosemide (Lasix)). [name], MD  Expired 06/11/20 2359 Self  rizatriptan (MAXALT) 10 MG tablet [id number] Yes Take 10 mg by mouth as needed for migraine. May repeat in 2 hours if needed [provider] Taking Active Self           Med Note Gentry Roch   Wed May 10, 2020  9:00 AM)    sertraline (ZOLOFT) 100 MG tablet [id number]  Take 1.5 tablets (150 mg total) by mouth daily for 8 days, THEN 2 tablets (200 mg total) daily. Charlcie Cradle, MD  Active   Simethicone (GAS RELIEF PO) [id number] Yes Take 125 mg by mouth 2 (two) times daily as needed for flatulence. Anti gas [provider] Taking Active Self  topiramate (TOPAMAX) 25 MG tablet [id number] Yes Take 25  mg by mouth 2 (two) times daily. [provider] Taking Active   triamcinolone cream (KENALOG) 0.1 % [id number] Yes APPLY  CREAM EXTERNALLY TWICE DAILY TO AFFECTED AREA ON  Cathleen Corti, MD Taking Active             Patient Active Problem List   Diagnosis Date Noted   Vitamin B12 deficiency    Vaginal irritation 03/03/2021   Soft tissue mass 03/03/2021   Hemorrhoids 12/23/2020   DDD (degenerative disc disease), lumbar 10/03/2020   Abscess of right hip 05/11/2020   Anemia 01/02/2020   Status post total hip replacement, 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   Hyperglycemia 06/24/2019   Vaginal bleeding 09/30/2016   Primary osteoarthritis of right hip 01/17/2016   Chronic gastritis 07/18/2015   Major depressive disorder 05/01/2015   Generalized anxiety disorder 05/01/2015   Chronic tension-type headache, intractable 04/24/2015   Dysmenorrhea 04/24/2015   Endometriosis 04/24/2015   Hidradenitis 04/24/2015   Hot flashes 03/12/2015   Chest pain 02/15/2015   BMI 40.0-44.9, adult ([place]) 10/16/2014   Breast pain, right 09/08/2014   Abdominal pain 07/24/2014   GERD (gastroesophageal reflux disease) 04/24/2014   Ankle pain, right 04/24/2014   Chiari malformation type I 01/26/2014   Vitamin D deficiency 01/18/2014   Diverticulitis 01/10/2014   Hypercholesteremia 01/10/2014   Right hip pain 01/10/2014   Migraine headaches 01/10/2014   Obstructive sleep apnea 01/10/2014   Chronic back pain 12/13/2012   Mitral valve prolapse 12/13/2012     There is no immunization history on file for this patient.  Conditions to be addressed/monitored: HLD, Anxiety, Depression, and chronic pain, migraines, obesity  Care Plan : Medication Management  Updates made by [name], RPH-CPP since 05/04/2021 12:00 AM     Problem: Obesity, Chronic Pain, Migraines, Depression      Long-Range Goal: Disease Progression Prevention   Start Date: 03/28/2021  This Visit's Progress: On track   Recent Progress: On track  Priority: High  Note:   Current Barriers:  Unable to achieve control of weight  Complex patient with multiple comorbidities at a high risk for exacerbation and hospitalization  Pharmacist Clinical Goal(s):  Over the next 90 days, patient will achieve improvement of weight through collaboration with PharmD and provider.   Interventions: 1:1 collaboration with [name], MD regarding development and update of comprehensive plan of care as evidenced by provider attestation and co-signature Inter-disciplinary care team collaboration (see longitudinal plan of care) Comprehensive medication review performed; medication list updated in electronic medical record  SDOH: No medication cost concerns, Medicare + Medicaid   Hyperglycemia, Obesity: Unable to achieve weight loss through diet and exercise alone; current regimen: Ozempic 0.5 mg x 2 weeks (2nd dose today) Does note decreased appetite. Denies significant nausea, vomiting, worsened constipation, diarrhea, stomach pain.  Baseline weight: 292 lbs; most recent home weight: 288 lbs; total weight loss thus far 4 lbs (1.4% loss from baseline) Hx bariatric clinic, but decided not to proceed with surgery - benefited from the nutrition class, but husband did not want her to pursue surgery Hx bupropion, ineffective for mental health/weight management Hx topiramate, intolerable side effects of GI upset, CNS depression Hx phentermine years ago  Current meal patterns: focusing on reduced carbohydrate choices when she does eat Current exercise: limited by chronic pain;  Continue current dose of Ozempic 0.5 mg weekly for at least 4 weeks. Scheduled f/u call in ~ 4 weeks. Will discuss potential to  increase to 1 mg weekly at that time Reminded to contact our office if development of severe GI symptoms.   Hyperlipidemia: Controlled per last lipid panel; current treatment: atorvastatin 10 mg daily  Continue current  regimen at this time  Depression/Anxiety Uncontrolled; current treatment: sertraline 200 mg daily, buspirone 10 mg QPM just added; follows w/ Dr. Doyne Keel Per documentation, hx: citalopram, escitalopram, fluoxetine, venlafaxine, duloxetine, hydroxyzine, lithium, quetiapine, hydroxyzine, [facility] evaluation. Note to consider treatment for ADHD.  Recommend to continue current reigmen along with psychiatry collaboration  Chronic Pain: Moderately well controlled; current regimen: oxycodone 10 mg 5 times daily, gabapentin 400 mg TID, ibuprofen 800 mg TID PRN, acetaminophen [phone] mg PRN, methocarbamol 500 mg 2-3 times daily PRN, topiramate 25 mg QPM - notes she stopped this due to exacerbation of GI upset, CNS depression; follows with [facility]  Currently using Miralax every 3 days for constipation. Not regularly using senna/docusate Has script for naloxone at home. In date per Pain Clinic documentation Recommended to continue current regimen at this time along with neurology collaboration   Migraines, Cognitive Dysfunction: Moderately well controlled; current regimen: Ajovy 140 mg Q28 days, rizatriptan 10 mg PRN migraines, Ubrelvy 100 mg PRN migraines; zonisamide 100-200 mg noted to add by Dr. Manuella Ghazi at last visit. Has not been fileld yet.  Recommend to continue current regimen at this time along with neurology collaboration  Dermatologic Conditions - Warts and Dermatitis: Uncontrolled; current regimen: topical salicylic acid and topical flurouricil 5% for warts, topical Eucrisa on weekdays, topical triamcinolone on weekends - reports clobetasol was not covered; follows w/ Dr. Laurence Ferrari Previously recommended to continue current regimen at this time  GERD, Indigestion: Moderately well controlled; current regimen: esomeprazole 40 mg PRN Denies any worsened GERD with starting GLP1. Continue current regimen at this time  Patient Goals/Self-Care Activities Over the next 90  days, patient will:  - take medications as prescribed collaborate with provider on medication access solutions engage in dietary modifications by moderating portion sizes  Follow Up Plan: Telephone follow up appointment with care management team member scheduled for: ~ 4 weeks     Medication Assistance: None required.  Patient affirms current coverage meets needs.  Patient's preferred pharmacy is:  [facility] [address] [place] [address] Phone: [phone] Fax: [phone]   Follow Up:  Patient agrees to Care Plan and Follow-up.  Plan: Telephone follow up appointment with care management team member scheduled for:  4 weeks  Catie Darnelle Maffucci, PharmD, [place], [place] Clinical Pharmacist [employer] [facility] [employer] [phone]

## 2021-05-08 ENCOUNTER — Other Ambulatory Visit: Payer: Self-pay | Admitting: Internal Medicine

## 2021-05-17 ENCOUNTER — Ambulatory Visit (INDEPENDENT_AMBULATORY_CARE_PROVIDER_SITE_OTHER): Payer: Medicare HMO | Admitting: Dermatology

## 2021-05-17 ENCOUNTER — Other Ambulatory Visit: Payer: Self-pay

## 2021-05-17 DIAGNOSIS — L308 Other specified dermatitis: Secondary | ICD-10-CM

## 2021-05-17 DIAGNOSIS — R234 Changes in skin texture: Secondary | ICD-10-CM | POA: Diagnosis not present

## 2021-05-17 DIAGNOSIS — L309 Dermatitis, unspecified: Secondary | ICD-10-CM

## 2021-05-17 MED ORDER — MUPIROCIN 2 % EX OINT: 1.0000 "application " | TOPICAL_OINTMENT | Freq: Every day | CUTANEOUS | 1 refills | Status: DC

## 2021-05-17 MED ORDER — TACROLIMUS 0.1 % EX OINT: TOPICAL_OINTMENT | Freq: Two times a day (BID) | CUTANEOUS | 2 refills | Status: DC

## 2021-05-17 NOTE — Patient Instructions (Addendum)
Start tacrolimus twice daily to hands Continue clobetasol 0.05% ointment twice daily on weekends only as needed when flared. Avoid applying to face, groin, and axilla. Use as directed. Risk of skin atrophy with long-term use reviewed.   Topical steroids (such as triamcinolone, fluocinolone, fluocinonide, mometasone, clobetasol, halobetasol, betamethasone, hydrocortisone) can cause thinning and lightening of the skin if they are used for too long in the same area. Your physician has selected the right strength medicine for your problem and area affected on the body. Please use your medication only as directed by your physician to prevent side effects.   Start vaseline daily to fissure at foot and cover with band aid.  Once healed, may start tacrolimus in the morning and urea at night.   Recommend daily broad spectrum sunscreen SPF 30+ to sun-exposed areas, reapply every 2 hours as needed. Call for new or changing lesions.  Staying in the shade or wearing long sleeves, sun glasses (UVA+UVB protection) and wide brim hats (4-inch brim around the entire circumference of the hat) are also recommended for sun protection.   Recommend taking Heliocare sun protection supplement daily in sunny weather for additional sun protection. For maximum protection on the sunniest days, you can take up to 2 capsules of regular Heliocare OR take 1 capsule of Heliocare Ultra. For prolonged exposure (such as a full day in the sun), you can repeat your dose of the supplement 4 hours after your first dose. Heliocare can be purchased at [facility] or at [email or web address].    If you have any questions or concerns for your doctor, please call our main line at [phone] and press option 4 to reach your doctor's medical assistant. If no one answers, please leave a voicemail as directed and we will return your call as soon as possible. Messages left after 4 pm will be answered the following business day.   You may also  send [place] a message via [place]. We typically respond to MyChart messages within 1-2 business days.  For prescription refills, please ask your pharmacy to contact our office. Our fax number is [phone].  If you have an urgent issue when the clinic is closed that cannot wait until the next business day, you can page your doctor at the number below.    Please note that while we do our best to be available for urgent issues outside of office hours, we are not available 24/7.   If you have an urgent issue and are unable to reach [place], you may choose to seek medical care at your doctor's office, retail clinic, urgent care center, or emergency room.  If you have a medical emergency, please immediately call 911 or go to the emergency department.  Pager Numbers  - Dr. Nehemiah Massed: [phone]  - Dr. Laurence Ferrari: [phone]  - Dr. Nicole Kindred: [phone]  In the event of inclement weather, please call our main line at [phone] for an update on the status of any delays or closures.  Dermatology Medication Tips: Please keep the boxes that topical medications come in in order to help keep track of the instructions about where and how to use these. Pharmacies typically print the medication instructions only on the boxes and not directly on the medication tubes.   If your medication is too expensive, please contact our office at [phone] option 4 or send [place] a message through [place].   We are unable to tell what your co-pay for medications will be in advance as this is different depending on  your insurance coverage. However, we may be able to find a substitute medication at lower cost or fill out paperwork to get insurance to cover a needed medication.   If a prior authorization is required to get your medication covered by your insurance company, please allow [place] 1-2 business days to complete this process.  Drug prices often vary depending on where the prescription is filled and some pharmacies may  offer cheaper prices.  The website www.goodrx.com contains coupons for medications through different pharmacies. The prices here do not account for what the cost may be with help from insurance (it may be cheaper with your insurance), but the website can give you the price if you did not use any insurance.  - You can print the associated coupon and take it with your prescription to the pharmacy.  - You may also stop by our office during regular business hours and pick up a GoodRx coupon card.  - If you need your prescription sent electronically to a different pharmacy, notify our office through [facility] or by phone at [phone] option 4.

## 2021-05-17 NOTE — Progress Notes (Signed)
   Follow-Up Visit   Subjective  Kathleen Everett is a 50 y.o. female who presents for the following: Follow-up (Patient here today for hand dermatitis follow up. Patient using clobetasol ointment a few times weekly. She does not think she ever got the [place]. ) and Warts (Patient here for wart follow up at hands. Warts previously treated with cantharidin plus in office and 5FU at home. Patient not currently treating warts. She also has a fissure at bottom of right foot she would like you to check. ).    The following portions of the chart were reviewed this encounter and updated as appropriate:   Tobacco  Allergies  Meds  Problems  Med Hx  Surg Hx  Fam Hx      Review of Systems:  No other skin or systemic complaints except as noted in HPI or Assessment and Plan.  Objective  Well appearing patient in no apparent distress; mood and affect are within normal limits.  A focused examination was performed including hands, feet. Relevant physical exam findings are noted in the Assessment and Plan.  Right Hand - Anterior Scaly pink plaques at fingers, improved from prior  Right Foot - Anterior Fissure    Assessment & Plan  Hand dermatitis Right Hand - Anterior  Chronic condition with duration or expected duration over one year. Condition is bothersome to patient. Not currently at goal.  Start tacrolimus twice daily to hands Continue clobetasol 0.05% ointment twice daily on weekends only as needed when flared. Avoid applying to face, groin, and axilla. Use as directed. Risk of skin atrophy with long-term use reviewed.   Topical steroids (such as triamcinolone, fluocinolone, fluocinonide, mometasone, clobetasol, halobetasol, betamethasone, hydrocortisone) can cause thinning and lightening of the skin if they are used for too long in the same area. Your physician has selected the right strength medicine for your problem and area affected on the body. Please use your  medication only as directed by your physician to prevent side effects.    tacrolimus (PROTOPIC) 0.1 % ointment - Right Hand - Anterior Apply topically 2 (two) times daily.  Fissure in skin of foot Right Foot - Anterior  Start vaseline daily and cover with band aid.  Once healed, may start urea cream daily to help thin thickened xerotic skin.   Return in about 3 months (around 08/17/2021) for TBSE, Dermatitis.  Graciella Belton, RMA, am acting as scribe for Forest Gleason, MD .  Documentation: I have reviewed the above documentation for accuracy and completeness, and I agree with the above.  Forest Gleason, MD

## 2021-05-24 ENCOUNTER — Other Ambulatory Visit: Payer: Self-pay | Admitting: Internal Medicine

## 2021-05-29 DIAGNOSIS — Z5181 Encounter for therapeutic drug level monitoring: Secondary | ICD-10-CM | POA: Diagnosis not present

## 2021-05-29 DIAGNOSIS — M47816 Spondylosis without myelopathy or radiculopathy, lumbar region: Secondary | ICD-10-CM | POA: Diagnosis not present

## 2021-05-29 DIAGNOSIS — M5412 Radiculopathy, cervical region: Secondary | ICD-10-CM | POA: Diagnosis not present

## 2021-05-29 DIAGNOSIS — M62830 Muscle spasm of back: Secondary | ICD-10-CM | POA: Diagnosis not present

## 2021-05-29 DIAGNOSIS — Z79899 Other long term (current) drug therapy: Secondary | ICD-10-CM | POA: Diagnosis not present

## 2021-05-29 DIAGNOSIS — G894 Chronic pain syndrome: Secondary | ICD-10-CM | POA: Diagnosis not present

## 2021-05-29 DIAGNOSIS — M5442 Lumbago with sciatica, left side: Secondary | ICD-10-CM | POA: Diagnosis not present

## 2021-05-29 DIAGNOSIS — G44209 Tension-type headache, unspecified, not intractable: Secondary | ICD-10-CM | POA: Diagnosis not present

## 2021-05-29 DIAGNOSIS — M1611 Unilateral primary osteoarthritis, right hip: Secondary | ICD-10-CM | POA: Diagnosis not present

## 2021-05-29 DIAGNOSIS — M25551 Pain in right hip: Secondary | ICD-10-CM | POA: Diagnosis not present

## 2021-05-29 DIAGNOSIS — M5137 Other intervertebral disc degeneration, lumbosacral region: Secondary | ICD-10-CM | POA: Diagnosis not present

## 2021-05-30 ENCOUNTER — Ambulatory Visit (INDEPENDENT_AMBULATORY_CARE_PROVIDER_SITE_OTHER): Payer: Medicare HMO | Admitting: Pharmacist

## 2021-05-30 VITALS — Wt 284.4 lb

## 2021-05-30 DIAGNOSIS — E7800 Pure hypercholesterolemia, unspecified: Secondary | ICD-10-CM

## 2021-05-30 DIAGNOSIS — R739 Hyperglycemia, unspecified: Secondary | ICD-10-CM | POA: Diagnosis not present

## 2021-05-30 DIAGNOSIS — Z6839 Body mass index (BMI) 39.0-39.9, adult: Secondary | ICD-10-CM | POA: Diagnosis not present

## 2021-05-30 MED ORDER — OZEMPIC (1 MG/DOSE) 4 MG/3ML ~~LOC~~ SOPN: 1.0000 mg | PEN_INJECTOR | SUBCUTANEOUS | 2 refills | Status: DC

## 2021-05-30 NOTE — Chronic Care Management (AMB) (Signed)
Chronic Care Management Pharmacy Note  05/30/2021 Name:  Kathleen Everett MRN:  [id number] DOB:  1971/10/19   Subjective: Kathleen Everett is an 50 y.o. year old female who is a primary patient of [name], MD.  The CCM team was consulted for assistance with disease management and care coordination needs.    Engaged with patient by telephone for follow up visit in response to provider referral for pharmacy case management and/or care coordination services.   Consent to Services:  The patient was given information about Chronic Care Management services, agreed to services, and gave verbal consent prior to initiation of services.  Please see initial visit note for detailed documentation.   Patient Care Team: [name], MD as PCP - General (Internal Medicine) [name], MD as Referring Physician (Neurology) [name], MD as Consulting Physician (Cardiology) [name], RPH-CPP (Pharmacist)   Objective:  Lab Results  Component Value Date   CREATININE 0.74 03/01/2021   CREATININE 0.95 06/26/2020   CREATININE 0.90 05/11/2020    Lab Results  Component Value Date   HGBA1C 5.7 03/01/2021   Last diabetic Eye exam: No results found for: HMDIABEYEEXA  Last diabetic Foot exam: No results found for: HMDIABFOOTEX      Component Value Date/Time   CHOL 230 (H) 03/01/2021 1209   CHOL 235 (H) 03/19/2012 0445   TRIG 170.0 (H) 03/01/2021 1209   TRIG 258 (H) 03/19/2012 0445   HDL 41.90 03/01/2021 1209   HDL 35 (L) 03/19/2012 0445   CHOLHDL 5 03/01/2021 1209   VLDL 34.0 03/01/2021 1209   VLDL 52 (H) 03/19/2012 0445   LDLCALC 154 (H) 03/01/2021 1209   LDLCALC 148 (H) 03/19/2012 0445   LDLDIRECT 181.0 06/26/2020 0855    Hepatic Function Latest Ref Rng & Units 03/01/2021 06/26/2020 04/06/2020  Total Protein 6.0 - 8.3 g/dL 6.8 6.9 7.2  Albumin 3.5 - 5.2 g/dL 4.2 4.3 4.5  AST 0 - 37 U/L _0 ALT 0 - 35 U/L _1 Alk Phosphatase 39 -  117 U/L 113 110 118(H)  Total Bilirubin 0.2 - 1.2 mg/dL 0.4 0.3 0.4  Bilirubin, Direct 0.0 - 0.3 mg/dL 0.1 0.1 0.1    Lab Results  Component Value Date/Time   TSH 3.44 06/26/2020 08:55 AM   TSH 3.51 06/24/2019 09:24 AM    CBC Latest Ref Rng & Units 06/26/2020 05/11/2020 02/22/2020  WBC 4.0 - 10.5 K/uL 5.7 8.7 6.5  Hemoglobin 12.0 - 15.0 g/dL 12.7 11.9(L) 13.0  Hematocrit 36.0 - 46.0 % 38.5 36.4 38.9  Platelets 150.0 - 400.0 K/uL 248.0 276 245.0    Lab Results  Component Value Date/Time   VD25OH 23.27 (L) 06/18/2018 09:18 AM   VD25OH 28.42 (L) 08/01/2016 08:17 AM    Clinical ASCVD: No  The 10-year ASCVD risk score Mikey Bussing DC Jr., et al., 2013) is: 2%   Values used to calculate the score:     Age: 20 years     Sex: Female     Is Non-Hispanic African American: No     Diabetic: No     Tobacco smoker: No     Systolic Blood Pressure: 254 mmHg     Is BP treated: No     HDL Cholesterol: 41.9 mg/dL     Total Cholesterol: 230 mg/dL     Social History   Tobacco Use  Smoking Status Never  Smokeless Tobacco Never   BP Readings from Last 3  Encounters:  03/19/21 122/71  03/17/21 127/74  03/01/21 128/80   Pulse Readings from Last 3 Encounters:  03/19/21 63  03/17/21 71  03/01/21 88   Wt Readings from Last 3 Encounters:  05/04/21 288 lb 12.8 oz (131 kg)  03/19/21 292 lb (132.5 kg)  03/17/21 299 lb 13.2 oz (136 kg)    Assessment: Review of patient past medical history, allergies, medications, health status, including review of consultants reports, laboratory and other test data, was performed as part of comprehensive evaluation and provision of chronic care management services.   SDOH:  (Social Determinants of Health) assessments and interventions performed:  SDOH Interventions    Flowsheet Row Most Recent Value  SDOH Interventions   Financial Strain Interventions Intervention Not Indicated       CCM Care Plan  Allergies  Allergen Reactions   Vancomycin Hives    Ceftin [Cefuroxime Axetil] Other (See Comments)    Upset stomach   Celexa [Citalopram Hydrobromide] Diarrhea    GI upset     Clindamycin/Lincomycin Rash   Penicillins Rash    Did it involve swelling of the face/tongue/throat, SOB, or low BP? No Did it involve sudden or severe rash/hives, skin peeling, or any reaction on the inside of your mouth or nose? Unknown Did you need to seek medical attention at a hospital or doctor's office? No When did it last happen? childhood reaction If all above answers are "NO", may proceed with cephalosporin use.    Sulfa Antibiotics Rash    Medications Reviewed Today     Reviewed by Charlcie Cradle, MD (Psychiatrist) on 03/22/21 at 1123  Med List Status: <None>   Medication Order Taking? Sig Documenting Provider Last Dose Status Informant  acetaminophen (TYLENOL) 500 MG tablet [id number] Yes Take 500-1,000 mg by mouth 2 (two) times daily as needed for mild pain.  [provider] Taking Active Self  AIMOVIG 140 MG/ML Darden Palmer [id number] Yes  [provider] Taking Active   Ascorbic Acid (VITAMIN C) 500 MG CHEW [id number] Yes Chew 500 mg by mouth daily. [provider] Taking Active Self           Med Note Gentry Roch   Wed May 10, 2020  9:01 AM) Have not had in a couple of weeks  atorvastatin (LIPITOR) 10 MG tablet [id number] Yes Take 1 tablet by mouth once daily [name], MD Taking Active   augmented betamethasone dipropionate (DIPROLENE-AF) 0.05 % cream [id number] Yes Apply to hands twice a day as needed. Avoid applying to face, groin, and axilla. Use as directed. Moye, [place], MD Taking Active   Crisaborole [facility]) 2 % Kerby Moors [id number] Yes Apply to hands twice a day Mon-Fri [place], [place], MD Taking Active   cyanocobalamin (,VITAMIN B-12,) 1000 MCG/ML injection [id number] Yes INJECT ONE ML (CC) INTRAMUSCULARLY ONCE EVERY MONTH  Patient taking differently: Inject 1,000 mcg into the muscle every 30 (thirty) days.    [name], MD Taking Active            Med Note [facility], BRANDY L   Mon Mar 13, 2020  8:32 AM)    diazepam (VALIUM) 5 MG tablet [id number] No Take 5 mg by mouth every 6 (six) hours as needed for anxiety.  Patient not taking: Reported on 03/22/2021   [provider] Not Taking Active   diclofenac sodium (VOLTAREN) 1 % GEL [id number] Yes Apply 1 application topically 4 (four) times daily as needed (pain.).  [provider] Taking Active Self  esomeprazole ([place]) 40  MG capsule [id number] Yes Take 1 capsule by mouth once daily [name], MD Taking Active   estradiol (ESTRACE VAGINAL) 0.1 MG/GM vaginal cream [id number] Yes One applicator q hs x 5 nights and then 2x/week.  Patient taking differently: Place 1 Applicatorful vaginally 2 (two) times a week.   [name], MD Taking Active   fluorouracil (EFUDEX) 5 % cream [id number] Yes Apply topically in the morning and at bedtime. To affected areas at [place], MD Taking Active   furosemide (LASIX) 20 MG tablet [id number] No Take 1 tablet (20 mg total) by mouth daily as needed (As needed for leg swelling).  Patient not taking: Reported on 03/22/2021   [name], MD Not Taking Active Self  gabapentin (NEURONTIN) 400 MG capsule [id number] Yes Take 400 mg by mouth 3 (three) times daily. [provider] Taking Active Self  ibuprofen (ADVIL) 800 MG tablet [id number] Yes Take 800 mg by mouth 3 (three) times daily as needed (Pain).  [provider] Taking Active Self  lactase (RA DAIRY AID) 3000 units tablet [id number] Yes Take 9,000 Units by mouth 3 (three) times daily as needed (with dairy consumption).  [provider] Taking Active Self  methocarbamol (ROBAXIN) 500 MG tablet [id number] Yes Take 500 mg by mouth 4 (four) times daily. [provider] Taking Active   mupirocin ointment (BACTROBAN) 2 % [id number] Yes Apply 1 application topically daily. Moye, [place], MD  Taking Active   NARCAN 4 MG/0.1ML LIQD nasal spray kit [id number] No Place 0.4 mg into the nose once.  Patient not taking: Reported on 03/22/2021   [provider] Not Taking Active   NON FORMULARY [id number] Yes Take 2 capsules by mouth daily. Thrive vitamins with shake and derma patch [provider] Taking Active Self  nystatin cream (MYCOSTATIN) [id number] Yes APPLY TOPICALLY TWICE DAILY Crecencio Mc, MD Taking Active Self  Oxycodone HCl 10 MG TABS [id number] Yes Take 10 mg by mouth 5 (five) times daily.  [provider] Taking Active Self           Med Note Kenton Kingfisher, Kathrine Haddock Nov 30, 2019  8:20 AM)    potassium chloride (KLOR-CON) 10 MEQ tablet [id number]  Take 1 tablet (10 mEq total) by mouth daily as needed (As needed with Furosemide (Lasix)). [name], MD  Expired 06/11/20 2359 Self  rizatriptan (MAXALT) 10 MG tablet [id number] Yes Take 10 mg by mouth as needed for migraine. May repeat in 2 hours if needed [provider] Taking Active Self           Med Note Gentry Roch   Wed May 10, 2020  9:00 AM)    sertraline (ZOLOFT) 100 MG tablet [id number]  Take 1.5 tablets (150 mg total) by mouth daily for 8 days, THEN 2 tablets (200 mg total) daily. Charlcie Cradle, MD  Active   Simethicone (GAS RELIEF PO) [id number] Yes Take 125 mg by mouth 2 (two) times daily as needed for flatulence. Anti gas [provider] Taking Active Self  topiramate (TOPAMAX) 25 MG tablet [id number] Yes Take 25 mg by mouth 2 (two) times daily. [provider] Taking Active   triamcinolone cream (KENALOG) 0.1 % [id number] Yes APPLY  CREAM EXTERNALLY TWICE DAILY TO AFFECTED AREA ON  FINGERS [name], MD Taking Active             Patient Active Problem List   Diagnosis Date Noted   Vitamin B12 deficiency  Vaginal irritation 03/03/2021   Soft tissue mass 03/03/2021   Hemorrhoids 12/23/2020   DDD (degenerative disc disease), lumbar  10/03/2020   Abscess of right hip 05/11/2020   Anemia 01/02/2020   Status post total hip replacement, 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   Hyperglycemia 06/24/2019   Vaginal bleeding 09/30/2016   Primary osteoarthritis of right hip 01/17/2016   Chronic gastritis 07/18/2015   Major depressive disorder 05/01/2015   Generalized anxiety disorder 05/01/2015   Chronic tension-type headache, intractable 04/24/2015   Dysmenorrhea 04/24/2015   Endometriosis 04/24/2015   Hidradenitis 04/24/2015   Hot flashes 03/12/2015   Chest pain 02/15/2015   BMI 40.0-44.9, adult ([place]) 10/16/2014   Breast pain, right 09/08/2014   Abdominal pain 07/24/2014   GERD (gastroesophageal reflux disease) 04/24/2014   Ankle pain, right 04/24/2014   Chiari malformation type I 01/26/2014   Vitamin D deficiency 01/18/2014   Diverticulitis 01/10/2014   Hypercholesteremia 01/10/2014   Right hip pain 01/10/2014   Migraine headaches 01/10/2014   Obstructive sleep apnea 01/10/2014   Chronic back pain 12/13/2012   Mitral valve prolapse 12/13/2012     There is no immunization history on file for this patient.  Conditions to be addressed/monitored: HTN and chronic pain, migraines, obesity  Care Plan : Medication Management  Updates made by [name], RPH-CPP since 05/30/2021 12:00 AM     Problem: Obesity, Chronic Pain, Migraines, Depression      Long-Range Goal: Disease Progression Prevention   Start Date: 03/28/2021  Recent Progress: On track  Priority: High  Note:   Current Barriers:  Unable to achieve control of weight  Complex patient with multiple comorbidities at a high risk for exacerbation and hospitalization  Pharmacist Clinical Goal(s):  Over the next 90 days, patient will achieve improvement of weight through collaboration with PharmD and provider.   Interventions: 1:1 collaboration with [name], MD regarding development and update of comprehensive plan of care as evidenced by provider  attestation and co-signature Inter-disciplinary care team collaboration (see longitudinal plan of care) Comprehensive medication review performed; medication list updated in electronic medical record  SDOH: No medication cost concerns, Medicare + Medicaid   Hyperglycemia, Obesity: Unable to achieve weight loss through diet and exercise alone; current regimen: Ozempic 0.5 mg Reports some nausea for the first 1-2 days, some constipation. Notes that her appetite has returned in the past week Does note decreased appetite. Denies significant nausea, vomiting, worsened constipation, diarrhea, stomach pain.  Baseline weight: 292 lbs; most recent home weight: 284.4 lbs; total weight loss thus far 6 lbs (2% loss from baseline) Hx bariatric clinic, but decided not to proceed with surgery - benefited from the nutrition class, but husband did not want her to pursue surgery Hx bupropion, ineffective for mental health/weight management Hx topiramate, intolerable side effects of GI upset, CNS depression Hx phentermine years ago  Current meal patterns: focusing on reduced carbohydrate choices when she does eat Current exercise: limited by chronic pain;  Recommend to increase Ozempic to 1 mg weekly. She will complete home supply of 0.5 mg and then increase to 1 mg. Follow up in 4 weeks. Counseled that there may be increased risk of stomach upset, queasiness when increasing dose and that this should improve, but if severe GI upset to stop the medicine and contact us.    Hyperlipidemia: Controlled per last lipid panel; current treatment: atorvastatin 10 mg daily  Previously recommended to continue current regimen at this time  Depression/Anxiety Uncontrolled; current treatment: sertraline 200 mg daily, buspirone 10  mg QPM; follows w/ Dr. Doyne Keel Per documentation, hx: citalopram, escitalopram, fluoxetine, venlafaxine, duloxetine, hydroxyzine, lithium, quetiapine, hydroxyzine, [facility]  evaluation. Note to consider treatment for ADHD.  Previously recommend to continue current reigmen along with psychiatry collaboration  Chronic Pain: Moderately well controlled; current regimen: oxycodone 10 mg 5 times daily, gabapentin 400 mg TID, ibuprofen 800 mg TID PRN, acetaminophen [phone] mg PRN, methocarbamol 500 mg 2-3 times daily PRN, topiramate 25 mg QPM - notes she stopped this due to exacerbation of GI upset, CNS depression; follows with [facility]  Currently using Miralax every 3 days for constipation. Not regularly using senna/docusate Has script for naloxone at home. In date per Pain Clinic documentation Previously recommended to continue current regimen at this time along with neurology collaboration   Migraines, Cognitive Dysfunction: Moderately well controlled; current regimen: Ajovy 140 mg Q28 days, rizatriptan 10 mg PRN migraines, Ubrelvy 100 mg PRN migraines; zonisamide 100-200 mg noted to add by Dr. Manuella Ghazi at last visit.  Previously recommended to continue current regimen at this time along with neurology collaboration  Dermatologic Conditions - Warts and Dermatitis: Uncontrolled; current regimen: tacrolimus 0.1% ointment BID to hands, continue clobetasol 0.05% ointment BID on weekends and PRN; follows w/ Dr. Laurence Ferrari Previously recommended to continue current regimen at this time  GERD, Indigestion: Moderately well controlled; current regimen: esomeprazole 40 mg PRN Denies any worsened GERD with starting GLP1. Previously recommended to continue current regimen at this time  Patient Goals/Self-Care Activities Over the next 90 days, patient will:  - take medications as prescribed collaborate with provider on medication access solutions engage in dietary modifications by moderating portion sizes  Follow Up Plan: Telephone follow up appointment with care management team member scheduled for: ~ 4 weeks     Medication Assistance: None required.  Patient  affirms current coverage meets needs.  Patient's preferred pharmacy is:  [facility] [address], [place] - [place] [address] [place] [address] Phone: [phone] Fax: [place], [place] [address] [place] [address] Phone: [phone] Fax: [phone]   Follow Up:  Patient agrees to Care Plan and Follow-up.  Plan: Telephone follow up appointment with care management team member scheduled for:  ~ 4 weeks  Catie Darnelle Maffucci, PharmD, [place], [place] Clinical Pharmacist [employer] [facility] [employer] [phone]

## 2021-05-30 NOTE — Patient Instructions (Signed)
Visit Information  PATIENT GOALS:  Goals Addressed               This Visit's Progress     Patient Stated     Medication Monitoring (pt-stated)        Patient Goals/Self-Care Activities Over the next 90 days, patient will:  - take medications as prescribed collaborate with provider on medication access solutions engage in dietary modifications by moderating portion sizes          Patient verbalizes understanding of instructions provided today and agrees to view in [place].   Plan: Telephone follow up appointment with care management team member scheduled for:  ~ 4 weeks  Catie Darnelle Maffucci, PharmD, [place], [place] Clinical Pharmacist [employer] [facility] [employer] [phone]

## 2021-05-31 ENCOUNTER — Other Ambulatory Visit: Payer: Self-pay

## 2021-05-31 ENCOUNTER — Telehealth (INDEPENDENT_AMBULATORY_CARE_PROVIDER_SITE_OTHER): Payer: Medicare HMO | Admitting: Psychiatry

## 2021-05-31 DIAGNOSIS — F331 Major depressive disorder, recurrent, moderate: Secondary | ICD-10-CM | POA: Diagnosis not present

## 2021-05-31 DIAGNOSIS — F411 Generalized anxiety disorder: Secondary | ICD-10-CM

## 2021-05-31 MED ORDER — METHYLPHENIDATE HCL 5 MG PO TABS: 5.0000 mg | ORAL_TABLET | Freq: Every day | ORAL | 0 refills | Status: DC

## 2021-05-31 MED ORDER — SERTRALINE HCL 100 MG PO TABS: 200.0000 mg | ORAL_TABLET | Freq: Every day | ORAL | 0 refills | Status: DC

## 2021-05-31 NOTE — Progress Notes (Signed)
Virtual Visit via Telephone Note  I connected with Kathleen Everett on 05/31/21 at  9:30 AM EDT by telephone and verified that I am speaking with the correct person using two identifiers. We were unable to connect by video due to a "system error".   Location: Patient: home Provider: office   I discussed the limitations, risks, security and privacy concerns of performing an evaluation and management service by telephone and the availability of in person appointments. I also discussed with the patient that there may be a patient responsible charge related to this service. The patient expressed understanding and agreed to proceed.   History of Present Illness: Kathleen Everett notes that she has been feeling more fatigued and unmotivated since starting Buspar. She does note that she is crying less often. Her depression is ongoing. She is spending more time in bed. Kathleen Everett is not feeling like herself. Her sleep is interrupted due to pain. She wakes up several times a night. Kathleen Everett feels lost some days. She is endorsing some desire to isolate and is irritable. There are days she doesn't want to interact with her kids and husband and just wants to be left alone. Her appetite is decreased due to Ozempic. Sometime she feels empty and thinks she would be better off dead. Kathleen Everett denies SI/HI.    Observations/Objective:  General Appearance: unable to assess  Eye Contact:  unable to assess  Speech:  Clear and Coherent and Normal Rate  Volume:  Normal  Mood:  Anxious and Depressed  Affect:  Congruent  Thought Process:  Goal Directed, Linear, and Descriptions of Associations: Intact  Orientation:  Full (Time, Place, and Person)  Thought Content:  Logical  Suicidal Thoughts:  No  Homicidal Thoughts:  No  Memory:  Immediate;   Good  Judgement:  Good  Insight:  Good  Psychomotor Activity: unable to assess  Concentration:  Concentration: Good  Recall:  Good  Fund of Knowledge:  Good  Language:   Good  Akathisia:  unable to assess  Handed:  Right  AIMS (if indicated):     Assets:  Communication Skills Desire for Improvement Financial Resources/Insurance Housing Intimacy Leisure Time Resilience Social Support Talents/Skills Transportation Vocational/Educational  ADL's:  unable to assess  Cognition:  WNL  Sleep:        Assessment and Plan: Depression screen [facility] 2/9 05/31/2021 03/22/2021 02/08/2021 01/11/2021 06/26/2020  Decreased Interest 2 1 3 1 3   Down, Depressed, Hopeless 2 3 3 2 3   PHQ - 2 Score 4 4 6 3 6   Altered sleeping 3 3 3 3 3   Tired, decreased energy 3 2 3 1 2   Change in appetite 1 0 0 1 2  Feeling bad or failure about yourself  3 3 2 1 3   Trouble concentrating 3 0 2 1 3   Moving slowly or fidgety/restless - 0 2 0 3  Suicidal thoughts 1 0 1 0 1  PHQ-9 Score 18 12 19 10 23   Difficult doing work/chores Extremely dIfficult Very difficult Very difficult Somewhat difficult Extremely dIfficult  Some recent data might be hidden    Flowsheet Row Video Visit from 05/31/2021 in [facility] Video Visit from 03/22/2021 in [facility] Admission (Discharged) from 03/19/2021 in [place] Error: Q3, 4, or 5 should not be populated when Q2 is No Error: Q3, 4, or 5 should not be populated when Q2 is No No Risk  D/c Buspar due to no noticeable improvement  Start trial of Ritalin 5mg  po qD to target MDD  1. MDD (major depressive disorder), recurrent episode, moderate (HCC) - sertraline (ZOLOFT) 100 MG tablet; Take 2 tablets (200 mg total) by mouth daily.  Dispense: 180 tablet; Refill: 0 - methylphenidate (RITALIN) 5 MG tablet; Take 1 tablet (5 mg total) by mouth daily.  Dispense: 30 tablet; Refill: 0 - methylphenidate (RITALIN) 5 MG tablet; Take 1 tablet (5 mg total) by mouth daily.  Dispense: 30 tablet; Refill: 0  2. Generalized anxiety  disorder - sertraline (ZOLOFT) 100 MG tablet; Take 2 tablets (200 mg total) by mouth daily.  Dispense: 180 tablet; Refill: 0    Follow Up Instructions: In 3-4 weeks or sooner if needed   I discussed the assessment and treatment plan with the patient. The patient was provided an opportunity to ask questions and all were answered. The patient agreed with the plan and demonstrated an understanding of the instructions.   The patient was advised to call back or seek an in-person evaluation if the symptoms worsen or if the condition fails to improve as anticipated.  I provided 17 minutes of non-face-to-face time during this encounter.   Charlcie Cradle, MD

## 2021-06-03 ENCOUNTER — Encounter: Payer: Self-pay | Admitting: Dermatology

## 2021-06-07 DIAGNOSIS — H524 Presbyopia: Secondary | ICD-10-CM | POA: Diagnosis not present

## 2021-06-18 DIAGNOSIS — G4733 Obstructive sleep apnea (adult) (pediatric): Secondary | ICD-10-CM | POA: Diagnosis not present

## 2021-06-28 ENCOUNTER — Telehealth (INDEPENDENT_AMBULATORY_CARE_PROVIDER_SITE_OTHER): Payer: Medicare HMO | Admitting: Psychiatry

## 2021-06-28 ENCOUNTER — Other Ambulatory Visit: Payer: Self-pay

## 2021-06-28 DIAGNOSIS — F411 Generalized anxiety disorder: Secondary | ICD-10-CM | POA: Diagnosis not present

## 2021-06-28 DIAGNOSIS — F331 Major depressive disorder, recurrent, moderate: Secondary | ICD-10-CM

## 2021-06-28 MED ORDER — SERTRALINE HCL 100 MG PO TABS: 200.0000 mg | ORAL_TABLET | Freq: Every day | ORAL | 0 refills | Status: DC

## 2021-06-28 MED ORDER — METHYLPHENIDATE HCL 10 MG PO TABS: 10.0000 mg | ORAL_TABLET | Freq: Every day | ORAL | 0 refills | Status: DC

## 2021-06-28 NOTE — Progress Notes (Signed)
Virtual Visit via Telephone Note  I connected with Riana Collinson on 06/28/21 at  1:00 PM EDT by telephone and verified that I am speaking with the correct person using two identifiers.  Location: Patient: home Provider: office   I discussed the limitations, risks, security and privacy concerns of performing an evaluation and management service by telephone and the availability of in person appointments. I also discussed with the patient that there may be a patient responsible charge related to this service. The patient expressed understanding and agreed to proceed.   History of Present Illness: Zakayla Yauch is still easily frustrated. She gets upset with herself because her motivation to do most things is low. Her focus and productivity are low. Her depression is unchanged. She has on/off crying spells. She often feels disconnected from everything. Norberta Cromartie often wants to run away. She denies SI/HI. Nyelah Valazquez goes to Bible study once/week with her mom. Often times it is difficult to get her words out and is not sure if that due to the Chiari Malformation or something else. A couple of weeks ago she hurt her back and has not been sleeping well. She does not like the CPAP mask and sometimes wakes up feeling panicked because of it. Harla Popke feels depressed everyday and the average level is 6/10 (10 being the worst). Some days are worse than others and she spends all day doing nothing. Her puppy has brought her some happiness and relief. Her appetite comes and goes. Kalimah Brannick has racing thoughts, rumination on stressors and inability to relax on a daily basis.    Observations/Objective:  General Appearance: unable to assess  Eye Contact:  unable to assess  Speech:  Clear and Coherent and Normal Rate  Volume:  Normal  Mood:  Anxious and Depressed  Affect:  Congruent  Thought Process:  Goal Directed, Linear, and Descriptions of Associations: Intact  Orientation:  Full (Time, Place, and  Person)  Thought Content:  Logical  Suicidal Thoughts:  No  Homicidal Thoughts:  No  Memory:  Immediate;   Good  Judgement:  Good  Insight:  Good  Psychomotor Activity: unable to assess  Concentration:  Concentration: Good  Recall:  Good  Fund of Knowledge:  Good  Language:  Good  Akathisia:  unable to assess  Handed:  unable to assess  AIMS (if indicated):     Assets:  Communication Skills Desire for Improvement Financial Resources/Insurance Housing Intimacy Social Support Talents/Skills Transportation Vocational/Educational  ADL's:  unable to assess  Cognition:  WNL  Sleep:        Assessment and Plan: Depression screen [facility] 2/9 06/28/2021 05/31/2021 03/22/2021 02/08/2021 01/11/2021  Decreased Interest '3 2 1 3 1  '$ Down, Depressed, Hopeless '3 2 3 3 2  '$ PHQ - 2 Score '6 4 4 6 3  '$ Altered sleeping '3 3 3 3 3  '$ Tired, decreased energy '3 3 2 3 1  '$ Change in appetite 1 1 0 0 1  Feeling bad or failure about yourself  '3 3 3 2 1  '$ Trouble concentrating 3 3 0 2 1  Moving slowly or fidgety/restless 0 - 0 2 0  Suicidal thoughts 0 1 0 1 0  PHQ-9 Score '19 18 12 19 10  '$ Difficult doing work/chores Very difficult Extremely dIfficult Very difficult Very difficult Somewhat difficult  Some recent data might be hidden    Flowsheet Row Video Visit from 06/28/2021 in [facility] Video Visit from 05/31/2021 in [facility] Video Visit from 03/22/2021 in [place] Error: Q3, 4, or 5 should not be populated when Q2 is No Error: Q3, 4, or 5 should not be populated when Q2 is No Error: Q3, 4, or 5 should not be populated when Q2 is No      1. MDD (major depressive disorder), recurrent episode, moderate (HCC) - sertraline (ZOLOFT) 100 MG tablet; Take 2 tablets (200 mg total) by mouth daily.  Dispense: 180 tablet; Refill: 0 - methylphenidate (RITALIN) 10 MG tablet; Take  1 tablet (10 mg total) by mouth daily.  Dispense: 30 tablet; Refill: 0 - methylphenidate (RITALIN) 10 MG tablet; Take 1 tablet (10 mg total) by mouth daily.  Dispense: 30 tablet; Refill: 0  2. Generalized anxiety disorder - sertraline (ZOLOFT) 100 MG tablet; Take 2 tablets (200 mg total) by mouth daily.  Dispense: 180 tablet; Refill: 0   Follow Up Instructions: In 2-3 months or sooner if needed   I discussed the assessment and treatment plan with the patient. The patient was provided an opportunity to ask questions and all were answered. The patient agreed with the plan and demonstrated an understanding of the instructions.   The patient was advised to call back or seek an in-person evaluation if the symptoms worsen or if the condition fails to improve as anticipated.  I provided 16 minutes of non-face-to-face time during this encounter.   Charlcie Cradle, MD

## 2021-07-03 ENCOUNTER — Telehealth: Payer: Medicare HMO

## 2021-07-03 ENCOUNTER — Ambulatory Visit (INDEPENDENT_AMBULATORY_CARE_PROVIDER_SITE_OTHER): Payer: Medicare HMO | Admitting: Pharmacist

## 2021-07-03 VITALS — Wt 275.0 lb

## 2021-07-03 DIAGNOSIS — R739 Hyperglycemia, unspecified: Secondary | ICD-10-CM | POA: Diagnosis not present

## 2021-07-03 DIAGNOSIS — E669 Obesity, unspecified: Secondary | ICD-10-CM

## 2021-07-03 DIAGNOSIS — Z6838 Body mass index (BMI) 38.0-38.9, adult: Secondary | ICD-10-CM

## 2021-07-03 DIAGNOSIS — E7800 Pure hypercholesterolemia, unspecified: Secondary | ICD-10-CM

## 2021-07-03 MED ORDER — ATORVASTATIN CALCIUM 10 MG PO TABS: 10.0000 mg | ORAL_TABLET | Freq: Every day | ORAL | 3 refills | Status: DC

## 2021-07-03 MED ORDER — SEMAGLUTIDE (2 MG/DOSE) 8 MG/3ML ~~LOC~~ SOPN: 2.0000 mg | PEN_INJECTOR | SUBCUTANEOUS | 2 refills | Status: DC

## 2021-07-03 NOTE — Patient Instructions (Signed)
Visit Information  PATIENT GOALS:  Goals Addressed               This Visit's Progress     Patient Stated     Medication Monitoring (pt-stated)        Patient Goals/Self-Care Activities Over the next 90 days, patient will:  - take medications as prescribed collaborate with provider on medication access solutions engage in dietary modifications by moderating portion sizes        Patient verbalizes understanding of instructions provided today and agrees to view in [place].   Plan: Telephone follow up appointment with care management team member scheduled for:  ~ 8 weeks  Catie Darnelle Maffucci, PharmD, [place], [place] Clinical Pharmacist [employer] [facility] [employer] [phone]

## 2021-07-03 NOTE — Chronic Care Management (AMB) (Signed)
Chronic Care Management Pharmacy Note  07/03/2021 Name:  Kathleen Everett MRN:  [id number] DOB:  May 30, 1971   Subjective: Kathleen Everett is an 50 y.o. year old female who is a primary patient of [name], MD.  The CCM team was consulted for assistance with disease management and care coordination needs.    Engaged with patient by telephone for follow up visit in response to provider referral for pharmacy case management and/or care coordination services.   Consent to Services:  The patient was given information about Chronic Care Management services, agreed to services, and gave verbal consent prior to initiation of services.  Please see initial visit note for detailed documentation.   Patient Care Team: [name], MD as PCP - General (Internal Medicine) [name], MD as Referring Physician (Neurology) [name], MD as Consulting Physician (Cardiology) [name], RPH-CPP (Pharmacist)  Recent consult visits: 7/14 - psychiatry Doyne Keel  - d/c buspirone, added methylphenidate 5 mg daily 8/11 - [place] - increased methylphenidate to 10 mg    Objective:  Lab Results  Component Value Date   CREATININE 0.74 03/01/2021   CREATININE 0.95 06/26/2020   CREATININE 0.90 05/11/2020    Lab Results  Component Value Date   HGBA1C 5.7 03/01/2021   Last diabetic Eye exam: No results found for: HMDIABEYEEXA  Last diabetic Foot exam: No results found for: HMDIABFOOTEX      Component Value Date/Time   CHOL 230 (H) 03/01/2021 1209   CHOL 235 (H) 03/19/2012 0445   TRIG 170.0 (H) 03/01/2021 1209   TRIG 258 (H) 03/19/2012 0445   HDL 41.90 03/01/2021 1209   HDL 35 (L) 03/19/2012 0445   CHOLHDL 5 03/01/2021 1209   VLDL 34.0 03/01/2021 1209   VLDL 52 (H) 03/19/2012 0445   LDLCALC 154 (H) 03/01/2021 1209   LDLCALC 148 (H) 03/19/2012 0445   LDLDIRECT 181.0 06/26/2020 0855    Hepatic Function Latest Ref Rng & Units 03/01/2021 06/26/2020  04/06/2020  Total Protein 6.0 - 8.3 g/dL 6.8 6.9 7.2  Albumin 3.5 - 5.2 g/dL 4.2 4.3 4.5  AST 0 - 37 U/L 23 22 19   ALT 0 - 35 U/L 24 28 23   Alk Phosphatase 39 - 117 U/L 113 110 118(H)  Total Bilirubin 0.2 - 1.2 mg/dL 0.4 0.3 0.4  Bilirubin, Direct 0.0 - 0.3 mg/dL 0.1 0.1 0.1    Lab Results  Component Value Date/Time   TSH 3.44 06/26/2020 08:55 AM   TSH 3.51 06/24/2019 09:24 AM    CBC Latest Ref Rng & Units 06/26/2020 05/11/2020 02/22/2020  WBC 4.0 - 10.5 K/uL 5.7 8.7 6.5  Hemoglobin 12.0 - 15.0 g/dL 12.7 11.9(L) 13.0  Hematocrit 36.0 - 46.0 % 38.5 36.4 38.9  Platelets 150.0 - 400.0 K/uL 248.0 276 245.0    Lab Results  Component Value Date/Time   VD25OH 23.27 (L) 06/18/2018 09:18 AM   VD25OH 28.42 (L) 08/01/2016 08:17 AM    Clinical ASCVD: No  The 10-year ASCVD risk score Mikey Bussing DC Jr., et al., 2013) is: 2.1%   Values used to calculate the score:     Age: 76 years     Sex: Female     Is Non-Hispanic African American: No     Diabetic: No     Tobacco smoker: No     Systolic Blood Pressure: 101 mmHg     Is BP treated: No     HDL Cholesterol: 41.9 mg/dL     Total Cholesterol:  230 mg/dL      Social History   Tobacco Use  Smoking Status Never  Smokeless Tobacco Never   BP Readings from Last 3 Encounters:  03/19/21 122/71  03/17/21 127/74  03/01/21 128/80   Pulse Readings from Last 3 Encounters:  03/19/21 63  03/17/21 71  03/01/21 88   Wt Readings from Last 3 Encounters:  07/03/21 275 lb (124.7 kg)  05/30/21 284 lb 6.4 oz (129 kg)  05/04/21 288 lb 12.8 oz (131 kg)    Assessment: Review of patient past medical history, allergies, medications, health status, including review of consultants reports, laboratory and other test data, was performed as part of comprehensive evaluation and provision of chronic care management services.   SDOH:  (Social Determinants of Health) assessments and interventions performed:  SDOH Interventions    Flowsheet Row Most Recent Value   SDOH Interventions   Financial Strain Interventions Intervention Not Indicated       CCM Care Plan  Allergies  Allergen Reactions   Vancomycin Hives   Ceftin [Cefuroxime Axetil] Other (See Comments)    Upset stomach   Celexa [Citalopram Hydrobromide] Diarrhea    GI upset     Clindamycin/Lincomycin Rash   Penicillins Rash    Did it involve swelling of the face/tongue/throat, SOB, or low BP? No Did it involve sudden or severe rash/hives, skin peeling, or any reaction on the inside of your mouth or nose? Unknown Did you need to seek medical attention at a hospital or doctor's office? No When did it last happen? childhood reaction If all above answers are "NO", may proceed with cephalosporin use.    Sulfa Antibiotics Rash    Medications Reviewed Today     Reviewed by Charlcie Cradle, MD (Psychiatrist) on 06/28/21 at 41  Med List Status: <None>   Medication Order Taking? Sig Documenting Provider Last Dose Status Informant  acetaminophen (TYLENOL) 500 MG tablet [id number] Yes Take 500-1,000 mg by mouth 2 (two) times daily as needed for mild pain.  [provider] Taking Active Self           Med Note Darnelle Maffucci, Arville Lime   Wed Mar 28, 2021  1:49 PM) Taking at least 1-2 times daily  AIMOVIG 140 MG/ML SOAJ [id number] Yes Inject 140 mg into the skin every 28 (twenty-eight) days. [provider] Taking Active   Ascorbic Acid (VITAMIN C) 500 MG CHEW [id number] Yes Chew 500 mg by mouth daily. [provider] Taking Active            Med Note Darnelle Maffucci, Arville Lime   Wed Mar 28, 2021  1:50 PM)    atorvastatin (LIPITOR) 10 MG tablet [id number] Yes Take 1 tablet by mouth once daily [name], MD Taking Active   augmented betamethasone dipropionate (DIPROLENE-AF) 0.05 % cream [id number] No Apply to hands twice a day as needed. Avoid applying to face, groin, and axilla. Use as directed.  Patient not taking: Reported on 06/28/2021   [facility], [place], MD Not Taking  Active   cyanocobalamin (,VITAMIN B-12,) 1000 MCG/ML injection [id number] Yes INJECT ONE ML (CC) INTRAMUSCULARLY ONCE EVERY MONTH  Patient taking differently: Inject 1,000 mcg into the muscle every 30 (thirty) days.   [name], MD Taking Active            Med Note [facility], BRANDY L   Mon Mar 13, 2020  8:32 AM)    diclofenac sodium (VOLTAREN) 1 % GEL [id number] Yes Apply 1 application topically 4 (four) times daily  as needed (pain.).  [provider] Taking Active Self           Med Note [name]   Wed Mar 28, 2021  1:53 PM) PRN  esomeprazole (NEXIUM) 40 MG capsule [id number] Yes Take 1 capsule by mouth once daily [name], MD Taking Active   estradiol (ESTRACE VAGINAL) 0.1 MG/GM vaginal cream [id number] Yes One applicator q hs x 5 nights and then 2x/week.  Patient taking differently: Place 1 Applicatorful vaginally once a week.   [name], MD Taking Active   fluorouracil (EFUDEX) 5 % cream [id number] No Apply topically in the morning and at bedtime. To affected areas at hands  Patient not taking: Reported on 06/28/2021   [facility], [place], MD Not Taking Active   furosemide (LASIX) 20 MG tablet [id number] No Take 20 mg by mouth as needed.  Patient not taking: Reported on 06/28/2021   [provider] Not Taking Active   gabapentin (NEURONTIN) 400 MG capsule [id number] Yes Take 400 mg by mouth 3 (three) times daily. [provider] Taking Active Self  ibuprofen (ADVIL) 800 MG tablet [id number] Yes Take 800 mg by mouth 3 (three) times daily as needed (Pain).  [provider] Taking Active Self           Med Note Darnelle Maffucci, Arville Lime   Wed Mar 28, 2021  1:56 PM) PRN  lactase (RA DAIRY AID) 3000 units tablet [id number] Yes Take 9,000 Units by mouth 3 (three) times daily as needed (with dairy consumption).  [provider] Taking Active Self  methocarbamol (ROBAXIN) 500 MG tablet [id number] Yes Take 500 mg by mouth 4 (four)  times daily. [provider] Taking Active            Med Note [name]   Wed Mar 28, 2021  1:57 PM) Using ~2-4 times daily  methylphenidate (RITALIN) 5 MG tablet [id number] Yes Take 1 tablet (5 mg total) by mouth daily. Charlcie Cradle, MD Taking Active   methylphenidate (RITALIN) 5 MG tablet [id number] Yes Take 1 tablet (5 mg total) by mouth daily. Charlcie Cradle, MD Taking Active   mupirocin ointment (BACTROBAN) 2 % [id number] Yes Apply 1 application topically daily. Moye, [place], MD Taking Active   naloxone [facility]) 2 MG/2ML injection [id number] Yes Inject into the vein as needed. [provider] Taking Active   NON FORMULARY [id number] No Take 2 capsules by mouth daily. Thrive vitamins with shake and derma patch  Patient not taking: Reported on 06/28/2021   [provider] Not Taking Active Self  nystatin cream (MYCOSTATIN) [id number] Yes APPLY TOPICALLY TWICE DAILY Crecencio Mc, MD Taking Active Self  Oxycodone HCl 10 MG TABS [id number] Yes Take 10 mg by mouth 5 (five) times daily.  [provider] Taking Active Self           Med Note Kenton Kingfisher, Kathrine Haddock Nov 30, 2019  8:20 AM)    potassium chloride (KLOR-CON) 10 MEQ tablet [id number]  Take 1 tablet (10 mEq total) by mouth daily as needed (As needed with Furosemide (Lasix)). [name], MD  Expired 06/11/20 2359 Self  rizatriptan (MAXALT) 10 MG tablet [id number] Yes Take 10 mg by mouth as needed for migraine. May repeat in 2 hours if needed [provider] Taking Active Self           Med Note Gentry Roch   Wed May 10, 2020  9:00 AM)    Semaglutide, 1 MG/DOSE, (  OZEMPIC, 1 MG/DOSE,) 4 MG/3ML SOPN [id number] Yes Inject 1 mg into the skin once a week. [name], MD Taking Active   sertraline (ZOLOFT) 100 MG tablet [id number] Yes Take 2 tablets (200 mg total) by mouth daily. Charlcie Cradle, MD Taking Active   Simethicone (GAS RELIEF PO) [id number] Yes Take 125 mg by  mouth 2 (two) times daily as needed for flatulence. Anti gas [provider] Taking Active Self  tacrolimus (PROTOPIC) 0.1 % ointment [id number] Yes Apply topically 2 (two) times daily. Moye, [place], MD Taking Active   triamcinolone cream (KENALOG) 0.1 % [id number] Yes APPLY  CREAM EXTERNALLY TWICE DAILY TO AFFECTED AREA ON  FINGERS [name], MD Taking Active   Ubrogepant (UBRELVY) 100 MG TABS [id number] Yes Take 100 mg by mouth 2 (two) times daily as needed. [provider] Taking Active             Patient Active Problem List   Diagnosis Date Noted   Vitamin B12 deficiency    Vaginal irritation 03/03/2021   Soft tissue mass 03/03/2021   Hemorrhoids 12/23/2020   DDD (degenerative disc disease), lumbar 10/03/2020   Abscess of right hip 05/11/2020   Anemia 01/02/2020   Status post total hip replacement, 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   Hyperglycemia 06/24/2019   Vaginal bleeding 09/30/2016   Primary osteoarthritis of right hip 01/17/2016   Chronic gastritis 07/18/2015   Major depressive disorder 05/01/2015   Generalized anxiety disorder 05/01/2015   Chronic tension-type headache, intractable 04/24/2015   Dysmenorrhea 04/24/2015   Endometriosis 04/24/2015   Hidradenitis 04/24/2015   Hot flashes 03/12/2015   Chest pain 02/15/2015   BMI 40.0-44.9, adult ([place]) 10/16/2014   Breast pain, right 09/08/2014   Abdominal pain 07/24/2014   GERD (gastroesophageal reflux disease) 04/24/2014   Ankle pain, right 04/24/2014   Chiari malformation type I 01/26/2014   Vitamin D deficiency 01/18/2014   Diverticulitis 01/10/2014   Hypercholesteremia 01/10/2014   Right hip pain 01/10/2014   Migraine headaches 01/10/2014   Obstructive sleep apnea 01/10/2014   Chronic back pain 12/13/2012   Mitral valve prolapse 12/13/2012     There is no immunization history on file for this patient.  Conditions to be addressed/monitored: HLD and Obesity, chronic pain  Care Plan : Medication  Management  Updates made by [name], RPH-CPP since 07/03/2021 12:00 AM     Problem: Obesity, Chronic Pain, Migraines, Depression      Long-Range Goal: Disease Progression Prevention   Start Date: 03/28/2021  This Visit's Progress: On track  Recent Progress: On track  Priority: High  Note:   Current Barriers:  Unable to achieve control of weight  Complex patient with multiple comorbidities at a high risk for exacerbation and hospitalization  Pharmacist Clinical Goal(s):  Over the next 90 days, patient will achieve improvement of weight through collaboration with PharmD and provider.   Interventions: 1:1 collaboration with [name], MD regarding development and update of comprehensive plan of care as evidenced by provider attestation and co-signature Inter-disciplinary care team collaboration (see longitudinal plan of care) Comprehensive medication review performed; medication list updated in electronic medical record  SDOH: No medication cost concerns, Medicare + Medicaid   Hyperglycemia, Obesity: Unable to achieve weight loss through diet and exercise alone; current regimen: Ozempic 1 mg weekly Reports continue decrease in appetite, but denies diarrhea, constipation, nausea, or vomiting.  Baseline weight: 292 lbs; most recent home weight: 275 lbs lbs; total weight loss thus far 17 lbs (5% loss from baseline) Hx  bariatric clinic, but decided not to proceed with surgery - benefited from the nutrition class, but husband did not want her to pursue surgery Hx bupropion, ineffective for mental health/weight management Hx topiramate, intolerable side effects of GI upset, CNS depression Hx phentermine years ago  Current meal patterns: breakfast: oatmeal, sometimes poptart; sometimes bowl of cereal; lunch/supper: eggs, salad; tacos, homemade chicken pie, leftovers;  Current exercise: limited by chronic pain.  Extensive dietary education. Suggested choosing lower  calorie/carbohydrate options for breakfast rather than quick high carb option. Discussed continued focus on appropriate portion sizes, focus on lean proteins, fruits, vegetables, fiber intake and hydration.  Patient would like to increase. Increase Ozempic to 2 mg weekly.   Hyperlipidemia: Controlled per last lipid panel; current treatment: atorvastatin 10 mg daily  Due for refill. Sent today.  Recommended to continue current regimen at this time  Depression/Anxiety Uncontrolled; current treatment: sertraline 200 mg daily, methylphenidate 10 mg daily - though patient has not picked up yet; follows w/ Dr. Doyne Keel Per documentation, hx: citalopram, escitalopram, fluoxetine, venlafaxine, duloxetine, hydroxyzine, lithium, quetiapine, hydroxyzine, [facility] evaluation. Note to consider treatment for ADHD.  Recommend to continue current reigmen along with psychiatry collaboration. Discussed impact of stimulants  Chronic Pain: Fluctuating control; current regimen: oxycodone 10 mg 5 times daily, gabapentin 400 mg TID, ibuprofen 800 mg TID PRN, acetaminophen [phone] mg PRN, methocarbamol 500 mg 2-3 times daily PRN; follows with [facility]  Miralax PRN Has script for naloxone at home. In date per Pain Clinic documentation Previously recommended to continue current regimen at this time along with neurology collaboration   Migraines, Cognitive Dysfunction: Moderately well controlled; current regimen: Ajovy 140 mg Q28 days, rizatriptan 10 mg PRN migraines, Ubrelvy 100 mg PRN migraines; zonisamide 100-200 mg noted to add by Dr. Manuella Ghazi at last visit.  Previously recommended to continue current regimen at this time along with neurology collaboration  Dermatologic Conditions - Warts and Dermatitis: Uncontrolled; current regimen: tacrolimus 0.1% ointment BID to hands, continue clobetasol 0.05% ointment BID on weekends and PRN; follows w/ Dr. Laurence Ferrari Previously recommended to continue  current regimen at this time  GERD, Indigestion: Moderately well controlled; current regimen: esomeprazole 40 mg PRN Previously recommended to continue current regimen at this time  Patient Goals/Self-Care Activities Over the next 90 days, patient will:  - take medications as prescribed collaborate with provider on medication access solutions engage in dietary modifications by moderating portion sizes  Follow Up Plan: Telephone follow up appointment with care management team member scheduled for: ~ 8 weeks     Medication Assistance: None required.  Patient affirms current coverage meets needs.  Patient's preferred pharmacy is:  [facility] [address] [place] [address] [place] [address] Phone: [phone] Fax: [place], [place] [address] [place] [address] [place] [address] Phone: [phone] Fax: [phone]    Follow Up:  Patient agrees to Care Plan and Follow-up.  Plan: Telephone follow up appointment with care management team member scheduled for:  ~ 8 weeks  Catie Darnelle Maffucci, PharmD, [place], [place] Clinical Pharmacist [employer] [facility] [employer] [phone]

## 2021-07-12 ENCOUNTER — Other Ambulatory Visit: Payer: Self-pay | Admitting: Internal Medicine

## 2021-07-17 ENCOUNTER — Ambulatory Visit (INDEPENDENT_AMBULATORY_CARE_PROVIDER_SITE_OTHER): Payer: Medicare HMO | Admitting: Internal Medicine

## 2021-07-17 ENCOUNTER — Other Ambulatory Visit: Payer: Self-pay

## 2021-07-17 VITALS — BP 136/72 | HR 78 | Temp 97.8°F | Resp 16 | Ht 71.0 in | Wt 269.0 lb

## 2021-07-17 DIAGNOSIS — E7800 Pure hypercholesterolemia, unspecified: Secondary | ICD-10-CM | POA: Diagnosis not present

## 2021-07-17 DIAGNOSIS — G43809 Other migraine, not intractable, without status migrainosus: Secondary | ICD-10-CM | POA: Diagnosis not present

## 2021-07-17 DIAGNOSIS — Z96641 Presence of right artificial hip joint: Secondary | ICD-10-CM

## 2021-07-17 DIAGNOSIS — G8929 Other chronic pain: Secondary | ICD-10-CM

## 2021-07-17 DIAGNOSIS — M7989 Other specified soft tissue disorders: Secondary | ICD-10-CM | POA: Diagnosis not present

## 2021-07-17 DIAGNOSIS — Z0000 Encounter for general adult medical examination without abnormal findings: Secondary | ICD-10-CM

## 2021-07-17 DIAGNOSIS — M545 Low back pain, unspecified: Secondary | ICD-10-CM

## 2021-07-17 DIAGNOSIS — D649 Anemia, unspecified: Secondary | ICD-10-CM

## 2021-07-17 DIAGNOSIS — R42 Dizziness and giddiness: Secondary | ICD-10-CM

## 2021-07-17 DIAGNOSIS — F325 Major depressive disorder, single episode, in full remission: Secondary | ICD-10-CM | POA: Diagnosis not present

## 2021-07-17 DIAGNOSIS — R739 Hyperglycemia, unspecified: Secondary | ICD-10-CM | POA: Diagnosis not present

## 2021-07-17 DIAGNOSIS — G4733 Obstructive sleep apnea (adult) (pediatric): Secondary | ICD-10-CM

## 2021-07-17 LAB — BASIC METABOLIC PANEL
BUN: 12 mg/dL (ref 6–23)
CO2: 30 mEq/L (ref 19–32)
Calcium: 9.6 mg/dL (ref 8.4–10.5)
Chloride: 100 mEq/L (ref 96–112)
Creatinine, Ser: 0.81 mg/dL (ref 0.40–1.20)
GFR: 86.06 mL/min (ref >60.00)
Glucose, Bld: 78 mg/dL (ref 70–99)
Potassium: 4.5 mEq/L (ref 3.5–5.1)
Sodium: 138 mEq/L (ref 135–145)

## 2021-07-17 LAB — LIPID PANEL
Cholesterol: 264 mg/dL — ABNORMAL HIGH (ref 0–200)
HDL: 45.5 mg/dL (ref >39.00)
LDL Cholesterol: 181 mg/dL — ABNORMAL HIGH (ref 0–99)
NonHDL: 218.38
Total CHOL/HDL Ratio: 6
Triglycerides: 185 mg/dL — ABNORMAL HIGH (ref 0.0–149.0)
VLDL: 37 mg/dL (ref 0.0–40.0)

## 2021-07-17 LAB — CBC WITH DIFFERENTIAL/PLATELET
Basophils Absolute: 0 10*3/uL (ref 0.0–0.1)
Basophils Relative: 0.5 % (ref 0.0–3.0)
Eosinophils Absolute: 0.1 10*3/uL (ref 0.0–0.7)
Eosinophils Relative: 1.1 % (ref 0.0–5.0)
HCT: 41.3 % (ref 36.0–46.0)
Hemoglobin: 13.5 g/dL (ref 12.0–15.0)
Lymphocytes Relative: 25.6 % (ref 12.0–46.0)
Lymphs Abs: 1.4 10*3/uL (ref 0.7–4.0)
MCHC: 32.8 g/dL (ref 30.0–36.0)
MCV: 86.3 fl (ref 78.0–100.0)
Monocytes Absolute: 0.4 10*3/uL (ref 0.1–1.0)
Monocytes Relative: 7 % (ref 3.0–12.0)
Neutro Abs: 3.5 10*3/uL (ref 1.4–7.7)
Neutrophils Relative %: 65.8 % (ref 43.0–77.0)
Platelets: 233 10*3/uL (ref 150.0–400.0)
RBC: 4.79 Mil/uL (ref 3.87–5.11)
RDW: 14.2 % (ref 11.5–15.5)
WBC: 5.4 10*3/uL (ref 4.0–10.5)

## 2021-07-17 LAB — HEPATIC FUNCTION PANEL
ALT: 17 U/L (ref 0–35)
AST: 16 U/L (ref 0–37)
Albumin: 4.6 g/dL (ref 3.5–5.2)
Alkaline Phosphatase: 102 U/L (ref 39–117)
Bilirubin, Direct: 0.1 mg/dL (ref 0.0–0.3)
Total Bilirubin: 0.5 mg/dL (ref 0.2–1.2)
Total Protein: 7.1 g/dL (ref 6.0–8.3)

## 2021-07-17 LAB — HEMOGLOBIN A1C: Hgb A1c MFr Bld: 5.5 % (ref 4.6–6.5)

## 2021-07-17 LAB — TSH: TSH: 1.59 u[IU]/mL (ref 0.35–5.50)

## 2021-07-17 NOTE — Assessment & Plan Note (Signed)
Physical today 07/17/21.  PAP 06/24/19 - negative with negative HPV.  Colonoscopy 03/19/21 - internal hemorhoids.  Recommended f/u in 5 years.  Mammogram 04/26/21 - Birads I.

## 2021-07-17 NOTE — Progress Notes (Signed)
Patient ID: Kathleen Everett, female   DOB: [birth year], 50 y.o.   MRN: [id number]   Subjective:    Patient ID: Kathleen Everett, female    DOB: 10-02-1971, 51 y.o.   MRN: [id number]  This visit occurred during the SARS-CoV-2 public health emergency.  Safety protocols were in place, including screening questions prior to the visit, additional usage of staff PPE, and extensive cleaning of exam room while observing appropriate contact time as indicated for disinfecting solutions.   Patient here for her physical exam.   Chief Complaint  Patient presents with   Annual Exam   .   HPI Reports she is doing relatively well. Currently on ozempic.  Has lost weight.  Limited exercise due to chronic pain.  She has been seeing neurology for migraine treatment and has history of chiari malformation s/p occipital craniotomy for chiari decompression.  Currently:  ubrelvy prn, rizatriptan prn and monthly ajovy injections.  Recent appt 04/10/21 - recommended starting zonisamide.  Has intermittent headaches.  Also has noticed dizziness for several months.  Has a history of vertigo and some nausea.  Some right ear pressure.  Notices ringing in her ears.  No significant sinus pressure.  Runny nose.  No chest pain.  Breathing stable.  No increased cough or congestion.  No acid reflux.  No abdominal pain.  Bowels moving.  PAP 2020.  Cracked/fissure - right foot.  Recently saw ortho for evlauation mass - right anterior mid thigh.  Recommended MRI - right femur.     Past Medical History:  Diagnosis Date   Abdominal pain 07/24/2014   Abscess of right hip 05/11/2020   Anemia 01/02/2020   Ankle pain, right 04/24/2014   Breast pain, right 09/08/2014   Chest pain 02/15/2015   Chiari malformation type I 01/26/2014   s/p sgy decompression   Chronic back pain 12/13/2012   Chronic gastritis 07/18/2015   Chronic tension-type headache, intractable 04/24/2015   DDD (degenerative disc disease), lumbar    Diverticulitis     Dysmenorrhea    Dysplastic nevus 10/09/2015   R abdomen - moderate, limited margins Everett.   Endometriosis    Generalized anxiety disorder 05/01/2015   Has previously seen psychiatry. Having some issues with what she feels is related to stress and anxiety   GERD (gastroesophageal reflux disease)    Hemorrhoids 12/23/2020   Hidradenitis    History of ovarian cyst    Hot flashes 03/12/2015   Hypercholesteremia 01/10/2014   Off lipitor. Elevated cholesterol. Labs discussed. Restart lipitor. Follow lipid panel and liver function tests.   Hyperglycemia 06/24/2019   Major depressive disorder 05/01/2015   Migraine headaches 01/10/2014   Has seen neurology. Topamax.   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 Level is 21.6   Past Surgical History:  Procedure Laterality Date   ABDOMINAL HYSTERECTOMY  03/27/07   laparoscopic   BREAST EXCISIONAL BIOPSY Left 2004   fibroadenoma   CESAREAN SECTION  1999/2004 twins   chiari decompression     COLONOSCOPY WITH PROPOFOL N/A 04/28/2015   Procedure: COLONOSCOPY WITH PROPOFOL;  Surgeon: Manya Silvas, MD;  Location: [facility] ENDOSCOPY;  Service: Endoscopy;  Laterality: N/A;   COLONOSCOPY WITH PROPOFOL N/A 03/19/2021   Procedure: COLONOSCOPY WITH PROPOFOL;  Surgeon: Lesly Rubenstein, MD;  Location: [facility] ENDOSCOPY;  Service: Endoscopy;  Laterality: N/A;   CYSTOSCOPY     bladder ulcers   ESOPHAGOGASTRODUODENOSCOPY N/A 04/28/2015   Procedure: ESOPHAGOGASTRODUODENOSCOPY (EGD);  Surgeon: Manya Silvas, MD;  Location: [facility] ENDOSCOPY;  Service: Endoscopy;  Laterality: N/A;   ESOPHAGOGASTRODUODENOSCOPY (EGD) WITH PROPOFOL N/A 03/19/2021   Procedure: ESOPHAGOGASTRODUODENOSCOPY (EGD) WITH PROPOFOL;  Surgeon: Lesly Rubenstein, MD;  Location: [facility] ENDOSCOPY;  Service: Endoscopy;  Laterality: N/A;   HERNIA REPAIR Right 2003   inguinal   INCISION AND DRAINAGE HIP Right 05/11/2020   Procedure: IRRIGATION AND DEBRIDEMENT HIP;  Surgeon: Hessie Knows, MD;  Location: [facility] ORS;  Service: Orthopedics;  Laterality: Right;   OVARIAN CYST REMOVAL  2001   right   TEMPOROMANDIBULAR JOINT SURGERY  2000   TOTAL HIP ARTHROPLASTY Right 12/28/2019   Procedure: TOTAL HIP ARTHROPLASTY ANTERIOR APPROACH;  Surgeon: Hessie Knows, MD;  Location: [facility] ORS;  Service: Orthopedics;  Laterality: Right;   TUBAL LIGATION     Bilateral   Family History  Problem Relation Age of Onset   Hyperlipidemia Mother    Anxiety disorder Mother    Atrial fibrillation Mother    Hypertension Mother    Memory loss Mother    Anxiety disorder Father    Stroke Father    Hyperlipidemia Father    Hypertension Father    Congestive Heart Failure Father    Depression Father    Hyperlipidemia Brother    Colon cancer Paternal Aunt    Dementia Paternal Aunt    Dementia Maternal Grandmother    Dementia Paternal Grandmother    ADD / ADHD Daughter    Breast cancer Neg Hx    Social History   Socioeconomic History   Marital status: Married    Spouse name: Not on file   Number of children: 3   Years of education: 13   Highest education level: Some college, no degree  Occupational History   Occupation: Disability  Tobacco Use   Smoking status: Never   Smokeless tobacco: Never  Vaping Use   Vaping Use: Never used  Substance and Sexual Activity   Alcohol use: Not Currently    Alcohol/week: 0.0 standard drinks   Drug use: No   Sexual activity: Yes    Birth control/protection: None  Other Topics Concern   Not on file   Social History Narrative   Patient drinks 2-3 cups of caffeine daily.   Patient is right handed.       Social Hx:   Current living situation- [place]. Living with husband and 3 kids   Born and Raised- [place].raised by both parents.    Siblings- 1 brother who is 7 yrs older than her   Legal issues- none   Married for 24 yrs. Currently unemployed since 2011 when she hurt her back. HS grad and CNA and phlebotomy.    Social Determinants of Health   Financial Resource Strain: Low Risk    Difficulty of Paying Living Expenses: Not hard at all  Food Insecurity: Not on file  Transportation Needs: Not on file  Physical Activity: Not on file  Stress: Not on file  Social Connections: Not on file  Review of Systems  Constitutional:  Negative for fever.       Has lost weight.    HENT:  Negative for congestion, sinus pressure and sore throat.   Eyes:  Negative for pain and visual disturbance.  Respiratory:  Negative for cough, chest tightness and shortness of breath.   Cardiovascular:  Negative for chest pain, palpitations and leg swelling.  Gastrointestinal:  Positive for nausea. Negative for abdominal pain, diarrhea and vomiting.  Genitourinary:  Negative for difficulty urinating and dysuria.  Musculoskeletal:  Positive for back pain. Negative for joint swelling and myalgias.  Skin:  Negative for color change and rash.  Neurological:        Intermittent headaches and dizziness as outlined.    Hematological:  Negative for adenopathy. Does not bruise/bleed easily.  Psychiatric/Behavioral:  Negative for decreased concentration and dysphoric mood.       Objective:     BP 136/72   Pulse 78   Temp 97.8 F (36.6 C)   Resp 16   Ht _0  (1.803 m)   Wt 269 lb (122 kg)   LMP 12/08/2006   SpO2 98%   BMI 37.52 kg/m  Wt Readings from Last 3 Encounters:  07/17/21 269 lb (122 kg)  07/03/21 275 lb (124.7 kg)  05/30/21 284 lb 6.4 oz (129 kg)    Physical Exam Vitals reviewed.   Constitutional:      General: She is not in acute distress.    Appearance: Normal appearance. She is well-developed.  HENT:     Head: Normocephalic and atraumatic.     Right Ear: External ear normal.     Left Ear: External ear normal.  Eyes:     General: No scleral icterus.       Right eye: No discharge.        Left eye: No discharge.     Conjunctiva/sclera: Conjunctivae normal.  Neck:     Thyroid: No thyromegaly.  Cardiovascular:     Rate and Rhythm: Normal rate and regular rhythm.  Pulmonary:     Effort: No tachypnea, accessory muscle usage or respiratory distress.     Breath sounds: Normal breath sounds. No decreased breath sounds or wheezing.  Chest:  Breasts:    Right: No inverted nipple, mass, nipple discharge or tenderness (no axillary adenopathy).     Left: No inverted nipple, mass, nipple discharge or tenderness (no axilarry adenopathy).  Abdominal:     General: Bowel sounds are normal.     Palpations: Abdomen is soft.     Tenderness: There is no abdominal tenderness.  Musculoskeletal:        General: No swelling or tenderness.     Cervical back: Neck supple.  Lymphadenopathy:     Cervical: No cervical adenopathy.  Skin:    Findings: No erythema or rash.  Neurological:     Mental Status: She is alert and oriented to person, place, and time.  Psychiatric:        Mood and Affect: Mood normal.        Behavior: Behavior normal.     Outpatient Encounter Medications as of 07/17/2021  Medication Sig   acetaminophen (TYLENOL) 500 MG tablet Take 500-1,000 mg by mouth 2 (two) times daily as needed for mild pain.    AIMOVIG 140 MG/ML SOAJ Inject 140 mg into the skin every 28 (twenty-eight) days.   atorvastatin (LIPITOR) 10 MG tablet Take 1 tablet (10 mg total) by mouth daily.   cyanocobalamin (,VITAMIN B-12,) 1000 MCG/ML  injection INJECT 1 ML INTRAMUSCULARLY ONCE EVERY MONTH   diclofenac sodium (VOLTAREN) 1 % GEL Apply 1 application topically 4 (four) times daily as  needed (pain.).    esomeprazole (NEXIUM) 40 MG capsule Take 1 capsule by mouth once daily   estradiol (ESTRACE VAGINAL) 0.1 MG/GM vaginal cream One applicator q hs x 5 nights and then 2x/week. (Patient taking differently: Place 1 Applicatorful vaginally once a week.)   gabapentin (NEURONTIN) 400 MG capsule Take 400 mg by mouth 3 (three) times daily.   ibuprofen (ADVIL) 800 MG tablet Take 800 mg by mouth 3 (three) times daily as needed (Pain).    lactase (RA DAIRY AID) 3000 units tablet Take 9,000 Units by mouth 3 (three) times daily as needed (with dairy consumption).    methocarbamol (ROBAXIN) 500 MG tablet Take 500 mg by mouth 4 (four) times daily.   methylphenidate (RITALIN) 10 MG tablet Take 1 tablet (10 mg total) by mouth daily.   methylphenidate (RITALIN) 10 MG tablet Take 1 tablet (10 mg total) by mouth daily.   mupirocin ointment (BACTROBAN) 2 % Apply 1 application topically daily.   naloxone [facility]) 2 MG/2ML injection Inject into the vein as needed.   nystatin cream (MYCOSTATIN) APPLY TOPICALLY TWICE DAILY   Oxycodone HCl 10 MG TABS Take 10 mg by mouth 5 (five) times daily.    rizatriptan (MAXALT) 10 MG tablet Take 10 mg by mouth as needed for migraine. May repeat in 2 hours if needed   Semaglutide, 2 MG/DOSE, 8 MG/3ML SOPN Inject 2 mg as directed once a week.   sertraline (ZOLOFT) 100 MG tablet Take 2 tablets (200 mg total) by mouth daily.   Simethicone (GAS RELIEF PO) Take 125 mg by mouth 2 (two) times daily as needed for flatulence. Anti gas   tacrolimus (PROTOPIC) 0.1 % ointment Apply topically 2 (two) times daily.   triamcinolone cream (KENALOG) 0.1 % APPLY  CREAM EXTERNALLY TWICE DAILY TO AFFECTED AREA ON  FINGERS   Ubrogepant (UBRELVY) 100 MG TABS Take 100 mg by mouth 2 (two) times daily as needed.   [DISCONTINUED] Ascorbic Acid (VITAMIN C) 500 MG CHEW Chew 500 mg by mouth daily.   [DISCONTINUED] augmented betamethasone dipropionate (DIPROLENE-AF) 0.05 % cream Apply to hands  twice a day as needed. Avoid applying to face, groin, and axilla. Use as directed. (Patient not taking: Reported on 06/28/2021)   [DISCONTINUED] fluorouracil (EFUDEX) 5 % cream Apply topically in the morning and at bedtime. To affected areas at hands (Patient not taking: Reported on 06/28/2021)   [DISCONTINUED] furosemide (LASIX) 20 MG tablet Take 20 mg by mouth as needed. (Patient not taking: Reported on 06/28/2021)   [DISCONTINUED] NON FORMULARY Take 2 capsules by mouth daily. Thrive vitamins with shake and derma patch (Patient not taking: Reported on 06/28/2021)   [DISCONTINUED] potassium chloride (KLOR-CON) 10 MEQ tablet Take 1 tablet (10 mEq total) by mouth daily as needed (As needed with Furosemide (Lasix)).   No facility-administered encounter medications on file as of 07/17/2021.     Lab Results  Component Value Date   WBC 5.4 07/17/2021   HGB 13.5 07/17/2021   HCT 41.3 07/17/2021   PLT 233.0 07/17/2021   GLUCOSE 78 07/17/2021   CHOL 264 (H) 07/17/2021   TRIG 185.0 (H) 07/17/2021   HDL 45.50 07/17/2021   LDLDIRECT 181.0 06/26/2020   LDLCALC 181 (H) 07/17/2021   ALT 17 07/17/2021   AST 16 07/17/2021   NA 138 07/17/2021   K 4.5 07/17/2021  CL 100 07/17/2021   CREATININE 0.81 07/17/2021   BUN 12 07/17/2021   CO2 30 07/17/2021   TSH 1.59 07/17/2021   INR 1.1 12/03/2019   HGBA1C 5.5 07/17/2021    MR FEMUR RIGHT W WO CONTRAST  Result Date: 04/30/2021 CLINICAL DATA:  Pain, swelling, and numbness of the anterior right upper thigh be low surgical incision site. History of right hip arthroplasty in February of 2021 EXAM: MRI OF THE RIGHT FEMUR WITHOUT AND WITH CONTRAST TECHNIQUE: Multiplanar, multisequence MR imaging of the right thigh was performed both before and after administration of intravenous contrast. Field-of-view extends from the level of the right hip joint to the distal femoral diaphysis. CONTRAST:  42m MULTIHANCE GADOBENATE DIMEGLUMINE 529 MG/ML IV SOLN COMPARISON:  None  FINDINGS: Bones/Joint/Cartilage Susceptibility artifact related to right hip arthroplasty hardware. Remaining visualized osseous structures appear within normal limits. No evidence of fracture. No bone marrow edema. No suspicious marrow replacing bone lesion. No periprosthetic fluid collection is seen. Ligaments Not well evaluated secondary to artifact. Muscles and Tendons Musculotendinous structures appear within normal limits. No evidence of acute tendinous injury. No intramuscular edema or fluid collection. No intramuscular mass. Soft tissues Healed surgical incision site at the anterior aspect of the right hip/proximal thigh. No associated fluid collections. Inferior to site of surgical scar are markers placed on the skin surface indicating site of patient's clinical concern. No underlying abnormality is identified within this area. No edema or fluid collection. No solid or cystic mass. No abnormal postcontrast enhancement. No right inguinal lymphadenopathy. IMPRESSION: 1. No abnormality identified within the soft tissues of the anterior right thigh at site of patient's clinical concern. 2. Status post right total hip arthroplasty. No acute osseous findings. Electronically Signed   By: NDavina PokeD.O.   On: 04/30/2021 17:07       Assessment & Plan:   Problem List Items Addressed This Visit     Anemia    Follow cbc.       Relevant Orders   CBC with Differential/Platelet (Completed)   Basic metabolic panel (Completed)   TSH (Completed)   Chronic back pain    Followed by pain clinic.       Depression, major, in remission ([facility]    Seeing psychiatry.  Overall appears to be doing well on current medication regimen.       Dizziness    Persistent intermittent issue for several months.  Headaches appear to be stable.  Has a history of vertigo.  Reports some intermittent pressure right ear.  Persistent tinnitus.  Steroid nasal spray as directed.  Have ENT evaluate.       Relevant Orders    Ambulatory referral to ENT   Healthcare maintenance    Physical today 07/17/21.  PAP 06/24/19 - negative with negative HPV.  Colonoscopy 03/19/21 - internal hemorhoids.  Recommended f/u in 5 years.  Mammogram 04/26/21 - Birads I.       Hypercholesteremia    On lipitor.  Low cholesterol diet and exercise.  Follow lipid panel and liver function tests.        Hyperglycemia    Low carb diet and exercise.  Follow met b and a1c.       Relevant Orders   Hemoglobin A1c (Completed)   Migraine headaches    Followed by neurology.  Intermittent headaches.  Medication regimen as outlined.  Follow.        Obstructive sleep apnea    Needs to use cpap regularly.  Soft tissue mass    Soft tissue mass - right anterior mid thigh.  Ortho recommended MRI.  Has not had.  Needs f/u with ortho regarding scheduling.       Status post total hip replacement, right    Followed by ortho.       Other Visit Diagnoses     Routine general medical examination at a health care facility    -  Primary   Hypercholesterolemia       Relevant Orders   Lipid panel (Completed)   Hepatic function panel (Completed)        [name], MD

## 2021-07-20 ENCOUNTER — Telehealth: Payer: Self-pay | Admitting: Internal Medicine

## 2021-07-20 ENCOUNTER — Ambulatory Visit: Payer: Medicare HMO | Admitting: Pharmacist

## 2021-07-20 DIAGNOSIS — R739 Hyperglycemia, unspecified: Secondary | ICD-10-CM

## 2021-07-20 DIAGNOSIS — E7800 Pure hypercholesterolemia, unspecified: Secondary | ICD-10-CM

## 2021-07-20 NOTE — Patient Instructions (Signed)
Visit Information  PATIENT GOALS:  Goals Addressed               This Visit's Progress     Patient Stated     Medication Monitoring (pt-stated)        Patient Goals/Self-Care Activities Over the next 90 days, patient will:  - take medications as prescribed collaborate with provider on medication access solutions engage in dietary modifications by moderating portion sizes         Patient verbalizes understanding of instructions provided today and agrees to view in [place].   Plan: Telephone follow up appointment with care management team member scheduled for:  ~8 weeks  Catie Darnelle Maffucci, PharmD, [place], [place] Clinical Pharmacist [employer] [facility] [employer] [phone]

## 2021-07-20 NOTE — Telephone Encounter (Signed)
Patient would like to speak to Catie about her Ozempic. The pharmacy doesn't have her dosage but has other dosages.

## 2021-07-20 NOTE — Telephone Encounter (Signed)
**Note Kathleen-Identified via Obfuscation** Patient reports that her pharmacy was unable to fill Ozempic 2 mg due to backorder.   Medication Samples have been provided to the patient.  Drug name: Ozempic 2 mg       Strength: 8 mg/3 mL        Qty: 1 pen  LOT: [id number]  Exp.Date: 05/17/22  Dosing instructions: Inject 2 mg weekly  The patient has been instructed regarding the correct time, dose, and frequency of taking this medication, including desired effects and most common side effects.   Kathleen Everett 3:46 PM 07/20/2021

## 2021-07-20 NOTE — Chronic Care Management (AMB) (Signed)
Chronic Care Management Pharmacy Note  07/20/2021 Name:  Kathleen Everett MRN:  [id number] DOB:  30-Sep-1971   Subjective: Kathleen Everett is an 50 y.o. year old female who is a primary patient of [name], MD.  The CCM team was consulted for assistance with disease management and care coordination needs.    Engaged with patient by telephone for  medication access  in response to provider referral for pharmacy case management and/or care coordination services.   Consent to Services:  The patient was given information about Chronic Care Management services, agreed to services, and gave verbal consent prior to initiation of services.  Please see initial visit note for detailed documentation.   Patient Care Team: [name], MD as PCP - General (Internal Medicine) [name], MD as Referring Physician (Neurology) [name], MD as Consulting Physician (Cardiology) [name], RPH-CPP (Pharmacist)  Objective:  Lab Results  Component Value Date   CREATININE 0.81 07/17/2021   CREATININE 0.74 03/01/2021   CREATININE 0.95 06/26/2020    Lab Results  Component Value Date   HGBA1C 5.5 07/17/2021   Last diabetic Eye exam: No results found for: HMDIABEYEEXA  Last diabetic Foot exam: No results found for: HMDIABFOOTEX      Component Value Date/Time   CHOL 264 (H) 07/17/2021 0920   CHOL 235 (H) 03/19/2012 0445   TRIG 185.0 (H) 07/17/2021 0920   TRIG 258 (H) 03/19/2012 0445   HDL 45.50 07/17/2021 0920   HDL 35 (L) 03/19/2012 0445   CHOLHDL 6 07/17/2021 0920   VLDL 37.0 07/17/2021 0920   VLDL 52 (H) 03/19/2012 0445   LDLCALC 181 (H) 07/17/2021 0920   LDLCALC 148 (H) 03/19/2012 0445   LDLDIRECT 181.0 06/26/2020 0855    Hepatic Function Latest Ref Rng & Units 07/17/2021 03/01/2021 06/26/2020  Total Protein 6.0 - 8.3 g/dL 7.1 6.8 6.9  Albumin 3.5 - 5.2 g/dL 4.6 4.2 4.3  AST 0 - 37 U/L _0 ALT 0 - 35 U/L _1 Alk Phosphatase 39 -  117 U/L 102 113 110  Total Bilirubin 0.2 - 1.2 mg/dL 0.5 0.4 0.3  Bilirubin, Direct 0.0 - 0.3 mg/dL 0.1 0.1 0.1    Lab Results  Component Value Date/Time   TSH 1.59 07/17/2021 09:20 AM   TSH 3.44 06/26/2020 08:55 AM    CBC Latest Ref Rng & Units 07/17/2021 06/26/2020 05/11/2020  WBC 4.0 - 10.5 K/uL 5.4 5.7 8.7  Hemoglobin 12.0 - 15.0 g/dL 13.5 12.7 11.9(L)  Hematocrit 36.0 - 46.0 % 41.3 38.5 36.4  Platelets 150.0 - 400.0 K/uL 233.0 248.0 276    Lab Results  Component Value Date/Time   VD25OH 23.27 (L) 06/18/2018 09:18 AM   VD25OH 28.42 (L) 08/01/2016 08:17 AM    Clinical ASCVD: No  The 10-year ASCVD risk score Mikey Bussing DC Jr., et al., 2013) is: 2.3%   Values used to calculate the score:     Age: 31 years     Sex: Female     Is Non-Hispanic African American: No     Diabetic: No     Tobacco smoker: No     Systolic Blood Pressure: 709 mmHg     Is BP treated: No     HDL Cholesterol: 45.5 mg/dL     Total Cholesterol: 264 mg/dL     Social History   Tobacco Use  Smoking Status Never  Smokeless Tobacco Never   BP Readings from Last 3  Encounters:  07/17/21 136/72  03/19/21 122/71  03/17/21 127/74   Pulse Readings from Last 3 Encounters:  07/17/21 78  03/19/21 63  03/17/21 71   Wt Readings from Last 3 Encounters:  07/17/21 269 lb (122 kg)  07/03/21 275 lb (124.7 kg)  05/30/21 284 lb 6.4 oz (129 kg)    Assessment: Review of patient past medical history, allergies, medications, health status, including review of consultants reports, laboratory and other test data, was performed as part of comprehensive evaluation and provision of chronic care management services.   SDOH:  (Social Determinants of Health) assessments and interventions performed:  SDOH Interventions    Flowsheet Row Most Recent Value  SDOH Interventions   Financial Strain Interventions Intervention Not Indicated       CCM Care Plan  Allergies  Allergen Reactions   Vancomycin Hives   Ceftin  [Cefuroxime Axetil] Other (See Comments)    Upset stomach   Celexa [Citalopram Hydrobromide] Diarrhea    GI upset     Clindamycin/Lincomycin Rash   Penicillins Rash    Did it involve swelling of the face/tongue/throat, SOB, or low BP? No Did it involve sudden or severe rash/hives, skin peeling, or any reaction on the inside of your mouth or nose? Unknown Did you need to seek medical attention at a hospital or doctor's office? No When did it last happen? childhood reaction If all above answers are "NO", may proceed with cephalosporin use.    Sulfa Antibiotics Rash    Medications Reviewed Today     Reviewed by Charlcie Cradle, MD (Psychiatrist) on 06/28/21 at 25  Med List Status: <None>   Medication Order Taking? Sig Documenting Provider Last Dose Status Informant  acetaminophen (TYLENOL) 500 MG tablet [id number] Yes Take 500-1,000 mg by mouth 2 (two) times daily as needed for mild pain.  [provider] Taking Active Self           Med Note Darnelle Maffucci, Arville Lime   Wed Mar 28, 2021  1:49 PM) Taking at least 1-2 times daily  AIMOVIG 140 MG/ML SOAJ [id number] Yes Inject 140 mg into the skin every 28 (twenty-eight) days. [provider] Taking Active   Ascorbic Acid (VITAMIN C) 500 MG CHEW [id number] Yes Chew 500 mg by mouth daily. [provider] Taking Active            Med Note Darnelle Maffucci, Arville Lime   Wed Mar 28, 2021  1:50 PM)    atorvastatin (LIPITOR) 10 MG tablet [id number] Yes Take 1 tablet by mouth once daily [name], MD Taking Active   augmented betamethasone dipropionate (DIPROLENE-AF) 0.05 % cream [id number] No Apply to hands twice a day as needed. Avoid applying to face, groin, and axilla. Use as directed.  Patient not taking: Reported on 06/28/2021   [facility], [place], MD Not Taking Active   cyanocobalamin (,VITAMIN B-12,) 1000 MCG/ML injection [id number] Yes INJECT ONE ML (CC) INTRAMUSCULARLY ONCE EVERY MONTH  Patient taking differently: Inject 1,000  mcg into the muscle every 30 (thirty) days.   [name], MD Taking Active            Med Note [facility], BRANDY L   Mon Mar 13, 2020  8:32 AM)    diclofenac sodium (VOLTAREN) 1 % GEL [id number] Yes Apply 1 application topically 4 (four) times daily as needed (pain.).  [provider] Taking Active Self           Med Note Darnelle Maffucci, Arville Lime   Wed Mar 28, 2021  1:53 PM) PRN  esomeprazole (NEXIUM) 40 MG capsule [id number] Yes Take 1 capsule by mouth once daily [name], MD Taking Active   estradiol (ESTRACE VAGINAL) 0.1 MG/GM vaginal cream [id number] Yes One applicator q hs x 5 nights and then 2x/week.  Patient taking differently: Place 1 Applicatorful vaginally once a week.   [name], MD Taking Active   fluorouracil (EFUDEX) 5 % cream [id number] No Apply topically in the morning and at bedtime. To affected areas at hands  Patient not taking: Reported on 06/28/2021   [facility], [place], MD Not Taking Active   furosemide (LASIX) 20 MG tablet [id number] No Take 20 mg by mouth as needed.  Patient not taking: Reported on 06/28/2021   [provider] Not Taking Active   gabapentin (NEURONTIN) 400 MG capsule [id number] Yes Take 400 mg by mouth 3 (three) times daily. [provider] Taking Active Self  ibuprofen (ADVIL) 800 MG tablet [id number] Yes Take 800 mg by mouth 3 (three) times daily as needed (Pain).  [provider] Taking Active Self           Med Note Darnelle Maffucci, Arville Lime   Wed Mar 28, 2021  1:56 PM) PRN  lactase (RA DAIRY AID) 3000 units tablet [id number] Yes Take 9,000 Units by mouth 3 (three) times daily as needed (with dairy consumption).  [provider] Taking Active Self  methocarbamol (ROBAXIN) 500 MG tablet [id number] Yes Take 500 mg by mouth 4 (four) times daily. [provider] Taking Active            Med Note [name]   Wed Mar 28, 2021  1:57 PM) Using ~2-4 times daily  methylphenidate (RITALIN) 5  MG tablet [id number] Yes Take 1 tablet (5 mg total) by mouth daily. Charlcie Cradle, MD Taking Active   methylphenidate (RITALIN) 5 MG tablet [id number] Yes Take 1 tablet (5 mg total) by mouth daily. Charlcie Cradle, MD Taking Active   mupirocin ointment (BACTROBAN) 2 % [id number] Yes Apply 1 application topically daily. Moye, [place], MD Taking Active   naloxone [facility]) 2 MG/2ML injection [id number] Yes Inject into the vein as needed. [provider] Taking Active   NON FORMULARY [id number] No Take 2 capsules by mouth daily. Thrive vitamins with shake and derma patch  Patient not taking: Reported on 06/28/2021   [provider] Not Taking Active Self  nystatin cream (MYCOSTATIN) [id number] Yes APPLY TOPICALLY TWICE DAILY Crecencio Mc, MD Taking Active Self  Oxycodone HCl 10 MG TABS [id number] Yes Take 10 mg by mouth 5 (five) times daily.  [provider] Taking Active Self           Med Note Kenton Kingfisher, Kathrine Haddock Nov 30, 2019  8:20 AM)    potassium chloride (KLOR-CON) 10 MEQ tablet [id number]  Take 1 tablet (10 mEq total) by mouth daily as needed (As needed with Furosemide (Lasix)). [name], MD  Active Self  rizatriptan (MAXALT) 10 MG tablet [id number] Yes Take 10 mg by mouth as needed for migraine. May repeat in 2 hours if needed [provider] Taking Active Self           Med Note Gentry Roch   Wed May 10, 2020  9:00 AM)    Semaglutide, 1 MG/DOSE, (OZEMPIC, 1 MG/DOSE,) 4 MG/3ML SOPN [id number] Yes Inject 1 mg into the skin once a week. [name], MD Taking Active   sertraline (ZOLOFT) 100 MG tablet [id number] Yes Take  2 tablets (200 mg total) by mouth daily. Charlcie Cradle, MD Taking Active   Simethicone (GAS RELIEF PO) [id number] Yes Take 125 mg by mouth 2 (two) times daily as needed for flatulence. Anti gas [provider] Taking Active Self  tacrolimus (PROTOPIC) 0.1 % ointment [id number] Yes Apply topically 2 (two) times daily.  Moye, [place], MD Taking Active   triamcinolone cream (KENALOG) 0.1 % [id number] Yes APPLY  CREAM EXTERNALLY TWICE DAILY TO AFFECTED AREA ON  FINGERS [name], MD Taking Active   Ubrogepant (UBRELVY) 100 MG TABS [id number] Yes Take 100 mg by mouth 2 (two) times daily as needed. [provider] Taking Active             Patient Active Problem List   Diagnosis Date Noted   Vitamin B12 deficiency    Vaginal irritation 03/03/2021   Soft tissue mass 03/03/2021   Hemorrhoids 12/23/2020   DDD (degenerative disc disease), lumbar 10/03/2020   Abscess of right hip 05/11/2020   Healthcare maintenance 01/02/2020   Anemia 01/02/2020   Status post total hip replacement, 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   Hyperglycemia 06/24/2019   Vaginal bleeding 09/30/2016   Primary osteoarthritis of right hip 01/17/2016   Chronic gastritis 07/18/2015   Major depressive disorder 05/01/2015   Generalized anxiety disorder 05/01/2015   Chronic tension-type headache, intractable 04/24/2015   Dysmenorrhea 04/24/2015   Endometriosis 04/24/2015   Hidradenitis 04/24/2015   Hot flashes 03/12/2015   Chest pain 02/15/2015   BMI 40.0-44.9, adult ([place]) 10/16/2014   Breast pain, right 09/08/2014   Abdominal pain 07/24/2014   GERD (gastroesophageal reflux disease) 04/24/2014   Ankle pain, right 04/24/2014   Chiari malformation type I 01/26/2014   Vitamin D deficiency 01/18/2014   Diverticulitis 01/10/2014   Hypercholesteremia 01/10/2014   Right hip pain 01/10/2014   Migraine headaches 01/10/2014   Obstructive sleep apnea 01/10/2014   Chronic back pain 12/13/2012   Mitral valve prolapse 12/13/2012     There is no immunization history on file for this patient.  Conditions to be addressed/monitored: Obesity  Care Plan : Medication Management  Updates made by [name], RPH-CPP since 07/20/2021 12:00 AM     Problem: Obesity, Chronic Pain, Migraines, Depression      Long-Range Goal: Disease  Progression Prevention   Start Date: 03/28/2021  Recent Progress: On track  Priority: High  Note:   Current Barriers:  Unable to achieve control of weight  Complex patient with multiple comorbidities at a high risk for exacerbation and hospitalization  Pharmacist Clinical Goal(s):  Over the next 90 days, patient will achieve improvement of weight through collaboration with PharmD and provider.   Interventions: 1:1 collaboration with [name], MD regarding development and update of comprehensive plan of care as evidenced by provider attestation and co-signature Inter-disciplinary care team collaboration (see longitudinal plan of care) Comprehensive medication review performed; medication list updated in electronic medical record  SDOH: No medication cost concerns, Medicare + Medicaid   Hyperglycemia, Obesity: Unable to achieve weight loss through diet and exercise alone; current regimen: Ozempic 1 mg weekly - plan to increase to Ozempic 2 mg weekly Baseline weight: 292 lbs; most recent home weight: 275 lbs lbs; total weight loss thus far 17 lbs (5% loss from baseline) Hx bariatric clinic, but decided not to proceed with surgery - benefited from the nutrition class, but husband did not want her to pursue surgery Hx bupropion, ineffective for mental health/weight management Hx topiramate, intolerable side effects of GI upset, CNS depression  Hx phentermine years ago  Unable to fill Ozempic 2 mg at pharmacy. Preparing sample.   Hyperlipidemia: Controlled per last lipid panel; current treatment: atorvastatin 10 mg daily  Previously recommended to continue current regimen at this time  Depression/Anxiety Uncontrolled; current treatment: sertraline 200 mg daily, methylphenidate 10 mg daily - though patient has not picked up yet; follows w/ Dr. Doyne Keel Per documentation, hx: citalopram, escitalopram, fluoxetine, venlafaxine, duloxetine, hydroxyzine, lithium, quetiapine, hydroxyzine,  [facility] evaluation. Note to consider treatment for ADHD.  Previously recommended to continue current reigmen along with psychiatry collaboration. Discussed impact of stimulants  Chronic Pain: Fluctuating control; current regimen: oxycodone 10 mg 5 times daily, gabapentin 400 mg TID, ibuprofen 800 mg TID PRN, acetaminophen [id number] mg PRN, methocarbamol 500 mg 2-3 times daily PRN; follows with [facility]  Miralax PRN Has script for naloxone at home. In date per [facility] documentation Previously recommended to continue current regimen at this time along with neurology collaboration   Migraines, Cognitive Dysfunction: Moderately well controlled; current regimen: Ajovy 140 mg Q28 days, rizatriptan 10 mg PRN migraines, Ubrelvy 100 mg PRN migraines; zonisamide 100-200 mg noted to add by Dr. Manuella Ghazi at last visit.  Previously recommended to continue current regimen at this time along with neurology collaboration  Dermatologic Conditions - Warts and Dermatitis: Uncontrolled; current regimen: tacrolimus 0.1% ointment BID to hands, continue clobetasol 0.05% ointment BID on weekends and PRN; follows w/ Dr. Laurence Ferrari Previously recommended to continue current regimen at this time  GERD, Indigestion: Moderately well controlled; current regimen: esomeprazole 40 mg PRN Previously recommended to continue current regimen at this time  Patient Goals/Self-Care Activities Over the next 90 days, patient will:  - take medications as prescribed collaborate with provider on medication access solutions engage in dietary modifications by moderating portion sizes  Follow Up Plan: Telephone follow up appointment with care management team member scheduled for: ~ 8 weeks     Medication Assistance: None required.  Patient affirms current coverage meets needs.  Patient's preferred pharmacy is:  [facility] [address], [place] - [place] [address] [place]  [address] Phone: [phone] Fax: [place], [address] [place] [address] Phone: [phone] Fax: [phone]   Follow Up:  Patient agrees to Care Plan and Follow-up.  Plan: Telephone follow up appointment with care management team member scheduled for:  ~8 weeks  Catie Darnelle Maffucci, PharmD, [place], [place] Clinical Pharmacist [employer] [facility] [employer] [phone]

## 2021-07-22 ENCOUNTER — Encounter: Payer: Self-pay | Admitting: Internal Medicine

## 2021-07-22 ENCOUNTER — Telehealth: Payer: Self-pay | Admitting: Internal Medicine

## 2021-07-22 DIAGNOSIS — R42 Dizziness and giddiness: Secondary | ICD-10-CM | POA: Insufficient documentation

## 2021-07-22 NOTE — Assessment & Plan Note (Signed)
Needs to use cpap regularly.

## 2021-07-22 NOTE — Assessment & Plan Note (Signed)
Low carb diet and exercise.  Follow met b and a1c.  

## 2021-07-22 NOTE — Assessment & Plan Note (Signed)
Persistent intermittent issue for several months.  Headaches appear to be stable.  Has a history of vertigo.  Reports some intermittent pressure right ear.  Persistent tinnitus.  Steroid nasal spray as directed.  Have ENT evaluate.

## 2021-07-22 NOTE — Assessment & Plan Note (Signed)
Follow cbc.  

## 2021-07-22 NOTE — Assessment & Plan Note (Signed)
On lipitor.  Low cholesterol diet and exercise.  Follow lipid panel and liver function tests.   

## 2021-07-22 NOTE — Telephone Encounter (Signed)
She saw ortho [name]) [facility] ortho.  He had recommended MRI femur.  She states she has not heard regarding appt.  Can you call and f/u to confirm if still trying to schedule, etc.  She is agreeable to have the MRI.   Thanks.

## 2021-07-22 NOTE — Assessment & Plan Note (Signed)
Followed by ortho 

## 2021-07-22 NOTE — Assessment & Plan Note (Signed)
Seeing psychiatry.  Overall appears to be doing well on current medication regimen.

## 2021-07-22 NOTE — Assessment & Plan Note (Signed)
Followed by pain clinic.  

## 2021-07-22 NOTE — Assessment & Plan Note (Signed)
Soft tissue mass - right anterior mid thigh.  Ortho recommended MRI.  Has not had.  Needs f/u with ortho regarding scheduling.

## 2021-07-22 NOTE — Assessment & Plan Note (Signed)
Followed by neurology.  Intermittent headaches.  Medication regimen as outlined.  Follow.

## 2021-07-24 DIAGNOSIS — M5412 Radiculopathy, cervical region: Secondary | ICD-10-CM | POA: Diagnosis not present

## 2021-07-24 DIAGNOSIS — M5137 Other intervertebral disc degeneration, lumbosacral region: Secondary | ICD-10-CM | POA: Diagnosis not present

## 2021-07-24 DIAGNOSIS — M5442 Lumbago with sciatica, left side: Secondary | ICD-10-CM | POA: Diagnosis not present

## 2021-07-24 DIAGNOSIS — M7918 Myalgia, other site: Secondary | ICD-10-CM | POA: Diagnosis not present

## 2021-07-24 DIAGNOSIS — G2581 Restless legs syndrome: Secondary | ICD-10-CM | POA: Diagnosis not present

## 2021-07-24 DIAGNOSIS — M1611 Unilateral primary osteoarthritis, right hip: Secondary | ICD-10-CM | POA: Diagnosis not present

## 2021-07-24 DIAGNOSIS — G44209 Tension-type headache, unspecified, not intractable: Secondary | ICD-10-CM | POA: Diagnosis not present

## 2021-07-24 DIAGNOSIS — M25551 Pain in right hip: Secondary | ICD-10-CM | POA: Diagnosis not present

## 2021-07-24 DIAGNOSIS — M47816 Spondylosis without myelopathy or radiculopathy, lumbar region: Secondary | ICD-10-CM | POA: Diagnosis not present

## 2021-07-24 NOTE — Telephone Encounter (Signed)
Per chart, order was faxed to [facility]iology. Order and authorization for MRI have expired as of 04/2021. Receptionist at [facility] ortho stated that she is going to send a message back and someone from their office will reach out to patient to set up appts. Stated she may need to be seen again since this was back in May

## 2021-07-31 ENCOUNTER — Encounter: Payer: Self-pay | Admitting: Internal Medicine

## 2021-07-31 NOTE — Telephone Encounter (Signed)
Contacted patient to follow up on sample pick up. She notes that she thinks her pharmacy has the Ozempic 2 mg ready for her to pick up. She will check and let me know today whether she still plans to come pick up the sample or if she was able to pick up from the pharmacy.

## 2021-08-02 DIAGNOSIS — Z0100 Encounter for examination of eyes and vision without abnormal findings: Secondary | ICD-10-CM | POA: Diagnosis not present

## 2021-08-02 NOTE — Telephone Encounter (Signed)
Patients daughter has picked up ozempic sample

## 2021-08-06 ENCOUNTER — Other Ambulatory Visit: Payer: Self-pay | Admitting: Orthopedic Surgery

## 2021-08-06 DIAGNOSIS — Z96641 Presence of right artificial hip joint: Secondary | ICD-10-CM

## 2021-08-06 DIAGNOSIS — M25551 Pain in right hip: Secondary | ICD-10-CM | POA: Diagnosis not present

## 2021-08-09 ENCOUNTER — Ambulatory Visit (INDEPENDENT_AMBULATORY_CARE_PROVIDER_SITE_OTHER): Payer: Medicare HMO | Admitting: Dermatology

## 2021-08-09 ENCOUNTER — Encounter: Payer: Self-pay | Admitting: Internal Medicine

## 2021-08-09 ENCOUNTER — Other Ambulatory Visit: Payer: Self-pay

## 2021-08-09 DIAGNOSIS — R21 Rash and other nonspecific skin eruption: Secondary | ICD-10-CM | POA: Diagnosis not present

## 2021-08-09 DIAGNOSIS — S91104A Unspecified open wound of right lesser toe(s) without damage to nail, initial encounter: Secondary | ICD-10-CM | POA: Diagnosis not present

## 2021-08-09 DIAGNOSIS — Z86018 Personal history of other benign neoplasm: Secondary | ICD-10-CM

## 2021-08-09 DIAGNOSIS — L821 Other seborrheic keratosis: Secondary | ICD-10-CM

## 2021-08-09 DIAGNOSIS — R234 Changes in skin texture: Secondary | ICD-10-CM | POA: Diagnosis not present

## 2021-08-09 DIAGNOSIS — L219 Seborrheic dermatitis, unspecified: Secondary | ICD-10-CM | POA: Diagnosis not present

## 2021-08-09 DIAGNOSIS — L719 Rosacea, unspecified: Secondary | ICD-10-CM

## 2021-08-09 DIAGNOSIS — D229 Melanocytic nevi, unspecified: Secondary | ICD-10-CM

## 2021-08-09 DIAGNOSIS — D1800 Hemangioma unspecified site: Secondary | ICD-10-CM

## 2021-08-09 DIAGNOSIS — L578 Other skin changes due to chronic exposure to nonionizing radiation: Secondary | ICD-10-CM

## 2021-08-09 DIAGNOSIS — L720 Epidermal cyst: Secondary | ICD-10-CM | POA: Diagnosis not present

## 2021-08-09 DIAGNOSIS — L814 Other melanin hyperpigmentation: Secondary | ICD-10-CM

## 2021-08-09 DIAGNOSIS — Z1283 Encounter for screening for malignant neoplasm of skin: Secondary | ICD-10-CM

## 2021-08-09 DIAGNOSIS — T148XXA Other injury of unspecified body region, initial encounter: Secondary | ICD-10-CM

## 2021-08-09 DIAGNOSIS — L309 Dermatitis, unspecified: Secondary | ICD-10-CM

## 2021-08-09 MED ORDER — CLOBETASOL PROPIONATE 0.05 % EX SOLN: CUTANEOUS | 3 refills | Status: DC

## 2021-08-09 MED ORDER — KETOCONAZOLE 2 % EX SHAM
MEDICATED_SHAMPOO | CUTANEOUS | 3 refills | Status: DC
Start: 2021-08-09 — End: 2022-02-05

## 2021-08-09 MED ORDER — MUPIROCIN 2 % EX OINT: TOPICAL_OINTMENT | CUTANEOUS | 1 refills | Status: DC

## 2021-08-09 MED ORDER — ERYTHROMYCIN 2 % EX SOLN: Freq: Two times a day (BID) | CUTANEOUS | 3 refills | Status: DC

## 2021-08-09 NOTE — Progress Notes (Signed)
Follow-Up Visit   Subjective  Kathleen Everett is a 50 y.o. female who presents for the following: Annual Exam (Hx dysplastic nevus). The patient presents for Total-Body Skin Exam (TBSE) for skin cancer screening and mole check. Patient c/o itchy scale of the scalp. She is using Clobetasol ointment to aa QD PRN. Hand dermatitis doing OK with occasional itch. She is using Clobetasol ointment   The following portions of the chart were reviewed this encounter and updated as appropriate:   Tobacco  Allergies  Meds  Problems  Med Hx  Surg Hx  Fam Hx      Review of Systems:  No other skin or systemic complaints except as noted in HPI or Assessment and Plan.  Objective  Well appearing patient in no apparent distress; mood and affect are within normal limits.  A full examination was performed including scalp, head, eyes, ears, nose, lips, neck, chest, axillae, abdomen, back, buttocks, bilateral upper extremities, bilateral lower extremities, hands, feet, fingers, toes, fingernails, and toenails. All findings within normal limits unless otherwise noted below.  R foot Fissure.   Med thighs Papules and scars.   B/L hand Scaly pink plaques  L inguinal fold 0.6 cm SQ pink papule with overlying punctum.  R 2nd toe Healing ulceration.   Face Redness and papules.  Scalp Erythema and scale of the scalp.   Assessment & Plan  Fissure in skin R foot  Recommend following up with podiatrist as recommended by PCP. Start OTC Urea cream once to twice daily to thickened skin (avoid to any open areas).   Rash and other nonspecific skin eruption Med thighs  Folliculitis vs hidradenitis suppurativa  Start Hibiclens wash in the shower to aa's thighs. Do not get in the vaginal area.   Start Erythromycin to aa's QD.   Hand dermatitis B/L hand  Chronic condition with duration or expected duration over one year. Condition is bothersome to patient. Currently flared.  D/c  clobetasol, start tacrolimus ointment twice a day    Related Medications tacrolimus (PROTOPIC) 0.1 % ointment Apply topically 2 (two) times daily.  Epidermal inclusion cyst L inguinal fold  Benign-appearing. Exam most consistent with an epidermal inclusion cyst. Discussed that a cyst is a benign growth that can grow over time and sometimes get irritated or inflamed. Recommend observation if it is not bothersome. Discussed option of surgical excision to remove it if it is growing, symptomatic, or other changes noted. Please call for new or changing lesions so they can be evaluated.    Open wound R 2nd toe  Healing ulceration from trauma (hit on piece of wood) -   Start Mupirocin 2% ointment TID until healed. Recommend contacting PCP to see when most recent tetanus shot was and following their recommendations if she needs an update  mupirocin ointment (BACTROBAN) 2 % - R 2nd toe Apply to healing wound TID.  Rosacea Face  If not bothersome no tx needed. No hx of eye involvement.  Patient deferred treatment at this time.  Rosacea is a chronic progressive skin condition usually affecting the face of adults, causing redness and/or acne bumps. It is treatable but not curable. It sometimes affects the eyes (ocular rosacea) as well. It may respond to topical and/or systemic medication and can flare with stress, sun exposure, alcohol, exercise and some foods.  Daily application of broad spectrum spf 30+ sunscreen to face is recommended to reduce flares.   Seborrheic dermatitis Scalp  Seborrheic Dermatitis  -  is  a chronic persistent rash characterized by pinkness and scaling most commonly of the mid face but also can occur on the scalp (dandruff), ears; mid chest, mid back and groin.  It tends to be exacerbated by stress and cooler weather.  People who have neurologic disease may experience new onset or exacerbation of existing seborrheic dermatitis.  The condition is not curable but  treatable and can be controlled.  Chronic condition with duration or expected duration over one year. Condition is bothersome to patient. Currently flared.  Start Ketoconazole 2% shampoo 3d/wk.   Start Clobetasol sol to aa's scalp QD PRN. Topical steroids (such as triamcinolone, fluocinolone, fluocinonide, mometasone, clobetasol, halobetasol, betamethasone, hydrocortisone) can cause thinning and lightening of the skin if they are used for too long in the same area. Your physician has selected the right strength medicine for your problem and area affected on the body. Please use your medication only as directed by your physician to prevent side effects.    ketoconazole (NIZORAL) 2 % shampoo - Scalp Shampoo into scalp let sit 5-10 mins then wash out. Use 3d/wk.  clobetasol (TEMOVATE) 0.05 % external solution - Scalp Apply to aa's scalp QD PRN. Avoid the face, groin, and axilla. Avoid applying to face, groin, and axilla. Use as directed. Risk of skin atrophy with long-term use reviewed.  Lentigines - Scattered tan macules - Due to sun exposure - Benign-appearing, observe - Recommend daily broad spectrum sunscreen SPF 30+ to sun-exposed areas, reapply every 2 hours as needed. - Call for any changes  Seborrheic Keratoses - Stuck-on, waxy, tan-brown papules and/or plaques  - Benign-appearing - Discussed benign etiology and prognosis. - Observe - Call for any changes  Melanocytic Nevi - Tan-brown and/or pink-flesh-colored symmetric macules and papules - Benign appearing on exam today - Observation - Call clinic for new or changing moles - Recommend daily use of broad spectrum spf 30+ sunscreen to sun-exposed areas.   Hemangiomas - Red papules - Discussed benign nature - Observe - Call for any changes  Actinic Damage - Chronic condition, secondary to cumulative UV/sun exposure - diffuse scaly erythematous macules with underlying dyspigmentation - Recommend daily broad spectrum  sunscreen SPF 30+ to sun-exposed areas, reapply every 2 hours as needed.  - Staying in the shade or wearing long sleeves, sun glasses (UVA+UVB protection) and wide brim hats (4-inch brim around the entire circumference of the hat) are also recommended for sun protection.  - Call for new or changing lesions.  Skin cancer screening performed today.  Return for surgery of cystic nodule in 3 mths; 1 year TBSE .  Luther Redo, CMA, am acting as scribe for Forest Gleason, MD .  Documentation: I have reviewed the above documentation for accuracy and completeness, and I agree with the above.  Forest Gleason, MD

## 2021-08-09 NOTE — Telephone Encounter (Signed)
Dermatologist recommended she come to get her tetanus shot . The dermatologist prescribed antibiotic ointment for the site. Can she come in for a tetanus shot?

## 2021-08-09 NOTE — Patient Instructions (Addendum)
If you have any questions or concerns for your doctor, please call our main line at [phone] and press option 4 to reach your doctor's medical assistant. If no one answers, please leave a voicemail as directed and we will return your call as soon as possible. Messages left after 4 pm will be answered the following business day.   You may also send [place] a message via [place]. We typically respond to MyChart messages within 1-2 business days.  For prescription refills, please ask your pharmacy to contact our office. Our fax number is [phone].  If you have an urgent issue when the clinic is closed that cannot wait until the next business day, you can page your doctor at the number below.    Please note that while we do our best to be available for urgent issues outside of office hours, we are not available 24/7.   If you have an urgent issue and are unable to reach [place], you may choose to seek medical care at your doctor's office, retail clinic, urgent care center, or emergency room.  If you have a medical emergency, please immediately call 911 or go to the emergency department.  Pager Numbers  - Dr. Nehemiah Massed: [phone]  - Dr. Laurence Ferrari: [phone]  - Dr. Nicole Kindred: [phone]  In the event of inclement weather, please call our main line at [phone] for an update on the status of any delays or closures.  Dermatology Medication Tips: Please keep the boxes that topical medications come in in order to help keep track of the instructions about where and how to use these. Pharmacies typically print the medication instructions only on the boxes and not directly on the medication tubes.   If your medication is too expensive, please contact our office at [phone] option 4 or send [place] a message through [place].   We are unable to tell what your co-pay for medications will be in advance as this is different depending on your insurance coverage. However, we may be able to find a substitute  medication at lower cost or fill out paperwork to get insurance to cover a needed medication.   If a prior authorization is required to get your medication covered by your insurance company, please allow [place] 1-2 business days to complete this process.  Drug prices often vary depending on where the prescription is filled and some pharmacies may offer cheaper prices.  The website www.goodrx.com contains coupons for medications through different pharmacies. The prices here do not account for what the cost may be with help from insurance (it may be cheaper with your insurance), but the website can give you the price if you did not use any insurance.  - You can print the associated coupon and take it with your prescription to the pharmacy.  - You may also stop by our office during regular business hours and pick up a GoodRx coupon card.  - If you need your prescription sent electronically to a different pharmacy, notify our office through [facility] or by phone at [phone] option 4.'   Pre-Operative Instructions  You are scheduled for a surgical procedure at [facility]. We recommend you read the following instructions. If you have any questions or concerns, please call the office at [phone].  Shower and wash the entire body with soap and water the day of your surgery paying special attention to cleansing at and around the planned surgery site.  Avoid aspirin or aspirin containing products at least fourteen (14) days  prior to your surgical procedure and for at least one week (7 Days) after your surgical procedure. If you take aspirin on a regular basis for heart disease or history of stroke or for any other reason, we may recommend you continue taking aspirin but please notify us if you take this on a regular basis. Aspirin can cause more bleeding to occur during surgery as well as prolonged bleeding and bruising after surgery.   Avoid other nonsteroidal pain medications at  least one week prior to surgery and at least one week prior to your surgery. These include medications such as Ibuprofen (Motrin, Advil and Nuprin), Naprosyn, Voltaren, Relafen, etc. If medications are used for therapeutic reasons, please inform us as they can cause increased bleeding or prolonged bleeding during and bruising after surgical procedures.   Please advise [place] if you are taking any "blood thinner" medications such as Coumadin or Dipyridamole or Plavix or similar medications. These cause increased bleeding and prolonged bleeding during procedures and bruising after surgical procedures. We may have to consider discontinuing these medications briefly prior to and shortly after your surgery if safe to do so.   Please inform us of all medications you are currently taking. All medications that are taken regularly should be taken the day of surgery as you always do. Nevertheless, we need to be informed of what medications you are taking prior to surgery to know whether they will affect the procedure or cause any complications.   Please inform us of any medication allergies. Also inform us of whether you have allergies to Latex or rubber products or whether you have had any adverse reaction to Lidocaine or Epinephrine.  Please inform us of any prosthetic or artificial body parts such as artificial heart valve, joint replacements, etc., or similar condition that might require preoperative antibiotics.   We recommend avoidance of alcohol at least two weeks prior to surgery and continued avoidance for at least two weeks after surgery.   We recommend discontinuation of tobacco smoking at least two weeks prior to surgery and continued abstinence for at least two weeks after surgery.  Do not plan strenuous exercise, strenuous work or strenuous lifting for approximately four weeks after your surgery.   We request if you are unable to make your scheduled surgical appointment, please call us at least a  week in advance or as soon as you are aware of a problem so that we can cancel or reschedule the appointment.   You MAY TAKE TYLENOL (acetaminophen) for pain as it is not a blood thinner.   PLEASE PLAN TO BE IN TOWN FOR TWO WEEKS FOLLOWING SURGERY, THIS IS IMPORTANT SO YOU CAN BE CHECKED FOR DRESSING CHANGES, SUTURE REMOVAL AND TO MONITOR FOR POSSIBLE COMPLICATIONS.    Start Hibiclens wash daily in the shower to inner thighs. Avoid vaginal area as it will likely cause yeast infection if used in that area.   Recommend over the counter urea cream once to twice daily to fissure on the foot.

## 2021-08-10 ENCOUNTER — Encounter: Payer: Self-pay | Admitting: Internal Medicine

## 2021-08-10 NOTE — Telephone Encounter (Signed)
Order placed for podiatry referral.   

## 2021-08-10 NOTE — Telephone Encounter (Signed)
See other message

## 2021-08-10 NOTE — Telephone Encounter (Signed)
Patient is overdue for tetanus shot. She is going to get at pharmacy. Derm looked at cut on her foot and gave abx ointment. PT will let me know if does not resolve. She says podiatry referral was discussed at last visit for fissure on her foot. Are you ok with me placing referral to podiatry?

## 2021-08-14 ENCOUNTER — Encounter: Payer: Self-pay | Admitting: Dermatology

## 2021-08-16 DIAGNOSIS — R42 Dizziness and giddiness: Secondary | ICD-10-CM | POA: Diagnosis not present

## 2021-08-16 DIAGNOSIS — H903 Sensorineural hearing loss, bilateral: Secondary | ICD-10-CM | POA: Diagnosis not present

## 2021-08-21 ENCOUNTER — Ambulatory Visit
Admission: RE | Admit: 2021-08-21 | Discharge: 2021-08-21 | Disposition: A | Discharge status: Home / Self Care | Payer: Medicare HMO | Source: Ambulatory Visit | Attending: Orthopedic Surgery | Admitting: Orthopedic Surgery

## 2021-08-21 ENCOUNTER — Other Ambulatory Visit: Payer: Self-pay

## 2021-08-21 ENCOUNTER — Encounter
Admission: RE | Admit: 2021-08-21 | Discharge: 2021-08-21 | Disposition: A | Discharge status: Home / Self Care | Payer: Medicare HMO | Source: Ambulatory Visit | Attending: Orthopedic Surgery | Admitting: Orthopedic Surgery

## 2021-08-21 DIAGNOSIS — Z96641 Presence of right artificial hip joint: Secondary | ICD-10-CM | POA: Insufficient documentation

## 2021-08-21 DIAGNOSIS — M25551 Pain in right hip: Secondary | ICD-10-CM | POA: Insufficient documentation

## 2021-08-21 MED ORDER — TECHNETIUM TC 99M MEDRONATE IV KIT
20.0000 | PACK | Freq: Once | INTRAVENOUS | Status: AC | PRN
  Administered 2021-08-21: 20.1 via INTRAVENOUS

## 2021-08-22 ENCOUNTER — Encounter: Payer: Self-pay | Admitting: Podiatry

## 2021-08-22 ENCOUNTER — Ambulatory Visit: Payer: Medicare HMO | Admitting: Podiatry

## 2021-08-22 DIAGNOSIS — L600 Ingrowing nail: Secondary | ICD-10-CM

## 2021-08-22 DIAGNOSIS — R2689 Other abnormalities of gait and mobility: Secondary | ICD-10-CM | POA: Diagnosis not present

## 2021-08-22 DIAGNOSIS — G935 Compression of brain: Secondary | ICD-10-CM | POA: Diagnosis not present

## 2021-08-22 DIAGNOSIS — R234 Changes in skin texture: Secondary | ICD-10-CM

## 2021-08-22 DIAGNOSIS — B353 Tinea pedis: Secondary | ICD-10-CM

## 2021-08-22 DIAGNOSIS — G3184 Mild cognitive impairment, so stated: Secondary | ICD-10-CM | POA: Diagnosis not present

## 2021-08-22 DIAGNOSIS — G43019 Migraine without aura, intractable, without status migrainosus: Secondary | ICD-10-CM | POA: Diagnosis not present

## 2021-08-22 MED ORDER — CLOTRIMAZOLE-BETAMETHASONE 1-0.05 % EX CREA: 1.0000 "application " | TOPICAL_CREAM | Freq: Two times a day (BID) | CUTANEOUS | 0 refills | Status: DC

## 2021-08-23 ENCOUNTER — Ambulatory Visit (HOSPITAL_BASED_OUTPATIENT_CLINIC_OR_DEPARTMENT_OTHER): Payer: Medicare HMO | Admitting: Psychiatry

## 2021-08-23 ENCOUNTER — Other Ambulatory Visit: Payer: Self-pay

## 2021-08-23 DIAGNOSIS — F411 Generalized anxiety disorder: Secondary | ICD-10-CM

## 2021-08-23 DIAGNOSIS — F331 Major depressive disorder, recurrent, moderate: Secondary | ICD-10-CM

## 2021-08-23 MED ORDER — METHYLPHENIDATE HCL 10 MG PO TABS: 15.0000 mg | ORAL_TABLET | Freq: Every day | ORAL | 0 refills | Status: DC

## 2021-08-23 MED ORDER — SERTRALINE HCL 100 MG PO TABS: 200.0000 mg | ORAL_TABLET | Freq: Every day | ORAL | 0 refills | Status: DC

## 2021-08-23 MED ORDER — LITHIUM CARBONATE ER 300 MG PO TBCR: 300.0000 mg | EXTENDED_RELEASE_TABLET | Freq: Two times a day (BID) | ORAL | 1 refills | Status: DC

## 2021-08-23 NOTE — Progress Notes (Signed)
Virtual Visit via Telephone Note  I connected with Kathleen Everett on 08/23/21 at  1:00 PM EDT by telephone and verified that I am speaking with the correct person using two identifiers.  Location: Patient: home Provider: office   I discussed the limitations, risks, security and privacy concerns of performing an evaluation and management service by telephone and the availability of in person appointments. I also discussed with the patient that there may be a patient responsible charge related to this service. The patient expressed understanding and agreed to proceed.   History of Present Illness: Kathleen Everett is taking things day by day. She saw her neurologist earlier this week due to falls, memory and difficulty talking. She describes things like not being able to get words she is thinking out of mouth. Her neurologist made a referral for speech therapy. She will also be getting a brain MRI but doesn't like being in closed off spaces. She has noticed any improvement in focus or memory with Ritalin. Her depression is a little better. She can tell her the meds help with her crying spells.  Her motivation is low and she often doesn't want to go out anywhere. Kathleen Everett feels depressed every day and her average level is 5/10 (10 being the worst). Kathleen Everett feels like everything is an effort and she will go a couple of days without bathing if she is not going out. She is isolating. Her stress tolerance is low. She is having racing thoughts and always focused on racing thoughts. She makes an effort to focus on good things. 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 Her sleep is poor over the last few weeks. She is waking multiple times during the night and it could be related to pain. She denies SI/HI.    Observations/Objective:  General Appearance: unable to assess  Eye Contact:  unable to assess  Speech:  Clear and Coherent and Normal Rate  Volume:  Normal  Mood:  Depressed  Affect:  Congruent   Thought Process:  Goal Directed, Linear, and Descriptions of Associations: Intact  Orientation:  Full (Time, Place, and Person)  Thought Content:  Logical  Suicidal Thoughts:  No  Homicidal Thoughts:  No  Memory:  Immediate;   Good  Judgement:  Good  Insight:  Good  Psychomotor Activity: unable to assess  Concentration:  Concentration: Good  Recall:  Good  Fund of Knowledge:  Good  Language:  Good  Akathisia:  unable to assess  Handed:  unable to assess  AIMS (if indicated):     Assets:  Communication Skills Desire for Improvement Financial Resources/Insurance Housing Resilience Social Support Talents/Skills Transportation Vocational/Educational  ADL's:  unable to assess  Cognition:  WNL  Sleep:        Assessment and Plan: Depression screen [facility] 2/9 08/23/2021 06/28/2021 05/31/2021 03/22/2021 02/08/2021  Decreased Interest 3 3 2 1 3   Down, Depressed, Hopeless 3 3 2 3 3   PHQ - 2 Score 6 6 4 4 6   Altered sleeping 3 3 3 3 3   Tired, decreased energy 3 3 3 2 3   Change in appetite 2 1 1  0 0  Feeling bad or failure about yourself  3 3 3 3 2   Trouble concentrating 3 3 3  0 2  Moving slowly or fidgety/restless 0 0 - 0 2  Suicidal thoughts 0 0 1 0 1  PHQ-9 Score 20 19 18 12 19   Difficult doing work/chores Very difficult Very difficult Extremely dIfficult Very difficult Very difficult  Some recent data might be hidden    [place] Office Visit from 08/23/2021 in [facility] Video Visit from 06/28/2021 in [facility] Video Visit from 05/31/2021 in [place] Error: Q3, 4, or 5 should not be populated when Q2 is No Error: Q3, 4, or 5 should not be populated when Q2 is No Error: Q3, 4, or 5 should not be populated when Q2 is No      - reviewed labs done 08/21/2021 and she is working with her PCP  - will increase Ritalin for depression  augmentation and help with focus and energy  - start Lithobid 300mg  po qD for depression  1. MDD (major depressive disorder), recurrent episode, moderate (HCC) - methylphenidate (RITALIN) 10 MG tablet; Take 1.5 tablets (15 mg total) by mouth daily.  Dispense: 45 tablet; Refill: 0 - methylphenidate (RITALIN) 10 MG tablet; Take 1.5 tablets (15 mg total) by mouth daily.  Dispense: 45 tablet; Refill: 0 - sertraline (ZOLOFT) 100 MG tablet; Take 2 tablets (200 mg total) by mouth daily.  Dispense: 180 tablet; Refill: 0 - lithium carbonate (LITHOBID) 300 MG CR tablet; Take 1 tablet (300 mg total) by mouth 2 (two) times daily.  Dispense: 30 tablet; Refill: 1  2. Generalized anxiety disorder - sertraline (ZOLOFT) 100 MG tablet; Take 2 tablets (200 mg total) by mouth daily.  Dispense: 180 tablet; Refill: 0    Follow Up Instructions: In 1-2 months or sooner if needed   I discussed the assessment and treatment plan with the patient. The patient was provided an opportunity to ask questions and all were answered. The patient agreed with the plan and demonstrated an understanding of the instructions.   The patient was advised to call back or seek an in-person evaluation if the symptoms worsen or if the condition fails to improve as anticipated.  I provided 19 minutes of non-face-to-face time during this encounter.   Charlcie Cradle, MD

## 2021-08-27 DIAGNOSIS — M7989 Other specified soft tissue disorders: Secondary | ICD-10-CM | POA: Diagnosis not present

## 2021-08-27 DIAGNOSIS — Z96641 Presence of right artificial hip joint: Secondary | ICD-10-CM | POA: Diagnosis not present

## 2021-08-27 DIAGNOSIS — S76811A Strain of other specified muscles, fascia and tendons at thigh level, right thigh, initial encounter: Secondary | ICD-10-CM | POA: Diagnosis not present

## 2021-08-27 DIAGNOSIS — M25551 Pain in right hip: Secondary | ICD-10-CM | POA: Diagnosis not present

## 2021-08-27 NOTE — Progress Notes (Signed)
  Subjective:  Patient ID: Kathleen Everett, female    DOB: 06-07-1971,  MRN: [id number]  Chief Complaint  Patient presents with   Foot Pain    Patient presents today for crack in right heel x years off and on    50 y.o. female presents with the above complaint. History confirmed with patient.  She also has ingrowing toenails on both big toes that she would like to be removed at some point.  She is tried heel cream for both heels and it gets worse certain points of the year.  Objective:  Physical Exam: warm, good capillary refill, no trophic changes or ulcerative lesions, normal DP and PT pulses, and normal sensory exam.  Dry scaling cracking skin bilateral heels, she has ingrown's without paronychia of both great toes Assessment:   1. Tinea pedis of both feet   2. Fissured skin   3. Ingrowing left great toenail   4. Ingrowing right great toenail      Plan:  Patient was evaluated and treated and all questions answered.  Discussed with her I suspect the fissuring and cracking skin is likely secondary to tinea pedis.  I prescribed her Lotrisone cream and also recommended use daily moisturizer and/or a urea cream.  Could consider oral therapy as well or biopsy if not improving  She has ingrown toenails on both great toes and would like them removed permanently at some point.  We will plan to do this over the winter  Return in about 6 weeks (around 10/03/2021) for check on cracked heels.

## 2021-08-28 ENCOUNTER — Ambulatory Visit (INDEPENDENT_AMBULATORY_CARE_PROVIDER_SITE_OTHER): Payer: Medicare HMO | Admitting: Pharmacist

## 2021-08-28 VITALS — Wt 258.0 lb

## 2021-08-28 DIAGNOSIS — F325 Major depressive disorder, single episode, in full remission: Secondary | ICD-10-CM

## 2021-08-28 DIAGNOSIS — R739 Hyperglycemia, unspecified: Secondary | ICD-10-CM

## 2021-08-28 DIAGNOSIS — E7800 Pure hypercholesterolemia, unspecified: Secondary | ICD-10-CM

## 2021-08-28 MED ORDER — SEMAGLUTIDE (2 MG/DOSE) 8 MG/3ML ~~LOC~~ SOPN: 2.0000 mg | PEN_INJECTOR | SUBCUTANEOUS | 2 refills | Status: DC

## 2021-08-28 NOTE — Chronic Care Management (AMB) (Signed)
Chronic Care Management Pharmacy Note  08/28/2021 Name:  Kathleen Everett MRN:  [id number] DOB:  [birth year]   Subjective: Kathleen Everett is an 50 y.o. year old female who is a primary patient of [name], MD.  The CCM team was consulted for assistance with disease management and care coordination needs.    Engaged with patient by telephone for follow up visit in response to provider referral for pharmacy case management and/or care coordination services.   Consent to Services:  The patient was given information about Chronic Care Management services, agreed to services, and gave verbal consent prior to initiation of services.  Please see initial visit note for detailed documentation.   Patient Care Team: [name], MD as PCP - General (Internal Medicine) [name], MD as Referring Physician (Neurology) [name], MD as Consulting Physician (Cardiology) [name], RPH-CPP (Pharmacist)    Objective:  Lab Results  Component Value Date   CREATININE 0.81 07/17/2021   CREATININE 0.74 03/01/2021   CREATININE 0.95 06/26/2020    Lab Results  Component Value Date   HGBA1C 5.5 07/17/2021   Last diabetic Eye exam: No results found for: HMDIABEYEEXA  Last diabetic Foot exam: No results found for: HMDIABFOOTEX      Component Value Date/Time   CHOL 264 (H) 07/17/2021 0920   CHOL 235 (H) 03/19/2012 0445   TRIG 185.0 (H) 07/17/2021 0920   TRIG 258 (H) 03/19/2012 0445   HDL 45.50 07/17/2021 0920   HDL 35 (L) 03/19/2012 0445   CHOLHDL 6 07/17/2021 0920   VLDL 37.0 07/17/2021 0920   VLDL 52 (H) 03/19/2012 0445   LDLCALC 181 (H) 07/17/2021 0920   LDLCALC 148 (H) 03/19/2012 0445   LDLDIRECT 181.0 06/26/2020 0855    Hepatic Function Latest Ref Rng & Units 07/17/2021 03/01/2021 06/26/2020  Total Protein 6.0 - 8.3 g/dL 7.1 6.8 6.9  Albumin 3.5 - 5.2 g/dL 4.6 4.2 4.3  AST 0 - 37 U/L 16 23 22   ALT 0 - 35 U/L 17 24 28   Alk Phosphatase 39  - 117 U/L 102 113 110  Total Bilirubin 0.2 - 1.2 mg/dL 0.5 0.4 0.3  Bilirubin, Direct 0.0 - 0.3 mg/dL 0.1 0.1 0.1    Lab Results  Component Value Date/Time   TSH 1.59 07/17/2021 09:20 AM   TSH 3.44 06/26/2020 08:55 AM    CBC Latest Ref Rng & Units 07/17/2021 06/26/2020 05/11/2020  WBC 4.0 - 10.5 K/uL 5.4 5.7 8.7  Hemoglobin 12.0 - 15.0 g/dL 13.5 12.7 11.9(L)  Hematocrit 36.0 - 46.0 % 41.3 38.5 36.4  Platelets 150.0 - 400.0 K/uL 233.0 248.0 276    Lab Results  Component Value Date/Time   VD25OH 23.27 (L) 06/18/2018 09:18 AM   VD25OH 28.42 (L) 08/01/2016 08:17 AM    Social History   Tobacco Use  Smoking Status Never  Smokeless Tobacco Never   BP Readings from Last 3 Encounters:  07/17/21 136/72  03/19/21 122/71  03/17/21 127/74   Pulse Readings from Last 3 Encounters:  07/17/21 78  03/19/21 63  03/17/21 71   Wt Readings from Last 3 Encounters:  08/28/21 258 lb (117 kg)  07/17/21 269 lb (122 kg)  07/03/21 275 lb (124.7 kg)    Assessment: Review of patient past medical history, allergies, medications, health status, including review of consultants reports, laboratory and other test data, was performed as part of comprehensive evaluation and provision of chronic care management services.   SDOH:  (Social  Determinants of Health) assessments and interventions performed:  SDOH Interventions    Flowsheet Row Most Recent Value  SDOH Interventions   Financial Strain Interventions Intervention Not Indicated       CCM Care Plan  Allergies  Allergen Reactions   Vancomycin Hives   Ceftin [Cefuroxime Axetil] Other (See Comments)    Upset stomach   Celexa [Citalopram Hydrobromide] Diarrhea    GI upset     Clindamycin/Lincomycin Rash   Penicillins Rash    Did it involve swelling of the face/tongue/throat, SOB, or low BP? No Did it involve sudden or severe rash/hives, skin peeling, or any reaction on the inside of your mouth or nose? Unknown Did you need to seek  medical attention at a hospital or doctor's office? No When did it last happen? childhood reaction If all above answers are "NO", may proceed with cephalosporin use.    Sulfa Antibiotics Rash    Medications Reviewed Today     Reviewed by Charlcie Cradle, MD (Psychiatrist) on 08/23/21 at 107  Med List Status: <None>   Medication Order Taking? Sig Documenting Provider Last Dose Status Informant  acetaminophen (TYLENOL) 500 MG tablet [id number] Yes Take 500-1,000 mg by mouth 2 (two) times daily as needed for mild pain.  [provider] Taking Active Self           Med Note Darnelle Maffucci, Arville Lime   Wed Mar 28, 2021  1:49 PM) Taking at least 1-2 times daily  AIMOVIG 140 MG/ML SOAJ [id number] Yes Inject 140 mg into the skin every 28 (twenty-eight) days. [provider] Taking Active   atorvastatin (LIPITOR) 10 MG tablet [id number] Yes Take 1 tablet (10 mg total) by mouth daily. [name], MD Taking Active   clobetasol (TEMOVATE) 0.05 % external solution [id number] Yes Apply to aa's scalp QD PRN. Avoid the face, groin, and axilla. Avoid applying to face, groin, and axilla. Use as directed. Risk of skin atrophy with long-term use reviewed. Laurence Ferrari, [place], MD Taking Active   clotrimazole-betamethasone Donalynn Furlong) cream [id number] Yes Apply 1 application topically 2 (two) times daily. Criselda Peaches, DPM Taking Active   cyanocobalamin (,VITAMIN B-12,) 1000 MCG/ML injection [id number] Yes INJECT 1 ML INTRAMUSCULARLY ONCE EVERY MONTH [name], MD Taking Active   diclofenac sodium (VOLTAREN) 1 % GEL [id number] Yes Apply 1 application topically 4 (four) times daily as needed (pain.).  [provider] Taking Active Self           Med Note [name]   Wed Mar 28, 2021  1:53 PM) PRN  erythromycin with ethanol [facility]) 2 % external solution [id number] Yes Apply topically 2 (two) times daily. As needed to affected areas of skin (pustules and bumps) Moye, [place],  MD Taking Active   esomeprazole (NEXIUM) 40 MG capsule [id number] Yes Take 1 capsule by mouth once daily [name], MD Taking Active   estradiol (ESTRACE VAGINAL) 0.1 MG/GM vaginal cream [id number] Yes One applicator q hs x 5 nights and then 2x/week.  Patient taking differently: Place 1 Applicatorful vaginally once a week.   [name], MD Taking Active   gabapentin (NEURONTIN) 400 MG capsule [id number] Yes Take by mouth. [provider] Taking Active   ibuprofen (ADVIL) 800 MG tablet [id number] Yes Take by mouth. [provider] Taking Active   ketoconazole (NIZORAL) 2 % shampoo [id number] Yes Shampoo into scalp let sit 5-10 mins then wash out. Use 3d/wk. Moye, [place], MD Taking Active   lactase (RA DAIRY AID) 3000 units tablet  [id number] Yes Take 9,000 Units by mouth 3 (three) times daily as needed (with dairy consumption).  [provider] Taking Active Self  methocarbamol (ROBAXIN) 500 MG tablet [id number] Yes Take 500 mg by mouth 4 (four) times daily. [provider] Taking Active            Med Note [name]   Wed Mar 28, 2021  1:57 PM) Using ~2-4 times daily  methylphenidate (RITALIN) 10 MG tablet [id number] Yes Take 1 tablet (10 mg total) by mouth daily. Charlcie Cradle, MD Taking Active   methylphenidate (RITALIN) 10 MG tablet [id number] Yes Take 1 tablet (10 mg total) by mouth daily. Charlcie Cradle, MD Taking Active   mupirocin ointment [facility]) 2 % [id number] Yes Apply to healing wound TID. Moye, [place], MD Taking Active   naloxone [facility]) 2 MG/2ML injection [id number] Yes Inject into the vein as needed. [provider] Taking Active   nystatin cream (MYCOSTATIN) [id number] Yes APPLY TOPICALLY TWICE DAILY Crecencio Mc, MD Taking Active Self  Oxycodone HCl 10 MG TABS [id number] Yes Take 10 mg by mouth 5 (five) times daily.  [provider] Taking Active Self           Med Note Kenton Kingfisher, Kathrine Haddock Nov 30, 2019  8:20 AM)    rizatriptan (MAXALT) 10 MG tablet [id number] Yes Take 10 mg by mouth as needed for migraine. May repeat in 2 hours if needed [provider] Taking Active Self           Med Note Nadara Eaton May 10, 2020  9:00 AM)    Semaglutide, 2 MG/DOSE, 8 MG/3ML SOPN [id number] Yes Inject 2 mg as directed once a week. [name], MD Taking Active   sertraline (ZOLOFT) 100 MG tablet [id number]  Take 2 tablets (200 mg total) by mouth daily. Charlcie Cradle, MD  Active   Simethicone (GAS RELIEF PO) [id number] Yes Take 125 mg by mouth 2 (two) times daily as needed for flatulence. Anti gas [provider] Taking Active Self  tacrolimus (PROTOPIC) 0.1 % ointment [id number] Yes Apply topically 2 (two) times daily. Moye, [place], MD Taking Active   triamcinolone cream (KENALOG) 0.1 % [id number] Yes APPLY  CREAM EXTERNALLY TWICE DAILY TO AFFECTED AREA ON  FINGERS [name], MD Taking Active   Ubrogepant (UBRELVY) 100 MG TABS [id number] Yes Take 100 mg by mouth 2 (two) times daily as needed. [provider] Taking Active             Patient Active Problem List   Diagnosis Date Noted   Dizziness 07/22/2021   Vitamin B12 deficiency    Vaginal irritation 03/03/2021   Soft tissue mass 03/03/2021   Hemorrhoids 12/23/2020   DDD (degenerative disc disease), lumbar 10/03/2020   Abscess of right hip 05/11/2020   Healthcare maintenance 01/02/2020   Anemia 01/02/2020   Status post total hip replacement, 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   Hyperglycemia 06/24/2019   Vaginal bleeding 09/30/2016   Primary osteoarthritis of right hip 01/17/2016   Chronic gastritis 07/18/2015   Depression, major, in remission ([place]) 05/01/2015   Generalized anxiety disorder 05/01/2015   Chronic tension-type headache, intractable 04/24/2015   Dysmenorrhea 04/24/2015   Endometriosis 04/24/2015   Hidradenitis 04/24/2015   Hot flashes 03/12/2015   Chest pain 02/15/2015   BMI 40.0-44.9,  adult ([place]) 10/16/2014   Breast pain, right 09/08/2014   Abdominal pain 07/24/2014   GERD (gastroesophageal reflux disease) 04/24/2014   Ankle pain,  right 04/24/2014   Chiari malformation type I 01/26/2014   Vitamin D deficiency 01/18/2014   Diverticulitis 01/10/2014   Hypercholesteremia 01/10/2014   Right hip pain 01/10/2014   Migraine headaches 01/10/2014   Obstructive sleep apnea 01/10/2014   Chronic back pain 12/13/2012   Mitral valve prolapse 12/13/2012     There is no immunization history on file for this patient.  Conditions to be addressed/monitored: Obesity, Depression  Care Plan : Medication Management  Updates made by [name], RPH-CPP since 08/28/2021 12:00 AM     Problem: Obesity, Chronic Pain, Migraines, Depression      Long-Range Goal: Disease Progression Prevention   Start Date: 03/28/2021  Recent Progress: On track  Priority: High  Note:   Current Barriers:  Unable to achieve control of weight  Complex patient with multiple comorbidities at a high risk for exacerbation and hospitalization  Pharmacist Clinical Goal(s):  Over the next 90 days, patient will achieve improvement of weight through collaboration with PharmD and provider.   Interventions: 1:1 collaboration with [name], MD regarding development and update of comprehensive plan of care as evidenced by provider attestation and co-signature Inter-disciplinary care team collaboration (see longitudinal plan of care) Comprehensive medication review performed; medication list updated in electronic medical record  SDOH: No medication cost concerns, Medicare + Medicaid   Hyperglycemia, Obesity: Unable to achieve weight loss through diet and exercise alone; current regimen: Ozempic 2 mg weekly Baseline weight: 292 lbs; most recent home weight: 258 lbs lbs; total weight loss thus far 34 lbs (12% loss from baseline) Hx bariatric clinic, but decided not to proceed with surgery -  benefited from the nutrition class, but husband did not want her to pursue surgery Hx bupropion, ineffective for mental health/weight management Hx topiramate, intolerable side effects of GI upset, CNS depression Hx phentermine years ago  Current dietary patterns: breakfast: 2 frozen waffles, eggs, oatmeal, coffee; sometimes orange juice; lunch: banana and peanut butter sandwich; water; supper: BBQ chicken, vegetables; snacks: occasional yogurt w/ granola  Current physical activity: plans to start hip PT [facility], otherwise, physical activity limited by hip pain Praised for continued success with weight loss. Extensive discussion regarding importance of continuing to monitor portion sizes, focus on dietary choices, proteins. Extensive discussion regarding physical activity. Recommend to continue current regimen. She was able to get a supply from the pharmacy. Sending 90 day supply to the pharmacy  Hyperlipidemia: Controlled per last lipid panel; current treatment: atorvastatin 10 mg daily  Previously recommended to continue current regimen at this time  Depression/Anxiety Uncontrolled; current treatment: sertraline 200 mg daily, methylphenidate 15 mg daily, lithium 300 mg BID prescribed ; follows w/ Dr. Doyne Keel Per documentation, hx: citalopram, escitalopram, fluoxetine, venlafaxine, duloxetine, hydroxyzine, quetiapine, hydroxyzine, Trintellix Concerned about addition of lithium. Worried about adding side effects of medications. Advised to continue to discuss concerns with Dr. Doyne Keel. Recommended to continue current reigmen along with psychiatry collaboration.   Chronic Pain: Moderately well controlled; current regimen: oxycodone 10 mg 5 times daily, gabapentin 400 mg TID, ibuprofen 800 mg TID PRN, acetaminophen [phone] mg PRN, methocarbamol 500 mg 2-3 times daily PRN; follows with [facility]  Miralax PRN Has script for naloxone at home. In date per [facility] Clinic  documentation Previously recommended to continue current regimen at this time along with neurology collaboration   Migraines, Cognitive Dysfunction: Moderately well controlled; current regimen: Ajovy 140 mg Q28 days, rizatriptan 10 mg PRN migraines, Ubrelvy 100 mg PRN migraines;  Previously recommended to continue  current regimen at this time along with neurology collaboration  Dermatologic Conditions - Warts and Dermatitis: Uncontrolled; current regimen: tacrolimus 0.1% ointment BID to hands, continue clobetasol 0.05% ointment BID on weekends and PRN; follows w/ Dr. Laurence Ferrari. Upcoming procedure in December.  Previously recommended to continue current regimen at this time  GERD, Indigestion: Moderately well controlled; current regimen: esomeprazole 40 mg PRN Previously recommended to continue current regimen at this time  Patient Goals/Self-Care Activities Over the next 90 days, patient will:  - take medications as prescribed collaborate with provider on medication access solutions engage in dietary modifications by moderating portion sizes  Follow Up Plan: Telephone follow up appointment with care management team member scheduled for: ~10 weeks     Medication Assistance: None required.  Patient affirms current coverage meets needs.  Patient's preferred pharmacy is:  [facility] [address] [place] - [place] [address] [place] [address] Phone: [phone] Fax: [place] [place] [address] [place] [address] Phone: [phone] Fax: [phone]    Follow Up:  Patient agrees to Care Plan and Follow-up.  Plan: Telephone follow up appointment with care management team member scheduled for:  ~ 10 weeks  Catie Darnelle Maffucci, PharmD, [place], [place] Clinical Pharmacist [employer] [facility] [employer] [phone]

## 2021-08-28 NOTE — Patient Instructions (Signed)
Visit Information  PATIENT GOALS:  Goals Addressed               This Visit's Progress     Patient Stated     Medication Monitoring (pt-stated)        Patient Goals/Self-Care Activities Over the next 90 days, patient will:  - take medications as prescribed collaborate with provider on medication access solutions engage in dietary modifications by moderating portion sizes         Patient verbalizes understanding of instructions provided today and agrees to view in [place].   Plan: Telephone follow up appointment with care management team member scheduled for:  ~ 10 weeks  Catie Darnelle Maffucci, PharmD, [place], [place] Clinical Pharmacist [employer] [facility] [employer] [phone]

## 2021-09-10 DIAGNOSIS — R42 Dizziness and giddiness: Secondary | ICD-10-CM | POA: Diagnosis not present

## 2021-09-17 DIAGNOSIS — F325 Major depressive disorder, single episode, in full remission: Secondary | ICD-10-CM | POA: Diagnosis not present

## 2021-09-17 DIAGNOSIS — E7800 Pure hypercholesterolemia, unspecified: Secondary | ICD-10-CM | POA: Diagnosis not present

## 2021-09-18 DIAGNOSIS — M160 Bilateral primary osteoarthritis of hip: Secondary | ICD-10-CM | POA: Diagnosis not present

## 2021-09-18 DIAGNOSIS — M25551 Pain in right hip: Secondary | ICD-10-CM | POA: Diagnosis not present

## 2021-09-18 DIAGNOSIS — M5137 Other intervertebral disc degeneration, lumbosacral region: Secondary | ICD-10-CM | POA: Diagnosis not present

## 2021-09-18 DIAGNOSIS — M7918 Myalgia, other site: Secondary | ICD-10-CM | POA: Diagnosis not present

## 2021-09-18 DIAGNOSIS — M5412 Radiculopathy, cervical region: Secondary | ICD-10-CM | POA: Diagnosis not present

## 2021-09-18 DIAGNOSIS — G2581 Restless legs syndrome: Secondary | ICD-10-CM | POA: Diagnosis not present

## 2021-09-18 DIAGNOSIS — M5442 Lumbago with sciatica, left side: Secondary | ICD-10-CM | POA: Diagnosis not present

## 2021-09-18 DIAGNOSIS — M47816 Spondylosis without myelopathy or radiculopathy, lumbar region: Secondary | ICD-10-CM | POA: Diagnosis not present

## 2021-09-18 DIAGNOSIS — G44209 Tension-type headache, unspecified, not intractable: Secondary | ICD-10-CM | POA: Diagnosis not present

## 2021-09-19 ENCOUNTER — Encounter: Payer: Self-pay | Admitting: Internal Medicine

## 2021-09-19 ENCOUNTER — Telehealth: Payer: Self-pay

## 2021-09-19 NOTE — Telephone Encounter (Signed)
What symptoms is she having?  When was she prescribed medication and who prescribed?

## 2021-09-19 NOTE — Telephone Encounter (Signed)
Patient stated she feels like she has a yeast infection, she feels like a cut in her vagina. She does have some discharge. Same as she was feeling previously.Patient has tried OTC medication.none seemed to work. She last received medication by PCP on 03-28-21.

## 2021-09-19 NOTE — Telephone Encounter (Signed)
Patient scheduled.

## 2021-09-19 NOTE — Telephone Encounter (Signed)
Patient scheduled for Friday  °

## 2021-09-19 NOTE — Telephone Encounter (Signed)
See if can come in Friday at 12:00 for evaluation.

## 2021-09-20 ENCOUNTER — Telehealth (HOSPITAL_BASED_OUTPATIENT_CLINIC_OR_DEPARTMENT_OTHER): Payer: Medicare HMO | Admitting: Psychiatry

## 2021-09-20 ENCOUNTER — Other Ambulatory Visit: Payer: Self-pay

## 2021-09-20 DIAGNOSIS — F411 Generalized anxiety disorder: Secondary | ICD-10-CM | POA: Diagnosis not present

## 2021-09-20 DIAGNOSIS — F331 Major depressive disorder, recurrent, moderate: Secondary | ICD-10-CM

## 2021-09-20 MED ORDER — LATUDA 20 MG PO TABS: 20.0000 mg | ORAL_TABLET | Freq: Every day | ORAL | 1 refills | Status: DC

## 2021-09-20 MED ORDER — METHYLPHENIDATE HCL 20 MG PO TABS: 20.0000 mg | ORAL_TABLET | Freq: Every day | ORAL | 0 refills | Status: DC

## 2021-09-20 NOTE — Progress Notes (Signed)
Virtual Visit via Video Note  I connected with Kathleen Everett on 09/20/21 at  9:00 AM EDT by a video enabled telemedicine application and verified that I am speaking with the correct person using two identifiers.  Location: Patient: home Provider: office   I discussed the limitations of evaluation and management by telemedicine and the availability of in person appointments. The patient expressed understanding and agreed to proceed.  History of Present Illness: Kathleen Everett states she was having more frequent headaches and weight gain (8 lbs in 3 weeks) after starting the Lithium. She stopped it about a week ago. She has since lost 1-2 lbs and her headaches decreased to where she was prior to starting Lithium. With the increased dose of Ritalin she has some random days of increased concentration. Other days she doesn't feel her concentration is good and is unable to retain information likes others. Her irritability is a little better. Kathleen Everett's anxiety remains high. Some days she is very frustrated due to her constant worrying and racing thoughts. Her sleep is broken and she is getting about 4 hrs/night. She states it is ok as long as she gets at least 4 hrs. Her energy is overall unchanged. Her depression is a little better and she has some good days. She has crying spells but they are not intense. Kathleen Everett's motivation is low and she is still experiencing anhedonia. She wants to do more with her kids but her energy and pain prevent her from doing so. Some days she feels empty and like something is missing from her life.    Observations/Objective: Psychiatric Specialty Exam: ROS  Last menstrual period 12/08/2006.There is no height or weight on file to calculate BMI.  General Appearance: Casual  Eye Contact:  Good  Speech:  Clear and Coherent and Normal Rate  Volume:  Normal  Mood:  Anxious and Depressed  Affect:  Congruent  Thought Process:  Goal Directed, Linear, and Descriptions of  Associations: Intact  Orientation:  Full (Time, Place, and Person)  Thought Content:  Logical  Suicidal Thoughts:  No  Homicidal Thoughts:  No  Memory:  Immediate;   Good  Judgement:  Good  Insight:  Good  Psychomotor Activity:  Normal  Concentration:  Concentration: Good  Recall:  Good  Fund of Knowledge:  Good  Language:  Good  Akathisia:  No  Handed:  Right  AIMS (if indicated):     Assets:  Communication Skills Desire for Improvement Financial Resources/Insurance Housing Intimacy Resilience Social Support Talents/Skills Transportation Vocational/Educational  ADL's:  Intact  Cognition:  WNL  Sleep:        Assessment and Plan: Depression screen [facility] 2/9 09/20/2021 08/23/2021 06/28/2021 05/31/2021 03/22/2021  Decreased Interest 3 3 3 2 1   Down, Depressed, Hopeless 2 3 3 2 3   PHQ - 2 Score 5 6 6 4 4   Altered sleeping 3 3 3 3 3   Tired, decreased energy 3 3 3 3 2   Change in appetite 3 2 1 1  0  Feeling bad or failure about yourself  3 3 3 3 3   Trouble concentrating 2 3 3 3  0  Moving slowly or fidgety/restless 0 0 0 - 0  Suicidal thoughts 0 0 0 1 0  PHQ-9 Score 19 20 19 18 12   Difficult doing work/chores Very difficult Very difficult Very difficult Extremely dIfficult Very difficult  Some recent data might be hidden    Flowsheet Row Video Visit from 09/20/2021 in [facility]  Visit from 08/23/2021 in [facility] Video Visit from 06/28/2021 in [facility]  C-SSRS RISK CATEGORY Error: Q3, 4, or 5 should not be populated when Q2 is No Error: Q3, 4, or 5 should not be populated when Q2 is No Error: Q3, 4, or 5 should not be populated when Q2 is No      D/c Lithium due to SE  Increase Ritalin 20mg  po qD for focus and MDD augmentation  Previous trials- Wellbutrin, Abilify  Start trial of Latuda   Continue Zoloft 200mg  po qD for MDD and GAD  1. MDD  (major depressive disorder), recurrent episode, moderate (HCC) - methylphenidate (RITALIN) 20 MG tablet; Take 1 tablet (20 mg total) by mouth daily.  Dispense: 30 tablet; Refill: 0 - methylphenidate (RITALIN) 20 MG tablet; Take 1 tablet (20 mg total) by mouth daily.  Dispense: 30 tablet; Refill: 0 - lurasidone (LATUDA) 20 MG TABS tablet; Take 1 tablet (20 mg total) by mouth daily.  Dispense: 30 tablet; Refill: 1  2. Generalized anxiety disorder   Follow Up Instructions: In 2-4 weeks or sooner if needed   I discussed the assessment and treatment plan with the patient. The patient was provided an opportunity to ask questions and all were answered. The patient agreed with the plan and demonstrated an understanding of the instructions.   The patient was advised to call back or seek an in-person evaluation if the symptoms worsen or if the condition fails to improve as anticipated.  I provided 20 minutes of non-face-to-face time during this encounter.   Charlcie Cradle, MD

## 2021-09-21 ENCOUNTER — Encounter: Payer: Self-pay | Admitting: Internal Medicine

## 2021-09-21 ENCOUNTER — Ambulatory Visit (INDEPENDENT_AMBULATORY_CARE_PROVIDER_SITE_OTHER): Payer: Medicare HMO | Admitting: Internal Medicine

## 2021-09-21 ENCOUNTER — Other Ambulatory Visit (HOSPITAL_COMMUNITY)
Admission: RE | Admit: 2021-09-21 | Discharge: 2021-09-21 | Disposition: A | Discharge status: Home / Self Care | Payer: Medicare HMO | Source: Ambulatory Visit | Attending: Internal Medicine | Admitting: Internal Medicine

## 2021-09-21 ENCOUNTER — Other Ambulatory Visit: Payer: Self-pay

## 2021-09-21 VITALS — BP 128/76 | HR 69 | Temp 97.9°F | Resp 16 | Ht 71.0 in | Wt 251.6 lb

## 2021-09-21 DIAGNOSIS — Z6835 Body mass index (BMI) 35.0-35.9, adult: Secondary | ICD-10-CM

## 2021-09-21 DIAGNOSIS — N760 Acute vaginitis: Secondary | ICD-10-CM | POA: Insufficient documentation

## 2021-09-21 DIAGNOSIS — R0989 Other specified symptoms and signs involving the circulatory and respiratory systems: Secondary | ICD-10-CM | POA: Diagnosis not present

## 2021-09-21 DIAGNOSIS — N898 Other specified noninflammatory disorders of vagina: Secondary | ICD-10-CM

## 2021-09-21 MED ORDER — FLUCONAZOLE 150 MG PO TABS: ORAL_TABLET | ORAL | 0 refills | Status: DC

## 2021-09-21 MED ORDER — NYSTATIN 100000 UNIT/GM EX CREA: TOPICAL_CREAM | Freq: Two times a day (BID) | CUTANEOUS | 0 refills | Status: DC

## 2021-09-21 NOTE — Progress Notes (Signed)
Patient ID: Kathleen Everett, female   DOB: [birth year], 50 y.o.   MRN: [id number]   Subjective:    Patient ID: Kathleen Everett, female    DOB: [birth year], 50 y.o.   MRN: [id number]  This visit occurred during the SARS-CoV-2 public health emergency.  Safety protocols were in place, including screening questions prior to the visit, additional usage of staff PPE, and extensive cleaning of exam room while observing appropriate contact time as indicated for disinfecting solutions.   Patient here for work in appt.   Chief Complaint  Patient presents with   Vaginitis   .   HPI Work in appt for possible yeast infection.  Reports having persistent vaginal irritation.  States feels like a "cut" in her vagina.  Toilet paper irritates.  Some white discharge.  Itchy - around rectum.  Eating activia.  No increased abdominal pain.  No urinary symptoms.  Also reports over the last month - runny nose.  Discussed trial of astelin nasal spray.  No increased cough or congestion.  Breathing stable.  Tolerating ozempic.  Weight is down.     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   Endometriosis    Generalized anxiety disorder 05/01/2015   Has previously seen psychiatry. Having some issues with what she feels is related to stress and anxiety   GERD (gastroesophageal reflux disease)    Hemorrhoids 12/23/2020   Hidradenitis    History of ovarian cyst    Hot flashes 03/12/2015   Hypercholesteremia 01/10/2014    Off lipitor. Elevated cholesterol. Labs discussed. Restart lipitor. Follow lipid panel and liver function tests.   Hyperglycemia 06/24/2019   Major depressive disorder 05/01/2015   Migraine headaches 01/10/2014   Has seen neurology. Topamax.   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 Level is 21.6   Past Surgical History:  Procedure Laterality Date   ABDOMINAL HYSTERECTOMY  03/27/07   laparoscopic   BREAST EXCISIONAL BIOPSY Left 2004   fibroadenoma   CESAREAN SECTION     1999/2004 twins   chiari decompression     COLONOSCOPY WITH PROPOFOL N/A 04/28/2015   Procedure: COLONOSCOPY WITH PROPOFOL;  Surgeon: Manya Silvas, MD;  Location: [facility] ENDOSCOPY;  Service: Endoscopy;  Laterality: N/A;   COLONOSCOPY WITH PROPOFOL N/A 03/19/2021   Procedure: COLONOSCOPY WITH PROPOFOL;  Surgeon: Lesly Rubenstein, MD;  Location: [facility] ENDOSCOPY;  Service: Endoscopy;  Laterality: N/A;   CYSTOSCOPY  bladder ulcers   ESOPHAGOGASTRODUODENOSCOPY N/A 04/28/2015   Procedure: ESOPHAGOGASTRODUODENOSCOPY (EGD);  Surgeon: Manya Silvas, MD;  Location[facility] ENDOSCOPY;  Service: Endoscopy;  Laterality: N/A;   ESOPHAGOGASTRODUODENOSCOPY (EGD) WITH PROPOFOL N/A 03/19/2021   Procedure: ESOPHAGOGASTRODUODENOSCOPY (EGD) WITH PROPOFOL;  Surgeon: Lesly Rubenstein, MD;  Location: [facility] ENDOSCOPY;  Service: Endoscopy;  Laterality: N/A;   HERNIA REPAIR Right 2003   inguinal   INCISION AND DRAINAGE HIP Right 05/11/2020   Procedure: IRRIGATION AND DEBRIDEMENT HIP;   Surgeon: Hessie Knows, MD;  Location: [facility] ORS;  Service: Orthopedics;  Laterality: Right;   OVARIAN CYST REMOVAL  2001   right   TEMPOROMANDIBULAR JOINT SURGERY  2000   TOTAL HIP ARTHROPLASTY Right 12/28/2019   Procedure: TOTAL HIP ARTHROPLASTY ANTERIOR APPROACH;  Surgeon: Hessie Knows, MD;  Location: [facility] ORS;  Service: Orthopedics;  Laterality: Right;   TUBAL LIGATION     Bilateral   Family History  Problem Relation Age of Onset   Hyperlipidemia Mother    Anxiety disorder Mother    Atrial fibrillation Mother    Hypertension Mother    Memory loss Mother    Anxiety disorder Father    Stroke Father    Hyperlipidemia Father    Hypertension Father    Congestive Heart Failure Father    Depression Father    Hyperlipidemia Brother    Colon cancer Paternal Aunt    Dementia Paternal Aunt    Dementia Maternal Grandmother    Dementia Paternal Grandmother    ADD / ADHD Daughter    Breast cancer Neg Hx    Social History   Socioeconomic History   Marital status: Married    Spouse name: Not on file   Number of children: 3   Years of education: 13   Highest education level: Some college, no degree  Occupational History   Occupation: Disability  Tobacco Use   Smoking status: Never   Smokeless tobacco: Never  Vaping Use   Vaping Use: Never used  Substance and Sexual Activity   Alcohol use: Not Currently    Alcohol/week: 0.0 standard drinks   Drug use: No   Sexual activity: Yes    Birth control/protection: None  Other Topics Concern   Not on file  Social History Narrative   Patient drinks 2-3 cups of caffeine daily.   Patient is right handed.       Social Hx:   Current living situation- [place]. Living with husband and 3 kids   Born and Raised- [place].raised by both parents.    Siblings- 1 brother who is 7 yrs older than her   Legal issues- none   Married for 24 yrs. Currently unemployed since 2011 when she hurt her back. HS grad and CNA and phlebotomy.    Social  Determinants of Health   Financial Resource Strain: Low Risk    Difficulty of Paying Living Expenses: Not hard at all  Food Insecurity: Not on file  Transportation Needs: Not on file  Physical Activity: Not on file  Stress: Not on file  Social Connections: Not on file     Review of Systems  Constitutional:  Negative for fever.       Has adjusted diet.  Tolerating ozempic.  Lost weight.   HENT:  Negative for sinus pressure.        Runny nose.   Respiratory:  Negative for cough, chest tightness and shortness of breath.   Cardiovascular:  Negative for chest pain and palpitations.  Gastrointestinal:  Negative for  abdominal pain, nausea and vomiting.  Genitourinary:  Negative for difficulty urinating and dysuria.       Vaginal and rectal irritation as outlined.   Musculoskeletal:  Positive for back pain. Negative for myalgias.       Chronic back pain/hip pain.   Skin:  Negative for color change and rash.  Neurological:  Negative for dizziness, light-headedness and headaches.  Psychiatric/Behavioral:  Negative for agitation and dysphoric mood.       Objective:     BP 128/76   Pulse 69   Temp 97.9 F (36.6 C)   Resp 16   Wt 251 lb 9.6 oz (114.1 kg)   LMP 12/08/2006   SpO2 99%   BMI 35.09 kg/m  Wt Readings from Last 3 Encounters:  09/21/21 251 lb 9.6 oz (114.1 kg)  08/28/21 258 lb (117 kg)  07/17/21 269 lb (122 kg)    Physical Exam Vitals reviewed.  Constitutional:      General: She is not in acute distress.    Appearance: Normal appearance.  HENT:     Head: Normocephalic and atraumatic.     Right Ear: External ear normal.     Left Ear: External ear normal.  Eyes:     General: No scleral icterus.       Right eye: No discharge.        Left eye: No discharge.     Conjunctiva/sclera: Conjunctivae normal.  Neck:     Thyroid: No thyromegaly.  Cardiovascular:     Rate and Rhythm: Normal rate and regular rhythm.  Pulmonary:     Effort: No respiratory distress.      Breath sounds: Normal breath sounds. No wheezing.  Abdominal:     General: Bowel sounds are normal.     Palpations: Abdomen is soft.     Tenderness: There is no abdominal tenderness.  Genitourinary:    Comments: Normal external genitalia.  Some minimal erythema.  Vaginal vault without lesions.  Could not appreciate any adnexal masses or tenderness.   Musculoskeletal:        General: No swelling or tenderness.     Cervical back: Neck supple. No tenderness.  Lymphadenopathy:     Cervical: No cervical adenopathy.  Skin:    Findings: No erythema or rash.  Neurological:     Mental Status: She is alert.  Psychiatric:        Mood and Affect: Mood normal.        Behavior: Behavior normal.     Outpatient Encounter Medications as of 09/21/2021  Medication Sig   fluconazole (DIFLUCAN) 150 MG tablet Take one tablet x 1 day and then may repeat x 1 in 3 days if persistent symptoms.   acetaminophen (TYLENOL) 500 MG tablet Take 500-1,000 mg by mouth 2 (two) times daily as needed for mild pain.    AIMOVIG 140 MG/ML SOAJ Inject 140 mg into the skin every 28 (twenty-eight) days.   atorvastatin (LIPITOR) 10 MG tablet Take 1 tablet (10 mg total) by mouth daily.   clobetasol (TEMOVATE) 0.05 % external solution Apply to aa's scalp QD PRN. Avoid the face, groin, and axilla. Avoid applying to face, groin, and axilla. Use as directed. Risk of skin atrophy with long-term use reviewed.   clotrimazole-betamethasone (LOTRISONE) cream Apply 1 application topically 2 (two) times daily.   cyanocobalamin (,VITAMIN B-12,) 1000 MCG/ML injection INJECT 1 ML INTRAMUSCULARLY ONCE EVERY MONTH   diclofenac sodium (VOLTAREN) 1 % GEL Apply 1 application topically 4 (four) times daily  as needed (pain.).    erythromycin with ethanol (THERAMYCIN) 2 % external solution Apply topically 2 (two) times daily. As needed to affected areas of skin (pustules and bumps)   esomeprazole (NEXIUM) 40 MG capsule Take 1 capsule by mouth once  daily   estradiol (ESTRACE VAGINAL) 0.1 MG/GM vaginal cream One applicator q hs x 5 nights and then 2x/week. (Patient taking differently: Place 1 Applicatorful vaginally once a week.)   gabapentin (NEURONTIN) 400 MG capsule Take by mouth.   [EXPIRED] ibuprofen (ADVIL) 800 MG tablet Take by mouth.   ketoconazole (NIZORAL) 2 % shampoo Shampoo into scalp let sit 5-10 mins then wash out. Use 3d/wk.   lactase (RA DAIRY AID) 3000 units tablet Take 9,000 Units by mouth 3 (three) times daily as needed (with dairy consumption).    lurasidone (LATUDA) 20 MG TABS tablet Take 1 tablet (20 mg total) by mouth daily.   methocarbamol (ROBAXIN) 500 MG tablet Take 500 mg by mouth 4 (four) times daily.   methylphenidate (RITALIN) 20 MG tablet Take 1 tablet (20 mg total) by mouth daily.   methylphenidate (RITALIN) 20 MG tablet Take 1 tablet (20 mg total) by mouth daily.   mupirocin ointment (BACTROBAN) 2 % Apply to healing wound TID.   naloxone (NARCAN) 2 MG/2ML injection Inject into the vein as needed.   nystatin cream (MYCOSTATIN) Apply topically 2 (two) times daily.   Oxycodone HCl 10 MG TABS Take 10 mg by mouth 5 (five) times daily.    rizatriptan (MAXALT) 10 MG tablet Take 10 mg by mouth as needed for migraine. May repeat in 2 hours if needed   Semaglutide, 2 MG/DOSE, 8 MG/3ML SOPN Inject 2 mg as directed once a week.   sertraline (ZOLOFT) 100 MG tablet Take 2 tablets (200 mg total) by mouth daily.   Simethicone (GAS RELIEF PO) Take 125 mg by mouth 2 (two) times daily as needed for flatulence. Anti gas   tacrolimus (PROTOPIC) 0.1 % ointment Apply topically 2 (two) times daily.   triamcinolone cream (KENALOG) 0.1 % APPLY  CREAM EXTERNALLY TWICE DAILY TO AFFECTED AREA ON  FINGERS   Ubrogepant (UBRELVY) 100 MG TABS Take 100 mg by mouth 2 (two) times daily as needed.   [DISCONTINUED] nystatin cream (MYCOSTATIN) APPLY TOPICALLY TWICE DAILY   No facility-administered encounter medications on file as of 09/21/2021.      Lab Results  Component Value Date   WBC 5.4 07/17/2021   HGB 13.5 07/17/2021   HCT 41.3 07/17/2021   PLT 233.0 07/17/2021   GLUCOSE 78 07/17/2021   CHOL 264 (H) 07/17/2021   TRIG 185.0 (H) 07/17/2021   HDL 45.50 07/17/2021   LDLDIRECT 181.0 06/26/2020   LDLCALC 181 (H) 07/17/2021   ALT 17 07/17/2021   AST 16 07/17/2021   NA 138 07/17/2021   K 4.5 07/17/2021   CL 100 07/17/2021   CREATININE 0.81 07/17/2021   BUN 12 07/17/2021   CO2 30 07/17/2021   TSH 1.59 07/17/2021   INR 1.1 12/03/2019   HGBA1C 5.5 07/17/2021    NM Bone Scan 3 Phase  Result Date: 08/22/2021 CLINICAL DATA:  RIGHT hip pain, history of RIGHT total hip arthroplasty in February 2021 EXAM: NUCLEAR MEDICINE 3-PHASE BONE SCAN TECHNIQUE: Radionuclide angiographic images, immediate static blood pool images, and 3-hour delayed static images were obtained of the hips after intravenous injection of radiopharmaceutical. Concomitant CT imaging was performed. Fused SPECT CT data sets are reviewed in 3 planes. RADIOPHARMACEUTICALS:  20.1 mCi Tc-4m MDP  IV COMPARISON:  Pelvic radiograph 03/17/2021, concurrent 08/21/2021 FINDINGS: Vascular phase: Minimally increased blood flow to the soft tissues lateral to the RIGHT hip Blood pool phase: Symmetric blood pool at both hips Delayed phase: Photopenic defect at RIGHT hip from hip prosthesis. Small amount of tracer uptake is seen at the RIGHT greater trochanter, nonspecific. No additional uptake of tracer is seen along the femoral component of the RIGHT hip prosthesis to suggest aseptic loosening or infection. Mild degenerative type uptake of tracer at the LEFT hip joint. No abnormal pelvic localization of tracer seen. IMPRESSION: No scintigraphic evidence of aseptic loosening of the RIGHT hip prosthesis. Electronically Signed   By: Lavonia Dana M.D.   On: 08/22/2021 18:33       Assessment & Plan:   Problem List Items Addressed This Visit     BMI 35.0-35.9,adult    Has lost  weight.  Tolerating ozempic.  Follow.       Runny nose    Runny nose as outlined.  Trial of astelin nasal spray.        Vaginal irritation    Vaginal irritation as outlined.  Also some irritation around the rectum.  Wet prep sent.  Treat with nystatin cream.  Follow.  If persistent issues, may require gyn evaluation.       Other Visit Diagnoses     Acute vaginitis    -  Primary   Relevant Orders   Cervicovaginal ancillary only( [place]) (Completed)        [name], MD

## 2021-09-24 LAB — CERVICOVAGINAL ANCILLARY ONLY
Bacterial Vaginitis (gardnerella): NEGATIVE
Candida Glabrata: NEGATIVE
Candida Vaginitis: NEGATIVE
Comment: NEGATIVE
Comment: NEGATIVE
Comment: NEGATIVE

## 2021-09-25 ENCOUNTER — Telehealth (HOSPITAL_COMMUNITY): Payer: Self-pay

## 2021-09-25 MED ORDER — AZELASTINE HCL 0.1 % NA SOLN: 1.0000 | Freq: Two times a day (BID) | NASAL | 1 refills | Status: DC

## 2021-09-25 NOTE — Telephone Encounter (Signed)
Completed a PA on Patient's Lurasidone (Latuda) 20mg  tablet Through [employer]. SENT FOR REVIEW - AWAITING DETERMINATION

## 2021-09-25 NOTE — Telephone Encounter (Signed)
Rx sent in for astelin nasal spray

## 2021-09-27 ENCOUNTER — Telehealth (HOSPITAL_COMMUNITY): Payer: Self-pay | Admitting: *Deleted

## 2021-09-27 NOTE — Telephone Encounter (Signed)
PA appeal for Latuda 40 mg tablets has been denied by [facility] due to pt not having dx of Bi-polar or schizophrenia.

## 2021-09-30 ENCOUNTER — Encounter: Payer: Self-pay | Admitting: Internal Medicine

## 2021-09-30 DIAGNOSIS — R0989 Other specified symptoms and signs involving the circulatory and respiratory systems: Secondary | ICD-10-CM | POA: Insufficient documentation

## 2021-09-30 NOTE — Assessment & Plan Note (Signed)
Has lost weight.  Tolerating ozempic.  Follow.

## 2021-09-30 NOTE — Assessment & Plan Note (Signed)
Runny nose as outlined.  Trial of astelin nasal spray.

## 2021-09-30 NOTE — Assessment & Plan Note (Signed)
Vaginal irritation as outlined.  Also some irritation around the rectum.  Wet prep sent.  Treat with nystatin cream.  Follow.  If persistent issues, may require gyn evaluation.

## 2021-10-03 ENCOUNTER — Ambulatory Visit: Payer: Medicare HMO | Admitting: Podiatry

## 2021-10-04 ENCOUNTER — Telehealth (HOSPITAL_COMMUNITY): Payer: Self-pay

## 2021-10-04 ENCOUNTER — Telehealth (HOSPITAL_BASED_OUTPATIENT_CLINIC_OR_DEPARTMENT_OTHER): Payer: Medicare HMO | Admitting: Psychiatry

## 2021-10-04 ENCOUNTER — Other Ambulatory Visit: Payer: Self-pay

## 2021-10-04 DIAGNOSIS — F331 Major depressive disorder, recurrent, moderate: Secondary | ICD-10-CM | POA: Diagnosis not present

## 2021-10-04 DIAGNOSIS — F411 Generalized anxiety disorder: Secondary | ICD-10-CM | POA: Diagnosis not present

## 2021-10-04 MED ORDER — SERTRALINE HCL 100 MG PO TABS: 200.0000 mg | ORAL_TABLET | Freq: Every day | ORAL | 0 refills | Status: DC

## 2021-10-04 MED ORDER — METHYLPHENIDATE HCL 20 MG PO TABS: 20.0000 mg | ORAL_TABLET | Freq: Two times a day (BID) | ORAL | 0 refills | Status: DC

## 2021-10-04 MED ORDER — QUETIAPINE FUMARATE ER 50 MG PO TB24: 50.0000 mg | ORAL_TABLET | Freq: Every day | ORAL | 1 refills | Status: DC

## 2021-10-04 NOTE — Telephone Encounter (Signed)
[employer] PRESCRIPTION COVERAGE  DENIED   LATUDA 20MG  TABLET

## 2021-10-04 NOTE — Progress Notes (Signed)
Virtual Visit via Video Note  I connected with Kathleen Everett on 10/04/21 at  9:45 AM EST by a video enabled telemedicine application and verified that I am speaking with the correct person using two identifiers.  Location: Patient: home Provider: office   I discussed the limitations of evaluation and management by telemedicine and the availability of in person appointments. The patient expressed understanding and agreed to proceed.  History of Present Illness: Kathleen Everett is relaxing at a [place] while he does a job. She was not able to fill Latuda due to insurance problems. She has able to take the increased Ritalin does. Kathleen Everett feels more tired in the afternoon when it wears off. She tried taking 15mg  in the AM and then 5mg  in the afternoon and it helped so that she wasn't so fatigued. It does help her focus a little more than before. She is still having difficulty getting her words or when she is trying to explain things. It seemed a little better on 20mg . Some days her husband tells her she is more talkative but she is not sure if it on days she doesn't take Ritalin. Her anxiety is ongoing and she worries too much. On days she doesn't take her meds she is more emotional. Her depression is ongoing. Her pain level and the weather do contribute to her mood. Her sleep is variable. She has been out walking more. She denies SI/HI.    Observations/Objective: Psychiatric Specialty Exam: ROS  Last menstrual period 12/08/2006.There is no height or weight on file to calculate BMI.  General Appearance: Casual  Eye Contact:  Fair  Speech:  Clear and Coherent and Slow  Volume:  Normal  Mood:  Anxious and Depressed  Affect:  Congruent  Thought Process:  Coherent and Descriptions of Associations: Circumstantial  Orientation:  Full (Time, Place, and Person)  Thought Content:  Logical  Suicidal Thoughts:  No  Homicidal Thoughts:  No  Memory:  Immediate;   Good  Judgement:  Good  Insight:   Good  Psychomotor Activity:  Normal  Concentration:  Concentration: Poor  Recall:  Poor  Fund of Knowledge:  Good  Language:  Good  Akathisia:  No  Handed:  Right  AIMS (if indicated):     Assets:  Communication Skills Desire for Improvement Financial Resources/Insurance Housing Intimacy Resilience Social Support Talents/Skills Transportation Vocational/Educational  ADL's:  Intact  Cognition:  WNL  Sleep:        Assessment and Plan:  - increase Ritalin 20mg  po BID for mood augmentation  - start Seroquel XR 50mg  po qhs for MDD and GAD  1. MDD (major depressive disorder), recurrent episode, moderate (HCC) - methylphenidate (RITALIN) 20 MG tablet; Take 1 tablet (20 mg total) by mouth 2 (two) times daily with breakfast and lunch.  Dispense: 60 tablet; Refill: 0 - methylphenidate (RITALIN) 20 MG tablet; Take 1 tablet (20 mg total) by mouth 2 (two) times daily with breakfast and lunch.  Dispense: 60 tablet; Refill: 0 - sertraline (ZOLOFT) 100 MG tablet; Take 2 tablets (200 mg total) by mouth daily.  Dispense: 180 tablet; Refill: 0 - QUEtiapine (SEROQUEL XR) 50 MG TB24 24 hr tablet; Take 1 tablet (50 mg total) by mouth at bedtime.  Dispense: 30 tablet; Refill: 1  2. Generalized anxiety disorder - sertraline (ZOLOFT) 100 MG tablet; Take 2 tablets (200 mg total) by mouth daily.  Dispense: 180 tablet; Refill: 0 - will order EKG at next visit  - reviewed labs from  07/17/21: lipids elevated and she is working with her PCP regarding management     Follow Up Instructions: In 2-4 weeks or sooner if needed   I discussed the assessment and treatment plan with the patient. The patient was provided an opportunity to ask questions and all were answered. The patient agreed with the plan and demonstrated an understanding of the instructions.   The patient was advised to call back or seek an in-person evaluation if the symptoms worsen or if the condition fails to improve as  anticipated.  I provided 20 minutes of non-face-to-face time during this encounter.   Charlcie Cradle, MD

## 2021-10-15 IMAGING — MG DIGITAL SCREENING BILAT W/ TOMO W/ CAD
6 of 10 series · 6 of 30 positions shown · non-contrast
Comparison: Previous exam(s).

CLINICAL DATA: Screening.

EXAM:
DIGITAL SCREENING BILATERAL MAMMOGRAM WITH TOMO AND CAD

[R MLO synth-2D]
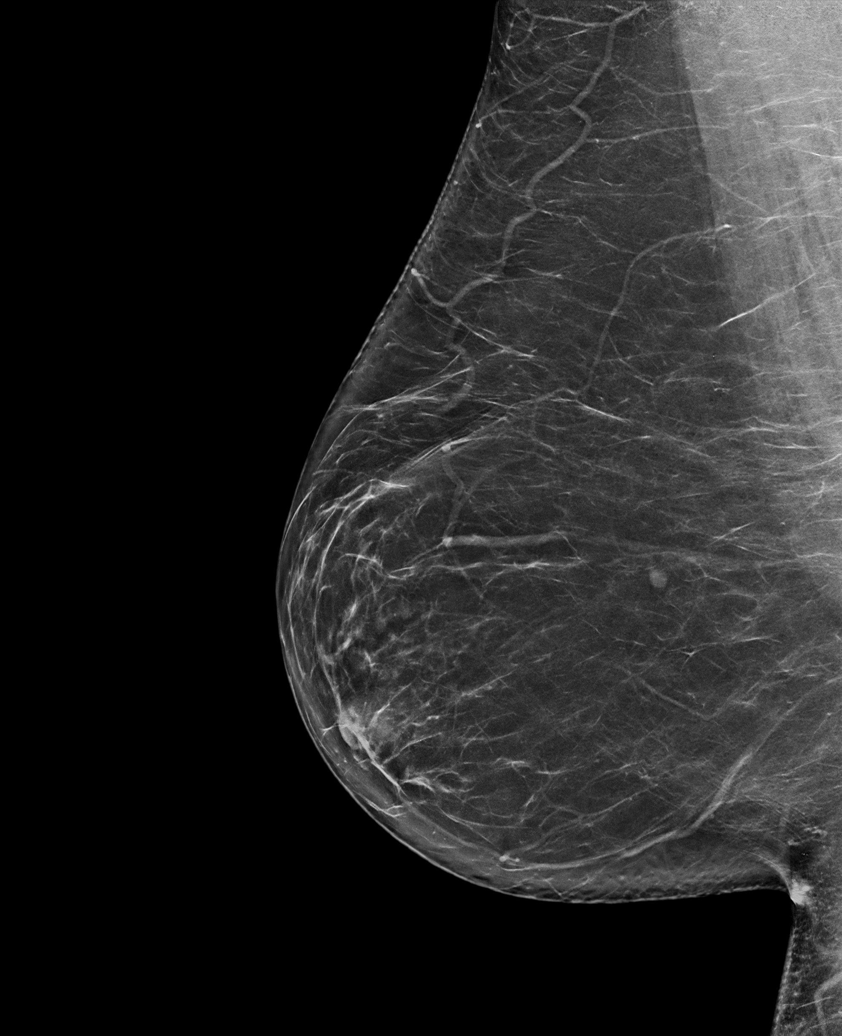

[L MLO synth-2D]
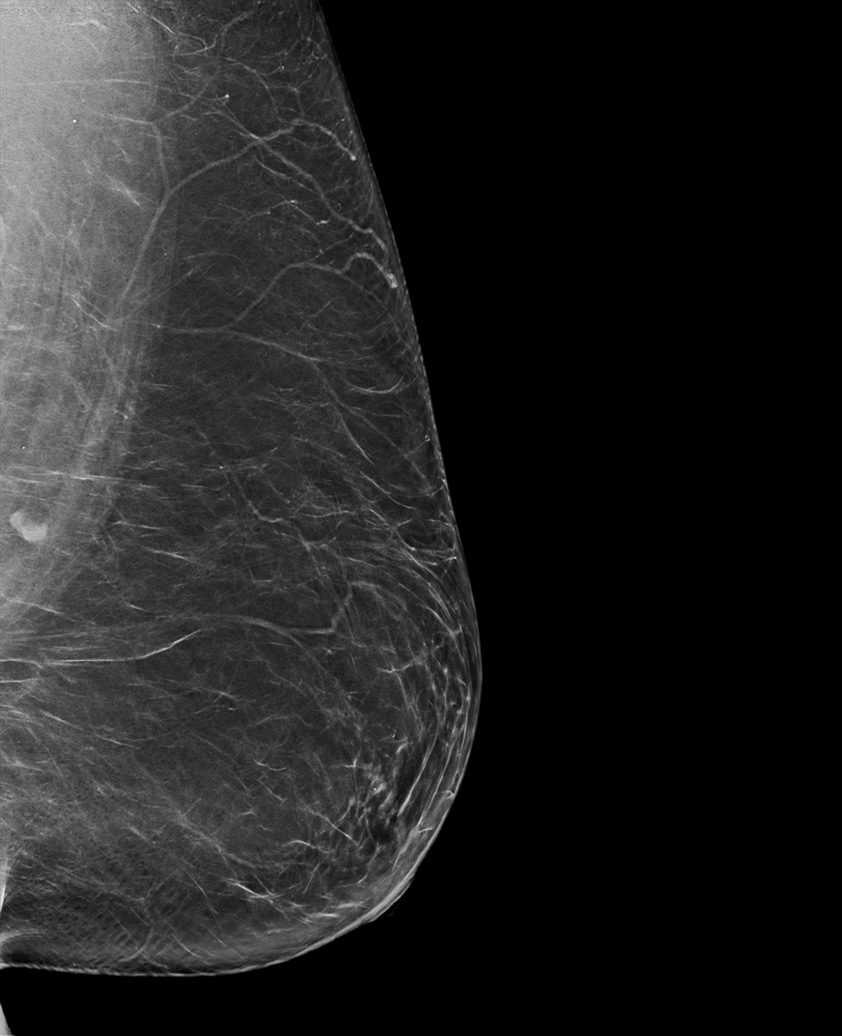

[L CC synth-2D]
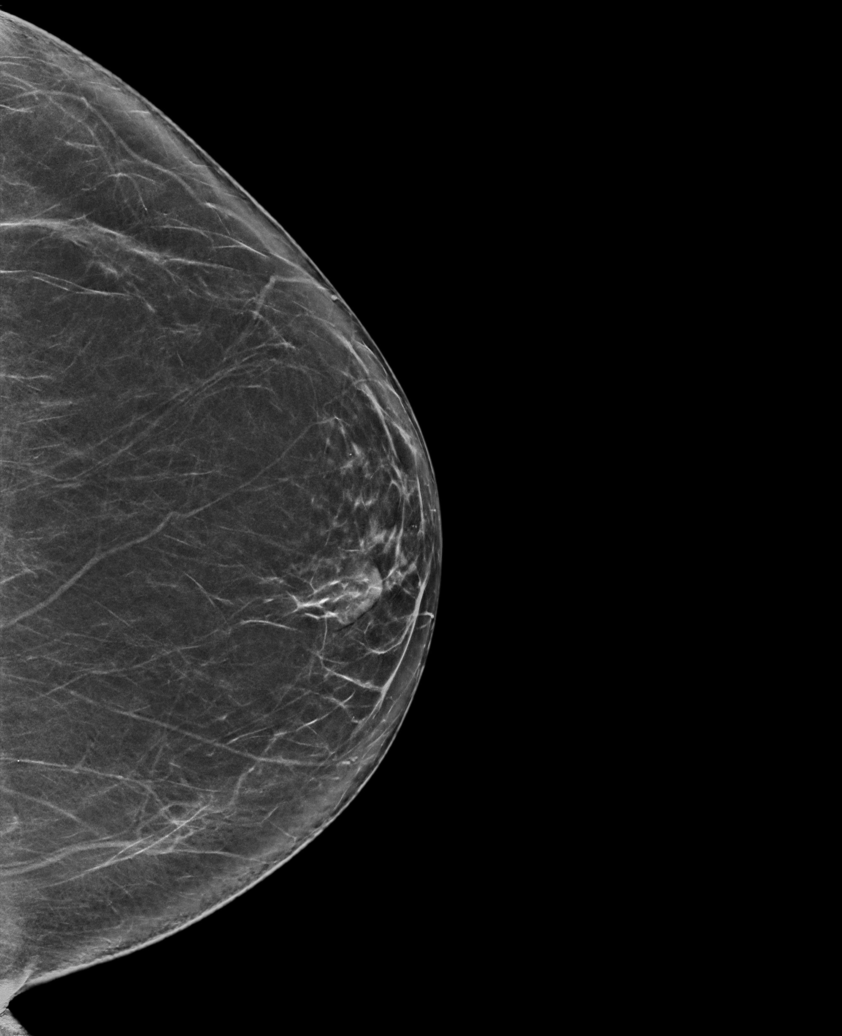

[R XCCL synth-2D]
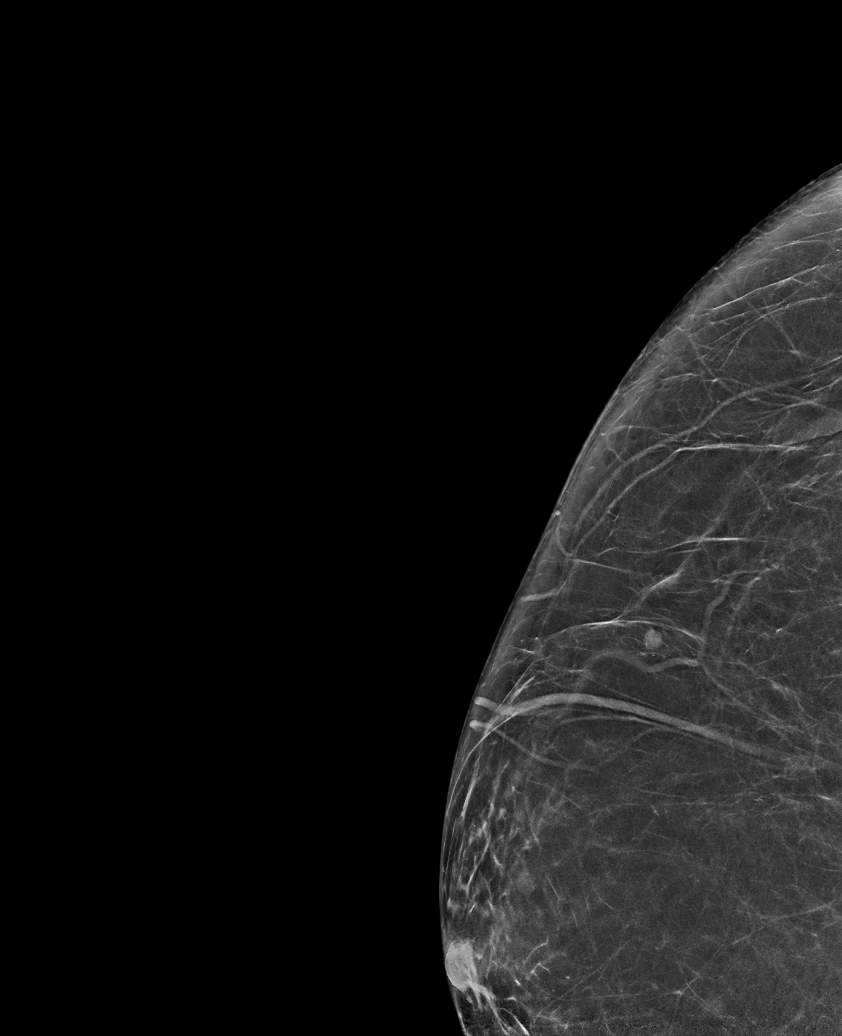

[R CC synth-2D]
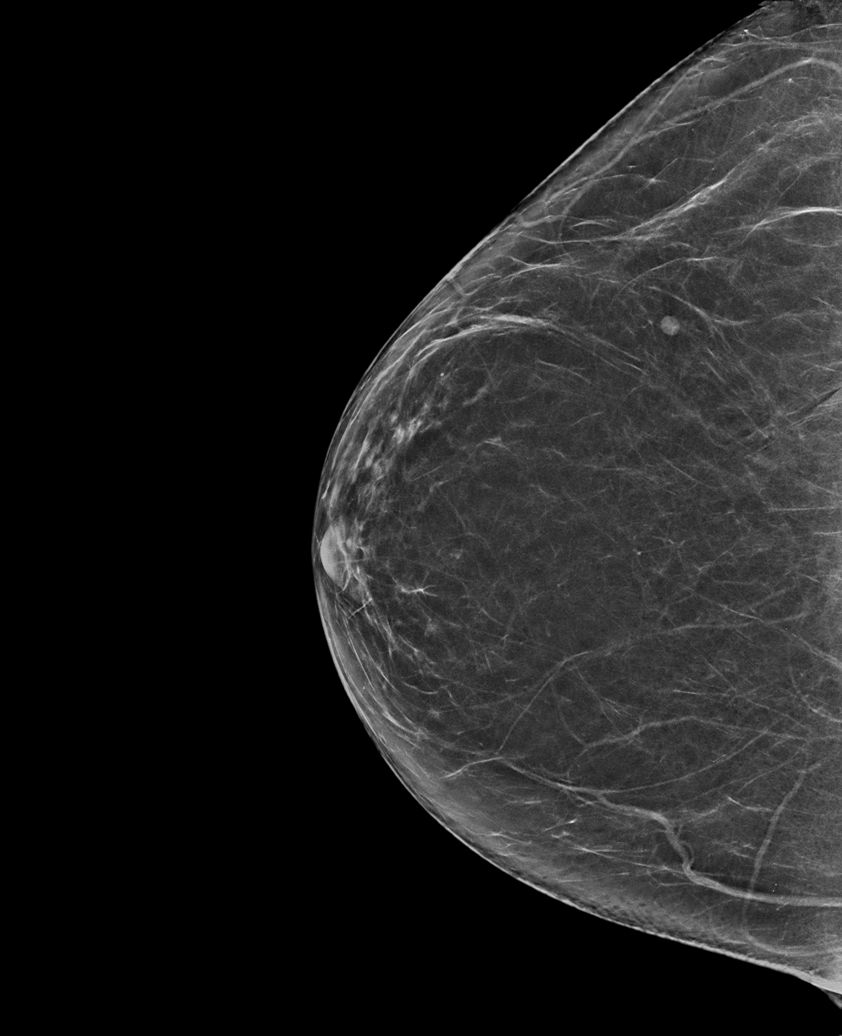

[R XCCL tomo · tomo slice 37/73.0]
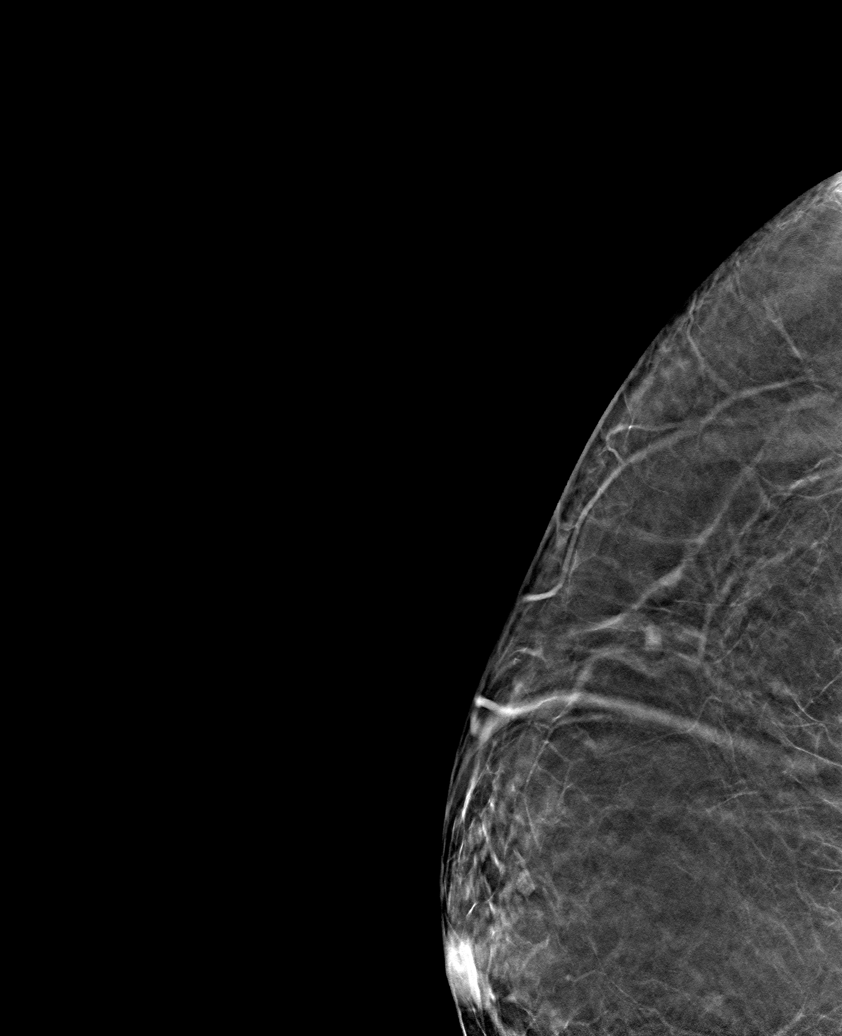

[6 of 30 positions shown; findings below may reference images not displayed]

ACR Breast Density Category b: There are scattered areas of
fibroglandular density.
FINDINGS: There are no findings suspicious for malignancy. Images were
processed with CAD.
IMPRESSION: No mammographic evidence of malignancy. A result letter of this
screening mammogram will be mailed directly to the patient.

RECOMMENDATION:
Screening mammogram in one year. (Code:[id number])

BI-RADS CATEGORY  1: Negative.

## 2021-10-19 ENCOUNTER — Encounter: Payer: Self-pay | Admitting: Internal Medicine

## 2021-10-19 DIAGNOSIS — N898 Other specified noninflammatory disorders of vagina: Secondary | ICD-10-CM

## 2021-11-02 NOTE — Telephone Encounter (Signed)
Order placed for gyn referral.  

## 2021-11-13 ENCOUNTER — Ambulatory Visit (INDEPENDENT_AMBULATORY_CARE_PROVIDER_SITE_OTHER): Payer: Medicare HMO | Admitting: Pharmacist

## 2021-11-13 VITALS — Wt 238.6 lb

## 2021-11-13 DIAGNOSIS — E7800 Pure hypercholesterolemia, unspecified: Secondary | ICD-10-CM

## 2021-11-13 DIAGNOSIS — F325 Major depressive disorder, single episode, in full remission: Secondary | ICD-10-CM

## 2021-11-13 DIAGNOSIS — Z6833 Body mass index (BMI) 33.0-33.9, adult: Secondary | ICD-10-CM

## 2021-11-13 NOTE — Patient Instructions (Signed)
Visit Information  Following are the goals we discussed today:  Patient Goals/Self-Care Activities Over the next 90 days, patient will:  - take medications as prescribed collaborate with provider on medication access solutions engage in dietary modifications by moderating portion sizes        Plan: Goals of care met. Closing CCM cas   Catie Darnelle Maffucci, PharmD, [place], CPP Clinical Pharmacist [place] at [facility] [phone]     Please call the care guide team at [phone] if you need to cancel or reschedule your appointment.   Patient verbalizes understanding of instructions provided today and agrees to view in [place].

## 2021-11-13 NOTE — Chronic Care Management (AMB) (Signed)
Chronic Care Management CCM Pharmacy Note  11/13/2021 Name:  Kathleen Everett MRN:  [id number] DOB:  January 14, 1971  Summary: - Tolerating Ozempic 2 mg dose well.   Recommendations/Changes made from today's visit: - Recommended to continue current regimen at this time.   Subjective: Kathleen Everett is an 50 y.o. year old female who is a primary patient of [name], MD.  The CCM team was consulted for assistance with disease management and care coordination needs.    Engaged with patient by telephone for follow up visit for pharmacy case management and/or care coordination services.   Objective:  Medications Reviewed Today     Reviewed by Charlcie Cradle, MD (Psychiatrist) on 10/04/21 at [phone]  Med List Status: <None>   Medication Order Taking? Sig Documenting Provider Last Dose Status Informant  acetaminophen (TYLENOL) 500 MG tablet [id number] Yes Take 500-1,000 mg by mouth 2 (two) times daily as needed for mild pain.  [provider] Taking Active Self           Med Note Darnelle Maffucci, Arville Lime   Wed Mar 28, 2021  1:49 PM) Taking at least 1-2 times daily  AIMOVIG 140 MG/ML SOAJ [id number] Yes Inject 140 mg into the skin every 28 (twenty-eight) days. [provider] Taking Active   atorvastatin (LIPITOR) 10 MG tablet [id number] Yes Take 1 tablet (10 mg total) by mouth daily. [name], MD Taking Active   azelastine (ASTELIN) 0.1 % nasal spray [id number] Yes Place 1 spray into both nostrils 2 (two) times daily. Use in each nostril as directed [name], MD Taking Active   clobetasol (TEMOVATE) 0.05 % external solution [id number] Yes Apply to aa's scalp QD PRN. Avoid the face, groin, and axilla. Avoid applying to face, groin, and axilla. Use as directed. Risk of skin atrophy with long-term use reviewed. Laurence Ferrari, [place], MD Taking Active   clotrimazole-betamethasone Donalynn Furlong) cream [id number] Yes Apply 1 application topically 2 (two) times daily.  Criselda Peaches, DPM Taking Active   cyanocobalamin (,VITAMIN B-12,) 1000 MCG/ML injection [id number] Yes INJECT 1 ML INTRAMUSCULARLY ONCE EVERY MONTH [name], MD Taking Active   diclofenac sodium (VOLTAREN) 1 % GEL [id number] Yes Apply 1 application topically 4 (four) times daily as needed (pain.).  [provider] Taking Active Self           Med Note [name]   Wed Mar 28, 2021  1:53 PM) PRN  erythromycin with ethanol [facility]) 2 % external solution [id number] Yes Apply topically 2 (two) times daily. As needed to affected areas of skin (pustules and bumps) Moye, [place], MD Taking Active   esomeprazole (NEXIUM) 40 MG capsule [id number] Yes Take 1 capsule by mouth once daily [name], MD Taking Active   estradiol (ESTRACE VAGINAL) 0.1 MG/GM vaginal cream [id number] Yes One applicator q hs x 5 nights and then 2x/week.  Patient taking differently: Place 1 Applicatorful vaginally once a week.   [name], MD Taking Active   fluconazole (DIFLUCAN) 150 MG tablet [id number] Yes Take one tablet x 1 day and then may repeat x 1 in 3 days if persistent symptoms. [name], MD Taking Active   gabapentin (NEURONTIN) 400 MG capsule [id number]  Take by mouth. [provider]  Expired 09/22/21 2359   ketoconazole (NIZORAL) 2 % shampoo [id number] Yes Shampoo into scalp let sit 5-10 mins then wash out. Use 3d/wk. Moye, [place], MD Taking Active   lactase (RA DAIRY AID) 3000 units tablet [id number] Yes Take 9,000 Units  by mouth 3 (three) times daily as needed (with dairy consumption).  [provider] Taking Active Self  lurasidone (LATUDA) 20 MG TABS tablet [id number] No Take 1 tablet (20 mg total) by mouth daily.  Patient not taking: Reported on 10/04/2021   Charlcie Cradle, MD Not Taking Active   methocarbamol (ROBAXIN) 500 MG tablet [id number] Yes Take 500 mg by mouth 4 (four) times daily. [provider] Taking Active            Med Note  [name]   Wed Mar 28, 2021  1:57 PM) Using ~2-4 times daily  methylphenidate (RITALIN) 20 MG tablet [id number] Yes Take 1 tablet (20 mg total) by mouth daily.  Patient taking differently: Take 20 mg by mouth daily. Has been taking 57m qAM and 566mqLDimas AguasSaAndria FramesMD Taking Active Self  methylphenidate (RITALIN) 20 MG tablet [id number] Take 1 tablet (20 mg total) by mouth daily.  Patient taking differently: Take 20 mg by mouth daily. Take 1552mo qAM and 5mg56mM [place] Bailiff Taking Active Self  mupirocin ointment (BACTROBAN) 2 % [id number] Apply to healing wound TID. Moye, [place] Taking Active   naloxone (NARNorthern Light HealthMG/2ML injection [id number] Inject into the vein as needed. [provider] Taking Active   nystatin cream (MYCOSTATIN) [id number] Apply topically 2 (two) times daily. ScotEinar Pheasant Taking Active   Oxycodone HCl 10 MG TABS [id number] Take 10 mg by mouth 5 (five) times daily.  [provider] Taking Active Self           Med Note (HARKenton KingfisherLIKathrine Haddock 12, 2021  8:20 AM)    rizatriptan (MAXALT) 10 MG tablet [id number] Take 10 mg by mouth as needed for migraine. May repeat in 2 hours if needed [provider] Taking Active Self           Med Note (HORNadara Eaton 23, 2021  9:00 AM)    Semaglutide, 2 MG/DOSE, 8 MG/3ML SOPN [id number] Inject 2 mg as directed once a week. ScotEinar Pheasant Taking Active   sertraline (ZOLOFT) 100 MG tablet [id number] 2 tablets (200 mg total) by mouth daily. AgarCharlcie Cradle  Active   Simethicone (GAS RELIEF PO) [id number] Take 125 mg by mouth 2 (two) times daily as needed for flatulence. Anti gas [provider] Taking Active Self  tacrolimus (PROTOPIC) 0.1 % ointment [id number] Apply topically 2 (two) times daily. Moye, [place] Taking Active   triamcinolone cream (KENALOG) 0.1 % [id number] APPLY  CREAM EXTERNALLY TWICE  DAILY TO AFFECTED AREA ON  FINGERS ScotEinar Pheasant Taking Active   Ubrogepant (UBRELVY) 100 MG TABS [id number] Take 100 mg by mouth 2 (two) times daily as needed. [provider] Taking Active             Pertinent Labs:  Lab Results  Component Value Date   HGBA1C 5.5 07/17/2021   Lab Results  Component Value Date   CHOL 264 (H) 07/17/2021   HDL 45.50 07/17/2021   LDLCALC 181 (H) 07/17/2021   LDLDIRECT 181.0 06/26/2020   TRIG 185.0 (H) 07/17/2021   CHOLHDL 6 07/17/2021   Lab Results  Component Value Date   CREATININE 0.81 07/17/2021   BUN 12 07/17/2021   NA 138 07/17/2021   K 4.5 07/17/2021   CL 100 07/17/2021   CO2 30  07/17/2021    SDOH:  (Social Determinants of Health) assessments and interventions performed:  SDOH Interventions    Flowsheet Row Most Recent Value  SDOH Interventions   Financial Strain Interventions Intervention Not Indicated       CCM Care Plan  Review of patient past medical history, allergies, medications, health status, including review of consultants reports, laboratory and other test data, was performed as part of comprehensive evaluation and provision of chronic care management services.   Care Plan : Medication Management  Updates made by [name], RPH-CPP since 11/13/2021 12:00 AM  Completed 11/13/2021   Problem: Obesity, Chronic Pain, Migraines, Depression Resolved 11/13/2021     Long-Range Goal: Disease Progression Prevention Completed 11/13/2021  Start Date: 03/28/2021  Recent Progress: On track  Priority: High  Note:   Current Barriers:  Unable to achieve control of weight  Complex patient with multiple comorbidities at a high risk for exacerbation and hospitalization  Pharmacist Clinical Goal(s):  Over the next 90 days, patient will achieve improvement of weight through collaboration with PharmD and provider.   Interventions: 1:1 collaboration with [name], MD regarding development and  update of comprehensive plan of care as evidenced by provider attestation and co-signature Inter-disciplinary care team collaboration (see longitudinal plan of care) Comprehensive medication review performed; medication list updated in electronic medical record  Hyperglycemia, Obesity: Unable to achieve weight loss through diet and exercise alone; current regimen: Ozempic 2 mg weekly - taking at night due to improved tolerability Baseline weight: 292 lbs; most recent home weight: 238 lbs lbs; total weight loss thus far 54 lbs (18% loss from baseline) Hx bariatric clinic, but decided not to proceed with surgery - benefited from the nutrition class, but husband did not want her to pursue surgery Hx bupropion, ineffective for mental health/weight management Hx topiramate, intolerable side effects of GI upset, CNS depression Hx phentermine years ago  Praised for continued success with weight loss. Recommended to continue current regimen at this time. Follow for access issues.   Hyperlipidemia: Controlled per last lipid panel; current treatment: atorvastatin 10 mg daily  Previously recommended to continue current regimen at this time  Depression/Anxiety Improving; current treatment: sertraline 200 mg daily, methylphenidate 20 mg BID, quetiapine 50 mg; follows w/ Dr. Doyne Keel Per documentation, hx: citalopram, escitalopram, fluoxetine, venlafaxine, duloxetine, hydroxyzine, quetiapine, hydroxyzine, Trintellix, lurasidone not covered d/t not having bipolar dx Previously recommended to continue current regimen along with psychiatry collaboration.   Chronic Pain: Moderately well controlled; current regimen: oxycodone 10 mg 5 times daily, gabapentin 400 mg TID, ibuprofen 800 mg TID PRN, acetaminophen [phone] mg PRN, methocarbamol 500 mg 2-3 times daily PRN; follows with [facility]  Miralax PRN Has script for naloxone at home. In date per Pain [facility] documentation Previously recommended  to continue current regimen at this time along with neurology collaboration   Migraines, Cognitive Dysfunction: Moderately well controlled; current regimen: Ajovy 140 mg Q28 days, rizatriptan 10 mg PRN migraines, Ubrelvy 100 mg PRN migraines;  Previously recommended to continue current regimen at this time along with neurology collaboration  Dermatologic Conditions - Warts and Dermatitis: Uncontrolled; current regimen: tacrolimus 0.1% ointment BID to hands, continue clobetasol 0.05% ointment BID on weekends and PRN; follows w/ Dr. Laurence Ferrari. Upcoming procedure in December.  Previously recommended to continue current regimen at this time  GERD, Indigestion: Moderately well controlled; current regimen: esomeprazole 40 mg PRN Previously recommended to continue current regimen at this time  Patient Goals/Self-Care Activities Over the next 90 days,  patient will:  - take medications as prescribed collaborate with provider on medication access solutions engage in dietary modifications by moderating portion sizes      Plan: Goals of care met. Closing CCM cas  Catie Darnelle Maffucci, PharmD, [place], [place] Clinical Pharmacist [employer] [facility] [employer] [phone]

## 2021-11-14 ENCOUNTER — Encounter: Payer: Medicare HMO | Admitting: Dermatology

## 2021-11-17 DIAGNOSIS — F325 Major depressive disorder, single episode, in full remission: Secondary | ICD-10-CM | POA: Diagnosis not present

## 2021-11-17 DIAGNOSIS — E7800 Pure hypercholesterolemia, unspecified: Secondary | ICD-10-CM

## 2021-11-20 ENCOUNTER — Other Ambulatory Visit: Payer: Self-pay

## 2021-11-20 ENCOUNTER — Encounter: Payer: Self-pay | Admitting: Internal Medicine

## 2021-11-20 ENCOUNTER — Encounter: Payer: Self-pay | Admitting: Obstetrics and Gynecology

## 2021-11-20 ENCOUNTER — Ambulatory Visit (INDEPENDENT_AMBULATORY_CARE_PROVIDER_SITE_OTHER): Payer: Medicare HMO | Admitting: Obstetrics and Gynecology

## 2021-11-20 VITALS — BP 124/80 | Ht 71.0 in | Wt 244.0 lb

## 2021-11-20 DIAGNOSIS — N898 Other specified noninflammatory disorders of vagina: Secondary | ICD-10-CM | POA: Diagnosis not present

## 2021-11-20 NOTE — Progress Notes (Signed)
Kathleen Pheasant, MD   Chief Complaint  Patient presents with   Vaginal Irritation    No discharge, odor or itchiness x 6 months    HPI:      [name] is a 51 y.o. No obstetric history on file. whose LMP was Patient's last menstrual period was 12/08/2006., presents today for NP eval of vaginal irritation/burning, referred by PCP. Pt has noticed a cut on LT labia minora with burning sensation/redness, off and on for the past year, always in same area, never fully resolves. Treated with nystatin crm 09/21/21 with PCP without sx relief. Neg BV/yeast culture 11/22. No increased vag d/c or odor. Sex is extra painful in that one spot as well. Occas with bleeding from that area. Blots with wiping, uses dove sens skin soap, no dryer sheets, wears regular, cotton underwear. Uses estrace crm topically twice wkly without sx change. No hx of HSV. Has lost wt and not in tight clothes/pants. Uses wash cloth externally vaginally but not at vag opening.  She is sex active, does have some vaginal dryness. S/p hyst with Dr. Rayford Halsted. No PMB.    Patient Active Problem List   Diagnosis Date Noted   Runny nose 09/30/2021   Dizziness 07/22/2021   Vitamin B12 deficiency    Vaginal irritation 03/03/2021   Soft tissue mass 03/03/2021   Hemorrhoids 12/23/2020   DDD (degenerative disc disease), lumbar 10/03/2020   Abscess of right hip 05/11/2020   Healthcare maintenance 01/02/2020   Anemia 01/02/2020   Status post total hip replacement, 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   Hyperglycemia 06/24/2019   Vaginal bleeding 09/30/2016   Primary osteoarthritis of right hip 01/17/2016   Chronic gastritis 07/18/2015   Depression, major, in remission ([place]) 05/01/2015   Generalized anxiety disorder 05/01/2015   Chronic tension-type headache, intractable 04/24/2015   Dysmenorrhea 04/24/2015   Endometriosis 04/24/2015   Hidradenitis 04/24/2015   Hot flashes 03/12/2015   Chest pain 02/15/2015   BMI 35.0-35.9,adult  10/16/2014   Breast pain, right 09/08/2014   Abdominal pain 07/24/2014   GERD (gastroesophageal reflux disease) 04/24/2014   Ankle pain, right 04/24/2014   Chiari malformation type I 01/26/2014   Vitamin D deficiency 01/18/2014   Diverticulitis 01/10/2014   Hypercholesteremia 01/10/2014   Right hip pain 01/10/2014   Migraine headaches 01/10/2014   Obstructive sleep apnea 01/10/2014   Chronic back pain 12/13/2012   Mitral valve prolapse 12/13/2012    Past Surgical History:  Procedure Laterality Date   ABDOMINAL HYSTERECTOMY  03/27/07   laparoscopic   BREAST EXCISIONAL BIOPSY Left 2004   fibroadenoma   CESAREAN SECTION     1999/2004 twins   chiari decompression     COLONOSCOPY WITH PROPOFOL N/A 04/28/2015   Procedure: COLONOSCOPY WITH PROPOFOL;  Surgeon: Manya Silvas, MD;  Location: [place];  Service: Endoscopy;  Laterality: N/A;   COLONOSCOPY WITH PROPOFOL N/A 03/19/2021   Procedure: COLONOSCOPY WITH PROPOFOL;  Surgeon: Lesly Rubenstein, MD;  Location: [facility] ENDOSCOPY;  Service: Endoscopy;  Laterality: N/A;   CYSTOSCOPY     bladder ulcers   ESOPHAGOGASTRODUODENOSCOPY N/A 04/28/2015   Procedure: ESOPHAGOGASTRODUODENOSCOPY (EGD);  Surgeon: Manya Silvas, MD;  Location: [facility] ENDOSCOPY;  Service: Endoscopy;  Laterality: N/A;   ESOPHAGOGASTRODUODENOSCOPY (EGD) WITH PROPOFOL N/A 03/19/2021   Procedure: ESOPHAGOGASTRODUODENOSCOPY (EGD) WITH PROPOFOL;  Surgeon: Lesly Rubenstein, MD;  Location: [facility] ENDOSCOPY;  Service: Endoscopy;  Laterality: N/A;   HERNIA REPAIR Right 2003   inguinal   INCISION AND DRAINAGE HIP Right  05/11/2020   Procedure: IRRIGATION AND DEBRIDEMENT HIP;  Surgeon: Hessie Knows, MD;  Location: [facility];  Service: Orthopedics;  Laterality: Right;   OVARIAN CYST REMOVAL  2001   right   TEMPOROMANDIBULAR JOINT SURGERY  2000   TOTAL HIP ARTHROPLASTY Right 12/28/2019   Procedure: TOTAL HIP ARTHROPLASTY ANTERIOR APPROACH;  Surgeon: Hessie Knows, MD;   Location: [facility];  Service: Orthopedics;  Laterality: Right;   TUBAL LIGATION     Bilateral    Family History  Problem Relation Age of Onset   Hyperlipidemia Mother    Anxiety disorder Mother    Atrial fibrillation Mother    Hypertension Mother    Memory loss Mother    Anxiety disorder Father    Stroke Father    Hyperlipidemia Father    Hypertension Father    Congestive Heart Failure Father    Depression Father    Hyperlipidemia Brother    Colon cancer Paternal Aunt    Dementia Paternal Aunt    Dementia Maternal Grandmother    Dementia Paternal Grandmother    ADD / ADHD Daughter    Breast cancer Neg Hx     Social History   Socioeconomic History   Marital status: Married    Spouse name: Not on file   Number of children: 3   Years of education: 13   Highest education level: Some college, no degree  Occupational History   Occupation: Disability  Tobacco Use   Smoking status: Never   Smokeless tobacco: Never  Vaping Use   Vaping Use: Never used  Substance and Sexual Activity   Alcohol use: Not Currently    Alcohol/week: 0.0 standard drinks   Drug use: No   Sexual activity: Yes    Birth control/protection: None  Other Topics Concern   Not on file  Social History Narrative   Patient drinks 2-3 cups of caffeine daily.   Patient is right handed.       Social Hx:   Current living situation- [place]. Living with husband and 3 kids   Born and Raised- [place].raised by both parents.    Siblings- 1 brother who is 7 yrs older than her   Legal issues- none   Married for 24 yrs. Currently unemployed since 2011 when she hurt her back. HS grad and CNA and phlebotomy.    Social Determinants of Health   Financial Resource Strain: Low Risk    Difficulty of Paying Living Expenses: Not hard at all  Food Insecurity: Not on file  Transportation Needs: Not on file  Physical Activity: Not on file  Stress: Not on file  Social Connections: Not on file  Intimate Partner  Violence: Not on file    Outpatient Medications Prior to Visit  Medication Sig Dispense Refill   acetaminophen (TYLENOL) 500 MG tablet Take 500-1,000 mg by mouth 2 (two) times daily as needed for mild pain.      AIMOVIG 140 MG/ML SOAJ Inject 140 mg into the skin every 28 (twenty-eight) days.     atorvastatin (LIPITOR) 10 MG tablet Take 1 tablet (10 mg total) by mouth daily. 90 tablet 3   azelastine (ASTELIN) 0.1 % nasal spray Place 1 spray into both nostrils 2 (two) times daily. Use in each nostril as directed 30 mL 1   clobetasol (TEMOVATE) 0.05 % external solution Apply to aa's scalp QD PRN. Avoid the face, groin, and axilla. Avoid applying to face, groin, and axilla. Use as directed. Risk of skin atrophy with long-term use reviewed.  50 mL 3   clotrimazole-betamethasone (LOTRISONE) cream Apply 1 application topically 2 (two) times daily. 30 g 0   cyanocobalamin (,VITAMIN B-12,) 1000 MCG/ML injection INJECT 1 ML INTRAMUSCULARLY ONCE EVERY MONTH 10 mL 0   diclofenac sodium (VOLTAREN) 1 % GEL Apply 1 application topically 4 (four) times daily as needed (pain.).      erythromycin with ethanol (THERAMYCIN) 2 % external solution Apply topically 2 (two) times daily. As needed to affected areas of skin (pustules and bumps) 60 mL 3   esomeprazole (NEXIUM) 40 MG capsule Take 1 capsule by mouth once daily 90 capsule 3   estradiol (ESTRACE VAGINAL) 0.1 MG/GM vaginal cream One applicator q hs x 5 nights and then 2x/week. (Patient taking differently: Place 1 Applicatorful vaginally once a week.) 42.5 g 1   ketoconazole (NIZORAL) 2 % shampoo Shampoo into scalp let sit 5-10 mins then wash out. Use 3d/wk. 120 mL 3   lactase (RA DAIRY AID) 3000 units tablet Take 9,000 Units by mouth 3 (three) times daily as needed (with dairy consumption).      methocarbamol (ROBAXIN) 500 MG tablet Take 500 mg by mouth 4 (four) times daily.     methylphenidate (RITALIN) 20 MG tablet Take 1 tablet (20 mg total) by mouth 2 (two)  times daily with breakfast and lunch. 60 tablet 0   methylphenidate (RITALIN) 20 MG tablet Take 1 tablet (20 mg total) by mouth 2 (two) times daily with breakfast and lunch. 60 tablet 0   mupirocin ointment (BACTROBAN) 2 % Apply to healing wound TID. 22 g 1   naloxone (NARCAN) 2 MG/2ML injection Inject into the vein as needed.     nystatin cream (MYCOSTATIN) Apply topically 2 (two) times daily. 30 g 0   Oxycodone HCl 10 MG TABS Take 10 mg by mouth 5 (five) times daily.   0   QUEtiapine (SEROQUEL XR) 50 MG TB24 24 hr tablet Take 1 tablet (50 mg total) by mouth at bedtime. 30 tablet 1   rizatriptan (MAXALT) 10 MG tablet Take 10 mg by mouth as needed for migraine. May repeat in 2 hours if needed     Semaglutide, 2 MG/DOSE, 8 MG/3ML SOPN Inject 2 mg as directed once a week. 9 mL 2   Simethicone (GAS RELIEF PO) Take 125 mg by mouth 2 (two) times daily as needed for flatulence. Anti gas     tacrolimus (PROTOPIC) 0.1 % ointment Apply topically 2 (two) times daily. 100 g 2   triamcinolone cream (KENALOG) 0.1 % APPLY  CREAM EXTERNALLY TWICE DAILY TO AFFECTED AREA ON  FINGERS 30 g 0   Ubrogepant (UBRELVY) 100 MG TABS Take 100 mg by mouth 2 (two) times daily as needed.     gabapentin (NEURONTIN) 400 MG capsule Take by mouth.     sertraline (ZOLOFT) 100 MG tablet Take 2 tablets (200 mg total) by mouth daily. 180 tablet 0   fluconazole (DIFLUCAN) 150 MG tablet Take one tablet x 1 day and then may repeat x 1 in 3 days if persistent symptoms. 2 tablet 0   No facility-administered medications prior to visit.      ROS:  Review of Systems  Constitutional:  Negative for fever.  Gastrointestinal:  Negative for blood in stool, constipation, diarrhea, nausea and vomiting.  Genitourinary:  Positive for vaginal pain. Negative for dyspareunia, dysuria, flank pain, frequency, hematuria, urgency, vaginal bleeding and vaginal discharge.  Musculoskeletal:  Negative for back pain.  Skin:  Negative for rash.  BREAST:  No symptoms   OBJECTIVE:   Vitals:  BP 124/80    Ht 5\' 11"  (1.803 m)    Wt 244 lb (110.7 kg)    LMP 12/08/2006    BMI 34.03 kg/m   Physical Exam Vitals reviewed.  Constitutional:      Appearance: She is well-developed.  Pulmonary:     Effort: Pulmonary effort is normal.  Genitourinary:    Pubic Area: No rash.      Labia:        Right: No rash, tenderness or lesion.        Left: Tenderness and lesion present. No rash.     Musculoskeletal:        General: Normal range of motion.     Cervical back: Normal range of motion.  Skin:    General: Skin is warm and dry.  Neurological:     General: No focal deficit present.     Mental Status: She is alert and oriented to person, place, and time.  Psychiatric:        Mood and Affect: Mood normal.        Behavior: Behavior normal.        Thought Content: Thought content normal.        Judgment: Judgment normal.    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 If neg, treats as tear. Use estrace crm QHS for 2 wks, as well as vaseline/coconut oil QAM as moisturizer/to reduce friction. Will f/u after tx for 2 wks.     Return if symptoms worsen or fail to improve.  Messiah Rovira B. Rj Pedrosa, PA-C 11/20/2021 3:33 PM

## 2021-11-21 ENCOUNTER — Encounter: Payer: Self-pay | Admitting: Internal Medicine

## 2021-11-21 ENCOUNTER — Telehealth (INDEPENDENT_AMBULATORY_CARE_PROVIDER_SITE_OTHER): Payer: Medicare HMO | Admitting: Internal Medicine

## 2021-11-21 DIAGNOSIS — K219 Gastro-esophageal reflux disease without esophagitis: Secondary | ICD-10-CM | POA: Diagnosis not present

## 2021-11-21 DIAGNOSIS — G4733 Obstructive sleep apnea (adult) (pediatric): Secondary | ICD-10-CM | POA: Diagnosis not present

## 2021-11-21 DIAGNOSIS — G935 Compression of brain: Secondary | ICD-10-CM | POA: Diagnosis not present

## 2021-11-21 DIAGNOSIS — N898 Other specified noninflammatory disorders of vagina: Secondary | ICD-10-CM

## 2021-11-21 DIAGNOSIS — G8929 Other chronic pain: Secondary | ICD-10-CM

## 2021-11-21 DIAGNOSIS — F411 Generalized anxiety disorder: Secondary | ICD-10-CM

## 2021-11-21 DIAGNOSIS — F325 Major depressive disorder, single episode, in full remission: Secondary | ICD-10-CM | POA: Diagnosis not present

## 2021-11-21 DIAGNOSIS — M545 Low back pain, unspecified: Secondary | ICD-10-CM

## 2021-11-21 DIAGNOSIS — R739 Hyperglycemia, unspecified: Secondary | ICD-10-CM | POA: Diagnosis not present

## 2021-11-21 DIAGNOSIS — E7800 Pure hypercholesterolemia, unspecified: Secondary | ICD-10-CM | POA: Diagnosis not present

## 2021-11-21 LAB — HSV 2 ANTIBODY, IGG: HSV 2 IgG, Type Spec: <0.91 index (ref 0.00–0.90)

## 2021-11-21 NOTE — Progress Notes (Signed)
Patient ID: Kathleen Everett, female   DOB: Mar 03, 1971, 51 y.o.   MRN: [id number]   Virtual Visit via video Note  This visit type was conducted due to national recommendations for restrictions regarding the COVID-19 pandemic (e.g. social distancing).  This format is felt to be most appropriate for this patient at this time.  All issues noted in this document were discussed and addressed.  No physical exam was performed (except for noted visual exam findings with Video Visits).   I connected with Darius Bump by a video enabled telemedicine application and verified that I am speaking with the correct person using two identifiers. Location patient: home Location provider: work  Persons participating in the virtual visit: patient, provider  The limitations, risks, security and privacy concerns of performing an evaluation and management service by video and the availability of in person appointments have been discussed.  It has also been discussed with the patient that there may be a patient responsible charge related to this service. The patient expressed understanding and agreed to proceed.   Reason for visit: follow up appt  HPI: Follow up regarding her cholesterol, increased stress, weight loss and chronic back pain.  Has done well with weight loss.  On ozempic.  Taking in the evening.  No significant nausea now.  Seeing pain clinic. Back flared recently - increased pain - yesterday.  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  Increased spasms last night.  Taking gabapentin, muscle relaxer and pain medication.  Using heating pad.  Feels like typical flare.  Has f/u next week and pain MD.  No chest pain or sob reported.  No increased cough or congestion reported.  Previous constipation.  Better now.  Saw gyn yesterday for persistent pain - irritation.  Recommended vaseline in am and estrogen cream in pm.  Has lost significant amount of weight.  Not wearing cpap.  States husband  reports decreased snoring.  Request reevaluation - for sleep apnea.     ROS: See pertinent positives and negatives per HPI.  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   Endometriosis    Generalized anxiety disorder 05/01/2015   Has previously seen psychiatry. Having some issues with what she feels is related to stress and anxiety   GERD (gastroesophageal reflux disease)    Hemorrhoids 12/23/2020   Hidradenitis    History of ovarian cyst    Hot flashes 03/12/2015   Hypercholesteremia 01/10/2014   Off lipitor. Elevated cholesterol. Labs discussed. Restart lipitor. Follow lipid panel and liver function tests.   Hyperglycemia 06/24/2019   Major depressive disorder 05/01/2015   Migraine headaches 01/10/2014   Has seen neurology. Topamax.   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 Level is 21.6    Past Surgical History:  Procedure Laterality Date   ABDOMINAL HYSTERECTOMY  03/27/07   laparoscopic   BREAST EXCISIONAL BIOPSY Left 2004    fibroadenoma   CESAREAN SECTION     1999/2004 twins   chiari decompression     COLONOSCOPY WITH PROPOFOL N/A 04/28/2015   Procedure: COLONOSCOPY WITH PROPOFOL;  Surgeon: Manya Silvas, MD;  Location: [facility] ENDOSCOPY;  Service: Endoscopy;  Laterality: N/A;   COLONOSCOPY WITH PROPOFOL N/A 03/19/2021   Procedure: COLONOSCOPY WITH PROPOFOL;  Surgeon: Lesly Rubenstein, MD;  Location: [facility] ENDOSCOPY;  Service: Endoscopy;  Laterality: N/A;   CYSTOSCOPY     bladder ulcers   ESOPHAGOGASTRODUODENOSCOPY N/A 04/28/2015   Procedure: ESOPHAGOGASTRODUODENOSCOPY (EGD);  Surgeon: Manya Silvas, MD;  Location: [facility] ENDOSCOPY;  Service: Endoscopy;  Laterality: N/A;   ESOPHAGOGASTRODUODENOSCOPY (EGD) WITH PROPOFOL N/A 03/19/2021   Procedure: ESOPHAGOGASTRODUODENOSCOPY (EGD) WITH PROPOFOL;  Surgeon: Lesly Rubenstein, MD;  Location: [facility] ENDOSCOPY;  Service: Endoscopy;  Laterality: N/A;   HERNIA REPAIR Right 2003   inguinal   INCISION AND DRAINAGE HIP Right 05/11/2020   Procedure: IRRIGATION AND DEBRIDEMENT HIP;  Surgeon: Hessie Knows, MD;  Location: [facility] ORS;  Service: Orthopedics;  Laterality: Right;   OVARIAN CYST REMOVAL  2001   right   TEMPOROMANDIBULAR JOINT SURGERY  2000   TOTAL HIP ARTHROPLASTY Right 12/28/2019   Procedure: TOTAL HIP ARTHROPLASTY ANTERIOR APPROACH;  Surgeon: Hessie Knows, MD;  Location: [facility] ORS;  Service: Orthopedics;  Laterality: Right;   TUBAL LIGATION     Bilateral    Family History  Problem Relation Age of Onset   Hyperlipidemia Mother    Anxiety disorder Mother    Atrial fibrillation Mother    Hypertension Mother    Memory loss Mother    Anxiety disorder Father    Stroke Father    Hyperlipidemia Father    Hypertension Father    Congestive Heart Failure Father    Depression Father    Hyperlipidemia Brother    Colon cancer Paternal Aunt    Dementia Paternal Aunt    Dementia Maternal Grandmother    Dementia Paternal Grandmother    ADD / ADHD Daughter    Breast  cancer Neg Hx     SOCIAL HX: reviewed.    Current Outpatient Medications:    acetaminophen (TYLENOL) 500 MG tablet, Take 500-1,000 mg by mouth 2 (two) times daily as needed for mild pain. , Disp: , Rfl:    AIMOVIG 140 MG/ML SOAJ, Inject 140 mg into the skin every 28 (twenty-eight) days., Disp: , Rfl:    atorvastatin (LIPITOR) 10 MG tablet, Take 1 tablet (10 mg total) by mouth daily., Disp: 90 tablet, Rfl: 3   azelastine (ASTELIN) 0.1 % nasal spray, Place 1 spray into both nostrils 2 (two) times daily. Use in each nostril as directed, Disp: 30 mL, Rfl: 1   clobetasol (TEMOVATE) 0.05 % external solution, Apply to aa's scalp QD PRN. Avoid the face, groin, and axilla. Avoid applying to face, groin, and axilla. Use as directed. Risk of skin atrophy with long-term use reviewed., Disp: 50 mL, Rfl: 3   clotrimazole-betamethasone (LOTRISONE) cream, Apply 1 application topically 2 (two)  times daily., Disp: 30 g, Rfl: 0   cyanocobalamin (,VITAMIN B-12,) 1000 MCG/ML injection, INJECT 1 ML INTRAMUSCULARLY ONCE EVERY MONTH, Disp: 10 mL, Rfl: 0   diclofenac sodium (VOLTAREN) 1 % GEL, Apply 1 application topically 4 (four) times daily as needed (pain.). , Disp: , Rfl:    erythromycin with ethanol (THERAMYCIN) 2 % external solution, Apply topically 2 (two) times daily. As needed to affected areas of skin (pustules and bumps), Disp: 60 mL, Rfl: 3   esomeprazole (NEXIUM) 40 MG capsule, Take 1 capsule by mouth once daily, Disp: 90 capsule, Rfl: 3   estradiol (ESTRACE VAGINAL) 0.1 MG/GM vaginal cream, One applicator q hs x 5 nights and then 2x/week. (Patient taking differently: Place 1 Applicatorful vaginally once a week.), Disp: 42.5 g, Rfl: 1   gabapentin (NEURONTIN) 400 MG capsule, Take by mouth., Disp: , Rfl:    ketoconazole (NIZORAL) 2 % shampoo, Shampoo into scalp let sit 5-10 mins then wash out. Use 3d/wk., Disp: 120 mL, Rfl: 3   lactase (RA DAIRY AID) 3000 units tablet, Take 9,000 Units by mouth 3 (three)  times daily as needed (with dairy consumption). , Disp: , Rfl:    methocarbamol (ROBAXIN) 500 MG tablet, Take 500 mg by mouth 4 (four) times daily., Disp: , Rfl:    methylphenidate (RITALIN) 20 MG tablet, Take 1 tablet (20 mg total) by mouth 2 (two) times daily with breakfast and lunch., Disp: 60 tablet, Rfl: 0   mupirocin ointment (BACTROBAN) 2 %, Apply to healing wound TID., Disp: 22 g, Rfl: 1   naloxone (NARCAN) 2 MG/2ML injection, Inject into the vein as needed., Disp: , Rfl:    nystatin cream (MYCOSTATIN), Apply topically 2 (two) times daily., Disp: 30 g, Rfl: 0   Oxycodone HCl 10 MG TABS, Take 10 mg by mouth 5 (five) times daily. , Disp: , Rfl: 0   QUEtiapine (SEROQUEL XR) 50 MG TB24 24 hr tablet, Take 1 tablet (50 mg total) by mouth at bedtime., Disp: 30 tablet, Rfl: 1   rizatriptan (MAXALT) 10 MG tablet, Take 10 mg by mouth as needed for migraine. May repeat in 2 hours if needed, Disp: , Rfl:    Semaglutide, 2 MG/DOSE, 8 MG/3ML SOPN, Inject 2 mg as directed once a week., Disp: 9 mL, Rfl: 2   sertraline (ZOLOFT) 100 MG tablet, Take 2 tablets (200 mg total) by mouth daily., Disp: 180 tablet, Rfl: 0   Simethicone (GAS RELIEF PO), Take 125 mg by mouth 2 (two) times daily as needed for flatulence. Anti gas, Disp: , Rfl:    tacrolimus (PROTOPIC) 0.1 % ointment, Apply topically 2 (two) times daily., Disp: 100 g, Rfl: 2   triamcinolone cream (KENALOG) 0.1 %, APPLY  CREAM EXTERNALLY TWICE DAILY TO AFFECTED AREA ON  FINGERS, Disp: 30 g, Rfl: 0   Ubrogepant (UBRELVY) 100 MG TABS, Take 100 mg by mouth 2 (two) times daily as needed., Disp: , Rfl:   EXAM:  GENERAL: alert, oriented, appears well and in no acute distress  HEENT: atraumatic, conjunttiva clear, no obvious abnormalities on inspection of external nose and ears  NECK: normal movements of the head and neck  LUNGS: on inspection no signs of respiratory distress, breathing rate appears normal, no obvious gross SOB, gasping or wheezing  CV:  no obvious cyanosis  PSYCH/NEURO: pleasant and cooperative, no obvious depression or anxiety, speech and thought processing grossly intact  ASSESSMENT AND PLAN:  Discussed the following assessment and plan:  Problem List Items Addressed  This Visit     Chiari malformation type I    S/p surgery.  Has been followed by neurology.       Chronic back pain    Followed by pain clinic.  Has f/u next week.       Depression, major, in remission [facility])    Seeing psychiatry.  Overall appears to be doing well on current medication regimen.        Generalized anxiety disorder    Seeing psychiatry.  Appears to be stable.       GERD (gastroesophageal reflux disease)    Per report - on nexium.  No upper symptoms reported.        Hypercholesteremia    On lipitor.  Low cholesterol diet and exercise.  Follow lipid panel and liver function tests.        Hyperglycemia    Low carb diet and exercise.  Follow met b and a1c.       Obstructive sleep apnea    Not using cpap.  Has lost a lot of weight.  Request reevaluation and testing - regarding sleep apnea.  Refer to pulmonary for further evaluation and f/u testing.        Relevant Orders   Ambulatory referral to Pulmonology   Vaginal irritation    Saw gyn.  Recommended vaseline in am and estrogen cream in pm.  Follow.        Return in about 3 months (around 02/19/2022) for follow up appt.   I discussed the assessment and treatment plan with the patient. The patient was provided an opportunity to ask questions and all were answered. The patient agreed with the plan and demonstrated an understanding of the instructions.   The patient was advised to call back or seek an in-person evaluation if the symptoms worsen or if the condition fails to improve as anticipated.   [name], MD

## 2021-11-21 NOTE — Telephone Encounter (Signed)
Changed to virtual

## 2021-11-22 ENCOUNTER — Telehealth: Payer: Self-pay | Admitting: Internal Medicine

## 2021-11-22 ENCOUNTER — Encounter: Payer: Self-pay | Admitting: Internal Medicine

## 2021-11-22 ENCOUNTER — Other Ambulatory Visit: Payer: Self-pay | Admitting: Student

## 2021-11-22 DIAGNOSIS — G43019 Migraine without aura, intractable, without status migrainosus: Secondary | ICD-10-CM

## 2021-11-22 DIAGNOSIS — R2689 Other abnormalities of gait and mobility: Secondary | ICD-10-CM

## 2021-11-22 DIAGNOSIS — G3184 Mild cognitive impairment, so stated: Secondary | ICD-10-CM

## 2021-11-22 DIAGNOSIS — R739 Hyperglycemia, unspecified: Secondary | ICD-10-CM

## 2021-11-22 DIAGNOSIS — E7800 Pure hypercholesterolemia, unspecified: Secondary | ICD-10-CM

## 2021-11-22 DIAGNOSIS — G935 Compression of brain: Secondary | ICD-10-CM

## 2021-11-22 NOTE — Assessment & Plan Note (Signed)
Not using cpap.  Has lost a lot of weight.  Request reevaluation and testing - regarding sleep apnea.  Refer to pulmonary for further evaluation and f/u testing.

## 2021-11-22 NOTE — Assessment & Plan Note (Signed)
Per report - on nexium.  No upper symptoms reported.

## 2021-11-22 NOTE — Assessment & Plan Note (Signed)
Saw gyn.  Recommended vaseline in am and estrogen cream in pm.  Follow.

## 2021-11-22 NOTE — Assessment & Plan Note (Signed)
Followed by pain clinic.  Has f/u next week.

## 2021-11-22 NOTE — Assessment & Plan Note (Signed)
On lipitor.  Low cholesterol diet and exercise.  Follow lipid panel and liver function tests.   

## 2021-11-22 NOTE — Assessment & Plan Note (Signed)
Low carb diet and exercise.  Follow met b and a1c.

## 2021-11-22 NOTE — Assessment & Plan Note (Signed)
Seeing psychiatry.  Overall appears to be doing well on current medication regimen.

## 2021-11-22 NOTE — Assessment & Plan Note (Signed)
S/p surgery.  Has been followed by neurology.

## 2021-11-22 NOTE — Telephone Encounter (Signed)
Schedule f/u appt in 3 months.  Schedule fasting lab appt in 6-8 weeks.

## 2021-11-22 NOTE — Assessment & Plan Note (Signed)
Seeing psychiatry.  Appears to be stable.

## 2021-11-23 NOTE — Telephone Encounter (Signed)
Scheduled

## 2021-11-27 DIAGNOSIS — M25551 Pain in right hip: Secondary | ICD-10-CM | POA: Diagnosis not present

## 2021-11-27 DIAGNOSIS — M160 Bilateral primary osteoarthritis of hip: Secondary | ICD-10-CM | POA: Diagnosis not present

## 2021-11-27 DIAGNOSIS — M5441 Lumbago with sciatica, right side: Secondary | ICD-10-CM | POA: Diagnosis not present

## 2021-11-27 DIAGNOSIS — Z79899 Other long term (current) drug therapy: Secondary | ICD-10-CM | POA: Diagnosis not present

## 2021-11-27 DIAGNOSIS — Z5181 Encounter for therapeutic drug level monitoring: Secondary | ICD-10-CM | POA: Diagnosis not present

## 2021-11-27 DIAGNOSIS — M5137 Other intervertebral disc degeneration, lumbosacral region: Secondary | ICD-10-CM | POA: Diagnosis not present

## 2021-11-27 DIAGNOSIS — M5412 Radiculopathy, cervical region: Secondary | ICD-10-CM | POA: Diagnosis not present

## 2021-11-27 DIAGNOSIS — G2581 Restless legs syndrome: Secondary | ICD-10-CM | POA: Diagnosis not present

## 2021-11-27 DIAGNOSIS — M47816 Spondylosis without myelopathy or radiculopathy, lumbar region: Secondary | ICD-10-CM | POA: Diagnosis not present

## 2021-11-27 DIAGNOSIS — M5442 Lumbago with sciatica, left side: Secondary | ICD-10-CM | POA: Diagnosis not present

## 2021-11-27 DIAGNOSIS — G44209 Tension-type headache, unspecified, not intractable: Secondary | ICD-10-CM | POA: Diagnosis not present

## 2021-12-04 DIAGNOSIS — K000 Anodontia: Secondary | ICD-10-CM | POA: Diagnosis not present

## 2021-12-05 ENCOUNTER — Other Ambulatory Visit: Payer: Self-pay

## 2021-12-05 ENCOUNTER — Ambulatory Visit (INDEPENDENT_AMBULATORY_CARE_PROVIDER_SITE_OTHER): Payer: Medicare HMO | Admitting: Podiatry

## 2021-12-05 DIAGNOSIS — L600 Ingrowing nail: Secondary | ICD-10-CM

## 2021-12-05 MED ORDER — NEOMYCIN-POLYMYXIN-HC 1 % OT SOLN: OTIC | 0 refills | Status: DC

## 2021-12-05 NOTE — Patient Instructions (Signed)

## 2021-12-05 NOTE — Progress Notes (Signed)
°  Subjective:  Patient ID: Kathleen Everett, female    DOB: December 26, 1970,  MRN: [id number]  Chief Complaint  Patient presents with   Ingrown Toenail      Ingrown toe nails on both big toes    51 y.o. female presents with the above complaint. History confirmed with patient.  Returns today for ingrown toenail procedure  Objective:  Physical Exam: warm, good capillary refill, no trophic changes or ulcerative lesions, normal DP and PT pulses, and normal sensory exam. she has ingrown's without paronychia of both great toes Assessment:   No diagnosis found.    Plan:  Patient was evaluated and treated and all questions answered.    Ingrown Nail, bilaterally -Patient elects to proceed with minor surgery to remove ingrown toenail today. Consent reviewed and signed by patient. -Ingrown nail excised. See procedure note. -Educated on post-procedure care including soaking. Written instructions provided and reviewed. -Patient to follow up in 3 weeks for nail check.  Procedure: Excision of Ingrown Toenail Location: Bilateral 1st toe medial and lateral nail borders. Anesthesia: Lidocaine 1% plain; 1.5 mL and Marcaine 0.5% plain; 1.5 mL, digital block. Skin Prep: Betadine. Dressing: Silvadene; telfa; dry, sterile, compression dressing. Technique: Following skin prep, the toe was exsanguinated and a tourniquet was secured at the base of the toe. The affected nail border was freed, split with a nail splitter, and excised. Chemical matrixectomy was then performed with phenol and irrigated out with alcohol. The tourniquet was then removed and sterile dressing applied. Disposition: Patient tolerated procedure well. Patient to return in 3 weeks for follow-up.   Return in about 3 weeks (around 12/26/2021) for nail re-check.

## 2021-12-26 ENCOUNTER — Ambulatory Visit (INDEPENDENT_AMBULATORY_CARE_PROVIDER_SITE_OTHER): Payer: Medicare HMO | Admitting: Podiatry

## 2021-12-26 ENCOUNTER — Other Ambulatory Visit: Payer: Self-pay

## 2021-12-26 DIAGNOSIS — L600 Ingrowing nail: Secondary | ICD-10-CM

## 2021-12-27 NOTE — Progress Notes (Signed)
°  Subjective:  Patient ID: Kathleen Everett, female    DOB: [birth year],  MRN: [id number]  Chief Complaint  Patient presents with   Ingrown Toenail    Bilateral nail check    51 y.o. female presents with the above complaint. History confirmed with patient.  Returns for follow-up of ingrown nail procedure doing well not having much pain at all  Objective:  Physical Exam: warm, good capillary refill, no trophic changes or ulcerative lesions, normal DP and PT pulses, and normal sensory exam.  Matricectomy sites are healing well Assessment:   1. Ingrowing left great toenail   2. Ingrowing right great toenail       Plan:  Patient was evaluated and treated and all questions answered.    Ingrown Nail, bilaterally -Doing very well she can discontinue soaks and ointment at this point and leave open to air.  Advised may take another couple weeks for it to normalize and heal up completely.  Return if symptoms worsen or fail to improve.

## 2021-12-31 ENCOUNTER — Encounter: Payer: Self-pay | Admitting: Internal Medicine

## 2021-12-31 ENCOUNTER — Telehealth (INDEPENDENT_AMBULATORY_CARE_PROVIDER_SITE_OTHER): Payer: Medicare HMO | Admitting: Internal Medicine

## 2021-12-31 DIAGNOSIS — R051 Acute cough: Secondary | ICD-10-CM | POA: Diagnosis not present

## 2021-12-31 MED ORDER — PREDNISONE 10 MG PO TABS: ORAL_TABLET | ORAL | 0 refills | Status: DC

## 2021-12-31 MED ORDER — DOXYCYCLINE HYCLATE 100 MG PO TABS: 100.0000 mg | ORAL_TABLET | Freq: Two times a day (BID) | ORAL | 0 refills | Status: DC

## 2021-12-31 NOTE — Telephone Encounter (Signed)
LMTCB. If returns call please schedule virtual visit for 11:30 today to discuss

## 2021-12-31 NOTE — Assessment & Plan Note (Addendum)
Increased cough and congestion (nasal and head) as outlined.  Associated earache.  No chest pain or sob.  Has been using astelin nasal spray, dayquil and advil cold and sinus.  Still stop the otc dayquil and advil cold and sinus.  Start mucinex DM. Saline nasal spray.  Given abx allergies, treat with doxycycline.  Probiotics as directed.  Prednisone taper as directed.  Rest. Fluids.  Call with update.  If ear does not improve, will require ENT evaluation.  Discussed PCR nasal swab.  She will take home covid test.  Discussed quarantine guidelines.

## 2021-12-31 NOTE — Progress Notes (Signed)
Patient ID: Kathleen Everett, female   DOB: 05/26/1971, 51 y.o.   MRN: [id number]   Virtual Visit via video Note  This visit type was conducted due to national recommendations for restrictions regarding the COVID-19 pandemic (e.g. social distancing).  This format is felt to be most appropriate for this patient at this time.  All issues noted in this document were discussed and addressed.  No physical exam was performed (except for noted visual exam findings with Video Visits).   I connected with Kathleen Everett by a video enabled telemedicine application and verified that I am speaking with the correct person using two identifiers. Location patient: home Location provider: work  Persons participating in the virtual visit: patient, provider  The limitations, risks, security and privacy concerns of performing an evaluation and management service by video and the availability of in person appointments have been discussed. It has also been discussed with the patient that there may be a patient responsible charge related to this service. The patient expressed understanding and agreed to proceed.   Reason for visit: work in.    HPI: Work in for cough and congestion.  Husband was sick two weeks ago.  Her symptoms started 7-10 days ago.  Recently had sore throat.  Throat is better now.  Previous hoarseness and laryngitis.  This is better.  Does have increased nasal congestion - productive of yellow and blood tinged mucus.  Increased cough - productive of colored mucus.  No chest congestion, chest tightness or sob.  Feels the cough is coming from drainage.  Also reports increased earache - left.  She has been holding her ear and blowing.  Feels may have aggravated her ear.  Some drainage.  Can hear, but sounds muffled.  Has been taking dayquil, advil cold and sinus.  No nausea or vomiting.  No diarrhea.  No fever.    ROS: See pertinent positives and negatives per HPI.  Past Medical History:   Diagnosis Date   Abdominal pain 07/24/2014   Abscess of right hip 05/11/2020   Anemia 01/02/2020   Ankle pain, right 04/24/2014   Breast pain, right 09/08/2014   Chest pain 02/15/2015   Chiari malformation type I 01/26/2014   s/p sgy decompression   Chronic back pain 12/13/2012   Chronic gastritis 07/18/2015   Chronic tension-type headache, intractable 04/24/2015   DDD (degenerative disc disease), lumbar    Diverticulitis    Dysmenorrhea    Dysplastic nevus 10/09/2015   R abdomen - moderate, limited margins free.   Endometriosis    Generalized anxiety disorder 05/01/2015   Has previously seen psychiatry. Having some issues with what she feels is related to stress and anxiety   GERD (gastroesophageal reflux disease)    Hemorrhoids 12/23/2020   Hidradenitis    History of ovarian cyst    Hot flashes 03/12/2015   Hypercholesteremia 01/10/2014   Off lipitor. Elevated cholesterol. Labs discussed. Restart lipitor. Follow lipid panel and liver function tests.   Hyperglycemia 06/24/2019   Major depressive disorder 05/01/2015   Migraine headaches 01/10/2014   Has seen neurology. Topamax.   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 Level is 21.6    Past Surgical History:  Procedure Laterality Date   ABDOMINAL HYSTERECTOMY  03/27/07   laparoscopic   BREAST EXCISIONAL BIOPSY Left 2004   fibroadenoma   CESAREAN SECTION     1999/2004 twins   chiari decompression     COLONOSCOPY WITH PROPOFOL N/A 04/28/2015   Procedure: COLONOSCOPY WITH  PROPOFOL;  Surgeon: Manya Silvas, MD;  Location: [facility] ENDOSCOPY;  Service: Endoscopy;  Laterality: N/A;   COLONOSCOPY WITH PROPOFOL N/A 03/19/2021   Procedure: COLONOSCOPY WITH PROPOFOL;  Surgeon: Lesly Rubenstein, MD;  Location: [facility] ENDOSCOPY;  Service: Endoscopy;  Laterality: N/A;   CYSTOSCOPY     bladder ulcers   ESOPHAGOGASTRODUODENOSCOPY N/A 04/28/2015   Procedure: ESOPHAGOGASTRODUODENOSCOPY (EGD);  Surgeon: Manya Silvas, MD;  Location: [facility] ENDOSCOPY;  Service: Endoscopy;  Laterality: N/A;   ESOPHAGOGASTRODUODENOSCOPY (EGD) WITH PROPOFOL N/A 03/19/2021   Procedure: ESOPHAGOGASTRODUODENOSCOPY (EGD) WITH PROPOFOL;  Surgeon: Lesly Rubenstein, MD;  Location: [facility] ENDOSCOPY;  Service: Endoscopy;  Laterality: N/A;   HERNIA REPAIR Right 2003   inguinal   INCISION AND DRAINAGE HIP Right 05/11/2020   Procedure: IRRIGATION AND DEBRIDEMENT HIP;  Surgeon: Hessie Knows, MD;  Location: [facility] ORS;  Service: Orthopedics;  Laterality: Right;   OVARIAN CYST REMOVAL  2001   right   TEMPOROMANDIBULAR JOINT SURGERY  2000   TOTAL HIP ARTHROPLASTY Right 12/28/2019   Procedure: TOTAL HIP ARTHROPLASTY ANTERIOR APPROACH;  Surgeon: Hessie Knows, MD;  Location: [facility] ORS;  Service: Orthopedics;  Laterality: Right;   TUBAL LIGATION     Bilateral    Family History  Problem Relation Age of Onset   Hyperlipidemia Mother    Anxiety disorder Mother    Atrial fibrillation Mother    Hypertension Mother    Memory loss Mother    Anxiety disorder Father    Stroke Father    Hyperlipidemia Father    Hypertension Father    Congestive Heart Failure Father    Depression Father    Hyperlipidemia Brother    Colon cancer Paternal Aunt    Dementia Paternal Aunt    Dementia Maternal Grandmother    Dementia Paternal Grandmother    ADD / ADHD Daughter    Breast cancer Neg Hx     SOCIAL HX: reviewed.    Current Outpatient Medications:    doxycycline (VIBRA-TABS) 100 MG tablet, Take 1 tablet (100 mg total)  by mouth 2 (two) times daily., Disp: 20 tablet, Rfl: 0   predniSONE (DELTASONE) 10 MG tablet, Take 4 tablets x 1 day and then decrease by 1/2 tablet per day until down to zero mg., Disp: 18 tablet, Rfl: 0   acetaminophen (TYLENOL) 500 MG tablet, Take 500-1,000 mg by mouth 2 (two) times daily as needed for mild pain. , Disp: , Rfl:    AIMOVIG 140 MG/ML SOAJ, Inject 140 mg into the skin every 28 (twenty-eight) days., Disp: , Rfl:    atorvastatin (LIPITOR) 10 MG tablet, Take 1 tablet (10 mg total) by mouth daily., Disp: 90 tablet, Rfl: 3   azelastine (ASTELIN) 0.1 % nasal spray, Place 1 spray into both nostrils 2 (two) times daily. Use in each nostril as directed, Disp: 30 mL, Rfl: 1   clobetasol (TEMOVATE) 0.05 % external solution, Apply to aa's scalp QD PRN. Avoid the face, groin, and axilla. Avoid  applying to face, groin, and axilla. Use as directed. Risk of skin atrophy with long-term use reviewed., Disp: 50 mL, Rfl: 3   clotrimazole-betamethasone (LOTRISONE) cream, Apply 1 application topically 2 (two) times daily., Disp: 30 g, Rfl: 0   cyanocobalamin (,VITAMIN B-12,) 1000 MCG/ML injection, INJECT 1 ML INTRAMUSCULARLY ONCE EVERY MONTH, Disp: 10 mL, Rfl: 0   diclofenac sodium (VOLTAREN) 1 % GEL, Apply 1 application topically 4 (four) times daily as needed (pain.). , Disp: , Rfl:    erythromycin with ethanol (THERAMYCIN) 2 % external solution, Apply topically 2 (two) times daily. As needed to affected areas of skin (pustules and bumps), Disp: 60 mL, Rfl: 3   esomeprazole (NEXIUM) 40 MG capsule, Take 1 capsule by mouth once daily, Disp: 90 capsule, Rfl: 3   estradiol (ESTRACE VAGINAL) 0.1 MG/GM vaginal cream, One applicator q hs x 5 nights and then 2x/week. (Patient taking differently: Place 1 Applicatorful vaginally once a week.), Disp: 42.5 g, Rfl: 1   gabapentin (NEURONTIN) 400 MG capsule, Take by mouth., Disp: , Rfl:    ketoconazole (NIZORAL) 2 % shampoo, Shampoo into scalp let sit 5-10 mins then  wash out. Use 3d/wk., Disp: 120 mL, Rfl: 3   lactase (RA DAIRY AID) 3000 units tablet, Take 9,000 Units by mouth 3 (three) times daily as needed (with dairy consumption). , Disp: , Rfl:    methocarbamol (ROBAXIN) 500 MG tablet, Take 500 mg by mouth 4 (four) times daily., Disp: , Rfl:    methylphenidate (RITALIN) 20 MG tablet, Take 1 tablet (20 mg total) by mouth 2 (two) times daily with breakfast and lunch., Disp: 60 tablet, Rfl: 0   mupirocin ointment (BACTROBAN) 2 %, Apply to healing wound TID., Disp: 22 g, Rfl: 1   naloxone (NARCAN) 2 MG/2ML injection, Inject into the vein as needed., Disp: , Rfl:    NEOMYCIN-POLYMYXIN-HYDROCORTISONE (CORTISPORIN) 1 % SOLN OTIC solution, Apply to nail beds from procedure site twice daily after soaks, Disp: 10 mL, Rfl: 0   nystatin cream (MYCOSTATIN), Apply topically 2 (two) times daily., Disp: 30 g, Rfl: 0   Oxycodone HCl 10 MG TABS, Take 10 mg by mouth 5 (five) times daily. , Disp: , Rfl: 0   QUEtiapine (SEROQUEL XR) 50 MG TB24 24 hr tablet, Take 1 tablet (50 mg total) by mouth at bedtime., Disp: 30 tablet, Rfl: 1   rizatriptan (MAXALT) 10 MG tablet, Take 10 mg by mouth as needed for migraine. May repeat in 2 hours if needed, Disp: , Rfl:    Semaglutide, 2 MG/DOSE, 8 MG/3ML SOPN, Inject 2 mg as directed once a week., Disp: 9 mL, Rfl: 2   sertraline (ZOLOFT) 100 MG tablet, Take 2 tablets (200 mg total) by mouth daily., Disp: 180 tablet, Rfl: 0   Simethicone (GAS RELIEF PO), Take 125 mg by mouth 2 (two) times daily as needed for flatulence. Anti gas, Disp: , Rfl:    tacrolimus (PROTOPIC) 0.1 % ointment, Apply topically 2 (two) times daily., Disp: 100 g, Rfl: 2   triamcinolone cream (KENALOG) 0.1 %, APPLY  CREAM EXTERNALLY TWICE DAILY TO AFFECTED AREA ON  FINGERS, Disp: 30 g, Rfl: 0   Ubrogepant (UBRELVY) 100 MG TABS, Take 100 mg by mouth 2 (two) times daily as needed., Disp: , Rfl:   EXAM:  GENERAL: alert, oriented, appears well and in no acute  distress  HEENT: atraumatic, conjunttiva clear, no obvious abnormalities on inspection of external nose and ears  NECK: normal movements of the head and  neck  LUNGS: on inspection no signs of respiratory distress, breathing rate appears normal, no obvious gross SOB, gasping or wheezing  CV: no obvious cyanosis  PSYCH/NEURO: pleasant and cooperative, no obvious depression or anxiety, speech and thought processing grossly intact  ASSESSMENT AND PLAN:  Discussed the following assessment and plan:  Problem List Items Addressed This Visit     Cough    Increased cough and congestion (nasal and head) as outlined.  Associated earache.  No chest pain or sob.  Has been using astelin nasal spray, dayquil and advil cold and sinus.  Still stop the otc dayquil and advil cold and sinus.  Start mucinex DM. Saline nasal spray.  Given abx allergies, treat with doxycycline.  Probiotics as directed.  Prednisone taper as directed.  Rest. Fluids.  Call with update.  If ear does not improve, will require ENT evaluation.  Discussed PCR nasal swab.  She will take home covid test.  Discussed quarantine guidelines.         Return if symptoms worsen or fail to improve, for keep scheduled.   I discussed the assessment and treatment plan with the patient. The patient was provided an opportunity to ask questions and all were answered. The patient agreed with the plan and demonstrated an understanding of the instructions.   The patient was advised to call back or seek an in-person evaluation if the symptoms worsen or if the condition fails to improve as anticipated.   [name], MD

## 2022-01-01 MED ORDER — AZITHROMYCIN 250 MG PO TABS: ORAL_TABLET | ORAL | 0 refills | Status: AC

## 2022-01-01 NOTE — Telephone Encounter (Signed)
Patient says that she has not vomited anymore today. She vomited twice last night between 1am and 4:30. Confirmed doing ok. Patient is tolerating prednisone ok. Has not ate much today. She did eat some saltine crackers and was able to keep down. Drinking. Resting. Not moving around much. She is unsure about keflex or omnicef but she knows that she has taken zpak before and did ok with it.

## 2022-01-01 NOTE — Telephone Encounter (Signed)
I have sent in the prescription for zpak.  She is to take this and stay off doxycycline.  Let us know if persistent symptoms and call with update.

## 2022-01-01 NOTE — Telephone Encounter (Signed)
Please call and confirm doing ok.  I can call in zofran if she needs something to help with nausea.  She has taken doxycycline previously and did not have any problems.  With her multiple drug allergies, we are limited on abx to use.  She is currently holding doxycycline. Can see how she does today with GI symptoms.  Call with update this pm.  Confirm if she has taken a zpak previously and tolerated.  Also, see if has ever taken keflex or omnicef and tolerated.

## 2022-01-02 ENCOUNTER — Institutional Professional Consult (permissible substitution): Payer: Medicare HMO | Admitting: Primary Care

## 2022-01-03 ENCOUNTER — Telehealth (HOSPITAL_BASED_OUTPATIENT_CLINIC_OR_DEPARTMENT_OTHER): Payer: Medicare HMO | Admitting: Psychiatry

## 2022-01-03 ENCOUNTER — Encounter (HOSPITAL_COMMUNITY): Payer: Self-pay | Admitting: Psychiatry

## 2022-01-03 ENCOUNTER — Other Ambulatory Visit: Payer: Self-pay

## 2022-01-03 ENCOUNTER — Encounter: Payer: Self-pay | Admitting: Internal Medicine

## 2022-01-03 DIAGNOSIS — F411 Generalized anxiety disorder: Secondary | ICD-10-CM | POA: Diagnosis not present

## 2022-01-03 DIAGNOSIS — F331 Major depressive disorder, recurrent, moderate: Secondary | ICD-10-CM | POA: Diagnosis not present

## 2022-01-03 MED ORDER — BENZONATATE 100 MG PO CAPS: 100.0000 mg | ORAL_CAPSULE | Freq: Three times a day (TID) | ORAL | 0 refills | Status: DC | PRN

## 2022-01-03 MED ORDER — SERTRALINE HCL 100 MG PO TABS: 200.0000 mg | ORAL_TABLET | Freq: Every day | ORAL | 0 refills | Status: DC

## 2022-01-03 MED ORDER — METHYLPHENIDATE HCL 20 MG PO TABS: 20.0000 mg | ORAL_TABLET | Freq: Every day | ORAL | 0 refills | Status: DC

## 2022-01-03 NOTE — Telephone Encounter (Signed)
If agreeable, can send in tessalon perles 100mg  tid prn cough #30 with no refills.  If any problems, let us know.

## 2022-01-03 NOTE — Telephone Encounter (Signed)
Patient is aware. Rx for tessalon perles sent in

## 2022-01-03 NOTE — Progress Notes (Signed)
Virtual Visit via Video Note  I connected with Kathleen Everett on 01/03/22 at  1:15 PM EST by a video enabled telemedicine application and verified that I am speaking with the correct person using two identifiers.  Location: Patient: at home Provider: office   I discussed the limitations of evaluation and management by telemedicine and the availability of in person appointments. The patient expressed understanding and agreed to proceed.  History of Present Illness: "I have been doing better". Kathleen Everett is having some sinus problems and is still recovering. She is not able to sleep well and is feeling very tired and with poor appetite.  She took the Seroquel XR for less than 2 weeks and then stopped because she could not tolerate the GI side effects. She was very depressed around Christmas and stopped the large majority of her meds including Zoloft. She was unmotivated and was not taking care of herself. A friend confronted her and it was then Kathleen Everett realized how depressed she really was. She restarted Zoloft in early January and her depression is slowly improving. She has been taking Ritalin 20mg  qAM and could not tolerate the GI upset with the afternoon dose. In the last 2 weeks she has felt depressed every day. The average level is 5/10 (10 being the worst). She is dealing with some behaviors from her daughter. She denies passive thoughts of death and SI/HI. Her anxiety is ongoing but manageable. She has racing thoughts and worries about things that she can't control or cataphoraszing everything.    Observations/Objective: Psychiatric Specialty Exam: ROS  Last menstrual period 12/08/2006.There is no height or weight on file to calculate BMI.  General Appearance: Casual and Fairly Groomed  Eye Contact:  Good  Speech:  Clear and Coherent and Normal Rate  Volume:  Normal  Mood:  Anxious and Depressed  Affect:  Congruent  Thought Process:  Goal Directed, Linear, and Descriptions of  Associations: Intact  Orientation:  Full (Time, Place, and Person)  Thought Content:  Logical  Suicidal Thoughts:  No  Homicidal Thoughts:  No  Memory:  Immediate;   Good  Judgement:  Good  Insight:  Good  Psychomotor Activity:  Normal  Concentration:  Concentration: Good  Recall:  Good  Fund of Knowledge:  Good  Language:  Good  Akathisia:  No  Handed:  Right  AIMS (if indicated):     Assets:  Communication Skills Desire for Improvement Financial Resources/Insurance Housing Resilience Social Support Talents/Skills Transportation Vocational/Educational  ADL's:  Intact  Cognition:  WNL  Sleep:        Assessment and Plan: Depression screen Kathleen Everett 2/9 01/03/2022 09/20/2021 08/23/2021 06/28/2021 05/31/2021  Decreased Interest - 3 3 3 2   Down, Depressed, Hopeless 3 2 3 3 2   PHQ - 2 Score 3 5 6 6 4   Altered sleeping 3 3 3 3 3   Tired, decreased energy 3 3 3 3 3   Change in appetite 3 3 2 1 1   Feeling bad or failure about yourself  3 3 3 3 3   Trouble concentrating 2 2 3 3 3   Moving slowly or fidgety/restless 0 0 0 0 -  Suicidal thoughts 0 0 0 0 1  PHQ-9 Score 17 19 20 19 18   Difficult doing work/chores Very difficult Very difficult Very difficult Very difficult Extremely dIfficult  Some recent data might be hidden    Flowsheet Row Video Visit from 01/03/2022 in [facility] Video Visit from 09/20/2021 in [facility] Office Visit from 08/23/2021 in [facility] No Risk Error: Q3, 4, or 5 should not be populated when Q2 is No Error: Q3, 4, or 5 should not be populated when Q2 is No      - d/c Seroquel - decrease Ritalin 20mg  po qD  She does not want try any new meds at this time.  1. MDD (major depressive disorder), recurrent episode, moderate (HCC) - methylphenidate (RITALIN) 20 MG tablet; Take 1 tablet (20 mg total) by mouth daily with  breakfast.  Dispense: 30 tablet; Refill: 0 - sertraline (ZOLOFT) 100 MG tablet; Take 2 tablets (200 mg total) by mouth daily.  Dispense: 180 tablet; Refill: 0 - methylphenidate (RITALIN) 20 MG tablet; Take 1 tablet (20 mg total) by mouth daily.  Dispense: 30 tablet; Refill: 0  2. Generalized anxiety disorder - sertraline (ZOLOFT) 100 MG tablet; Take 2 tablets (200 mg total) by mouth daily.  Dispense: 180 tablet; Refill: 0    Follow Up Instructions: In 2-3 months or sooner if needed   I discussed the assessment and treatment plan with the patient. The patient was provided an opportunity to ask questions and all were answered. The patient agreed with the plan and demonstrated an understanding of the instructions.   The patient was advised to call back or seek an in-person evaluation if the symptoms worsen or if the condition fails to improve as anticipated.  I provided 15 minutes of non-face-to-face time during this encounter.   Charlcie Cradle, MD

## 2022-01-08 ENCOUNTER — Other Ambulatory Visit: Payer: Medicare HMO

## 2022-02-04 ENCOUNTER — Telehealth: Payer: Self-pay

## 2022-02-04 NOTE — Telephone Encounter (Signed)
Called and spoke to patient who has OSA but has lost weight and would like to revaluate.  ?

## 2022-02-05 ENCOUNTER — Other Ambulatory Visit: Payer: Self-pay

## 2022-02-05 ENCOUNTER — Ambulatory Visit (INDEPENDENT_AMBULATORY_CARE_PROVIDER_SITE_OTHER): Payer: Medicare HMO | Admitting: Primary Care

## 2022-02-05 ENCOUNTER — Encounter: Payer: Self-pay | Admitting: Primary Care

## 2022-02-05 DIAGNOSIS — M5137 Other intervertebral disc degeneration, lumbosacral region: Secondary | ICD-10-CM | POA: Diagnosis not present

## 2022-02-05 DIAGNOSIS — M47816 Spondylosis without myelopathy or radiculopathy, lumbar region: Secondary | ICD-10-CM | POA: Diagnosis not present

## 2022-02-05 DIAGNOSIS — M62830 Muscle spasm of back: Secondary | ICD-10-CM | POA: Diagnosis not present

## 2022-02-05 DIAGNOSIS — G44209 Tension-type headache, unspecified, not intractable: Secondary | ICD-10-CM | POA: Diagnosis not present

## 2022-02-05 DIAGNOSIS — G2581 Restless legs syndrome: Secondary | ICD-10-CM | POA: Diagnosis not present

## 2022-02-05 DIAGNOSIS — M5412 Radiculopathy, cervical region: Secondary | ICD-10-CM | POA: Diagnosis not present

## 2022-02-05 DIAGNOSIS — G4733 Obstructive sleep apnea (adult) (pediatric): Secondary | ICD-10-CM

## 2022-02-05 DIAGNOSIS — M5441 Lumbago with sciatica, right side: Secondary | ICD-10-CM | POA: Diagnosis not present

## 2022-02-05 DIAGNOSIS — M25551 Pain in right hip: Secondary | ICD-10-CM | POA: Diagnosis not present

## 2022-02-05 DIAGNOSIS — M5442 Lumbago with sciatica, left side: Secondary | ICD-10-CM | POA: Diagnosis not present

## 2022-02-05 DIAGNOSIS — M7918 Myalgia, other site: Secondary | ICD-10-CM | POA: Diagnosis not present

## 2022-02-05 NOTE — Progress Notes (Signed)
? ?'@Patient'$  ID: Kathleen Everett, female    DOB: Feb 03, 1971, 51 y.o.   MRN: [id number] ? ?No chief complaint on file. ? ? ?Referring provider: ?[name], MD ? ?HPI: ?51 year old female, never smoked. PMH significant for OSA, migraine headaches, GAD, chronic back pain, hypercholesteremia, endometriosis.  ? ?02/05/2022 ?Patient presents today for sleep consult. Hx sleep apnea, she was off and on cpap between 2016-2018. She has lost 75 lbs. Original sleep study on 07/11/2015 showed mild OSA, AHI 6.1 (REM/AHI 23.8). She underwent Cpap titration on 08/08/15 showed optimal pressure was 7cm h20. Total AHI 32.3/hr. She has been off CPAP for the last 6-8 months. Feels she is sleeping well. She has been told that she no longer snores. She will occasionally still wake herself up at night. 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 Takes oxycodone for chronic pain.  Denies narcolepsy, cataplexy or sleep walking.  ? ?Sleep questionnaire ?Symptoms-  Restless sleep ?Prior sleep study-  2016, [place] ?Bedtime- 9-10:30pm ?Time to fall asleep-  1 hour ?Nocturnal awakenings- 2-3 times ?Out of bed/start of day- Depends on how she has slept ?Weight changes- Decreased 75 lbs ?Do you operate heavy machinery- No ?Do you currently wear CPAP- Not currently  ?Do you current wear oxygen- No  ?Epworth- 4 ? ? ?Allergies  ?Allergen Reactions  ? Vancomycin Hives  ? Ceftin [Cefuroxime Axetil] Other (See Comments)  ?  Upset stomach  ? Celexa [Citalopram Hydrobromide] Diarrhea  ?  GI upset  ?  ? Clindamycin/Lincomycin Rash  ? Penicillins Rash  ?  Did it involve swelling of the face/tongue/throat, SOB, or low BP? No ?Did it involve sudden or severe rash/hives, skin peeling, or any reaction on the inside of your mouth or nose? Unknown ?Did you need to seek medical attention at a hospital or doctor's office? No ?When did it last happen? childhood reaction ?If all above answers are ?NO?, may proceed with cephalosporin use. ?  ? Sulfa  Antibiotics Rash  ? ? ? ?There is no immunization history on file for this patient. ? ?Past Medical History:  ?Diagnosis Date  ? Abdominal pain 07/24/2014  ? Abscess of right hip 05/11/2020  ? Anemia 01/02/2020  ? Ankle pain, right 04/24/2014  ? Breast pain, right 09/08/2014  ? Chest pain 02/15/2015  ? 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 s/p sgy decompression  ? Chronic back pain 12/13/2012  ? Chronic gastritis 07/18/2015  ? Chronic tension-type headache, intractable 04/24/2015  ? DDD (degenerative disc disease), lumbar   ? Diverticulitis   ? Dysmenorrhea   ? Dysplastic nevus 10/09/2015  ? R abdomen - moderate, limited margins free.  ? Endometriosis   ? Generalized anxiety disorder 05/01/2015  ? Has previously seen psychiatry. Having some issues with what she feels is related to stress and anxiety  ? GERD (gastroesophageal reflux disease)   ? Hemorrhoids 12/23/2020  ? Hidradenitis   ? History of ovarian cyst   ? Hot flashes 03/12/2015  ? Hypercholesteremia 01/10/2014  ? Off lipitor. Elevated cholesterol. Labs discussed. Restart lipitor. Follow lipid panel and liver function tests.  ? Hyperglycemia 06/24/2019  ? Major depressive disorder 05/01/2015  ? Migraine headaches 01/10/2014  ? Has seen neurology. Topamax.  ? Mitral valve prolapse   ? Obesity   ? Obstructive sleep apnea 01/10/2014  ? nightly CPAP use  ? PONV (postoperative nausea and vomiting)   ? Primary osteoarthritis of right hip 01/17/2016  ? Right hip pain 01/10/2014  ? Soft  tissue mass 03/03/2021  ? 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 TMJ (dislocation of temporomandibular joint)   ? Vitamin B12 deficiency   ? On injections  ? Vitamin D deficiency 01/18/2014  ? Overview:  Level is 21.6  Formatting of this note might be different from the original. Level is 21.6  ? ? ?Tobacco History: ?Social History  ? ?Tobacco Use  ?Smoking Status Never  ?Smokeless Tobacco Never  ? ?Counseling given: Not Answered ? ? ?Outpatient Medications Prior to Visit  ?Medication  Sig Dispense Refill  ? acetaminophen (TYLENOL) 500 MG tablet Take 500-1,000 mg by mouth 2 (two) times daily as needed for mild pain.     ? AIMOVIG 140 MG/ML SOAJ Inject 140 mg into the skin every 28 (twenty-eight) days.    ? atorvastatin (LIPITOR) 10 MG tablet Take 1 tablet (10 mg total) by mouth daily. 90 tablet 3  ? azelastine (ASTELIN) 0.1 % nasal spray Place 1 spray into both nostrils 2 (two) times daily. Use in each nostril as directed 30 mL 1  ? clobetasol (TEMOVATE) 0.05 % external solution Apply to aa's scalp QD PRN. Avoid the face, groin, and axilla. Avoid applying to face, groin, and axilla. Use as directed. Risk of skin atrophy with long-term use reviewed. 50 mL 3  ? clotrimazole-betamethasone (LOTRISONE) cream Apply 1 application topically 2 (two) times daily. 30 g 0  ? cyanocobalamin (,VITAMIN B-12,) 1000 MCG/ML injection INJECT 1 ML INTRAMUSCULARLY ONCE EVERY MONTH 10 mL 0  ? diclofenac sodium (VOLTAREN) 1 % GEL Apply 1 application topically 4 (four) times daily as needed (pain.).     ? erythromycin with ethanol (THERAMYCIN) 2 % external solution Apply topically 2 (two) times daily. As needed to affected areas of skin (pustules and bumps) 60 mL 3  ? esomeprazole (NEXIUM) 40 MG capsule Take 1 capsule by mouth once daily 90 capsule 3  ? estradiol (ESTRACE VAGINAL) 0.1 MG/GM vaginal cream One applicator q hs x 5 nights and then 2x/week. (Patient taking differently: Place 1 Applicatorful vaginally once a week.) 42.5 g 1  ? lactase (RA DAIRY AID) 3000 units tablet Take 9,000 Units by mouth 3 (three) times daily as needed (with dairy consumption).     ? methocarbamol (ROBAXIN) 500 MG tablet Take 500 mg by mouth 4 (four) times daily.    ? methylphenidate (RITALIN) 20 MG tablet Take 1 tablet (20 mg total) by mouth daily with breakfast. 30 tablet 0  ? methylphenidate (RITALIN) 20 MG tablet Take 1 tablet (20 mg total) by mouth daily. 30 tablet 0  ? mupirocin ointment (BACTROBAN) 2 % Apply to healing wound TID.  22 g 1  ? naloxone (NARCAN) 2 MG/2ML injection Inject into the vein as needed.    ? NEOMYCIN-POLYMYXIN-HYDROCORTISONE (CORTISPORIN) 1 % SOLN OTIC solution Apply to nail beds from procedure site twice daily after soaks 10 mL 0  ? nystatin cream (MYCOSTATIN) Apply topically 2 (two) times daily. 30 g 0  ? rizatriptan (MAXALT) 10 MG tablet Take 10 mg by mouth as needed for migraine. May repeat in 2 hours if needed    ? Semaglutide, 2 MG/DOSE, 8 MG/3ML SOPN Inject 2 mg as directed once a week. 9 mL 2  ? Simethicone (GAS RELIEF PO) Take 125 mg by mouth 2 (two) times daily as needed for flatulence. Anti gas    ? tacrolimus (PROTOPIC) 0.1 % ointment Apply topically 2 (two) times daily. 100 g 2  ? Ubrogepant (UBRELVY) 100 MG TABS  Take 100 mg by mouth 2 (two) times daily as needed.    ? benzonatate (TESSALON) 100 MG capsule Take 1 capsule (100 mg total) by mouth 3 (three) times daily as needed for cough. 30 capsule 0  ? doxycycline (VIBRA-TABS) 100 MG tablet Take 1 tablet (100 mg total) by mouth 2 (two) times daily. 20 tablet 0  ? ketoconazole (NIZORAL) 2 % shampoo Shampoo into scalp let sit 5-10 mins then wash out. Use 3d/wk. 120 mL 3  ? Oxycodone HCl 10 MG TABS Take 10 mg by mouth 5 (five) times daily.   0  ? predniSONE (DELTASONE) 10 MG tablet Take 4 tablets x 1 day and then decrease by 1/2 tablet per day until down to zero mg. 18 tablet 0  ? triamcinolone cream (KENALOG) 0.1 % APPLY  CREAM EXTERNALLY TWICE DAILY TO AFFECTED AREA ON  FINGERS 30 g 0  ? gabapentin (NEURONTIN) 400 MG capsule Take by mouth.    ? sertraline (ZOLOFT) 100 MG tablet Take 2 tablets (200 mg total) by mouth daily. 180 tablet 0  ? ?No facility-administered medications prior to visit.  ? ? ?Review of Systems ? ?Review of Systems  ?Constitutional:  Negative for fatigue.  ?HENT:  Positive for postnasal drip.   ?Respiratory: Negative.    ?Psychiatric/Behavioral:  Positive for sleep disturbance.   ? ? ?Physical Exam ? ?LMP 12/08/2006  ?Physical  Exam ?Constitutional:   ?   Appearance: Normal appearance.  ?HENT:  ?   Head: Normocephalic and atraumatic.  ?   Mouth/Throat:  ?   Mouth: Mucous membranes are moist.  ?   Pharynx: Oropharynx is clear.  ?Cardiovascular:

## 2022-02-05 NOTE — Patient Instructions (Signed)
?Sleep apnea is defined as period of 10 seconds or longer when you stop breathing at night. This can happen multiple times a night. Dx sleep apnea is when this occurs more than 5 times an hour.  ?  ?Mild OSA 5-15 apneic events an hour ?Moderate OSA 15-30 apneic events an hour ?Severe OSA > 30 apneic events an hour ?  ?Untreated sleep apnea puts you at higher risk for cardiac arrhythmias, pulmonary HTN, stroke and diabetes ?  ?Treatment options include weight loss, side sleeping position, oral appliance, CPAP therapy or referral to ENT for possible surgical options  ?  ?Recommendations: ?Focus on side sleeping position or elevate head of bed  ?Maintain normal BMI range/work on weight loss efforts  ?Do not drive if experiencing excessive daytime sleepiness of fatigue  ?  ?Orders: ?Home sleep study re: sleep apnea  ?  ?Follow-up: ?4-6 week visit to review sleep study results and discuss treatment options further ? ?Sleep Apnea ?Sleep apnea is a condition in which breathing pauses or becomes shallow during sleep. People with sleep apnea usually snore loudly. They may have times when they gasp and stop breathing for 10 seconds or more during sleep. This may happen many times during the night. ?Sleep apnea disrupts your sleep and keeps your body from getting the rest that it needs. This condition can increase your risk of certain health problems, including: ?Heart attack. ?Stroke. ?Obesity. ?Type 2 diabetes. ?Heart failure. ?Irregular heartbeat. ?High blood pressure. ?The goal of treatment is to help you breathe normally again. ?What are the causes? ?The most common cause of sleep apnea is a collapsed or blocked airway. ?There are three kinds of sleep apnea: ?Obstructive sleep apnea. This kind is caused by a blocked or collapsed airway. ?Central sleep apnea. This kind happens when the part of the brain that controls breathing does not send the correct signals to the muscles that control breathing. ?Mixed sleep apnea.  This is a combination of obstructive and central sleep apnea. ?What increases the risk? ?You are more likely to develop this condition if you: ?Are overweight. ?Smoke. ?Have a smaller than normal airway. ?Are older. ?Are female. ?Drink alcohol. ?Take sedatives or tranquilizers. ?Have a family history of sleep apnea. ?Have a tongue or tonsils that are larger than normal. ?What are the signs or symptoms? ?Symptoms of this condition include: ?Trouble staying asleep. ?Loud snoring. ?Morning headaches. ?Waking up gasping. ?Dry mouth or sore throat in the morning. ?Daytime sleepiness and tiredness. ?If you have daytime fatigue because of sleep apnea, you may be more likely to have: ?Trouble concentrating. ?Forgetfulness. ?Irritability or mood swings. ?Personality changes. ?Feelings of depression. ?Sexual dysfunction. This may include loss of interest if you are female, or erectile dysfunction if you are female. ?How is this diagnosed? ?This condition may be diagnosed with: ?A medical history. ?A physical exam. ?A series of tests that are done while you are sleeping (sleep study). These tests are usually done in a sleep lab, but they may also be done at home. ?How is this treated? ?Treatment for this condition aims to restore normal breathing and to ease symptoms during sleep. It may involve managing health issues that can affect breathing, such as high blood pressure or obesity. Treatment may include: ?Sleeping on your side. ?Using a decongestant if you have nasal congestion. ?Avoiding the use of depressants, including alcohol, sedatives, and narcotics. ?Losing weight if you are overweight. ?Making changes to your diet. ?Quitting smoking. ?Using a device to open your airway while  you sleep, such as: ?An oral appliance. This is a custom-made mouthpiece that shifts your lower jaw forward. ?A continuous positive airway pressure (CPAP) device. This device blows air through a mask when you breathe out (exhale). ?A nasal expiratory  positive airway pressure (EPAP) device. This device has valves that you put into each nostril. ?A bi-level positive airway pressure (BIPAP) device. This device blows air through a mask when you breathe in (inhale) and breathe out (exhale). ?Having surgery if other treatments do not work. During surgery, excess tissue is removed to create a wider airway. ?Follow these instructions at home: ?Lifestyle ?Make any lifestyle changes that your health care provider recommends. ?Eat a healthy, well-balanced diet. ?Take steps to lose weight if you are overweight. ?Avoid using depressants, including alcohol, sedatives, and narcotics. ?Do not use any products that contain nicotine or tobacco. These products include cigarettes, chewing tobacco, and vaping devices, such as e-cigarettes. If you need help quitting, ask your health care provider. ?General instructions ?Take over-the-counter and prescription medicines only as told by your health care provider. ?If you were given a device to open your airway while you sleep, use it only as told by your health care provider. ?If you are having surgery, make sure to tell your health care provider you have sleep apnea. You may need to bring your device with you. ?Keep all follow-up visits. This is important. ?Contact a health care provider if: ?The device that you received to open your airway during sleep is uncomfortable or does not seem to be working. ?Your symptoms do not improve. ?Your symptoms get worse. ?Get help right away if: ?You develop: ?Chest pain. ?Shortness of breath. ?Discomfort in your back, arms, or stomach. ?You have: ?Trouble speaking. ?Weakness on one side of your body. ?Drooping in your face. ?These symptoms may represent a serious problem that is an emergency. Do not wait to see if the symptoms will go away. Get medical help right away. Call your local emergency services (911 in the [place] Do not drive yourself to the hospital. ?Summary ?Sleep apnea is a condition  in which breathing pauses or becomes shallow during sleep. ?The most common cause is a collapsed or blocked airway. ?The goal of treatment is to restore normal breathing and to ease symptoms during sleep. ?This information is not intended to replace advice given to you by your health care provider. Make sure you discuss any questions you have with your health care provider. ?Document Revised: 06/13/2021 Document Reviewed: 10/13/2020 ?Elsevier Patient Education ? 2022 [place]. ? ? ?

## 2022-02-05 NOTE — Assessment & Plan Note (Signed)
-   Hx moderate-severe sleep apnea, no longer on CPAP. She has lost 75 lbs since sleep study in 2016. Needs repeat HST to re-evaluate. Discussed risk of untreated sleep apnea including cardiac arrhythmias, pulm HTN, stroke, DM. We briefly reviewed treatment options. Encouraged patient to continue to work on weight loss efforts and focus on side sleeping position/elevate head of bed. Advised against driving if experiencing excessive daytime sleepiness. Follow-up in 4-6 weeks to review sleep study results and discuss treatment options further. ?

## 2022-02-06 NOTE — Progress Notes (Signed)
Reviewed and agree with assessment/plan. ? ? ?Chesley Mires, MD ?[place] ?02/06/2022, 8:05 AM ?Pager:  [phone] ? ?

## 2022-02-07 ENCOUNTER — Encounter: Payer: Self-pay | Admitting: Internal Medicine

## 2022-02-08 ENCOUNTER — Ambulatory Visit (INDEPENDENT_AMBULATORY_CARE_PROVIDER_SITE_OTHER): Payer: Medicare HMO | Admitting: Internal Medicine

## 2022-02-08 ENCOUNTER — Other Ambulatory Visit (HOSPITAL_COMMUNITY)
Admission: RE | Admit: 2022-02-08 | Discharge: 2022-02-08 | Disposition: A | Discharge status: Home / Self Care | Payer: Medicare HMO | Source: Ambulatory Visit | Attending: Internal Medicine | Admitting: Internal Medicine

## 2022-02-08 ENCOUNTER — Other Ambulatory Visit: Payer: Self-pay

## 2022-02-08 VITALS — BP 112/72 | HR 70 | Temp 97.9°F | Resp 16 | Ht 70.0 in | Wt 227.0 lb

## 2022-02-08 DIAGNOSIS — K219 Gastro-esophageal reflux disease without esophagitis: Secondary | ICD-10-CM | POA: Diagnosis not present

## 2022-02-08 DIAGNOSIS — G935 Compression of brain: Secondary | ICD-10-CM

## 2022-02-08 DIAGNOSIS — N898 Other specified noninflammatory disorders of vagina: Secondary | ICD-10-CM | POA: Diagnosis not present

## 2022-02-08 DIAGNOSIS — M545 Low back pain, unspecified: Secondary | ICD-10-CM

## 2022-02-08 DIAGNOSIS — F325 Major depressive disorder, single episode, in full remission: Secondary | ICD-10-CM

## 2022-02-08 DIAGNOSIS — E7800 Pure hypercholesterolemia, unspecified: Secondary | ICD-10-CM | POA: Diagnosis not present

## 2022-02-08 DIAGNOSIS — H938X2 Other specified disorders of left ear: Secondary | ICD-10-CM

## 2022-02-08 DIAGNOSIS — G4733 Obstructive sleep apnea (adult) (pediatric): Secondary | ICD-10-CM | POA: Diagnosis not present

## 2022-02-08 DIAGNOSIS — G8929 Other chronic pain: Secondary | ICD-10-CM | POA: Diagnosis not present

## 2022-02-08 MED ORDER — METHYLPREDNISOLONE 4 MG PO TBPK: ORAL_TABLET | ORAL | 0 refills | Status: DC

## 2022-02-08 MED ORDER — AZITHROMYCIN 250 MG PO TABS: ORAL_TABLET | ORAL | 0 refills | Status: AC

## 2022-02-08 NOTE — Progress Notes (Signed)
Patient ID: Kathleen Everett, female   DOB: 1971-01-03, 51 y.o.   MRN: [id number] ? ? ?Subjective:  ? ? Patient ID: Kathleen Everett, female    DOB: 17-Feb-1971, 51 y.o.   MRN: [id number] ? ?This visit occurred during the SARS-CoV-2 public health emergency.  Safety protocols were in place, including screening questions prior to the visit, additional usage of staff PPE, and extensive cleaning of exam room while observing appropriate contact time as indicated for disinfecting solutions.  ? ?Patient here for work in appt.  ? ?Chief Complaint  ?Patient presents with  ? Vaginitis  ? left ear pain  ? .  ? ?HPI ?Work in - vaginal irritation/pain.  Persistent "cut" on left labia minora. Saw gyn.  HSV - negative. Treated with cocunut oil and vaseline.  Also using estrace cream.  Appeared to improve some previously, but worsening now.  Has been back on the above regimen.  Worsening symptoms.  Has noticed some vaginal discharge recently - white discharge.  No abdominal pain.  No urinary symptoms.  Does report left ear fullness.  Treated previously with abx.  Better, but not completely resolved.  Left hear discomfort/fullness. Some nasal/sinus congestion.  No chest congestion, cough or sob.  ? ? ?Past Medical History:  ?Diagnosis Date  ? Abdominal pain 07/24/2014  ? Abscess of right hip 05/11/2020  ? Anemia 01/02/2020  ? Ankle pain, right 04/24/2014  ? Breast pain, right 09/08/2014  ? Chest pain 02/15/2015  ? 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 s/p sgy decompression  ? Chronic back pain 12/13/2012  ? Chronic gastritis 07/18/2015  ? Chronic tension-type headache, intractable 04/24/2015  ? DDD (degenerative disc disease), lumbar   ? Diverticulitis   ? Dysmenorrhea   ? Dysplastic nevus 10/09/2015  ? R abdomen - moderate, limited margins free.  ? Endometriosis   ? Generalized anxiety disorder 05/01/2015  ? Has previously seen psychiatry. Having some issues with what she feels is related to stress and anxiety  ? GERD (gastroesophageal  reflux disease)   ? Hemorrhoids 12/23/2020  ? Hidradenitis   ? History of ovarian cyst   ? Hot flashes 03/12/2015  ? Hypercholesteremia 01/10/2014  ? Off lipitor. Elevated cholesterol. Labs discussed. Restart lipitor. Follow lipid panel and liver function tests.  ? Hyperglycemia 06/24/2019  ? Major depressive disorder 05/01/2015  ? Migraine headaches 01/10/2014  ? Has seen neurology. Topamax.  ? Mitral valve prolapse   ? Obesity   ? Obstructive sleep apnea 01/10/2014  ? nightly CPAP use  ? PONV (postoperative nausea and vomiting)   ? Primary osteoarthritis of right hip 01/17/2016  ? Right hip pain 01/10/2014  ? Soft tissue mass 03/03/2021  ? 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 TMJ (dislocation of temporomandibular joint)   ? Vitamin B12 deficiency   ? On injections  ? Vitamin D deficiency 01/18/2014  ? Overview:  Level is 21.6  Formatting of this note might be different from the original. Level is 21.6  ? ?Past Surgical History:  ?Procedure Laterality Date  ? ABDOMINAL HYSTERECTOMY  03/27/07  ? laparoscopic  ? BREAST EXCISIONAL BIOPSY Left 2004  ? fibroadenoma  ? CESAREAN SECTION    ? 1999/2004 twins  ? chiari decompression    ? COLONOSCOPY WITH PROPOFOL N/A 04/28/2015  ? Procedure: COLONOSCOPY WITH PROPOFOL;  Surgeon: Manya Silvas, MD;  Location[facility] ENDOSCOPY;  Service: Endoscopy;  Laterality: N/A;  ? COLONOSCOPY WITH PROPOFOL N/A 03/19/2021  ? Procedure: COLONOSCOPY WITH  PROPOFOL;  Surgeon: Lesly Rubenstein, MD;  Location: [facility] ENDOSCOPY;  Service: Endoscopy;  Laterality: N/A;  ? CYSTOSCOPY    ? bladder ulcers  ? ESOPHAGOGASTRODUODENOSCOPY N/A 04/28/2015  ? Procedure: ESOPHAGOGASTRODUODENOSCOPY (EGD);  Surgeon: Manya Silvas, MD;  Location: [facility] ENDOSCOPY;  Service: Endoscopy;  Laterality: N/A;  ? ESOPHAGOGASTRODUODENOSCOPY (EGD) WITH PROPOFOL N/A 03/19/2021  ? Procedure: ESOPHAGOGASTRODUODENOSCOPY (EGD) WITH PROPOFOL;  Surgeon: Lesly Rubenstein, MD;  Location: [facility] ENDOSCOPY;  Service: Endoscopy;   Laterality: N/A;  ? HERNIA REPAIR Right 2003  ? inguinal  ? INCISION AND DRAINAGE HIP Right 05/11/2020  ? Procedure: IRRIGATION AND DEBRIDEMENT HIP;  Surgeon: Hessie Knows, MD;  Location: [facility] ORS;  Service: Orthopedics;  Laterality: Right;  ? OVARIAN CYST REMOVAL  2001  ? right  ? TEMPOROMANDIBULAR JOINT SURGERY  2000  ? TOTAL HIP ARTHROPLASTY Right 12/28/2019  ? Procedure: TOTAL HIP ARTHROPLASTY ANTERIOR APPROACH;  Surgeon: Hessie Knows, MD;  Location: [facility] ORS;  Service: Orthopedics;  Laterality: Right;  ? TUBAL LIGATION    ? Bilateral  ? ?Family History  ?Problem Relation Age of Onset  ? Hyperlipidemia Mother   ? Anxiety disorder Mother   ? Atrial fibrillation Mother   ? Hypertension Mother   ? Memory loss Mother   ? Anxiety disorder Father   ? Stroke Father   ? Hyperlipidemia Father   ? Hypertension Father   ? Congestive Heart Failure Father   ? Depression Father   ? Hyperlipidemia Brother   ? Colon cancer Paternal Aunt   ? Dementia Paternal Aunt   ? Dementia Maternal Grandmother   ? Dementia Paternal Grandmother   ? ADD / ADHD Daughter   ? Breast cancer Neg Hx   ? ?Social History  ? ?Socioeconomic History  ? Marital status: Married  ?  Spouse name: Not on file  ? Number of children: 3  ? Years of education: 75  ? Highest education level: Some college, no degree  ?Occupational History  ? Occupation: Disability  ?Tobacco Use  ? Smoking status: Never  ? Smokeless tobacco: Never  ?Vaping Use  ? Vaping Use: Never used  ?Substance and Sexual Activity  ? Alcohol use: Not Currently  ?  Alcohol/week: 0.0 standard drinks  ? Drug use: No  ? Sexual activity: Yes  ?  Birth control/protection: None  ?Other Topics Concern  ? Not on file  ?Social History Narrative  ? Patient drinks 2-3 cups of caffeine daily.  ? Patient is right handed.   ?   ? Social Hx:  ? Current living situation- [place]. Living with husband and 3 kids  ? Born and Raised- [place].raised by both parents.   ? Siblings- 1 brother who is 7 yrs older than her  ?  Legal issues- none  ? Married for 24 yrs. Currently unemployed since 2011 when she hurt her back. HS grad and CNA and phlebotomy.   ? ?Social Determinants of Health  ? ?Financial Resource Strain: Low Risk   ? Difficulty of Paying Living Expenses: Not hard at all  ?Food Insecurity: Not on file  ?Transportation Needs: Not on file  ?Physical Activity: Not on file  ?Stress: Not on file  ?Social Connections: Not on file  ? ? ? ?Review of Systems  ?Constitutional:  Negative for appetite change and unexpected weight change.  ?HENT:  Positive for congestion.   ?     Ear fullness and congestion as outlined.   ?Respiratory:  Negative for cough, chest tightness and shortness of  breath.   ?Cardiovascular:  Negative for chest pain, palpitations and leg swelling.  ?Gastrointestinal:  Negative for abdominal pain, diarrhea, nausea and vomiting.  ?Genitourinary:  Negative for difficulty urinating and dysuria.  ?     Vaginal irritation as outlined.   ?Musculoskeletal:  Negative for joint swelling and myalgias.  ?Neurological:  Negative for dizziness, light-headedness and headaches.  ?Psychiatric/Behavioral:  Negative for agitation and dysphoric mood.   ? ?   ?Objective:  ?  ? ?BP 112/72   Pulse 70   Temp 97.9 ?F (36.6 ?C)   Resp 16   Ht '5\' 10"'$  (1.778 m)   Wt 227 lb (103 kg)   LMP 12/08/2006   SpO2 98%   BMI 32.57 kg/m?  ?Wt Readings from Last 3 Encounters:  ?02/08/22 227 lb (103 kg)  ?12/31/21 230 lb 6.4 oz (104.5 kg)  ?11/21/21 238 lb 9.6 oz (108.2 kg)  ? ? ?Physical Exam ?Vitals reviewed.  ?Constitutional:   ?   General: She is not in acute distress. ?   Appearance: Normal appearance.  ?HENT:  ?   Head: Normocephalic and atraumatic.  ?   Right Ear: External ear normal.  ?   Left Ear: External ear normal.  ?   Ears:  ?   Comments: TM - question of fluid - posterior TM left.  No erythema.  Negative cerumen impaction.  ?Eyes:  ?   General: No scleral icterus.    ?   Right eye: No discharge.     ?   Left eye: No discharge.  ?    Conjunctiva/sclera: Conjunctivae normal.  ?Neck:  ?   Thyroid: No thyromegaly.  ?Cardiovascular:  ?   Rate and Rhythm: Normal rate and regular rhythm.  ?Pulmonary:  ?   Effort: No respiratory distress.  ?   B

## 2022-02-09 ENCOUNTER — Encounter: Payer: Self-pay | Admitting: Internal Medicine

## 2022-02-09 DIAGNOSIS — H938X9 Other specified disorders of ear, unspecified ear: Secondary | ICD-10-CM | POA: Insufficient documentation

## 2022-02-09 NOTE — Assessment & Plan Note (Signed)
S/p surgery.  Has been followed by neurology.  ?

## 2022-02-09 NOTE — Assessment & Plan Note (Signed)
Persistent fullness.  Sinus congestion.  Fluid as outlined.  Medrol dosepack.  Zpak.  Follow.  If persistent problems, refer to ENT.  ?

## 2022-02-09 NOTE — Assessment & Plan Note (Signed)
Saw gyn.  Using coconut oil and vaseline.  Using estrogen cream.  Treating sinus/ear.  KOH/wet prep obtained secondary to discharge present.  F/u with gyn.  ?

## 2022-02-09 NOTE — Assessment & Plan Note (Signed)
No upper symptoms reported.  Nexium.  

## 2022-02-09 NOTE — Assessment & Plan Note (Signed)
Followed by pain clinic.  

## 2022-02-09 NOTE — Assessment & Plan Note (Signed)
On lipitor.  Low cholesterol diet and exercise.  Follow lipid panel and liver function tests.   

## 2022-02-09 NOTE — Assessment & Plan Note (Signed)
Saw pulmonary.  Planning for HST.  ?

## 2022-02-09 NOTE — Assessment & Plan Note (Signed)
Seeing psychiatry.  Overall appears to be doing well on current medication regimen.   ?

## 2022-02-11 LAB — CERVICOVAGINAL ANCILLARY ONLY
Bacterial Vaginitis (gardnerella): NEGATIVE
Candida Glabrata: NEGATIVE
Candida Vaginitis: NEGATIVE
Comment: NEGATIVE
Comment: NEGATIVE
Comment: NEGATIVE

## 2022-02-12 ENCOUNTER — Other Ambulatory Visit: Payer: Self-pay

## 2022-02-12 DIAGNOSIS — N898 Other specified noninflammatory disorders of vagina: Secondary | ICD-10-CM

## 2022-02-13 NOTE — Progress Notes (Signed)
? ? ?Kathleen Pheasant, MD ? ? ?Chief Complaint  ?Patient presents with  ? Vaginal Lesion  ?  Got a little better after last visit  ? Vaginal Discharge  ?  Itching, abnormal odor  ? ? ?HPI: ?     Kathleen Everett is a 51 y.o. [phone] whose LMP was Patient's last menstrual period was 12/08/2006., presents today for vaginal lesion f/u from 1/23, referred back by PCP. Pt with hx of chronic vag irritation since 11/22, not responsive to yeast meds. Initially had what looked like a slow-healing tear at 1/23 appt. Had neg HSV culture, instructed to treat with estrace crm ext QHS for 2 wks and use coconut oil/vaseline in AM for moisturizer. Ext tissue was dry and could be irritated from friction. Pt states sx improved slightly after appt and with tx but then worsened again. Pt feels like tear then got bigger again, particularly with yeast vag sx due to abx tx. Pt has since been on abx tx 2/23 with diflucan due to vaginal itching sx. Pt saw PCP 02/08/22 and had neg neg BV and yeast culture after tx. Was started on zpak and predpak at that appt for sinus congestion/ear pain. Pt states lesion is smaller now that on oral pred. Having increased white d/c with irritation again after new abx use, as well as dysuria due to irritation, no other urin sx. No fishy odor. No meds to treat this time.  ? ? ?Patient Active Problem List  ? Diagnosis Date Noted  ? Ear fullness 02/09/2022  ? Runny nose 09/30/2021  ? Dizziness 07/22/2021  ? Vitamin B12 deficiency   ? Vaginal irritation 03/03/2021  ? Soft tissue mass 03/03/2021  ? Hemorrhoids 12/23/2020  ? DDD (degenerative disc disease), lumbar 10/03/2020  ? Abscess of right hip 05/11/2020  ? Healthcare maintenance 01/02/2020  ? Anemia 01/02/2020  ? 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 Hyperglycemia 06/24/2019  ? Vaginal bleeding 09/30/2016  ? Primary osteoarthritis of right hip 01/17/2016  ? Chronic gastritis 07/18/2015  ? Depression, major, in remission ([place])  05/01/2015  ? Generalized anxiety disorder 05/01/2015  ? Chronic tension-type headache, intractable 04/24/2015  ? Dysmenorrhea 04/24/2015  ? Endometriosis 04/24/2015  ? Hidradenitis 04/24/2015  ? Hot flashes 03/12/2015  ? Chest pain 02/15/2015  ? BMI 35.0-35.9,adult 10/16/2014  ? Cough 09/28/2014  ? Breast pain, right 09/08/2014  ? Abdominal pain 07/24/2014  ? GERD (gastroesophageal reflux disease) 04/24/2014  ? Ankle pain, right 04/24/2014  ? 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 Vitamin D deficiency 01/18/2014  ? Diverticulitis 01/10/2014  ? Hypercholesteremia 01/10/2014  ? Right hip pain 01/10/2014  ? Migraine headaches 01/10/2014  ? Obstructive sleep apnea 01/10/2014  ? Chronic back pain 12/13/2012  ? Mitral valve prolapse 12/13/2012  ? ? ?Past Surgical History:  ?Procedure Laterality Date  ? ABDOMINAL HYSTERECTOMY  03/27/07  ? laparoscopic  ? BREAST EXCISIONAL BIOPSY Left 2004  ? fibroadenoma  ? CESAREAN SECTION    ? 1999/2004 twins  ? chiari decompression    ? COLONOSCOPY WITH PROPOFOL N/A 04/28/2015  ? Procedure: COLONOSCOPY WITH PROPOFOL;  Surgeon: Manya Silvas, MD;  Location: [facility] ENDOSCOPY;  Service: Endoscopy;  Laterality: N/A;  ? COLONOSCOPY WITH PROPOFOL N/A 03/19/2021  ? Procedure: COLONOSCOPY WITH PROPOFOL;  Surgeon: Lesly Rubenstein, MD;  Location: [facility] ENDOSCOPY;  Service: Endoscopy;  Laterality: N/A;  ? CYSTOSCOPY    ? bladder ulcers  ? ESOPHAGOGASTRODUODENOSCOPY N/A 04/28/2015  ? Procedure: ESOPHAGOGASTRODUODENOSCOPY (EGD);  Surgeon: Manya Silvas, MD;  Location: [facility] ENDOSCOPY;  Service: Endoscopy;  Laterality: N/A;  ? ESOPHAGOGASTRODUODENOSCOPY (EGD) WITH PROPOFOL N/A 03/19/2021  ? Procedure: ESOPHAGOGASTRODUODENOSCOPY (EGD) WITH PROPOFOL;  Surgeon: Lesly Rubenstein, MD;  Location: [facility] ENDOSCOPY;  Service: Endoscopy;  Laterality: N/A;  ? HERNIA REPAIR Right 2003  ? inguinal  ? INCISION AND DRAINAGE HIP Right 05/11/2020  ? Procedure: IRRIGATION AND DEBRIDEMENT HIP;  Surgeon: Hessie Knows, MD;  Location: [facility] ORS;  Service: Orthopedics;  Laterality: Right;  ? OVARIAN CYST REMOVAL  2001  ? right  ? TEMPOROMANDIBULAR JOINT SURGERY  2000  ? TOTAL HIP ARTHROPLASTY Right 12/28/2019  ? Procedure: TOTAL HIP ARTHROPLASTY ANTERIOR APPROACH;  Surgeon: Hessie Knows, MD;  Location: [facility] ORS;  Service: Orthopedics;  Laterality: Right;  ? TUBAL LIGATION    ? Bilateral  ? ? ?Family History  ?Problem Relation Age of Onset  ? Hyperlipidemia Mother   ? Anxiety disorder Mother   ? Atrial fibrillation Mother   ? Hypertension Mother   ? Memory loss Mother   ? Anxiety disorder Father   ? Stroke Father   ? Hyperlipidemia Father   ? Hypertension Father   ? Congestive Heart Failure Father   ? Depression Father   ? Hyperlipidemia Brother   ? Colon cancer Paternal Aunt   ? Dementia Paternal Aunt   ? Dementia Maternal Grandmother   ? Dementia Paternal Grandmother   ? ADD / ADHD Daughter   ? Breast cancer Neg Hx   ? ? ?Social History  ? ?Socioeconomic History  ? Marital status: Married  ?  Spouse name: Not on file  ? Number of children: 3  ? Years of education: 84  ? Highest education level: Some college, no degree  ?Occupational History  ? Occupation: Disability  ?Tobacco Use  ? Smoking status: Never  ? Smokeless tobacco: Never  ?Vaping Use  ? Vaping Use: Never used  ?Substance and Sexual Activity  ? Alcohol use: Not Currently  ?  Alcohol/week: 0.0 standard drinks  ? Drug use: No  ? Sexual activity: Yes  ?  Birth control/protection: Surgical  ?  Comment: Hysterectomy  ?Other Topics Concern  ? Not on file  ?Social History Narrative  ? Patient drinks 2-3 cups of caffeine daily.  ? Patient is right handed.   ?   ? Social Hx:  ? Current living situation- [place]. Living with husband and 3 kids  ? Born and Raised- [place].raised by both parents.   ? Siblings- 1 brother who is 7 yrs older than her  ? Legal issues- none  ? Married for 24 yrs. Currently unemployed since 2011 when she hurt her back. HS grad and CNA and phlebotomy.    ? ?Social Determinants of Health  ? ?Financial Resource Strain: Low Risk   ? Difficulty of Paying Living Expenses: Not hard at all  ?Food Insecurity: Not on file  ?Transportation Needs: Not on file  ?Physical Activity: Not on file  ?Stress: Not on file  ?Social Connections: Not on file  ?Intimate Partner Violence: Not on file  ? ? ?Outpatient Medications Prior to Visit  ?Medication Sig Dispense Refill  ? AIMOVIG 140 MG/ML SOAJ Inject 140 mg into the skin every 28 (twenty-eight) days.    ? atorvastatin (LIPITOR) 10 MG tablet Take 1 tablet (10 mg total) by mouth daily. 90 tablet 3  ? azelastine (ASTELIN) 0.1 % nasal spray Place 1 spray into both nostrils 2 (two) times daily. Use in each nostril  as directed 30 mL 1  ? cyanocobalamin (,VITAMIN B-12,) 1000 MCG/ML injection INJECT 1 ML INTRAMUSCULARLY ONCE EVERY MONTH 10 mL 0  ? diclofenac sodium (VOLTAREN) 1 % GEL Apply 1 application topically 4 (four) times daily as needed (pain.).     ? erythromycin with ethanol (THERAMYCIN) 2 % external solution Apply topically 2 (two) times daily. As needed to affected areas of skin (pustules and bumps) 60 mL 3  ? esomeprazole (NEXIUM) 40 MG capsule Take 1 capsule by mouth once daily 90 capsule 3  ? lactase (RA DAIRY AID) 3000 units tablet Take 9,000 Units by mouth 3 (three) times daily as needed (with dairy consumption).     ? methocarbamol (ROBAXIN) 500 MG tablet Take 500 mg by mouth 4 (four) times daily.    ? methylphenidate (RITALIN) 20 MG tablet Take 1 tablet (20 mg total) by mouth daily with breakfast. 30 tablet 0  ? methylPREDNISolone (MEDROL DOSEPAK) 4 MG TBPK tablet Medrol dosepack 6 day taper.  Take as directed. 21 tablet 0  ? mupirocin ointment (BACTROBAN) 2 % Apply to healing wound TID. 22 g 1  ? naloxone (NARCAN) 2 MG/2ML injection Inject into the vein as needed.    ? nystatin cream (MYCOSTATIN) Apply topically 2 (two) times daily. 30 g 0  ? oxyCODONE-acetaminophen (PERCOCET) 10-325 MG tablet Take by mouth.    ?  rizatriptan (MAXALT) 10 MG tablet Take 10 mg by mouth as needed for migraine. May repeat in 2 hours if needed    ? Semaglutide, 2 MG/DOSE, 8 MG/3ML SOPN Inject 2 mg as directed once a week. 9 mL 2  ? Simethicone (GAS REL

## 2022-02-14 ENCOUNTER — Encounter: Payer: Self-pay | Admitting: Obstetrics and Gynecology

## 2022-02-14 ENCOUNTER — Ambulatory Visit (INDEPENDENT_AMBULATORY_CARE_PROVIDER_SITE_OTHER): Payer: Medicare HMO | Admitting: Obstetrics and Gynecology

## 2022-02-14 VITALS — BP 146/70 | Ht 70.0 in | Wt 227.0 lb

## 2022-02-14 DIAGNOSIS — N898 Other specified noninflammatory disorders of vagina: Secondary | ICD-10-CM | POA: Diagnosis not present

## 2022-02-14 LAB — POCT WET PREP WITH KOH
Clue Cells Wet Prep HPF POC: NEGATIVE
KOH Prep POC: NEGATIVE
Trichomonas, UA: NEGATIVE
Yeast Wet Prep HPF POC: NEGATIVE

## 2022-02-14 MED ORDER — FLUCONAZOLE 150 MG PO TABS
150.0000 mg | ORAL_TABLET | Freq: Once | ORAL | 0 refills | Status: AC
Start: 2022-02-14 — End: 2022-02-14

## 2022-02-14 MED ORDER — CLOTRIMAZOLE-BETAMETHASONE 1-0.05 % EX CREA: TOPICAL_CREAM | CUTANEOUS | 0 refills | Status: DC

## 2022-02-14 NOTE — Patient Instructions (Signed)
I value your feedback and you entrusting us with your care. If you get a  patient survey, I would appreciate you taking the time to let us know about your experience today. Thank you! ? ? ?

## 2022-02-15 ENCOUNTER — Ambulatory Visit: Payer: Medicare HMO

## 2022-02-18 ENCOUNTER — Ambulatory Visit (INDEPENDENT_AMBULATORY_CARE_PROVIDER_SITE_OTHER): Payer: Medicare HMO

## 2022-02-18 ENCOUNTER — Encounter: Payer: Self-pay | Admitting: Obstetrics and Gynecology

## 2022-02-18 VITALS — Ht 70.0 in | Wt 227.0 lb

## 2022-02-18 DIAGNOSIS — Z0000 Encounter for general adult medical examination without abnormal findings: Secondary | ICD-10-CM

## 2022-02-18 NOTE — Progress Notes (Signed)
Subjective:   Kathleen Everett is a 51 y.o. female who presents for Medicare Annual (Subsequent) preventive examination.  Review of Systems    No ROS.  Medicare Wellness Virtual Visit.  Visual/audio telehealth visit, UTA vital signs.   See social history for additional risk factors.   Cardiac Risk Factors include: advanced age (>63men, >49 women)     Objective:    Today's Vitals   02/18/22 1406  Weight: 227 lb (103 kg)  Height: 5\' 10"  (1.778 m)   Body mass index is 32.57 kg/m.     02/18/2022    2:24 PM 03/19/2021   10:39 AM 03/17/2021    4:49 PM 05/11/2020   11:32 AM 02/14/2020    1:24 PM 12/28/2019   11:02 AM 12/02/2019   11:39 AM  Advanced Directives  Does Patient Have a Medical Advance Directive? No No No No No No No  Would patient like information on creating a medical advance directive? No - Patient declined  No - Patient declined No - Patient declined No - Patient declined No - Patient declined No - Guardian declined    Current Medications (verified) Outpatient Encounter Medications as of 02/18/2022  Medication Sig   AIMOVIG 140 MG/ML SOAJ Inject 140 mg into the skin every 28 (twenty-eight) days.   atorvastatin (LIPITOR) 10 MG tablet Take 1 tablet (10 mg total) by mouth daily.   azelastine (ASTELIN) 0.1 % nasal spray Place 1 spray into both nostrils 2 (two) times daily. Use in each nostril as directed   clotrimazole-betamethasone (LOTRISONE) cream Apply externally BID for 2 wks   cyanocobalamin (,VITAMIN B-12,) 1000 MCG/ML injection INJECT 1 ML INTRAMUSCULARLY ONCE EVERY MONTH   diclofenac sodium (VOLTAREN) 1 % GEL Apply 1 application topically 4 (four) times daily as needed (pain.).    erythromycin with ethanol (THERAMYCIN) 2 % external solution Apply topically 2 (two) times daily. As needed to affected areas of skin (pustules and bumps)   esomeprazole (NEXIUM) 40 MG capsule Take 1 capsule by mouth once daily   gabapentin (NEURONTIN) 400 MG capsule Take by mouth.    lactase (RA DAIRY AID) 3000 units tablet Take 9,000 Units by mouth 3 (three) times daily as needed (with dairy consumption).    methocarbamol (ROBAXIN) 500 MG tablet Take 500 mg by mouth 4 (four) times daily.   methylphenidate (RITALIN) 20 MG tablet Take 1 tablet (20 mg total) by mouth daily with breakfast.   methylPREDNISolone (MEDROL DOSEPAK) 4 MG TBPK tablet Medrol dosepack 6 day taper.  Take as directed.   mupirocin ointment (BACTROBAN) 2 % Apply to healing wound TID.   naloxone (NARCAN) 2 MG/2ML injection Inject into the vein as needed.   nystatin cream (MYCOSTATIN) Apply topically 2 (two) times daily.   oxyCODONE-acetaminophen (PERCOCET) 10-325 MG tablet Take by mouth.   rizatriptan (MAXALT) 10 MG tablet Take 10 mg by mouth as needed for migraine. May repeat in 2 hours if needed   Semaglutide, 2 MG/DOSE, 8 MG/3ML SOPN Inject 2 mg as directed once a week.   sertraline (ZOLOFT) 100 MG tablet Take 2 tablets (200 mg total) by mouth daily.   Simethicone (GAS RELIEF PO) Take 125 mg by mouth 2 (two) times daily as needed for flatulence. Anti gas   Ubrogepant (UBRELVY) 100 MG TABS Take 100 mg by mouth 2 (two) times daily as needed.   No facility-administered encounter medications on file as of 02/18/2022.   Allergies (verified) Vancomycin, Ceftin [cefuroxime axetil], Celexa [citalopram hydrobromide], Clindamycin/lincomycin, Penicillins, and  Sulfa antibiotics   History: Past Medical History:  Diagnosis Date   Abdominal pain 07/24/2014   Abscess of right hip 05/11/2020   Anemia 01/02/2020   Ankle pain, right 04/24/2014   Breast pain, right 09/08/2014   Chest pain 02/15/2015   Chiari malformation type I 01/26/2014   s/p sgy decompression   Chronic back pain 12/13/2012   Chronic gastritis 07/18/2015   Chronic tension-type headache, intractable 04/24/2015   DDD (degenerative disc disease), lumbar    Diverticulitis    Dysmenorrhea    Dysplastic nevus 10/09/2015   R abdomen - moderate, limited  margins free.   Endometriosis    Generalized anxiety disorder 05/01/2015   Has previously seen psychiatry. Having some issues with what she feels is related to stress and anxiety   GERD (gastroesophageal reflux disease)    Hemorrhoids 12/23/2020   Hidradenitis    History of ovarian cyst    Hot flashes 03/12/2015   Hypercholesteremia 01/10/2014   Off lipitor. Elevated cholesterol. Labs discussed. Restart lipitor. Follow lipid panel and liver function tests.   Hyperglycemia 06/24/2019   Major depressive disorder 05/01/2015   Migraine headaches 01/10/2014   Has seen neurology. Topamax.   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 Level is 21.6   Past Surgical History:  Procedure Laterality Date   ABDOMINAL HYSTERECTOMY  03/27/07   laparoscopic   BREAST EXCISIONAL BIOPSY Left 2004   fibroadenoma   CESAREAN SECTION     1999/2004 twins   chiari decompression     COLONOSCOPY WITH PROPOFOL N/A 04/28/2015   Procedure: COLONOSCOPY WITH PROPOFOL;  Surgeon: Scot Jun, MD;  Location[facility] ENDOSCOPY;  Service: Endoscopy;  Laterality: N/A;   COLONOSCOPY WITH PROPOFOL N/A 03/19/2021   Procedure: COLONOSCOPY WITH PROPOFOL;  Surgeon: Regis Bill, MD;  Location: [facility] ENDOSCOPY;  Service: Endoscopy;  Laterality: N/A;   CYSTOSCOPY     bladder ulcers   ESOPHAGOGASTRODUODENOSCOPY N/A 04/28/2015   Procedure: ESOPHAGOGASTRODUODENOSCOPY (EGD);  Surgeon: Scot Jun, MD;  Location: [facility] ENDOSCOPY;  Service: Endoscopy;  Laterality: N/A;    ESOPHAGOGASTRODUODENOSCOPY (EGD) WITH PROPOFOL N/A 03/19/2021   Procedure: ESOPHAGOGASTRODUODENOSCOPY (EGD) WITH PROPOFOL;  Surgeon: Regis Bill, MD;  Location: [facility] ENDOSCOPY;  Service: Endoscopy;  Laterality: N/A;   HERNIA REPAIR Right 2003   inguinal   INCISION AND DRAINAGE HIP Right 05/11/2020   Procedure: IRRIGATION AND DEBRIDEMENT HIP;  Surgeon: Kennedy Bucker, MD;  Location: [facility] ORS;  Service: Orthopedics;  Laterality: Right;   OVARIAN CYST REMOVAL  2001   right   TEMPOROMANDIBULAR JOINT SURGERY  2000   TOTAL HIP ARTHROPLASTY Right 12/28/2019   Procedure: TOTAL HIP ARTHROPLASTY ANTERIOR APPROACH;  Surgeon: Kennedy Bucker, MD;  Location: [facility] ORS;  Service: Orthopedics;  Laterality: Right;   TUBAL LIGATION     Bilateral   Family History  Problem Relation Age of Onset   Hyperlipidemia Mother    Anxiety disorder Mother    Atrial fibrillation Mother    Hypertension  Mother    Memory loss Mother    Anxiety disorder Father    Stroke Father    Hyperlipidemia Father    Hypertension Father    Congestive Heart Failure Father    Depression Father    Hyperlipidemia Brother    Colon cancer Paternal Aunt    Dementia Paternal Aunt    Dementia Maternal Grandmother    Dementia Paternal Grandmother    ADD / ADHD Daughter    Breast cancer Neg Hx    Social History   Socioeconomic History   Marital status: Married    Spouse name: Not on file   Number of children: 3   Years of education: 13   Highest education level: Some college, no degree  Occupational History   Occupation: Disability  Tobacco Use   Smoking status: Never   Smokeless tobacco: Never  Vaping Use   Vaping Use: Never used  Substance and Sexual Activity   Alcohol use: Not Currently    Alcohol/week: 0.0 standard drinks   Drug use: No   Sexual activity: Yes    Birth control/protection: Surgical    Comment: Hysterectomy  Other Topics Concern   Not on file  Social History Narrative   Patient drinks 2-3 cups  of caffeine daily.   Patient is right handed.       Social Hx:   Current living situation- [place]. Living with husband and 3 kids   Born and Raised- [place]raised by both parents.    Siblings- 1 brother who is 7 yrs older than her   Legal issues- none   Married for 24 yrs. Currently unemployed since 2011 when she hurt her back. HS grad and CNA and phlebotomy.    Social Determinants of Health   Financial Resource Strain: Low Risk    Difficulty of Paying Living Expenses: Not hard at all  Food Insecurity: No Food Insecurity   Worried About Programme researcher, broadcasting/film/video in the Last Year: Never true   Ran Out of Food in the Last Year: Never true  Transportation Needs: No Transportation Needs   Lack of Transportation (Medical): No   Lack of Transportation (Non-Medical): No  Physical Activity: Insufficiently Active   Days of Exercise per Week: 7 days   Minutes of Exercise per Session: 10 min  Stress: No Stress Concern Present   Feeling of Stress : Not at all  Social Connections: Socially Integrated   Frequency of Communication with Friends and Family: More than three times a week   Frequency of Social Gatherings with Friends and Family: More than three times a week   Attends Religious Services: More than 4 times per year   Active Member of [employer] or Organizations: Not on file   Attends Engineer, structural: More than 4 times per year   Marital Status: Married   Tobacco Counseling Counseling given: Not Answered  Clinical Intake: Pre-visit preparation completed: Yes        Diabetes: No  How often do you need to have someone help you when you read instructions, pamphlets, or other written materials from your doctor or pharmacy?: 1 - Never  Interpreter Needed?: No    Activities of Daily Living    02/18/2022    2:07 PM  In your present state of health, do you have any difficulty performing the following activities:  Hearing? 0  Vision? 0  Difficulty concentrating or making  decisions? 0  Walking or climbing stairs? 1  Comment Paces self. Cane/walker in use.  Dressing or bathing? 0  Doing errands, shopping? 1  Comment Family Ship broker and eating ? N  Using the Toilet? N  In the past six months, have you accidently leaked urine? N  Do you have problems with loss of bowel control? N  Managing your Medications? Y  Comment Family assist  Comment Family assist as needed  Housekeeping or managing your Housekeeping? Y  Comment Family assist as needed. Maid assist.   Patient Care Team: Dale Danbury, MD as PCP - General (Internal Medicine) Lonell Face, MD as Referring Physician (Neurology) Antonieta Iba, MD as Consulting Physician (Cardiology)  Indicate any recent Medical Services you may have received from other than [facility] providers in the past year (date may be approximate).     Assessment:   This is a routine wellness examination for Kathleen Everett.  Virtual Visit via Telephone Note  I connected with  Semika Zou on 02/18/22 at  2:00 PM EDT by telephone and verified that I am speaking with the correct person using two identifiers.  Persons participating in the virtual visit: patient/Nurse Health Advisor   I discussed the limitations of performing an evaluation and management service by telehealth. The patient expressed understanding and agreed to proceed. We continued and completed visit with audio only. Some vital signs may be absent or patient reported.   Hearing/Vision screen Hearing Screening - Comments:: Patient is able to hear conversational tones without difficulty. No issues reported. Vision Screening - Comments:: Followed by [facility]  Wears corrective lenses They have seen their ophthalmologist in the last 12 months.   Dietary issues and exercise activities discussed: Current Exercise Habits: Home exercise routine, Type of exercise: walking, Intensity: Mild Healthy diet   Goals Addressed             This  Visit's Progress    Follow up with Primary Care Provider       As needed.       Depression Screen    02/18/2022    2:14 PM 01/03/2022    1:28 PM 12/31/2021   10:53 AM 09/20/2021    9:13 AM 08/23/2021    1:17 PM 06/28/2021    1:12 PM 05/31/2021    9:41 AM  PHQ 2/9 Scores  PHQ - 2 Score         PHQ- 9 Score         Exception Documentation Other- indicate reason in comment box  Other- indicate reason in comment box      Not completed Psychiatry every 2 months and as needed.  sees psychiatry         Information is confidential and restricted. Go to Review Flowsheets to unlock data.    Fall Risk    02/18/2022    2:14 PM 02/14/2021    1:13 PM 06/26/2020    8:17 AM 02/14/2020    1:09 PM 06/19/2018   12:18 PM  Fall Risk   Falls in the past year? 0 0 1 1 Yes  Number falls in past yr: 0 0 1  2 or more  Injury with Fall?  0 0 0 No  Risk Factor Category      High Fall Risk  Risk for fall due to :  Impaired balance/gait History of fall(s)  Impaired balance/gait  Follow up Falls evaluation completed Falls evaluation completed Falls evaluation completed Falls prevention discussed;Falls evaluation completed    FALL RISK PREVENTION PERTAINING TO THE HOME: Home free of loose throw rugs  in walkways, pet beds, electrical cords, etc? Yes  Adequate lighting in your home to reduce risk of falls? Yes   ASSISTIVE DEVICES UTILIZED TO PREVENT FALLS: Life alert? No  Use of a cane, walker or w/c? Yes  Grab bars in the bathroom? Yes  Shower chair or bench in shower? Yes  Elevated toilet seat or a handicapped toilet? Yes   TIMED UP AND GO: Was the test performed? No .   Cognitive Function: Patient is alert and oriented x3.     02/14/2021    1:28 PM  MMSE - Mini Mental State Exam  Not completed: Unable to complete        02/18/2022    2:49 PM 02/14/2020    1:30 PM  6CIT Screen  What Year? 0 points 0 points  What month? 0 points 0 points  What time? 0 points 0 points  Count back from 20 0 points 0  points  Months in reverse 4 points 0 points  Repeat phrase 4 points 6 points  Total Score 8 points 6 points   Immunizations  There is no immunization history on file for this patient.  TDAP status: Due, Education has been provided regarding the importance of this vaccine. Advised may receive this vaccine at local pharmacy or Health Dept. Aware to provide a copy of the vaccination record if obtained from local pharmacy or Health Dept. Verbalized acceptance and understanding.Deferred.   Screening Tests Health Maintenance  Topic Date Due   TETANUS/TDAP  04/18/2022 (Originally 06/26/1990)   MAMMOGRAM  04/24/2022   INFLUENZA VACCINE  06/18/2022   PAP SMEAR-Modifier  06/23/2024   COLONOSCOPY (Pts 45-81yrs Insurance coverage will need to be confirmed)  03/20/2031   Hepatitis C Screening  Completed   HIV Screening  Completed   HPV VACCINES  Aged Out   COVID-19 Vaccine  Discontinued   Health Maintenance There are no preventive care reminders to display for this patient.  Lung Cancer Screening: (Low Dose CT Chest recommended if Age 73-80 years, 30 pack-year currently smoking OR have quit w/in 15years.) does not qualify.   Vision Screening: Recommended annual ophthalmology exams for early detection of glaucoma and other disorders of the eye.  Dental Screening: Recommended annual dental exams for proper oral hygiene  Community Resource Referral / Chronic Care Management: CRR required this visit?  No   CCM required this visit?  No      Plan:   Keep all routine maintenance appointments.   I have personally reviewed and noted the following in the patient's chart:   Medical and social history Use of alcohol, tobacco or illicit drugs  Current medications and supplements including opioid prescriptions.  Functional ability and status Nutritional status Physical activity Advanced directives List of other physicians Hospitalizations, surgeries, and ER visits in previous 12  months Vitals Screenings to include cognitive, depression, and falls Referrals and appointments  In addition, I have reviewed and discussed with patient certain preventive protocols, quality metrics, and best practice recommendations. A written personalized care plan for preventive services as well as general preventive health recommendations were provided to patient.     Ashok Pall, LPN   11/23/1094

## 2022-02-18 NOTE — Patient Instructions (Addendum)
?  Ms. Dockham , ?Thank you for taking time to come for your Medicare Wellness Visit. I appreciate your ongoing commitment to your health goals. Please review the following plan we discussed and let me know if I can assist you in the future.  ? ?These are the goals we discussed: ? Goals   ? ?  Follow up with Primary Care Provider   ?  As needed. ?  ? ?  ?  ?This is a list of the screening recommended for you and due dates:  ?Health Maintenance  ?Topic Date Due  ? Tetanus Vaccine  04/18/2022*  ? Mammogram  04/24/2022  ? Flu Shot  06/18/2022  ? Pap Smear  06/23/2024  ? Colon Cancer Screening  03/20/2031  ? Hepatitis C Screening: USPSTF Recommendation to screen - Ages 39-79 yo.  Completed  ? HIV Screening  Completed  ? HPV Vaccine  Aged Out  ? COVID-19 Vaccine  Discontinued  ?*Topic was postponed. The date shown is not the original due date.  ?  ?

## 2022-02-25 ENCOUNTER — Encounter: Payer: Self-pay | Admitting: Internal Medicine

## 2022-02-26 NOTE — Telephone Encounter (Signed)
If just one dose, I would just skip and resume taking at her next scheduled day ?

## 2022-03-04 ENCOUNTER — Ambulatory Visit (INDEPENDENT_AMBULATORY_CARE_PROVIDER_SITE_OTHER): Payer: Medicare HMO | Admitting: Obstetrics and Gynecology

## 2022-03-04 ENCOUNTER — Encounter: Payer: Self-pay | Admitting: Obstetrics and Gynecology

## 2022-03-04 ENCOUNTER — Other Ambulatory Visit (HOSPITAL_COMMUNITY)
Admission: RE | Admit: 2022-03-04 | Discharge: 2022-03-04 | Disposition: A | Discharge status: Home / Self Care | Payer: Medicare HMO | Source: Ambulatory Visit | Attending: Obstetrics and Gynecology | Admitting: Obstetrics and Gynecology

## 2022-03-04 VITALS — BP 130/80 | Ht 70.0 in | Wt 229.0 lb

## 2022-03-04 DIAGNOSIS — N901 Moderate vulvar dysplasia: Secondary | ICD-10-CM

## 2022-03-04 DIAGNOSIS — N761 Subacute and chronic vaginitis: Secondary | ICD-10-CM | POA: Diagnosis not present

## 2022-03-04 DIAGNOSIS — N898 Other specified noninflammatory disorders of vagina: Secondary | ICD-10-CM | POA: Diagnosis not present

## 2022-03-04 DIAGNOSIS — D072 Carcinoma in situ of vagina: Secondary | ICD-10-CM | POA: Diagnosis not present

## 2022-03-04 NOTE — Progress Notes (Signed)
? ? ?[name], MD ? ? ?Chief Complaint  ?Patient presents with  ? Follow-up  ?  Getting a little better, vaginal redness still there  ? ? ?HPI: ?     [name] is a 51 y.o. [phone] whose LMP was Patient's last menstrual period was 12/08/2006., presents today for f/u of painful vaginal area and a slow healing hole in LT labia minora; sx off and on for over a year. Has had neg yeast and BV cultures, neg HSV 2 testing. Has been seen here 1/23 and 3/23. Pt has tried nystatin crm, estrace crm, coconut oil, and vaseline without full relief. She couldn't tolerate lotrisone crm. Tissue feels a little better since 3/23 visit but still has intermittent vaginal itching/irritation. Hole seems to be mostly resolved; still feels like area is red. No increased vag d/c.  ?Sees Dr. Laurence Ferrari at [facility].  ? ?Patient Active Problem List  ? Diagnosis Date Noted  ? Ear fullness 02/09/2022  ? Runny nose 09/30/2021  ? Dizziness 07/22/2021  ? Vitamin B12 deficiency   ? Vaginal irritation 03/03/2021  ? Soft tissue mass 03/03/2021  ? Hemorrhoids 12/23/2020  ? DDD (degenerative disc disease), lumbar 10/03/2020  ? Abscess of right hip 05/11/2020  ? Healthcare maintenance 01/02/2020  ? Anemia 01/02/2020  ? 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 Hyperglycemia 06/24/2019  ? Vaginal bleeding 09/30/2016  ? Primary osteoarthritis of right hip 01/17/2016  ? Chronic gastritis 07/18/2015  ? Depression, major, in remission ([place]) 05/01/2015  ? Generalized anxiety disorder 05/01/2015  ? Chronic tension-type headache, intractable 04/24/2015  ? Dysmenorrhea 04/24/2015  ? Endometriosis 04/24/2015  ? Hidradenitis 04/24/2015  ? Hot flashes 03/12/2015  ? Chest pain 02/15/2015  ? BMI 35.0-35.9,adult 10/16/2014  ? Cough 09/28/2014  ? Breast pain, right 09/08/2014  ? Abdominal pain 07/24/2014  ? GERD (gastroesophageal reflux disease) 04/24/2014  ? Ankle pain, right 04/24/2014  ? 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  Vitamin D deficiency 01/18/2014  ? Diverticulitis 01/10/2014  ? Hypercholesteremia 01/10/2014  ? Right hip pain 01/10/2014  ? Migraine headaches 01/10/2014  ? Obstructive sleep apnea 01/10/2014  ? Chronic back pain 12/13/2012  ? Mitral valve prolapse 12/13/2012  ? ? ?Past Surgical History:  ?Procedure Laterality Date  ? ABDOMINAL HYSTERECTOMY  03/27/07  ? laparoscopic  ? BREAST EXCISIONAL BIOPSY Left 2004  ? fibroadenoma  ? CESAREAN SECTION    ? 1999/2004 twins  ? chiari decompression    ? COLONOSCOPY WITH PROPOFOL N/A 04/28/2015  ? Procedure: COLONOSCOPY WITH PROPOFOL;  Surgeon: Manya Silvas, MD;  Location: [facility] ENDOSCOPY;  Service: Endoscopy;  Laterality: N/A;  ? COLONOSCOPY WITH PROPOFOL N/A 03/19/2021  ? Procedure: COLONOSCOPY WITH PROPOFOL;  Surgeon: Lesly Rubenstein, MD;  Location: [facility] ENDOSCOPY;  Service: Endoscopy;  Laterality: N/A;  ? CYSTOSCOPY    ? bladder ulcers  ? ESOPHAGOGASTRODUODENOSCOPY N/A 04/28/2015  ? Procedure: ESOPHAGOGASTRODUODENOSCOPY (EGD);  Surgeon: Manya Silvas, MD;  Location: [facility] ENDOSCOPY;  Service: Endoscopy;  Laterality: N/A;  ? ESOPHAGOGASTRODUODENOSCOPY (EGD) WITH PROPOFOL N/A 03/19/2021  ? Procedure: ESOPHAGOGASTRODUODENOSCOPY (EGD) WITH PROPOFOL;  Surgeon: Lesly Rubenstein, MD;  Location: [facility] ENDOSCOPY;  Service: Endoscopy;  Laterality: N/A;  ? HERNIA REPAIR Right 2003  ? inguinal  ? INCISION AND DRAINAGE HIP Right 05/11/2020  ? Procedure: IRRIGATION AND DEBRIDEMENT HIP;  Surgeon: Hessie Knows, MD;  Location: [facility] ORS;  Service: Orthopedics;  Laterality: Right;  ? OVARIAN CYST REMOVAL  2001  ? right  ?  TEMPOROMANDIBULAR JOINT SURGERY  2000  ? TOTAL HIP ARTHROPLASTY Right 12/28/2019  ? Procedure: TOTAL HIP ARTHROPLASTY ANTERIOR APPROACH;  Surgeon: Hessie Knows, MD;  Location: [facility] ORS;  Service: Orthopedics;  Laterality: Right;  ? TUBAL LIGATION    ? Bilateral  ? ? ?Family History  ?Problem Relation Age of Onset  ? Hyperlipidemia Mother   ? Anxiety disorder Mother   ?  Atrial fibrillation Mother   ? Hypertension Mother   ? Memory loss Mother   ? Anxiety disorder Father   ? Stroke Father   ? Hyperlipidemia Father   ? Hypertension Father   ? Congestive Heart Failure Father   ? Depression Father   ? Hyperlipidemia Brother   ? Colon cancer Paternal Aunt   ? Dementia Paternal Aunt   ? Dementia Maternal Grandmother   ? Dementia Paternal Grandmother   ? ADD / ADHD Daughter   ? Breast cancer Neg Hx   ? ? ?Social History  ? ?Socioeconomic History  ? Marital status: Married  ?  Spouse name: Not on file  ? Number of children: 3  ? Years of education: 55  ? Highest education level: Some college, no degree  ?Occupational History  ? Occupation: Disability  ?Tobacco Use  ? Smoking status: Never  ? Smokeless tobacco: Never  ?Vaping Use  ? Vaping Use: Never used  ?Substance and Sexual Activity  ? Alcohol use: Not Currently  ?  Alcohol/week: 0.0 standard drinks  ? Drug use: No  ? Sexual activity: Yes  ?  Birth control/protection: Surgical  ?  Comment: Hysterectomy  ?Other Topics Concern  ? Not on file  ?Social History Narrative  ? Patient drinks 2-3 cups of caffeine daily.  ? Patient is right handed.   ?   ? Social Hx:  ? Current living situation- [place]. Living with husband and 3 kids  ? Born and Raised- [place]raised by both parents.   ? Siblings- 1 brother who is 7 yrs older than her  ? Legal issues- none  ? Married for 24 yrs. Currently unemployed since 2011 when she hurt her back. HS grad and CNA and phlebotomy.   ? ?Social Determinants of Health  ? ?Financial Resource Strain: Low Risk   ? Difficulty of Paying Living Expenses: Not hard at all  ?Food Insecurity: No Food Insecurity  ? Worried About Charity fundraiser in the Last Year: Never true  ? Ran Out of Food in the Last Year: Never true  ?Transportation Needs: No Transportation Needs  ? Lack of Transportation (Medical): No  ? Lack of Transportation (Non-Medical): No  ?Physical Activity: Insufficiently Active  ? Days of Exercise per Week: 7  days  ? Minutes of Exercise per Session: 10 min  ?Stress: No Stress Concern Present  ? Feeling of Stress : Not at all  ?Social Connections: Socially Integrated  ? Frequency of Communication with Friends and Family: More than three times a week  ? Frequency of Social Gatherings with Friends and Family: More than three times a week  ? Attends Religious Services: More than 4 times per year  ? Active Member of Clubs or Organizations: Not on file  ? Attends Archivist Meetings: More than 4 times per year  ? Marital Status: Married  ?Intimate Partner Violence: Not At Risk  ? Fear of Current or Ex-Partner: No  ? Emotionally Abused: No  ? Physically Abused: No  ? Sexually Abused: No  ? ? ?Outpatient Medications Prior to Visit  ?  Medication Sig Dispense Refill  ? AIMOVIG 140 MG/ML SOAJ Inject 140 mg into the skin every 28 (twenty-eight) days.    ? atorvastatin (LIPITOR) 10 MG tablet Take 1 tablet (10 mg total) by mouth daily. 90 tablet 3  ? azelastine (ASTELIN) 0.1 % nasal spray Place 1 spray into both nostrils 2 (two) times daily. Use in each nostril as directed 30 mL 1  ? clotrimazole-betamethasone (LOTRISONE) cream Apply externally BID for 2 wks 15 g 0  ? cyanocobalamin (,VITAMIN B-12,) 1000 MCG/ML injection INJECT 1 ML INTRAMUSCULARLY ONCE EVERY MONTH 10 mL 0  ? diclofenac sodium (VOLTAREN) 1 % GEL Apply 1 application topically 4 (four) times daily as needed (pain.).     ? erythromycin with ethanol (THERAMYCIN) 2 % external solution Apply topically 2 (two) times daily. As needed to affected areas of skin (pustules and bumps) 60 mL 3  ? esomeprazole (NEXIUM) 40 MG capsule Take 1 capsule by mouth once daily 90 capsule 3  ? lactase (RA DAIRY AID) 3000 units tablet Take 9,000 Units by mouth 3 (three) times daily as needed (with dairy consumption).     ? methocarbamol (ROBAXIN) 500 MG tablet Take 500 mg by mouth 4 (four) times daily.    ? methylphenidate (RITALIN) 20 MG tablet Take 1 tablet (20 mg total) by mouth  daily with breakfast. 30 tablet 0  ? methylPREDNISolone (MEDROL DOSEPAK) 4 MG TBPK tablet Medrol dosepack 6 day taper.  Take as directed. 21 tablet 0  ? mupirocin ointment (BACTROBAN) 2 % Apply to healing wound

## 2022-03-06 ENCOUNTER — Ambulatory Visit (INDEPENDENT_AMBULATORY_CARE_PROVIDER_SITE_OTHER): Payer: Medicare HMO | Admitting: Internal Medicine

## 2022-03-06 ENCOUNTER — Telehealth: Payer: Self-pay | Admitting: Obstetrics and Gynecology

## 2022-03-06 ENCOUNTER — Encounter: Payer: Self-pay | Admitting: Internal Medicine

## 2022-03-06 DIAGNOSIS — M545 Low back pain, unspecified: Secondary | ICD-10-CM | POA: Diagnosis not present

## 2022-03-06 DIAGNOSIS — G935 Compression of brain: Secondary | ICD-10-CM

## 2022-03-06 DIAGNOSIS — D072 Carcinoma in situ of vagina: Secondary | ICD-10-CM

## 2022-03-06 DIAGNOSIS — F411 Generalized anxiety disorder: Secondary | ICD-10-CM

## 2022-03-06 DIAGNOSIS — G4733 Obstructive sleep apnea (adult) (pediatric): Secondary | ICD-10-CM | POA: Diagnosis not present

## 2022-03-06 DIAGNOSIS — D649 Anemia, unspecified: Secondary | ICD-10-CM

## 2022-03-06 DIAGNOSIS — R051 Acute cough: Secondary | ICD-10-CM

## 2022-03-06 DIAGNOSIS — R739 Hyperglycemia, unspecified: Secondary | ICD-10-CM | POA: Diagnosis not present

## 2022-03-06 DIAGNOSIS — N891 Moderate vaginal dysplasia: Secondary | ICD-10-CM

## 2022-03-06 DIAGNOSIS — K219 Gastro-esophageal reflux disease without esophagitis: Secondary | ICD-10-CM

## 2022-03-06 DIAGNOSIS — G43809 Other migraine, not intractable, without status migrainosus: Secondary | ICD-10-CM

## 2022-03-06 DIAGNOSIS — E7800 Pure hypercholesterolemia, unspecified: Secondary | ICD-10-CM | POA: Diagnosis not present

## 2022-03-06 DIAGNOSIS — F325 Major depressive disorder, single episode, in full remission: Secondary | ICD-10-CM

## 2022-03-06 DIAGNOSIS — G8929 Other chronic pain: Secondary | ICD-10-CM

## 2022-03-06 DIAGNOSIS — N898 Other specified noninflammatory disorders of vagina: Secondary | ICD-10-CM

## 2022-03-06 LAB — BASIC METABOLIC PANEL
BUN: 12 mg/dL (ref 6–23)
CO2: 30 mEq/L (ref 19–32)
Calcium: 8.9 mg/dL (ref 8.4–10.5)
Chloride: 106 mEq/L (ref 96–112)
Creatinine, Ser: 0.69 mg/dL (ref 0.40–1.20)
GFR: 102.43 mL/min (ref >60.00)
Glucose, Bld: 83 mg/dL (ref 70–99)
Potassium: 4 mEq/L (ref 3.5–5.1)
Sodium: 141 mEq/L (ref 135–145)

## 2022-03-06 LAB — LIPID PANEL
Cholesterol: 248 mg/dL — ABNORMAL HIGH (ref 0–200)
HDL: 45 mg/dL
LDL Cholesterol: 169 mg/dL — ABNORMAL HIGH (ref 0–99)
NonHDL: 202.69
Total CHOL/HDL Ratio: 6
Triglycerides: 169 mg/dL — ABNORMAL HIGH (ref 0.0–149.0)
VLDL: 33.8 mg/dL (ref 0.0–40.0)

## 2022-03-06 LAB — HEPATIC FUNCTION PANEL
ALT: 31 U/L (ref 0–35)
AST: 24 U/L (ref 0–37)
Albumin: 4.1 g/dL (ref 3.5–5.2)
Alkaline Phosphatase: 62 U/L (ref 39–117)
Bilirubin, Direct: 0.1 mg/dL (ref 0.0–0.3)
Total Bilirubin: 0.4 mg/dL (ref 0.2–1.2)
Total Protein: 6.2 g/dL (ref 6.0–8.3)

## 2022-03-06 LAB — HEMOGLOBIN A1C: Hgb A1c MFr Bld: 5.6 % (ref 4.6–6.5)

## 2022-03-06 MED ORDER — DOXYCYCLINE HYCLATE 100 MG PO TABS: 100.0000 mg | ORAL_TABLET | Freq: Two times a day (BID) | ORAL | 0 refills | Status: DC

## 2022-03-06 MED ORDER — PREDNISONE 10 MG PO TABS: ORAL_TABLET | ORAL | 0 refills | Status: DC

## 2022-03-06 MED ORDER — FLUCONAZOLE 150 MG PO TABS: ORAL_TABLET | ORAL | 0 refills | Status: DC

## 2022-03-06 NOTE — Progress Notes (Signed)
Patient ID: Kathleen Everett, female   DOB: November 24, 1970, 51 y.o.   MRN: [id number] ? ? ?Subjective:  ? ? Patient ID: Kathleen Everett, female    DOB: 1971-06-03, 51 y.o.   MRN: [id number] ? ?This visit occurred during the SARS-CoV-2 public health emergency.  Safety protocols were in place, including screening questions prior to the visit, additional usage of staff PPE, and extensive cleaning of exam room while observing appropriate contact time as indicated for disinfecting solutions.  ? ?Patient here for a scheduled follow up.  ? ?Chief Complaint  ?Patient presents with  ? Follow-up  ?  Follow up - pt states still having sx from Feb. Sinus pressure, crusty eyes, yellow/green mucous, cough, chest congestion.  ? .  ? ?HPI ?Had cough and congestion in February.  Treated with doxycycline and prednisone.  Symptoms improved.  Two weeks ago, developed increased cough.  Also increased sinus pressure and nasal congestion.  Left ear popping - full.  Sore throat.  Increased cough.  No chest pain or sob reported.  No abdominal pain.  Bowels moving.  Due to see her therapist tomorrow.   ? ? ?Past Medical History:  ?Diagnosis Date  ? Abdominal pain 07/24/2014  ? Abscess of right hip 05/11/2020  ? Anemia 01/02/2020  ? Ankle pain, right 04/24/2014  ? Breast pain, right 09/08/2014  ? Chest pain 02/15/2015  ? 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 s/p sgy decompression  ? Chronic back pain 12/13/2012  ? Chronic gastritis 07/18/2015  ? Chronic tension-type headache, intractable 04/24/2015  ? DDD (degenerative disc disease), lumbar   ? Diverticulitis   ? Dysmenorrhea   ? Dysplastic nevus 10/09/2015  ? R abdomen - moderate, limited margins free.  ? Endometriosis   ? Generalized anxiety disorder 05/01/2015  ? Has previously seen psychiatry. Having some issues with what she feels is related to stress and anxiety  ? GERD (gastroesophageal reflux disease)   ? Hemorrhoids 12/23/2020  ? Hidradenitis   ? History of ovarian cyst   ? Hot flashes  03/12/2015  ? Hypercholesteremia 01/10/2014  ? Off lipitor. Elevated cholesterol. Labs discussed. Restart lipitor. Follow lipid panel and liver function tests.  ? Hyperglycemia 06/24/2019  ? Major depressive disorder 05/01/2015  ? Migraine headaches 01/10/2014  ? Has seen neurology. Topamax.  ? Mitral valve prolapse   ? Obesity   ? Obstructive sleep apnea 01/10/2014  ? nightly CPAP use  ? PONV (postoperative nausea and vomiting)   ? Primary osteoarthritis of right hip 01/17/2016  ? Right hip pain 01/10/2014  ? Soft tissue mass 03/03/2021  ? 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 TMJ (dislocation of temporomandibular joint)   ? Vitamin B12 deficiency   ? On injections  ? Vitamin D deficiency 01/18/2014  ? Overview:  Level is 21.6  Formatting of this note might be different from the original. Level is 21.6  ? ?Past Surgical History:  ?Procedure Laterality Date  ? ABDOMINAL HYSTERECTOMY  03/27/07  ? laparoscopic  ? BREAST EXCISIONAL BIOPSY Left 2004  ? fibroadenoma  ? CESAREAN SECTION    ? 1999/2004 twins  ? chiari decompression    ? COLONOSCOPY WITH PROPOFOL N/A 04/28/2015  ? Procedure: COLONOSCOPY WITH PROPOFOL;  Surgeon: Manya Silvas, MD;  Location: [facility] ENDOSCOPY;  Service: Endoscopy;  Laterality: N/A;  ? COLONOSCOPY WITH PROPOFOL N/A 03/19/2021  ? Procedure: COLONOSCOPY WITH PROPOFOL;  Surgeon: Lesly Rubenstein, MD;  Location: [facility] ENDOSCOPY;  Service: Endoscopy;  Laterality:  N/A;  ? CYSTOSCOPY    ? bladder ulcers  ? ESOPHAGOGASTRODUODENOSCOPY N/A 04/28/2015  ? Procedure: ESOPHAGOGASTRODUODENOSCOPY (EGD);  Surgeon: Manya Silvas, MD;  Location: [facility] ENDOSCOPY;  Service: Endoscopy;  Laterality: N/A;  ? ESOPHAGOGASTRODUODENOSCOPY (EGD) WITH PROPOFOL N/A 03/19/2021  ? Procedure: ESOPHAGOGASTRODUODENOSCOPY (EGD) WITH PROPOFOL;  Surgeon: Lesly Rubenstein, MD;  Location: [facility] ENDOSCOPY;  Service: Endoscopy;  Laterality: N/A;  ? HERNIA REPAIR Right 2003  ? inguinal  ? INCISION AND DRAINAGE HIP Right 05/11/2020   ? Procedure: IRRIGATION AND DEBRIDEMENT HIP;  Surgeon: Hessie Knows, MD;  Location: [facility] ORS;  Service: Orthopedics;  Laterality: Right;  ? OVARIAN CYST REMOVAL  2001  ? right  ? TEMPOROMANDIBULAR JOINT SURGERY  2000  ? TOTAL HIP ARTHROPLASTY Right 12/28/2019  ? Procedure: TOTAL HIP ARTHROPLASTY ANTERIOR APPROACH;  Surgeon: Hessie Knows, MD;  Location: [facility] ORS;  Service: Orthopedics;  Laterality: Right;  ? TUBAL LIGATION    ? Bilateral  ? ?Family History  ?Problem Relation Age of Onset  ? Hyperlipidemia Mother   ? Anxiety disorder Mother   ? Atrial fibrillation Mother   ? Hypertension Mother   ? Memory loss Mother   ? Anxiety disorder Father   ? Stroke Father   ? Hyperlipidemia Father   ? Hypertension Father   ? Congestive Heart Failure Father   ? Depression Father   ? Hyperlipidemia Brother   ? Colon cancer Paternal Aunt   ? Dementia Paternal Aunt   ? Dementia Maternal Grandmother   ? Dementia Paternal Grandmother   ? ADD / ADHD Daughter   ? Breast cancer Neg Hx   ? ?Social History  ? ?Socioeconomic History  ? Marital status: Married  ?  Spouse name: Not on file  ? Number of children: 3  ? Years of education: 67  ? Highest education level: Some college, no degree  ?Occupational History  ? Occupation: Disability  ?Tobacco Use  ? Smoking status: Never  ? Smokeless tobacco: Never  ?Vaping Use  ? Vaping Use: Never used  ?Substance and Sexual Activity  ? Alcohol use: Not Currently  ?  Alcohol/week: 0.0 standard drinks  ? Drug use: No  ? Sexual activity: Yes  ?  Birth control/protection: Surgical  ?  Comment: Hysterectomy  ?Other Topics Concern  ? Not on file  ?Social History Narrative  ? Patient drinks 2-3 cups of caffeine daily.  ? Patient is right handed.   ?   ? Social Hx:  ? Current living situation- [place]. Living with husband and 3 kids  ? Born and Raised- [place].raised by both parents.   ? Siblings- 1 brother who is 7 yrs older than her  ? Legal issues- none  ? Married for 24 yrs. Currently unemployed since 2011  when she hurt her back. HS grad and CNA and phlebotomy.   ? ?Social Determinants of Health  ? ?Financial Resource Strain: Low Risk   ? Difficulty of Paying Living Expenses: Not hard at all  ?Food Insecurity: No Food Insecurity  ? Worried About Charity fundraiser in the Last Year: Never true  ? Ran Out of Food in the Last Year: Never true  ?Transportation Needs: No Transportation Needs  ? Lack of Transportation (Medical): No  ? Lack of Transportation (Non-Medical): No  ?Physical Activity: Insufficiently Active  ? Days of Exercise per Week: 7 days  ? Minutes of Exercise per Session: 10 min  ?Stress: No Stress Concern Present  ? Feeling of Stress : Not at  all  ?Social Connections: Socially Integrated  ? Frequency of Communication with Friends and Family: More than three times a week  ? Frequency of Social Gatherings with Friends and Family: More than three times a week  ? Attends Religious Services: More than 4 times per year  ? Active Member of Clubs or Organizations: Not on file  ? Attends Archivist Meetings: More than 4 times per year  ? Marital Status: Married  ? ? ? ?Review of Systems  ?Constitutional:  Negative for appetite change and unexpected weight change.  ?HENT:  Positive for congestion, postnasal drip and sinus pressure.   ?Respiratory:  Positive for cough. Negative for chest tightness and shortness of breath.   ?Cardiovascular:  Negative for chest pain, palpitations and leg swelling.  ?Gastrointestinal:  Negative for abdominal pain, diarrhea, nausea and vomiting.  ?Genitourinary:  Negative for difficulty urinating and dysuria.  ?Musculoskeletal:  Negative for joint swelling and myalgias.  ?Skin:  Negative for color change and rash.  ?Neurological:  Negative for dizziness and light-headedness.  ?Psychiatric/Behavioral:  Negative for agitation and dysphoric mood.   ? ?   ?Objective:  ?  ? ?BP 130/86 (BP Location: Left Arm, Patient Position: Sitting, Cuff Size: Small)   Pulse 75   Temp 98.4 ?F  (36.9 ?C) (Temporal)   Resp 14   Ht '5\' 10"'$  (1.778 m)   Wt 228 lb 1 oz (103.4 kg)   LMP 12/08/2006   SpO2 98%   BMI 32.72 kg/m?  ?Wt Readings from Last 3 Encounters:  ?03/06/22 228 lb 1 oz (103.4 kg)  ?04/17

## 2022-03-06 NOTE — Patient Instructions (Signed)
Avoid increased sunlight.  ?

## 2022-03-06 NOTE — Telephone Encounter (Signed)
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  ?

## 2022-03-07 ENCOUNTER — Telehealth (HOSPITAL_BASED_OUTPATIENT_CLINIC_OR_DEPARTMENT_OTHER): Payer: Medicare HMO | Admitting: Psychiatry

## 2022-03-07 ENCOUNTER — Telehealth: Payer: Self-pay | Admitting: Primary Care

## 2022-03-07 DIAGNOSIS — F411 Generalized anxiety disorder: Secondary | ICD-10-CM

## 2022-03-07 DIAGNOSIS — F331 Major depressive disorder, recurrent, moderate: Secondary | ICD-10-CM | POA: Diagnosis not present

## 2022-03-07 MED ORDER — SERTRALINE HCL 100 MG PO TABS: 200.0000 mg | ORAL_TABLET | Freq: Every day | ORAL | 0 refills | Status: DC

## 2022-03-07 NOTE — Telephone Encounter (Signed)
I have spoke with Kathleen Everett and we have rescheduled for her to pick up the HST machine on 03/15/22  @ 2:00pm  ?

## 2022-03-07 NOTE — Progress Notes (Signed)
Virtual Visit via Video Note ? ?I connected with Kathleen Everett on 03/07/22 at  9:00 AM EDT by a video enabled telemedicine application and verified that I am speaking with the correct person using two identifiers. ? ?Location: ?Patient: home ?Provider: office ?  ?I discussed the limitations of evaluation and management by telemedicine and the availability of in person appointments. The patient expressed understanding and agreed to proceed. ? ?History of Present Illness: ?Kathleen Everett is dealing with another sinus infection and is on her 3rd round of antibiotics and Prednisone. She does not feel well physically. Her energy and sleep are poor. Her appetite is decreased but she is forcing herself to eat. Kathleen Everett is applying for disability. She was sent form to fill out and only given a few days to do it. It was very stressful to do that and deal with all her health problems. 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 Her depression has been "awful". At night her mind races and she is unable to relax. Focusing on her dog makes her feel better too. She tired a few other exercises like deep breathing and guided imagery but it didn't help. She has ongoing negative self talk. [name] denies passive thoughts of death and SI/HI.  ? ?About 3 weeks ago her stomach was feeling sick after taking the Ritalin. She stopped it for a few days and the pain resolved. She restarted it by taking 1/2 tab twice/day. Ritalin helps a little with concentration but not much.  ?  ?Observations/Objective: ?Psychiatric Specialty Exam: ?ROS  ?Last menstrual period 12/08/2006.There is no height or weight on file to calculate BMI.  ?General Appearance: Casual  ?Eye Contact:  Good  ?Speech:  Clear and Coherent and Normal Rate  ?Volume:  Normal  ?Mood:  Anxious and Depressed  ?Affect:  Congruent  ?Thought Process:  Goal Directed, Linear, and Descriptions of Associations: Intact  ?Orientation:  Full (Time, Place, and Person)   ?Thought Content:  Logical  ?Suicidal Thoughts:  No  ?Homicidal Thoughts:  No  ?Memory:  Immediate;   Good  ?Judgement:  Good  ?Insight:  Good  ?Psychomotor Activity:  Normal  ?Concentration:  Concentration: Good  ?Recall:  Good  ?Fund of Knowledge:  Good  ?Language:  Good  ?Akathisia:  No  ?Handed:  Right  ?AIMS (if indicated):     ?Assets:  Communication Skills ?Desire for Improvement ?Financial Resources/Insurance ?Housing ?Resilience ?Social Support ?Talents/Skills ?Transportation ?Vocational/Educational  ?ADL's:  Intact  ?Cognition:  WNL  ?Sleep:     ? ? ? ?Assessment and Plan: ? ? ?  03/07/2022  ?  9:11 AM 01/03/2022  ?  1:28 PM 09/20/2021  ?  9:13 AM 08/23/2021  ?  1:17 PM 06/28/2021  ?  1:12 PM  ?Depression screen PHQ 2/9  ?Decreased Interest '3  3 3 3  '$ ?Down, Depressed, Hopeless '3 3 2 3 3  '$ ?PHQ - 2 Score '6 3 5 6 6  '$ ?Altered sleeping '3 3 3 3 3  '$ ?Tired, decreased energy '3 3 3 3 3  '$ ?Change in appetite '3 3 3 2 1  '$ ?Feeling bad or failure about yourself  '3 3 3 3 3  '$ ?Trouble concentrating '2 2 2 3 3  '$ ?Moving slowly or fidgety/restless 0 0 0 0 0  ?Suicidal thoughts 0 0 0 0 0  ?PHQ-9 Score '20 17 19 20 19  '$ ?Difficult doing work/chores Extremely dIfficult Very difficult Very difficult Very difficult Very difficult  ? ? ?Flowsheet  Row Video Visit from 03/07/2022 in [facility] Video Visit from 01/03/2022 in [facility] Video Visit from 09/20/2021 in [facility]  ?C-SSRS RISK CATEGORY No Risk No Risk Error: Q3, 4, or 5 should not be populated when Q2 is No  ? ?  ? ? ? ?Status of current problems: ongoing ? ?Meds:  ?D/c Ritalin for now and will do another trial after she has finished antibiotics and Prednisone. Prior to starting antibiotics she was tolerating Ritalin well without GI side effects.  ? ?1. MDD (major depressive disorder), recurrent episode, moderate (HCC) ?- sertraline (ZOLOFT) 100 MG tablet; Take  2 tablets (200 mg total) by mouth daily.  Dispense: 180 tablet; Refill: 0 ? ?2. Generalized anxiety disorder ?- sertraline (ZOLOFT) 100 MG tablet; Take 2 tablets (200 mg total) by mouth daily.  Dispense: 180 tablet; Refill: 0 ? ? ? ? ? ? ?Therapy: brief supportive therapy provided. Discussed psychosocial stressors in detail.   ? ?Recommended she try progressive muscle relaxation ? ? ?Collaboration of Care: Other none today ? ?Patient/Guardian was advised Release of Information must be obtained prior to any record release in order to collaborate their care with an outside provider. Patient/Guardian was advised if they have not already done so to contact the registration department to sign all necessary forms in order for [place] to release information regarding their care.  ? ?Consent: Patient/Guardian gives verbal consent for treatment and assignment of benefits for services provided during this visit. Patient/Guardian expressed understanding and agreed to proceed.  ? ? ? ? ?Follow Up Instructions: ?Follow up in 1-2 months or sooner if needed ?  ? ?I discussed the assessment and treatment plan with the patient. The patient was provided an opportunity to ask questions and all were answered. The patient agreed with the plan and demonstrated an understanding of the instructions. ?  ?The patient was advised to call back or seek an in-person evaluation if the symptoms worsen or if the condition fails to improve as anticipated. ? ?I provided 18 minutes of non-face-to-face time during this encounter. ? ? ?Charlcie Cradle, MD ? ?

## 2022-03-08 ENCOUNTER — Telehealth: Payer: Self-pay | Admitting: *Deleted

## 2022-03-08 NOTE — Telephone Encounter (Signed)
Spoke with the patient to scheduled a new patient appt, patient request an appt at [facility] with [place]. Referral updated to show that request. This message routed to referring office  ?

## 2022-03-08 NOTE — Telephone Encounter (Signed)
Pls follow this ref. 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

## 2022-03-10 ENCOUNTER — Encounter: Payer: Self-pay | Admitting: Internal Medicine

## 2022-03-10 NOTE — Assessment & Plan Note (Signed)
Follow cbc.  

## 2022-03-10 NOTE — Assessment & Plan Note (Signed)
No upper symptoms reported.  Nexium.  

## 2022-03-10 NOTE — Assessment & Plan Note (Signed)
Seeing psychiatry.  Overall appears to be doing well on current medication regimen.   ?

## 2022-03-10 NOTE — Assessment & Plan Note (Signed)
Followed by neurology.  Intermittent headaches.  Needs f/u with neurology.  ?

## 2022-03-10 NOTE — Assessment & Plan Note (Signed)
Increased cough and congestion - sinus pressure as outlined.  Saline nasal spray and steroid nasal spray.  Doxycycline and prednisone taper as directed.  mucinex as directed.  Follow.  ?

## 2022-03-10 NOTE — Assessment & Plan Note (Signed)
On lipitor.  Low cholesterol diet and exercise.  Follow lipid panel and liver function tests.   

## 2022-03-10 NOTE — Assessment & Plan Note (Signed)
Seeing psychiatry.  Appears to be stable.  ?

## 2022-03-10 NOTE — Assessment & Plan Note (Signed)
Low carb diet and exercise.  Follow met b and a1c.  ?

## 2022-03-10 NOTE — Assessment & Plan Note (Signed)
S/p surgery.  Followed by neurology.  

## 2022-03-10 NOTE — Assessment & Plan Note (Signed)
Followed by pain clinic.  Has f/u next week.  ?

## 2022-03-10 NOTE — Assessment & Plan Note (Signed)
Saw pulmonary.  HST.  ?

## 2022-03-10 NOTE — Assessment & Plan Note (Signed)
Saw gyn. S/p vulvar biopsy.  Continue f/u with gyn.  ?

## 2022-03-11 ENCOUNTER — Telehealth: Payer: Self-pay

## 2022-03-11 NOTE — Telephone Encounter (Signed)
Received referral for gyn oncology. Called and left voicemail with Ms. Redmon to return call for scheduling. ?

## 2022-03-12 ENCOUNTER — Other Ambulatory Visit: Payer: Self-pay

## 2022-03-12 MED ORDER — ATORVASTATIN CALCIUM 20 MG PO TABS
20.0000 mg | ORAL_TABLET | Freq: Every day | ORAL | 3 refills | Status: DC
Start: 2022-03-12 — End: 2022-04-24

## 2022-03-13 NOTE — Telephone Encounter (Signed)
Pt has gyn onc appt on 03/20/22 ?

## 2022-03-15 ENCOUNTER — Ambulatory Visit: Payer: Medicare HMO

## 2022-03-15 DIAGNOSIS — G4733 Obstructive sleep apnea (adult) (pediatric): Secondary | ICD-10-CM | POA: Diagnosis not present

## 2022-03-19 DIAGNOSIS — G4733 Obstructive sleep apnea (adult) (pediatric): Secondary | ICD-10-CM | POA: Diagnosis not present

## 2022-03-20 ENCOUNTER — Inpatient Hospital Stay: Payer: Medicare HMO | Attending: Obstetrics and Gynecology | Admitting: Obstetrics and Gynecology

## 2022-03-20 VITALS — BP 149/91 | HR 74 | Temp 98.7°F | Resp 20 | Ht 70.0 in | Wt 229.5 lb

## 2022-03-20 DIAGNOSIS — N903 Dysplasia of vulva, unspecified: Secondary | ICD-10-CM | POA: Diagnosis not present

## 2022-03-20 MED ORDER — LIDOCAINE HCL URETHRAL/MUCOSAL 2 % EX PRSY: 1.0000 "application " | PREFILLED_SYRINGE | CUTANEOUS | 0 refills | Status: DC | PRN

## 2022-03-20 NOTE — Progress Notes (Signed)
Gynecologic Oncology Consult Visit  ? ?Referring Provider: ?Ardeth Perfect, PA ? 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 ?Subjective:  ?Kathleen Everett is a 51 y.o. 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 ? ?She presented with pain and slow healing hole in LT labia minora; sx off and on for over a year. Has had neg yeast and BV cultures, neg HSV 2 testing. Pt has tried nystatin crm, estrace crm, coconut oil, and vaseline without full relief. She couldn't tolerate lotrisone cream.  ? ?03/04/2022 On exam noted to have anterior vulvar lesion that was biopsied. 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  ? ?Non-smoker, No abnormal Pap - last Pap 06/24/2019 NIML satisfactory with transformation zone. She doesn't recall a h/o abnormal Pap but thinks she had a LEEP in 2011.  ? ?She was referred for evaluation.  ? ? ?Problem List: ?Patient Active Problem List  ? Diagnosis Date Noted  ? 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 Ear fullness 02/09/2022  ? Runny nose 09/30/2021  ? Dizziness 07/22/2021  ? Vitamin B12 deficiency   ? Vaginal irritation 03/03/2021  ? Soft tissue mass 03/03/2021  ? Hemorrhoids 12/23/2020  ? DDD (degenerative disc disease), lumbar 10/03/2020  ? Abscess of right hip 05/11/2020  ? Healthcare maintenance 01/02/2020  ? Anemia 01/02/2020  ? 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 Hyperglycemia 06/24/2019  ? Vaginal bleeding 09/30/2016  ? Primary osteoarthritis of right hip 01/17/2016  ? Chronic gastritis 07/18/2015  ? Depression, major, in remission ([place]) 05/01/2015  ? Generalized anxiety disorder 05/01/2015  ? Chronic tension-type headache, intractable 04/24/2015  ? Dysmenorrhea 04/24/2015  ? Endometriosis 04/24/2015  ? Hidradenitis 04/24/2015  ? Hot flashes 03/12/2015  ? Chest pain 02/15/2015  ? BMI 35.0-35.9,adult 10/16/2014  ? Cough 09/28/2014  ? Breast pain, right 09/08/2014  ?  Abdominal pain 07/24/2014  ? GERD (gastroesophageal reflux disease) 04/24/2014  ? Ankle pain, right 04/24/2014  ? 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 Vitamin D deficiency 01/18/2014  ? Diverticulitis 01/10/2014  ? Hypercholesteremia 01/10/2014  ? Right hip pain 01/10/2014  ? Migraine headaches 01/10/2014  ? Obstructive sleep apnea 01/10/2014  ? Chronic back pain 12/13/2012  ? Mitral valve prolapse 12/13/2012  ? ? ?Past Medical History: ?Past Medical History:  ?Diagnosis Date  ? Abdominal pain 07/24/2014  ? Abscess of right hip 05/11/2020  ? Anemia 01/02/2020  ? Ankle pain, right 04/24/2014  ? Breast pain, right 09/08/2014  ? Chest pain 02/15/2015  ? 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 s/p sgy decompression  ? Chronic back pain 12/13/2012  ? Chronic gastritis 07/18/2015  ? Chronic tension-type headache, intractable 04/24/2015  ? DDD (degenerative disc disease), lumbar   ? Diverticulitis   ? Dysmenorrhea   ? Dysplastic nevus 10/09/2015  ? R abdomen - moderate, limited margins free.  ? Endometriosis   ? Generalized anxiety disorder 05/01/2015  ? Has previously seen psychiatry. Having some issues with what she feels is related to stress and anxiety  ? GERD (gastroesophageal reflux disease)   ? Hemorrhoids 12/23/2020  ? Hidradenitis   ? History of ovarian cyst   ? Hot flashes 03/12/2015  ? Hypercholesteremia 01/10/2014  ? Off lipitor. Elevated cholesterol. Labs discussed. Restart lipitor. Follow lipid panel and liver function tests.  ? Hyperglycemia 06/24/2019  ? Major depressive disorder 05/01/2015  ? Migraine headaches 01/10/2014  ? Has seen neurology. Topamax.  ?  Mitral valve prolapse   ? Obesity   ? Obstructive sleep apnea 01/10/2014  ? nightly CPAP use  ? PONV (postoperative nausea and vomiting)   ? Primary osteoarthritis of right hip 01/17/2016  ? Right hip pain 01/10/2014  ? Soft tissue mass 03/03/2021  ? 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 TMJ (dislocation of temporomandibular joint)   ? Vitamin B12  deficiency   ? On injections  ? Vitamin D deficiency 01/18/2014  ? Overview:  Level is 21.6  Formatting of this note might be different from the original. Level is 21.6  ? ? ?Past Surgical History: ?Past Surgical History:  ?Procedure Laterality Date  ? ABDOMINAL HYSTERECTOMY  03/27/07  ? laparoscopic  ? BREAST EXCISIONAL BIOPSY Left 2004  ? fibroadenoma  ? CESAREAN SECTION    ? 1999/2004 twins  ? chiari decompression    ? COLONOSCOPY WITH PROPOFOL N/A 04/28/2015  ? Procedure: COLONOSCOPY WITH PROPOFOL;  Surgeon: Manya Silvas, MD;  Location: [facility] ENDOSCOPY;  Service: Endoscopy;  Laterality: N/A;  ? COLONOSCOPY WITH PROPOFOL N/A 03/19/2021  ? Procedure: COLONOSCOPY WITH PROPOFOL;  Surgeon: Lesly Rubenstein, MD;  Location: [facility] ENDOSCOPY;  Service: Endoscopy;  Laterality: N/A;  ? CYSTOSCOPY    ? bladder ulcers  ? ESOPHAGOGASTRODUODENOSCOPY N/A 04/28/2015  ? Procedure: ESOPHAGOGASTRODUODENOSCOPY (EGD);  Surgeon: Manya Silvas, MD;  Location: [facility] ENDOSCOPY;  Service: Endoscopy;  Laterality: N/A;  ? ESOPHAGOGASTRODUODENOSCOPY (EGD) WITH PROPOFOL N/A 03/19/2021  ? Procedure: ESOPHAGOGASTRODUODENOSCOPY (EGD) WITH PROPOFOL;  Surgeon: Lesly Rubenstein, MD;  Location: [facility] ENDOSCOPY;  Service: Endoscopy;  Laterality: N/A;  ? HERNIA REPAIR Right 2003  ? inguinal  ? INCISION AND DRAINAGE HIP Right 05/11/2020  ? Procedure: IRRIGATION AND DEBRIDEMENT HIP;  Surgeon: Hessie Knows, MD;  Location: [facility] ORS;  Service: Orthopedics;  Laterality: Right;  ? OVARIAN CYST REMOVAL  2001  ? right  ? TEMPOROMANDIBULAR JOINT SURGERY  2000  ? TOTAL HIP ARTHROPLASTY Right 12/28/2019  ? Procedure: TOTAL HIP ARTHROPLASTY ANTERIOR APPROACH;  Surgeon: Hessie Knows, MD;  Location: [facility] ORS;  Service: Orthopedics;  Laterality: Right;  ? TUBAL LIGATION    ? Bilateral  ? ? ?Past Gynecologic History:  ?Menarche: 14 ?Menstrual details: n/a ?History of Abnormal pap: as per HPI ?Last pap: 06/24/2019 NIML satisfactory with transformation zone.  ?Sexually  active: yes ? ?OB History:  ?OB History  ?Gravida Para Term Preterm AB Living  ?'2 2 2     3  '$ ?SAB IAB Ectopic Multiple Live Births  ?      1 3  ?  ?# Outcome Date GA Lbr Len/2nd Weight Sex Delivery Anes PTL Lv  ?2 Term           ?1 Term           ? ? ?Family History: ?Family History  ?Problem Relation Age of Onset  ? Hyperlipidemia Mother   ? Anxiety disorder Mother   ? Atrial fibrillation Mother   ? Hypertension Mother   ? Memory loss Mother   ? Anxiety disorder Father   ? Stroke Father   ? Hyperlipidemia Father   ? Hypertension Father   ? Congestive Heart Failure Father   ? Depression Father   ? Hyperlipidemia Brother   ? Colon cancer Paternal Aunt   ? Dementia Paternal Aunt   ? Dementia Maternal Grandmother   ? Dementia Paternal Grandmother   ? ADD / ADHD Daughter   ? Breast cancer Neg Hx   ? ? ?Social  History: ?Social History  ? ?Socioeconomic History  ? Marital status: Married  ?  Spouse name: Not on file  ? Number of children: 3  ? Years of education: 63  ? Highest education level: Some college, no degree  ?Occupational History  ? Occupation: Disability  ?Tobacco Use  ? Smoking status: Never  ? Smokeless tobacco: Never  ?Vaping Use  ? Vaping Use: Never used  ?Substance and Sexual Activity  ? Alcohol use: Not Currently  ?  Alcohol/week: 0.0 standard drinks  ? Drug use: No  ? Sexual activity: Yes  ?  Birth control/protection: Surgical  ?  Comment: Hysterectomy  ?Other Topics Concern  ? Not on file  ?Social History Narrative  ? Patient drinks 2-3 cups of caffeine daily.  ? Patient is right handed.   ?   ? Social Hx:  ? Current living situation- [place]. Living with husband and 3 kids  ? Born and Raised- [place].raised by both parents.   ? Siblings- 1 brother who is 7 yrs older than her  ? Legal issues- none  ? Married for 24 yrs. Currently unemployed since 2011 when she hurt her back. HS grad and CNA and phlebotomy.   ? ?Social Determinants of Health  ? ?Financial Resource Strain: Low Risk   ? Difficulty of Paying  Living Expenses: Not hard at all  ?Food Insecurity: No Food Insecurity  ? Worried About Charity fundraiser in the Last Year: Never true  ? Ran Out of Food in the Last Year: Never true  ?Transportation Needs:

## 2022-03-20 NOTE — Patient Instructions (Addendum)
We will schedule your surgery for 04/24/2022 We will see you back 4-6 weeks after surgery. ?

## 2022-03-20 NOTE — H&P (Addendum)
Gynecologic Oncology Consult Visit  ? ?Referring Provider: ?Ardeth Perfect, PA ? 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 ?Subjective:  ?Kathleen Everett is a 51 y.o. 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 ? ?She presented with pain and slow healing hole in LT labia minora; sx off and on for over a year. Has had neg yeast and BV cultures, neg HSV 2 testing. Pt has tried nystatin crm, estrace crm, coconut oil, and vaseline without full relief. She couldn't tolerate lotrisone cream.  ? ?03/04/2022 On exam noted to have anterior vulvar lesion that was biopsied. 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  ? ?Non-smoker, No abnormal Pap - last Pap 06/24/2019 NIML satisfactory with transformation zone. She doesn't recall a h/o abnormal Pap but thinks she had a LEEP in 2011.  ? ?She was referred for evaluation.  ? ? ?Problem List: ?Patient Active Problem List  ? Diagnosis Date Noted  ? 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 Ear fullness 02/09/2022  ? Runny nose 09/30/2021  ? Dizziness 07/22/2021  ? Vitamin B12 deficiency   ? Vaginal irritation 03/03/2021  ? Soft tissue mass 03/03/2021  ? Hemorrhoids 12/23/2020  ? DDD (degenerative disc disease), lumbar 10/03/2020  ? Abscess of right hip 05/11/2020  ? Healthcare maintenance 01/02/2020  ? Anemia 01/02/2020  ? 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 Hyperglycemia 06/24/2019  ? Vaginal bleeding 09/30/2016  ? Primary osteoarthritis of right hip 01/17/2016  ? Chronic gastritis 07/18/2015  ? Depression, major, in remission ([place]) 05/01/2015  ? Generalized anxiety disorder 05/01/2015  ? Chronic tension-type headache, intractable 04/24/2015  ? Dysmenorrhea 04/24/2015  ? Endometriosis 04/24/2015  ? Hidradenitis 04/24/2015  ? Hot flashes 03/12/2015  ? Chest pain 02/15/2015  ? BMI 35.0-35.9,adult 10/16/2014  ? Cough 09/28/2014  ? Breast pain, right 09/08/2014  ?  Abdominal pain 07/24/2014  ? GERD (gastroesophageal reflux disease) 04/24/2014  ? Ankle pain, right 04/24/2014  ? 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 Vitamin D deficiency 01/18/2014  ? Diverticulitis 01/10/2014  ? Hypercholesteremia 01/10/2014  ? Right hip pain 01/10/2014  ? Migraine headaches 01/10/2014  ? Obstructive sleep apnea 01/10/2014  ? Chronic back pain 12/13/2012  ? Mitral valve prolapse 12/13/2012  ? ? ?Past Medical History: ?Past Medical History:  ?Diagnosis Date  ? Abdominal pain 07/24/2014  ? Abscess of right hip 05/11/2020  ? Anemia 01/02/2020  ? Ankle pain, right 04/24/2014  ? Breast pain, right 09/08/2014  ? Chest pain 02/15/2015  ? 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 s/p sgy decompression  ? Chronic back pain 12/13/2012  ? Chronic gastritis 07/18/2015  ? Chronic tension-type headache, intractable 04/24/2015  ? DDD (degenerative disc disease), lumbar   ? Diverticulitis   ? Dysmenorrhea   ? Dysplastic nevus 10/09/2015  ? R abdomen - moderate, limited margins free.  ? Endometriosis   ? Generalized anxiety disorder 05/01/2015  ? Has previously seen psychiatry. Having some issues with what she feels is related to stress and anxiety  ? GERD (gastroesophageal reflux disease)   ? Hemorrhoids 12/23/2020  ? Hidradenitis   ? History of ovarian cyst   ? Hot flashes 03/12/2015  ? Hypercholesteremia 01/10/2014  ? Off lipitor. Elevated cholesterol. Labs discussed. Restart lipitor. Follow lipid panel and liver function tests.  ? Hyperglycemia 06/24/2019  ? Major depressive disorder 05/01/2015  ? Migraine headaches 01/10/2014  ? Has seen neurology. Topamax.  ?  Mitral valve prolapse   ? Obesity   ? Obstructive sleep apnea 01/10/2014  ? nightly CPAP use  ? PONV (postoperative nausea and vomiting)   ? Primary osteoarthritis of right hip 01/17/2016  ? Right hip pain 01/10/2014  ? Soft tissue mass 03/03/2021  ? 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 TMJ (dislocation of temporomandibular joint)   ? Vitamin B12  deficiency   ? On injections  ? Vitamin D deficiency 01/18/2014  ? Overview:  Level is 21.6  Formatting of this note might be different from the original. Level is 21.6  ? ? ?Past Surgical History: ?Past Surgical History:  ?Procedure Laterality Date  ? ABDOMINAL HYSTERECTOMY  03/27/07  ? laparoscopic  ? BREAST EXCISIONAL BIOPSY Left 2004  ? fibroadenoma  ? CESAREAN SECTION    ? 1999/2004 twins  ? chiari decompression    ? COLONOSCOPY WITH PROPOFOL N/A 04/28/2015  ? Procedure: COLONOSCOPY WITH PROPOFOL;  Surgeon: Manya Silvas, MD;  Location: [facility] ENDOSCOPY;  Service: Endoscopy;  Laterality: N/A;  ? COLONOSCOPY WITH PROPOFOL N/A 03/19/2021  ? Procedure: COLONOSCOPY WITH PROPOFOL;  Surgeon: Lesly Rubenstein, MD;  Location: [facility] ENDOSCOPY;  Service: Endoscopy;  Laterality: N/A;  ? CYSTOSCOPY    ? bladder ulcers  ? ESOPHAGOGASTRODUODENOSCOPY N/A 04/28/2015  ? Procedure: ESOPHAGOGASTRODUODENOSCOPY (EGD);  Surgeon: Manya Silvas, MD;  Location: [facility] ENDOSCOPY;  Service: Endoscopy;  Laterality: N/A;  ? ESOPHAGOGASTRODUODENOSCOPY (EGD) WITH PROPOFOL N/A 03/19/2021  ? Procedure: ESOPHAGOGASTRODUODENOSCOPY (EGD) WITH PROPOFOL;  Surgeon: Lesly Rubenstein, MD;  Location: [facility] ENDOSCOPY;  Service: Endoscopy;  Laterality: N/A;  ? HERNIA REPAIR Right 2003  ? inguinal  ? INCISION AND DRAINAGE HIP Right 05/11/2020  ? Procedure: IRRIGATION AND DEBRIDEMENT HIP;  Surgeon: Hessie Knows, MD;  Location: [facility] ORS;  Service: Orthopedics;  Laterality: Right;  ? OVARIAN CYST REMOVAL  2001  ? right  ? TEMPOROMANDIBULAR JOINT SURGERY  2000  ? TOTAL HIP ARTHROPLASTY Right 12/28/2019  ? Procedure: TOTAL HIP ARTHROPLASTY ANTERIOR APPROACH;  Surgeon: Hessie Knows, MD;  Location: [facility] ORS;  Service: Orthopedics;  Laterality: Right;  ? TUBAL LIGATION    ? Bilateral  ? ? ?Past Gynecologic History:  ?Menarche: 14 ?Menstrual details: n/a ?History of Abnormal pap: as per HPI ?Last pap: 06/24/2019 NIML satisfactory with transformation zone.  ?Sexually  active: yes ? ?OB History:  ?OB History  ?Gravida Para Term Preterm AB Living  ?'2 2 2     3  '$ ?SAB IAB Ectopic Multiple Live Births  ?      1 3  ?  ?# Outcome Date GA Lbr Len/2nd Weight Sex Delivery Anes PTL Lv  ?2 Term           ?1 Term           ? ? ?Family History: ?Family History  ?Problem Relation Age of Onset  ? Hyperlipidemia Mother   ? Anxiety disorder Mother   ? Atrial fibrillation Mother   ? Hypertension Mother   ? Memory loss Mother   ? Anxiety disorder Father   ? Stroke Father   ? Hyperlipidemia Father   ? Hypertension Father   ? Congestive Heart Failure Father   ? Depression Father   ? Hyperlipidemia Brother   ? Colon cancer Paternal Aunt   ? Dementia Paternal Aunt   ? Dementia Maternal Grandmother   ? Dementia Paternal Grandmother   ? ADD / ADHD Daughter   ? Breast cancer Neg Hx   ? ? ?Social  History: ?Social History  ? ?Socioeconomic History  ? Marital status: Married  ?  Spouse name: Not on file  ? Number of children: 3  ? Years of education: 72  ? Highest education level: Some college, no degree  ?Occupational History  ? Occupation: Disability  ?Tobacco Use  ? Smoking status: Never  ? Smokeless tobacco: Never  ?Vaping Use  ? Vaping Use: Never used  ?Substance and Sexual Activity  ? Alcohol use: Not Currently  ?  Alcohol/week: 0.0 standard drinks  ? Drug use: No  ? Sexual activity: Yes  ?  Birth control/protection: Surgical  ?  Comment: Hysterectomy  ?Other Topics Concern  ? Not on file  ?Social History Narrative  ? Patient drinks 2-3 cups of caffeine daily.  ? Patient is right handed.   ?   ? Social Hx:  ? Current living situation- [place]. Living with husband and 3 kids  ? Born and Raised- [place].raised by both parents.   ? Siblings- 1 brother who is 7 yrs older than her  ? Legal issues- none  ? Married for 24 yrs. Currently unemployed since 2011 when she hurt her back. HS grad and CNA and phlebotomy.   ? ?Social Determinants of Health  ? ?Financial Resource Strain: Low Risk   ? Difficulty of Paying  Living Expenses: Not hard at all  ?Food Insecurity: No Food Insecurity  ? Worried About Charity fundraiser in the Last Year: Never true  ? Ran Out of Food in the Last Year: Never true  ?Transportation Needs:

## 2022-03-25 ENCOUNTER — Ambulatory Visit: Payer: Medicare HMO | Admitting: Gynecologic Oncology

## 2022-03-27 LAB — SURGICAL PATHOLOGY

## 2022-04-02 DIAGNOSIS — M5441 Lumbago with sciatica, right side: Secondary | ICD-10-CM | POA: Diagnosis not present

## 2022-04-02 DIAGNOSIS — M5412 Radiculopathy, cervical region: Secondary | ICD-10-CM | POA: Diagnosis not present

## 2022-04-02 DIAGNOSIS — M5137 Other intervertebral disc degeneration, lumbosacral region: Secondary | ICD-10-CM | POA: Diagnosis not present

## 2022-04-02 DIAGNOSIS — G2581 Restless legs syndrome: Secondary | ICD-10-CM | POA: Diagnosis not present

## 2022-04-02 DIAGNOSIS — M1611 Unilateral primary osteoarthritis, right hip: Secondary | ICD-10-CM | POA: Diagnosis not present

## 2022-04-02 DIAGNOSIS — M5442 Lumbago with sciatica, left side: Secondary | ICD-10-CM | POA: Diagnosis not present

## 2022-04-02 DIAGNOSIS — M47816 Spondylosis without myelopathy or radiculopathy, lumbar region: Secondary | ICD-10-CM | POA: Diagnosis not present

## 2022-04-17 ENCOUNTER — Encounter
Admission: RE | Admit: 2022-04-17 | Discharge: 2022-04-17 | Disposition: A | Discharge status: Home / Self Care | Payer: Medicare HMO | Source: Ambulatory Visit | Attending: Obstetrics and Gynecology | Admitting: Obstetrics and Gynecology

## 2022-04-17 ENCOUNTER — Other Ambulatory Visit: Payer: Self-pay

## 2022-04-17 NOTE — Patient Instructions (Addendum)
Your procedure is scheduled on: Wednesday 04/24/22 Report to the Registration Desk on the 1st floor of the [facility]. To find out your arrival time, please call [phone] between 1PM - 3PM on: Tuesday 04/23/22 If your arrival time is 6:00 am, do not arrive prior to that time as the [facility] entrance doors do not open until 6:00 am.  REMEMBER: Instructions that are not followed completely may result in serious medical risk, up to and including death; or upon the discretion of your surgeon and anesthesiologist your surgery may need to be rescheduled.  Do not eat food after midnight the night before surgery.  No gum chewing, lozengers or hard candies.  You may however, drink CLEAR liquids up to 2 hours before you are scheduled to arrive for your surgery. Do not drink anything within 2 hours of your scheduled arrival time.  Clear liquids include: - water  - apple juice without pulp - gatorade (not RED colors) - black coffee or tea (Do NOT add milk or creamers to the coffee or tea) Do NOT drink anything that is not on this list.  In addition, your doctor has ordered for you to drink the provided  Ensure Pre-Surgery Clear Carbohydrate Drink  Drinking this carbohydrate drink up to two hours before surgery helps to reduce insulin resistance and improve patient outcomes. Please complete drinking 2 hours prior to scheduled arrival time.  TAKE THESE MEDICATIONS THE MORNING OF SURGERY WITH A SIP OF WATER: gabapentin (NEURONTIN) 400 MG capsule in needed methylphenidate (RITALIN) 20 MG tablet sertraline (ZOLOFT) 100 MG tablet  esomeprazole (NEXIUM) 40 MG capsule (take one the night before and one on the morning of surgery - helps to prevent nausea after surgery.)  One week prior to surgery: Stop Anti-inflammatories (NSAIDS) such as Advil, Aleve, Ibuprofen, Motrin, Naproxen, Naprosyn and Aspirin based products such as Excedrin, Goodys Powder, BC Powder.  Stop ANY OVER THE COUNTER  supplements until after surgery.  You may however, continue to take Tylenol if needed for pain up until the day of surgery.  No Alcohol for 24 hours before or after surgery.  No Smoking including e-cigarettes for 24 hours prior to surgery.  No chewable tobacco products for at least 6 hours prior to surgery.  No nicotine patches on the day of surgery.  Do not use any "recreational" drugs for at least a week prior to your surgery.  Please be advised that the combination of cocaine and anesthesia may have negative outcomes, up to and including death. If you test positive for cocaine, your surgery will be cancelled.  On the morning of surgery brush your teeth with toothpaste and water, you may rinse your mouth with mouthwash if you wish. Do not swallow any toothpaste or mouthwash.  Do not wear jewelry, make-up, hairpins, clips or nail polish.  Do not wear lotions, powders, or perfumes.   Do not shave body from the neck down 48 hours prior to surgery just in case you cut yourself which could leave a site for infection.  Also, freshly shaved skin may become irritated if using the CHG soap.  Do not bring valuables to the hospital. [facility] is not responsible for any missing/lost belongings or valuables.   Notify your doctor if there is any change in your medical condition (cold, fever, infection).  Wear comfortable clothing (specific to your surgery type) to the hospital.  After surgery, you can help prevent lung complications by doing breathing exercises.  Take deep breaths and cough  every 1-2 hours. .  If you are being discharged the day of surgery, you will not be allowed to drive home. You will need a responsible adult (18 years or older) to drive you home and stay with you that night.   If you are taking public transportation, you will need to have a responsible adult (18 years or older) with you. Please confirm with your physician that it is acceptable to use public  transportation.   Please call the [facility] Dept. at [phone] if you have any questions about these instructions.  Surgery Visitation Policy:  Patients undergoing a surgery or procedure may have two family members or support persons with them as long as the person is not COVID-19 positive or experiencing its symptoms.   Inpatient Visitation:    Visiting hours are 7 a.m. to 8 p.m. Up to four visitors are allowed at one time in a patient room, including children. The visitors may rotate out with other people during the day. One designated support person (adult) may remain overnight.

## 2022-04-19 ENCOUNTER — Encounter
Admission: RE | Admit: 2022-04-19 | Discharge: 2022-04-19 | Disposition: A | Discharge status: Home / Self Care | Payer: Medicare HMO | Source: Ambulatory Visit | Attending: Obstetrics and Gynecology | Admitting: Obstetrics and Gynecology

## 2022-04-19 DIAGNOSIS — N903 Dysplasia of vulva, unspecified: Secondary | ICD-10-CM | POA: Diagnosis not present

## 2022-04-19 DIAGNOSIS — Z01818 Encounter for other preprocedural examination: Secondary | ICD-10-CM

## 2022-04-19 DIAGNOSIS — Z01812 Encounter for preprocedural laboratory examination: Secondary | ICD-10-CM | POA: Insufficient documentation

## 2022-04-19 LAB — TYPE AND SCREEN
ABO/RH(D): A POS
Antibody Screen: NEGATIVE

## 2022-04-23 MED ORDER — LACTATED RINGERS IV SOLN: INTRAVENOUS | Status: DC

## 2022-04-23 MED ORDER — CHLORHEXIDINE GLUCONATE 0.12 % MT SOLN
15.0000 mL | Freq: Once | OROMUCOSAL | Status: AC
  Administered 2022-04-24: 15 mL via OROMUCOSAL

## 2022-04-23 MED ORDER — ORAL CARE MOUTH RINSE: 15.0000 mL | Freq: Once | OROMUCOSAL | Status: AC

## 2022-04-24 ENCOUNTER — Other Ambulatory Visit: Payer: Self-pay

## 2022-04-24 ENCOUNTER — Encounter: Payer: Self-pay | Admitting: Obstetrics and Gynecology

## 2022-04-24 ENCOUNTER — Ambulatory Visit
Admission: RE | Admit: 2022-04-24 | Discharge: 2022-04-24 | Disposition: A | Discharge status: Home / Self Care | Payer: Medicare HMO | Attending: Obstetrics and Gynecology | Admitting: Obstetrics and Gynecology

## 2022-04-24 ENCOUNTER — Ambulatory Visit: Payer: Medicare HMO | Admitting: Urgent Care

## 2022-04-24 ENCOUNTER — Encounter
Admission: RE | Disposition: A | Discharge status: Home / Self Care | Payer: Self-pay | Source: Home / Self Care | Attending: Obstetrics and Gynecology

## 2022-04-24 ENCOUNTER — Ambulatory Visit: Payer: Medicare HMO | Admitting: Anesthesiology

## 2022-04-24 DIAGNOSIS — Z6835 Body mass index (BMI) 35.0-35.9, adult: Secondary | ICD-10-CM | POA: Diagnosis not present

## 2022-04-24 DIAGNOSIS — E669 Obesity, unspecified: Secondary | ICD-10-CM | POA: Diagnosis not present

## 2022-04-24 DIAGNOSIS — N903 Dysplasia of vulva, unspecified: Secondary | ICD-10-CM | POA: Insufficient documentation

## 2022-04-24 DIAGNOSIS — G4730 Sleep apnea, unspecified: Secondary | ICD-10-CM | POA: Insufficient documentation

## 2022-04-24 DIAGNOSIS — K219 Gastro-esophageal reflux disease without esophagitis: Secondary | ICD-10-CM | POA: Insufficient documentation

## 2022-04-24 HISTORY — PX: VULVA /PERINEUM BIOPSY: SHX319

## 2022-04-24 HISTORY — PX: COLPOSCOPY: SHX161

## 2022-04-24 SURGERY — EXAM UNDER ANESTHESIA
Anesthesia: General

## 2022-04-24 MED ORDER — PROPOFOL 10 MG/ML IV BOLUS
INTRAVENOUS | Status: AC
  Filled 2022-04-24: qty 40

## 2022-04-24 MED ORDER — ACETIC ACID 3 % SOLN
Status: DC | PRN
  Administered 2022-04-24: 1 via TOPICAL

## 2022-04-24 MED ORDER — BUPIVACAINE HCL (PF) 0.25 % IJ SOLN
INTRAMUSCULAR | Status: AC
  Filled 2022-04-24: qty 30

## 2022-04-24 MED ORDER — KETOROLAC TROMETHAMINE 30 MG/ML IJ SOLN
INTRAMUSCULAR | Status: DC | PRN
  Administered 2022-04-24: 30 mg via INTRAVENOUS

## 2022-04-24 MED ORDER — BUPIVACAINE LIPOSOME 1.3 % IJ SUSP
INTRAMUSCULAR | Status: DC | PRN
  Administered 2022-04-24: 6 mL

## 2022-04-24 MED ORDER — FENTANYL CITRATE (PF) 100 MCG/2ML IJ SOLN: 25.0000 ug | INTRAMUSCULAR | Status: DC | PRN

## 2022-04-24 MED ORDER — PROMETHAZINE HCL 25 MG/ML IJ SOLN: 6.2500 mg | INTRAMUSCULAR | Status: DC | PRN

## 2022-04-24 MED ORDER — ACETAMINOPHEN 10 MG/ML IV SOLN: 1000.0000 mg | Freq: Once | INTRAVENOUS | Status: DC | PRN

## 2022-04-24 MED ORDER — FENTANYL CITRATE (PF) 100 MCG/2ML IJ SOLN
INTRAMUSCULAR | Status: AC
  Filled 2022-04-24: qty 2

## 2022-04-24 MED ORDER — BUPIVACAINE LIPOSOME 1.3 % IJ SUSP
INTRAMUSCULAR | Status: AC
  Filled 2022-04-24: qty 20

## 2022-04-24 MED ORDER — FENTANYL CITRATE (PF) 100 MCG/2ML IJ SOLN
INTRAMUSCULAR | Status: DC | PRN
  Administered 2022-04-24: 100 ug via INTRAVENOUS

## 2022-04-24 MED ORDER — MIDAZOLAM HCL 2 MG/2ML IJ SOLN
INTRAMUSCULAR | Status: AC
  Filled 2022-04-24: qty 2

## 2022-04-24 MED ORDER — IODINE STRONG (LUGOLS) 5 % PO SOLN
ORAL | Status: AC
  Filled 2022-04-24: qty 1

## 2022-04-24 MED ORDER — PROPOFOL 500 MG/50ML IV EMUL
INTRAVENOUS | Status: DC | PRN
  Administered 2022-04-24: 100 ug/kg/min via INTRAVENOUS

## 2022-04-24 MED ORDER — DROPERIDOL 2.5 MG/ML IJ SOLN: 0.6250 mg | Freq: Once | INTRAMUSCULAR | Status: DC | PRN

## 2022-04-24 MED ORDER — OXYCODONE HCL 5 MG PO TABS: 5.0000 mg | ORAL_TABLET | Freq: Once | ORAL | Status: DC | PRN

## 2022-04-24 MED ORDER — ONDANSETRON HCL 4 MG/2ML IJ SOLN
INTRAMUSCULAR | Status: DC | PRN
  Administered 2022-04-24: 4 mg via INTRAVENOUS

## 2022-04-24 MED ORDER — DEXMEDETOMIDINE (PRECEDEX) IN NS 20 MCG/5ML (4 MCG/ML) IV SYRINGE
PREFILLED_SYRINGE | INTRAVENOUS | Status: DC | PRN
  Administered 2022-04-24: 8 ug via INTRAVENOUS

## 2022-04-24 MED ORDER — OXYCODONE HCL 5 MG/5ML PO SOLN: 5.0000 mg | Freq: Once | ORAL | Status: DC | PRN

## 2022-04-24 MED ORDER — PHENYLEPHRINE 80 MCG/ML (10ML) SYRINGE FOR IV PUSH (FOR BLOOD PRESSURE SUPPORT)
PREFILLED_SYRINGE | INTRAVENOUS | Status: DC | PRN
  Administered 2022-04-24 (×3): 80 ug via INTRAVENOUS

## 2022-04-24 MED ORDER — ACETIC ACID 3 % SOLN
Status: AC
  Filled 2022-04-24: qty 1000

## 2022-04-24 MED ORDER — MIDAZOLAM HCL 2 MG/2ML IJ SOLN
INTRAMUSCULAR | Status: DC | PRN
  Administered 2022-04-24: 4 mg via INTRAVENOUS

## 2022-04-24 MED ORDER — SILVER SULFADIAZINE 1 % EX CREA
TOPICAL_CREAM | CUTANEOUS | Status: AC
  Filled 2022-04-24: qty 85

## 2022-04-24 SURGICAL SUPPLY — 40 items
BLADE SURG 15 STRL LF DISP TIS (BLADE) ×3 IMPLANT
BLADE SURG 15 STRL SS (BLADE) ×3
BLADE SURG SZ10 CARB STEEL (BLADE) ×4 IMPLANT
CATH ROBINSON RED A/P 16FR (CATHETERS) ×5 IMPLANT
CNTNR SPEC 2.5X3XGRAD LEK (MISCELLANEOUS) ×2
CONT SPEC 4OZ STER OR WHT (MISCELLANEOUS) ×1
CONT SPEC 4OZ STRL OR WHT (MISCELLANEOUS) ×2
CONTAINER SPEC 2.5X3XGRAD LEK (MISCELLANEOUS) ×3 IMPLANT
DRAPE 3/4 80X56 (DRAPES) ×4 IMPLANT
DRAPE PERI LITHO V/GYN (MISCELLANEOUS) ×4 IMPLANT
DRAPE UNDER BUTTOCK W/FLU (DRAPES) ×4 IMPLANT
DRSG TELFA 3X8 NADH (GAUZE/BANDAGES/DRESSINGS) ×3 IMPLANT
ELECT CAUTERY BLADE 6.4 (BLADE) ×4 IMPLANT
ELECT CAUTERY NEEDLE TIP 1.0 (MISCELLANEOUS) ×3
ELECT REM PT RETURN 9FT ADLT (ELECTROSURGICAL) ×3
ELECTRODE CAUTERY NEDL TIP 1.0 (MISCELLANEOUS) ×3 IMPLANT
ELECTRODE REM PT RTRN 9FT ADLT (ELECTROSURGICAL) ×3 IMPLANT
GAUZE 4X4 16PLY ~~LOC~~+RFID DBL (SPONGE) ×8 IMPLANT
GLOVE BIO SURGEON STRL SZ 6.5 (GLOVE) ×4 IMPLANT
GLOVE SURG UNDER LTX SZ7 (GLOVE) ×4 IMPLANT
GOWN STRL REUS W/ TWL LRG LVL3 (GOWN DISPOSABLE) ×6 IMPLANT
GOWN STRL REUS W/TWL LRG LVL3 (GOWN DISPOSABLE) ×6
MANIFOLD NEPTUNE II (INSTRUMENTS) ×4 IMPLANT
MARKER SKIN DUAL TIP RULER LAB (MISCELLANEOUS) ×4 IMPLANT
NEEDLE HYPO 22GX1.5 SAFETY (NEEDLE) ×4 IMPLANT
NS IRRIG 500ML POUR BTL (IV SOLUTION) ×4 IMPLANT
PACK BASIN MINOR ARMC (MISCELLANEOUS) ×4 IMPLANT
PAD DRESSING TELFA 3X8 NADH (GAUZE/BANDAGES/DRESSINGS) ×3 IMPLANT
PAD OB MATERNITY 4.3X12.25 (PERSONAL CARE ITEMS) ×4 IMPLANT
PAD PREP 24X41 OB/GYN DISP (PERSONAL CARE ITEMS) ×4 IMPLANT
SOL PREP PVP 2OZ (MISCELLANEOUS) ×3
SOLUTION PREP PVP 2OZ (MISCELLANEOUS) ×3 IMPLANT
SUT VIC AB 2-0 SH 27 (SUTURE)
SUT VIC AB 2-0 SH 27XBRD (SUTURE) ×3 IMPLANT
SUT VIC AB 3-0 SH 27 (SUTURE)
SUT VIC AB 3-0 SH 27X BRD (SUTURE) ×3 IMPLANT
SUT VIC AB 4-0 SH 27 (SUTURE)
SUT VIC AB 4-0 SH 27XANBCTRL (SUTURE) IMPLANT
SYR CONTROL 10ML LL (SYRINGE) ×4 IMPLANT
WATER STERILE IRR 500ML POUR (IV SOLUTION) ×4 IMPLANT

## 2022-04-24 NOTE — Discharge Instructions (Signed)
AMBULATORY SURGERY  ?DISCHARGE INSTRUCTIONS ? ? ?The drugs that you were given will stay in your system until tomorrow so for the next 24 hours you should not: ? ?Drive an automobile ?Make any legal decisions ?Drink any alcoholic beverage ? ? ?You may resume regular meals tomorrow.  Today it is better to start with liquids and gradually work up to solid foods. ? ?You may eat anything you prefer, but it is better to start with liquids, then soup and crackers, and gradually work up to solid foods. ? ? ?Please notify your doctor immediately if you have any unusual bleeding, trouble breathing, redness and pain at the surgery site, drainage, fever, or pain not relieved by medication. ? ? ? ?Additional Instructions: ? ? ? ?Please contact your physician with any problems or Same Day Surgery at [phone], Monday through Friday 6 am to 4 pm, or [facility] Main number at [phone].  ?

## 2022-04-24 NOTE — Anesthesia Preprocedure Evaluation (Addendum)
Anesthesia Evaluation  Patient identified by MRN, date of birth, ID band Patient awake    Reviewed: Allergy & Precautions, H&P , NPO status , Patient's Chart, lab work & pertinent test results  History of Anesthesia Complications (+) PONV and history of anesthetic complications  Airway Mallampati: III  TM Distance: <3 FB Neck ROM: limited    Dental no notable dental hx.    Pulmonary sleep apnea , neg recent URI,    Pulmonary exam normal        Cardiovascular Exercise Tolerance: Good (-) angina(-) Past MI Normal cardiovascular exam     Neuro/Psych  Headaches, neg Seizures PSYCHIATRIC DISORDERS Anxiety Depression Chiari malformation s/p surgical decompression with intermittent nausea, dizziness  Neuromuscular disease    GI/Hepatic Neg liver ROS, GERD  Controlled,  Endo/Other  negative endocrine ROS  Renal/GU      Musculoskeletal  (+) Arthritis ,   Abdominal Normal abdominal exam  (+)   Peds  Hematology negative hematology ROS (+)   Anesthesia Other Findings BMI    Body Mass Index: 40.73 kg/m    Past Medical History: No date: Back pain No date: Chiari malformation     Comment:  s/p sgy decompression No date: Chronic tension headaches No date: Complication of anesthesia No date: DDD (degenerative disc disease), lumbar No date: Depression No date: Diverticulitis No date: Dysmenorrhea No date: Endometriosis No date: Gas pain No date: GERD (gastroesophageal reflux disease) No date: Hidradenitis No date: Hyperlipidemia No date: Obesity No date: Osteoarthritis No date: Ovarian cyst     Comment:  hx No date: PONV (postoperative nausea and vomiting) No date: Sleep apnea     Comment:  central sleep apnea  No CPAP right now No date: Vitamin B12 deficiency     Comment:  On injections  Past Surgical History: 03/27/07: ABDOMINAL HYSTERECTOMY     Comment:  laparoscopic 2004: BREAST EXCISIONAL BIOPSY; Left      Comment:  fibroadenoma No date: CESAREAN SECTION     Comment:  1999/2004 twins No date: chiari decompression 04/28/2015: COLONOSCOPY WITH PROPOFOL; N/A     Comment:  Procedure: COLONOSCOPY WITH PROPOFOL;  Surgeon: Manya Silvas, MD;  Location: [place];  Service:               Endoscopy;  Laterality: N/A; No date: CYSTOSCOPY     Comment:  bladder ulcers 04/28/2015: ESOPHAGOGASTRODUODENOSCOPY; N/A     Comment:  Procedure: ESOPHAGOGASTRODUODENOSCOPY (EGD);  Surgeon:               Manya Silvas, MD;  Location: [facility];                Service: Endoscopy;  Laterality: N/A; 2003: HERNIA REPAIR; Right     Comment:  inguinal 2001: OVARIAN CYST REMOVAL     Comment:  right 2000: TEMPOROMANDIBULAR JOINT SURGERY No date: TUBAL LIGATION     Comment:  Bilateral  BMI    Body Mass Index: 38.47 kg/m      Reproductive/Obstetrics negative OB ROS                            Anesthesia Physical  Anesthesia Plan  ASA: III  Anesthesia Plan: General   Post-op Pain Management:    Induction: Intravenous  PONV Risk Score and Plan: Propofol infusion and TIVA  Airway Management Planned: Natural Airway and Mask  Additional  Equipment:   Intra-op Plan:   Post-operative Plan:   Informed Consent: I have reviewed the patients History and Physical, chart, labs and discussed the procedure including the risks, benefits and alternatives for the proposed anesthesia with the patient or authorized representative who has indicated his/her understanding and acceptance.     Dental Advisory Given  Plan Discussed with: Anesthesiologist, CRNA and Surgeon  Anesthesia Plan Comments: (Patient consented for risks of anesthesia including but not limited to:  - adverse reactions to medications - risk of airway placement if required - damage to eyes, teeth, lips or other oral mucosa - nerve damage due to positioning  - sore throat or hoarseness - Damage  to heart, brain, nerves, lungs, other parts of body or loss of life  Patient voiced understanding.)       Anesthesia Quick Evaluation

## 2022-04-24 NOTE — Op Note (Addendum)
   Operative Note  07/19/2015 12:25 PM  PRE-OP DIAGNOSIS: VIN    POST-OP DIAGNOSIS: VIN  SURGEON: Surgeon(s) and Role:    * Kristian Mogg Gaetana Michaelis, MD - Primary  ANESTHESIA: Choice   PROCEDURE: Procedure(s): EXAM UNDER ANESTHESIA, COLPOSCOPY OF THE VULVA, ABLATION OF VULVAR LESIONS, VULVAR BIOPSY, AND PERIURETHRAL BIOPSY   ESTIMATED BLOOD LOSS: less than 10 mL  DRAINS: NONE   SPECIMENS: vulvar and periurethral biopsies  COMPLICATIONS: NONE   DISPOSITION: PACU - hemodynamically stable.  CONDITION: stable  INDICATIONS: Symptomatic VIN located adjacent to the urethra. The patient emptied her bladder prior to going to the OR.  FINDINGS: Exam under anesthesia and colposcopy of the vulva after application of acetic acid revealed 20 x 8 mm acetowhite epithelium involving the vulvar tissue adjacent to the urethra. The vagina and cervix were normal. There were no other lesions. Bimanual exam was negative for masses or nodularity.   PROCEDURE IN DETAIL: After informed consent was obtained, the patient was taken to the operating room where anesthesia was obtained without difficulty. The patient was positioned in the dorsal lithotomy position in [place] stirrups.  Time-out was performed. The patient was examined under anesthesia, and the above findings were noted. She was prepped with 1/2 Hibiclens and 1/2 NS. The patient was draped using sterile blue towels. The vulvar and periurethral biopsies were completed.  Ablation of the vulva was performed using the Bovie to the second dermal plane and all visible vulvar lesions were ablated.Hemostasis was obtained with cauterization. The patient tolerated the procedure well.  Exparel was injected. Sponge, lap and needle counts were correct x2.  The patient was taken to recovery room in excellent condition.  Antibiotics: Not given because not indicated by scheduled procedure.  Wound: clean   VTE prophylaxis: was ordered  perioperatively.    Gillis Ends, MD

## 2022-04-24 NOTE — Transfer of Care (Signed)
Immediate Anesthesia Transfer of Care Note  Patient: Kathleen Everett  Procedure(s) Performed: EXAM UNDER ANESTHESIA COLPOSCOPY, ABLATION OF VULVAR TISSUE VULVAR BIOPSY  Patient Location: PACU  Anesthesia Type:General  Level of Consciousness: awake, alert  and oriented  Airway & Oxygen Therapy: Patient Spontanous Breathing and Patient connected to face mask oxygen  Post-op Assessment: Report given to RN, Post -op Vital signs reviewed and stable and Patient moving all extremities  Post vital signs: Reviewed and stable  Last Vitals:  Vitals Value Taken Time  BP 100/67 04/24/22 1345  Temp    Pulse 65 04/24/22 1348  Resp 19 04/24/22 1348  SpO2 96 % 04/24/22 1348    Last Pain:  Vitals:   04/24/22 1030  TempSrc: Oral  PainSc: 5          Complications: No notable events documented.

## 2022-04-24 NOTE — H&P (Signed)
Gynecologic Oncology H&P   Kathleen Everett has no significant changes since she was last seen. Her labs are acceptable for surgery. Consent is signed. We will proceed with surgery as planned.  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   She presented with pain and slow healing hole in LT labia minora; sx off and on for over a year. Has had neg yeast and BV cultures, neg HSV 2 testing. Pt has tried nystatin crm, estrace crm, coconut oil, and vaseline without full relief. She couldn't tolerate lotrisone cream.    03/04/2022 On exam noted to have anterior vulvar lesion that was biopsied. 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    Non-smoker, No abnormal Pap - last Pap 06/24/2019 NIML satisfactory with transformation zone. She doesn't recall a h/o abnormal Pap but thinks she had a LEEP in 2011.    She was referred for evaluation.      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   Ear fullness 02/09/2022   Runny nose 09/30/2021   Dizziness 07/22/2021   Vitamin B12 deficiency     Vaginal irritation 03/03/2021   Soft tissue mass 03/03/2021   Hemorrhoids 12/23/2020   DDD (degenerative disc disease), lumbar 10/03/2020   Abscess of right hip 05/11/2020   Healthcare maintenance 01/02/2020   Anemia 01/02/2020   Status post total hip replacement, 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   Hyperglycemia 06/24/2019   Vaginal bleeding 09/30/2016   Primary osteoarthritis of right hip 01/17/2016   Chronic gastritis 07/18/2015   Depression, major, in remission ([place]) 05/01/2015   Generalized anxiety disorder 05/01/2015   Chronic tension-type headache, intractable  04/24/2015   Dysmenorrhea 04/24/2015   Endometriosis 04/24/2015   Hidradenitis 04/24/2015   Hot flashes 03/12/2015   Chest pain 02/15/2015   BMI 35.0-35.9,adult 10/16/2014   Cough 09/28/2014   Breast pain, right 09/08/2014   Abdominal pain 07/24/2014   GERD (gastroesophageal reflux disease) 04/24/2014   Ankle pain, right 04/24/2014   Chiari malformation type I 01/26/2014   Vitamin D deficiency 01/18/2014   Diverticulitis 01/10/2014   Hypercholesteremia 01/10/2014   Right hip pain 01/10/2014   Migraine headaches 01/10/2014   Obstructive sleep apnea 01/10/2014   Chronic back pain 12/13/2012   Mitral valve prolapse 12/13/2012      Past Medical History:     Past Medical History:  Diagnosis Date   Abdominal pain 07/24/2014   Abscess of right hip 05/11/2020   Anemia 01/02/2020   Ankle pain, right 04/24/2014   Breast pain, right 09/08/2014   Chest pain 02/15/2015   Chiari malformation type I 01/26/2014    s/p sgy decompression   Chronic back pain 12/13/2012   Chronic gastritis 07/18/2015   Chronic tension-type headache, intractable 04/24/2015   DDD (degenerative disc disease), lumbar     Diverticulitis     Dysmenorrhea     Dysplastic nevus 10/09/2015    R abdomen - moderate, limited margins free.   Endometriosis     Generalized anxiety disorder 05/01/2015    Has previously seen psychiatry. Having some issues with what she feels is related to stress and anxiety   GERD (gastroesophageal  reflux disease)     Hemorrhoids 12/23/2020   Hidradenitis     History of ovarian cyst     Hot flashes 03/12/2015   Hypercholesteremia 01/10/2014    Off lipitor. Elevated cholesterol. Labs discussed. Restart lipitor. Follow lipid panel and liver function tests.   Hyperglycemia 06/24/2019   Major depressive disorder 05/01/2015   Migraine headaches 01/10/2014    Has seen neurology. Topamax.   Mitral valve prolapse     Obesity     Obstructive sleep apnea 01/10/2014    nightly CPAP use   PONV  (postoperative nausea and vomiting)     Primary osteoarthritis of right hip 01/17/2016   Right hip pain 01/10/2014   Soft tissue mass 03/03/2021   Status post total hip replacement, 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   TMJ (dislocation of temporomandibular joint)     Vitamin B12 deficiency      On injections   Vitamin D deficiency 01/18/2014    Overview:  Level is 21.6  Formatting of this note might be different from the original. Level is 21.6      Past Surgical History:      Past Surgical History:  Procedure Laterality Date   ABDOMINAL HYSTERECTOMY   03/27/07    laparoscopic   BREAST EXCISIONAL BIOPSY Left 2004    fibroadenoma   CESAREAN SECTION        1999/2004 twins   chiari decompression       COLONOSCOPY WITH PROPOFOL N/A 04/28/2015    Procedure: COLONOSCOPY WITH PROPOFOL;  Surgeon: Manya Silvas, MD;  Location: [facility] ENDOSCOPY;  Service: Endoscopy;  Laterality: N/A;   COLONOSCOPY WITH PROPOFOL N/A 03/19/2021    Procedure: COLONOSCOPY WITH PROPOFOL;  Surgeon: Lesly Rubenstein, MD;  Location: [facility] ENDOSCOPY;  Service: Endoscopy;  Laterality: N/A;   CYSTOSCOPY        bladder ulcers   ESOPHAGOGASTRODUODENOSCOPY N/A 04/28/2015    Procedure: ESOPHAGOGASTRODUODENOSCOPY (EGD);  Surgeon: Manya Silvas, MD;  Location: [facility] ENDOSCOPY;  Service: Endoscopy;  Laterality: N/A;   ESOPHAGOGASTRODUODENOSCOPY (EGD) WITH PROPOFOL N/A 03/19/2021    Procedure: ESOPHAGOGASTRODUODENOSCOPY (EGD) WITH PROPOFOL;  Surgeon: Lesly Rubenstein, MD;  Location: [facility] ENDOSCOPY;  Service: Endoscopy;  Laterality: N/A;   HERNIA REPAIR Right 2003    inguinal   INCISION AND DRAINAGE HIP Right 05/11/2020    Procedure: IRRIGATION AND DEBRIDEMENT HIP;  Surgeon: Hessie Knows, MD;  Location: [facility] ORS;  Service: Orthopedics;  Laterality: Right;   OVARIAN CYST REMOVAL   2001    right   TEMPOROMANDIBULAR JOINT SURGERY   2000   TOTAL HIP ARTHROPLASTY Right 12/28/2019    Procedure: TOTAL HIP ARTHROPLASTY ANTERIOR APPROACH;  Surgeon: Hessie Knows, MD;  Location: [facility] ORS;  Service: Orthopedics;  Laterality: Right;   TUBAL LIGATION        Bilateral      Past Gynecologic History:  Menarche: 14 Menstrual details: n/a History of Abnormal pap: as per HPI Last pap: 06/24/2019 NIML satisfactory with transformation zone.  Sexually active: yes   OB History:                  OB History  Gravida Para Term Preterm AB Living  '2 2 2     3  '$ SAB IAB Ectopic Multiple Live Births           1 3        # Outcome Date GA Lbr Len/2nd Weight Sex Delivery Anes PTL Lv  2 Term  1 Term                        Family History:      Family History  Problem Relation Age of Onset   Hyperlipidemia Mother     Anxiety disorder Mother     Atrial fibrillation Mother     Hypertension Mother     Memory loss Mother     Anxiety disorder Father     Stroke Father     Hyperlipidemia Father     Hypertension Father     Congestive Heart Failure Father     Depression Father     Hyperlipidemia Brother     Colon cancer Paternal Aunt     Dementia Paternal Aunt     Dementia Maternal Grandmother     Dementia Paternal Grandmother     ADD / ADHD Daughter     Breast cancer Neg Hx        Social History: Social History         Socioeconomic History   Marital status: Married      Spouse name: Not on file   Number of children: 3   Years of education: 13   Highest education level: Some college, no degree  Occupational History   Occupation: Disability  Tobacco Use   Smoking status: Never   Smokeless tobacco: Never  Vaping Use   Vaping Use: Never used  Substance and Sexual Activity   Alcohol use: Not Currently      Alcohol/week: 0.0 standard drinks   Drug use: No   Sexual activity: Yes      Birth control/protection: Surgical      Comment: Hysterectomy  Other Topics Concern   Not on file  Social History Narrative    Patient drinks 2-3 cups of caffeine daily.    Patient is right handed.          Social Hx:    Current  living situation- [place]. Living with husband and 3 kids    Born and Raised- [place]raised by both parents.     Siblings- 1 brother who is 7 yrs older than her    Legal issues- none    Married for 24 yrs. Currently unemployed since 2011 when she hurt her back. HS grad and CNA and phlebotomy.     Social Determinants of Health       Financial Resource Strain: Low Risk    Difficulty of Paying Living Expenses: Not hard at all  Food Insecurity: No Food Insecurity   Worried About Charity fundraiser in the Last Year: Never true   [place] in the Last Year: Never true  Transportation Needs: No Transportation Needs   Lack of Transportation (Medical): No   Lack of Transportation (Non-Medical): No  Physical Activity: Insufficiently Active   Days of Exercise per Week: 7 days   Minutes of Exercise per Session: 10 min  Stress: No Stress Concern Present   Feeling of Stress : Not at all  Social Connections: Socially Integrated   Frequency of Communication with Friends and Family: More than three times a week   Frequency of Social Gatherings with Friends and Family: More than three times a week   Attends Religious Services: More than 4 times per year   Active Member of [employer] or Organizations: Not on file   Attends Archivist Meetings: More than 4 times per year   Marital Status: Married  Human resources officer Violence:  Not At Risk   Fear of Current or Ex-Partner: No   Emotionally Abused: No   Physically Abused: No   Sexually Abused: No      Allergies:      Allergies  Allergen Reactions   Vancomycin Hives   Ceftin [Cefuroxime Axetil] Other (See Comments)      Upset stomach   Celexa [Citalopram Hydrobromide] Diarrhea      GI upset      Clindamycin/Lincomycin Rash   Penicillins Rash      Did it involve swelling of the face/tongue/throat, SOB, or low BP? No Did it involve sudden or severe rash/hives, skin peeling, or any reaction on the inside of your mouth or nose?  Unknown Did you need to seek medical attention at a hospital or doctor's office? No When did it last happen? childhood reaction If all above answers are "NO", may proceed with cephalosporin use.     Sulfa Antibiotics Rash      Current Medications:       Current Outpatient Medications  Medication Sig Dispense Refill   acetaminophen (TYLENOL) 325 MG tablet Take 650 mg by mouth every 6 (six) hours as needed.       AIMOVIG 140 MG/ML SOAJ Inject 140 mg into the skin every 28 (twenty-eight) days.       atorvastatin (LIPITOR) 20 MG tablet Take 1 tablet (20 mg total) by mouth daily. 90 tablet 3   azelastine (ASTELIN) 0.1 % nasal spray Place 1 spray into both nostrils 2 (two) times daily. Use in each nostril as directed 30 mL 1   clotrimazole-betamethasone (LOTRISONE) cream Apply externally BID for 2 wks 15 g 0   cyanocobalamin (,VITAMIN B-12,) 1000 MCG/ML injection INJECT 1 ML INTRAMUSCULARLY ONCE EVERY MONTH 10 mL 0   diclofenac sodium (VOLTAREN) 1 % GEL Apply 1 application topically 4 (four) times daily as needed (pain.).        erythromycin with ethanol (THERAMYCIN) 2 % external solution Apply topically 2 (two) times daily. As needed to affected areas of skin (pustules and bumps) 60 mL 3   esomeprazole (NEXIUM) 40 MG capsule Take 1 capsule by mouth once daily 90 capsule 3   gabapentin (NEURONTIN) 400 MG capsule Take by mouth.       lactase (RA DAIRY AID) 3000 units tablet Take 9,000 Units by mouth 3 (three) times daily as needed (with dairy consumption).        methocarbamol (ROBAXIN) 500 MG tablet Take 500 mg by mouth 4 (four) times daily.       methylphenidate (RITALIN) 20 MG tablet Take 1 tablet (20 mg total) by mouth daily with breakfast. 30 tablet 0   mupirocin ointment (BACTROBAN) 2 % Apply to healing wound TID. 22 g 1   nystatin cream (MYCOSTATIN) Apply topically 2 (two) times daily. 30 g 0   oxyCODONE-acetaminophen (PERCOCET) 10-325 MG tablet Take by mouth.       rizatriptan (MAXALT)  10 MG tablet Take 10 mg by mouth as needed for migraine. May repeat in 2 hours if needed       Semaglutide, 2 MG/DOSE, 8 MG/3ML SOPN Inject 2 mg as directed once a week. 9 mL 2   Semaglutide,0.25 or 0.'5MG'$ /DOS, (OZEMPIC, 0.25 OR 0.5 MG/DOSE,) 2 MG/1.5ML SOPN Inject 2 mg into the skin.       sertraline (ZOLOFT) 100 MG tablet Take 2 tablets (200 mg total) by mouth daily. 180 tablet 0   Simethicone (GAS RELIEF PO) Take 125 mg by mouth 2 (  two) times daily as needed for flatulence. Anti gas       Ubrogepant (UBRELVY) 100 MG TABS Take 100 mg by mouth 2 (two) times daily as needed.       doxycycline (VIBRA-TABS) 100 MG tablet Take 1 tablet (100 mg total) by mouth 2 (two) times daily. (Patient not taking: Reported on 03/20/2022) 10 tablet 0   fluconazole (DIFLUCAN) 150 MG tablet Take one tablet x 1.  May repeat in three days if symptoms persists. (Patient not taking: Reported on 03/20/2022) 2 tablet 0   predniSONE (DELTASONE) 10 MG tablet Take 4 tablets x 1 day and then decrease by 1/2 tablet her day until down to zero mg. (Patient not taking: Reported on 03/20/2022) 18 tablet 0    No current facility-administered medications for this visit.      Review of Systems General: weight loss on Ozempic; negative for fevers, night sweats Skin: negative for changes in moles or sores or rash Eyes: negative for changes in vision HEENT: tinnitus and hearing loss Pulmonary: negative for dyspnea, orthopnea, productive cough, wheezing Cardiac: negative for palpitations, pain Gastrointestinal: nausea, o/w negative for vomiting, constipation, diarrhea, hematemesis, hematochezia Genitourinary/Sexual: pain with urination suspect secondary to vulvar wound and incomplete emptying; o/w negative for hematuria, incontinence Ob/Gyn:  negative for abnormal bleeding, or pain Musculoskeletal: joint pain, leg and back pain Hematology: negative for easy bruising, abnormal bleeding Neurologic/Psych: headache and numbness, o/w negative  for seizures, paralysis, weakness,    Objective:  Physical Examination:  BP (!) 149/91   Pulse 74   Temp 98.7 F (37.1 C)   Resp 20   Ht '5\' 10"'$  (1.778 m)   Wt 229 lb 8 oz (104.1 kg)   LMP 12/08/2006   SpO2 (!) 10%   BMI 32.93 kg/m    ECOG Performance Status: 0 - Asymptomatic   GENERAL: Patient is a well appearing female in no acute distress HEENT:  PERRL, neck supple with midline trachea.   NODES:  No cervical, supraclavicular, axillary, or inguinal lymphadenopathy palpated.  LUNGS:  Clear to auscultation bilaterally.   HEART:  Regular rate and rhythm.  ABDOMEN:  Soft, nontender.  Nondistended. No ascites/masses.  INCISION: well healed incisions.  EXTREMITIES:  No peripheral edema.   SKIN:  Clear with no obvious rashes or skin changes. No nail dyscrasia. NEURO:  Nonfocal. Well oriented.  Appropriate affect.   Pelvic: EGBUS: abnormal lesion left upper vulva adjacent to urethra 2 x .8 cm lesion (see figure on consent) Cervix: no lesions, nontender, mobile Vagina: no lesions, no discharge or bleeding Uterus: absent Adnexa: no palpable masses       Lab Review Labs on site today:      Lab Results  Component Value Date    WBC 5.4 07/17/2021    HGB 13.5 07/17/2021    HCT 41.3 07/17/2021    MCV 86.3 07/17/2021    PLT 233.0 07/17/2021      Chemistry            Component Value Date/Time    NA 141 03/06/2022 0921    NA 139 04/08/2012 0000    NA 144 03/21/2012 0649    K 4.0 03/06/2022 0921    K 3.4 (L) 03/21/2012 0649    CL 106 03/06/2022 0921    CL 109 (H) 03/21/2012 0649    CO2 30 03/06/2022 0921    CO2 27 03/21/2012 0649    BUN 12 03/06/2022 0921    BUN 10 04/08/2012 0000  BUN 3 (L) 03/21/2012 0649    CREATININE 0.69 03/06/2022 0921    CREATININE 0.76 03/21/2012 0649    GLU 88 04/08/2012 0000           Component Value Date/Time    CALCIUM 8.9 03/06/2022 0921    CALCIUM 8.2 (L) 03/21/2012 0649    ALKPHOS 62 03/06/2022 0921    ALKPHOS 102 03/19/2012  0445    AST 24 03/06/2022 0921    AST 24 03/19/2012 0445    ALT 31 03/06/2022 0921    ALT 28 03/19/2012 0445    BILITOT 0.4 03/06/2022 0921    BILITOT 0.6 03/19/2012 0445      Wet prep 02/08/2022 negative        Lab Results  Component Value Date    HGBA1C 5.6 03/06/2022      Radiologic Imaging: N/a    Assessment:  Kathleen Everett is a 51 y.o. 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 Given close proximity to the urethra recommended ablation rather than surgical resection. Plan for EUA, colposcopy, biopsy x 2 (upper and lower aspect), and ablation of lesion. I asked Elmo Putt Copland to contact pathology and revise her pathology report to reflect vulvar biopsy and VIN diagnosis.    She was consented for an EUA, colposcopy, biopsy x 2 (upper and lower aspect), and ablation of lesion.     The risks of surgery were discussed in detail and she understands these to include infection; wound separation; hernia; injury to adjacent organs such as bowel, bladder, blood vessels, ureters and nerves; bleeding which may require blood transfusion; anesthesia risk; thromboembolic events; possible death; unforeseen complications; possible need for re-exploration; medical complications such as heart attack, stroke, and pneumonia; and, if staging performed the risk of lymphedema and lymphocyst.  The patient will receive DVT and antibiotic prophylaxis as indicated.  She voiced a clear understanding.  She had the opportunity to ask questions and electronic informed consent was obtained today.   Topical lidocaine ordered for pain control. Plan for EMLA postop. Reviewed postop care  recommendations. If she still has vulvar pain after surgery will need to reassess for other causes such as atrophy.    Repeat Pap in 06/2022   Suggested return to clinic in  4-6 weeks.     GYN VTE Prophylaxis:    Risk point calculation: TOTAL: 3 based on age, BMI, [lung disease (sleep apnea) does not count as a risk factor)   VTE Risk assessment:  This patient does not have cancer, elevated tumor markers or personal/family hx VTE. This is not a minor surgery. The calculated risk score is 3.  Moderate Risk:  Choose one of the following: One pre-op dose UFH or SCDs   She has multiple allergies. Will review indications for antibiotics for vulvar ablative procedures. While not the same procedure, antibiotics are not routinely administered prior to endometrial ablation.    The patient's diagnosis, an outline of the further diagnostic and laboratory studies which will be required, the recommendation, and alternatives were discussed.  All questions were answered to the patient's satisfaction.   A total of 80 minutes were  spent with the patient/family today; >50% was spent in education, counseling and coordination of care for vulvar dysplasia.    Janeice Stegall Gaetana Michaelis, MD

## 2022-04-25 LAB — SURGICAL PATHOLOGY

## 2022-04-25 NOTE — Anesthesia Postprocedure Evaluation (Signed)
Anesthesia Post Note  Patient: Kathleen Everett  Procedure(s) Performed: EXAM UNDER ANESTHESIA COLPOSCOPY, ABLATION OF VULVAR TISSUE VULVAR BIOPSY  Patient location during evaluation: PACU Anesthesia Type: General Level of consciousness: awake and alert Pain management: pain level controlled Vital Signs Assessment: post-procedure vital signs reviewed and stable Respiratory status: spontaneous breathing, nonlabored ventilation and respiratory function stable Cardiovascular status: blood pressure returned to baseline and stable Postop Assessment: no apparent nausea or vomiting Anesthetic complications: no   No notable events documented.   Last Vitals:  Vitals:   04/24/22 1500 04/24/22 1508  BP: 98/62 (!) 119/55  Pulse: 71 65  Resp: 12 14  Temp:  (!) 36.2 C  SpO2: 95% 99%    Last Pain:  Vitals:   04/24/22 1508  TempSrc: Temporal  PainSc: 0-No pain                 [place] Ouch

## 2022-04-26 ENCOUNTER — Encounter: Payer: Self-pay | Admitting: Obstetrics and Gynecology

## 2022-05-07 ENCOUNTER — Telehealth: Payer: Self-pay

## 2022-05-07 NOTE — Telephone Encounter (Signed)
Received call from Ms. Bertha regarding her surgical procedure. Now that swelling has reduced she feels that the area that  was bothering her was not biopsied. Procedure report and pathology report reviewed. Offered visit to see Dr. Gershon Crane partner for exam and to go over procedure performed in detail. Appointment arranged.

## 2022-05-08 ENCOUNTER — Inpatient Hospital Stay: Payer: Medicare HMO | Attending: Obstetrics and Gynecology | Admitting: Obstetrics and Gynecology

## 2022-05-08 VITALS — BP 129/71 | HR 83 | Temp 98.2°F | Resp 20 | Wt 225.3 lb

## 2022-05-08 DIAGNOSIS — Z9889 Other specified postprocedural states: Secondary | ICD-10-CM

## 2022-05-08 DIAGNOSIS — N903 Dysplasia of vulva, unspecified: Secondary | ICD-10-CM | POA: Insufficient documentation

## 2022-05-08 DIAGNOSIS — Z87412 Personal history of vulvar dysplasia: Secondary | ICD-10-CM | POA: Insufficient documentation

## 2022-05-08 DIAGNOSIS — Z79899 Other long term (current) drug therapy: Secondary | ICD-10-CM | POA: Insufficient documentation

## 2022-05-08 DIAGNOSIS — N901 Moderate vulvar dysplasia: Secondary | ICD-10-CM

## 2022-05-08 DIAGNOSIS — N909 Noninflammatory disorder of vulva and perineum, unspecified: Secondary | ICD-10-CM

## 2022-05-08 NOTE — Progress Notes (Signed)
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 Theora Gianotti on 04/24/22. She returns to clinic for concern over area treated. The area that has been irritated is just below the area that was treated in the OR 2 weeks ago.  No other complaints.  Figure from consent  On 04/24/22 she underwent EUA, Colpo of vulva, ablation with bovie, biopsy, and periurethral biopsy. Exam under anesthesia and colposcopy of the vulva after application of acetic acid revealed 20 x 0.8 mm acetowhite epithelium involving the vulvar tissue adjacent to the urethra. The vagina and cervix were normal. There were no lesion. Bimanual exam was negative for masses or nodularity. All visible vulvar lesions were ablated.   DIAGNOSIS:  A.  VULVA, INFERIOR; BIOPSY: 1:00 (0.7 x 0.2 x 0.2 cm) - HIGH-GRADE SQUAMOUS INTRAEPITHELIAL LESION (HSIL/VIN-2).   B.  PERI URETHRA; BIOPSY:  - HIGH-GRADE SQUAMOUS INTRAEPITHELIAL LESION (HSIL/VIN-2).   Gynecologic History:  She presented with pain and slow healing hole in LT labia minora; sx off and on for over a year. Has had neg yeast and BV cultures, neg HSV 2 testing. Pt has tried nystatin crm, estrace crm, coconut oil, and vaseline without full relief. She couldn't tolerate lotrisone cream.   03/04/2022 On exam noted to have anterior vulvar lesion that was biopsied. 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   Non-smoker, No abnormal Pap - last Pap 06/24/2019 NIML satisfactory with transformation zone. She doesn't recall a h/o abnormal Pap but thinks she had a LEEP in 2011.   She was referred for evaluation.    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   Ear fullness 02/09/2022   Runny nose 09/30/2021   Dizziness 07/22/2021   Vitamin B12 deficiency    Vaginal irritation 03/03/2021   Soft tissue mass 03/03/2021   Hemorrhoids 12/23/2020   DDD (degenerative disc disease), lumbar 10/03/2020   Abscess of right hip 05/11/2020   Healthcare maintenance 01/02/2020   Anemia 01/02/2020   Status post total hip replacement, 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   Hyperglycemia 06/24/2019   Vaginal bleeding 09/30/2016   Primary osteoarthritis of right hip 01/17/2016   Chronic gastritis 07/18/2015   Depression, major, in remission ([place]) 05/01/2015   Generalized anxiety disorder 05/01/2015   Chronic tension-type headache, intractable 04/24/2015   Dysmenorrhea 04/24/2015   Endometriosis 04/24/2015   Hidradenitis 04/24/2015   Hot flashes 03/12/2015   Chest pain 02/15/2015   BMI 35.0-35.9,adult 10/16/2014   Cough 09/28/2014   Breast pain, right 09/08/2014   Abdominal pain 07/24/2014   GERD (gastroesophageal reflux disease) 04/24/2014   Ankle pain, right 04/24/2014   Chiari malformation type I 01/26/2014   Vitamin D deficiency 01/18/2014   Diverticulitis 01/10/2014   Hypercholesteremia 01/10/2014   Right hip pain 01/10/2014   Migraine headaches 01/10/2014   Obstructive sleep apnea 01/10/2014   Chronic back pain 12/13/2012   Mitral valve prolapse 12/13/2012    Past Medical History: Past Medical History:  Diagnosis Date   Abdominal pain 07/24/2014   Abscess of right hip 05/11/2020   Anemia 01/02/2020   Ankle pain, right 04/24/2014   Breast pain, right 09/08/2014   Chest pain 02/15/2015   Chiari malformation type I  01/26/2014   s/p sgy decompression   Chronic back pain 12/13/2012   Chronic gastritis 07/18/2015   Chronic tension-type headache, intractable 04/24/2015   DDD (degenerative disc disease), lumbar    Diverticulitis    Dysmenorrhea    Dysplastic nevus 10/09/2015   R abdomen - moderate, limited margins free.   Endometriosis    Generalized  anxiety disorder 05/01/2015   Has previously seen psychiatry. Having some issues with what she feels is related to stress and anxiety   GERD (gastroesophageal reflux disease)    Hemorrhoids 12/23/2020   Hidradenitis    History of ovarian cyst    Hot flashes 03/12/2015   Hypercholesteremia 01/10/2014   Off lipitor. Elevated cholesterol. Labs discussed. Restart lipitor. Follow lipid panel and liver function tests.   Hyperglycemia 06/24/2019   Major depressive disorder 05/01/2015   Migraine headaches 01/10/2014   Has seen neurology. Topamax.   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 Level is 21.6    Past Surgical History: Past Surgical History:  Procedure Laterality Date   ABDOMINAL HYSTERECTOMY  03/27/07   laparoscopic   BREAST EXCISIONAL BIOPSY Left 2004   fibroadenoma   CESAREAN SECTION     1999/2004 twins   chiari decompression     COLONOSCOPY WITH PROPOFOL N/A 04/28/2015   Procedure: COLONOSCOPY WITH PROPOFOL;  Surgeon: Manya Silvas, MD;  Location: [facility] ENDOSCOPY;  Service: Endoscopy;  Laterality: N/A;   COLONOSCOPY WITH PROPOFOL N/A 03/19/2021   Procedure: COLONOSCOPY WITH PROPOFOL;  Surgeon: Lesly Rubenstein, MD;  Location: [facility] ENDOSCOPY;  Service: Endoscopy;  Laterality: N/A;   COLPOSCOPY N/A 04/24/2022   Procedure: COLPOSCOPY, ABLATION OF VULVAR TISSUE;  Surgeon: Gillis Ends, MD;  Location: [facility] ORS;  Service: Gynecology;  Laterality: N/A;   CYSTOSCOPY     bladder ulcers   ESOPHAGOGASTRODUODENOSCOPY  N/A 04/28/2015   Procedure: ESOPHAGOGASTRODUODENOSCOPY (EGD);  Surgeon: Manya Silvas, MD;  Location: [facility] ENDOSCOPY;  Service: Endoscopy;  Laterality: N/A;   ESOPHAGOGASTRODUODENOSCOPY (EGD) WITH PROPOFOL N/A 03/19/2021   Procedure: ESOPHAGOGASTRODUODENOSCOPY (EGD) WITH PROPOFOL;  Surgeon: Lesly Rubenstein, MD;  Location: [facility] ENDOSCOPY;  Service: Endoscopy;  Laterality: N/A;   HERNIA REPAIR Right 2003   inguinal   INCISION AND DRAINAGE HIP Right 05/11/2020   Procedure: IRRIGATION AND DEBRIDEMENT HIP;  Surgeon: Hessie Knows, MD;  Location: [facility] ORS;  Service: Orthopedics;  Laterality: Right;   OVARIAN CYST REMOVAL  2001   right   TEMPOROMANDIBULAR JOINT SURGERY  2000   TOTAL HIP ARTHROPLASTY Right 12/28/2019   Procedure: TOTAL HIP ARTHROPLASTY ANTERIOR APPROACH;  Surgeon: Hessie Knows, MD;  Location: [facility] ORS;  Service: Orthopedics;  Laterality: Right;   TUBAL LIGATION     Bilateral   VULVA /PERINEUM BIOPSY  04/24/2022   Procedure: VULVAR BIOPSY;  Surgeon: Gillis Ends, MD;  Location: [facility] ORS;  Service: Gynecology;;   Past Gynecologic History:  Menarche: 14 Menstrual details: n/a History of Abnormal pap: as per HPI Last pap: 06/24/2019 NIML  satisfactory with transformation zone.  Sexually active: yes  OB History:  OB History     Gravida  2   Para  2   Term  2   Preterm      AB      Living  3      SAB      IAB      Ectopic      Multiple  1   Live Births  3          Family History: Family History  Problem Relation Age of Onset   Hyperlipidemia Mother    Anxiety disorder Mother    Atrial fibrillation Mother    Hypertension Mother    Memory loss Mother    Anxiety disorder Father    Stroke Father    Hyperlipidemia Father    Hypertension Father    Congestive Heart Failure Father    Depression Father    Hyperlipidemia Brother    Colon cancer Paternal Aunt    Dementia Paternal Aunt    Dementia Maternal Grandmother    Dementia Paternal  Grandmother    ADD / ADHD Daughter    Breast cancer Neg Hx    Social History: Social History   Tobacco Use   Smoking status: Never   Smokeless tobacco: Never  Vaping Use   Vaping Use: Never used  Substance Use Topics   Alcohol use: Not Currently    Alcohol/week: 0.0 standard drinks of alcohol   Drug use: No    Allergies: Allergies  Allergen Reactions   Vancomycin Hives   Ceftin [Cefuroxime Axetil] Other (See Comments)    Upset stomach   Celexa [Citalopram Hydrobromide] Diarrhea    GI upset     Clindamycin/Lincomycin Rash   Penicillins Rash    Did it involve swelling of the face/tongue/throat, SOB, or low BP? No Did it involve sudden or severe rash/hives, skin peeling, or any reaction on the inside of your mouth or nose? Unknown Did you need to seek medical attention at a hospital or doctor's office? No When did it last happen? childhood reaction If all above answers are "NO", may proceed with cephalosporin use.    Sulfa Antibiotics Rash   Current Medications: Current Outpatient Medications on File Prior to Visit  Medication Sig Dispense Refill   acetaminophen (TYLENOL) 325 MG tablet Take 500 mg by mouth every 6 (six) hours as needed.     AIMOVIG 140 MG/ML SOAJ Inject 140 mg into the skin every 28 (twenty-eight) days.     atorvastatin (LIPITOR) 10 MG tablet Take 10 mg by mouth daily.     azelastine (ASTELIN) 0.1 % nasal spray Place 1 spray into both nostrils 2 (two) times daily. Use in each nostril as directed 30 mL 1   clobetasol (TEMOVATE) 0.05 % external solution Apply topically daily as needed.     clotrimazole-betamethasone (LOTRISONE) cream Apply externally BID for 2 wks 15 g 0   cyanocobalamin (,VITAMIN B-12,) 1000 MCG/ML injection INJECT 1 ML INTRAMUSCULARLY ONCE EVERY MONTH 10 mL 0   diclofenac sodium (VOLTAREN) 1 % GEL Apply 1 application topically 4 (four) times daily as needed (pain.).      diclofenac Sodium (VOLTAREN) 1 % GEL SMARTSIG:Gram(s) Topical 4  Times Daily PRN     esomeprazole (NEXIUM) 40 MG capsule Take 1 capsule by mouth once daily 90 capsule 3   gabapentin (NEURONTIN) 400 MG capsule Take by mouth.     ketoconazole (NIZORAL) 2 % shampoo Apply topically 3 (  three) times a week.     lactase (RA DAIRY AID) 3000 units tablet Take 9,000 Units by mouth 3 (three) times daily as needed (with dairy consumption).      lidocaine (XYLOCAINE) 2 % jelly Apply 1 application. topically as needed. 20 mL 0   methocarbamol (ROBAXIN) 500 MG tablet Take 500 mg by mouth 4 (four) times daily.     methylphenidate (RITALIN) 20 MG tablet Take 1 tablet (20 mg total) by mouth daily with breakfast. 30 tablet 0   mupirocin ointment (BACTROBAN) 2 % Apply to healing wound TID. 22 g 1   nystatin cream (MYCOSTATIN) Apply topically 2 (two) times daily. 30 g 0   oxyCODONE-acetaminophen (PERCOCET) 10-325 MG tablet Take by mouth.     rizatriptan (MAXALT) 10 MG tablet Take 10 mg by mouth as needed for migraine. May repeat in 2 hours if needed     Semaglutide, 2 MG/DOSE, 8 MG/3ML SOPN Inject 2 mg as directed once a week. 9 mL 2   Semaglutide,0.25 or 0.'5MG'$ /DOS, (OZEMPIC, 0.25 OR 0.5 MG/DOSE,) 2 MG/1.5ML SOPN Inject 2 mg into the skin.     sertraline (ZOLOFT) 100 MG tablet Take 2 tablets (200 mg total) by mouth daily. 180 tablet 0   Simethicone (GAS RELIEF PO) Take 125 mg by mouth 2 (two) times daily as needed for flatulence. Anti gas     Ubrogepant (UBRELVY) 100 MG TABS Take 100 mg by mouth 2 (two) times daily as needed.     erythromycin with ethanol (THERAMYCIN) 2 % external solution Apply topically 2 (two) times daily. As needed to affected areas of skin (pustules and bumps) (Patient not taking: Reported on 04/24/2022) 60 mL 3   No current facility-administered medications on file prior to visit.    Review of Systems General:  no complaints Skin: no complaints Eyes: no complaints HEENT: no complaints Breasts: no complaints Pulmonary: no complaints Cardiac: no  complaints Gastrointestinal: no complaints Genitourinary/Sexual: no complaints Musculoskeletal: no complaints Hematology: no complaints Neurologic/Psych: no complaints   Objective:  Physical Examination:  Today's Vitals   05/08/22 0931  BP: 129/71  Pulse: 83  Resp: 20  Temp: 98.2 F (36.8 C)  SpO2: 100%  Weight: 225 lb 4.8 oz (102.2 kg)  PainSc: 5   PainLoc: Back   Body mass index is 32.33 kg/m.  ECOG Performance Status: 0 - Asymptomatic  GENERAL: Patient is a well appearing female in no acute distress HEENT:  PERRL, neck supple with midline trachea.   NODES:  No cervical, supraclavicular, axillary, or inguinal lymphadenopathy palpated.  LUNGS:  Clear to auscultation bilaterally.   HEART:  Regular rate and rhythm.  ABDOMEN:  Soft, nontender.  Nondistended. No ascites/masses.  INCISION: well healed incisions.  EXTREMITIES:  No peripheral edema.   SKIN:  Clear with no obvious rashes or skin changes. No nail dyscrasia. NEURO:  Nonfocal. Well oriented.  Appropriate affect.  Pelvic: Exam chaperoned by nursing EGBUS: area that was ablated to left of urethra on inner labia is heeling well.  Looked with colposcope.  Small opening below at 4 o'clock that is irritated, but no evidence that this is a sinus tract or fistula to the rectum. Probed with Q tip and is very shallow. No evidence of residual VIN. Cervix: no lesions, nontender, mobile Vagina: no lesions, no discharge or bleeding Uterus: absent Adnexa: no palpable masses Rectal. No evidence of sinus tract on rectal exam. No discharge through small left vulvar area.       Lab Review  No labs on site today. Wet prep 02/08/2022 negative  Radiologic Imaging: N/a    Assessment:  Kathleen Everett is a 51 y.o. 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 Treated area is healing well.   Vulvar irritation persists  and on exam today there is a small depression at 4 o'clock, but no evidence of sinus tract of dysplasia on colposcopy.  Medical co-morbidities complicating care: Chiari malformation type I, Sleep apnea, hyperglycemia, prior laparotomy Plan:   Problem List Items Addressed This Visit       Genitourinary   Vulvar dysplasia - Primary    We discussed options for management and will have her come back in about a month and will biopsy off the irritated area on the inner left labia below where the laser procedure was done and will cauterize base with silver nitrate.   Repeat Pap in 06/2022  Beckey Rutter, [place], AGNP-C [place] [facility] [phone] (clinic)  I personally interviewed and examined the patient. Agreed with the above/below plan of care. I have directly contributed to assessment and plan of care of this patient and educated and discussed with patient and family.  Mellody Drown, MD   CC:  [name], MD [address] [place],  [place] [address] [phone]

## 2022-05-09 ENCOUNTER — Telehealth (HOSPITAL_BASED_OUTPATIENT_CLINIC_OR_DEPARTMENT_OTHER): Payer: Medicare HMO | Admitting: Psychiatry

## 2022-05-09 DIAGNOSIS — F411 Generalized anxiety disorder: Secondary | ICD-10-CM

## 2022-05-09 DIAGNOSIS — F99 Mental disorder, not otherwise specified: Secondary | ICD-10-CM | POA: Diagnosis not present

## 2022-05-09 DIAGNOSIS — F331 Major depressive disorder, recurrent, moderate: Secondary | ICD-10-CM | POA: Diagnosis not present

## 2022-05-09 DIAGNOSIS — F5105 Insomnia due to other mental disorder: Secondary | ICD-10-CM

## 2022-05-09 MED ORDER — VENLAFAXINE HCL ER 37.5 MG PO CP24: 37.5000 mg | ORAL_CAPSULE | Freq: Every day | ORAL | 0 refills | Status: DC

## 2022-05-09 NOTE — Progress Notes (Signed)
Virtual Visit via Video Note  I connected with Kathleen Everett on 05/09/22 at  9:00 AM EDT by a video enabled telemedicine application and verified that I am speaking with the correct person using two identifiers.  Location: Patient: home Provider: office   I discussed the limitations of evaluation and management by telemedicine and the availability of in person appointments. The patient expressed understanding and agreed to proceed.  History of Present Illness: Kathleen Everett has been feeling very sick for the last few days. The weather has been bad and this is usually a trigger for migraines. Kathleen Everett had surgery 04/24/22 for biopsy. She is still recovering. This past weekend she went to a [place] with heir family. She really didn't want to go and mostly spent time alone. Her depression is mostly the same. Some days it feels worse when she feels agitated and easily frustrated. This often leads to her fussing at the kids. She is trying to keep her mood stable. She is depressed everyday and notes that she feels "lost or something is missing". She is getting about 4 hrs of broken sleep. Her energy is low. Her appetite is a little decreased due to feeling nauseated. Her concentration remains poor. Next month she has an evaluation for speech issues. She has trouble getting her words out like she wants. Kathleen Everett denies SI/HI and denies passive thoughts of death. She has thoughts sometimes about wishing to be alone or running away. Her anxiety is on going. She has days where she feels very nervous. She feels overly worried about things she can't control. She has racing thoughts, restlessness and inability to calm herself. She feels anxious 4 out of 7 days a week. Her medications is helping some. She definitely feels it when she misses a dose because she is more emotional and cries more. Kathleen Everett denies SE. At our last visit we stopped Ritalin due to SE. Now she states it did help with energy but she can't  tolerate the stomach SE.    Observations/Objective: Psychiatric Specialty Exam: ROS  Last menstrual period 12/08/2006.There is no height or weight on file to calculate BMI.  General Appearance: Casual  Eye Contact:  Good  Speech:  Clear and Coherent and Normal Rate  Volume:  Normal  Mood:  Anxious and Depressed  Affect:  Congruent  Thought Process:  Goal Directed, Linear, and Descriptions of Associations: Intact  Orientation:  Full (Time, Place, and Person)  Thought Content:  Logical  Suicidal Thoughts:  No  Homicidal Thoughts:  No  Memory:  Immediate;   Good  Judgement:  Good  Insight:  Good  Psychomotor Activity:  Normal  Concentration:  Concentration: Good  Recall:  Good  Fund of Knowledge:  Good  Language:  Good  Akathisia:  No  Handed:  Right  AIMS (if indicated):     Assets:  Communication Skills Desire for Improvement Financial Resources/Insurance Housing Intimacy Resilience Social Support Talents/Skills Transportation Vocational/Educational  ADL's:  Intact  Cognition:  WNL  Sleep:        Assessment and Plan:     05/09/2022    9:12 AM 03/07/2022    9:11 AM 01/03/2022    1:28 PM 09/20/2021    9:13 AM 08/23/2021    1:17 PM  Depression screen PHQ 2/9  Decreased Interest '3 3  3 3  '$ Down, Depressed, Hopeless '3 3 3 2 3  '$ PHQ - 2 Score '6 6 3 5 6  '$ Altered sleeping 3 3 3  3 3  Tired, decreased energy '3 3 3 3 3  '$ Change in appetite '1 3 3 3 2  '$ Feeling bad or failure about yourself  '3 3 3 3 3  '$ Trouble concentrating '2 2 2 2 3  '$ Moving slowly or fidgety/restless 0 0 0 0 0  Suicidal thoughts 0 0 0 0 0  PHQ-9 Score '18 20 17 19 20  '$ Difficult doing work/chores Extremely dIfficult Extremely dIfficult Very difficult Very difficult Very difficult    Flowsheet Row Video Visit from 05/09/2022 in [facility] Admission (Discharged) from 04/24/2022 in [facility] Testing 45 from 04/17/2022  in [place] No Risk No Risk No Risk        The risk of un-intended pregnancy is low  based on the fact that pt reports she had a hysterectomy. Pt is aware that these meds carry a teratogenic risk. Pt will discuss plan of action if she does or plans to become pregnant in the future.  Status of current problems: ongoing depression and anxiety  Meds:  D/C Ritalin D/C Zoloft- decrease to '100mg'$  for 5 days while starting Effexor XR 37.'5mg'$  po qD  1. MDD (major depressive disorder), recurrent episode, moderate (HCC) - venlafaxine XR (EFFEXOR XR) 37.5 MG 24 hr capsule; Take 1 capsule (37.5 mg total) by mouth daily.  Dispense: 30 capsule; Refill: 0  2. Generalized anxiety disorder - venlafaxine XR (EFFEXOR XR) 37.5 MG 24 hr capsule; Take 1 capsule (37.5 mg total) by mouth daily.  Dispense: 30 capsule; Refill: 0  3. Insomnia due to other mental disorder     Labs: none    Therapy: brief supportive therapy provided.   Collaboration of Care: Other none  Patient/Guardian was advised Release of Information must be obtained prior to any record release in order to collaborate their care with an outside provider. Patient/Guardian was advised if they have not already done so to contact the registration department to sign all necessary forms in order for [place] to release information regarding their care.   Consent: Patient/Guardian gives verbal consent for treatment and assignment of benefits for services provided during this visit. Patient/Guardian expressed understanding and agreed to proceed.    Follow Up Instructions: Follow up in 2 weeks or sooner if needed    I discussed the assessment and treatment plan with the patient. The patient was provided an opportunity to ask questions and all were answered. The patient agreed with the plan and demonstrated an understanding of the instructions.   The patient was advised to call  back or seek an in-person evaluation if the symptoms worsen or if the condition fails to improve as anticipated.  I provided 25 minutes of non-face-to-face time during this encounter.   Charlcie Cradle, MD

## 2022-05-15 ENCOUNTER — Encounter: Payer: Self-pay | Admitting: Obstetrics and Gynecology

## 2022-05-15 ENCOUNTER — Ambulatory Visit: Payer: Medicare HMO

## 2022-05-15 ENCOUNTER — Other Ambulatory Visit: Payer: Self-pay | Admitting: Nurse Practitioner

## 2022-05-15 DIAGNOSIS — R300 Dysuria: Secondary | ICD-10-CM

## 2022-05-15 NOTE — Progress Notes (Signed)
I called Ms. Ator to check on how she was doing. She has dysuria about a week after her surgery. Her symptoms have not improved. Beckey Rutter, NP has ordered a urinalysis and urine C&S. I will recommend to Beckey Rutter to prescribe antibiotics for possible UTI.   We discussed the spot that really hurts her. I discussed that we can biopsy that area. I am happy to see her. She wants to be seen sooner and I offered her a [facility] appointment on 06/04/2022.    Fatou Dunnigan Gaetana Michaelis, MD

## 2022-05-15 NOTE — Progress Notes (Signed)
Spoke to patient. Complaining of dysuria. Will check UA tomorrow.

## 2022-05-16 ENCOUNTER — Inpatient Hospital Stay: Payer: Medicare HMO

## 2022-05-16 ENCOUNTER — Other Ambulatory Visit: Payer: Self-pay | Admitting: Nurse Practitioner

## 2022-05-16 DIAGNOSIS — R300 Dysuria: Secondary | ICD-10-CM

## 2022-05-16 DIAGNOSIS — N3000 Acute cystitis without hematuria: Secondary | ICD-10-CM

## 2022-05-16 DIAGNOSIS — N903 Dysplasia of vulva, unspecified: Secondary | ICD-10-CM | POA: Diagnosis not present

## 2022-05-16 DIAGNOSIS — Z79899 Other long term (current) drug therapy: Secondary | ICD-10-CM | POA: Diagnosis not present

## 2022-05-16 DIAGNOSIS — Z87412 Personal history of vulvar dysplasia: Secondary | ICD-10-CM | POA: Diagnosis not present

## 2022-05-16 LAB — URINALYSIS, COMPLETE (UACMP) WITH MICROSCOPIC
Bilirubin Urine: NEGATIVE
Glucose, UA: NEGATIVE mg/dL
Ketones, ur: NEGATIVE mg/dL
Nitrite: POSITIVE — AB
Protein, ur: 30 mg/dL — AB
RBC / HPF: >50 RBC/hpf — ABNORMAL HIGH (ref 0–5)
Specific Gravity, Urine: 1.018 (ref 1.005–1.030)
WBC, UA: >50 WBC/hpf — ABNORMAL HIGH (ref 0–5)
pH: 6 (ref 5.0–8.0)

## 2022-05-16 MED ORDER — NITROFURANTOIN MONOHYD MACRO 100 MG PO CAPS: 100.0000 mg | ORAL_CAPSULE | Freq: Two times a day (BID) | ORAL | 0 refills | Status: AC

## 2022-05-16 NOTE — Progress Notes (Signed)
UA consistent with infection. Start macrobid. Adjust treatment based on culture & sensitivity if needed. Left patient voicemail.

## 2022-05-17 DIAGNOSIS — G3184 Mild cognitive impairment, so stated: Secondary | ICD-10-CM | POA: Diagnosis not present

## 2022-05-18 LAB — URINE CULTURE: Culture: >=100000 — AB

## 2022-05-23 ENCOUNTER — Telehealth (HOSPITAL_BASED_OUTPATIENT_CLINIC_OR_DEPARTMENT_OTHER): Payer: Medicare HMO | Admitting: Psychiatry

## 2022-05-23 ENCOUNTER — Ambulatory Visit: Payer: Medicare HMO | Admitting: Obstetrics & Gynecology

## 2022-05-23 DIAGNOSIS — F5105 Insomnia due to other mental disorder: Secondary | ICD-10-CM | POA: Diagnosis not present

## 2022-05-23 DIAGNOSIS — F99 Mental disorder, not otherwise specified: Secondary | ICD-10-CM | POA: Diagnosis not present

## 2022-05-23 DIAGNOSIS — R202 Paresthesia of skin: Secondary | ICD-10-CM | POA: Diagnosis not present

## 2022-05-23 DIAGNOSIS — F331 Major depressive disorder, recurrent, moderate: Secondary | ICD-10-CM | POA: Diagnosis not present

## 2022-05-23 DIAGNOSIS — Z113 Encounter for screening for infections with a predominantly sexual mode of transmission: Secondary | ICD-10-CM | POA: Diagnosis not present

## 2022-05-23 DIAGNOSIS — F411 Generalized anxiety disorder: Secondary | ICD-10-CM

## 2022-05-23 DIAGNOSIS — G3184 Mild cognitive impairment, so stated: Secondary | ICD-10-CM | POA: Diagnosis not present

## 2022-05-23 MED ORDER — VENLAFAXINE HCL ER 75 MG PO CP24: 75.0000 mg | ORAL_CAPSULE | Freq: Every day | ORAL | 0 refills | Status: DC

## 2022-05-23 MED ORDER — LORAZEPAM 0.5 MG PO TABS: 0.5000 mg | ORAL_TABLET | Freq: Every day | ORAL | 0 refills | Status: DC | PRN

## 2022-05-23 NOTE — Progress Notes (Signed)
Virtual Visit via Video Note  I connected with Kathleen Everett on 05/23/22 at  2:00 PM EDT by a video enabled telemedicine application and verified that I am speaking with the correct person using two identifiers.  Location: Patient: home Provider: office   I discussed the limitations of evaluation and management by telemedicine and the availability of in person appointments. The patient expressed understanding and agreed to proceed.  History of Present Illness: "I don't know if the medicine is working". Kathleen Everett is feeling more agitated and irritable. She is repeating herself a lot. She has been crying and has felt like she wants to cry more than before. She has been on Effexor for 2 weeks now. She still feels like her concentration and comprehension remain poor. She is working with her neurologist. Her sleep is variable. Some nights she sleeps from 7-9 hrs but mostly she doesn't sleep well. She is tired when she lies down but her mind won't shut off. During the daytime her anxiety is ongoing and she is overwhelmed. She gives the example of wanting to get many chores done but not being able to do it. She doesn't  know how to shut her mind off.she has been feeling depressed every day. Over the last 2 weeks the depression is a little better but the irritability is overshadowing everything. Some days she wishes she could leave and live somewhere else. She denies SI/HI.    Observations/Objective: Psychiatric Specialty Exam: ROS  Last menstrual period 12/08/2006.There is no height or weight on file to calculate BMI.  General Appearance: Casual  Eye Contact:  Good  Speech:  Clear and Coherent and Normal Rate  Volume:  Normal  Mood:  Anxious, Depressed, and Irritable  Affect:  Full Range  Thought Process:  Goal Directed, Linear, and Descriptions of Associations: Intact  Orientation:  Full (Time, Place, and Person)  Thought Content:  WDL  Suicidal Thoughts:  No  Homicidal Thoughts:  No   Memory:  Immediate;   Good  Judgement:  Good  Insight:  Good  Psychomotor Activity:  Normal  Concentration:  Concentration: Good  Recall:  Good  Fund of Knowledge:  Good  Language:  Good  Akathisia:  No  Handed:  Right  AIMS (if indicated):     Assets:  Communication Skills Desire for Improvement Financial Resources/Insurance [place] Talents/Skills Transportation Vocational/Educational  ADL's:  Intact  Cognition:  WNL  Sleep:        Assessment and Plan:     05/23/2022    2:16 PM 05/09/2022    9:12 AM 03/07/2022    9:11 AM 01/03/2022    1:28 PM 09/20/2021    9:13 AM  Depression screen PHQ 2/9  Decreased Interest '3 3 3  3  '$ Down, Depressed, Hopeless '3 3 3 3 2  '$ PHQ - 2 Score '6 6 6 3 5  '$ Altered sleeping '2 3 3 3 3  '$ Tired, decreased energy '3 3 3 3 3  '$ Change in appetite 0 '1 3 3 3  '$ Feeling bad or failure about yourself  '3 3 3 3 3  '$ Trouble concentrating '3 2 2 2 2  '$ Moving slowly or fidgety/restless 0 0 0 0 0  Suicidal thoughts 0 0 0 0 0  PHQ-9 Score '17 18 20 17 19  '$ Difficult doing work/chores Extremely dIfficult Extremely dIfficult Extremely dIfficult Very difficult Very difficult    Flowsheet Row Video Visit from 05/23/2022 in [facility] Video Visit from  05/09/2022 in [facility] Admission (Discharged) from 04/24/2022 in [facility]  C-SSRS RISK CATEGORY No Risk No Risk No Risk         Status of current problems: ongoing depression and anxiety, increased irritability  Meds:  Start Ativan 0.'5mg'$  po qD prn anxiety- sent in 30 days with no refills. Pt understands this is a temporary medication.  Increase Effexor XR '75mg'$  po qD  If symptoms do not improve I may consider adding a mood stabilizer.  1. Generalized anxiety disorder - venlafaxine XR (EFFEXOR XR) 75 MG 24 hr capsule; Take 1 capsule (75 mg total) by mouth daily.   Dispense: 30 capsule; Refill: 0 - LORazepam (ATIVAN) 0.5 MG tablet; Take 1 tablet (0.5 mg total) by mouth daily as needed for anxiety.  Dispense: 30 tablet; Refill: 0  2. MDD (major depressive disorder), recurrent episode, moderate (HCC) - venlafaxine XR (EFFEXOR XR) 75 MG 24 hr capsule; Take 1 capsule (75 mg total) by mouth daily.  Dispense: 30 capsule; Refill: 0  3. Insomnia due to other mental disorder - LORazepam (ATIVAN) 0.5 MG tablet; Take 1 tablet (0.5 mg total) by mouth daily as needed for anxiety.  Dispense: 30 tablet; Refill: 0     Labs: none    Therapy: brief supportive therapy provided. Discussed psychosocial stressors in detail.     Collaboration of Care: Other none  Patient/Guardian was advised Release of Information must be obtained prior to any record release in order to collaborate their care with an outside provider. Patient/Guardian was advised if they have not already done so to contact the registration department to sign all necessary forms in order for [place] to release information regarding their care.   Consent: Patient/Guardian gives verbal consent for treatment and assignment of benefits for services provided during this visit. Patient/Guardian expressed understanding and agreed to proceed.      Follow Up Instructions: Follow up in 2-4 weeks or sooner if needed    I discussed the assessment and treatment plan with the patient. The patient was provided an opportunity to ask questions and all were answered. The patient agreed with the plan and demonstrated an understanding of the instructions.   The patient was advised to call back or seek an in-person evaluation if the symptoms worsen or if the condition fails to improve as anticipated.  I provided 20 minutes of non-face-to-face time during this encounter.   Charlcie Cradle, MD

## 2022-05-29 ENCOUNTER — Telehealth: Payer: Self-pay | Admitting: Nurse Practitioner

## 2022-05-29 ENCOUNTER — Ambulatory Visit: Payer: Medicare HMO

## 2022-05-29 DIAGNOSIS — N901 Moderate vulvar dysplasia: Secondary | ICD-10-CM

## 2022-05-29 NOTE — Telephone Encounter (Signed)
Called patient to follow up post antibiotic treatment. She does not have appointment with oncology t this time. Will need to get her scheduled at either [facility] or [facility] for post-op. Left vm for patient to return call.

## 2022-05-30 ENCOUNTER — Telehealth (HOSPITAL_COMMUNITY): Payer: Self-pay | Admitting: *Deleted

## 2022-05-30 NOTE — Telephone Encounter (Signed)
PA FOR LORAZEPAM 0.5 MG QD SUBMITTED TO [employer] VIA [employer].  PA CASE ID # [id number]  AWAITING DETERMINATION.

## 2022-05-30 NOTE — Telephone Encounter (Signed)
PA FOR LORAZEPAM 0.5 MG TABLETS APPROVED.  COVERAGE GOOD THROUGH 11/17/22.   PT NOTIFIED.

## 2022-05-31 ENCOUNTER — Encounter: Payer: Self-pay | Admitting: Obstetrics and Gynecology

## 2022-05-31 ENCOUNTER — Other Ambulatory Visit: Payer: Self-pay | Admitting: Dermatology

## 2022-05-31 DIAGNOSIS — L309 Dermatitis, unspecified: Secondary | ICD-10-CM

## 2022-06-03 ENCOUNTER — Encounter: Payer: Medicare HMO | Admitting: Speech Pathology

## 2022-06-03 ENCOUNTER — Ambulatory Visit: Payer: Medicare HMO

## 2022-06-03 ENCOUNTER — Ambulatory Visit: Payer: Medicare HMO | Admitting: Speech Pathology

## 2022-06-04 ENCOUNTER — Other Ambulatory Visit: Payer: Self-pay | Admitting: *Deleted

## 2022-06-04 ENCOUNTER — Inpatient Hospital Stay: Payer: Medicare HMO | Attending: Obstetrics and Gynecology

## 2022-06-04 DIAGNOSIS — N903 Dysplasia of vulva, unspecified: Secondary | ICD-10-CM | POA: Insufficient documentation

## 2022-06-04 DIAGNOSIS — Z79899 Other long term (current) drug therapy: Secondary | ICD-10-CM | POA: Diagnosis not present

## 2022-06-04 DIAGNOSIS — M5412 Radiculopathy, cervical region: Secondary | ICD-10-CM | POA: Diagnosis not present

## 2022-06-04 DIAGNOSIS — R300 Dysuria: Secondary | ICD-10-CM

## 2022-06-04 DIAGNOSIS — N390 Urinary tract infection, site not specified: Secondary | ICD-10-CM | POA: Insufficient documentation

## 2022-06-04 DIAGNOSIS — M5136 Other intervertebral disc degeneration, lumbar region: Secondary | ICD-10-CM | POA: Insufficient documentation

## 2022-06-04 DIAGNOSIS — M5442 Lumbago with sciatica, left side: Secondary | ICD-10-CM | POA: Diagnosis not present

## 2022-06-04 DIAGNOSIS — E538 Deficiency of other specified B group vitamins: Secondary | ICD-10-CM | POA: Insufficient documentation

## 2022-06-04 DIAGNOSIS — E559 Vitamin D deficiency, unspecified: Secondary | ICD-10-CM | POA: Insufficient documentation

## 2022-06-04 DIAGNOSIS — E7800 Pure hypercholesterolemia, unspecified: Secondary | ICD-10-CM | POA: Insufficient documentation

## 2022-06-04 DIAGNOSIS — M47816 Spondylosis without myelopathy or radiculopathy, lumbar region: Secondary | ICD-10-CM | POA: Diagnosis not present

## 2022-06-04 DIAGNOSIS — M5137 Other intervertebral disc degeneration, lumbosacral region: Secondary | ICD-10-CM | POA: Diagnosis not present

## 2022-06-04 DIAGNOSIS — G4730 Sleep apnea, unspecified: Secondary | ICD-10-CM | POA: Insufficient documentation

## 2022-06-04 DIAGNOSIS — R232 Flushing: Secondary | ICD-10-CM | POA: Insufficient documentation

## 2022-06-04 DIAGNOSIS — M5441 Lumbago with sciatica, right side: Secondary | ICD-10-CM | POA: Diagnosis not present

## 2022-06-04 DIAGNOSIS — Z5181 Encounter for therapeutic drug level monitoring: Secondary | ICD-10-CM | POA: Diagnosis not present

## 2022-06-04 DIAGNOSIS — M1611 Unilateral primary osteoarthritis, right hip: Secondary | ICD-10-CM | POA: Insufficient documentation

## 2022-06-04 DIAGNOSIS — K219 Gastro-esophageal reflux disease without esophagitis: Secondary | ICD-10-CM | POA: Insufficient documentation

## 2022-06-05 ENCOUNTER — Ambulatory Visit: Payer: Medicare HMO

## 2022-06-05 ENCOUNTER — Inpatient Hospital Stay: Payer: Medicare HMO

## 2022-06-05 ENCOUNTER — Ambulatory Visit: Payer: Medicare HMO | Admitting: Physical Therapy

## 2022-06-05 ENCOUNTER — Encounter: Payer: Medicare HMO | Admitting: Speech Pathology

## 2022-06-05 DIAGNOSIS — N390 Urinary tract infection, site not specified: Secondary | ICD-10-CM | POA: Diagnosis not present

## 2022-06-05 DIAGNOSIS — E7800 Pure hypercholesterolemia, unspecified: Secondary | ICD-10-CM | POA: Diagnosis not present

## 2022-06-05 DIAGNOSIS — R300 Dysuria: Secondary | ICD-10-CM

## 2022-06-05 DIAGNOSIS — E538 Deficiency of other specified B group vitamins: Secondary | ICD-10-CM | POA: Diagnosis not present

## 2022-06-05 DIAGNOSIS — G4730 Sleep apnea, unspecified: Secondary | ICD-10-CM | POA: Diagnosis not present

## 2022-06-05 DIAGNOSIS — M1611 Unilateral primary osteoarthritis, right hip: Secondary | ICD-10-CM | POA: Diagnosis not present

## 2022-06-05 DIAGNOSIS — K219 Gastro-esophageal reflux disease without esophagitis: Secondary | ICD-10-CM | POA: Diagnosis not present

## 2022-06-05 DIAGNOSIS — M5136 Other intervertebral disc degeneration, lumbar region: Secondary | ICD-10-CM | POA: Diagnosis not present

## 2022-06-05 DIAGNOSIS — E559 Vitamin D deficiency, unspecified: Secondary | ICD-10-CM | POA: Diagnosis not present

## 2022-06-05 DIAGNOSIS — Z79899 Other long term (current) drug therapy: Secondary | ICD-10-CM | POA: Diagnosis not present

## 2022-06-05 DIAGNOSIS — N903 Dysplasia of vulva, unspecified: Secondary | ICD-10-CM | POA: Diagnosis not present

## 2022-06-05 DIAGNOSIS — R232 Flushing: Secondary | ICD-10-CM | POA: Diagnosis not present

## 2022-06-05 LAB — URINALYSIS, COMPLETE (UACMP) WITH MICROSCOPIC
Bilirubin Urine: NEGATIVE
Glucose, UA: NEGATIVE mg/dL
Hgb urine dipstick: NEGATIVE
Ketones, ur: NEGATIVE mg/dL
Nitrite: NEGATIVE
Protein, ur: NEGATIVE mg/dL
Specific Gravity, Urine: 1.018 (ref 1.005–1.030)
pH: 5 (ref 5.0–8.0)

## 2022-06-06 LAB — URINE CULTURE: Culture: <10000 — AB

## 2022-06-11 ENCOUNTER — Encounter: Payer: Medicare HMO | Admitting: Speech Pathology

## 2022-06-11 ENCOUNTER — Ambulatory Visit: Payer: Medicare HMO | Admitting: Physical Therapy

## 2022-06-12 ENCOUNTER — Inpatient Hospital Stay (HOSPITAL_BASED_OUTPATIENT_CLINIC_OR_DEPARTMENT_OTHER): Payer: Medicare HMO | Admitting: Obstetrics and Gynecology

## 2022-06-12 VITALS — BP 137/71 | HR 82 | Temp 97.9°F | Ht 70.0 in | Wt 220.1 lb

## 2022-06-12 DIAGNOSIS — N9089 Other specified noninflammatory disorders of vulva and perineum: Secondary | ICD-10-CM

## 2022-06-12 DIAGNOSIS — M1611 Unilateral primary osteoarthritis, right hip: Secondary | ICD-10-CM | POA: Diagnosis not present

## 2022-06-12 DIAGNOSIS — R232 Flushing: Secondary | ICD-10-CM | POA: Diagnosis not present

## 2022-06-12 DIAGNOSIS — N3000 Acute cystitis without hematuria: Secondary | ICD-10-CM

## 2022-06-12 DIAGNOSIS — K219 Gastro-esophageal reflux disease without esophagitis: Secondary | ICD-10-CM | POA: Diagnosis not present

## 2022-06-12 DIAGNOSIS — E559 Vitamin D deficiency, unspecified: Secondary | ICD-10-CM | POA: Diagnosis not present

## 2022-06-12 DIAGNOSIS — N390 Urinary tract infection, site not specified: Secondary | ICD-10-CM | POA: Diagnosis not present

## 2022-06-12 DIAGNOSIS — N903 Dysplasia of vulva, unspecified: Secondary | ICD-10-CM

## 2022-06-12 DIAGNOSIS — E7800 Pure hypercholesterolemia, unspecified: Secondary | ICD-10-CM | POA: Diagnosis not present

## 2022-06-12 DIAGNOSIS — E538 Deficiency of other specified B group vitamins: Secondary | ICD-10-CM | POA: Diagnosis not present

## 2022-06-12 DIAGNOSIS — M5136 Other intervertebral disc degeneration, lumbar region: Secondary | ICD-10-CM | POA: Diagnosis not present

## 2022-06-12 NOTE — Progress Notes (Addendum)
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 Theora Gianotti on 04/24/22. She returns to clinic for concern to have a vulvar biopsy.   She was treated for a UTI but still has residual symptoms of discomfort by the vulvar area noted previously  Per Elmo [facility]t [place] it looks like she had a LEEP in 2008 by Dr. Georga Bora.   Gynecologic History:  She presented with pain and slow healing hole in LT labia minora; sx off and on for over a year. Has had neg yeast and BV cultures, neg HSV 2 testing. Pt has tried nystatin crm, estrace crm, coconut oil, and vaseline without full relief. She couldn't tolerate lotrisone cream.   03/04/2022 On exam noted to have anterior vulvar lesion that was biopsied. 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   Non-smoker, No abnormal Pap - last Pap 06/24/2019 NIML satisfactory with transformation zone. She doesn't recall a h/o abnormal Pap but thinks she had a LEEP in 2011.   On 04/24/22 she underwent EUA, Colpo of vulva, ablation with bovie, biopsy, and periurethral biopsy. Exam under anesthesia and colposcopy of the vulva after application of acetic acid revealed 20 x 0.8 mm acetowhite epithelium involving the vulvar tissue adjacent to the urethra. The vagina and cervix were normal. There were no lesion. Bimanual exam was negative for masses or nodularity. All visible vulvar lesions were ablated.   DIAGNOSIS:  A.  VULVA, INFERIOR; BIOPSY: 1:00 (0.7 x 0.2 x 0.2 cm) - HIGH-GRADE SQUAMOUS INTRAEPITHELIAL LESION (HSIL/VIN-2).   B.  PERI URETHRA; BIOPSY:  - HIGH-GRADE SQUAMOUS INTRAEPITHELIAL LESION (HSIL/VIN-2).  These areas were completely ablated.   She represented to clinic with  pain/irritation lower on the vulva adjacent the the Bartholin's gland duct.    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   Ear fullness 02/09/2022   Runny nose 09/30/2021   Dizziness 07/22/2021   Vitamin B12 deficiency    Vaginal irritation 03/03/2021   Soft tissue mass 03/03/2021   Hemorrhoids 12/23/2020   DDD (degenerative disc disease), lumbar 10/03/2020   Abscess of right hip 05/11/2020   Healthcare maintenance 01/02/2020   Anemia 01/02/2020   Status post total hip replacement, 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   Hyperglycemia 06/24/2019   Vaginal bleeding 09/30/2016   Primary osteoarthritis of right hip 01/17/2016   Chronic gastritis 07/18/2015   Depression, major, in remission ([place]) 05/01/2015   Generalized anxiety disorder 05/01/2015   Chronic tension-type headache, intractable 04/24/2015   Dysmenorrhea 04/24/2015   Endometriosis 04/24/2015   Hidradenitis 04/24/2015   Hot flashes 03/12/2015   Chest pain 02/15/2015   BMI 35.0-35.9,adult 10/16/2014   Cough 09/28/2014   Breast pain, right 09/08/2014   Abdominal pain 07/24/2014   GERD (gastroesophageal reflux disease) 04/24/2014   Ankle pain, right 04/24/2014   Chiari malformation type I 01/26/2014   Vitamin D deficiency 01/18/2014   Diverticulitis 01/10/2014   Hypercholesteremia 01/10/2014   Right hip pain 01/10/2014   Migraine headaches 01/10/2014   Obstructive sleep apnea 01/10/2014   Chronic back pain 12/13/2012   Mitral valve prolapse 12/13/2012    Past Medical History: Past Medical History:  Diagnosis Date   Abdominal pain 07/24/2014   Abscess of right hip 05/11/2020   Anemia 01/02/2020   Ankle pain, right 04/24/2014   Breast pain, right 09/08/2014   Chest pain 02/15/2015   Chiari malformation type I 01/26/2014   s/p sgy decompression   Chronic back pain 12/13/2012   Chronic gastritis  07/18/2015   Chronic tension-type headache, intractable 04/24/2015   DDD (degenerative disc disease), lumbar    Diverticulitis    Dysmenorrhea    Dysplastic nevus 10/09/2015   R abdomen - moderate, limited margins free.   Endometriosis    Generalized anxiety disorder 05/01/2015   Has previously seen psychiatry. Having some issues with what she feels is related to stress and anxiety   GERD (gastroesophageal reflux disease)    Hemorrhoids 12/23/2020   Hidradenitis    History of ovarian cyst    Hot flashes 03/12/2015   Hypercholesteremia 01/10/2014   Off lipitor. Elevated cholesterol. Labs discussed. Restart lipitor. Follow lipid panel and liver function tests.   Hyperglycemia 06/24/2019   Major depressive disorder 05/01/2015   Migraine headaches 01/10/2014   Has seen neurology. Topamax.   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 Level is 21.6    Past Surgical History: Past Surgical History:  Procedure Laterality Date   ABDOMINAL HYSTERECTOMY  03/27/07   laparoscopic   BREAST EXCISIONAL BIOPSY Left 2004   fibroadenoma   CESAREAN SECTION     1999/2004 twins   chiari decompression     COLONOSCOPY WITH PROPOFOL N/A 04/28/2015   Procedure: COLONOSCOPY WITH PROPOFOL;  Surgeon: Manya Silvas, MD;  Location: [facility] ENDOSCOPY;  Service: Endoscopy;  Laterality: N/A;   COLONOSCOPY WITH PROPOFOL N/A 03/19/2021   Procedure: COLONOSCOPY WITH PROPOFOL;  Surgeon: Lesly Rubenstein, MD;  Location: [facility] ENDOSCOPY;  Service: Endoscopy;   Laterality: N/A;   COLPOSCOPY N/A 04/24/2022   Procedure: COLPOSCOPY, ABLATION OF VULVAR TISSUE;  Surgeon: Gillis Ends, MD;  Location: [facility] ORS;  Service: Gynecology;  Laterality: N/A;   CYSTOSCOPY     bladder ulcers   ESOPHAGOGASTRODUODENOSCOPY N/A 04/28/2015   Procedure: ESOPHAGOGASTRODUODENOSCOPY (EGD);  Surgeon: Manya Silvas, MD;  Location: [facility] ENDOSCOPY;  Service: Endoscopy;  Laterality: N/A;   ESOPHAGOGASTRODUODENOSCOPY (EGD) WITH PROPOFOL N/A 03/19/2021   Procedure: ESOPHAGOGASTRODUODENOSCOPY (EGD) WITH PROPOFOL;  Surgeon: Lesly Rubenstein, MD;  Location: [facility] ENDOSCOPY;  Service: Endoscopy;  Laterality: N/A;   HERNIA REPAIR Right 2003   inguinal   INCISION AND DRAINAGE HIP Right 05/11/2020   Procedure: IRRIGATION AND DEBRIDEMENT HIP;  Surgeon: Hessie Knows, MD;  Location: [facility] ORS;  Service: Orthopedics;  Laterality: Right;   OVARIAN CYST REMOVAL  2001   right   TEMPOROMANDIBULAR JOINT SURGERY  2000   TOTAL HIP ARTHROPLASTY Right 12/28/2019   Procedure: TOTAL HIP ARTHROPLASTY ANTERIOR APPROACH;  Surgeon: Hessie Knows, MD;  Location: [facility] ORS;  Service: Orthopedics;  Laterality: Right;   TUBAL LIGATION     Bilateral   VULVA /PERINEUM BIOPSY  04/24/2022   Procedure: VULVAR BIOPSY;  Surgeon: Gillis Ends, MD;  Location: [facility] ORS;  Service: Gynecology;;   Past Gynecologic History:  Menarche: 14 Menstrual details: n/a History of Abnormal pap: as per HPI Last pap: 06/24/2019 NIML satisfactory with transformation zone.  Sexually active: yes  OB History:  OB History     Gravida  2   Para  2   Term  2   Preterm      AB      Living  3      SAB      IAB      Ectopic      Multiple  1   Live Births  3          Family History: Family History  Problem Relation Age of Onset   Hyperlipidemia Mother    Anxiety disorder Mother    Atrial fibrillation Mother    Hypertension Mother    Memory loss Mother    Anxiety disorder Father    Stroke  Father    Hyperlipidemia Father    Hypertension Father    Congestive Heart Failure Father    Depression Father    Hyperlipidemia Brother    Colon cancer Paternal Aunt    Dementia Paternal Aunt    Dementia Maternal Grandmother    Dementia Paternal Grandmother    ADD / ADHD Daughter    Breast cancer Neg Hx    Social History: Social History   Tobacco Use   Smoking status: Never   Smokeless tobacco: Never  Vaping Use   Vaping Use: Never used  Substance Use Topics   Alcohol use: Not Currently    Alcohol/week: 0.0 standard drinks of alcohol   Drug use: No    Allergies: Allergies  Allergen Reactions   Vancomycin Hives   Ceftin [Cefuroxime Axetil] Other (See Comments)    Upset stomach   Celexa [Citalopram Hydrobromide] Diarrhea    GI upset     Clindamycin/Lincomycin Rash   Penicillins Rash    Did it involve swelling of the face/tongue/throat, SOB, or low BP? No Did it involve sudden or severe rash/hives, skin peeling, or any reaction on the inside of your mouth or nose? Unknown Did you need to seek medical attention at a hospital or doctor's office? No When did it last happen? childhood reaction If all above answers are "NO", may proceed with cephalosporin use.    Sulfa Antibiotics Rash   Current Medications: Current Outpatient Medications on File Prior to Visit  Medication Sig Dispense Refill   acetaminophen (TYLENOL) 325 MG tablet Take 500 mg by mouth every 6 (six) hours as needed.     AIMOVIG 140 MG/ML SOAJ Inject 140 mg into the skin every 28 (twenty-eight) days.     atorvastatin (LIPITOR) 10 MG tablet Take 10 mg by mouth daily.     azelastine (ASTELIN) 0.1 % nasal spray Place 1 spray into both nostrils 2 (two) times daily. Use in each nostril as directed 30 mL 1   clobetasol (TEMOVATE) 0.05 % external solution Apply topically daily as needed.     cyanocobalamin (,VITAMIN B-12,) 1000 MCG/ML injection INJECT 1 ML INTRAMUSCULARLY ONCE EVERY MONTH 10 mL 0   diclofenac  sodium (VOLTAREN) 1 % GEL Apply 1 application topically 4 (four) times daily as needed (pain.).      diclofenac Sodium (VOLTAREN) 1 % GEL SMARTSIG:Gram(s) Topical 4 Times Daily PRN     esomeprazole (NEXIUM) 40 MG capsule Take 1 capsule by mouth  once daily 90 capsule 3   lactase (RA DAIRY AID) 3000 units tablet Take 9,000 Units by mouth 3 (three) times daily as needed (with dairy consumption).      lidocaine (XYLOCAINE) 2 % jelly Apply 1 application. topically as needed. 20 mL 0   LORazepam (ATIVAN) 0.5 MG tablet Take 1 tablet (0.5 mg total) by mouth daily as needed for anxiety. 30 tablet 0   methocarbamol (ROBAXIN) 500 MG tablet Take 500 mg by mouth 4 (four) times daily.     mupirocin ointment (BACTROBAN) 2 % Apply to healing wound TID. 22 g 1   nystatin cream (MYCOSTATIN) Apply topically 2 (two) times daily. 30 g 0   oxyCODONE-acetaminophen (PERCOCET) 10-325 MG tablet Take by mouth.     rizatriptan (MAXALT) 10 MG tablet Take 10 mg by mouth as needed for migraine. May repeat in 2 hours if needed     Semaglutide, 2 MG/DOSE, 8 MG/3ML SOPN Inject 2 mg as directed once a week. 9 mL 2   Semaglutide,0.25 or 0.'5MG'$ /DOS, (OZEMPIC, 0.25 OR 0.5 MG/DOSE,) 2 MG/1.5ML SOPN Inject 2 mg into the skin.     sertraline (ZOLOFT) 100 MG tablet Take 100 mg by mouth daily.     Simethicone (GAS RELIEF PO) Take 125 mg by mouth 2 (two) times daily as needed for flatulence. Anti gas     tacrolimus (PROTOPIC) 0.1 % ointment APPLY TOPICALLY TWO (2) (TWO) TIMES DAILY. 100 g 2   Ubrogepant (UBRELVY) 100 MG TABS Take 100 mg by mouth 2 (two) times daily as needed.     gabapentin (NEURONTIN) 400 MG capsule Take by mouth.     No current facility-administered medications on file prior to visit.    Review of Systems General: no complaints  HEENT: no complaints  Lungs: no complaints  Cardiac: no complaints  GI: no complaints  GU: UTI type symptoms   Musculoskeletal: no complaints  Extremities: no complaints  Skin: no  complaints  Neuro: no complaints  Endocrine: no complaints  Psych: no complaints      Objective:  Physical Examination:  Today's Vitals   06/12/22 1513  BP: 137/71  Pulse: 82  Temp: 97.9 F (36.6 C)  TempSrc: Tympanic  SpO2: 99%  Weight: 220 lb 1.6 oz (99.8 kg)  Height: '5\' 10"'$  (1.778 m)  PainSc: 5   PainLoc: Back    Body mass index is 31.58 kg/m.  ECOG Performance Status: 0 - Asymptomatic  GENERAL: Patient is a well appearing female in no acute distress HEENT:  PERRL, neck supple with midline trachea.  EXTREMITIES:  No peripheral edema.   NEURO:  Nonfocal. Well oriented.  Appropriate affect.  Pelvic: EGBUS: no gross lesions. Small opening below at 4 o'clock corresponds to probable Bartholin's gland duct. There is a small red area lateral to that she identified using a mirror that bothers her   Vulvar Biopsy The risks and benefits of the procedure were reviewed and informed consent obtained. Time out was performed. The patient received pre-procedure teaching and expressed understanding. The post-procedure instructions were reviewed with the patient and she expressed understanding. The patient does not have any barriers to learning. A small red lesion was visible on the left labia just lateral to the duct at 4 o'clock. The area was numbed with local 2% lidocaine, 1 cc. A biopsy forceps was used to obtained a biopsy.  Hemostasis was obtained with silver nitrate. She tolerated the procedure well.          Lab Review  No labs on site today.  06/05/2022 <10,000 COLONIES/mL INSIGNIFICANT GROWTH  Performed at [facility] [address] [place], [place] [address]    Radiologic Imaging: N/a    Assessment:  Kerington Hildebrant is a 51 y.o. 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 Treated area is healing well.   Vulvar irritation persists and on exam  today there is a small depression at 4 o'clock, but no evidence of sinus tract of dysplasia on colposcopy.  UTI, persistent symptoms, repeat culture reassuring but not negative  Medical co-morbidities complicating care: Chiari malformation type I, Sleep apnea, hyperglycemia, prior laparotomy Plan:   Problem List Items Addressed This Visit       Genitourinary   Vulvar dysplasia - Primary   Follow up biopsy results and symptoms. If persistent symptoms and negative pathology refer to benign gynecology.    Repeat Pap in 06/2022  We discussed post UTI symptoms. Recommend hydration, cranberry juice, avoid irritants. If persistent symptoms we can repeat the culture.   Beckey Rutter, DNP, AGNP-C [place] at [facility] [phone] (clinic)  I personally had a face to face interaction and evaluated the patient jointly with the NP, Ms. Beckey Rutter.  I have reviewed her history and available records and have performed the key portions of the physical exam including pelvic exam with my findings confirming those documented above by the APP.  I have discussed the case with the APP and the patient.  I agree with the above documentation, assessment and plan which was fully formulated by me.  Counseling was completed by me.   I personally saw the patient and performed a substantive portion of this encounter in conjunction with the listed APP as documented above.  Tyri Elmore Gaetana Michaelis, MD

## 2022-06-12 NOTE — Progress Notes (Signed)
Burning/painful urination since surgery.

## 2022-06-13 ENCOUNTER — Other Ambulatory Visit: Payer: Self-pay | Admitting: Nurse Practitioner

## 2022-06-13 ENCOUNTER — Telehealth (HOSPITAL_BASED_OUTPATIENT_CLINIC_OR_DEPARTMENT_OTHER): Payer: Medicare HMO | Admitting: Psychiatry

## 2022-06-13 DIAGNOSIS — F99 Mental disorder, not otherwise specified: Secondary | ICD-10-CM

## 2022-06-13 DIAGNOSIS — N3000 Acute cystitis without hematuria: Secondary | ICD-10-CM

## 2022-06-13 DIAGNOSIS — F331 Major depressive disorder, recurrent, moderate: Secondary | ICD-10-CM | POA: Diagnosis not present

## 2022-06-13 DIAGNOSIS — F5105 Insomnia due to other mental disorder: Secondary | ICD-10-CM | POA: Diagnosis not present

## 2022-06-13 DIAGNOSIS — F411 Generalized anxiety disorder: Secondary | ICD-10-CM

## 2022-06-13 MED ORDER — LITHIUM CARBONATE ER 300 MG PO TBCR: 300.0000 mg | EXTENDED_RELEASE_TABLET | Freq: Every day | ORAL | 0 refills | Status: DC

## 2022-06-13 NOTE — Progress Notes (Signed)
Virtual Visit via Video Note  I connected with Kathleen Everett on 06/13/22 at 11:00 AM EDT by   a video enabled telemedicine application and verified that I am speaking with the correct person using two identifiers.  Location: Patient: home Provider: office   I discussed the limitations of evaluation and management by telemedicine and the availability of in person appointments. The patient expressed understanding and agreed to proceed.  History of Present Illness: Things have been "awful". Kathleen Everett felt out of control with Effexor. She could not stop crying all the time. She stopped Effexor and stared back on Zoloft 1 week ago. The Ativan did not help. Over the last few days her crying spells have decreased. Her husband is drinking more and feels that he is not spending as much with her. It makes her worry because his father is an alcoholic. She has ongoing feelings of worthlessness and anhedonia. Her sleep is variable. Some nights she sleeps 2 hours and other nights 5 hrs. Her energy, appetite and concentration are poor. She can't stay focused or complete tasks. She denies passive thoughts of death and denies SI/HI.    Observations/Objective: Psychiatric Specialty Exam: ROS  Last menstrual period 12/08/2006.There is no height or weight on file to calculate BMI.  General Appearance: Casual and Neat  Eye Contact:  Good  Speech:  Clear and Coherent and Normal Rate  Volume:  Normal  Mood:  Anxious and Depressed  Affect:  Congruent  Thought Process:  Goal Directed, Linear, and Descriptions of Associations: Intact  Orientation:  Full (Time, Place, and Person)  Thought Content:  Logical  Suicidal Thoughts:  No  Homicidal Thoughts:  No  Memory:  Immediate;   Good  Judgement:  Good  Insight:  Good  Psychomotor Activity:  Normal  Concentration:  Concentration: Good  Recall:  Good  Fund of Knowledge:  Good  Language:  Good  Akathisia:  No  Handed:  Right  AIMS (if indicated):      Assets:  Communication Skills Desire for Improvement Financial Resources/Insurance [place] Talents/Skills Transportation Vocational/Educational  ADL's:  Intact  Cognition:  WNL  Sleep:        Assessment and Plan:     06/13/2022   11:11 AM 05/23/2022    2:16 PM 05/09/2022    9:12 AM 03/07/2022    9:11 AM 01/03/2022    1:28 PM  Depression screen PHQ 2/9  Decreased Interest '3 3 3 3   '$ Down, Depressed, Hopeless '3 3 3 3 3  '$ PHQ - 2 Score '6 6 6 6 3  '$ Altered sleeping '2 2 3 3 3  '$ Tired, decreased energy '3 3 3 3 3  '$ Change in appetite 2 0 '1 3 3  '$ Feeling bad or failure about yourself  '3 3 3 3 3  '$ Trouble concentrating '3 3 2 2 2  '$ Moving slowly or fidgety/restless 0 0 0 0 0  Suicidal thoughts 0 0 0 0 0  PHQ-9 Score '19 17 18 20 17  '$ Difficult doing work/chores Extremely dIfficult Extremely dIfficult Extremely dIfficult Extremely dIfficult Very difficult    Flowsheet Row Video Visit from 06/13/2022 in [facility] Video Visit from 05/23/2022 in [facility] Video Visit from 05/09/2022 in [facility]  C-SSRS RISK CATEGORY No Risk No Risk No Risk           Status of current problems: worsening depression  Meds: Start Lithobid '300mg'$  po qD  for depression 1. MDD (major depressive disorder), recurrent episode, moderate ([facility]CC) - lithium carbonate (LITHOBID) 300 MG CR tablet; Take 1 tablet (300 mg total) by mouth at bedtime.  Dispense: 30 tablet; Refill: 0  2. Generalized anxiety disorder  3. Insomnia due to other mental disorder  Continue Zoloft '100mg'$  po qD Ativan 0.'5mg'$  po qD prn   Labs: none today    Therapy: brief supportive therapy provided.   Collaboration of Care: Other none today  Patient/Guardian was advised Release of Information must be obtained prior to any record release in order to collaborate their care with an outside  provider. Patient/Guardian was advised if they have not already done so to contact the registration department to sign all necessary forms in order for [place] to release information regarding their care.   Consent: Patient/Guardian gives verbal consent for treatment and assignment of benefits for services provided during this visit. Patient/Guardian expressed understanding and agreed to proceed.       Follow Up Instructions: Follow up in 1 week or sooner if needed    I discussed the assessment and treatment plan with the patient. The patient was provided an opportunity to ask questions and all were answered. The patient agreed with the plan and demonstrated an understanding of the instructions.   The patient was advised to call back or seek an in-person evaluation if the symptoms worsen or if the condition fails to improve as anticipated.  I provided 20 minutes of non-face-to-face time during this encounter.   Charlcie Cradle, MD

## 2022-06-14 ENCOUNTER — Ambulatory Visit: Payer: Medicare HMO | Admitting: Physical Therapy

## 2022-06-14 ENCOUNTER — Encounter: Payer: Medicare HMO | Admitting: Speech Pathology

## 2022-06-14 LAB — SURGICAL PATHOLOGY

## 2022-06-18 ENCOUNTER — Encounter: Payer: Medicare HMO | Admitting: Speech Pathology

## 2022-06-18 ENCOUNTER — Ambulatory Visit: Payer: Medicare HMO | Admitting: Physical Therapy

## 2022-06-19 ENCOUNTER — Encounter: Payer: Medicare HMO | Admitting: Speech Pathology

## 2022-06-19 ENCOUNTER — Ambulatory Visit: Payer: Medicare HMO | Admitting: Physical Therapy

## 2022-06-20 ENCOUNTER — Telehealth: Payer: Self-pay | Admitting: Nurse Practitioner

## 2022-06-20 ENCOUNTER — Encounter: Payer: Medicare HMO | Admitting: Speech Pathology

## 2022-06-20 ENCOUNTER — Ambulatory Visit: Payer: Medicare HMO | Admitting: Physical Therapy

## 2022-06-20 ENCOUNTER — Telehealth (HOSPITAL_COMMUNITY): Payer: Medicare HMO | Admitting: Psychiatry

## 2022-06-20 DIAGNOSIS — R300 Dysuria: Secondary | ICD-10-CM

## 2022-06-20 DIAGNOSIS — H168 Other keratitis: Secondary | ICD-10-CM | POA: Diagnosis not present

## 2022-06-21 ENCOUNTER — Ambulatory Visit: Payer: Medicare HMO

## 2022-06-21 ENCOUNTER — Encounter: Payer: Medicare HMO | Admitting: Speech Pathology

## 2022-06-24 NOTE — Telephone Encounter (Signed)
Spoke to patient. Persistent urinary symptoms. Consulted with urology who recommends screening for atypicals. Lab appointment scheduled for tomorrow.

## 2022-06-25 ENCOUNTER — Encounter: Payer: Medicare HMO | Admitting: Speech Pathology

## 2022-06-25 ENCOUNTER — Other Ambulatory Visit: Payer: Self-pay

## 2022-06-25 ENCOUNTER — Ambulatory Visit: Payer: Medicare HMO

## 2022-06-25 ENCOUNTER — Inpatient Hospital Stay: Payer: Medicare HMO | Attending: Obstetrics and Gynecology

## 2022-06-25 DIAGNOSIS — R300 Dysuria: Secondary | ICD-10-CM

## 2022-06-25 DIAGNOSIS — N3000 Acute cystitis without hematuria: Secondary | ICD-10-CM

## 2022-06-25 LAB — URINALYSIS, COMPLETE (UACMP) WITH MICROSCOPIC
Bacteria, UA: NONE SEEN
Bilirubin Urine: NEGATIVE
Glucose, UA: NEGATIVE mg/dL
Hgb urine dipstick: NEGATIVE
Ketones, ur: NEGATIVE mg/dL
Leukocytes,Ua: NEGATIVE
Nitrite: NEGATIVE
Protein, ur: NEGATIVE mg/dL
Specific Gravity, Urine: 1.018 (ref 1.005–1.030)
pH: 5 (ref 5.0–8.0)

## 2022-06-26 ENCOUNTER — Ambulatory Visit: Payer: Medicare HMO | Admitting: Physical Therapy

## 2022-06-26 ENCOUNTER — Encounter: Payer: Medicare HMO | Admitting: Speech Pathology

## 2022-06-27 ENCOUNTER — Ambulatory Visit: Payer: Medicare HMO | Admitting: Physical Therapy

## 2022-06-27 ENCOUNTER — Encounter: Payer: Medicare HMO | Admitting: Speech Pathology

## 2022-06-27 ENCOUNTER — Telehealth (HOSPITAL_BASED_OUTPATIENT_CLINIC_OR_DEPARTMENT_OTHER): Payer: Medicare HMO | Admitting: Psychiatry

## 2022-06-27 DIAGNOSIS — F411 Generalized anxiety disorder: Secondary | ICD-10-CM

## 2022-06-27 DIAGNOSIS — F5105 Insomnia due to other mental disorder: Secondary | ICD-10-CM | POA: Diagnosis not present

## 2022-06-27 DIAGNOSIS — F99 Mental disorder, not otherwise specified: Secondary | ICD-10-CM

## 2022-06-27 DIAGNOSIS — F331 Major depressive disorder, recurrent, moderate: Secondary | ICD-10-CM | POA: Diagnosis not present

## 2022-06-27 LAB — URINE CULTURE: Culture: NO GROWTH

## 2022-06-27 MED ORDER — SERTRALINE HCL 100 MG PO TABS: 100.0000 mg | ORAL_TABLET | Freq: Every day | ORAL | 0 refills | Status: DC

## 2022-06-27 MED ORDER — LORAZEPAM 0.5 MG PO TABS: 0.5000 mg | ORAL_TABLET | Freq: Every day | ORAL | 0 refills | Status: DC | PRN

## 2022-06-27 MED ORDER — LITHIUM CARBONATE ER 300 MG PO TBCR: 300.0000 mg | EXTENDED_RELEASE_TABLET | Freq: Every day | ORAL | 0 refills | Status: DC

## 2022-06-27 NOTE — Progress Notes (Signed)
Virtual Visit via Video Note  I connected with Kathleen Everett on 06/27/22 at  2:45 PM EDT by   a video enabled telemedicine application and verified that I am speaking with the correct person using two identifiers.  Location: Patient: home Provider: office   I discussed the limitations of evaluation and management by telemedicine and the availability of in person appointments. The patient expressed understanding and agreed to proceed.  History of Present Illness: Kathleen Everett has been taking Lithium for about 4 days now. She denies any worsening of her depression. She denies SI/HI. She denies any headaches or other SE.  She was started on Topamax by her pain doctor and has been having stomach upset. Kathleen Everett has ongoing fatigue and low motivation. She can't recall but thinks she might have taken it before. Kathleen Everett wants to continue it. She feels her anxiety is better since restarting Zoloft. She has not taken Ativan in about 1 week.    Observations/Objective: Psychiatric Specialty Exam: ROS  Last menstrual period 12/08/2006.There is no height or weight on file to calculate BMI.  General Appearance: Casual and Fairly Groomed  Eye Contact:  Good  Speech:  Clear and Coherent and Normal Rate  Volume:  Normal  Mood:  Depressed  Affect:  Congruent  Thought Process:  Goal Directed, Linear, and Descriptions of Associations: Intact  Orientation:  Full (Time, Place, and Person)  Thought Content:  Logical  Suicidal Thoughts:  No  Homicidal Thoughts:  No  Memory:  Immediate;   Good  Judgement:  Good  Insight:  Good  Psychomotor Activity:  Normal  Concentration:  Concentration: Good  Recall:  Good  Fund of Knowledge:  Good  Language:  Good  Akathisia:  No  Handed:  Right  AIMS (if indicated):     Assets:  Communication Skills Desire for Improvement Financial Resources/Insurance Housing Intimacy Leisure Time Resilience Social  Support Talents/Skills Transportation Vocational/Educational  ADL's:  Intact  Cognition:  WNL  Sleep:        Assessment and Plan:     06/13/2022   11:11 AM 05/23/2022    2:16 PM 05/09/2022    9:12 AM 03/07/2022    9:11 AM 01/03/2022    1:28 PM  Depression screen PHQ 2/9  Decreased Interest '3 3 3 3   '$ Down, Depressed, Hopeless '3 3 3 3 3  '$ PHQ - 2 Score '6 6 6 6 3  '$ Altered sleeping '2 2 3 3 3  '$ Tired, decreased energy '3 3 3 3 3  '$ Change in appetite 2 0 '1 3 3  '$ Feeling bad or failure about yourself  '3 3 3 3 3  '$ Trouble concentrating '3 3 2 2 2  '$ Moving slowly or fidgety/restless 0 0 0 0 0  Suicidal thoughts 0 0 0 0 0  PHQ-9 Score '19 17 18 20 17  '$ Difficult doing work/chores Extremely dIfficult Extremely dIfficult Extremely dIfficult Extremely dIfficult Very difficult    Flowsheet Row Video Visit from 06/13/2022 in [facility] Video Visit from 05/23/2022 in [facility] Video Visit from 05/09/2022 in [facility] No Risk No Risk No Risk        Pt is aware that these meds carry a teratogenic risk. Pt will discuss plan of action if she does or plans to become pregnant in the future.  Status of current problems: ongoing depression  Meds:  1. MDD (major depressive disorder), recurrent episode, moderate (  [place]) - lithium carbonate (LITHOBID) 300 MG CR tablet; Take 1 tablet (300 mg total) by mouth at bedtime.  Dispense: 90 tablet; Refill: 0  2. Generalized anxiety disorder - LORazepam (ATIVAN) 0.5 MG tablet; Take 1 tablet (0.5 mg total) by mouth daily as needed for anxiety.  Dispense: 30 tablet; Refill: 0  3. Insomnia due to other mental disorder - LORazepam (ATIVAN) 0.5 MG tablet; Take 1 tablet (0.5 mg total) by mouth daily as needed for anxiety.  Dispense: 30 tablet; Refill: 0     Labs: none today. She has a physical with her PCP next month and will  gets labs drawn then.     Therapy: brief supportive therapy provided.     Collaboration of Care: Other none  Patient/Guardian was advised Release of Information must be obtained prior to any record release in order to collaborate their care with an outside provider. Patient/Guardian was advised if they have not already done so to contact the registration department to sign all necessary forms in order for [place] to release information regarding their care.   Consent: Patient/Guardian gives verbal consent for treatment and assignment of benefits for services provided during this visit. Patient/Guardian expressed understanding and agreed to proceed.     Follow Up Instructions: Follow up in 1-2 months or sooner if needed    I discussed the assessment and treatment plan with the patient. The patient was provided an opportunity to ask questions and all were answered. The patient agreed with the plan and demonstrated an understanding of the instructions.   The patient was advised to call back or seek an in-person evaluation if the symptoms worsen or if the condition fails to improve as anticipated.  I provided 9 minutes of non-face-to-face time during this encounter.   Charlcie Cradle, MD

## 2022-06-28 ENCOUNTER — Ambulatory Visit: Payer: Medicare HMO

## 2022-06-28 ENCOUNTER — Other Ambulatory Visit: Payer: Self-pay | Admitting: Internal Medicine

## 2022-06-28 ENCOUNTER — Encounter: Payer: Medicare HMO | Admitting: Speech Pathology

## 2022-06-28 DIAGNOSIS — Z1231 Encounter for screening mammogram for malignant neoplasm of breast: Secondary | ICD-10-CM

## 2022-06-28 DIAGNOSIS — H168 Other keratitis: Secondary | ICD-10-CM | POA: Diagnosis not present

## 2022-07-01 LAB — MISC LABCORP TEST (SEND OUT): Labcorp test code: 86884

## 2022-07-03 ENCOUNTER — Encounter: Payer: Medicare HMO | Admitting: Speech Pathology

## 2022-07-03 ENCOUNTER — Ambulatory Visit: Payer: Medicare HMO | Admitting: Physical Therapy

## 2022-07-04 ENCOUNTER — Encounter: Payer: Medicare HMO | Admitting: Speech Pathology

## 2022-07-04 ENCOUNTER — Ambulatory Visit: Payer: Medicare HMO

## 2022-07-04 ENCOUNTER — Telehealth: Payer: Self-pay | Admitting: Nurse Practitioner

## 2022-07-04 DIAGNOSIS — N9089 Other specified noninflammatory disorders of vulva and perineum: Secondary | ICD-10-CM

## 2022-07-04 NOTE — Telephone Encounter (Signed)
Spoke to patient by phone. UA, culture, and atypicals were all negative. She has persistent dysuria, vulvar irritation. Also complains of spotting after intercourse. Will review with Dr. Theora Gianotti. Patient is post supracervical hysterectomy and takes vaginal estrace cream.

## 2022-07-05 ENCOUNTER — Ambulatory Visit: Payer: Medicare HMO | Admitting: Physical Therapy

## 2022-07-05 ENCOUNTER — Encounter: Payer: Medicare HMO | Admitting: Speech Pathology

## 2022-07-05 DIAGNOSIS — H5213 Myopia, bilateral: Secondary | ICD-10-CM | POA: Diagnosis not present

## 2022-07-08 ENCOUNTER — Ambulatory Visit: Payer: Medicare HMO

## 2022-07-08 ENCOUNTER — Encounter: Payer: Medicare HMO | Admitting: Speech Pathology

## 2022-07-09 ENCOUNTER — Ambulatory Visit: Payer: Medicare HMO

## 2022-07-09 ENCOUNTER — Encounter: Payer: Medicare HMO | Admitting: Speech Pathology

## 2022-07-10 ENCOUNTER — Ambulatory Visit: Payer: Medicare HMO | Admitting: Physical Therapy

## 2022-07-10 ENCOUNTER — Encounter: Payer: Medicare HMO | Admitting: Speech Pathology

## 2022-07-11 ENCOUNTER — Ambulatory Visit: Payer: Medicare HMO | Admitting: Physical Therapy

## 2022-07-11 ENCOUNTER — Encounter: Payer: Medicare HMO | Admitting: Speech Pathology

## 2022-07-15 ENCOUNTER — Encounter: Payer: Medicare HMO | Admitting: Speech Pathology

## 2022-07-15 ENCOUNTER — Ambulatory Visit: Payer: Medicare HMO

## 2022-07-16 ENCOUNTER — Other Ambulatory Visit: Payer: Medicare HMO

## 2022-07-18 ENCOUNTER — Ambulatory Visit: Payer: Medicare HMO | Admitting: Physical Therapy

## 2022-07-18 ENCOUNTER — Encounter: Payer: Medicare HMO | Admitting: Speech Pathology

## 2022-07-18 DIAGNOSIS — G43019 Migraine without aura, intractable, without status migrainosus: Secondary | ICD-10-CM | POA: Diagnosis not present

## 2022-07-18 DIAGNOSIS — R413 Other amnesia: Secondary | ICD-10-CM | POA: Diagnosis not present

## 2022-07-19 ENCOUNTER — Ambulatory Visit (INDEPENDENT_AMBULATORY_CARE_PROVIDER_SITE_OTHER): Payer: Medicare HMO | Admitting: Internal Medicine

## 2022-07-19 ENCOUNTER — Ambulatory Visit (INDEPENDENT_AMBULATORY_CARE_PROVIDER_SITE_OTHER): Payer: Medicare HMO

## 2022-07-19 ENCOUNTER — Encounter: Payer: Self-pay | Admitting: Internal Medicine

## 2022-07-19 ENCOUNTER — Telehealth: Payer: Self-pay

## 2022-07-19 VITALS — BP 142/84 | HR 72 | Temp 97.8°F | Ht 70.0 in | Wt 214.6 lb

## 2022-07-19 DIAGNOSIS — G935 Compression of brain: Secondary | ICD-10-CM | POA: Diagnosis not present

## 2022-07-19 DIAGNOSIS — G43019 Migraine without aura, intractable, without status migrainosus: Secondary | ICD-10-CM | POA: Diagnosis not present

## 2022-07-19 DIAGNOSIS — Q0700 Arnold-Chiari syndrome without spina bifida or hydrocephalus: Secondary | ICD-10-CM

## 2022-07-19 DIAGNOSIS — E7800 Pure hypercholesterolemia, unspecified: Secondary | ICD-10-CM | POA: Diagnosis not present

## 2022-07-19 DIAGNOSIS — R202 Paresthesia of skin: Secondary | ICD-10-CM | POA: Diagnosis not present

## 2022-07-19 DIAGNOSIS — R109 Unspecified abdominal pain: Secondary | ICD-10-CM | POA: Diagnosis not present

## 2022-07-19 DIAGNOSIS — G8929 Other chronic pain: Secondary | ICD-10-CM

## 2022-07-19 DIAGNOSIS — F411 Generalized anxiety disorder: Secondary | ICD-10-CM | POA: Diagnosis not present

## 2022-07-19 DIAGNOSIS — R2689 Other abnormalities of gait and mobility: Secondary | ICD-10-CM | POA: Diagnosis not present

## 2022-07-19 DIAGNOSIS — R140 Abdominal distension (gaseous): Secondary | ICD-10-CM | POA: Insufficient documentation

## 2022-07-19 DIAGNOSIS — G3184 Mild cognitive impairment, so stated: Secondary | ICD-10-CM | POA: Diagnosis not present

## 2022-07-19 DIAGNOSIS — K5900 Constipation, unspecified: Secondary | ICD-10-CM | POA: Diagnosis not present

## 2022-07-19 DIAGNOSIS — Z0000 Encounter for general adult medical examination without abnormal findings: Secondary | ICD-10-CM | POA: Diagnosis not present

## 2022-07-19 DIAGNOSIS — N63 Unspecified lump in unspecified breast: Secondary | ICD-10-CM | POA: Diagnosis not present

## 2022-07-19 DIAGNOSIS — K219 Gastro-esophageal reflux disease without esophagitis: Secondary | ICD-10-CM

## 2022-07-19 DIAGNOSIS — R739 Hyperglycemia, unspecified: Secondary | ICD-10-CM

## 2022-07-19 DIAGNOSIS — M545 Low back pain, unspecified: Secondary | ICD-10-CM

## 2022-07-19 DIAGNOSIS — R251 Tremor, unspecified: Secondary | ICD-10-CM | POA: Diagnosis not present

## 2022-07-19 DIAGNOSIS — F325 Major depressive disorder, single episode, in full remission: Secondary | ICD-10-CM | POA: Diagnosis not present

## 2022-07-19 DIAGNOSIS — R1084 Generalized abdominal pain: Secondary | ICD-10-CM

## 2022-07-19 DIAGNOSIS — R21 Rash and other nonspecific skin eruption: Secondary | ICD-10-CM

## 2022-07-19 LAB — HEMOGLOBIN A1C: Hgb A1c MFr Bld: 5.4 % (ref 4.6–6.5)

## 2022-07-19 LAB — BASIC METABOLIC PANEL
BUN: 12 mg/dL (ref 6–23)
CO2: 31 mEq/L (ref 19–32)
Calcium: 9.2 mg/dL (ref 8.4–10.5)
Chloride: 103 mEq/L (ref 96–112)
Creatinine, Ser: 0.72 mg/dL (ref 0.40–1.20)
GFR: 98.43 mL/min (ref >60.00)
Glucose, Bld: 78 mg/dL (ref 70–99)
Potassium: 4.2 mEq/L (ref 3.5–5.1)
Sodium: 139 mEq/L (ref 135–145)

## 2022-07-19 LAB — LIPID PANEL
Cholesterol: 285 mg/dL — ABNORMAL HIGH (ref 0–200)
HDL: 45.2 mg/dL (ref >39.00)
LDL Cholesterol: 211 mg/dL — ABNORMAL HIGH (ref 0–99)
NonHDL: 240.04
Total CHOL/HDL Ratio: 6
Triglycerides: 147 mg/dL (ref 0.0–149.0)
VLDL: 29.4 mg/dL (ref 0.0–40.0)

## 2022-07-19 LAB — HEPATIC FUNCTION PANEL
ALT: 11 U/L (ref 0–35)
AST: 13 U/L (ref 0–37)
Albumin: 4.3 g/dL (ref 3.5–5.2)
Alkaline Phosphatase: 65 U/L (ref 39–117)
Bilirubin, Direct: 0.1 mg/dL (ref 0.0–0.3)
Total Bilirubin: 0.4 mg/dL (ref 0.2–1.2)
Total Protein: 6.4 g/dL (ref 6.0–8.3)

## 2022-07-19 LAB — CBC WITH DIFFERENTIAL/PLATELET
Basophils Absolute: 0 10*3/uL (ref 0.0–0.1)
Basophils Relative: 0.3 % (ref 0.0–3.0)
Eosinophils Absolute: 0.1 10*3/uL (ref 0.0–0.7)
Eosinophils Relative: 1.2 % (ref 0.0–5.0)
HCT: 38.9 % (ref 36.0–46.0)
Hemoglobin: 13.3 g/dL (ref 12.0–15.0)
Lymphocytes Relative: 22.1 % (ref 12.0–46.0)
Lymphs Abs: 1.2 10*3/uL (ref 0.7–4.0)
MCHC: 34.2 g/dL (ref 30.0–36.0)
MCV: 87.4 fl (ref 78.0–100.0)
Monocytes Absolute: 0.3 10*3/uL (ref 0.1–1.0)
Monocytes Relative: 6.1 % (ref 3.0–12.0)
Neutro Abs: 3.9 10*3/uL (ref 1.4–7.7)
Neutrophils Relative %: 70.3 % (ref 43.0–77.0)
Platelets: 185 10*3/uL (ref 150.0–400.0)
RBC: 4.45 Mil/uL (ref 3.87–5.11)
RDW: 13.4 % (ref 11.5–15.5)
WBC: 5.6 10*3/uL (ref 4.0–10.5)

## 2022-07-19 NOTE — Telephone Encounter (Signed)
I called patient to try to let her know that when mammogram was changed to diagnostic with [place] that date had to change as well. Has been rescheduled for 08/07/22 at 9:40 am. When I called patient VM was full unable to leave this message.

## 2022-07-19 NOTE — Progress Notes (Unsigned)
Patient ID: Kathleen Everett, female   DOB: 07/26/1971, 51 y.o.   MRN: [id number]   Subjective:    Patient ID: Kathleen Everett, female    DOB: 11/28/1970, 51 y.o.   MRN: [id number]  This visit occurred during the SARS-CoV-2 public health emergency.  Safety protocols were in place, including screening questions prior to the visit, additional usage of staff PPE, and extensive cleaning of exam room while observing appropriate contact time as indicated for disinfecting solutions.   Patient here for  Chief Complaint  Patient presents with   Annual Exam   .   HPI    Past Medical History:  Diagnosis Date   Abdominal pain 07/24/2014   Abscess of right hip 05/11/2020   Anemia 01/02/2020   Ankle pain, right 04/24/2014   Breast pain, right 09/08/2014   Chest pain 02/15/2015   Chiari malformation type I 01/26/2014   s/p sgy decompression   Chronic back pain 12/13/2012   Chronic gastritis 07/18/2015   Chronic tension-type headache, intractable 04/24/2015   DDD (degenerative disc disease), lumbar    Diverticulitis    Dysmenorrhea    Dysplastic nevus 10/09/2015   R abdomen - moderate, limited margins free.   Endometriosis    Generalized anxiety disorder 05/01/2015   Has previously seen psychiatry. Having some issues with what she feels is related to stress and anxiety   GERD (gastroesophageal reflux disease)    Hemorrhoids 12/23/2020   Hidradenitis    History of ovarian cyst    Hot flashes 03/12/2015   Hypercholesteremia 01/10/2014   Off lipitor. Elevated cholesterol. Labs discussed. Restart lipitor. Follow lipid panel and liver function tests.   Hyperglycemia 06/24/2019   Major depressive disorder 05/01/2015   Migraine headaches 01/10/2014   Has seen neurology. Topamax.   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 Level is 21.6   Past Surgical History:  Procedure Laterality Date   ABDOMINAL HYSTERECTOMY  03/27/07   laparoscopic   BREAST EXCISIONAL BIOPSY Left 2004   fibroadenoma   CESAREAN SECTION     1999/2004 twins   chiari decompression     COLONOSCOPY WITH PROPOFOL N/A 04/28/2015   Procedure: COLONOSCOPY WITH PROPOFOL;  Surgeon: Manya Silvas, MD;  Location: [facility] ENDOSCOPY;  Service: Endoscopy;  Laterality: N/A;   COLONOSCOPY WITH PROPOFOL N/A 03/19/2021   Procedure: COLONOSCOPY WITH PROPOFOL;  Surgeon: Lesly Rubenstein, MD;  Location: [facility] ENDOSCOPY;  Service: Endoscopy;  Laterality: N/A;   COLPOSCOPY N/A 04/24/2022   Procedure: COLPOSCOPY, ABLATION OF VULVAR TISSUE;  Surgeon: Gillis Ends, MD;  Location: [facility] ORS;  Service: Gynecology;  Laterality: N/A;   CYSTOSCOPY     bladder ulcers   ESOPHAGOGASTRODUODENOSCOPY N/A 04/28/2015   Procedure: ESOPHAGOGASTRODUODENOSCOPY (EGD);  Surgeon: Manya Silvas, MD;  Location: [facility] ENDOSCOPY;  Service: Endoscopy;  Laterality: N/A;   ESOPHAGOGASTRODUODENOSCOPY (EGD) WITH PROPOFOL N/A 03/19/2021   Procedure: ESOPHAGOGASTRODUODENOSCOPY (EGD) WITH PROPOFOL;  Surgeon: Lesly Rubenstein, MD;  Location: [facility] ENDOSCOPY;  Service: Endoscopy;  Laterality: N/A;   HERNIA REPAIR Right 2003   inguinal   INCISION AND DRAINAGE HIP Right 05/11/2020   Procedure: IRRIGATION AND DEBRIDEMENT HIP;  Surgeon: Hessie Knows, MD;  Location: [facility] ORS;  Service: Orthopedics;  Laterality: Right;   OVARIAN CYST REMOVAL  2001   right   TEMPOROMANDIBULAR JOINT SURGERY  2000   TOTAL HIP ARTHROPLASTY Right 12/28/2019   Procedure: TOTAL HIP ARTHROPLASTY ANTERIOR APPROACH;  Surgeon: Hessie Knows, MD;  Location: [facility] ORS;  Service: Orthopedics;  Laterality: Right;   TUBAL LIGATION     Bilateral   VULVA /PERINEUM BIOPSY  04/24/2022   Procedure: VULVAR BIOPSY;  Surgeon: Gillis Ends, MD;  Location: [facility] ORS;  Service: Gynecology;;   Family History  Problem Relation Age of Onset   Hyperlipidemia Mother    Anxiety disorder Mother    Atrial fibrillation Mother    Hypertension Mother    Memory loss Mother    Anxiety disorder Father    Stroke Father    Hyperlipidemia Father    Hypertension Father    Congestive Heart Failure Father    Depression Father    Hyperlipidemia Brother    Colon cancer Paternal Aunt    Dementia Paternal Aunt    Dementia Maternal Grandmother    Dementia Paternal Grandmother    ADD / ADHD Daughter    Breast cancer Neg Hx    Social History   Socioeconomic History   Marital status: Married    Spouse name: Gerald Stabs   Number of children: 3   Years of education: 13   Highest education level: Some college, no degree  Occupational History   Occupation: Disability  Tobacco Use   Smoking status: Never   Smokeless tobacco: Never  Vaping Use   Vaping Use: Never used  Substance and Sexual Activity   Alcohol use: Not Currently    Alcohol/week: 0.0 standard drinks of alcohol   Drug use: No   Sexual activity: Yes    Birth control/protection: Surgical    Comment: Hysterectomy  Other Topics Concern   Not on file  Social History Narrative   Patient drinks 2-3 cups of caffeine daily.   Patient is right handed.       Social Hx:   Current living situation- [place]. Living with husband and 3 kids   Born and Raised- [place].raised by both parents.    Siblings- 1 brother who is 7 yrs older than her   Legal issues- none   Married for 24 yrs. Currently unemployed since 2011 when she hurt her back. HS grad and CNA and phlebotomy.    Social Determinants of Health   Financial Resource Strain: Low Risk  (02/18/2022)   Overall Financial Resource Strain  (CARDIA)    Difficulty of Paying Living Expenses: Not hard at all  Food Insecurity: No Food Insecurity (02/18/2022)   Hunger Vital Sign    Worried About Running Out of Food in the Last Year: Never true    Ran Out of Food in the Last Year: Never true  Transportation Needs: No Transportation Needs (02/18/2022)   PRAPARE - Hydrologist (Medical): No    Lack of Transportation (Non-Medical): No  Physical Activity: Insufficiently Active (02/18/2022)   Exercise Vital Sign    Days of Exercise per Week: 7 days    Minutes of Exercise per Session: 10 min  Stress: No Stress Concern Present (02/18/2022)   [place]  Feeling of Stress : Not at all  Social Connections: Socially Integrated (02/18/2022)   Social Connection and Isolation Panel [NHANES]    Frequency of Communication with Friends and Family: More than three times a week    Frequency of Social Gatherings with Friends and Family: More than three times a week    Attends Religious Services: More than 4 times per year    Active Member of [employer] or Organizations: Not on file    Attends Music therapist: More than 4 times per year    Marital Status: Married     Review of Systems     Objective:     BP (!) 142/84 (BP Location: Left Arm, Patient Position: Sitting, Cuff Size: Normal)   Pulse 72   Temp 97.8 F (36.6 C) (Oral)   Ht '5\' 10"'$  (1.778 m)   Wt 214 lb 9.6 oz (97.3 kg)   LMP 12/08/2006   SpO2 99%   BMI 30.79 kg/m  Wt Readings from Last 3 Encounters:  07/19/22 214 lb 9.6 oz (97.3 kg)  06/12/22 220 lb 1.6 oz (99.8 kg)  05/08/22 225 lb 4.8 oz (102.2 kg)    Physical Exam   Outpatient Encounter Medications as of 07/19/2022  Medication Sig   acetaminophen (TYLENOL) 325 MG tablet Take 500 mg by mouth every 6 (six) hours as needed.   AIMOVIG 140 MG/ML SOAJ Inject 140 mg into the skin every 28 (twenty-eight) days.   atorvastatin  (LIPITOR) 10 MG tablet Take 10 mg by mouth daily.   azelastine (ASTELIN) 0.1 % nasal spray Place 1 spray into both nostrils 2 (two) times daily. Use in each nostril as directed   clobetasol (TEMOVATE) 0.05 % external solution Apply topically daily as needed.   cyanocobalamin (,VITAMIN B-12,) 1000 MCG/ML injection INJECT 1 ML INTRAMUSCULARLY ONCE EVERY MONTH   diclofenac Sodium (VOLTAREN) 1 % GEL SMARTSIG:Gram(s) Topical 4 Times Daily PRN   esomeprazole (NEXIUM) 40 MG capsule Take 1 capsule by mouth once daily   lactase (RA DAIRY AID) 3000 units tablet Take 9,000 Units by mouth 3 (three) times daily as needed (with dairy consumption).    lidocaine (XYLOCAINE) 2 % jelly Apply 1 application. topically as needed.   LORazepam (ATIVAN) 0.5 MG tablet Take 1 tablet (0.5 mg total) by mouth daily as needed for anxiety.   methocarbamol (ROBAXIN) 500 MG tablet Take 500 mg by mouth 4 (four) times daily.   mupirocin ointment (BACTROBAN) 2 % Apply to healing wound TID.   nystatin cream (MYCOSTATIN) Apply topically 2 (two) times daily.   oxyCODONE-acetaminophen (PERCOCET) 10-325 MG tablet Take by mouth.   rizatriptan (MAXALT) 10 MG tablet Take 10 mg by mouth as needed for migraine. Kathleen repeat in 2 hours if needed   Semaglutide,0.25 or 0.'5MG'$ /DOS, (OZEMPIC, 0.25 OR 0.5 MG/DOSE,) 2 MG/1.5ML SOPN Inject 2 mg into the skin.   sertraline (ZOLOFT) 100 MG tablet Take 1 tablet (100 mg total) by mouth daily.   Simethicone (GAS RELIEF PO) Take 125 mg by mouth 2 (two) times daily as needed for flatulence. Anti gas   tacrolimus (PROTOPIC) 0.1 % ointment APPLY TOPICALLY TWO (2) (TWO) TIMES DAILY.   topiramate (TOPAMAX) 25 MG capsule Take 25 mg by mouth 2 (two) times daily.   Ubrogepant (UBRELVY) 100 MG TABS Take 100 mg by mouth 2 (two) times daily as needed.   gabapentin (NEURONTIN) 400 MG capsule Take by mouth.   [DISCONTINUED] diclofenac sodium (VOLTAREN) 1 % GEL Apply 1 application topically 4 (  four) times daily as needed  (pain.).  (Patient not taking: Reported on 07/19/2022)   [DISCONTINUED] lithium carbonate (LITHOBID) 300 MG CR tablet Take 1 tablet (300 mg total) by mouth at bedtime. (Patient not taking: Reported on 07/19/2022)   [DISCONTINUED] Semaglutide, 2 MG/DOSE, 8 MG/3ML SOPN Inject 2 mg as directed once a week. (Patient not taking: Reported on 07/19/2022)   No facility-administered encounter medications on file as of 07/19/2022.     Lab Results  Component Value Date   WBC 5.4 07/17/2021   HGB 13.5 07/17/2021   HCT 41.3 07/17/2021   PLT 233.0 07/17/2021   GLUCOSE 83 03/06/2022   CHOL 248 (H) 03/06/2022   TRIG 169.0 (H) 03/06/2022   HDL 45.00 03/06/2022   LDLDIRECT 181.0 06/26/2020   LDLCALC 169 (H) 03/06/2022   ALT 31 03/06/2022   AST 24 03/06/2022   NA 141 03/06/2022   K 4.0 03/06/2022   CL 106 03/06/2022   CREATININE 0.69 03/06/2022   BUN 12 03/06/2022   CO2 30 03/06/2022   TSH 1.59 07/17/2021   INR 1.1 12/03/2019   HGBA1C 5.6 03/06/2022      Assessment & Plan:   Problem List Items Addressed This Visit   None    [name], MD

## 2022-07-20 ENCOUNTER — Encounter: Payer: Self-pay | Admitting: Internal Medicine

## 2022-07-20 DIAGNOSIS — R21 Rash and other nonspecific skin eruption: Secondary | ICD-10-CM | POA: Insufficient documentation

## 2022-07-20 NOTE — Assessment & Plan Note (Signed)
Followed by pain clinic.  

## 2022-07-20 NOTE — Assessment & Plan Note (Signed)
Seeing psychiatry.  On zoloft.  Follow.  Appears to be stable.  

## 2022-07-20 NOTE — Assessment & Plan Note (Signed)
On lipitor.  Low cholesterol diet and exercise.  Follow lipid panel and liver function tests.   

## 2022-07-20 NOTE — Assessment & Plan Note (Signed)
Physical today 07/19/22.  Due pap.  Seeing oncology.  Colonoscopy 03/2021 - internal hemorrhoids.  recomemnded f/u in 5 years.  Mammogram - schedule diagnostic mammogram given palpable nodules.

## 2022-07-20 NOTE — Assessment & Plan Note (Addendum)
Continue nexium 

## 2022-07-20 NOTE — Assessment & Plan Note (Signed)
Seeing psychiatry.  On zoloft. Appears to be stable.

## 2022-07-20 NOTE — Assessment & Plan Note (Signed)
Axillary - rash - nystatin cream as directed.  Follow.

## 2022-07-20 NOTE — Assessment & Plan Note (Signed)
Low carb diet and exercise.  Follow met b and a1c.  

## 2022-07-20 NOTE — Assessment & Plan Note (Signed)
Abdominal discomfort/bloating.  Some constipation.  Discussed benefiber and colace.  Check KUB.  Further w/up pending results.

## 2022-07-20 NOTE — Assessment & Plan Note (Signed)
S/p surgery.  Followed by neurology.  

## 2022-07-21 ENCOUNTER — Encounter: Payer: Self-pay | Admitting: *Deleted

## 2022-07-23 ENCOUNTER — Other Ambulatory Visit: Payer: Self-pay

## 2022-07-23 DIAGNOSIS — E7800 Pure hypercholesterolemia, unspecified: Secondary | ICD-10-CM

## 2022-07-23 MED ORDER — ATORVASTATIN CALCIUM 10 MG PO TABS: 10.0000 mg | ORAL_TABLET | Freq: Every day | ORAL | 1 refills | Status: DC

## 2022-07-24 ENCOUNTER — Encounter: Payer: Medicare HMO | Admitting: Speech Pathology

## 2022-07-24 ENCOUNTER — Ambulatory Visit: Payer: Medicare HMO | Admitting: Physical Therapy

## 2022-07-26 ENCOUNTER — Encounter: Payer: Medicare HMO | Admitting: Speech Pathology

## 2022-07-26 ENCOUNTER — Ambulatory Visit: Payer: Medicare HMO | Admitting: Physical Therapy

## 2022-07-30 DIAGNOSIS — M5441 Lumbago with sciatica, right side: Secondary | ICD-10-CM | POA: Diagnosis not present

## 2022-07-30 DIAGNOSIS — M5137 Other intervertebral disc degeneration, lumbosacral region: Secondary | ICD-10-CM | POA: Diagnosis not present

## 2022-07-30 DIAGNOSIS — M47816 Spondylosis without myelopathy or radiculopathy, lumbar region: Secondary | ICD-10-CM | POA: Diagnosis not present

## 2022-07-30 DIAGNOSIS — M5412 Radiculopathy, cervical region: Secondary | ICD-10-CM | POA: Diagnosis not present

## 2022-07-30 DIAGNOSIS — M5442 Lumbago with sciatica, left side: Secondary | ICD-10-CM | POA: Diagnosis not present

## 2022-07-31 ENCOUNTER — Ambulatory Visit: Payer: Medicare HMO | Admitting: Physical Therapy

## 2022-07-31 ENCOUNTER — Encounter: Payer: Medicare HMO | Admitting: Speech Pathology

## 2022-08-02 ENCOUNTER — Encounter: Payer: Medicare HMO | Admitting: Speech Pathology

## 2022-08-02 ENCOUNTER — Ambulatory Visit: Payer: Medicare HMO | Admitting: Physical Therapy

## 2022-08-06 ENCOUNTER — Encounter: Payer: Self-pay | Admitting: Internal Medicine

## 2022-08-07 ENCOUNTER — Other Ambulatory Visit: Payer: Self-pay

## 2022-08-07 ENCOUNTER — Other Ambulatory Visit: Payer: Medicare HMO

## 2022-08-07 ENCOUNTER — Ambulatory Visit: Payer: Medicare HMO | Admitting: Physical Therapy

## 2022-08-07 ENCOUNTER — Encounter: Payer: Medicare HMO | Admitting: Speech Pathology

## 2022-08-07 MED ORDER — OZEMPIC (0.25 OR 0.5 MG/DOSE) 2 MG/1.5ML ~~LOC~~ SOPN
2.0000 mg | PEN_INJECTOR | SUBCUTANEOUS | 1 refills | Status: DC
Start: 2022-08-08 — End: 2022-08-12

## 2022-08-08 ENCOUNTER — Other Ambulatory Visit: Payer: Self-pay | Admitting: Pain Medicine

## 2022-08-08 DIAGNOSIS — M5137 Other intervertebral disc degeneration, lumbosacral region: Secondary | ICD-10-CM

## 2022-08-08 DIAGNOSIS — Z0100 Encounter for examination of eyes and vision without abnormal findings: Secondary | ICD-10-CM | POA: Diagnosis not present

## 2022-08-08 DIAGNOSIS — M47816 Spondylosis without myelopathy or radiculopathy, lumbar region: Secondary | ICD-10-CM

## 2022-08-08 DIAGNOSIS — M5442 Lumbago with sciatica, left side: Secondary | ICD-10-CM

## 2022-08-09 ENCOUNTER — Ambulatory Visit
Admission: RE | Admit: 2022-08-09 | Discharge: 2022-08-09 | Disposition: A | Discharge status: Home / Self Care | Payer: Medicare HMO | Source: Ambulatory Visit | Attending: Internal Medicine | Admitting: Internal Medicine

## 2022-08-09 ENCOUNTER — Ambulatory Visit: Payer: Medicare HMO | Attending: Internal Medicine | Admitting: Speech Pathology

## 2022-08-09 ENCOUNTER — Other Ambulatory Visit: Payer: Self-pay

## 2022-08-09 ENCOUNTER — Encounter: Payer: Medicare HMO | Admitting: Speech Pathology

## 2022-08-09 ENCOUNTER — Encounter: Payer: Self-pay | Admitting: Speech Pathology

## 2022-08-09 ENCOUNTER — Ambulatory Visit: Payer: Medicare HMO | Admitting: Physical Therapy

## 2022-08-09 DIAGNOSIS — N63 Unspecified lump in unspecified breast: Secondary | ICD-10-CM

## 2022-08-09 DIAGNOSIS — F801 Expressive language disorder: Secondary | ICD-10-CM | POA: Insufficient documentation

## 2022-08-09 DIAGNOSIS — R928 Other abnormal and inconclusive findings on diagnostic imaging of breast: Secondary | ICD-10-CM | POA: Diagnosis not present

## 2022-08-09 DIAGNOSIS — R41841 Cognitive communication deficit: Secondary | ICD-10-CM | POA: Diagnosis not present

## 2022-08-09 DIAGNOSIS — R922 Inconclusive mammogram: Secondary | ICD-10-CM | POA: Diagnosis not present

## 2022-08-09 DIAGNOSIS — N6489 Other specified disorders of breast: Secondary | ICD-10-CM | POA: Diagnosis not present

## 2022-08-09 NOTE — Therapy (Signed)
OUTPATIENT SPEECH LANGUAGE PATHOLOGY EVALUATION   Patient Name: Kathleen Everett MRN: [id number] DOB:12/08/1970, 51 y.o., female Today's Date: 08/09/2022  PCP: [name], MD REFERRING PROVIDER: [name], MD   End of Session - 08/09/22 1027     Visit Number 1    Number of Visits 7    Date for SLP Re-Evaluation 09/20/22    Authorization Type [employer]    Progress Note Due on Visit 10    SLP Start Time 0930    SLP Stop Time  1022    SLP Time Calculation (min) 52 min    Activity Tolerance Patient tolerated treatment well             Past Medical History:  Diagnosis Date   Abdominal pain 07/24/2014   Abscess of right hip 05/11/2020   Anemia 01/02/2020   Ankle pain, right 04/24/2014   Breast pain, right 09/08/2014   Chest pain 02/15/2015   Chiari malformation type I 01/26/2014   s/p sgy decompression   Chronic back pain 12/13/2012   Chronic gastritis 07/18/2015   Chronic tension-type headache, intractable 04/24/2015   DDD (degenerative disc disease), lumbar    Diverticulitis    Dysmenorrhea    Dysplastic nevus 10/09/2015   R abdomen - moderate, limited margins free.   Endometriosis    Generalized anxiety disorder 05/01/2015   Has previously seen psychiatry. Having some issues with what she feels is related to stress and anxiety   GERD (gastroesophageal reflux disease)    Hemorrhoids 12/23/2020   Hidradenitis    History of ovarian cyst    Hot flashes 03/12/2015   Hypercholesteremia 01/10/2014   Off lipitor. Elevated cholesterol. Labs discussed. Restart lipitor. Follow lipid panel and liver function tests.   Hyperglycemia 06/24/2019   Major depressive disorder 05/01/2015   Migraine headaches 01/10/2014   Has seen neurology. Topamax.   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 Level is 21.6   Past Surgical History:  Procedure Laterality Date   ABDOMINAL HYSTERECTOMY  03/27/07   laparoscopic   BREAST EXCISIONAL BIOPSY Left 2004   fibroadenoma   CESAREAN SECTION     1999/2004 twins   chiari decompression     COLONOSCOPY WITH PROPOFOL N/A 04/28/2015   Procedure: COLONOSCOPY WITH PROPOFOL;  Surgeon: Manya Silvas, MD;  Location: [facility] ENDOSCOPY;  Service: Endoscopy;  Laterality: N/A;   COLONOSCOPY WITH PROPOFOL N/A 03/19/2021   Procedure: COLONOSCOPY WITH PROPOFOL;  Surgeon: Lesly Rubenstein, MD;  Location: [facility] ENDOSCOPY;  Service: Endoscopy;  Laterality: N/A;   COLPOSCOPY N/A 04/24/2022   Procedure: COLPOSCOPY, ABLATION OF VULVAR TISSUE;  Surgeon: Gillis Ends, MD;  Location: [facility] ORS;  Service: Gynecology;  Laterality: N/A;   CYSTOSCOPY     bladder ulcers   ESOPHAGOGASTRODUODENOSCOPY N/A 04/28/2015   Procedure: ESOPHAGOGASTRODUODENOSCOPY (EGD);  Surgeon: Manya Silvas, MD;  Location[facility] ENDOSCOPY;  Service: Endoscopy;  Laterality: N/A;   ESOPHAGOGASTRODUODENOSCOPY (EGD) WITH PROPOFOL N/A 03/19/2021  Procedure: ESOPHAGOGASTRODUODENOSCOPY (EGD) WITH PROPOFOL;  Surgeon: Lesly Rubenstein, MD;  Location: [facility] ENDOSCOPY;  Service: Endoscopy;  Laterality: N/A;   HERNIA REPAIR Right 2003   inguinal   INCISION AND DRAINAGE HIP Right 05/11/2020   Procedure: IRRIGATION AND DEBRIDEMENT HIP;  Surgeon: Hessie Knows, MD;  Location: [facility] ORS;  Service: Orthopedics;  Laterality: Right;   OVARIAN CYST REMOVAL  2001   right   TEMPOROMANDIBULAR JOINT SURGERY  2000   TOTAL HIP ARTHROPLASTY Right 12/28/2019   Procedure: TOTAL HIP ARTHROPLASTY ANTERIOR APPROACH;  Surgeon: Hessie Knows, MD;  Location: [facility] ORS;  Service: Orthopedics;  Laterality: Right;   TUBAL LIGATION     Bilateral   VULVA Milagros Loll BIOPSY  04/24/2022   Procedure: VULVAR BIOPSY;  Surgeon: Gillis Ends, MD;  Location: [facility] ORS;  Service: Gynecology;;   Patient Active Problem List   Diagnosis Date Noted   Rash 07/20/2022   Bloating 07/19/2022   Acute cystitis without hematuria 06/12/2022   Vulvar irritation 06/12/2022   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   Ear fullness 02/09/2022   Runny nose 09/30/2021   Dizziness 07/22/2021   Vitamin B12 deficiency    Vaginal irritation 03/03/2021   Soft tissue mass 03/03/2021   Hemorrhoids 12/23/2020   DDD (degenerative disc disease), lumbar 10/03/2020   Abscess of right hip 05/11/2020   Healthcare maintenance 01/02/2020   Anemia 01/02/2020   Status post total hip replacement, 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   Hyperglycemia 06/24/2019   Vaginal bleeding 09/30/2016   Primary osteoarthritis of right hip 01/17/2016   Arnold-Chiari malformation (HCC)    Chronic gastritis 07/18/2015   Depression, major, in remission ([place]) 05/01/2015   Generalized anxiety disorder 05/01/2015   Chronic tension-type headache, intractable 04/24/2015   Dysmenorrhea 04/24/2015   Endometriosis 04/24/2015   Hidradenitis 04/24/2015   Hot flashes 03/12/2015   Chest pain 02/15/2015   BMI 35.0-35.9,adult 10/16/2014   Cough 09/28/2014   Breast pain, right 09/08/2014   Abdominal pain 07/24/2014   GERD (gastroesophageal reflux disease) 04/24/2014   Ankle pain, right 04/24/2014   Vitamin D deficiency 01/18/2014   Diverticulitis 01/10/2014   Hypercholesteremia 01/10/2014   Right hip pain 01/10/2014   Migraine headaches 01/10/2014   Obstructive sleep apnea 01/10/2014   Chronic back pain 12/13/2012   Mitral valve prolapse 12/13/2012    ONSET DATE: referral 07/25/2022   REFERRING DIAG: R41.89 (ICD-10-CM) - Cognitive change  THERAPY DIAG:  Cognitive communication deficit  Expressive  language impairment  Rationale for Evaluation and Treatment Rehabilitation  SUBJECTIVE:   SUBJECTIVE STATEMENT: "I'm doing ok" Pt accompanied by: self  PERTINENT HISTORY: depression, anxiety, migraines, potential ADHD  PAIN:  Are you having pain? Yes: NPRS scale: 5/10 Pain location: hips, back   FALLS: Has patient fallen in last 6 months?  Yes, Number of falls: 2, Comment: "legs gave out"   LIVING ENVIRONMENT: Lives with: lives with their family and lives with their spouse Lives in: House/apartment  PLOF:  Level of assistance: Needed assistance with IADLS Employment: On disability   PATIENT GOALS "to be able to say what I'm thinking"   OBJECTIVE:   DIAGNOSTIC FINDINGS: MRI 07/18/2022 Normal brain MR for age. No specific etiology for memory loss identified.   [facility] Neurology [facility] Neuropsychology Testing 04/17/2021 - Unremarkable ("no reason to have concerns surrounding an underlying neurocognitive disorder at the present time."  COGNITION: Overall cognitive status: Impaired Areas of impairment:  Attention: Impaired: Sustained, Alternating, Divided, Comment: reports hx of attention deficits, previously on ritalin per pt report Memory:  Impaired: Working Industrial/product designer term Long term Cytogeneticist function: Impaired: Initiation, Organization, Planning, and Slow processing Behavior: Within functional limits Functional deficits: avoiding preferred avocational activities; forgetting appointments, medications; requires A from family for household duties  COGNITIVE COMMUNICATION Following directions: Follows multi-step commands consistently  Auditory comprehension: Impaired: requires repetition to comprehend information, appears 2/2 attention Verbal expression: Impaired: x2 episodes of word finding this date, repairs IND with wait time . Within gross normal limits sequencing and relaying details during procedural discourse task. Demonstrates vague  language, remote recall impairment in story retell task in which pt self-selected movie plot to relay. Usual extended pauses and restarts during conversational exchange with SLP.  Functional communication: Impaired: reporting communication breakdowns at home d/t vague language and word finding episodes. Reports frustrating for pt and family members.   STANDARDIZED ASSESSMENTS: CLQT: Attention: WNL, Memory: Mild, Executive Function: WNL, Language: Mild, Visuospatial Skills: WNL, and Clock Drawing: WNL   PATIENT REPORTED OUTCOME MEASURES (PROM): Deferred d/t time constraints. Will complete 1st therapy session   TODAY'S TREATMENT:  Education provided on evaluation results and SLP's recommendations. Collaborated with pt to generate goals and formulate plan of care. Pt verbalizes agreement with POC, all questions answered to pt's satisfaction.     PATIENT EDUCATION: Education details: see above Person educated: Patient Education method: Customer service manager Education comprehension: verbalized understanding, returned demonstration, and needs further education  GOALS: Goals reviewed with patient? Yes  SHORT TERM GOALS: Target date: 09/06/2022  Pt will demonstrate use of self-selected internal memory strategy during structured task, resulting in 80% accuracy during task completion, with rare min-A. Baseline: Goal status: INITIAL  2.  Pt will complete high level language tasks with 90% accuracy given rare min-A over 2 sessions.  Baseline:  Goal status: INITIAL  3.  Pt will complete meta-cognitive strategy (e.g. goal plan do review) worksheet to facilitate return to scrapbooking or other pt selected avocational activity with rare min-A Baseline:  Goal status: INITIAL   LONG TERM GOALS: Target date: 09/20/2022  Pt will employ strategies selected in plan for scrapbooking or other avocational activity, reporting successful completion of 2 pages over 1 week period  Baseline:   Goal status: INITIAL  2.  Pt will report generalization of internal memory strategies resulting in subjective perception of reduced memory lapses over 1 week period Baseline:  Goal status: INITIAL  3.  Pt will employ anomia strategies during 30 minute conversation, with no more than 3 verbal cues, resulting coherent discourse sample with SLP Baseline:  Goal status: INITIAL  4.  Pt will use external aid to assist in initiation and recall of desired home based tasks, resulting in completion of x3 pt selected chores or activities on 4 days across 1 week period Baseline:  Goal status: INITIAL  5.  Pt will report subjective perception of improvement re: cognitive functioning via PROM by d/c Baseline:  Goal status: INITIAL  ASSESSMENT:  CLINICAL IMPRESSION: Patient is a 50 y.o. F who was seen today for cognitive linguistic evaluation. Pt reporting change in cognition, ongoing 2+ years per pt report. Pt primary complaints are change in verbal expression and memory. Pt tells SLP she is heavily reliant on phone to recall appointments, medications, conversations, etc. Verbal expression characterized by vague language, disorganization, anomia/dysnomia. Reports attention deficits at baseline. SLP administered the Cognitive Linguistic Quick Test, evidencing mild impairments in memory and language. All other domains within normal limits. Demonstrates awareness, to include self-correction, during assessment. Standardized testing is supportive of pt's reporting  of deficits. Informal evaluation further reveals impairment in executive functioning (organization, planning, slowed processing, and initiation). Pt's cognitive deficits are impacting successful management of schedule, medication, and engagement in previously enjoyed avocational activities such as scrapbooking. SLP recommends skilled ST interventions targeting verbal expression, executive dysfunction, and memory to enhance communication efficacy and  confidence, increase participation in needed and desired activities, and in effort to improve pt's QoL.   OBJECTIVE IMPAIRMENTS include attention, memory, executive functioning, and expressive language. These impairments are limiting patient from managing medications, managing appointments, household responsibilities, and effectively communicating at home and in community. Factors affecting potential to achieve goals and functional outcome are  n/a . Patient will benefit from skilled SLP services to address above impairments and improve overall function.  REHAB POTENTIAL: Good  PLAN: SLP FREQUENCY: 1x/week  SLP DURATION: 6 weeks  PLANNED INTERVENTIONS: Language facilitation, Cueing hierachy, Cognitive reorganization, Internal/external aids, Functional tasks, SLP instruction and feedback, Compensatory strategies, and Patient/family education  Su Monks, CCC-SLP 08/09/2022, 11:19 AM

## 2022-08-12 ENCOUNTER — Other Ambulatory Visit: Payer: Self-pay

## 2022-08-12 MED ORDER — SEMAGLUTIDE (2 MG/DOSE) 8 MG/3ML ~~LOC~~ SOPN: 2.0000 mg | PEN_INJECTOR | SUBCUTANEOUS | 0 refills | Status: DC

## 2022-08-14 ENCOUNTER — Encounter: Payer: Medicare HMO | Admitting: Speech Pathology

## 2022-08-14 ENCOUNTER — Ambulatory Visit: Payer: Medicare HMO | Admitting: Physical Therapy

## 2022-08-15 ENCOUNTER — Ambulatory Visit (INDEPENDENT_AMBULATORY_CARE_PROVIDER_SITE_OTHER): Payer: Medicare HMO | Admitting: Dermatology

## 2022-08-15 ENCOUNTER — Encounter: Payer: Self-pay | Admitting: Dermatology

## 2022-08-15 DIAGNOSIS — Z1283 Encounter for screening for malignant neoplasm of skin: Secondary | ICD-10-CM

## 2022-08-15 DIAGNOSIS — L570 Actinic keratosis: Secondary | ICD-10-CM | POA: Diagnosis not present

## 2022-08-15 DIAGNOSIS — L821 Other seborrheic keratosis: Secondary | ICD-10-CM

## 2022-08-15 DIAGNOSIS — L820 Inflamed seborrheic keratosis: Secondary | ICD-10-CM

## 2022-08-15 DIAGNOSIS — L219 Seborrheic dermatitis, unspecified: Secondary | ICD-10-CM

## 2022-08-15 DIAGNOSIS — L814 Other melanin hyperpigmentation: Secondary | ICD-10-CM

## 2022-08-15 DIAGNOSIS — L309 Dermatitis, unspecified: Secondary | ICD-10-CM | POA: Diagnosis not present

## 2022-08-15 DIAGNOSIS — Z86018 Personal history of other benign neoplasm: Secondary | ICD-10-CM | POA: Diagnosis not present

## 2022-08-15 DIAGNOSIS — L578 Other skin changes due to chronic exposure to nonionizing radiation: Secondary | ICD-10-CM | POA: Diagnosis not present

## 2022-08-15 DIAGNOSIS — L259 Unspecified contact dermatitis, unspecified cause: Secondary | ICD-10-CM

## 2022-08-15 DIAGNOSIS — D229 Melanocytic nevi, unspecified: Secondary | ICD-10-CM

## 2022-08-15 DIAGNOSIS — D1801 Hemangioma of skin and subcutaneous tissue: Secondary | ICD-10-CM

## 2022-08-15 DIAGNOSIS — D2372 Other benign neoplasm of skin of left lower limb, including hip: Secondary | ICD-10-CM

## 2022-08-15 MED ORDER — HYDROCORTISONE 2.5 % EX CREA: TOPICAL_CREAM | CUTANEOUS | 2 refills | Status: DC

## 2022-08-15 MED ORDER — TACROLIMUS 0.1 % EX OINT: TOPICAL_OINTMENT | CUTANEOUS | 2 refills | Status: DC

## 2022-08-15 MED ORDER — OPZELURA 1.5 % EX CREA: TOPICAL_CREAM | CUTANEOUS | 3 refills | Status: DC

## 2022-08-15 MED ORDER — KETOCONAZOLE 2 % EX CREA: 1.0000 | TOPICAL_CREAM | Freq: Two times a day (BID) | CUTANEOUS | 0 refills | Status: AC

## 2022-08-15 NOTE — Progress Notes (Signed)
Follow-Up Visit   Subjective  Kathleen Everett is a 51 y.o. female who presents for the following: Annual Exam (HxDN) and Rash (Hand dermatitis. Used Clobetasol ointment and Tacrolimus ointment as needed/as directed).  The patient presents for Total-Body Skin Exam (TBSE) for skin cancer screening and mole check.  The patient has spots, moles and lesions to be evaluated, some may be new or changing and the patient has concerns that these could be cancer.   The following portions of the chart were reviewed this encounter and updated as appropriate:  Tobacco  Allergies  Meds  Problems  Med Hx  Surg Hx  Fam Hx      Review of Systems: No other skin or systemic complaints except as noted in HPI or Assessment and Plan.   Objective  Well appearing patient in no apparent distress; mood and affect are within normal limits.  A full examination was performed including scalp, head, eyes, ears, nose, lips, neck, chest, axillae, abdomen, back, buttocks, bilateral upper extremities, bilateral lower extremities, hands, feet, fingers, toes, fingernails, and toenails. All findings within normal limits unless otherwise noted below.  B/L hands Scaly erythematous papules and patches +/- dyspigmentation, lichenification, excoriations. Signs of thinning.   Axillae Scaly erythematous plaques  B/L plantar feet Erythema with inflammation  right zygoma x1, right temple x1, nasal dorsum x2 (4) Erythematous thin papules/macules with gritty scale.   Nose Pink patches with greasy scale.   Left Upper Back Erythematous keratotic or waxy stuck-on papule or plaque.   Assessment & Plan   History of Dysplastic Nevus. Right abdomen, moderate. 2016. - No evidence of recurrence today - Recommend regular full body skin exams - Recommend daily broad spectrum sunscreen SPF 30+ to sun-exposed areas, reapply every 2 hours as needed.  - Call if any new or changing lesions are noted between office  visits  Hand dermatitis B/L hands  Chronic and persistent condition with duration or expected duration over one year. Condition is bothersome/symptomatic for patient. Currently flared and with some atrophy from clobetasol.  Start Opzelura cream twice daily to hands. Continue Tacrolimus ointment twice daily as needed.  Stop Clobetasol ointment. Avoid in future due to atrophy.  Has failed Clobetasol and Tacrolimus.   Recommend OTC Gold Bond Rapid Relief Anti-Itch cream (pramoxine + menthol), CeraVe Anti-itch cream or lotion (pramoxine), Sarna lotion (Original- menthol + camphor or Sensitive- pramoxine) or Eucerin 12 hour Itch Relief lotion (menthol) up to 3 times per day to areas on body that are itchy.   Ruxolitinib Phosphate (OPZELURA) 1.5 % CREA - B/L hands Apply twice daily to hands and feet  Related Medications tacrolimus (PROTOPIC) 0.1 % ointment APPLY TOPICALLY TWO (2) (TWO) TIMES DAILY.  Contact dermatitis, unspecified contact dermatitis type, unspecified trigger Axillae  Favor irritant contact dermatitis secondary to deodorant or shaving  Recommend using Vanicream deodorant.   Start opzelura twice a day as needed Start Ketoconazole 2% cream twice daily as needed.   Topical steroids (such as triamcinolone, fluocinolone, fluocinonide, mometasone, clobetasol, halobetasol, betamethasone, hydrocortisone) can cause thinning and lightening of the skin if they are used for too long in the same area. Your physician has selected the right strength medicine for your problem and area affected on the body. Please use your medication only as directed by your physician to prevent side effects.    ketoconazole (NIZORAL) 2 % cream - Axillae Apply 1 Application topically 2 (two) times daily.  Dermatitis B/L plantar feet  se Ketoconazole cream twice daily  when using opzelura to prevent fungal infection as needed.  Use Opzelura twice daily.  AK (actinic keratosis) (4) right zygoma x1,  right temple x1, nasal dorsum x2  Actinic keratoses are precancerous spots that appear secondary to cumulative UV radiation exposure/sun exposure over time. They are chronic with expected duration over 1 year. A portion of actinic keratoses will progress to squamous cell carcinoma of the skin. It is not possible to reliably predict which spots will progress to skin cancer and so treatment is recommended to prevent development of skin cancer.  Recommend daily broad spectrum sunscreen SPF 30+ to sun-exposed areas, reapply every 2 hours as needed.  Recommend staying in the shade or wearing long sleeves, sun glasses (UVA+UVB protection) and wide brim hats (4-inch brim around the entire circumference of the hat). Call for new or changing lesions.  Destruction of lesion - right zygoma x1, right temple x1, nasal dorsum x2  Destruction method: cryotherapy   Informed consent: discussed and consent obtained   Lesion destroyed using liquid nitrogen: Yes   Region frozen until ice ball extended beyond lesion: Yes   Outcome: patient tolerated procedure well with no complications   Post-procedure details: wound care instructions given   Additional details:  Prior to procedure, discussed risks of blister formation, small wound, skin dyspigmentation, or rare scar following cryotherapy. Recommend Vaseline ointment to treated areas while healing.   Seborrheic dermatitis Nose  Chronic condition with duration or expected duration over one year. Currently well-controlled.   Use Ketoconazole 2% cream to face twice daily as needed.   Use clobetasol solution 1-2 times a day to scalp as needed for itch  Inflamed seborrheic keratosis Left Upper Back  Symptomatic, irritating. Benign-appearing  Recommend using Opzelura twice daily as needed. Consider cryotherapy if still bothersome   Lentigines - Scattered tan macules - Due to sun exposure - Benign-appearing, observe - Recommend daily broad spectrum  sunscreen SPF 30+ to sun-exposed areas, reapply every 2 hours as needed. - Call for any changes  Seborrheic Keratoses - Stuck-on, waxy, tan-brown papules and/or plaques  - Benign-appearing - Discussed benign etiology and prognosis. - Observe - Call for any changes  Melanocytic Nevi - Tan-brown and/or pink-flesh-colored symmetric macules and papules - Benign appearing on exam today - Observation - Call clinic for new or changing moles - Recommend daily use of broad spectrum spf 30+ sunscreen to sun-exposed areas.   Hemangiomas - Red papules - Discussed benign nature - Observe - Call for any changes  Actinic Damage - Chronic condition, secondary to cumulative UV/sun exposure - diffuse scaly erythematous macules with underlying dyspigmentation - Recommend daily broad spectrum sunscreen SPF 30+ to sun-exposed areas, reapply every 2 hours as needed.  - Staying in the shade or wearing long sleeves, sun glasses (UVA+UVB protection) and wide brim hats (4-inch brim around the entire circumference of the hat) are also recommended for sun protection.  - Call for new or changing lesions.  Skin cancer screening performed today.  Dermatofibroma. Left lateral calf. - Firm pink/brown papulenodule with dimple sign - Benign appearing - Call for any changes    Return in about 1 year (around 08/16/2023) for TBSE, HxDN.  I, Emelia Salisbury, CMA, am acting as scribe for Forest Gleason, MD.  Documentation: I have reviewed the above documentation for accuracy and completeness, and I agree with the above.  Forest Gleason, MD

## 2022-08-15 NOTE — Patient Instructions (Addendum)
Underarms: Start Hydrocortisone 2.5% cream twice daily 1-2 weeks as needed only. Start Ketoconazole 2% cream twice daily as needed.  Hand Dermatitis: Start Opzelura cream twice daily to hands. Continue Tacrolimus ointment twice daily as needed.  Stop Clobetasol ointment.  Feet: Use Ketoconazole cream twice daily as needed.  Use Opzelura twice daily.  Dandruff at creases of nose: Use Ketoconazole 2% cream here twice daily as needed.    Cryotherapy Aftercare  Wash gently with soap and water everyday.   Apply Vaseline daily until healed.   Recommend OTC Gold Bond Rapid Relief Anti-Itch cream (pramoxine + menthol), CeraVe Anti-itch cream or lotion (pramoxine), Sarna lotion (Original- menthol + camphor or Sensitive- pramoxine) or Eucerin 12 hour Itch Relief lotion (menthol) up to 3 times per day to areas on body that are itchy.   Recommend taking Heliocare sun protection supplement daily in sunny weather for additional sun protection. For maximum protection on the sunniest days, you can take up to 2 capsules of regular Heliocare OR take 1 capsule of Heliocare Ultra. For prolonged exposure (such as a full day in the sun), you can repeat your dose of the supplement 4 hours after your first dose. Heliocare can be purchased at [employer], at some [facility] or at [email or web address].     Recommend daily broad spectrum sunscreen SPF 30+ to sun-exposed areas, reapply every 2 hours as needed. Call for new or changing lesions.  Staying in the shade or wearing long sleeves, sun glasses (UVA+UVB protection) and wide brim hats (4-inch brim around the entire circumference of the hat) are also recommended for sun protection.     Melanoma ABCDEs  Melanoma is the most dangerous type of skin cancer, and is the leading cause of death from skin disease.  You are more likely to develop melanoma if you: Have light-colored skin, light-colored eyes, or red or blond hair Spend a lot of time in the  sun Tan regularly, either outdoors or in a tanning bed Have had blistering sunburns, especially during childhood Have a close family member who has had a melanoma Have atypical moles or large birthmarks  Early detection of melanoma is key since treatment is typically straightforward and cure rates are extremely high if we catch it early.   The first sign of melanoma is often a change in a mole or a new dark spot.  The ABCDE system is a way of remembering the signs of melanoma.  A for asymmetry:  The two halves do not match. B for border:  The edges of the growth are irregular. C for color:  A mixture of colors are present instead of an even brown color. D for diameter:  Melanomas are usually (but not always) greater than 7m - the size of a pencil eraser. E for evolution:  The spot keeps changing in size, shape, and color.  Please check your skin once per month between visits. You can use a small mirror in front and a large mirror behind you to keep an eye on the back side or your body.   If you see any new or changing lesions before your next follow-up, please call to schedule a visit.  Please continue daily skin protection including broad spectrum sunscreen SPF 30+ to sun-exposed areas, reapplying every 2 hours as needed when you're outdoors.   Staying in the shade or wearing long sleeves, sun glasses (UVA+UVB protection) and wide brim hats (4-inch brim around the entire circumference of the hat) are also recommended for sun  protection.     Due to recent changes in healthcare laws, you may see results of your pathology and/or laboratory studies on MyChart before the doctors have had a chance to review them. We understand that in some cases there may be results that are confusing or concerning to you. Please understand that not all results are received at the same time and often the doctors may need to interpret multiple results in order to provide you with the best plan of care or course  of treatment. Therefore, we ask that you please give [place] 2 business days to thoroughly review all your results before contacting the office for clarification. Should we see a critical lab result, you will be contacted sooner.   If You Need Anything After Your Visit  If you have any questions or concerns for your doctor, please call our main line at [phone] and press option 4 to reach your doctor's medical assistant. If no one answers, please leave a voicemail as directed and we will return your call as soon as possible. Messages left after 4 pm will be answered the following business day.   You may also send [place] a message via [place]. We typically respond to MyChart messages within 1-2 business days.  For prescription refills, please ask your pharmacy to contact our office. Our fax number is [phone].  If you have an urgent issue when the clinic is closed that cannot wait until the next business day, you can page your doctor at the number below.    Please note that while we do our best to be available for urgent issues outside of office hours, we are not available 24/7.   If you have an urgent issue and are unable to reach [place], you may choose to seek medical care at your doctor's office, retail clinic, urgent care center, or emergency room.  If you have a medical emergency, please immediately call 911 or go to the emergency department.  Pager Numbers  - Dr. Nehemiah Massed: [phone]  - Dr. Laurence Ferrari: [phone]  - Dr. Nicole Kindred: [phone]  In the event of inclement weather, please call our main line at [phone] for an update on the status of any delays or closures.  Dermatology Medication Tips: Please keep the boxes that topical medications come in in order to help keep track of the instructions about where and how to use these. Pharmacies typically print the medication instructions only on the boxes and not directly on the medication tubes.   If your medication is too expensive,  please contact our office at [phone] option 4 or send [place] a message through [place].   We are unable to tell what your co-pay for medications will be in advance as this is different depending on your insurance coverage. However, we may be able to find a substitute medication at lower cost or fill out paperwork to get insurance to cover a needed medication.   If a prior authorization is required to get your medication covered by your insurance company, please allow [place] 1-2 business days to complete this process.  Drug prices often vary depending on where the prescription is filled and some pharmacies may offer cheaper prices.  The website www.goodrx.com contains coupons for medications through different pharmacies. The prices here do not account for what the cost may be with help from insurance (it may be cheaper with your insurance), but the website can give you the price if you did not use any insurance.  - You can print the  associated coupon and take it with your prescription to the pharmacy.  - You may also stop by our office during regular business hours and pick up a GoodRx coupon card.  - If you need your prescription sent electronically to a different pharmacy, notify our office through [facility] or by phone at [phone] option 4.     Si Usted Necesita Algo Despus de Su Visita  Tambin puede enviarnos un mensaje a travs de Pharmacist, community. Por lo general respondemos a los mensajes de MyChart en el transcurso de 1 a 2 das hbiles.  Para renovar recetas, por favor pida a su farmacia que se ponga en contacto con nuestra oficina. Harland Dingwall de fax es [place] [phone].  Si tiene un asunto urgente cuando la clnica est cerrada y que no puede esperar hasta el siguiente da hbil, puede llamar/localizar a su doctor(a) al nmero que aparece a continuacin.   Por favor, tenga en cuenta que aunque hacemos todo lo posible para estar disponibles para asuntos urgentes fuera del  horario de [place], no estamos disponibles las 24 horas del da, los 7 das de la [place].   Si tiene un problema urgente y no puede comunicarse con nosotros, puede optar por buscar atencin mdica  en el consultorio de su doctor(a), en una clnica privada, en un centro de atencin urgente o en una sala de emergencias.  Si tiene Engineering geologist, por favor llame inmediatamente al 911 o vaya a la sala de emergencias.  Nmeros de bper  - Dr. Nehemiah Massed: [phone]  - Dra. Moye: [phone]  - Dra. Nicole Kindred: [phone]  En caso de inclemencias del [place], por favor llame a Johnsie Kindred principal al [phone] para una actualizacin sobre el [place] de cualquier retraso o cierre.  Consejos para la medicacin en dermatologa: Por favor, guarde las cajas en las que vienen los medicamentos de uso tpico para ayudarle a seguir las instrucciones sobre dnde y cmo usarlos. Las farmacias generalmente imprimen las instrucciones del medicamento slo en las cajas y no directamente en los tubos del [place].   Si su medicamento es muy caro, por favor, pngase en contacto con Zigmund Daniel llamando al [phone] y presione la opcin 4 o envenos un mensaje a travs de Pharmacist, community.   No podemos decirle cul ser su copago por los medicamentos por adelantado ya que esto es diferente dependiendo de la cobertura de su seguro. Sin embargo, es posible que podamos encontrar un medicamento sustituto a Electrical engineer un formulario para que el seguro cubra el medicamento que se considera necesario.   Si se requiere una autorizacin previa para que su compaa de seguros [place] su medicamento, por favor permtanos de 1 a 2 das hbiles para completar este proceso.  Los precios de los medicamentos varan con frecuencia dependiendo del Environmental consultant de dnde se surte la receta y alguna farmacias pueden ofrecer precios ms baratos.  El sitio web www.goodrx.com tiene cupones para medicamentos de Office manager. Los precios aqu no tienen en cuenta lo que podra costar con la ayuda del seguro (puede ser ms barato con su seguro), pero el sitio web puede darle el precio si no utiliz Research scientist (physical sciences).  - Puede imprimir el cupn correspondiente y llevarlo con su receta a la farmacia.  - Tambin puede pasar por nuestra oficina durante el horario de atencin regular y Charity fundraiser una tarjeta de cupones de GoodRx.  - Si necesita que su receta se enve electrnicamente a Chiropodist, informe a nuestra oficina a  Lawerance Cruel de MyChart de  o por telfono llamando al [phone] y presione la opcin 4.

## 2022-08-15 NOTE — Therapy (Signed)
OUTPATIENT SPEECH LANGUAGE PATHOLOGY TREATMENT NOTE   Patient Name: Kathleen Everett MRN: [id number] DOB:December 08, 1970, 51 y.o., female Today's Date: 08/16/2022  PCP: [name], MD REFERRING PROVIDER: [name], MD   End of Session - 08/16/22 0826     Visit Number 2    Number of Visits 7    Date for SLP Re-Evaluation 09/20/22    Authorization Type [employer]    Progress Note Due on Visit 10    SLP Start Time 0826    SLP Stop Time  0913    SLP Time Calculation (min) 47 min    Activity Tolerance Patient tolerated treatment well              Past Medical History:  Diagnosis Date   Abdominal pain 07/24/2014   Abscess of right hip 05/11/2020   Anemia 01/02/2020   Ankle pain, right 04/24/2014   Breast pain, right 09/08/2014   Chest pain 02/15/2015   Chiari malformation type I 01/26/2014   s/p sgy decompression   Chronic back pain 12/13/2012   Chronic gastritis 07/18/2015   Chronic tension-type headache, intractable 04/24/2015   DDD (degenerative disc disease), lumbar    Diverticulitis    Dysmenorrhea    Dysplastic nevus 10/09/2015   R abdomen - moderate, limited margins free.   Endometriosis    Generalized anxiety disorder 05/01/2015   Has previously seen psychiatry. Having some issues with what she feels is related to stress and anxiety   GERD (gastroesophageal reflux disease)    Hemorrhoids 12/23/2020   Hidradenitis    History of ovarian cyst    Hot flashes 03/12/2015   Hypercholesteremia 01/10/2014   Off lipitor. Elevated cholesterol. Labs discussed. Restart lipitor. Follow lipid panel and liver function tests.   Hyperglycemia 06/24/2019   Major depressive disorder 05/01/2015   Migraine headaches 01/10/2014   Has seen neurology. Topamax.   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 Level is 21.6   Past Surgical History:  Procedure Laterality Date   ABDOMINAL HYSTERECTOMY  03/27/07   laparoscopic   BREAST EXCISIONAL BIOPSY Left 2004   fibroadenoma   CESAREAN SECTION     1999/2004 twins   chiari decompression     COLONOSCOPY WITH PROPOFOL N/A 04/28/2015   Procedure: COLONOSCOPY WITH PROPOFOL;  Surgeon: Manya Silvas, MD;  Location: [facility] ENDOSCOPY;  Service: Endoscopy;  Laterality: N/A;   COLONOSCOPY WITH PROPOFOL N/A 03/19/2021   Procedure: COLONOSCOPY WITH PROPOFOL;  Surgeon: Lesly Rubenstein, MD;  Location: [facility] ENDOSCOPY;  Service: Endoscopy;  Laterality: N/A;   COLPOSCOPY N/A 04/24/2022   Procedure: COLPOSCOPY, ABLATION OF VULVAR TISSUE;  Surgeon: Gillis Ends, MD;  Location: [facility] ORS;  Service: Gynecology;  Laterality: N/A;   CYSTOSCOPY     bladder ulcers   ESOPHAGOGASTRODUODENOSCOPY N/A 04/28/2015   Procedure: ESOPHAGOGASTRODUODENOSCOPY (EGD);  Surgeon: Manya Silvas, MD;  Location: [facility] ENDOSCOPY;  Service: Endoscopy;  Laterality: N/A;   ESOPHAGOGASTRODUODENOSCOPY (EGD) WITH PROPOFOL N/A 03/19/2021  Procedure: ESOPHAGOGASTRODUODENOSCOPY (EGD) WITH PROPOFOL;  Surgeon: Lesly Rubenstein, MD;  Location: [facility] ENDOSCOPY;  Service: Endoscopy;  Laterality: N/A;   HERNIA REPAIR Right 2003   inguinal   INCISION AND DRAINAGE HIP Right 05/11/2020   Procedure: IRRIGATION AND DEBRIDEMENT HIP;  Surgeon: Hessie Knows, MD;  Location: [facility] ORS;  Service: Orthopedics;  Laterality: Right;   OVARIAN CYST REMOVAL  2001   right   TEMPOROMANDIBULAR JOINT SURGERY  2000   TOTAL HIP ARTHROPLASTY Right 12/28/2019   Procedure: TOTAL HIP ARTHROPLASTY ANTERIOR APPROACH;  Surgeon: Hessie Knows, MD;  Location: [facility] ORS;  Service: Orthopedics;  Laterality: Right;   TUBAL LIGATION     Bilateral   VULVA /PERINEUM BIOPSY  04/24/2022   Procedure: VULVAR BIOPSY;  Surgeon: Gillis Ends, MD;  Location: [facility] ORS;  Service: Gynecology;;   Patient Active Problem List   Diagnosis Date Noted   Rash 07/20/2022   Bloating 07/19/2022   Acute cystitis without hematuria 06/12/2022   Vulvar irritation 06/12/2022   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   Ear fullness 02/09/2022   Runny nose 09/30/2021   Dizziness 07/22/2021   Vitamin B12 deficiency    Vaginal irritation 03/03/2021   Soft tissue mass 03/03/2021   Hemorrhoids 12/23/2020   DDD (degenerative disc disease), lumbar 10/03/2020   Abscess of right hip 05/11/2020   Healthcare maintenance 01/02/2020   Anemia 01/02/2020   Status post total hip replacement, 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   Hyperglycemia 06/24/2019   Vaginal bleeding 09/30/2016   Primary osteoarthritis of right hip 01/17/2016   Arnold-Chiari malformation (HCC)    Chronic gastritis 07/18/2015   Depression, major, in remission ([place]) 05/01/2015   Generalized anxiety disorder 05/01/2015   Chronic tension-type headache, intractable 04/24/2015   Dysmenorrhea 04/24/2015   Endometriosis 04/24/2015   Hidradenitis 04/24/2015   Hot flashes 03/12/2015   Chest pain 02/15/2015   BMI 35.0-35.9,adult 10/16/2014   Cough 09/28/2014   Breast pain, right 09/08/2014   Abdominal pain 07/24/2014   GERD (gastroesophageal reflux disease) 04/24/2014   Ankle pain, right 04/24/2014   Vitamin D deficiency 01/18/2014   Diverticulitis 01/10/2014   Hypercholesteremia 01/10/2014   Right hip pain 01/10/2014   Migraine headaches 01/10/2014   Obstructive sleep apnea 01/10/2014   Chronic back pain 12/13/2012   Mitral valve prolapse 12/13/2012    ONSET DATE: referral 07/25/2022   REFERRING DIAG: R41.89 (ICD-10-CM) - Cognitive change  THERAPY DIAG:  Cognitive communication deficit  Expressive  language impairment  Rationale for Evaluation and Treatment Rehabilitation  SUBJECTIVE:   SUBJECTIVE STATEMENT: "I'm a little tired, I didn't sleep well"   PAIN:  Are you having pain? Yes: NPRS scale: 4/10 Pain location: back   OBJECTIVE:    PATIENT REPORTED OUTCOME MEASURES (PROM): Cognitive Function: 60 Most salient points for pt to improve confidence in cognitive function appear to be centered around organization, initiation and completion of daily tasks.  TODAY'S TREATMENT:  08-16-22: SLP facilitated completion of Cog Function PROM, see above. Generated strategy to A in initiation, recall of, and increased participation of desired tasks: generating daily to-do list. Determined structure which will potentially be beneficial for pt, see instructions. Education provided on strategies to aid in organization and implementation of generating new habits. Education on importance of incorporating new external aids into routine to maximize their effectiveness.  Pt verbalizes understanding. Pt generates x4 items to add to daily to-do list with supervision-A from SLP. Teach back of external support targeted during today's session and purpose of the aid with occasional use  of questioning cues.   PATIENT EDUCATION: Education details: see above Person educated: Patient Education method: Customer service manager Education comprehension: verbalized understanding, returned demonstration, and needs further education  GOALS: Goals reviewed with patient? Yes  SHORT TERM GOALS: Target date: 09/06/2022  Pt will demonstrate use of self-selected internal memory strategy during structured task, resulting in 80% accuracy during task completion, with rare min-A. Baseline: Goal status: IN PROGRESS  2.  Pt will complete high level language tasks with 90% accuracy given rare min-A over 2 sessions.  Baseline:  Goal status: IN PROGRESS  3.  Pt will complete meta-cognitive strategy (e.g. goal plan do  review) worksheet to facilitate return to scrapbooking or other pt selected avocational activity with rare min-A Baseline:  Goal status: IN PROGRESS   LONG TERM GOALS: Target date: 09/20/2022  Pt will employ strategies selected in plan for scrapbooking or other avocational activity, reporting successful completion of 2 pages over 1 week period  Baseline:  Goal status: IN PROGRESS  2.  Pt will report generalization of internal memory strategies resulting in subjective perception of reduced memory lapses over 1 week period Baseline:  Goal status: IN PROGRESS  3.  Pt will employ anomia strategies during 30 minute conversation, with no more than 3 verbal cues, resulting coherent discourse sample with SLP Baseline:  Goal status: IN PROGRESS  4.  Pt will use external aid to assist in initiation and recall of desired home based tasks, resulting in completion of x3 pt selected chores or activities on 4 days across 1 week period Baseline:  Goal status: IN PROGRESS  5.  Pt will report subjective perception of improvement re: cognitive functioning via PROM by d/c Baseline:  Goal status: INITIAL  ASSESSMENT:  CLINICAL IMPRESSION: Patient is a 51 y.o. F who was seen today for cognitive linguistic evaluation. Pt reporting change in cognition, ongoing 2+ years per pt report. Pt primary complaints are change in verbal expression and memory. Pt tells SLP she is heavily reliant on phone to recall appointments, medications, conversations, etc. Verbal expression characterized by vague language, disorganization, anomia/dysnomia. Reports attention deficits at baseline. SLP administered the Cognitive Linguistic Quick Test, evidencing mild impairments in memory and language. All other domains within normal limits. Demonstrates awareness, to include self-correction, during assessment. Standardized testing is supportive of pt's reporting of deficits. Informal evaluation further reveals impairment in executive  functioning (organization, planning, slowed processing, and initiation). Pt's cognitive deficits are impacting successful management of schedule, medication, and engagement in previously enjoyed avocational activities such as scrapbooking. SLP recommends skilled ST interventions targeting verbal expression, executive dysfunction, and memory to enhance communication efficacy and confidence, increase participation in needed and desired activities, and in effort to improve pt's QoL.   OBJECTIVE IMPAIRMENTS include attention, memory, executive functioning, and expressive language. These impairments are limiting patient from managing medications, managing appointments, household responsibilities, and effectively communicating at home and in community. Factors affecting potential to achieve goals and functional outcome are  n/a . Patient will benefit from skilled SLP services to address above impairments and improve overall function.  REHAB POTENTIAL: Good  PLAN: SLP FREQUENCY: 1x/week  SLP DURATION: 6 weeks  PLANNED INTERVENTIONS: Language facilitation, Cueing hierachy, Cognitive reorganization, Internal/external aids, Functional tasks, SLP instruction and feedback, Compensatory strategies, and Patient/family education  Su Monks, CCC-SLP 08/16/2022, 9:18 AM

## 2022-08-16 ENCOUNTER — Ambulatory Visit: Payer: Medicare HMO | Admitting: Physical Therapy

## 2022-08-16 ENCOUNTER — Encounter: Payer: Medicare HMO | Admitting: Speech Pathology

## 2022-08-16 ENCOUNTER — Ambulatory Visit: Payer: Medicare HMO | Admitting: Speech Pathology

## 2022-08-16 DIAGNOSIS — F801 Expressive language disorder: Secondary | ICD-10-CM

## 2022-08-16 DIAGNOSIS — R41841 Cognitive communication deficit: Secondary | ICD-10-CM | POA: Diagnosis not present

## 2022-08-16 NOTE — Patient Instructions (Signed)
Daily to-do list  Two categories Nonnegotiable, I WILL do  I think I CAN do & I will try my best to get done   +when am I going to make my list?  You're going to try making your list in the morning   +When you're making your list What is manageable  What is important to me  Friday Sept 29   I will.....  I want to....  Make my bed Start cleaning out my closet  Take dog for a walk   Put clothes in the washing machine     Big, daunting or undesirable tasks: Break them up... it doesn't all need to be done at once

## 2022-08-19 ENCOUNTER — Other Ambulatory Visit: Payer: Self-pay

## 2022-08-19 ENCOUNTER — Encounter: Payer: Self-pay | Admitting: Dermatology

## 2022-08-19 MED ORDER — EUCRISA 2 % EX OINT: TOPICAL_OINTMENT | CUTANEOUS | 2 refills | Status: DC

## 2022-08-19 NOTE — Therapy (Signed)
OUTPATIENT SPEECH LANGUAGE PATHOLOGY TREATMENT NOTE   Patient Name: Kathleen Everett MRN: [id number] DOB:January 16, 1971, 51 y.o., female Today's Date: 08/20/2022  PCP: [name], MD REFERRING PROVIDER: [name], MD   End of Session - 08/20/22 0847     Visit Number 3    Number of Visits 7    Date for SLP Re-Evaluation 09/20/22    Authorization Type [employer]    Progress Note Due on Visit 10    SLP Start Time 0847    SLP Stop Time  0930    SLP Time Calculation (min) 43 min    Activity Tolerance Patient tolerated treatment well               Past Medical History:  Diagnosis Date   Abdominal pain 07/24/2014   Abscess of right hip 05/11/2020   Anemia 01/02/2020   Ankle pain, right 04/24/2014   Breast pain, right 09/08/2014   Chest pain 02/15/2015   Chiari malformation type I 01/26/2014   s/p sgy decompression   Chronic back pain 12/13/2012   Chronic gastritis 07/18/2015   Chronic tension-type headache, intractable 04/24/2015   DDD (degenerative disc disease), lumbar    Diverticulitis    Dysmenorrhea    Dysplastic nevus 10/09/2015   R abdomen - moderate, limited margins free.   Endometriosis    Generalized anxiety disorder 05/01/2015   Has previously seen psychiatry. Having some issues with what she feels is related to stress and anxiety   GERD (gastroesophageal reflux disease)    Hemorrhoids 12/23/2020   Hidradenitis    History of ovarian cyst    Hot flashes 03/12/2015   Hypercholesteremia 01/10/2014   Off lipitor. Elevated cholesterol. Labs discussed. Restart lipitor. Follow lipid panel and liver function tests.   Hyperglycemia 06/24/2019   Major depressive disorder 05/01/2015   Migraine headaches 01/10/2014   Has seen neurology. Topamax.   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 Level is 21.6   Past Surgical History:  Procedure Laterality Date   ABDOMINAL HYSTERECTOMY  03/27/07   laparoscopic   BREAST EXCISIONAL BIOPSY Left 2004   fibroadenoma   CESAREAN SECTION     1999/2004 twins   chiari decompression     COLONOSCOPY WITH PROPOFOL N/A 04/28/2015   Procedure: COLONOSCOPY WITH PROPOFOL;  Surgeon: Manya Silvas, MD;  Location: [facility] ENDOSCOPY;  Service: Endoscopy;  Laterality: N/A;   COLONOSCOPY WITH PROPOFOL N/A 03/19/2021   Procedure: COLONOSCOPY WITH PROPOFOL;  Surgeon: Lesly Rubenstein, MD;  Location: [facility] ENDOSCOPY;  Service: Endoscopy;  Laterality: N/A;   COLPOSCOPY N/A 04/24/2022   Procedure: COLPOSCOPY, ABLATION OF VULVAR TISSUE;  Surgeon: Gillis Ends, MD;  Location: [facility] ORS;  Service: Gynecology;  Laterality: N/A;   CYSTOSCOPY     bladder ulcers   ESOPHAGOGASTRODUODENOSCOPY N/A 04/28/2015   Procedure: ESOPHAGOGASTRODUODENOSCOPY (EGD);  Surgeon: Manya Silvas, MD;  Location: [facility] ENDOSCOPY;  Service: Endoscopy;  Laterality: N/A;   ESOPHAGOGASTRODUODENOSCOPY (EGD) WITH PROPOFOL N/A  03/19/2021   Procedure: ESOPHAGOGASTRODUODENOSCOPY (EGD) WITH PROPOFOL;  Surgeon: Lesly Rubenstein, MD;  Location: [facility] ENDOSCOPY;  Service: Endoscopy;  Laterality: N/A;   HERNIA REPAIR Right 2003   inguinal   INCISION AND DRAINAGE HIP Right 05/11/2020   Procedure: IRRIGATION AND DEBRIDEMENT HIP;  Surgeon: Hessie Knows, MD;  Location: [facility] ORS;  Service: Orthopedics;  Laterality: Right;   OVARIAN CYST REMOVAL  2001   right   TEMPOROMANDIBULAR JOINT SURGERY  2000   TOTAL HIP ARTHROPLASTY Right 12/28/2019   Procedure: TOTAL HIP ARTHROPLASTY ANTERIOR APPROACH;  Surgeon:  Hessie Knows, MD;  Location: [facility] ORS;  Service: Orthopedics;  Laterality: Right;   TUBAL LIGATION     Bilateral   VULVA /PERINEUM BIOPSY  04/24/2022   Procedure: VULVAR BIOPSY;  Surgeon: Gillis Ends, MD;  Location: [facility] ORS;  Service: Gynecology;;   Patient Active Problem List   Diagnosis Date Noted   Rash 07/20/2022   Bloating 07/19/2022   Acute cystitis without hematuria 06/12/2022   Vulvar irritation 06/12/2022   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   Ear fullness 02/09/2022   Runny nose 09/30/2021   Dizziness 07/22/2021   Vitamin B12 deficiency    Vaginal irritation 03/03/2021   Soft tissue mass 03/03/2021   Hemorrhoids 12/23/2020   DDD (degenerative disc disease), lumbar 10/03/2020   Abscess of right hip 05/11/2020   Healthcare maintenance 01/02/2020   Anemia 01/02/2020   Status post total hip replacement, 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   Hyperglycemia 06/24/2019   Vaginal bleeding 09/30/2016   Primary osteoarthritis of right hip 01/17/2016   Arnold-Chiari malformation (HCC)    Chronic gastritis 07/18/2015   Depression, major, in remission ([place]) 05/01/2015   Generalized anxiety disorder 05/01/2015   Chronic tension-type headache, intractable 04/24/2015   Dysmenorrhea 04/24/2015   Endometriosis 04/24/2015   Hidradenitis 04/24/2015   Hot flashes 03/12/2015   Chest pain 02/15/2015   BMI 35.0-35.9,adult 10/16/2014   Cough 09/28/2014   Breast pain, right 09/08/2014   Abdominal pain 07/24/2014   GERD (gastroesophageal reflux disease) 04/24/2014   Ankle pain, right 04/24/2014   Vitamin D deficiency 01/18/2014   Diverticulitis 01/10/2014   Hypercholesteremia 01/10/2014   Right hip pain 01/10/2014   Migraine headaches 01/10/2014   Obstructive sleep apnea 01/10/2014   Chronic back pain 12/13/2012   Mitral valve prolapse 12/13/2012    ONSET DATE: referral 07/25/2022   REFERRING DIAG: R41.89 (ICD-10-CM) - Cognitive change  THERAPY DIAG:  Cognitive communication  deficit  Expressive language impairment  Rationale for Evaluation and Treatment Rehabilitation  SUBJECTIVE:   SUBJECTIVE STATEMENT: Reports did not implement strategies targeted previous session.  PAIN:  Are you having pain? Yes: NPRS scale: 4/10 Pain location: back   OBJECTIVE:   TODAY'S TREATMENT:  08-20-22: Goal-Plan-Do-Review framework was initiated, training a process that improves problem-solving, attention, organization, and self-monitoring. SLP provided education on framework use and purposes, including examples of goals which could be applied to. This framework was applied to the functional need of cleaning out pt's closet, as identified as a priority by pt. Darius Bump required min cues to identify goal in 1st step of framework. The patient required max-A cues to generate plan steps, strategies and tools which may prove beneficial for plan completion. Pt benefited primarily from use of questioning cues. Generated strategies to accommodate pt's distractibility and reduced initiation. Pt verbalizes understanding. SLP assisted pt in ID tools she could use to implement daily to-do list targeted in last session. Of tools discussed, pt decides that a daily planner would be  most facilitative. Will plan to buy prior to next session.   08-16-22: SLP facilitated completion of Cog Function PROM, see above. Generated strategy to A in initiation, recall of, and increased participation of desired tasks: generating daily to-do list. Determined structure which will potentially be beneficial for pt, see instructions. Education provided on strategies to aid in organization and implementation of generating new habits. Education on importance of incorporating new external aids into routine to maximize their effectiveness.  Pt verbalizes understanding. Pt generates x4 items to add to daily to-do list with supervision-A from SLP. Teach back of external support targeted during today's session and purpose of the  aid with occasional use of questioning cues.   PATIENT EDUCATION: Education details: see above Person educated: Patient Education method: Customer service manager Education comprehension: verbalized understanding, returned demonstration, and needs further education  GOALS: Goals reviewed with patient? Yes  SHORT TERM GOALS: Target date: 09/06/2022  Pt will demonstrate use of self-selected internal memory strategy during structured task, resulting in 80% accuracy during task completion, with rare min-A. Baseline: Goal status: IN PROGRESS  2.  Pt will complete high level language tasks with 90% accuracy given rare min-A over 2 sessions.  Baseline:  Goal status: IN PROGRESS  3.  Pt will complete meta-cognitive strategy (e.g. goal plan do review) worksheet to facilitate return to scrapbooking or other pt selected avocational activity with rare min-A Baseline:  Goal status: IN PROGRESS   LONG TERM GOALS: Target date: 09/20/2022  Pt will employ strategies selected in plan for scrapbooking or other avocational activity, reporting successful completion of 2 pages over 1 week period  Baseline:  Goal status: IN PROGRESS  2.  Pt will report generalization of internal memory strategies resulting in subjective perception of reduced memory lapses over 1 week period Baseline:  Goal status: IN PROGRESS  3.  Pt will employ anomia strategies during 30 minute conversation, with no more than 3 verbal cues, resulting coherent discourse sample with SLP Baseline:  Goal status: IN PROGRESS  4.  Pt will use external aid to assist in initiation and recall of desired home based tasks, resulting in completion of x3 pt selected chores or activities on 4 days across 1 week period Baseline:  Goal status: IN PROGRESS  5.  Pt will report subjective perception of improvement re: cognitive functioning via PROM by d/c Baseline:  Goal status: INITIAL  ASSESSMENT:  CLINICAL IMPRESSION: Patient is a  51 y.o. F who was seen today for cognitive linguistic evaluation. Pt reporting change in cognition, ongoing 2+ years per pt report. Pt primary complaints are change in verbal expression and memory. Pt tells SLP she is heavily reliant on phone to recall appointments, medications, conversations, etc. Verbal expression characterized by vague language, disorganization, anomia/dysnomia. Reports attention deficits at baseline. SLP administered the Cognitive Linguistic Quick Test, evidencing mild impairments in memory and language. All other domains within normal limits. Demonstrates awareness, to include self-correction, during assessment. Standardized testing is supportive of pt's reporting of deficits. Informal evaluation further reveals impairment in executive functioning (organization, planning, slowed processing, and initiation). Pt's cognitive deficits are impacting successful management of schedule, medication, and engagement in previously enjoyed avocational activities such as scrapbooking. SLP recommends skilled ST interventions targeting verbal expression, executive dysfunction, and memory to enhance communication efficacy and confidence, increase participation in needed and desired activities, and in effort to improve pt's QoL.   OBJECTIVE IMPAIRMENTS include attention, memory, executive functioning, and expressive language. These impairments are limiting patient from managing medications,  managing appointments, household responsibilities, and effectively communicating at home and in community. Factors affecting potential to achieve goals and functional outcome are  n/a . Patient will benefit from skilled SLP services to address above impairments and improve overall function.  REHAB POTENTIAL: Good  PLAN: SLP FREQUENCY: 1x/week  SLP DURATION: 6 weeks  PLANNED INTERVENTIONS: Language facilitation, Cueing hierachy, Cognitive reorganization, Internal/external aids, Functional tasks, SLP instruction and  feedback, Compensatory strategies, and Patient/family education  Su Monks, CCC-SLP 08/20/2022, 8:48 AM

## 2022-08-19 NOTE — Telephone Encounter (Signed)
Please send in [place] and recommend using Eucrisa and tacrolimus twice a day when hands are flared. Thank you!

## 2022-08-20 ENCOUNTER — Ambulatory Visit: Payer: Medicare HMO | Attending: Internal Medicine | Admitting: Speech Pathology

## 2022-08-20 DIAGNOSIS — F801 Expressive language disorder: Secondary | ICD-10-CM | POA: Insufficient documentation

## 2022-08-20 DIAGNOSIS — R41841 Cognitive communication deficit: Secondary | ICD-10-CM | POA: Insufficient documentation

## 2022-08-20 NOTE — Patient Instructions (Addendum)
GOAL : what do I want to accomplish   PLAN : action steps, strategies I will use, tools which will help me, how long will this take (can set a deadline)  DO: do according to your plan  REVIEW: this is where we question- what went well? what was challenging?

## 2022-08-26 ENCOUNTER — Ambulatory Visit
Admission: RE | Admit: 2022-08-26 | Discharge: 2022-08-26 | Disposition: A | Discharge status: Home / Self Care | Payer: Medicare HMO | Source: Ambulatory Visit | Attending: Pain Medicine | Admitting: Pain Medicine

## 2022-08-26 DIAGNOSIS — M5137 Other intervertebral disc degeneration, lumbosacral region: Secondary | ICD-10-CM

## 2022-08-26 DIAGNOSIS — M545 Low back pain, unspecified: Secondary | ICD-10-CM | POA: Diagnosis not present

## 2022-08-26 DIAGNOSIS — M5442 Lumbago with sciatica, left side: Secondary | ICD-10-CM

## 2022-08-26 DIAGNOSIS — M47816 Spondylosis without myelopathy or radiculopathy, lumbar region: Secondary | ICD-10-CM

## 2022-08-26 DIAGNOSIS — M48061 Spinal stenosis, lumbar region without neurogenic claudication: Secondary | ICD-10-CM | POA: Diagnosis not present

## 2022-08-27 ENCOUNTER — Ambulatory Visit: Payer: Medicare HMO | Admitting: Speech Pathology

## 2022-08-27 ENCOUNTER — Encounter: Payer: Self-pay | Admitting: Speech Pathology

## 2022-08-27 DIAGNOSIS — F801 Expressive language disorder: Secondary | ICD-10-CM

## 2022-08-27 DIAGNOSIS — R41841 Cognitive communication deficit: Secondary | ICD-10-CM

## 2022-08-27 NOTE — Patient Instructions (Addendum)
   This week start cleaning out small sections of the closet  Work for 15-20 minutes - use a timer, then stop 2-3x a week  Get a planner or Agenda - get one  To Review: Mom coming over and helping was beneficial try to have mom come a couple more time to work in Psychiatric nurse talking to physician/counselor about ADD med  Pain, depression, anxiety are all internal distractions  Eliminate TV, radio, appliance noises when you need to [employer] job being face to face for conversations

## 2022-08-27 NOTE — Therapy (Signed)
OUTPATIENT SPEECH LANGUAGE PATHOLOGY TREATMENT NOTE   Patient Name: Kathleen Everett MRN: [id number] DOB:16-Dec-1970, 51 y.o., female Today's Date: 08/27/2022  PCP: [name], MD REFERRING PROVIDER: [name], MD   End of Session - 08/27/22 [id number]     Visit Number 4    Number of Visits 7    Date for SLP Re-Evaluation 09/20/22    Authorization Type [employer]    Progress Note Due on Visit 10    SLP Start Time 0930    SLP Stop Time  [id number]    SLP Time Calculation (min) 45 min    Activity Tolerance Patient tolerated treatment well               Past Medical History:  Diagnosis Date   Abdominal pain 07/24/2014   Abscess of right hip 05/11/2020   Anemia 01/02/2020   Ankle pain, right 04/24/2014   Breast pain, right 09/08/2014   Chest pain 02/15/2015   Chiari malformation type I 01/26/2014   s/p sgy decompression   Chronic back pain 12/13/2012   Chronic gastritis 07/18/2015   Chronic tension-type headache, intractable 04/24/2015   DDD (degenerative disc disease), lumbar    Diverticulitis    Dysmenorrhea    Dysplastic nevus 10/09/2015   R abdomen - moderate, limited margins free.   Endometriosis    Generalized anxiety disorder 05/01/2015   Has previously seen psychiatry. Having some issues with what she feels is related to stress and anxiety   GERD (gastroesophageal reflux disease)    Hemorrhoids 12/23/2020   Hidradenitis    History of ovarian cyst    Hot flashes 03/12/2015   Hypercholesteremia 01/10/2014   Off lipitor. Elevated cholesterol. Labs discussed. Restart lipitor. Follow lipid panel and liver function tests.   Hyperglycemia 06/24/2019   Major depressive disorder 05/01/2015   Migraine headaches 01/10/2014   Has seen neurology. Topamax.   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 Level is 21.6   Past Surgical History:  Procedure Laterality Date   ABDOMINAL HYSTERECTOMY  03/27/07   laparoscopic   BREAST EXCISIONAL BIOPSY Left 2004   fibroadenoma   CESAREAN SECTION     1999/2004 twins   chiari decompression     COLONOSCOPY WITH PROPOFOL N/A 04/28/2015   Procedure: COLONOSCOPY WITH PROPOFOL;  Surgeon: Manya Silvas, MD;  Location: [facility] ENDOSCOPY;  Service: Endoscopy;  Laterality: N/A;   COLONOSCOPY WITH PROPOFOL N/A 03/19/2021   Procedure: COLONOSCOPY WITH PROPOFOL;  Surgeon: Lesly Rubenstein, MD;  Location: [facility] ENDOSCOPY;  Service: Endoscopy;  Laterality: N/A;   COLPOSCOPY N/A 04/24/2022   Procedure: COLPOSCOPY, ABLATION OF VULVAR TISSUE;  Surgeon: Gillis Ends, MD;  Location: [facility] ORS;  Service: Gynecology;  Laterality: N/A;   CYSTOSCOPY     bladder ulcers   ESOPHAGOGASTRODUODENOSCOPY N/A 04/28/2015   Procedure: ESOPHAGOGASTRODUODENOSCOPY (EGD);  Surgeon: Manya Silvas, MD;  Location: [facility] ENDOSCOPY;  Service: Endoscopy;  Laterality: N/A;   ESOPHAGOGASTRODUODENOSCOPY (EGD) WITH PROPOFOL N/A  03/19/2021   Procedure: ESOPHAGOGASTRODUODENOSCOPY (EGD) WITH PROPOFOL;  Surgeon: Lesly Rubenstein, MD;  Location: [facility] ENDOSCOPY;  Service: Endoscopy;  Laterality: N/A;   HERNIA REPAIR Right 2003   inguinal   INCISION AND DRAINAGE HIP Right 05/11/2020   Procedure: IRRIGATION AND DEBRIDEMENT HIP;  Surgeon: Hessie Knows, MD;  Location: [facility] ORS;  Service: Orthopedics;  Laterality: Right;   OVARIAN CYST REMOVAL  2001   right   TEMPOROMANDIBULAR JOINT SURGERY  2000   TOTAL HIP ARTHROPLASTY Right 12/28/2019   Procedure: TOTAL HIP ARTHROPLASTY ANTERIOR APPROACH;  Surgeon:  Hessie Knows, MD;  Location: [facility] ORS;  Service: Orthopedics;  Laterality: Right;   TUBAL LIGATION     Bilateral   VULVA /PERINEUM BIOPSY  04/24/2022   Procedure: VULVAR BIOPSY;  Surgeon: Gillis Ends, MD;  Location: [facility] ORS;  Service: Gynecology;;   Patient Active Problem List   Diagnosis Date Noted   Rash 07/20/2022   Bloating 07/19/2022   Acute cystitis without hematuria 06/12/2022   Vulvar irritation 06/12/2022   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   Ear fullness 02/09/2022   Runny nose 09/30/2021   Dizziness 07/22/2021   Vitamin B12 deficiency    Vaginal irritation 03/03/2021   Soft tissue mass 03/03/2021   Hemorrhoids 12/23/2020   DDD (degenerative disc disease), lumbar 10/03/2020   Abscess of right hip 05/11/2020   Healthcare maintenance 01/02/2020   Anemia 01/02/2020   Status post total hip replacement, 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   Hyperglycemia 06/24/2019   Vaginal bleeding 09/30/2016   Primary osteoarthritis of right hip 01/17/2016   Arnold-Chiari malformation (HCC)    Chronic gastritis 07/18/2015   Depression, major, in remission ([place]) 05/01/2015   Generalized anxiety disorder 05/01/2015   Chronic tension-type headache, intractable 04/24/2015   Dysmenorrhea 04/24/2015   Endometriosis 04/24/2015   Hidradenitis 04/24/2015   Hot flashes 03/12/2015   Chest pain 02/15/2015   BMI 35.0-35.9,adult 10/16/2014   Cough 09/28/2014   Breast pain, right 09/08/2014   Abdominal pain 07/24/2014   GERD (gastroesophageal reflux disease) 04/24/2014   Ankle pain, right 04/24/2014   Vitamin D deficiency 01/18/2014   Diverticulitis 01/10/2014   Hypercholesteremia 01/10/2014   Right hip pain 01/10/2014   Migraine headaches 01/10/2014   Obstructive sleep apnea 01/10/2014   Chronic back pain 12/13/2012   Mitral valve prolapse 12/13/2012    ONSET DATE: referral 07/25/2022   REFERRING DIAG: R41.89 (ICD-10-CM) - Cognitive change  THERAPY DIAG:  Cognitive communication  deficit  Expressive language impairment  Rationale for Evaluation and Treatment Rehabilitation  SUBJECTIVE:   SUBJECTIVE STATEMENT: "I fell on Thursday and hurt my shoulder".  PAIN:  Are you having pain? Yes: NPRS scale: 5/10 Pain location: back , right shoulder Pain worse today due to pain   OBJECTIVE:   TODAY'S TREATMENT:   08-27-22: Darius Bump hung her clothes as step one in Goal Plan Do Review. She ID'd her next step to go through her clothes. Review with questioning cues she ID'd that having her mom help her was beneficial. She verbalized that her closet was more functional because she can walk into it. Tyjae Shvartsman also endorsed strategy of having her mom help her with the next step in her closet organization. Instructed her to use a timer for 15-20 minutes to work on 1 section at a time. I suggested she work on this 3x this week, she agreed to 2 15 minute sessions in closet. Anjali Manzella completed complex naming task, generating 1 items in  24 categories with given letter, she  named 19/24 with occasional min semantic cues for 4 categories.   08-20-22: Goal-Plan-Do-Review framework was initiated, training a process that improves problem-solving, attention, organization, and self-monitoring. SLP provided education on framework use and purposes, including examples of goals which could be applied to. This framework was applied to the functional need of cleaning out pt's closet, as identified as a priority by pt. Darius Bump required min cues to identify goal in 1st step of framework. The patient required max-A cues to generate plan steps, strategies and tools which may prove beneficial for plan completion. Pt benefited primarily from use of questioning cues. Generated strategies to accommodate pt's distractibility and reduced initiation. Pt verbalizes understanding. SLP assisted pt in ID tools she could use to implement daily to-do list targeted in last session. Of tools discussed, pt decides that a  daily planner would be most facilitative. Will plan to buy prior to next session.   08-16-22: SLP facilitated completion of Cog Function PROM, see above. Generated strategy to A in initiation, recall of, and increased participation of desired tasks: generating daily to-do list. Determined structure which will potentially be beneficial for pt, see instructions. Education provided on strategies to aid in organization and implementation of generating new habits. Education on importance of incorporating new external aids into routine to maximize their effectiveness.  Pt verbalizes understanding. Pt generates x4 items to add to daily to-do list with supervision-A from SLP. Teach back of external support targeted during today's session and purpose of the aid with occasional use of questioning cues.   PATIENT EDUCATION: Education details: see above Person educated: Patient Education method: Customer service manager Education comprehension: verbalized understanding, returned demonstration, and needs further education  GOALS: Goals reviewed with patient? Yes  SHORT TERM GOALS: Target date: 09/06/2022  Pt will demonstrate use of self-selected internal memory strategy during structured task, resulting in 80% accuracy during task completion, with rare min-A. Baseline: Goal status: IN PROGRESS  2.  Pt will complete high level language tasks with 90% accuracy given rare min-A over 2 sessions.  Baseline:  Goal status: IN PROGRESS  3.  Pt will complete meta-cognitive strategy (e.g. goal plan do review) worksheet to facilitate return to scrapbooking or other pt selected avocational activity with rare min-A Baseline:  Goal status: IN PROGRESS   LONG TERM GOALS: Target date: 09/20/2022  Pt will employ strategies selected in plan for scrapbooking or other avocational activity, reporting successful completion of 2 pages over 1 week period  Baseline:  Goal status: IN PROGRESS  2.  Pt will report  generalization of internal memory strategies resulting in subjective perception of reduced memory lapses over 1 week period Baseline:  Goal status: IN PROGRESS  3.  Pt will employ anomia strategies during 30 minute conversation, with no more than 3 verbal cues, resulting coherent discourse sample with SLP Baseline:  Goal status: IN PROGRESS  4.  Pt will use external aid to assist in initiation and recall of desired home based tasks, resulting in completion of x3 pt selected chores or activities on 4 days across 1 week period Baseline:  Goal status: IN PROGRESS  5.  Pt will report subjective perception of improvement re: cognitive functioning via PROM by d/c Baseline:  Goal status: INITIAL  ASSESSMENT:  CLINICAL IMPRESSION: Patient is a 51 y.o. F who was seen today for cognitive linguistic evaluation. Pt reporting change in cognition, ongoing 2+ years per pt report. Pt primary complaints are change in verbal expression and memory. Pt tells SLP she is  heavily reliant on phone to recall appointments, medications, conversations, etc. Verbal expression characterized by vague language, disorganization, anomia/dysnomia. Reports attention deficits at baseline. SLP administered the Cognitive Linguistic Quick Test, evidencing mild impairments in memory and language. All other domains within normal limits. Demonstrates awareness, to include self-correction, during assessment. Standardized testing is supportive of pt's reporting of deficits. Informal evaluation further reveals impairment in executive functioning (organization, planning, slowed processing, and initiation). Pt's cognitive deficits are impacting successful management of schedule, medication, and engagement in previously enjoyed avocational activities such as scrapbooking. SLP recommends skilled ST interventions targeting verbal expression, executive dysfunction, and memory to enhance communication efficacy and confidence, increase participation  in needed and desired activities, and in effort to improve pt's QoL.   OBJECTIVE IMPAIRMENTS include attention, memory, executive functioning, and expressive language. These impairments are limiting patient from managing medications, managing appointments, household responsibilities, and effectively communicating at home and in community. Factors affecting potential to achieve goals and functional outcome are  n/a . Patient will benefit from skilled SLP services to address above impairments and improve overall function.  REHAB POTENTIAL: Good  PLAN: SLP FREQUENCY: 1x/week  SLP DURATION: 6 weeks  PLANNED INTERVENTIONS: Language facilitation, Cueing hierachy, Cognitive reorganization, Internal/external aids, Functional tasks, SLP instruction and feedback, Compensatory strategies, and Patient/family education  Alice Rieger Annye Rusk, CCC-SLP 08/27/2022, 12:23 PM

## 2022-09-03 ENCOUNTER — Ambulatory Visit: Payer: Medicare HMO | Admitting: Speech Pathology

## 2022-09-03 NOTE — Therapy (Unsigned)
OUTPATIENT SPEECH LANGUAGE PATHOLOGY TREATMENT NOTE   Patient Name: Kathleen Everett MRN: [id number] DOB:[birth year], 51 y.o., female Today's Date: 09/04/2022  PCP: [name], MD REFERRING PROVIDER: [name], MD   End of Session - 09/04/22 0800     Visit Number 5    Number of Visits 7    Date for SLP Re-Evaluation 09/20/22    Authorization Type [employer]    Progress Note Due on Visit 10    SLP Start Time 0800    SLP Stop Time  0845    SLP Time Calculation (min) 45 min    Activity Tolerance Patient tolerated treatment well                Past Medical History:  Diagnosis Date   Abdominal pain 07/24/2014   Abscess of right hip 05/11/2020   Anemia 01/02/2020   Ankle pain, right 04/24/2014   Breast pain, right 09/08/2014   Chest pain 02/15/2015   Chiari malformation type I 01/26/2014   s/p sgy decompression   Chronic back pain 12/13/2012   Chronic gastritis 07/18/2015   Chronic tension-type headache, intractable 04/24/2015   DDD (degenerative disc disease), lumbar    Diverticulitis    Dysmenorrhea    Dysplastic nevus 10/09/2015   R abdomen - moderate, limited margins free.   Endometriosis    Generalized anxiety disorder 05/01/2015   Has previously seen psychiatry. Having some issues with what she feels is related to stress and anxiety   GERD (gastroesophageal reflux disease)    Hemorrhoids 12/23/2020   Hidradenitis    History of ovarian cyst    Hot flashes 03/12/2015   Hypercholesteremia 01/10/2014   Off lipitor. Elevated cholesterol. Labs discussed. Restart lipitor. Follow lipid panel and liver function tests.   Hyperglycemia 06/24/2019   Major depressive disorder 05/01/2015   Migraine headaches 01/10/2014   Has seen neurology. Topamax.   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 Level is 21.6   Past Surgical History:  Procedure Laterality Date   ABDOMINAL HYSTERECTOMY  03/27/07   laparoscopic   BREAST EXCISIONAL BIOPSY Left 2004   fibroadenoma   CESAREAN SECTION     1999/2004 twins   chiari decompression     COLONOSCOPY WITH PROPOFOL N/A 04/28/2015   Procedure: COLONOSCOPY WITH PROPOFOL;  Surgeon: Manya Silvas, MD;  Location: [facility] ENDOSCOPY;  Service: Endoscopy;  Laterality: N/A;   COLONOSCOPY WITH PROPOFOL N/A 03/19/2021   Procedure: COLONOSCOPY WITH PROPOFOL;  Surgeon: Lesly Rubenstein, MD;  Location: [facility] ENDOSCOPY;  Service: Endoscopy;  Laterality: N/A;   COLPOSCOPY N/A 04/24/2022   Procedure: COLPOSCOPY, ABLATION OF VULVAR TISSUE;  Surgeon: Gillis Ends, MD;  Location: [facility] ORS;  Service: Gynecology;  Laterality: N/A;   CYSTOSCOPY     bladder ulcers   ESOPHAGOGASTRODUODENOSCOPY N/A 04/28/2015   Procedure: ESOPHAGOGASTRODUODENOSCOPY (EGD);  Surgeon: Manya Silvas, MD;  Location: [facility] ENDOSCOPY;  Service: Endoscopy;  Laterality: N/A;   ESOPHAGOGASTRODUODENOSCOPY (EGD) WITH PROPOFOL  N/A 03/19/2021   Procedure: ESOPHAGOGASTRODUODENOSCOPY (EGD) WITH PROPOFOL;  Surgeon: Lesly Rubenstein, MD;  Location: [facility] ENDOSCOPY;  Service: Endoscopy;  Laterality: N/A;   HERNIA REPAIR Right 2003   inguinal   INCISION AND DRAINAGE HIP Right 05/11/2020   Procedure: IRRIGATION AND DEBRIDEMENT HIP;  Surgeon: Hessie Knows, MD;  Location: [facility] ORS;  Service: Orthopedics;  Laterality: Right;   OVARIAN CYST REMOVAL  2001   right   TEMPOROMANDIBULAR JOINT SURGERY  2000   TOTAL HIP ARTHROPLASTY Right 12/28/2019   Procedure: TOTAL HIP ARTHROPLASTY ANTERIOR APPROACH;  Surgeon:  Hessie Knows, MD;  Location: [facility] ORS;  Service: Orthopedics;  Laterality: Right;   TUBAL LIGATION     Bilateral   VULVA /PERINEUM BIOPSY  04/24/2022   Procedure: VULVAR BIOPSY;  Surgeon: Gillis Ends, MD;  Location: [facility] ORS;  Service: Gynecology;;   Patient Active Problem List   Diagnosis Date Noted   Rash 07/20/2022   Bloating 07/19/2022   Acute cystitis without hematuria 06/12/2022   Vulvar irritation 06/12/2022   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   Ear fullness 02/09/2022   Runny nose 09/30/2021   Dizziness 07/22/2021   Vitamin B12 deficiency    Vaginal irritation 03/03/2021   Soft tissue mass 03/03/2021   Hemorrhoids 12/23/2020   DDD (degenerative disc disease), lumbar 10/03/2020   Abscess of right hip 05/11/2020   Healthcare maintenance 01/02/2020   Anemia 01/02/2020   Status post total hip replacement, 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   Hyperglycemia 06/24/2019   Vaginal bleeding 09/30/2016   Primary osteoarthritis of right hip 01/17/2016   Arnold-Chiari malformation (HCC)    Chronic gastritis 07/18/2015   Depression, major, in remission ([place]) 05/01/2015   Generalized anxiety disorder 05/01/2015   Chronic tension-type headache, intractable 04/24/2015   Dysmenorrhea 04/24/2015   Endometriosis 04/24/2015   Hidradenitis 04/24/2015   Hot flashes 03/12/2015   Chest pain 02/15/2015   BMI 35.0-35.9,adult 10/16/2014   Cough 09/28/2014   Breast pain, right 09/08/2014   Abdominal pain 07/24/2014   GERD (gastroesophageal reflux disease) 04/24/2014   Ankle pain, right 04/24/2014   Vitamin D deficiency 01/18/2014   Diverticulitis 01/10/2014   Hypercholesteremia 01/10/2014   Right hip pain 01/10/2014   Migraine headaches 01/10/2014   Obstructive sleep apnea 01/10/2014   Chronic back pain 12/13/2012   Mitral valve prolapse 12/13/2012    ONSET DATE: referral 07/25/2022   REFERRING DIAG: R41.89 (ICD-10-CM) - Cognitive change  THERAPY DIAG:  Cognitive communication  deficit  Expressive language impairment  Rationale for Evaluation and Treatment Rehabilitation  SUBJECTIVE:   SUBJECTIVE STATEMENT: "it's a little better" re: personal goal of closet clean out  PAIN:  Are you having pain? Yes: NPRS scale: 4/10 Pain location: back , right shoulder   OBJECTIVE:   TODAY'S TREATMENT:   09-04-22: Juda Lajeunesse reports ongoing decreased motivation and initiation and feeling of overwhelm when thinking about tasks she needs to complete. SLP facilitated discussion re: factors which influence motivation and overwhelm with pt ID anxiety/depression, lack of A. Re-education about planning ahead for complex or lengthy tasks to A in completion. For example, choosing to clean out items for attic on weekend when someone is home to move them into the attic. Pt verbalizes understanding. Education on strategies to aid in processing and producing coherent discourse: pacing conversation, maintaining involvement, asking for extra time, using anomia compensations. Generated home practice recommendation which can leverage interest in motivation to work on discourse level language, word finding, task accomplishment, and long term memory.   10-10-23Marliss Czar  Ann hung her clothes as step one in Goal Plan Do Review. She ID'd her next step to go through her clothes. Review with questioning cues she ID'd that having her mom help her was beneficial. She verbalized that her closet was more functional because she can walk into it. Matsue Strom also endorsed strategy of having her mom help her with the next step in her closet organization. Instructed her to use a timer for 15-20 minutes to work on 1 section at a time. I suggested she work on this 3x this week, she agreed to 2 15 minute sessions in closet. Sophiana Milanese completed complex naming task, generating 1 items in  24 categories with given letter, she named 19/24 with occasional min semantic cues for 4 categories.   08-20-22: Goal-Plan-Do-Review  framework was initiated, training a process that improves problem-solving, attention, organization, and self-monitoring. SLP provided education on framework use and purposes, including examples of goals which could be applied to. This framework was applied to the functional need of cleaning out pt's closet, as identified as a priority by pt. Darius Bump required min cues to identify goal in 1st step of framework. The patient required max-A cues to generate plan steps, strategies and tools which may prove beneficial for plan completion. Pt benefited primarily from use of questioning cues. Generated strategies to accommodate pt's distractibility and reduced initiation. Pt verbalizes understanding. SLP assisted pt in ID tools she could use to implement daily to-do list targeted in last session. Of tools discussed, pt decides that a daily planner would be most facilitative. Will plan to buy prior to next session.   08-16-22: SLP facilitated completion of Cog Function PROM, see above. Generated strategy to A in initiation, recall of, and increased participation of desired tasks: generating daily to-do list. Determined structure which will potentially be beneficial for pt, see instructions. Education provided on strategies to aid in organization and implementation of generating new habits. Education on importance of incorporating new external aids into routine to maximize their effectiveness.  Pt verbalizes understanding. Pt generates x4 items to add to daily to-do list with supervision-A from SLP. Teach back of external support targeted during today's session and purpose of the aid with occasional use of questioning cues.   PATIENT EDUCATION: Education details: see above Person educated: Patient Education method: Customer service manager Education comprehension: verbalized understanding, returned demonstration, and needs further education  GOALS: Goals reviewed with patient? Yes  SHORT TERM GOALS: Target  date: 09/06/2022  Pt will demonstrate use of self-selected internal memory strategy during structured task, resulting in 80% accuracy during task completion, with rare min-A. Baseline: Goal status: IN PROGRESS  2.  Pt will complete high level language tasks with 90% accuracy given rare min-A over 2 sessions.  Baseline: 09/04/22 Goal status: MET  3.  Pt will complete meta-cognitive strategy (e.g. goal plan do review) worksheet to facilitate return to scrapbooking or other pt selected avocational activity with rare min-A Baseline: 09-04-22 Goal status: MET   LONG TERM GOALS: Target date: 09/20/2022  Pt will employ strategies selected in plan for scrapbooking or other avocational activity, reporting successful completion of 2 pages over 1 week period  Baseline:  Goal status: IN PROGRESS  2.  Pt will report generalization of internal memory strategies resulting in subjective perception of reduced memory lapses over 1 week period Baseline:  Goal status: IN PROGRESS  3.  Pt will employ anomia strategies during 30 minute conversation, with no more than 3 verbal cues, resulting coherent  discourse sample with SLP Baseline:  Goal status: IN PROGRESS  4.  Pt will use external aid to assist in initiation and recall of desired home based tasks, resulting in completion of x3 pt selected chores or activities on 4 days across 1 week period Baseline:  Goal status: IN PROGRESS  5.  Pt will report subjective perception of improvement re: cognitive functioning via PROM by d/c Baseline:  Goal status: INITIAL  ASSESSMENT:  CLINICAL IMPRESSION: Patient is a 51 y.o. F who was seen today for cognitive linguistic evaluation. Pt reporting change in cognition, ongoing 2+ years per pt report. Pt primary complaints are change in verbal expression and memory. Pt tells SLP she is heavily reliant on phone to recall appointments, medications, conversations, etc. Verbal expression characterized by vague  language, disorganization, anomia/dysnomia. Reports attention deficits at baseline. SLP administered the Cognitive Linguistic Quick Test, evidencing mild impairments in memory and language. All other domains within normal limits. Demonstrates awareness, to include self-correction, during assessment. Standardized testing is supportive of pt's reporting of deficits. Informal evaluation further reveals impairment in executive functioning (organization, planning, slowed processing, and initiation). Pt's cognitive deficits are impacting successful management of schedule, medication, and engagement in previously enjoyed avocational activities such as scrapbooking. SLP recommends skilled ST interventions targeting verbal expression, executive dysfunction, and memory to enhance communication efficacy and confidence, increase participation in needed and desired activities, and in effort to improve pt's QoL.   OBJECTIVE IMPAIRMENTS include attention, memory, executive functioning, and expressive language. These impairments are limiting patient from managing medications, managing appointments, household responsibilities, and effectively communicating at home and in community. Factors affecting potential to achieve goals and functional outcome are  n/a . Patient will benefit from skilled SLP services to address above impairments and improve overall function.  REHAB POTENTIAL: Good  PLAN: SLP FREQUENCY: 1x/week  SLP DURATION: 6 weeks  PLANNED INTERVENTIONS: Language facilitation, Cueing hierachy, Cognitive reorganization, Internal/external aids, Functional tasks, SLP instruction and feedback, Compensatory strategies, and Patient/family education  Su Monks, CCC-SLP 09/04/2022, 8:00 AM

## 2022-09-04 ENCOUNTER — Ambulatory Visit: Payer: Medicare HMO | Admitting: Speech Pathology

## 2022-09-04 DIAGNOSIS — R41841 Cognitive communication deficit: Secondary | ICD-10-CM | POA: Diagnosis not present

## 2022-09-04 DIAGNOSIS — F801 Expressive language disorder: Secondary | ICD-10-CM

## 2022-09-04 NOTE — Patient Instructions (Signed)
Describing words  What group does it belong to?  What do I use it for?  Where can I find it?  What does it LOOK like?  What other words go with it?  What is the 1st sound of the word?  Link it to related items

## 2022-09-05 ENCOUNTER — Telehealth (HOSPITAL_BASED_OUTPATIENT_CLINIC_OR_DEPARTMENT_OTHER): Payer: Medicare HMO | Admitting: Psychiatry

## 2022-09-05 DIAGNOSIS — F5105 Insomnia due to other mental disorder: Secondary | ICD-10-CM

## 2022-09-05 DIAGNOSIS — F331 Major depressive disorder, recurrent, moderate: Secondary | ICD-10-CM

## 2022-09-05 DIAGNOSIS — F411 Generalized anxiety disorder: Secondary | ICD-10-CM

## 2022-09-05 DIAGNOSIS — F99 Mental disorder, not otherwise specified: Secondary | ICD-10-CM | POA: Diagnosis not present

## 2022-09-05 MED ORDER — SERTRALINE HCL 100 MG PO TABS: 150.0000 mg | ORAL_TABLET | Freq: Every day | ORAL | 0 refills | Status: DC

## 2022-09-05 NOTE — Progress Notes (Signed)
Virtual Visit via Video Note  I connected with Kathleen Everett on 09/05/22 at  9:30 AM EDT by   a video enabled telemedicine application and verified that I am speaking with the correct person using two identifiers.  Location: Patient: Home Provider: office   I discussed the limitations of evaluation and management by telemedicine and the availability of in person appointments. The patient expressed understanding and agreed to proceed.  History of Present Illness: Kathleen Everett stopped taking the Lithium about 1 month ago due to increased frequency of  headaches. She forget to tell me. The headaches are back to her normal range (3-5 days/week). The depression is ongoing. She has good days and other days that she feels "off". She gets stressed out with money and other things happening in the house. It makes her feel depressed that she can't do the things she used to or wants to. She doesn't have the energy or motivation to do a lot. Kathleen Everett has some negative self talk but it is not as severe as before. She denies SI/HI. Kathleen Everett is going thru speech therapy and her therapist recommended she talk to me about restarting a stimulant. Kathleen Everett states it is hard to get her thoughts organized and express herself. She still tends to over think things. Her anxiety is pretty high about 4/7 days a week depending on what is happening in the house. She will use other coping skills and that helps to decrease it some. Kathleen Everett is not sleeping well. She has racing thoughts and can't fall asleep or she will wake up due to pain. She will usually get about 3 hrs of sleep at a time.   Observations/Objective: Psychiatric Specialty Exam: ROS  Last menstrual period 12/08/2006.There is no height or weight on file to calculate BMI.  General Appearance: Casual and Fairly Groomed  Eye Contact:  Good  Speech:  Clear and Coherent and Normal Rate  Volume:  Normal  Mood:  Depressed  Affect:  Congruent  Thought  Process:  Goal Directed, Linear, and Descriptions of Associations: Intact  Orientation:  Full (Time, Place, and Person)  Thought Content:  Logical  Suicidal Thoughts:  No  Homicidal Thoughts:  No  Memory:  Immediate;   Good  Judgement:  Good  Insight:  Good  Psychomotor Activity:  Normal  Concentration:  Concentration: Good  Recall:  Good  Fund of Knowledge:  Good  Language:  Good  Akathisia:  No  Handed:  Right  AIMS (if indicated):     Assets:  Communication Skills Desire for Improvement Financial Resources/Insurance Housing Leisure Time Resilience Social Support Talents/Skills Transportation Vocational/Educational  ADL's:  Intact  Cognition:  WNL  Sleep:        Assessment and Plan:     09/05/2022    9:44 AM 07/19/2022    9:41 AM 07/19/2022    9:06 AM 06/13/2022   11:11 AM 05/23/2022    2:16 PM  Depression screen PHQ 2/9  Decreased Interest '2 2 2 3 3  '$ Down, Depressed, Hopeless '2 3 3 3 3  '$ PHQ - 2 Score '4 5 5 6 6  '$ Altered sleeping '3 2  2 2  '$ Tired, decreased energy '3 3  3 3  '$ Change in appetite 0 0  2 0  Feeling bad or failure about yourself  '1 3  3 3  '$ Trouble concentrating '3 3  3 3  '$ Moving slowly or fidgety/restless 0 2  0 0  Suicidal thoughts 0 0  0 0  PHQ-9 Score '14 18  19 17  '$ Difficult doing work/chores Very difficult Extremely dIfficult  Extremely dIfficult Extremely dIfficult    Flowsheet Row Video Visit from 09/05/2022 in [facility] Video Visit from 06/13/2022 in [facility] Video Visit from 05/23/2022 in [place] No Risk No Risk No Risk         Pt is aware that these meds carry a teratogenic risk. Pt will discuss plan of action if she does or plans to become pregnant in the future.  Status of current problems: ongoing depression and insomnia  Meds: Keith Cancio is taking Topamax, Robaxin and Percocet which could  contribute to fatigue and poor memory and concentration  She has not taken Ativan in several months  D/C Lithium as she is not taking it  Increase Zoloft to '150mg'$  po qD. She denies any GI upset or diarrhea with Zoloft use.   1. MDD (major depressive disorder), recurrent episode, moderate (HCC) - sertraline (ZOLOFT) 100 MG tablet; Take 1.5 tablets (150 mg total) by mouth daily.  Dispense: 135 tablet; Refill: 0  2. Generalized anxiety disorder - sertraline (ZOLOFT) 100 MG tablet; Take 1.5 tablets (150 mg total) by mouth daily.  Dispense: 135 tablet; Refill: 0  3. Insomnia due to other mental disorder     Labs: none    Therapy: brief supportive therapy provided. Discussed psychosocial stressors in detail.     Collaboration of Care: Other none  Patient/Guardian was advised Release of Information must be obtained prior to any record release in order to collaborate their care with an outside provider. Patient/Guardian was advised if they have not already done so to contact the registration department to sign all necessary forms in order for [place] to release information regarding their care.   Consent: Patient/Guardian gives verbal consent for treatment and assignment of benefits for services provided during this visit. Patient/Guardian expressed understanding and agreed to proceed.      Follow Up Instructions: Follow up in 1-2 months or sooner if needed    I discussed the assessment and treatment plan with the patient. The patient was provided an opportunity to ask questions and all were answered. The patient agreed with the plan and demonstrated an understanding of the instructions.   The patient was advised to call back or seek an in-person evaluation if the symptoms worsen or if the condition fails to improve as anticipated.  I provided 19 minutes of non-face-to-face time during this encounter.   Charlcie Cradle, MD

## 2022-09-07 ENCOUNTER — Other Ambulatory Visit: Payer: Self-pay | Admitting: Internal Medicine

## 2022-09-10 ENCOUNTER — Ambulatory Visit: Payer: Medicare HMO

## 2022-09-10 DIAGNOSIS — F801 Expressive language disorder: Secondary | ICD-10-CM | POA: Diagnosis not present

## 2022-09-10 DIAGNOSIS — R41841 Cognitive communication deficit: Secondary | ICD-10-CM

## 2022-09-10 NOTE — Therapy (Signed)
OUTPATIENT SPEECH LANGUAGE PATHOLOGY TREATMENT NOTE   Patient Name: Kathleen Everett MRN: [id number] DOB:Dec 23, 1970, 51 y.o., female Today's Date: 09/10/2022  PCP: [name], MD REFERRING PROVIDER: [name], MD   End of Session - 09/10/22 1012     Visit Number 6    Number of Visits 7    Date for SLP Re-Evaluation 09/20/22    SLP Start Time 0930    SLP Stop Time  1013    SLP Time Calculation (min) 43 min    Activity Tolerance Patient tolerated treatment well                 Past Medical History:  Diagnosis Date   Abdominal pain 07/24/2014   Abscess of right hip 05/11/2020   Anemia 01/02/2020   Ankle pain, right 04/24/2014   Breast pain, right 09/08/2014   Chest pain 02/15/2015   Chiari malformation type I 01/26/2014   s/p sgy decompression   Chronic back pain 12/13/2012   Chronic gastritis 07/18/2015   Chronic tension-type headache, intractable 04/24/2015   DDD (degenerative disc disease), lumbar    Diverticulitis    Dysmenorrhea    Dysplastic nevus 10/09/2015   R abdomen - moderate, limited margins free.   Endometriosis    Generalized anxiety disorder 05/01/2015   Has previously seen psychiatry. Having some issues with what she feels is related to stress and anxiety   GERD (gastroesophageal reflux disease)    Hemorrhoids 12/23/2020   Hidradenitis    History of ovarian cyst    Hot flashes 03/12/2015   Hypercholesteremia 01/10/2014   Off lipitor. Elevated cholesterol. Labs discussed. Restart lipitor. Follow lipid panel and liver function tests.   Hyperglycemia 06/24/2019   Major depressive disorder 05/01/2015   Migraine headaches 01/10/2014   Has seen neurology. Topamax.   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 Level is 21.6   Past Surgical History:  Procedure Laterality Date   ABDOMINAL HYSTERECTOMY  03/27/07   laparoscopic   BREAST EXCISIONAL BIOPSY Left 2004   fibroadenoma   CESAREAN SECTION     1999/2004 twins   chiari decompression     COLONOSCOPY WITH PROPOFOL N/A 04/28/2015   Procedure: COLONOSCOPY WITH PROPOFOL;  Surgeon: Manya Silvas, MD;  Location: [facility] ENDOSCOPY;  Service: Endoscopy;  Laterality: N/A;   COLONOSCOPY WITH PROPOFOL N/A 03/19/2021   Procedure: COLONOSCOPY WITH PROPOFOL;  Surgeon: Lesly Rubenstein, MD;  Location: [facility] ENDOSCOPY;  Service: Endoscopy;  Laterality: N/A;   COLPOSCOPY N/A 04/24/2022   Procedure: COLPOSCOPY, ABLATION OF VULVAR TISSUE;  Surgeon: Gillis Ends, MD;  Location: [facility] ORS;  Service: Gynecology;  Laterality: N/A;   CYSTOSCOPY     bladder ulcers   ESOPHAGOGASTRODUODENOSCOPY N/A 04/28/2015   Procedure: ESOPHAGOGASTRODUODENOSCOPY (EGD);  Surgeon: Manya Silvas, MD;  Location: [facility] ENDOSCOPY;  Service: Endoscopy;  Laterality: N/A;   ESOPHAGOGASTRODUODENOSCOPY (EGD) WITH PROPOFOL N/A 03/19/2021   Procedure: ESOPHAGOGASTRODUODENOSCOPY (EGD) WITH PROPOFOL;  Surgeon: Lesly Rubenstein, MD;  Location: [facility] ENDOSCOPY;  Service: Endoscopy;  Laterality: N/A;   HERNIA REPAIR Right 2003   inguinal   INCISION AND DRAINAGE HIP Right 05/11/2020   Procedure: IRRIGATION AND DEBRIDEMENT HIP;  Surgeon: Hessie Knows, MD;  Location: [facility] ORS;  Service: Orthopedics;  Laterality: Right;   OVARIAN CYST REMOVAL  2001   right   TEMPOROMANDIBULAR JOINT SURGERY  2000   TOTAL HIP ARTHROPLASTY Right 12/28/2019   Procedure: TOTAL HIP ARTHROPLASTY ANTERIOR APPROACH;  Surgeon: Hessie Knows, MD;  Location: [facility] ORS;  Service: Orthopedics;   Laterality: Right;   TUBAL LIGATION     Bilateral   VULVA Milagros Loll BIOPSY  04/24/2022   Procedure: VULVAR BIOPSY;  Surgeon: Gillis Ends, MD;  Location: [facility] ORS;  Service: Gynecology;;   Patient Active Problem List   Diagnosis Date Noted   Rash 07/20/2022   Bloating 07/19/2022   Acute cystitis without hematuria 06/12/2022   Vulvar irritation 06/12/2022   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   Ear fullness 02/09/2022   Runny nose 09/30/2021   Dizziness 07/22/2021   Vitamin B12 deficiency    Vaginal irritation 03/03/2021   Soft tissue mass 03/03/2021   Hemorrhoids 12/23/2020   DDD (degenerative disc disease), lumbar 10/03/2020   Abscess of right hip 05/11/2020   Healthcare maintenance 01/02/2020   Anemia 01/02/2020   Status post total hip replacement, 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   Hyperglycemia 06/24/2019   Vaginal bleeding 09/30/2016   Primary osteoarthritis of right hip 01/17/2016   Arnold-Chiari malformation (HCC)    Chronic gastritis 07/18/2015   Depression, major, in remission ([place]) 05/01/2015   Generalized anxiety disorder 05/01/2015   Chronic tension-type headache, intractable 04/24/2015   Dysmenorrhea 04/24/2015   Endometriosis 04/24/2015   Hidradenitis 04/24/2015   Hot flashes 03/12/2015   Chest pain 02/15/2015   BMI 35.0-35.9,adult 10/16/2014   Cough 09/28/2014   Breast pain, right 09/08/2014   Abdominal pain 07/24/2014   GERD (gastroesophageal reflux disease) 04/24/2014   Ankle pain, right 04/24/2014   Vitamin D deficiency 01/18/2014   Diverticulitis 01/10/2014   Hypercholesteremia 01/10/2014   Right hip pain 01/10/2014   Migraine headaches 01/10/2014   Obstructive sleep apnea 01/10/2014   Chronic back pain 12/13/2012   Mitral valve prolapse 12/13/2012    ONSET DATE: referral 07/25/2022   REFERRING DIAG: R41.89 (ICD-10-CM) - Cognitive change  THERAPY DIAG:  Cognitive communication deficit  Rationale for Evaluation and Treatment Rehabilitation  SUBJECTIVE:    SUBJECTIVE STATEMENT: "more headaches. Possibly the change of weather."   PAIN:  Are you having pain? Yes: NPRS scale: 4/10 Pain location: back , right shoulder   OBJECTIVE:   TODAY'S TREATMENT:   09-12-22: Darius Bump support memory issues, long term, short term and working memory due to brain surgery. SLP facilitated working memory and executive function strategies with visualization and note taking in complex scheduling and deductive reasoning tasks. Pt required supervision A verbal cues fade to mod I in 2/3 tasks. SLP provided HEP deductive reasoning exercises and will provide information for HEP apps at d/c for memory and higher level word finding for abstract language.  09-04-22: Avereigh Spainhower reports ongoing decreased motivation and initiation and feeling of overwhelm when thinking about tasks she needs to complete. SLP facilitated discussion re: factors which influence motivation and overwhelm with pt ID anxiety/depression, lack of A. Re-education about planning ahead for complex or lengthy tasks to A in completion. For example, choosing to clean out items for attic on weekend when someone is home to move them into the attic.  Pt verbalizes understanding. Education on strategies to aid in processing and producing coherent discourse: pacing conversation, maintaining involvement, asking for extra time, using anomia compensations. Generated home practice recommendation which can leverage interest in motivation to work on discourse level language, word finding, task accomplishment, and long term memory.   08-27-22: Darius Bump hung her clothes as step one in Goal Plan Do Review. She ID'd her next step to go through her clothes. Review with questioning cues she ID'd that having her mom help her was beneficial. She verbalized that her closet was more functional because she can walk into it. Dazaria Macneill also endorsed strategy of having her mom help her with the next step in her closet organization.  Instructed her to use a timer for 15-20 minutes to work on 1 section at a time. I suggested she work on this 3x this week, she agreed to 2 15 minute sessions in closet. Ceciley Buist completed complex naming task, generating 1 items in  24 categories with given letter, she named 19/24 with occasional min semantic cues for 4 categories.   08-20-22: Goal-Plan-Do-Review framework was initiated, training a process that improves problem-solving, attention, organization, and self-monitoring. SLP provided education on framework use and purposes, including examples of goals which could be applied to. This framework was applied to the functional need of cleaning out pt's closet, as identified as a priority by pt. Darius Bump required min cues to identify goal in 1st step of framework. The patient required max-A cues to generate plan steps, strategies and tools which may prove beneficial for plan completion. Pt benefited primarily from use of questioning cues. Generated strategies to accommodate pt's distractibility and reduced initiation. Pt verbalizes understanding. SLP assisted pt in ID tools she could use to implement daily to-do list targeted in last session. Of tools discussed, pt decides that a daily planner would be most facilitative. Will plan to buy prior to next session.   08-16-22: SLP facilitated completion of Cog Function PROM, see above. Generated strategy to A in initiation, recall of, and increased participation of desired tasks: generating daily to-do list. Determined structure which will potentially be beneficial for pt, see instructions. Education provided on strategies to aid in organization and implementation of generating new habits. Education on importance of incorporating new external aids into routine to maximize their effectiveness.  Pt verbalizes understanding. Pt generates x4 items to add to daily to-do list with supervision-A from SLP. Teach back of external support targeted during today's session  and purpose of the aid with occasional use of questioning cues.   PATIENT EDUCATION: Education details: see above Person educated: Patient Education method: Customer service manager Education comprehension: verbalized understanding, returned demonstration, and needs further education  GOALS: Goals reviewed with patient? Yes  SHORT TERM GOALS: Target date: 09/06/2022  Pt will demonstrate use of self-selected internal memory strategy during structured task, resulting in 80% accuracy during task completion, with rare min-A. Baseline: Goal status: IN PROGRESS  2.  Pt will complete high level language tasks with 90% accuracy given rare min-A over 2 sessions.  Baseline: 09/04/22 Goal status: MET  3.  Pt will complete meta-cognitive strategy (e.g. goal plan do review) worksheet to facilitate return to scrapbooking or other pt selected avocational activity with rare min-A Baseline: 09-04-22 Goal status: MET   LONG TERM GOALS: Target date: 09/20/2022  Pt will employ strategies selected in plan for scrapbooking or other avocational activity, reporting successful completion of 2 pages over 1 week period  Baseline:  Goal  status: IN PROGRESS  2.  Pt will report generalization of internal memory strategies resulting in subjective perception of reduced memory lapses over 1 week period Baseline:  Goal status: IN PROGRESS  3.  Pt will employ anomia strategies during 30 minute conversation, with no more than 3 verbal cues, resulting coherent discourse sample with SLP Baseline:  Goal status: IN PROGRESS  4.  Pt will use external aid to assist in initiation and recall of desired home based tasks, resulting in completion of x3 pt selected chores or activities on 4 days across 1 week period Baseline:  Goal status: IN PROGRESS  5.  Pt will report subjective perception of improvement re: cognitive functioning via PROM by d/c Baseline:  Goal status: INITIAL  ASSESSMENT:  CLINICAL  IMPRESSION: Patient is a 51 y.o. F who was seen today for cognitive linguistic evaluation. Pt reporting change in cognition, ongoing 2+ years per pt report. Pt primary complaints are change in verbal expression and memory. Pt tells SLP she is heavily reliant on phone to recall appointments, medications, conversations, etc. Verbal expression characterized by vague language, disorganization, anomia/dysnomia. Reports attention deficits at baseline. SLP administered the Cognitive Linguistic Quick Test, evidencing mild impairments in memory and language. All other domains within normal limits. Demonstrates awareness, to include self-correction, during assessment. Standardized testing is supportive of pt's reporting of deficits. Informal evaluation further reveals impairment in executive functioning (organization, planning, slowed processing, and initiation). Pt's cognitive deficits are impacting successful management of schedule, medication, and engagement in previously enjoyed avocational activities such as scrapbooking. SLP recommends skilled ST interventions targeting verbal expression, executive dysfunction, and memory to enhance communication efficacy and confidence, increase participation in needed and desired activities, and in effort to improve pt's QoL.   OBJECTIVE IMPAIRMENTS include attention, memory, executive functioning, and expressive language. These impairments are limiting patient from managing medications, managing appointments, household responsibilities, and effectively communicating at home and in community. Factors affecting potential to achieve goals and functional outcome are  n/a . Patient will benefit from skilled SLP services to address above impairments and improve overall function.  REHAB POTENTIAL: Good  PLAN: SLP FREQUENCY: 1x/week  SLP DURATION: 6 weeks  PLANNED INTERVENTIONS: Language facilitation, Cueing hierachy, Cognitive reorganization, Internal/external aids, Functional  tasks, SLP instruction and feedback, Compensatory strategies, and Patient/family education  [place], CCC-SLP 09/10/2022, 10:16 AM

## 2022-09-17 ENCOUNTER — Ambulatory Visit: Payer: Medicare HMO | Admitting: Speech Pathology

## 2022-09-24 DIAGNOSIS — M5137 Other intervertebral disc degeneration, lumbosacral region: Secondary | ICD-10-CM | POA: Diagnosis not present

## 2022-09-24 DIAGNOSIS — M47816 Spondylosis without myelopathy or radiculopathy, lumbar region: Secondary | ICD-10-CM | POA: Diagnosis not present

## 2022-09-24 DIAGNOSIS — M160 Bilateral primary osteoarthritis of hip: Secondary | ICD-10-CM | POA: Diagnosis not present

## 2022-09-24 DIAGNOSIS — M5442 Lumbago with sciatica, left side: Secondary | ICD-10-CM | POA: Diagnosis not present

## 2022-09-24 DIAGNOSIS — G2581 Restless legs syndrome: Secondary | ICD-10-CM | POA: Diagnosis not present

## 2022-09-24 DIAGNOSIS — M5441 Lumbago with sciatica, right side: Secondary | ICD-10-CM | POA: Diagnosis not present

## 2022-09-24 DIAGNOSIS — M5412 Radiculopathy, cervical region: Secondary | ICD-10-CM | POA: Diagnosis not present

## 2022-09-24 DIAGNOSIS — M25551 Pain in right hip: Secondary | ICD-10-CM | POA: Diagnosis not present

## 2022-09-25 ENCOUNTER — Ambulatory Visit: Payer: Medicare HMO | Attending: Internal Medicine | Admitting: Speech Pathology

## 2022-09-25 DIAGNOSIS — F801 Expressive language disorder: Secondary | ICD-10-CM | POA: Diagnosis not present

## 2022-09-25 DIAGNOSIS — R41841 Cognitive communication deficit: Secondary | ICD-10-CM | POA: Diagnosis not present

## 2022-09-25 NOTE — Therapy (Signed)
OUTPATIENT SPEECH LANGUAGE PATHOLOGY TREATMENT NOTE   Patient Name: Kathleen Everett MRN: [id number] DOB:1971/04/02, 51 y.o., female Today's Date: 09/25/2022  PCP: [name], MD REFERRING PROVIDER: [name], MD   End of Session - 09/25/22 1017     Visit Number 7    Number of Visits 7    Date for SLP Re-Evaluation 09/20/22    SLP Start Time 1017    SLP Stop Time  1100    SLP Time Calculation (min) 43 min    Activity Tolerance Patient tolerated treatment well                 Past Medical History:  Diagnosis Date   Abdominal pain 07/24/2014   Abscess of right hip 05/11/2020   Anemia 01/02/2020   Ankle pain, right 04/24/2014   Breast pain, right 09/08/2014   Chest pain 02/15/2015   Chiari malformation type I 01/26/2014   s/p sgy decompression   Chronic back pain 12/13/2012   Chronic gastritis 07/18/2015   Chronic tension-type headache, intractable 04/24/2015   DDD (degenerative disc disease), lumbar    Diverticulitis    Dysmenorrhea    Dysplastic nevus 10/09/2015   R abdomen - moderate, limited margins free.   Endometriosis    Generalized anxiety disorder 05/01/2015   Has previously seen psychiatry. Having some issues with what she feels is related to stress and anxiety   GERD (gastroesophageal reflux disease)    Hemorrhoids 12/23/2020   Hidradenitis    History of ovarian cyst    Hot flashes 03/12/2015   Hypercholesteremia 01/10/2014   Off lipitor. Elevated cholesterol. Labs discussed. Restart lipitor. Follow lipid panel and liver function tests.   Hyperglycemia 06/24/2019   Major depressive disorder 05/01/2015   Migraine headaches 01/10/2014   Has seen neurology. Topamax.   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 Level is 21.6   Past Surgical History:  Procedure Laterality Date   ABDOMINAL HYSTERECTOMY  03/27/07   laparoscopic   BREAST EXCISIONAL BIOPSY Left 2004   fibroadenoma   CESAREAN SECTION     1999/2004 twins   chiari decompression     COLONOSCOPY WITH PROPOFOL N/A 04/28/2015   Procedure: COLONOSCOPY WITH PROPOFOL;  Surgeon: Manya Silvas, MD;  [facility] END[facility]COPY;  Service: Endoscopy;  Laterality: N/A;   COLONOSCOPY WITH PROPOFOL N/A 03/19/2021   Procedure: COLONOSCOPY WITH PROPOFOL;  Surgeon: Lesly Rubenstein, MD;  Location: [facility] ENDOSCOPY;  Service: Endoscopy;  Laterality: N/A;   COLPOSCOPY N/A 04/24/2022   Procedure: COLPOSCOPY, ABLATION OF VULVAR TISSUE;  Surgeon: Gillis Ends, MD;  Location: [facility] ORS;  Service: Gynecology;  Laterality: N/A;   CYSTOSCOPY     bladder ulcers   ESOPHAGOGASTRODUODENOSCOPY N/A 04/28/2015   Procedure: ESOPHAGOGASTRODUODENOSCOPY (EGD);  Surgeon: Manya Silvas, MD;  Location: [facility] ENDOSCOPY;  Service: Endoscopy;  Laterality: N/A;   ESOPHAGOGASTRODUODENOSCOPY (EGD) WITH PROPOFOL N/A 03/19/2021   Procedure: ESOPHAGOGASTRODUODENOSCOPY (EGD) WITH PROPOFOL;  Surgeon: Lesly Rubenstein, MD;  Location: [facility] ENDOSCOPY;  Service: Endoscopy;  Laterality: N/A;   HERNIA REPAIR Right 2003   inguinal   INCISION AND DRAINAGE HIP Right 05/11/2020   Procedure: IRRIGATION AND DEBRIDEMENT HIP;  Surgeon: Hessie Knows, MD;  Location: [facility] ORS;  Service: Orthopedics;  Laterality: Right;   OVARIAN CYST REMOVAL  2001   right   TEMPOROMANDIBULAR JOINT SURGERY  2000   TOTAL HIP ARTHROPLASTY Right 12/28/2019   Procedure: TOTAL HIP ARTHROPLASTY ANTERIOR APPROACH;  Surgeon: Hessie Knows, MD;  Location: [facility] ORS;  Service: Orthopedics;   Laterality: Right;   TUBAL LIGATION     Bilateral   VULVA /PERINEUM BIOPSY  04/24/2022   Procedure: VULVAR BIOPSY;  Surgeon: Gillis Ends, MD;  Location: [facility] ORS;  Service: Gynecology;;   Patient Active Problem List   Diagnosis Date Noted   Rash 07/20/2022   Bloating 07/19/2022   Acute cystitis without hematuria 06/12/2022   Vulvar irritation 06/12/2022   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   Ear fullness 02/09/2022   Runny nose 09/30/2021   Dizziness 07/22/2021   Vitamin B12 deficiency    Vaginal irritation 03/03/2021   Soft tissue mass 03/03/2021   Hemorrhoids 12/23/2020   DDD (degenerative disc disease), lumbar 10/03/2020   Abscess of right hip 05/11/2020   Healthcare maintenance 01/02/2020   Anemia 01/02/2020   Status post total hip replacement, 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   Hyperglycemia 06/24/2019   Vaginal bleeding 09/30/2016   Primary osteoarthritis of right hip 01/17/2016   Arnold-Chiari malformation (HCC)    Chronic gastritis 07/18/2015   Depression, major, in remission ([place]) 05/01/2015   Generalized anxiety disorder 05/01/2015   Chronic tension-type headache, intractable 04/24/2015   Dysmenorrhea 04/24/2015   Endometriosis 04/24/2015   Hidradenitis 04/24/2015   Hot flashes 03/12/2015   Chest pain 02/15/2015   BMI 35.0-35.9,adult 10/16/2014   Cough 09/28/2014   Breast pain, right 09/08/2014   Abdominal pain 07/24/2014   GERD (gastroesophageal reflux disease) 04/24/2014   Ankle pain, right 04/24/2014   Vitamin D deficiency 01/18/2014   Diverticulitis 01/10/2014   Hypercholesteremia 01/10/2014   Right hip pain 01/10/2014   Migraine headaches 01/10/2014   Obstructive sleep apnea 01/10/2014   Chronic back pain 12/13/2012   Mitral valve prolapse 12/13/2012    ONSET DATE: referral 07/25/2022   REFERRING DIAG: R41.89 (ICD-10-CM) - Cognitive change  THERAPY DIAG:  Cognitive communication deficit  Expressive language impairment  Rationale for Evaluation and  Treatment Rehabilitation  SUBJECTIVE:   SUBJECTIVE STATEMENT: "I've been having trouble sleeping, especially since the time change"   PAIN:  Are you having pain? Yes: NPRS scale: 5/10 Pain location: back and hips  SPEECH THERAPY DISCHARGE SUMMARY  Visits from Start of Care: 7  Current functional level related to goals / functional outcomes: Minimum improvement endorsed by pt, primarily d/t decreased motivation per pt    Remaining deficits: Mild cognitive deficits primarily marked by decreased task imitation, and completion, and memory per pt report.    Education / Equipment: Meta cognitive strategy instruction, internal and external memory strategies and compensations, attention strategies, external cognitive aids, HEP, healthy brain habits    Patient agrees to discharge. Patient goals were partially met. Patient is being discharged due to maximized rehab potential. .    OBJECTIVE:   TODAY'S TREATMENT:   09-25-22: SLP provided education on healthy habits to support brain health and function. Education on requirement to work on specific skills pt would like to improve vs spending time doing "brain games" which are not supported in literature to  aid in generalization of skills to real life. Provided resources for pt to aid in carryover of skills targeted throughout therapy course. Pt agreeable to ST d/c. Denies questions at conclusion of session   09-12-22: Donnamaria Shands support memory issues, long term, short term and working memory due to brain surgery. SLP facilitated working memory and executive function strategies with visualization and note taking in complex scheduling and deductive reasoning tasks. Pt required supervision A verbal cues fade to mod I in 2/3 tasks. SLP provided HEP deductive reasoning exercises and will provide information for HEP apps at d/c for memory and higher level word finding for abstract language.  09-04-22: Anwita Mencer reports ongoing decreased motivation and  initiation and feeling of overwhelm when thinking about tasks she needs to complete. SLP facilitated discussion re: factors which influence motivation and overwhelm with pt ID anxiety/depression, lack of A. Re-education about planning ahead for complex or lengthy tasks to A in completion. For example, choosing to clean out items for attic on weekend when someone is home to move them into the attic. Pt verbalizes understanding. Education on strategies to aid in processing and producing coherent discourse: pacing conversation, maintaining involvement, asking for extra time, using anomia compensations. Generated home practice recommendation which can leverage interest in motivation to work on discourse level language, word finding, task accomplishment, and long term memory.   08-27-22: Darius Bump hung her clothes as step one in Goal Plan Do Review. She ID'd her next step to go through her clothes. Review with questioning cues she ID'd that having her mom help her was beneficial. She verbalized that her closet was more functional because she can walk into it. Karyna Bessler also endorsed strategy of having her mom help her with the next step in her closet organization. Instructed her to use a timer for 15-20 minutes to work on 1 section at a time. I suggested she work on this 3x this week, she agreed to 2 15 minute sessions in closet. Dalaina Tates completed complex naming task, generating 1 items in  24 categories with given letter, she named 19/24 with occasional min semantic cues for 4 categories.   08-20-22: Goal-Plan-Do-Review framework was initiated, training a process that improves problem-solving, attention, organization, and self-monitoring. SLP provided education on framework use and purposes, including examples of goals which could be applied to. This framework was applied to the functional need of cleaning out pt's closet, as identified as a priority by pt. Darius Bump required min cues to identify goal in 1st step  of framework. The patient required max-A cues to generate plan steps, strategies and tools which may prove beneficial for plan completion. Pt benefited primarily from use of questioning cues. Generated strategies to accommodate pt's distractibility and reduced initiation. Pt verbalizes understanding. SLP assisted pt in ID tools she could use to implement daily to-do list targeted in last session. Of tools discussed, pt decides that a daily planner would be most facilitative. Will plan to buy prior to next session.   08-16-22: SLP facilitated completion of Cog Function PROM, see above. Generated strategy to A in initiation, recall of, and increased participation of desired tasks: generating daily to-do list. Determined structure which will potentially be beneficial for pt, see instructions. Education provided on strategies to aid in organization and implementation of generating new habits. Education on importance of incorporating new external aids into routine to maximize their effectiveness.  Pt verbalizes understanding. Pt generates x4 items to add to daily to-do list with supervision-A from  SLP. Teach back of external support targeted during today's session and purpose of the aid with occasional use of questioning cues.   PATIENT EDUCATION: Education details: see above Person educated: Patient Education method: Customer service manager Education comprehension: verbalized understanding, returned demonstration, and needs further education  GOALS: Goals reviewed with patient? Yes  SHORT TERM GOALS: Target date: 09/06/2022  Pt will demonstrate use of self-selected internal memory strategy during structured task, resulting in 80% accuracy during task completion, with rare min-A. Baseline: Goal status: IN PROGRESS  2.  Pt will complete high level language tasks with 90% accuracy given rare min-A over 2 sessions.  Baseline: 09/04/22 Goal status: MET  3.  Pt will complete meta-cognitive strategy  (e.g. goal plan do review) worksheet to facilitate return to scrapbooking or other pt selected avocational activity with rare min-A Baseline: 09-04-22 Goal status: MET   LONG TERM GOALS: Target date: 09/20/2022  Pt will employ strategies selected in plan for scrapbooking or other avocational activity, reporting successful completion of 2 pages over 1 week period  Baseline:  Goal status: NOT MET  2.  Pt will report generalization of internal memory strategies resulting in subjective perception of reduced memory lapses over 1 week period Baseline:  Goal status: NOT MET  3.  Pt will employ anomia strategies during 30 minute conversation, with no more than 3 verbal cues, resulting coherent discourse sample with SLP Baseline:  Goal status: MET  4.  Pt will use external aid to assist in initiation and recall of desired home based tasks, resulting in completion of x3 pt selected chores or activities on 4 days across 1 week period Baseline:  Goal status: NOT MET  5.  Pt will report subjective perception of improvement re: cognitive functioning via PROM by d/c Baseline:  Goal status: eferred   ASSESSMENT:  CLINICAL IMPRESSION: Pt demonstrates [facility] conversational speech this date. Reviewed strategies and compensations to aid in successful completion of desired tasks. Encouraged pt to mitigate stress and continue working on mental health. Provided resources for ongoing HEP. Pt agreeable to d/c, maximized rehab potential at this time d/t limited carryover of strategies discussed in therapy.   OBJECTIVE IMPAIRMENTS include attention, memory, executive functioning, and expressive language. These impairments are limiting patient from managing medications, managing appointments, household responsibilities, and effectively communicating at home and in community. Factors affecting potential to achieve goals and functional outcome are  n/a . Patient will benefit from skilled SLP services to address above  impairments and improve overall function.  REHAB POTENTIAL: Good  PLAN: SLP FREQUENCY: 1x/week  SLP DURATION: 6 weeks  PLANNED INTERVENTIONS: Language facilitation, Cueing hierachy, Cognitive reorganization, Internal/external aids, Functional tasks, SLP instruction and feedback, Compensatory strategies, and Patient/family education  Su Monks, CCC-SLP 09/25/2022, 10:17 AM

## 2022-09-25 NOTE — Patient Instructions (Signed)
Keeping your Brain Healthy MIND diet healthy items the MIND diet guidelines* suggest include: 3+ servings a day of whole grains 1+ servings a day of vegetables (other than green leafy) 6+ servings a week of green leafy vegetables Consider dosing if you won't be having a salad: eat a daily handful of spinach, they make greens supplements  5+ servings a week of nuts 4+ meals a week of beans 2+ servings a week of berries 2+ meals a week of poultry 1+ meals a week of fish Mainly olive oil if added fat is used The unhealthy items, which are higher in saturated and trans fat, include: Less than 5 servings a week of pastries and sweets Less than 4 servings a week of red meat (including beef, pork, lamb, and products made from these meats) Less than one serving a week of cheese and fried foods Less than 1 tablespoon a day of butter   Exercise Consider what can I do safely?  Ask you PT, what can I do? Optimal exercise dosing in 150 minutes a week-- you can see benefits just increasing a little  Choreographed dancing is a wonderful physical and mental exercise!!   Cognitive work Challenge your brain Learn something new about a topic that is interesting to you Write down 3-5 things you learned After 10-15 minutes, see what you remember May be helpful to read again Share what you learned with a communication partner Partake in and learn a new hobby Plan a menu Participate in household chores and decisions  Participate in managing finances Plan a party, trip or tailgate with all of the details  Participate in your current hobbies Google search for items (even if you're not really going to buy anything) and compare prices and features   Social Interaction Engaging with others is good for your language, your mental health, and your brain function  

## 2022-10-03 ENCOUNTER — Telehealth (HOSPITAL_BASED_OUTPATIENT_CLINIC_OR_DEPARTMENT_OTHER): Payer: Medicare HMO | Admitting: Psychiatry

## 2022-10-03 DIAGNOSIS — F5105 Insomnia due to other mental disorder: Secondary | ICD-10-CM | POA: Diagnosis not present

## 2022-10-03 DIAGNOSIS — F331 Major depressive disorder, recurrent, moderate: Secondary | ICD-10-CM | POA: Diagnosis not present

## 2022-10-03 DIAGNOSIS — F411 Generalized anxiety disorder: Secondary | ICD-10-CM | POA: Diagnosis not present

## 2022-10-03 DIAGNOSIS — F99 Mental disorder, not otherwise specified: Secondary | ICD-10-CM | POA: Diagnosis not present

## 2022-10-03 MED ORDER — OLANZAPINE 10 MG PO TABS: 10.0000 mg | ORAL_TABLET | Freq: Every day | ORAL | 0 refills | Status: DC

## 2022-10-03 NOTE — Progress Notes (Signed)
Virtual Visit via Video Note  I connected with Kathleen Everett on 10/03/22 at  9:00 AM EST by   a video enabled telemedicine application and verified that I am speaking with the correct person using two identifiers.  Location: Patient: home Provider: office   I discussed the limitations of evaluation and management by telemedicine and the availability of in person appointments. The patient expressed understanding and agreed to proceed.  History of Present Illness: Kathleen Everett shares she can't tell much difference in her mood with the increased dose of Zoloft. She tolerated the increased dose without SE. She is still very irritable and wants to isolate. Kathleen Everett gets upset when she tells the kids to do something and they don't and she ends up "shutting down". After that she doesn't want to tell them anything anymore and keeps to herself. She feels depressed every days and most days the level is around 7/10 (10 being the worst).  The anhedonia continues due to physical limitations. The time change has messed up her sleep. Her mind will not "shut off". She is not sleeping well and she is getting about 2-4 hrs/night. Her concentration remains poor. Her appetite is fair. She has some mild worthlessness but it is not overwhelming. She denies passive thoughts of death. She denies SI/HI. Her anxiety during the day is ongoing. She worries all the time about things she can't control or that don't matter. She knit picks about the smallest things.    Observations/Objective: Psychiatric Specialty Exam: ROS  Last menstrual period 12/08/2006.There is no height or weight on file to calculate BMI.  General Appearance: Casual  Eye Contact:  Good  Speech:  Clear and Coherent and Normal Rate  Volume:  Normal  Mood:  Anxious and Depressed  Affect:  Congruent  Thought Process:  Goal Directed, Linear, and Descriptions of Associations: Intact  Orientation:  Full (Time, Place, and Person)  Thought Content:   Logical  Suicidal Thoughts:  No  Homicidal Thoughts:  No  Memory:  Immediate;   Good  Judgement:  Good  Insight:  Good  Psychomotor Activity:  Normal  Concentration:  Concentration: Good  Recall:  Good  Fund of Knowledge:  Good  Language:  Good  Akathisia:  No  Handed:  Right  AIMS (if indicated):     Assets:  Communication Skills Desire for Improvement Financial Resources/Insurance Housing Leisure Time Resilience Social Support Talents/Skills Transportation Vocational/Educational  ADL's:  Intact  Cognition:  WNL  Sleep:        Assessment and Plan:     10/03/2022    9:02 AM 09/05/2022    9:44 AM 07/19/2022    9:41 AM 07/19/2022    9:06 AM 06/13/2022   11:11 AM  Depression screen PHQ 2/9  Decreased Interest '3 2 2 2 3  '$ Down, Depressed, Hopeless '3 2 3 3 3  '$ PHQ - 2 Score '6 4 5 5 6  '$ Altered sleeping '3 3 2  2  '$ Tired, decreased energy '3 3 3  3  '$ Change in appetite 0 0 0  2  Feeling bad or failure about yourself  '1 1 3  3  '$ Trouble concentrating '3 3 3  3  '$ Moving slowly or fidgety/restless 0 0 2  0  Suicidal thoughts 0 0 0  0  PHQ-9 Score '16 14 18  19  '$ Difficult doing work/chores Extremely dIfficult Very difficult Extremely dIfficult  Extremely dIfficult    Flowsheet Row Video Visit from 10/03/2022 in [place]  [facility] Video Visit from 09/05/2022 in [facility] Video Visit from 06/13/2022 in [place] No Risk No Risk No Risk         Pt is aware that these meds carry a teratogenic risk. Pt will discuss plan of action if she does or plans to become pregnant in the future.  Status of current problems: ongoing depression and anxiety  Meds: she has not been taking Ativan Start Zyprexa '10mg'$  po qHS for mood, anxiety and sleep Continue Zoloft '150mg'$  po qD 1. MDD (major depressive disorder), recurrent episode, moderate (HCC) - OLANZapine  (ZYPREXA) 10 MG tablet; Take 1 tablet (10 mg total) by mouth at bedtime.  Dispense: 14 tablet; Refill: 0  2. Generalized anxiety disorder  3. Insomnia due to other mental disorder     Labs: reviewed EKG 03/13/20 Qtc 435    Therapy: brief supportive therapy provided. Discussed psychosocial stressors in detail.    Collaboration of Care: Other none. She declined therapy  Patient/Guardian was advised Release of Information must be obtained prior to any record release in order to collaborate their care with an outside provider. Patient/Guardian was advised if they have not already done so to contact the registration department to sign all necessary forms in order for [place] to release information regarding their care.   Consent: Patient/Guardian gives verbal consent for treatment and assignment of benefits for services provided during this visit. Patient/Guardian expressed understanding and agreed to proceed.      Follow Up Instructions: Follow up in 2 weeks or sooner if needed    I discussed the assessment and treatment plan with the patient. The patient was provided an opportunity to ask questions and all were answered. The patient agreed with the plan and demonstrated an understanding of the instructions.   The patient was advised to call back or seek an in-person evaluation if the symptoms worsen or if the condition fails to improve as anticipated.  I provided 19 minutes of non-face-to-face time during this encounter.   Charlcie Cradle, MD

## 2022-10-08 ENCOUNTER — Other Ambulatory Visit: Payer: Self-pay

## 2022-10-08 ENCOUNTER — Encounter: Payer: Self-pay | Admitting: Internal Medicine

## 2022-10-08 MED ORDER — OZEMPIC (2 MG/DOSE) 8 MG/3ML ~~LOC~~ SOPN: PEN_INJECTOR | SUBCUTANEOUS | 3 refills | Status: DC

## 2022-10-08 MED ORDER — CYANOCOBALAMIN 1000 MCG/ML IJ SOLN: INTRAMUSCULAR | 0 refills | Status: DC

## 2022-10-17 ENCOUNTER — Telehealth (HOSPITAL_BASED_OUTPATIENT_CLINIC_OR_DEPARTMENT_OTHER): Payer: Medicare HMO | Admitting: Psychiatry

## 2022-10-17 DIAGNOSIS — F331 Major depressive disorder, recurrent, moderate: Secondary | ICD-10-CM

## 2022-10-17 MED ORDER — OLANZAPINE 5 MG PO TABS: 5.0000 mg | ORAL_TABLET | Freq: Every day | ORAL | 0 refills | Status: DC

## 2022-10-17 NOTE — Progress Notes (Signed)
Virtual Visit via Video Note  I connected with Kathleen Everett on 10/17/22 at 11:45 AM EST by  a video enabled telemedicine application and verified that I am speaking with the correct person using two identifiers.  Location: Patient: home Provider: office   I discussed the limitations of evaluation and management by telemedicine and the availability of in person appointments. The patient expressed understanding and agreed to proceed.  History of Present Illness: Kathleen Everett started Zyprexa about 2 weeks ago. She has only been taking 1/2 tab because it makes her very tired the next day. It makes her want to sleep until late in the morning or spending more time in bed in general. She is able to sleep well but has "crazy dreams".  When she takes 2.'5mg'$  of Zyprexa it doesn't help her rest. Her mood is "ok" but she depressed and irritable. She doesn't know if she needs more time to get used to it. She denies SI/HI.    Observations/Objective: Psychiatric Specialty Exam: ROS  Last menstrual period 12/08/2006.There is no height or weight on file to calculate BMI.  General Appearance: Casual  Eye Contact:  Good  Speech:  Clear and Coherent and Normal Rate  Volume:  Normal  Mood:  Depressed  Affect:  Full Range  Thought Process:  Goal Directed, Linear, and Descriptions of Associations: Intact  Orientation:  Full (Time, Place, and Person)  Thought Content:  Logical  Suicidal Thoughts:  No  Homicidal Thoughts:  No  Memory:  Immediate;   Good  Judgement:  Good  Insight:  Good  Psychomotor Activity:  Normal  Concentration:  Concentration: Good  Recall:  Good  Fund of Knowledge:  Good  Language:  Good  Akathisia:  No  Handed:  Right  AIMS (if indicated):     Assets:  Communication Skills Desire for Improvement Financial Resources/Insurance Housing Resilience Talents/Skills Transportation Vocational/Educational  ADL's:  Intact  Cognition:  WNL  Sleep:        Assessment and  Plan:     10/03/2022    9:02 AM 09/05/2022    9:44 AM 07/19/2022    9:41 AM 07/19/2022    9:06 AM 06/13/2022   11:11 AM  Depression screen PHQ 2/9  Decreased Interest '3 2 2 2 3  '$ Down, Depressed, Hopeless '3 2 3 3 3  '$ PHQ - 2 Score '6 4 5 5 6  '$ Altered sleeping '3 3 2  2  '$ Tired, decreased energy '3 3 3  3  '$ Change in appetite 0 0 0  2  Feeling bad or failure about yourself  '1 1 3  3  '$ Trouble concentrating '3 3 3  3  '$ Moving slowly or fidgety/restless 0 0 2  0  Suicidal thoughts 0 0 0  0  PHQ-9 Score '16 14 18  19  '$ Difficult doing work/chores Extremely dIfficult Very difficult Extremely dIfficult  Extremely dIfficult    Flowsheet Row Video Visit from 10/03/2022 in [facility] Video Visit from 09/05/2022 in [facility] Video Visit from 06/13/2022 in [place] [facility] No Risk No Risk No Risk         Pt is aware that these meds carry a teratogenic risk. Pt will discuss plan of action if she does or plans to become pregnant in the future.  Status of current problems: Mild decrease in depression and irritability. She wants to continue Zyprexa at '5mg'$  and see if she gets used  to the groggy feeling.   Meds: decrease Zyprexa '5mg'$  po qHS 1. MDD (major depressive disorder), recurrent episode, moderate (HCC) - OLANZapine (ZYPREXA) 5 MG tablet; Take 1 tablet (5 mg total) by mouth at bedtime.  Dispense: 30 tablet; Refill: 0        Therapy: brief supportive therapy provided.   Collaboration of Care: Other none  Patient/Guardian was advised Release of Information must be obtained prior to any record release in order to collaborate their care with an outside provider. Patient/Guardian was advised if they have not already done so to contact the registration department to sign all necessary forms in order for [place] to release information regarding their care.   Consent:  Patient/Guardian gives verbal consent for treatment and assignment of benefits for services provided during this visit. Patient/Guardian expressed understanding and agreed to proceed.       Follow Up Instructions: Follow up in 1 month or sooner if needed    I discussed the assessment and treatment plan with the patient. The patient was provided an opportunity to ask questions and all were answered. The patient agreed with the plan and demonstrated an understanding of the instructions.   The patient was advised to call back or seek an in-person evaluation if the symptoms worsen or if the condition fails to improve as anticipated.  I provided 8 minutes of non-face-to-face time during this encounter.   Charlcie Cradle, MD

## 2022-10-22 ENCOUNTER — Encounter: Payer: Self-pay | Admitting: Internal Medicine

## 2022-10-22 ENCOUNTER — Ambulatory Visit (INDEPENDENT_AMBULATORY_CARE_PROVIDER_SITE_OTHER): Payer: Medicare HMO | Admitting: Internal Medicine

## 2022-10-22 ENCOUNTER — Ambulatory Visit (INDEPENDENT_AMBULATORY_CARE_PROVIDER_SITE_OTHER): Payer: Medicare HMO

## 2022-10-22 VITALS — BP 124/70 | HR 81 | Temp 97.9°F | Resp 17 | Ht 70.0 in | Wt 215.4 lb

## 2022-10-22 DIAGNOSIS — M26629 Arthralgia of temporomandibular joint, unspecified side: Secondary | ICD-10-CM

## 2022-10-22 DIAGNOSIS — M25552 Pain in left hip: Secondary | ICD-10-CM

## 2022-10-22 DIAGNOSIS — M533 Sacrococcygeal disorders, not elsewhere classified: Secondary | ICD-10-CM

## 2022-10-22 DIAGNOSIS — Q0700 Arnold-Chiari syndrome without spina bifida or hydrocephalus: Secondary | ICD-10-CM | POA: Diagnosis not present

## 2022-10-22 DIAGNOSIS — G8929 Other chronic pain: Secondary | ICD-10-CM

## 2022-10-22 DIAGNOSIS — M545 Low back pain, unspecified: Secondary | ICD-10-CM

## 2022-10-22 DIAGNOSIS — R739 Hyperglycemia, unspecified: Secondary | ICD-10-CM | POA: Diagnosis not present

## 2022-10-22 DIAGNOSIS — D649 Anemia, unspecified: Secondary | ICD-10-CM

## 2022-10-22 DIAGNOSIS — K219 Gastro-esophageal reflux disease without esophagitis: Secondary | ICD-10-CM

## 2022-10-22 DIAGNOSIS — R1084 Generalized abdominal pain: Secondary | ICD-10-CM

## 2022-10-22 DIAGNOSIS — F325 Major depressive disorder, single episode, in full remission: Secondary | ICD-10-CM

## 2022-10-22 DIAGNOSIS — E7800 Pure hypercholesterolemia, unspecified: Secondary | ICD-10-CM | POA: Diagnosis not present

## 2022-10-22 DIAGNOSIS — G43809 Other migraine, not intractable, without status migrainosus: Secondary | ICD-10-CM

## 2022-10-22 LAB — HEPATIC FUNCTION PANEL
ALT: 33 U/L (ref 0–35)
AST: 28 U/L (ref 0–37)
Albumin: 4.3 g/dL (ref 3.5–5.2)
Alkaline Phosphatase: 84 U/L (ref 39–117)
Bilirubin, Direct: 0 mg/dL (ref 0.0–0.3)
Total Bilirubin: 0.3 mg/dL (ref 0.2–1.2)
Total Protein: 6.6 g/dL (ref 6.0–8.3)

## 2022-10-22 LAB — BASIC METABOLIC PANEL
BUN: 13 mg/dL (ref 6–23)
CO2: 32 mEq/L (ref 19–32)
Calcium: 9.1 mg/dL (ref 8.4–10.5)
Chloride: 101 mEq/L (ref 96–112)
Creatinine, Ser: 0.75 mg/dL (ref 0.40–1.20)
GFR: 93.55 mL/min (ref >60.00)
Glucose, Bld: 74 mg/dL (ref 70–99)
Potassium: 4.5 mEq/L (ref 3.5–5.1)
Sodium: 138 mEq/L (ref 135–145)

## 2022-10-22 LAB — LIPID PANEL
Cholesterol: 255 mg/dL — ABNORMAL HIGH (ref 0–200)
HDL: 49.6 mg/dL (ref >39.00)
LDL Cholesterol: 172 mg/dL — ABNORMAL HIGH (ref 0–99)
NonHDL: 205.84
Total CHOL/HDL Ratio: 5
Triglycerides: 168 mg/dL — ABNORMAL HIGH (ref 0.0–149.0)
VLDL: 33.6 mg/dL (ref 0.0–40.0)

## 2022-10-22 LAB — HEMOGLOBIN A1C: Hgb A1c MFr Bld: 5.3 % (ref 4.6–6.5)

## 2022-10-22 NOTE — Progress Notes (Signed)
Patient ID: Kathleen Everett, female   DOB: 08-27-1971, 51 y.o.   MRN: [id number]   Subjective:    Patient ID: Kathleen Everett, female    DOB: 02/16/1971, 51 y.o.   MRN: [id number]   Patient here for  Chief Complaint  Patient presents with   Hyperlipidemia   .   HPI Here to follow up regarding blood sugar, GERD and cholesterol.  Seeing neurology for migraines - ubrelvy, aimovig and rizatriptan. Recommended a referral ST.  Seeing psychiatry for depression.  Recently started on zyprexa.  Made her too groggy the next day.  On lower dose. No chest pain or sob reported.  Abdominal pain - better.  Two falls recently.  One in 08/2022 - outside - landed on tailbone.  Last week - left knee and right wrist.  No LOC. No head injury.  TMJ - worse.  Had f/u with dentist.  Question if would benefit from mouth guard.      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   Endometriosis    Generalized anxiety disorder 05/01/2015   Has previously seen psychiatry. Having some issues with what she feels is related to stress and anxiety   GERD (gastroesophageal reflux disease)    Hemorrhoids 12/23/2020   Hidradenitis    History of ovarian cyst    Hot flashes 03/12/2015   Hypercholesteremia 01/10/2014   Off lipitor. Elevated cholesterol. Labs discussed. Restart lipitor. Follow lipid panel and liver function tests.   Hyperglycemia 06/24/2019   Major depressive disorder 05/01/2015   Migraine headaches 01/10/2014    Has seen neurology. Topamax.   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 Level is 21.6   Past Surgical History:  Procedure Laterality Date   ABDOMINAL HYSTERECTOMY  03/27/07   laparoscopic   BREAST EXCISIONAL BIOPSY Left 2004   fibroadenoma   CESAREAN SECTION     1999/2004 twins   chiari decompression     COLONOSCOPY WITH PROPOFOL N/A 04/28/2015   Procedure: COLONOSCOPY WITH PROPOFOL;  Surgeon: Manya Silvas, MD;  Location: [facility] ENDOSCOPY;  Service: Endoscopy;  Laterality: N/A;   COLONOSCOPY WITH PROPOFOL N/A 03/19/2021   Procedure: COLONOSCOPY WITH PROPOFOL;  Surgeon: Lesly Rubenstein, MD;  Location: [facility] ENDOSCOPY;  Service: Endoscopy;  Laterality: N/A;   COLPOSCOPY N/A 04/24/2022   Procedure: COLPOSCOPY, ABLATION OF VULVAR TISSUE;  Surgeon: Gillis Ends, MD;  Location: [facility] ORS;  Service: Gynecology;  Laterality: N/A;  CYSTOSCOPY     bladder ulcers   ESOPHAGOGASTRODUODENOSCOPY N/A 04/28/2015   Procedure: ESOPHAGOGASTRODUODENOSCOPY (EGD);  Surgeon: Manya Silvas, MD;  Location: [facility] ENDOSCOPY;  Service: Endoscopy;  Laterality: N/A;   ESOPHAGOGASTRODUODENOSCOPY (EGD) WITH PROPOFOL N/A 03/19/2021   Procedure: ESOPHAGOGASTRODUODENOSCOPY (EGD) WITH PROPOFOL;  Surgeon: Lesly Rubenstein, MD;  Location: [facility] ENDOSCOPY;  Service: Endoscopy;  Laterality: N/A;   HERNIA REPAIR Right 2003   inguinal   INCISION AND DRAINAGE HIP Right 05/11/2020   Procedure: IRRIGATION AND DEBRIDEMENT HIP;  Surgeon: Hessie Knows, MD;   Location: [facility] ORS;  Service: Orthopedics;  Laterality: Right;   OVARIAN CYST REMOVAL  2001   right   TEMPOROMANDIBULAR JOINT SURGERY  2000   TOTAL HIP ARTHROPLASTY Right 12/28/2019   Procedure: TOTAL HIP ARTHROPLASTY ANTERIOR APPROACH;  Surgeon: Hessie Knows, MD;  Location: [facility] ORS;  Service: Orthopedics;  Laterality: Right;   TUBAL LIGATION     Bilateral   VULVA /PERINEUM BIOPSY  04/24/2022   Procedure: VULVAR BIOPSY;  Surgeon: Gillis Ends, MD;  Location: [facility] ORS;  Service: Gynecology;;   Family History  Problem Relation Age of Onset   Hyperlipidemia Mother    Anxiety disorder Mother    Atrial fibrillation Mother    Hypertension Mother    Memory loss Mother    Anxiety disorder Father    Stroke Father    Hyperlipidemia Father    Hypertension Father    Congestive Heart Failure Father    Depression Father    Hyperlipidemia Brother    Colon cancer Paternal Aunt    Dementia Paternal Aunt    Dementia Maternal Grandmother    Dementia Paternal Grandmother    ADD / ADHD Daughter    Breast cancer Neg Hx    Social History   Socioeconomic History   Marital status: Married    Spouse name: Gerald Stabs   Number of children: 3   Years of education: 13   Highest education level: Some college, no degree  Occupational History   Occupation: Disability  Tobacco Use   Smoking status: Never   Smokeless tobacco: Never  Vaping Use   Vaping Use: Never used  Substance and Sexual Activity   Alcohol use: Not Currently    Alcohol/week: 0.0 standard drinks of alcohol   Drug use: No   Sexual activity: Yes    Birth control/protection: Surgical    Comment: Hysterectomy  Other Topics Concern   Not on file  Social History Narrative   Patient drinks 2-3 cups of caffeine daily.   Patient is right handed.       Social Hx:   Current living situation- [place]. Living with husband and 3 kids   Born and Raised- [place]raised by both parents.    Siblings- 1 brother who is 7 yrs older than her    Legal issues- none   Married for 24 yrs. Currently unemployed since 2011 when she hurt her back. HS grad and CNA and phlebotomy.    Social Determinants of Health   Financial Resource Strain: Low Risk  (02/18/2022)   Overall Financial Resource Strain (CARDIA)    Difficulty of Paying Living Expenses: Not hard at all  Food Insecurity: No Food Insecurity (02/18/2022)   Hunger Vital Sign    Worried About Running Out of Food in the Last Year: Never true    Ran Out of Food in the Last Year: Never true  Transportation Needs: No Transportation Needs (02/18/2022)   PRAPARE - Hydrologist (  Medical): No    Lack of Transportation (Non-Medical): No  Physical Activity: Insufficiently Active (02/18/2022)   Exercise Vital Sign    Days of Exercise per Week: 7 days    Minutes of Exercise per Session: 10 min  Stress: No Stress Concern Present (02/18/2022)   [place]    Feeling of Stress : Not at all  Social Connections: [place] (02/18/2022)   Social Connection and Isolation Panel [NHANES]    Frequency of Communication with Friends and Family: More than three times a week    Frequency of Social Gatherings with Friends and Family: More than three times a week    Attends Religious Services: More than 4 times per year    Active Member of [employer] or Organizations: Not on file    Attends Music therapist: More than 4 times per year    Marital Status: Married     Review of Systems  Constitutional:  Negative for appetite change and unexpected weight change.  HENT:  Negative for congestion and sinus pressure.   Respiratory:  Negative for cough, chest tightness and shortness of breath.   Cardiovascular:  Negative for chest pain, palpitations and leg swelling.  Gastrointestinal:  Negative for abdominal pain, diarrhea, nausea and vomiting.  Genitourinary:  Negative for difficulty urinating and  dysuria.  Musculoskeletal:  Positive for back pain. Negative for myalgias.  Skin:  Negative for color change and rash.  Neurological:  Negative for dizziness.       Seeing neurology for headaches.    Psychiatric/Behavioral:  Negative for agitation and dysphoric mood.        Objective:     BP 124/70 (BP Location: Left Arm, Patient Position: Sitting, Cuff Size: Large)   Pulse 81   Temp 97.9 F (36.6 C) (Temporal)   Resp 17   Ht _0  (1.778 m)   Wt 215 lb 6.4 oz (97.7 kg)   LMP 12/08/2006   SpO2 99%   BMI 30.91 kg/m  Wt Readings from Last 3 Encounters:  10/22/22 215 lb 6.4 oz (97.7 kg)  07/19/22 214 lb 9.6 oz (97.3 kg)  06/12/22 220 lb 1.6 oz (99.8 kg)    Physical Exam Vitals reviewed.  Constitutional:      General: She is not in acute distress.    Appearance: Normal appearance.  HENT:     Head: Normocephalic and atraumatic.     Right Ear: External ear normal.     Left Ear: External ear normal.  Eyes:     General: No scleral icterus.       Right eye: No discharge.        Left eye: No discharge.     Conjunctiva/sclera: Conjunctivae normal.  Neck:     Thyroid: No thyromegaly.  Cardiovascular:     Rate and Rhythm: Normal rate and regular rhythm.  Pulmonary:     Effort: No respiratory distress.     Breath sounds: Normal breath sounds. No wheezing.  Abdominal:     General: Bowel sounds are normal.     Palpations: Abdomen is soft.     Tenderness: There is no abdominal tenderness.  Musculoskeletal:        General: No swelling.     Cervical back: Neck supple. No tenderness.     Comments: Increased pain - sacrum/coccyx to palpation.   Lymphadenopathy:     Cervical: No cervical adenopathy.  Skin:    Findings: No erythema or rash.  Neurological:     Mental Status: She is alert.  Psychiatric:        Mood and Affect: Mood normal.        Behavior: Behavior normal.      Outpatient Encounter Medications as of 10/22/2022  Medication Sig   acetaminophen (TYLENOL)  325 MG tablet Take 500 mg by mouth every 6 (six) hours as needed.   AIMOVIG 140 MG/ML SOAJ Inject 140 mg into the skin every 28 (twenty-eight) days.   atorvastatin (LIPITOR) 10 MG tablet Take 1 tablet (10 mg total) by mouth daily.   azelastine (ASTELIN) 0.1 % nasal spray Place 1 spray into both nostrils 2 (two) times daily. Use in each nostril as directed   clobetasol (TEMOVATE) 0.05 % external solution Apply topically daily as needed.   Crisaborole (EUCRISA) 2 % OINT Apply twice a day to hands prn flares   cyanocobalamin (VITAMIN B12) 1000 MCG/ML injection INJECT 1 ML INTRAMUSCULARLY ONCE EVERY MONTH   diclofenac Sodium (VOLTAREN) 1 % GEL SMARTSIG:Gram(s) Topical 4 Times Daily PRN   esomeprazole (NEXIUM) 40 MG capsule Take 1 capsule by mouth once daily   lactase (RA DAIRY AID) 3000 units tablet Take 9,000 Units by mouth 3 (three) times daily as needed (with dairy consumption).    methocarbamol (ROBAXIN) 500 MG tablet Take 500 mg by mouth 4 (four) times daily.   mupirocin ointment (BACTROBAN) 2 % Apply to healing wound TID.   nystatin cream (MYCOSTATIN) Apply topically 2 (two) times daily.   OLANZapine (ZYPREXA) 5 MG tablet Take 1 tablet (5 mg total) by mouth at bedtime.   oxyCODONE-acetaminophen (PERCOCET) 10-325 MG tablet Take by mouth.   rizatriptan (MAXALT) 10 MG tablet Take 10 mg by mouth as needed for migraine. May repeat in 2 hours if needed   Ruxolitinib Phosphate (OPZELURA) 1.5 % CREA Apply twice daily to hands and feet   Semaglutide, 2 MG/DOSE, (OZEMPIC, 2 MG/DOSE,) 8 MG/3ML SOPN INJECT 2MG AS DIRECTED ONCE WEEKLY   sertraline (ZOLOFT) 100 MG tablet Take 1.5 tablets (150 mg total) by mouth daily.   Simethicone (GAS RELIEF PO) Take 125 mg by mouth 2 (two) times daily as needed for flatulence. Anti gas   tacrolimus (PROTOPIC) 0.1 % ointment APPLY TOPICALLY TWO (2) (TWO) TIMES DAILY.   topiramate (TOPAMAX) 25 MG capsule Take 25 mg by mouth 2 (two) times daily.   Ubrogepant (UBRELVY) 100  MG TABS Take 100 mg by mouth 2 (two) times daily as needed.   [DISCONTINUED] LORazepam (ATIVAN) 0.5 MG tablet Take 1 tablet (0.5 mg total) by mouth daily as needed for anxiety. (Patient not taking: Reported on 08/09/2022)   No facility-administered encounter medications on file as of 10/22/2022.     Lab Results  Component Value Date   WBC 5.6 07/19/2022   HGB 13.3 07/19/2022   HCT 38.9 07/19/2022   PLT 185.0 07/19/2022   GLUCOSE 74 10/22/2022   CHOL 255 (H) 10/22/2022   TRIG 168.0 (H) 10/22/2022   HDL 49.60 10/22/2022   LDLDIRECT 181.0 06/26/2020   LDLCALC 172 (H) 10/22/2022   ALT 33 10/22/2022   AST 28 10/22/2022   NA 138 10/22/2022   K 4.5 10/22/2022   CL 101 10/22/2022   CREATININE 0.75 10/22/2022   BUN 13 10/22/2022   CO2 32 10/22/2022   TSH 1.59 07/17/2021   INR 1.1 12/03/2019   HGBA1C 5.3 10/22/2022    MR LUMBAR SPINE WO CONTRAST  Result Date: 08/27/2022 CLINICAL DATA:  Low back pain bilateral  leg pain. EXAM: MRI LUMBAR SPINE WITHOUT CONTRAST TECHNIQUE: Multiplanar, multisequence MR imaging of the lumbar spine was performed. No intravenous contrast was administered. COMPARISON:  Lumbar MRI 08/31/2010 FINDINGS: Segmentation:  5 lumbar vertebra.  Lowest disc space L5-S1 Alignment:  Mild retrolisthesis L2-3 and L3-4 Vertebrae:  Negative for fracture or mass.  Normal bone marrow. Conus medullaris and cauda equina: Conus extends to the L1-2 level. Conus and cauda equina appear normal. Paraspinal and other soft tissues: Negative for paraspinous mass or adenopathy Disc levels: L1-2: Mild facet degeneration. Negative for disc protrusion or stenosis L2-3: Disc degeneration with disc bulging and bilateral facet degeneration. No significant spinal stenosis L3-4: Mild disc degeneration with diffuse disc bulging and bilateral facet degeneration. Mild subarticular stenosis bilaterally. Central canal patent L4-5: Disc degeneration with diffuse disc bulging and shallow central disc protrusion.  Bilateral facet degeneration. Mild subarticular stenosis bilaterally. Central canal patent L5-S1: Negative IMPRESSION: Mild lumbar degenerative change. Mild subarticular stenosis bilaterally L3-4 and L4-5. Progressive disc degeneration at L2-3, L3-4, L4-5 since 2011 Electronically Signed   By: Franchot Gallo M.D.   On: 08/27/2022 21:02       Assessment & Plan:   Problem List Items Addressed This Visit     Abdominal pain    Doing better.        Anemia    Follow cbc.       Arnold-Chiari malformation (HCC)    S/p surgery.  Followed by neurology.       Chronic back pain    Followed by pain clinic.       Depression, major, in remission [facility])    Seeing psychiatry.  On zoloft.  Follow.  Appears to be stable.       GERD (gastroesophageal reflux disease)    Continue nexium.       Hip pain, left    Increased pain.  Recent fall.  Check xray.  Further w/up pending results.       Relevant Orders   DG HIP UNILAT WITH PELVIS 2-3 VIEWS LEFT (Completed)   Hypercholesteremia - Primary    On lipitor.  Low cholesterol diet and exercise.  Follow lipid panel and liver function tests.        Relevant Orders   Basic metabolic panel (Completed)   Hepatic function panel (Completed)   Lipid panel (Completed)   Hyperglycemia    Low carb diet and exercise.  Follow met b and a1c.       Relevant Orders   Hemoglobin A1c (Completed)   Migraine headaches    Followed by neurology.  Overall appear to be stable on current regimen.       Sacral pain    Persistent.  S/p fall.  Check xray.       Relevant Orders   DG Sacrum/Coccyx (Completed)   TMJ arthralgia    Due to see dentist soon.  Discuss mouthguard or referral.         [name], MD

## 2022-10-23 ENCOUNTER — Encounter: Payer: Self-pay | Admitting: Internal Medicine

## 2022-10-23 DIAGNOSIS — Z809 Family history of malignant neoplasm, unspecified: Secondary | ICD-10-CM

## 2022-10-23 NOTE — Telephone Encounter (Signed)
Please call and notify - BRCA gene - is one that she may have heard of - for testing.  If there is a family history of multiple cancer, what I do is refer to genetic counselor.  They are located at the cancer center.  I can place order for the referral if she is agreeable.

## 2022-10-23 NOTE — Telephone Encounter (Signed)
Order placed for genetic counseling.

## 2022-10-23 NOTE — Telephone Encounter (Signed)
S/w pt - agreeable to genetic counseling referral at cancer center

## 2022-10-27 ENCOUNTER — Encounter: Payer: Self-pay | Admitting: Internal Medicine

## 2022-10-27 DIAGNOSIS — M26629 Arthralgia of temporomandibular joint, unspecified side: Secondary | ICD-10-CM | POA: Insufficient documentation

## 2022-10-27 NOTE — Assessment & Plan Note (Signed)
Increased pain.  Recent fall.  Check xray.  Further w/up pending results.

## 2022-10-27 NOTE — Assessment & Plan Note (Signed)
Continue nexium 

## 2022-10-27 NOTE — Assessment & Plan Note (Signed)
S/p surgery.  Followed by neurology.

## 2022-10-27 NOTE — Assessment & Plan Note (Signed)
On lipitor.  Low cholesterol diet and exercise.  Follow lipid panel and liver function tests.   

## 2022-10-27 NOTE — Assessment & Plan Note (Signed)
Doing better.   

## 2022-10-27 NOTE — Assessment & Plan Note (Signed)
Persistent.  S/p fall.  Check xray.

## 2022-10-27 NOTE — Assessment & Plan Note (Signed)
Low carb diet and exercise.  Follow met b and a1c.  

## 2022-10-27 NOTE — Assessment & Plan Note (Signed)
Followed by neurology.  Overall appear to be stable on current regimen.

## 2022-10-27 NOTE — Assessment & Plan Note (Signed)
Followed by pain clinic.  

## 2022-10-27 NOTE — Assessment & Plan Note (Signed)
Follow cbc.  

## 2022-10-27 NOTE — Assessment & Plan Note (Signed)
Seeing psychiatry.  On zoloft.  Follow.  Appears to be stable.

## 2022-10-27 NOTE — Assessment & Plan Note (Signed)
Due to see dentist soon.  Discuss mouthguard or referral.

## 2022-10-29 DIAGNOSIS — M47816 Spondylosis without myelopathy or radiculopathy, lumbar region: Secondary | ICD-10-CM | POA: Diagnosis not present

## 2022-11-25 DIAGNOSIS — Q0700 Arnold-Chiari syndrome without spina bifida or hydrocephalus: Secondary | ICD-10-CM | POA: Diagnosis not present

## 2022-11-25 DIAGNOSIS — R2689 Other abnormalities of gait and mobility: Secondary | ICD-10-CM | POA: Diagnosis not present

## 2022-11-25 DIAGNOSIS — G43019 Migraine without aura, intractable, without status migrainosus: Secondary | ICD-10-CM | POA: Diagnosis not present

## 2022-11-25 DIAGNOSIS — G3184 Mild cognitive impairment, so stated: Secondary | ICD-10-CM | POA: Diagnosis not present

## 2022-11-26 ENCOUNTER — Inpatient Hospital Stay: Payer: Medicare HMO | Attending: Oncology | Admitting: Licensed Clinical Social Worker

## 2022-11-26 ENCOUNTER — Inpatient Hospital Stay: Payer: Medicare HMO

## 2022-11-26 ENCOUNTER — Encounter: Payer: Self-pay | Admitting: Licensed Clinical Social Worker

## 2022-11-26 DIAGNOSIS — N900 Mild vulvar dysplasia: Secondary | ICD-10-CM | POA: Insufficient documentation

## 2022-11-26 DIAGNOSIS — Z79899 Other long term (current) drug therapy: Secondary | ICD-10-CM | POA: Diagnosis not present

## 2022-11-26 DIAGNOSIS — M5412 Radiculopathy, cervical region: Secondary | ICD-10-CM | POA: Diagnosis not present

## 2022-11-26 DIAGNOSIS — N9089 Other specified noninflammatory disorders of vulva and perineum: Secondary | ICD-10-CM | POA: Insufficient documentation

## 2022-11-26 DIAGNOSIS — M47816 Spondylosis without myelopathy or radiculopathy, lumbar region: Secondary | ICD-10-CM | POA: Diagnosis not present

## 2022-11-26 DIAGNOSIS — G2581 Restless legs syndrome: Secondary | ICD-10-CM | POA: Diagnosis not present

## 2022-11-26 DIAGNOSIS — Z800 Family history of malignant neoplasm of digestive organs: Secondary | ICD-10-CM | POA: Diagnosis not present

## 2022-11-26 DIAGNOSIS — M25511 Pain in right shoulder: Secondary | ICD-10-CM | POA: Diagnosis not present

## 2022-11-26 DIAGNOSIS — Z5181 Encounter for therapeutic drug level monitoring: Secondary | ICD-10-CM | POA: Diagnosis not present

## 2022-11-26 DIAGNOSIS — M25551 Pain in right hip: Secondary | ICD-10-CM | POA: Diagnosis not present

## 2022-11-26 DIAGNOSIS — M5442 Lumbago with sciatica, left side: Secondary | ICD-10-CM | POA: Diagnosis not present

## 2022-11-26 DIAGNOSIS — Z87412 Personal history of vulvar dysplasia: Secondary | ICD-10-CM | POA: Insufficient documentation

## 2022-11-26 DIAGNOSIS — M160 Bilateral primary osteoarthritis of hip: Secondary | ICD-10-CM | POA: Diagnosis not present

## 2022-11-26 DIAGNOSIS — G894 Chronic pain syndrome: Secondary | ICD-10-CM | POA: Diagnosis not present

## 2022-11-26 DIAGNOSIS — M5137 Other intervertebral disc degeneration, lumbosacral region: Secondary | ICD-10-CM | POA: Diagnosis not present

## 2022-11-26 DIAGNOSIS — Z87411 Personal history of vaginal dysplasia: Secondary | ICD-10-CM | POA: Insufficient documentation

## 2022-11-26 NOTE — Progress Notes (Addendum)
REFERRING PROVIDER: [name], [address] [place],  [place] [address]  PRIMARY PROVIDER:  [name], MD  PRIMARY REASON FOR VISIT:  1. Family history of colon cancer    I connected with Kathleen Everett on 11/26/2022 at 2:00 PM EDT by MyChart video conference and verified that I am speaking with the correct person using two identifiers.    Patient location: home Provider location: [place]:   Kathleen Everett, a 52 y.o. female, was seen for a [facility] cancer genetics consultation at the request of Dr. Nicki Reaper due to a family history of cancer.  Kathleen Everett presents to clinic today to discuss the possibility of a hereditary predisposition to cancer, genetic testing, and to further clarify her future cancer risks, as well as potential cancer risks for family members.   CANCER HISTORY:  Kathleen Everett is a 52 y.o. female with no personal history of cancer.  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  RISK FACTORS:  Menarche was at age 10.  First live birth at age 36.  OCP use for approximately 2 years.  Ovaries intact: no.  Hysterectomy: yes.  Colonoscopy: yes; normal. Number of breast biopsies: 1. Up to date with pelvic exams: yes. 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   Endometriosis     Generalized anxiety disorder 05/01/2015   Has previously seen psychiatry. Having some issues with what she feels is related to stress and anxiety   GERD (gastroesophageal reflux disease)    Hemorrhoids 12/23/2020   Hidradenitis    History of ovarian cyst    Hot flashes 03/12/2015   Hypercholesteremia 01/10/2014   Off lipitor. Elevated cholesterol. Labs discussed. Restart lipitor. Follow lipid panel and liver function tests.   Hyperglycemia 06/24/2019   Major depressive disorder 05/01/2015   Migraine headaches 01/10/2014   Has seen neurology. Topamax.   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 Level is 21.6    Past Surgical History:  Procedure Laterality Date   ABDOMINAL HYSTERECTOMY  03/27/07   laparoscopic   BREAST EXCISIONAL BIOPSY Left 2004   fibroadenoma   CESAREAN SECTION     1999/2004 twins   chiari decompression     COLONOSCOPY WITH PROPOFOL N/A 04/28/2015   Procedure: COLONOSCOPY WITH PROPOFOL;  Surgeon:  Manya Silvas, MD;  [facility] ENDOSCOPY;  Service: Endoscopy;  Laterality: N/A;   COLONOSCOPY WITH PROPOFOL N/A 03/19/2021   Procedure: COLONOSCOPY WITH PROPOFOL;  Surgeon: Lesly Rubenstein, MD;  Location: [facility] ENDOSCOPY;  Service: Endoscopy;  Laterality: N/A;   COLPOSCOPY N/A 04/24/2022   Procedure: COLPOSCOPY, ABLATION OF VULVAR TISSUE;  Surgeon: Gillis Ends, MD;  Location: [facility] ORS;  Service: Gynecology;  Laterality: N/A;   CYSTOSCOPY     bladder ulcers   ESOPHAGOGASTRODUODENOSCOPY N/A 04/28/2015    Procedure: ESOPHAGOGASTRODUODENOSCOPY (EGD);  Surgeon: Manya Silvas, MD;  Location[facility] ENDOSCOPY;  Service: Endoscopy;  Laterality: N/A;   ESOPHAGOGASTRODUODENOSCOPY (EGD) WITH PROPOFOL N/A 03/19/2021   Procedure: ESOPHAGOGASTRODUODENOSCOPY (EGD) WITH PROPOFOL;  Surgeon: Lesly Rubenstein, MD;  Location: [facility] ENDOSCOPY;  Service: Endoscopy;  Laterality: N/A;   HERNIA REPAIR Right 2003   inguinal   INCISION AND DRAINAGE HIP Right 05/11/2020   Procedure: IRRIGATION AND DEBRIDEMENT HIP;  Surgeon: Hessie Knows, MD;  Location: [facility] ORS;  Service: Orthopedics;  Laterality: Right;   OVARIAN CYST REMOVAL  2001   right   TEMPOROMANDIBULAR JOINT SURGERY  2000   TOTAL HIP ARTHROPLASTY Right 12/28/2019   Procedure: TOTAL HIP ARTHROPLASTY ANTERIOR APPROACH;  Surgeon: Hessie Knows, MD;  Location: [facility] ORS;  Service: Orthopedics;  Laterality: Right;   TUBAL LIGATION     Bilateral   VULVA Milagros Loll BIOPSY  04/24/2022   Procedure: VULVAR BIOPSY;  Surgeon: Gillis Ends, MD;  Location: [facility] ORS;  Service: Gynecology;;    FAMILY HISTORY:  We obtained a detailed, 4-generation family history.  Significant diagnoses are listed below: Family History  Problem Relation Age of Onset   Hyperlipidemia Mother    Anxiety disorder Mother    Atrial fibrillation Mother    Hypertension Mother    Memory loss Mother    Anxiety disorder Father    Stroke Father    Hyperlipidemia Father    Hypertension Father    Congestive Heart Failure Father    Depression Father    Hyperlipidemia Brother    Colon cancer Paternal Aunt    Dementia Paternal Aunt    Dementia Maternal Grandmother    Cancer Maternal Grandmother        unk primary; d. late 79s   Dementia Paternal Grandmother    ADD / ADHD Daughter    Breast cancer Neg Hx    Kathleen Everett has 1 son, 1, and twin daughters, 70, no cancers. She has 1 brother, 73, no history of cancer.  Kathleen Everett mother is living at 1 with no histoyr of  cancer. Maternal grandmother had what sounds like metastatic cancer, unknown primary, and died in her late 49s of this.   Kathleen Everett father died at 1 of heart issues. A paternal aunt had colon cancer around age 93. Patient has no information about paternal grandfather/his relatives which is of concern to her.  Kathleen Everett is unaware of previous family history of genetic testing for hereditary cancer risks. There is no reported Ashkenazi Jewish ancestry. There is no known consanguinity.    GENETIC COUNSELING ASSESSMENT: Kathleen Everett is a 52 y.o. female with a family history which is not particularly suggestive of a hereditary cancer syndrome and predisposition to cancer. We, therefore, discussed and recommended the following at today's visit.   DISCUSSION: We discussed that approximately 10% of cancer is hereditary. Her family history is not concerning for a hereditary cancer condition. Kathleen Everett expressed concern about not knowing her paternal grandfather's history.  She can still consider testing if she is interested, it may not be covered by insurance. There are genes we can test associated with colon and other cancers. Cancers and risks are gene specific. We discussed that testing is beneficial for several reasons including knowing about cancer risks, identifying potential screening and risk-reduction options that may be appropriate, and to understand if other family members could be at risk for cancer and allow them to undergo genetic testing.   We reviewed the characteristics, features and inheritance patterns of hereditary cancer syndromes. We also discussed genetic testing, including the appropriate family members to test, the process of testing, insurance coverage and turn-around-time for results. We discussed the implications of a negative, positive and/or variant of uncertain significant result. Kathleen Everett does not meet medical criteria for testing. If she is  interested, we can order genetic testing for the [facility] Multi-Cancer+RNA gene panel.   Based on Kathleen Everett's family history of cancer, she does not meet medical criteria for genetic testing and may have an OOP cost of $250.   PLAN: After considering the risks, benefits, and limitations, Kathleen Everett provided informed consent to pursue genetic testing and the blood sample was sent to [facility] for analysis of the Multi-Cancer+RNA panel. Results should be available within approximately 2-3 weeks' time, at which point they will be disclosed by telephone to Kathleen Everett, as will any additional recommendations warranted by these results. Kathleen Everett will receive a summary of her genetic counseling visit and a copy of her results once available. This information will also be available in Epic.   Kathleen Everett questions were answered to her satisfaction today. Our contact information was provided should additional questions or concerns arise. Thank you for the referral and allowing [place] to share in the care of your patient.   Faith Rogue, MS, [facility] Genetic Counselor [email or web address] Phone: [phone]  The patient was seen for a total of 25 minutes in virtual genetic counseling.  Dr. Grayland Ormond was available for discussion regarding this case.   _______________________________________________________________________ For Office Staff:  Number of people involved in session: 2 Was an Intern/ student involved with case: yes - prospective genetic counseling student Camelia Phenes was present for observation.

## 2022-12-05 ENCOUNTER — Telehealth (HOSPITAL_BASED_OUTPATIENT_CLINIC_OR_DEPARTMENT_OTHER): Payer: Medicare HMO | Admitting: Psychiatry

## 2022-12-05 DIAGNOSIS — F411 Generalized anxiety disorder: Secondary | ICD-10-CM

## 2022-12-05 DIAGNOSIS — F331 Major depressive disorder, recurrent, moderate: Secondary | ICD-10-CM | POA: Diagnosis not present

## 2022-12-05 DIAGNOSIS — F5105 Insomnia due to other mental disorder: Secondary | ICD-10-CM | POA: Diagnosis not present

## 2022-12-05 DIAGNOSIS — F99 Mental disorder, not otherwise specified: Secondary | ICD-10-CM

## 2022-12-05 MED ORDER — NAC 500 MG PO CAPS: 500.0000 mg | ORAL_CAPSULE | Freq: Every day | ORAL | 0 refills | Status: DC

## 2022-12-05 MED ORDER — SERTRALINE HCL 100 MG PO TABS: 200.0000 mg | ORAL_TABLET | Freq: Every day | ORAL | 0 refills | Status: DC

## 2022-12-05 MED ORDER — MAGNESIUM GLYCINATE 100 MG PO CAPS: 1.0000 | ORAL_CAPSULE | Freq: Every day | ORAL | 0 refills | Status: DC

## 2022-12-05 NOTE — Progress Notes (Signed)
Virtual Visit via Video Note  I connected with Kathleen Everett on 12/05/22 at  3:30 PM EST by  a video enabled telemedicine application and verified that I am speaking with the correct person using two identifiers.  Location: Patient: home Provider: office   I discussed the limitations of evaluation and management by telemedicine and the availability of in person appointments. The patient expressed understanding and agreed to proceed.  History of Present Illness: "I'm ok". Kathleen Everett was not able to tolerate even 2.'5mg'$  of Zyprexa due to excessive fatigue. She had no energy or motivation to do anything so she stopped it. Kathleen Everett sleep is variable most nights. She is getting about 4.5 hrs of broken sleep on average. Most of the time she wakes up due to pain and sometimes to go the bathroom. She is unable to fall back asleep due to anxiety/racing thoughts or pain.  She sleeps a little better if she goes to bed around 10pm as opposed to 8pm. It is hard to stay up because she is exhausted. She usually does not nap during the day. The anxiety is a daily struggle. She has racing thoughts, palpitations, insomnia, inability to relax and poor focus. She is easily overwhelmed and has poor motivation to do much. Kathleen Everett is having several stressors- finances and her daughter being sick. The financial stress are making her feel down. She is having some negative self talk and is depressed every day. She has ongoing anhedonia. She has been in her pjs all week. Her appetite is ok. Kathleen Everett denies passive thoughts of death and SI/HI.    Observations/Objective: Psychiatric Specialty Exam: ROS  Last menstrual period 12/08/2006.There is no height or weight on file to calculate BMI.  General Appearance: Casual  Eye Contact:  Good  Speech:  Clear and Coherent and Normal Rate  Volume:  Normal  Mood:  Anxious and Depressed  Affect:  Congruent  Thought Process:  Goal Directed, Linear, and Descriptions of  Associations: Intact  Orientation:  Full (Time, Place, and Person)  Thought Content:  Logical  Suicidal Thoughts:  No  Homicidal Thoughts:  No  Memory:  Immediate;   Good  Judgement:  Good  Insight:  Good  Psychomotor Activity:  Normal  Concentration:  Concentration: Good  Recall:  Good  Fund of Knowledge:  Good  Language:  Good  Akathisia:  No  Handed:  Right  AIMS (if indicated):     Assets:  Communication Skills Desire for Improvement Financial Resources/Insurance Housing Intimacy Resilience Talents/Skills Transportation Vocational/Educational  ADL's:  Intact  Cognition:  WNL  Sleep:        Assessment and Plan:     12/05/2022    3:44 PM 10/22/2022    7:37 AM 10/03/2022    9:02 AM 09/05/2022    9:44 AM 07/19/2022    9:41 AM  Depression screen PHQ 2/9  Decreased Interest '3 2 3 2 2  '$ Down, Depressed, Hopeless '3 2 3 2 3  '$ PHQ - 2 Score '6 4 6 4 5  '$ Altered sleeping '3 1 3 3 2  '$ Tired, decreased energy '3 1 3 3 3  '$ Change in appetite 0 0 0 0 0  Feeling bad or failure about yourself  3 0 '1 1 3  '$ Trouble concentrating 3 0 '3 3 3  '$ Moving slowly or fidgety/restless 0 0 0 0 2  Suicidal thoughts 0 0 0 0 0  PHQ-9 Score '18 6 16 14 18  '$ Difficult doing work/chores Very  difficult Somewhat difficult Extremely dIfficult Very difficult Extremely dIfficult    Flowsheet Row Video Visit from 12/05/2022 in [facility] Video Visit from 10/03/2022 in [facility] Video Visit from 09/05/2022 in [place] No Risk No Risk No Risk          Pt is aware that these meds carry a teratogenic risk. Pt will discuss plan of action if she does or plans to become pregnant in the future.  Status of current problems: ongoing depression and anxiety   Medication management with supportive therapy. Risks and benefits, side effects and alternative treatment options  discussed with patient. Pt was given an opportunity to ask questions about medication, illness, and treatment. All current psychiatric medications have been reviewed and discussed with the patient and adjusted as clinically appropriate.  Pt verbalized understanding and verbal consent obtained for treatment.  Meds: d/c Zyprexa due to SE Increase Zoloft '200mg'$  po qD Start Magnesium Start NAC 1. MDD (major depressive disorder), recurrent episode, moderate (HCC) - sertraline (ZOLOFT) 100 MG tablet; Take 2 tablets (200 mg total) by mouth daily.  Dispense: 180 tablet; Refill: 0  2. Generalized anxiety disorder - sertraline (ZOLOFT) 100 MG tablet; Take 2 tablets (200 mg total) by mouth daily.  Dispense: 180 tablet; Refill: 0 - Magnesium Glycinate 100 MG CAPS; Take 1 tablet by mouth daily.  Dispense: 30 capsule; Refill: 0 - Acetylcysteine (NAC) 500 MG CAPS; Take 500 mg by mouth at bedtime.  Dispense: 30 capsule; Refill: 0  3. Insomnia due to other mental disorder     Labs: none    Therapy: brief supportive therapy provided. Discussed psychosocial stressors in detail.    Encouraged pt to develop daily routine and work on daily goal setting as a way to improve mood symptoms.    Reviewed sleep hygiene.     Collaboration of Care: Other none  Patient/Guardian was advised Release of Information must be obtained prior to any record release in order to collaborate their care with an outside provider. Patient/Guardian was advised if they have not already done so to contact the registration department to sign all necessary forms in order for [place] to release information regarding their care.   Consent: Patient/Guardian gives verbal consent for treatment and assignment of benefits for services provided during this visit. Patient/Guardian expressed understanding and agreed to proceed.      Follow Up Instructions: Follow up in 2 months or sooner if needed    I discussed the assessment and treatment  plan with the patient. The patient was provided an opportunity to ask questions and all were answered. The patient agreed with the plan and demonstrated an understanding of the instructions.   The patient was advised to call back or seek an in-person evaluation if the symptoms worsen or if the condition fails to improve as anticipated.  I provided 20 minutes of non-face-to-face time during this encounter.   Charlcie Cradle, MD

## 2022-12-18 ENCOUNTER — Inpatient Hospital Stay: Payer: Medicare HMO

## 2022-12-18 ENCOUNTER — Inpatient Hospital Stay (HOSPITAL_BASED_OUTPATIENT_CLINIC_OR_DEPARTMENT_OTHER): Payer: Medicare HMO | Admitting: Obstetrics and Gynecology

## 2022-12-18 VITALS — BP 127/74 | HR 72 | Temp 98.7°F | Resp 20 | Wt 214.5 lb

## 2022-12-18 DIAGNOSIS — N9089 Other specified noninflammatory disorders of vulva and perineum: Secondary | ICD-10-CM

## 2022-12-18 DIAGNOSIS — Z87411 Personal history of vaginal dysplasia: Secondary | ICD-10-CM | POA: Diagnosis not present

## 2022-12-18 DIAGNOSIS — N901 Moderate vulvar dysplasia: Secondary | ICD-10-CM | POA: Insufficient documentation

## 2022-12-18 DIAGNOSIS — Z87412 Personal history of vulvar dysplasia: Secondary | ICD-10-CM | POA: Diagnosis not present

## 2022-12-18 DIAGNOSIS — N900 Mild vulvar dysplasia: Secondary | ICD-10-CM | POA: Diagnosis not present

## 2022-12-18 NOTE — Progress Notes (Signed)
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 Theora Gianotti on 04/24/22. She returns to clinic for concern to have a vulvar biopsy.   She c/o persistent vaginal irritation along the left vulva and introitus similar to prior complaints.  She is due for her Pap smear today  06/12/2022 DIAGNOSIS:  A. VULVA, LEFT; BIOPSY:  - BENIGN SQUAMOUS MUCOSA WITH FOCAL INFLAMMATION AND REACTIVE CELLULAR  CHANGES.  - NEGATIVE FOR SQUAMOUS INTRAEPITHELIAL LESION AND MALIGNANCY.    Per Elmo Putt [place] it looks like she had a LEEP in 2008 by Dr. Georga Bora.   Gynecologic History:  She presented with pain and slow healing hole in LT labia minora; sx off and on for over a year. Has had neg yeast and BV cultures, neg HSV 2 testing. Pt has tried nystatin crm, estrace crm, coconut oil, and vaseline without full relief. She couldn't tolerate lotrisone cream.   03/04/2022 On exam noted to have anterior vulvar lesion that was biopsied. 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   Non-smoker, No abnormal Pap - last Pap 06/24/2019 NIML satisfactory with transformation zone. She doesn't recall a h/o abnormal Pap but thinks she had a LEEP in 2011.   On 04/24/22 she underwent EUA, Colpo of vulva, ablation with bovie, biopsy, and periurethral biopsy. Exam under anesthesia and colposcopy of the vulva after application of acetic acid revealed 20 x 0.8 mm acetowhite epithelium involving the vulvar tissue adjacent to the urethra. The vagina and cervix were normal. There were no lesion. Bimanual exam was negative for masses or nodularity. All visible vulvar lesions were ablated.   DIAGNOSIS:  A.  VULVA, INFERIOR; BIOPSY: 1:00 (0.7 x 0.2 x 0.2  cm) - HIGH-GRADE SQUAMOUS INTRAEPITHELIAL LESION (HSIL/VIN-2).   B.  PERI URETHRA; BIOPSY:  - HIGH-GRADE SQUAMOUS INTRAEPITHELIAL LESION (HSIL/VIN-2).  These areas were completely ablated.   She represented to clinic with pain/irritation lower on the vulva adjacent the the Bartholin's gland duct.    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   Ear fullness 02/09/2022   Runny nose 09/30/2021   Dizziness 07/22/2021   Vitamin B12 deficiency    Vaginal irritation 03/03/2021   Soft tissue mass 03/03/2021   Hemorrhoids 12/23/2020   DDD (degenerative disc disease), lumbar 10/03/2020   Abscess of right hip 05/11/2020   Healthcare maintenance 01/02/2020   Anemia 01/02/2020   Status post total hip replacement, 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   Hyperglycemia 06/24/2019   Vaginal bleeding 09/30/2016   Primary osteoarthritis of right hip 01/17/2016   Arnold-Chiari malformation (HCC)    Chronic gastritis 07/18/2015   Depression, major, in remission ([place]) 05/01/2015   Generalized anxiety disorder 05/01/2015   Chronic tension-type headache, intractable 04/24/2015   Dysmenorrhea 04/24/2015   Endometriosis 04/24/2015   Hidradenitis 04/24/2015   Hot flashes 03/12/2015   Chest pain 02/15/2015   BMI 35.0-35.9,adult 10/16/2014   Cough 09/28/2014   Breast pain, right 09/08/2014  Abdominal pain 07/24/2014   GERD (gastroesophageal reflux disease) 04/24/2014   Ankle pain, right 04/24/2014   Vitamin D deficiency 01/18/2014   Diverticulitis 01/10/2014   Hypercholesteremia 01/10/2014   Right hip pain 01/10/2014   Migraine headaches 01/10/2014   Obstructive sleep apnea 01/10/2014   Chronic back  pain 12/13/2012   Mitral valve prolapse 12/13/2012    Past Medical History: Past Medical History:  Diagnosis Date   Abdominal pain 07/24/2014   Abscess of right hip 05/11/2020   Anemia 01/02/2020   Ankle pain, right 04/24/2014   Breast pain, right 09/08/2014   Chest pain 02/15/2015   Chiari malformation type I 01/26/2014   s/p sgy decompression   Chronic back pain 12/13/2012   Chronic gastritis 07/18/2015   Chronic tension-type headache, intractable 04/24/2015   DDD (degenerative disc disease), lumbar    Diverticulitis    Dysmenorrhea    Dysplastic nevus 10/09/2015   R abdomen - moderate, limited margins free.   Endometriosis    Generalized anxiety disorder 05/01/2015   Has previously seen psychiatry. Having some issues with what she feels is related to stress and anxiety   GERD (gastroesophageal reflux disease)    Hemorrhoids 12/23/2020   Hidradenitis    History of ovarian cyst    Hot flashes 03/12/2015   Hypercholesteremia 01/10/2014   Off lipitor. Elevated cholesterol. Labs discussed. Restart lipitor. Follow lipid panel and liver function tests.   Hyperglycemia 06/24/2019   Major depressive disorder 05/01/2015   Migraine headaches 01/10/2014   Has seen neurology. Topamax.   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 Level is 21.6    Past Surgical History: Past Surgical History:  Procedure Laterality Date   ABDOMINAL HYSTERECTOMY  03/27/07   laparoscopic   BREAST EXCISIONAL BIOPSY Left 2004   fibroadenoma   CESAREAN  SECTION     1999/2004 twins   chiari decompression     COLONOSCOPY WITH PROPOFOL N/A 04/28/2015   Procedure: COLONOSCOPY WITH PROPOFOL;  Surgeon: Manya Silvas, MD;  Location: [facility] ENDOSCOPY;  Service: Endoscopy;  Laterality: N/A;   COLONOSCOPY WITH PROPOFOL N/A 03/19/2021   Procedure: COLONOSCOPY WITH PROPOFOL;  Surgeon: Lesly Rubenstein, MD;  Location: [facility] ENDOSCOPY;  Service: Endoscopy;  Laterality: N/A;   COLPOSCOPY N/A 04/24/2022   Procedure: COLPOSCOPY, ABLATION OF VULVAR TISSUE;  Surgeon: Gillis Ends, MD;  Location: [facility] ORS;  Service: Gynecology;  Laterality: N/A;   CYSTOSCOPY     bladder ulcers   ESOPHAGOGASTRODUODENOSCOPY N/A 04/28/2015   Procedure: ESOPHAGOGASTRODUODENOSCOPY (EGD);  Surgeon: Manya Silvas, MD;  Location: [facility] ENDOSCOPY;  Service: Endoscopy;  Laterality: N/A;   ESOPHAGOGASTRODUODENOSCOPY (EGD) WITH PROPOFOL N/A 03/19/2021   Procedure: ESOPHAGOGASTRODUODENOSCOPY (EGD) WITH PROPOFOL;  Surgeon: Lesly Rubenstein, MD;  Location: [facility] ENDOSCOPY;  Service: Endoscopy;  Laterality: N/A;   HERNIA REPAIR Right 2003   inguinal   INCISION AND DRAINAGE HIP Right 05/11/2020   Procedure: IRRIGATION AND DEBRIDEMENT HIP;  Surgeon: Hessie Knows, MD;  Location: [facility]  ORS;  Service: Orthopedics;  Laterality: Right;   OVARIAN CYST REMOVAL  2001   right   TEMPOROMANDIBULAR JOINT SURGERY  2000   TOTAL HIP ARTHROPLASTY Right 12/28/2019   Procedure: TOTAL HIP ARTHROPLASTY ANTERIOR APPROACH;  Surgeon: Hessie Knows, MD;  Location: [facility] ORS;  Service: Orthopedics;  Laterality: Right;   TUBAL LIGATION     Bilateral   VULVA /PERINEUM BIOPSY  04/24/2022   Procedure: VULVAR BIOPSY;  Surgeon: Gillis Ends, MD;  Location: [facility] ORS;  Service: Gynecology;;   Past Gynecologic History:  Menarche: 14 Menstrual details: n/a History of Abnormal pap: as per HPI Last pap: 06/24/2019 NIML satisfactory with transformation zone.  Sexually active: yes  OB History:  OB History      Gravida  2   Para  2   Term  2   Preterm      AB      Living  3      SAB      IAB      Ectopic      Multiple  1   Live Births  3          Family History: Family History  Problem Relation Age of Onset   Hyperlipidemia Mother    Anxiety disorder Mother    Atrial fibrillation Mother    Hypertension Mother    Memory loss Mother    Anxiety disorder Father    Stroke Father    Hyperlipidemia Father    Hypertension Father    Congestive Heart Failure Father    Depression Father    Hyperlipidemia Brother    Colon cancer Paternal Aunt    Dementia Paternal Aunt    Dementia Maternal Grandmother    Cancer Maternal Grandmother        unk primary; d. late 73s   Dementia Paternal Grandmother    ADD / ADHD Daughter    Breast cancer Neg Hx    Social History: Social History   Tobacco Use   Smoking status: Never   Smokeless tobacco: Never  Vaping Use   Vaping Use: Never used  Substance Use Topics   Alcohol use: Not Currently    Alcohol/week: 0.0 standard drinks of alcohol   Drug use: No    Allergies: Allergies  Allergen Reactions   Vancomycin Hives   Ceftin [Cefuroxime Axetil] Other (See Comments)    Upset stomach   Celexa [Citalopram Hydrobromide] Diarrhea    GI upset     Clindamycin/Lincomycin Rash   Penicillins Rash    Did it involve swelling of the face/tongue/throat, SOB, or low BP? No Did it involve sudden or severe rash/hives, skin peeling, or any reaction on the inside of your mouth or nose? Unknown Did you need to seek medical attention at a hospital or doctor's office? No When did it last happen? childhood reaction If all above answers are "NO", may proceed with cephalosporin use.    Sulfa Antibiotics Rash   Current Medications: Current Outpatient Medications on File Prior to Visit  Medication Sig Dispense Refill   acetaminophen (TYLENOL) 325 MG tablet Take 500 mg by mouth every 6 (six) hours as needed.     Acetylcysteine (NAC) 500 MG  CAPS Take 500 mg by mouth at bedtime. 30 capsule 0   AIMOVIG 140 MG/ML SOAJ Inject 140 mg into the skin every 28 (twenty-eight) days.     atorvastatin (LIPITOR) 10 MG tablet Take 1 tablet (10 mg total) by mouth daily. 90 tablet 1   azelastine (  ASTELIN) 0.1 % nasal spray Place 1 spray into both nostrils 2 (two) times daily. Use in each nostril as directed 30 mL 1   clobetasol (TEMOVATE) 0.05 % external solution Apply topically daily as needed.     Crisaborole (EUCRISA) 2 % OINT Apply twice a day to hands prn flares 60 g 2   cyanocobalamin (VITAMIN B12) 1000 MCG/ML injection INJECT 1 ML INTRAMUSCULARLY ONCE EVERY MONTH 10 mL 0   diclofenac Sodium (VOLTAREN) 1 % GEL SMARTSIG:Gram(s) Topical 4 Times Daily PRN     esomeprazole (NEXIUM) 40 MG capsule Take 1 capsule by mouth once daily 90 capsule 3   lactase (RA DAIRY AID) 3000 units tablet Take 9,000 Units by mouth 3 (three) times daily as needed (with dairy consumption).      Magnesium Glycinate 100 MG CAPS Take 1 tablet by mouth daily. 30 capsule 0   methocarbamol (ROBAXIN) 500 MG tablet Take 500 mg by mouth 4 (four) times daily.     mupirocin ointment (BACTROBAN) 2 % Apply to healing wound TID. 22 g 1   nystatin cream (MYCOSTATIN) Apply topically 2 (two) times daily. 30 g 0   oxyCODONE-acetaminophen (PERCOCET) 10-325 MG tablet Take by mouth.     rizatriptan (MAXALT) 10 MG tablet Take 10 mg by mouth as needed for migraine. May repeat in 2 hours if needed     Ruxolitinib Phosphate (OPZELURA) 1.5 % CREA Apply twice daily to hands and feet 60 g 3   Semaglutide, 2 MG/DOSE, (OZEMPIC, 2 MG/DOSE,) 8 MG/3ML SOPN INJECT '2MG'$  AS DIRECTED ONCE WEEKLY 3 mL 3   sertraline (ZOLOFT) 100 MG tablet Take 2 tablets (200 mg total) by mouth daily. 180 tablet 0   Simethicone (GAS RELIEF PO) Take 125 mg by mouth 2 (two) times daily as needed for flatulence. Anti gas     tacrolimus (PROTOPIC) 0.1 % ointment APPLY TOPICALLY TWO (2) (TWO) TIMES DAILY. 100 g 2   topiramate  (TOPAMAX) 25 MG capsule Take 25 mg by mouth 2 (two) times daily.     Ubrogepant (UBRELVY) 100 MG TABS Take 100 mg by mouth 2 (two) times daily as needed.     No current facility-administered medications on file prior to visit.    Review of Systems Limited to Gyn symptoms.  As noted per interval history.  She has no complaints of vaginal spotting or bleeding.  Objective:  Physical Examination:  Today's Vitals   12/18/22 1048  BP: 127/74  Pulse: 72  Resp: 20  Temp: 98.7 F (37.1 C)  SpO2: 100%  Weight: 214 lb 8 oz (97.3 kg)    Body mass index is 30.78 kg/m.  ECOG Performance Status: 0 - Asymptomatic  GENERAL: Patient is a well appearing female in no acute distress HEENT:  PERRL, neck supple with midline trachea.  NODES:  No inguinal lymphadenopathy palpated.  ABDOMEN:  Soft, nontender.  No ascites masses or distention EXTREMITIES:  No peripheral edema.   NEURO:  Nonfocal. Well oriented.  Appropriate affect.  Pelvic: chaperoned by CMA and RN EGBUS: no gross lesions; Area of prior surgery depigmented.  Cervix: no lesions, nontender, mobile, stenotic os. Pap obtained.  Vagina: no lesions, no discharge or bleeding. Positive for atrophy Uterus: surgically absent BME: no palpable masses    Vulvar Colposcopy and Vulvar Biopsy The risks and benefits of the procedure were reviewed and informed consent obtained. Time out was performed. The patient received pre-procedure teaching and expressed understanding. The post-procedure instructions were reviewed with the patient and  she expressed understanding. The patient does not have any barriers to learning.  AWE noted on the left at site of prior ablation and also more medially upper vaginal introitus inferior to urethra. That area had punctation as well. On the right at 11 o'clock there was slight AWE. She is not symptomatic in that area.  The area was numbed with topical lidocaine gel and 2% lidocaine, 1 cc. A biopsy forceps was used  to obtained two biopsies. Inner left introitus at 1 o'clock and more lateral lesion at 2-3 o'clock.  Hemostasis was obtained with silver nitrate. She tolerated the procedure well.    Lab Review No labs on site today.   Radiologic Imaging: N/a    Assessment:  Kathleen Everett is a 52 y.o. 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 Symptomatic with vulvar irritation, colposcopy more c/w atrophy than VIN. Biopsies obtained.   Medical co-morbidities complicating care: Chiari malformation type I, Sleep apnea, hyperglycemia, prior laparotomy Plan:   Problem List Items Addressed This Visit       Genitourinary   Vulvar dysplasia - Primary   Follow up biopsy results and determine management on findings. Follow up Pap/HPV testing.   I personally had a face to face interaction and evaluated the patient; performed the physical exam including pelvic exam, colposcopy, and biopsy.  Counseling was completed by me.    Gaynor Ferreras Gaetana Michaelis, MD

## 2022-12-20 LAB — SURGICAL PATHOLOGY

## 2022-12-21 LAB — IGP, APTIMA HPV: HPV Aptima: NEGATIVE

## 2022-12-23 ENCOUNTER — Encounter: Payer: Self-pay | Admitting: Internal Medicine

## 2022-12-24 ENCOUNTER — Other Ambulatory Visit: Payer: Self-pay

## 2022-12-25 ENCOUNTER — Other Ambulatory Visit: Payer: Self-pay | Admitting: Internal Medicine

## 2022-12-25 ENCOUNTER — Other Ambulatory Visit: Payer: Self-pay

## 2022-12-25 DIAGNOSIS — Z6835 Body mass index (BMI) 35.0-35.9, adult: Secondary | ICD-10-CM

## 2022-12-25 DIAGNOSIS — R739 Hyperglycemia, unspecified: Secondary | ICD-10-CM

## 2022-12-25 MED ORDER — OZEMPIC (2 MG/DOSE) 8 MG/3ML ~~LOC~~ SOPN: PEN_INJECTOR | SUBCUTANEOUS | 3 refills | Status: DC

## 2022-12-26 ENCOUNTER — Telehealth: Payer: Self-pay

## 2022-12-26 ENCOUNTER — Other Ambulatory Visit (HOSPITAL_COMMUNITY): Payer: Self-pay

## 2022-12-26 ENCOUNTER — Other Ambulatory Visit: Payer: Self-pay | Admitting: Internal Medicine

## 2022-12-26 DIAGNOSIS — M47817 Spondylosis without myelopathy or radiculopathy, lumbosacral region: Secondary | ICD-10-CM | POA: Diagnosis not present

## 2022-12-26 NOTE — Telephone Encounter (Signed)
Pharmacy Patient Advocate Encounter   Received notification from [facility] that prior authorization for Ozempic '8mg'$ /84m is required/requested.  Per Test Claim: plan exclusion   PA submitted on 12/26/22 to (ins) [employer] via CoverMyMeds Key [id number] Status is pending

## 2022-12-26 NOTE — Telephone Encounter (Signed)
Noted  

## 2022-12-27 ENCOUNTER — Other Ambulatory Visit: Payer: Self-pay | Admitting: Internal Medicine

## 2022-12-27 DIAGNOSIS — R739 Hyperglycemia, unspecified: Secondary | ICD-10-CM

## 2022-12-27 DIAGNOSIS — Z6835 Body mass index (BMI) 35.0-35.9, adult: Secondary | ICD-10-CM

## 2022-12-30 ENCOUNTER — Ambulatory Visit: Payer: Self-pay | Admitting: Licensed Clinical Social Worker

## 2022-12-30 ENCOUNTER — Encounter: Payer: Self-pay | Admitting: Licensed Clinical Social Worker

## 2022-12-30 ENCOUNTER — Encounter: Payer: Self-pay | Admitting: Nurse Practitioner

## 2022-12-30 ENCOUNTER — Telehealth: Payer: Self-pay | Admitting: Licensed Clinical Social Worker

## 2022-12-30 ENCOUNTER — Other Ambulatory Visit: Payer: Self-pay | Admitting: Nurse Practitioner

## 2022-12-30 DIAGNOSIS — N901 Moderate vulvar dysplasia: Secondary | ICD-10-CM

## 2022-12-30 DIAGNOSIS — Z1379 Encounter for other screening for genetic and chromosomal anomalies: Secondary | ICD-10-CM

## 2022-12-30 MED ORDER — IMIQUIMOD 5 % EX CREA: TOPICAL_CREAM | CUTANEOUS | 0 refills | Status: DC

## 2022-12-30 NOTE — Progress Notes (Signed)
HPI:   Kathleen Everett was previously seen in the [place] clinic due to a family history of cancer and concerns regarding a hereditary predisposition to cancer. Please refer to our prior cancer genetics clinic note for more information regarding our discussion, assessment and recommendations, at the time. Kathleen Everett recent genetic test results were disclosed to Kathleen Everett, as were recommendations warranted by these results. These results and recommendations are discussed in more detail below.  CANCER HISTORY:  Oncology History   No history exists.    FAMILY HISTORY:  We obtained a detailed, 4-generation family history.  Significant diagnoses are listed below: Family History  Problem Relation Age of Onset   Hyperlipidemia Mother    Anxiety disorder Mother    Atrial fibrillation Mother    Hypertension Mother    Memory loss Mother    Anxiety disorder Father    Stroke Father    Hyperlipidemia Father    Hypertension Father    Congestive Heart Failure Father    Depression Father    Hyperlipidemia Brother    Colon cancer Paternal Aunt    Dementia Paternal Aunt    Dementia Maternal Grandmother    Cancer Maternal Grandmother        unk primary; d. late 11s   Dementia Paternal Grandmother    ADD / ADHD Daughter    Breast cancer Neg Hx     Kathleen Everett has 1 son, 25, and twin daughters, 6, no cancers. She has 1 brother, 10, no history of cancer.   Kathleen Everett mother is living at 52 with no histoyr of cancer. Maternal grandmother had what sounds like metastatic cancer, unknown primary, and died in Kathleen Everett late 78s of this.    Kathleen Everett father died at 34 of heart issues. A paternal aunt had colon cancer around age 64. Patient has no information about paternal grandfather/his relatives which is of concern to Kathleen Everett.   Kathleen Everett is unaware of previous family history of genetic testing for hereditary cancer risks. There is no reported Ashkenazi Jewish  ancestry. There is no known consanguinity.     GENETIC TEST RESULTS:  The [facility] Multi-Cancer+RNA Panel found no pathogenic mutations.   The Multi-Cancer + RNA Panel offered by [facility] includes sequencing and/or deletion/duplication analysis of the following 70 genes:  AIP*, ALK, APC*, ATM*, AXIN2*, BAP1*, BARD1*, BLM*, BMPR1A*, BRCA1*, BRCA2*, BRIP1*, CDC73*, CDH1*, CDK4, CDKN1B*, CDKN2A, CHEK2*, CTNNA1*, DICER1*, EPCAM, EGFR, FH*, FLCN*, GREM1, HOXB13, KIT, LZTR1, MAX*, MBD4, MEN1*, MET, MITF, MLH1*, MSH2*, MSH3*, MSH6*, MUTYH*, NF1*, NF2*, NTHL1*, PALB2*, PDGFRA, PMS2*, POLD1*, POLE*, POT1*, PRKAR1A*, PTCH1*, PTEN*, RAD51C*, RAD51D*, RB1*, RET, SDHA*, SDHAF2*, SDHB*, SDHC*, SDHD*, SMAD4*, SMARCA4*, SMARCB1*, SMARCE1*, STK11*, SUFU*, TMEM127*, TP53*, TSC1*, TSC2*, VHL*. RNA analysis is performed for * genes..   The test report has been scanned into EPIC and is located under the Molecular Pathology section of the Results Review tab.  A portion of the result report is included below for reference. Genetic testing reported out on 12/27/2022.     Even though a pathogenic variant was not identified, possible explanations for the cancer in the family may include: There may be no hereditary risk for cancer in the family. The cancers in Kathleen Everett and/or Kathleen Everett family may be sporadic/familial or due to other genetic and environmental factors. There may be a gene mutation in one of these genes that current testing methods cannot detect but that chance is small. There could be another gene that has not yet been discovered, or  that we have not yet tested, that is responsible for the cancer diagnoses in the family.  It is also possible there is a hereditary cause for the cancer in the family that Kathleen Everett did not inherit.  Therefore, it is important to remain in touch with cancer genetics in the future so that we can continue to offer Kathleen Everett the most up to date genetic testing.    ADDITIONAL GENETIC TESTING:  We discussed with Kathleen Everett that Kathleen Everett genetic testing was fairly extensive.  If there are additional relevant genes identified to increase cancer risk that can be analyzed in the future, we would be happy to discuss and coordinate this testing at that time.    CANCER SCREENING RECOMMENDATIONS:  Kathleen Everett test result is considered negative (normal).  This means that we have not identified a hereditary cause for Kathleen Everett family history of cancer at this time.   An individual's cancer risk and medical management are not determined by genetic test results alone. Overall cancer risk assessment incorporates additional factors, including personal medical history, family history, and any available genetic information that may result in a personalized plan for cancer prevention and surveillance. Therefore, it is recommended she continue to follow the cancer management and screening guidelines provided by Kathleen Everett primary healthcare provider.  RECOMMENDATIONS FOR FAMILY MEMBERS:   Since she did not inherit a identifiable mutation in a cancer predisposition gene included on this panel, Kathleen Everett children could not have inherited a known mutation from Kathleen Everett in one of these genes. Individuals in this family might be at some increased risk of developing cancer, over the general population risk, due to the family history of cancer.  Individuals in the family should notify their providers of the family history of cancer. We recommend women in this family have a yearly mammogram beginning at age 10, or 11 years younger than the earliest onset of cancer, an annual clinical breast exam, and perform monthly breast self-exams.  Family members should have colonoscopies by at age 48, or earlier, as recommended by their providers.  FOLLOW-UP:  Lastly, we discussed with Kathleen Everett that cancer genetics is a rapidly advancing field and it is possible that new genetic tests will be appropriate for  Kathleen Everett and/or Kathleen Everett family members in the future. We encouraged Kathleen Everett to remain in contact with cancer genetics on an annual basis so we can update Kathleen Everett personal and family histories and let Kathleen Everett know of advances in cancer genetics that may benefit this family.   Our contact number was provided. Kathleen Everett questions were answered to Kathleen Everett satisfaction, and she knows she is welcome to call us at anytime with additional questions or concerns.    Faith Rogue, MS, [facility] Genetic Counselor [email or web address] Phone: [phone]

## 2022-12-30 NOTE — Telephone Encounter (Signed)
I contacted Ms. Crays to discuss her genetic testing results. No pathogenic variants were identified in the 70 genes analyzed. Detailed clinic note to follow.   The test report has been scanned into EPIC and is located under the Molecular Pathology section of the Results Review tab.  A portion of the result report is included below for reference.      Faith Rogue, MS, [facility] Genetic Counselor [email or web address] Phone: [phone]

## 2022-12-30 NOTE — Progress Notes (Signed)
Spoke to patient by phone. Reviewed biopsy results which were reassuring however, some low grade dysplasia. Given that she is symptomatic, Dr Theora Gianotti has recommended topical treatment with aldara. Reviewed rationale for use with patient. She's agreeable. Reviewed use and application directions in detail. Plan to treat 3 times a week for 16 weeks then re-evaluate in clinic with possible colposcopy. I'll see her in 2 weeks to assess tolerance.

## 2022-12-30 NOTE — Telephone Encounter (Signed)
Received a fax regarding Prior Authorization from [facility] for [place] 35m/3ml.  Authorization has been DENIED because uou asked for the drug above for your hyperglycemia. This is an off-label use that is not medically accepted. The Medicare rule in the Prescription Drug Benefit Manual (Chapter 6, Section 10.6) says a drug must be used for a medically accepted indication (covered use). Off-label use is medically accepted when there is proof in one or more of the drug guides that the drug works for your condition. We look at the two major drug guides (compendia): the [place] the [place](AHFS-DI). [employer] has decided that there is no proof in either drug guide that this drug works for your condition. Per Medicare rules, it is not covered. Additionally, the Medicare rule in the Prescription Drug Manual (Chapter 6, Section 20.1) says drugs used to help you lose weight are excluded from Medicare Part D coverage. [employer] follows Medicare rules. The information we have about your case says your drug is being used for weight loss and per Medicare rules isnt covered..  Denial letter attached to chart

## 2023-01-06 ENCOUNTER — Other Ambulatory Visit: Payer: Self-pay

## 2023-01-06 DIAGNOSIS — Z6835 Body mass index (BMI) 35.0-35.9, adult: Secondary | ICD-10-CM

## 2023-01-06 NOTE — Telephone Encounter (Signed)
Pt agreeable to referral to [facility] for weightloss. [id number] sent

## 2023-01-06 NOTE — Telephone Encounter (Signed)
  Pt requesting to try new med, ozempic denied due to no dx diabetes.

## 2023-01-06 NOTE — Telephone Encounter (Signed)
Given that she is not diabetic, the choices would be wegovy or zepbound.  (These are for weight loss).  If agreeable can place order to [facility] for weight loss and help with medication management.

## 2023-01-10 ENCOUNTER — Inpatient Hospital Stay: Payer: Medicare HMO | Admitting: Nurse Practitioner

## 2023-01-16 ENCOUNTER — Inpatient Hospital Stay: Payer: Medicare HMO | Admitting: Nurse Practitioner

## 2023-01-20 NOTE — Telephone Encounter (Signed)
Ok to refer to [facility]

## 2023-01-21 ENCOUNTER — Ambulatory Visit (INDEPENDENT_AMBULATORY_CARE_PROVIDER_SITE_OTHER): Payer: Medicare HMO | Admitting: Internal Medicine

## 2023-01-21 ENCOUNTER — Encounter: Payer: Self-pay | Admitting: Internal Medicine

## 2023-01-21 VITALS — BP 118/72 | HR 79 | Temp 98.2°F | Resp 16 | Ht 70.0 in | Wt 218.4 lb

## 2023-01-21 DIAGNOSIS — F325 Major depressive disorder, single episode, in full remission: Secondary | ICD-10-CM | POA: Diagnosis not present

## 2023-01-21 DIAGNOSIS — Z713 Dietary counseling and surveillance: Secondary | ICD-10-CM

## 2023-01-21 DIAGNOSIS — M545 Low back pain, unspecified: Secondary | ICD-10-CM | POA: Diagnosis not present

## 2023-01-21 DIAGNOSIS — F411 Generalized anxiety disorder: Secondary | ICD-10-CM | POA: Diagnosis not present

## 2023-01-21 DIAGNOSIS — E7800 Pure hypercholesterolemia, unspecified: Secondary | ICD-10-CM

## 2023-01-21 DIAGNOSIS — D649 Anemia, unspecified: Secondary | ICD-10-CM | POA: Diagnosis not present

## 2023-01-21 DIAGNOSIS — K219 Gastro-esophageal reflux disease without esophagitis: Secondary | ICD-10-CM

## 2023-01-21 DIAGNOSIS — Q0700 Arnold-Chiari syndrome without spina bifida or hydrocephalus: Secondary | ICD-10-CM | POA: Diagnosis not present

## 2023-01-21 DIAGNOSIS — N903 Dysplasia of vulva, unspecified: Secondary | ICD-10-CM

## 2023-01-21 DIAGNOSIS — R739 Hyperglycemia, unspecified: Secondary | ICD-10-CM

## 2023-01-21 DIAGNOSIS — G43809 Other migraine, not intractable, without status migrainosus: Secondary | ICD-10-CM

## 2023-01-21 DIAGNOSIS — Z6835 Body mass index (BMI) 35.0-35.9, adult: Secondary | ICD-10-CM

## 2023-01-21 DIAGNOSIS — G8929 Other chronic pain: Secondary | ICD-10-CM

## 2023-01-21 NOTE — Telephone Encounter (Signed)
Referral placed.

## 2023-01-21 NOTE — Progress Notes (Signed)
Subjective:    Patient ID: Kathleen Everett, female    DOB: 04-Nov-1971, 52 y.o.   MRN: [id number]  Patient here for  Chief Complaint  Patient presents with   Medical Management of Chronic Issues    HPI Here to follow up regarding blood sugar, GERD and cholesterol.  Seeing neurology for migraines - ubrelvy, aimovig and rizatriptan.   Last evaluated 11/25/22.  Taking topamax bid. Per note, completed speech therapy/cognitive training. Continue home exercises.  Ongoing issues with some imbalance.  They discussed EMG/NCS.  Hold. Seeing psychiatry for depression. Fluctuating symptoms, but overall stable.   S/p genetic testing.  No pathologic variants.  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 Prescribed aldara.  Reports increased stress with pain, finances, etc.  Discussed.  Persistent pain.  Has f/u next week to discuss.  Did have injury - dog - hit her face.  Some bruising and soreness under left eye.  Has improved.  EOMI.  No chest pain or sob reported.  No abdominal pain.  Taking benefiber to keep bowels moving.  Started back on topamax. Discussed weight loss.  Had to stop ozempic.  Does not have diagnosis of diabetes.     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   Endometriosis    Generalized anxiety disorder 05/01/2015   Has previously seen psychiatry. Having some issues with what she feels is related to stress and anxiety   GERD (gastroesophageal reflux disease)     Hemorrhoids 12/23/2020   Hidradenitis    History of ovarian cyst    Hot flashes 03/12/2015   Hypercholesteremia 01/10/2014   Off lipitor. Elevated cholesterol. Labs discussed. Restart lipitor. Follow lipid panel and liver function tests.   Hyperglycemia 06/24/2019   Major depressive disorder 05/01/2015   Migraine headaches 01/10/2014   Has seen neurology. Topamax.   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 Level is 21.6   Past Surgical History:  Procedure Laterality Date   ABDOMINAL HYSTERECTOMY  03/27/07   laparoscopic   BREAST EXCISIONAL BIOPSY Left 2004   fibroadenoma   CESAREAN SECTION     1999/2004 twins   chiari decompression     COLONOSCOPY WITH PROPOFOL N/A 04/28/2015   Procedure: COLONOSCOPY WITH PROPOFOL;  Surgeon: Manya Silvas, MD;  Location: [facility] ENDOSCOPY;  Service: Endoscopy;  Laterality: N/A;   COLONOSCOPY WITH PROPOFOL N/A 03/19/2021   Procedure: COLONOSCOPY WITH PROPOFOL;  Surgeon: Andrey Farmer  T, MD;  Location: [facility] ENDOSCOPY;  Service: Endoscopy;  Laterality: N/A;   COLPOSCOPY N/A 04/24/2022   Procedure: COLPOSCOPY, ABLATION OF VULVAR TISSUE;  Surgeon: Gillis Ends, MD;  Location: [facility] ORS;  Service: Gynecology;  Laterality: N/A;   CYSTOSCOPY     bladder ulcers   ESOPHAGOGASTRODUODENOSCOPY N/A 04/28/2015   Procedure: ESOPHAGOGASTRODUODENOSCOPY (EGD);  Surgeon: Manya Silvas, MD;  Location: [facility] ENDOSCOPY;  Service: Endoscopy;  Laterality: N/A;   ESOPHAGOGASTRODUODENOSCOPY (EGD) WITH PROPOFOL  N/A 03/19/2021   Procedure: ESOPHAGOGASTRODUODENOSCOPY (EGD) WITH PROPOFOL;  Surgeon: Lesly Rubenstein, MD;  Location: [facility] ENDOSCOPY;  Service: Endoscopy;  Laterality: N/A;   HERNIA REPAIR Right 2003   inguinal   INCISION AND DRAINAGE HIP Right 05/11/2020   Procedure: IRRIGATION AND DEBRIDEMENT HIP;  Surgeon: Hessie Knows, MD;  Location: [facility] ORS;  Service: Orthopedics;  Laterality: Right;   OVARIAN CYST REMOVAL  2001   right   TEMPOROMANDIBULAR JOINT SURGERY  2000   TOTAL HIP ARTHROPLASTY Right 12/28/2019   Procedure: TOTAL HIP ARTHROPLASTY ANTERIOR APPROACH;  Surgeon: Hessie Knows, MD;  Location: [facility] ORS;  Service: Orthopedics;  Laterality: Right;   TUBAL LIGATION     Bilateral   VULVA /PERINEUM BIOPSY  04/24/2022   Procedure: VULVAR BIOPSY;  Surgeon: Gillis Ends, MD;  Location: [facility] ORS;  Service: Gynecology;;   Family History  Problem Relation Age of Onset   Hyperlipidemia Mother    Anxiety disorder Mother    Atrial fibrillation Mother    Hypertension Mother    Memory loss Mother    Anxiety disorder Father    Stroke Father    Hyperlipidemia Father    Hypertension Father    Congestive Heart Failure Father    Depression Father    Hyperlipidemia Brother    Colon cancer Paternal Aunt    Dementia Paternal Aunt    Dementia Maternal Grandmother    Cancer Maternal Grandmother        unk primary; d. late 64s   Dementia Paternal Grandmother    ADD / ADHD Daughter    Breast cancer Neg Hx    Social History   Socioeconomic History   Marital status: Married    Spouse name: Gerald Stabs   Number of children: 3   Years of education: 13   Highest education level: Some college, no degree  Occupational History   Occupation: Disability  Tobacco Use   Smoking status: Never   Smokeless tobacco: Never  Vaping Use   Vaping Use: Never used  Substance and Sexual Activity   Alcohol use: Not Currently    Alcohol/week: 0.0 standard drinks of alcohol   Drug use: No   Sexual  activity: Yes    Birth control/protection: Surgical    Comment: Hysterectomy  Other Topics Concern   Not on file  Social History Narrative   Patient drinks 2-3 cups of caffeine daily.   Patient is right handed.       Social Hx:   Current living situation- [place]. Living with husband and 3 kids   Born and Raised- [place].raised by both parents.    Siblings- 1 brother who is 7 yrs older than her   Legal issues- none   Married for 24 yrs. Currently unemployed since 2011 when she hurt her back. HS grad and CNA and phlebotomy.    Social Determinants of Health   Financial Resource Strain: Low Risk  (02/18/2022)   Overall Financial Resource Strain (CARDIA)    Difficulty of Paying Living Expenses: Not hard  at all  Food Insecurity: No Food Insecurity (02/18/2022)   Hunger Vital Sign    Worried About Running Out of Food in the Last Year: Never true    Ran Out of Food in the Last Year: Never true  Transportation Needs: No Transportation Needs (02/18/2022)   PRAPARE - Hydrologist (Medical): No    Lack of Transportation (Non-Medical): No  Physical Activity: Insufficiently Active (02/18/2022)   Exercise Vital Sign    Days of Exercise per Week: 7 days    Minutes of Exercise per Session: 10 min  Stress: No Stress Concern Present (02/18/2022)   [place]    Feeling of Stress : Not at all  Social Connections: [place] (02/18/2022)   Social Connection and Isolation Panel [NHANES]    Frequency of Communication with Friends and Family: More than three times a week    Frequency of Social Gatherings with Friends and Family: More than three times a week    Attends Religious Services: More than 4 times per year    Active Member of [employer] or Organizations: Not on file    Attends Music therapist: More than 4 times per year    Marital Status: Married     Review of Systems  Constitutional:   Negative for appetite change and unexpected weight change.  HENT:  Negative for congestion and sinus pressure.   Respiratory:  Negative for cough, chest tightness and shortness of breath.   Cardiovascular:  Negative for chest pain and palpitations.  Gastrointestinal:  Negative for abdominal pain, diarrhea, nausea and vomiting.  Genitourinary:  Negative for difficulty urinating and dysuria.  Musculoskeletal:  Positive for back pain. Negative for myalgias.  Skin:  Negative for color change and rash.  Neurological:        Being followed by neurology for headaches and unsteadiness.   Psychiatric/Behavioral:  Negative for agitation and dysphoric mood.        Objective:     BP 118/72   Pulse 79   Temp 98.2 F (36.8 C)   Resp 16   Ht '5\' 10"'$  (1.778 m)   Wt 218 lb 6.4 oz (99.1 kg)   LMP 12/08/2006   SpO2 99%   BMI 31.34 kg/m  Wt Readings from Last 3 Encounters:  01/21/23 218 lb 6.4 oz (99.1 kg)  12/18/22 214 lb 8 oz (97.3 kg)  10/22/22 215 lb 6.4 oz (97.7 kg)    Physical Exam Vitals reviewed.  Constitutional:      General: She is not in acute distress.    Appearance: Normal appearance.  HENT:     Head: Normocephalic and atraumatic.     Right Ear: External ear normal.     Left Ear: External ear normal.  Eyes:     General: No scleral icterus.       Right eye: No discharge.        Left eye: No discharge.     Conjunctiva/sclera: Conjunctivae normal.  Neck:     Thyroid: No thyromegaly.  Cardiovascular:     Rate and Rhythm: Normal rate and regular rhythm.  Pulmonary:     Effort: No respiratory distress.     Breath sounds: Normal breath sounds. No wheezing.  Abdominal:     General: Bowel sounds are normal.     Palpations: Abdomen is soft.     Tenderness: There is no abdominal tenderness.  Musculoskeletal:  General: No swelling or tenderness.     Cervical back: Neck supple. No tenderness.  Lymphadenopathy:     Cervical: No cervical adenopathy.  Skin:    Findings:  No erythema or rash.  Neurological:     Mental Status: She is alert.  Psychiatric:        Mood and Affect: Mood normal.        Behavior: Behavior normal.      Outpatient Encounter Medications as of 01/21/2023  Medication Sig   acetaminophen (TYLENOL) 325 MG tablet Take 500 mg by mouth every 6 (six) hours as needed.   Acetylcysteine (NAC) 500 MG CAPS Take 500 mg by mouth at bedtime.   AIMOVIG 140 MG/ML SOAJ Inject 140 mg into the skin every 28 (twenty-eight) days.   atorvastatin (LIPITOR) 10 MG tablet Take 1 tablet (10 mg total) by mouth daily.   azelastine (ASTELIN) 0.1 % nasal spray Place 1 spray into both nostrils 2 (two) times daily. Use in each nostril as directed   clobetasol (TEMOVATE) 0.05 % external solution Apply topically daily as needed.   Crisaborole (EUCRISA) 2 % OINT Apply twice a day to hands prn flares   cyanocobalamin (VITAMIN B12) 1000 MCG/ML injection INJECT 1 ML INTRAMUSCULARLY  ONCE EVERY MONTH   diclofenac Sodium (VOLTAREN) 1 % GEL SMARTSIG:Gram(s) Topical 4 Times Daily PRN   esomeprazole (NEXIUM) 40 MG capsule Take 1 capsule by mouth once daily   imiquimod (ALDARA) 5 % cream Use 1/3 packet per application. Apply to affected area of vulva on Monday, Wednesday, Friday nights as directed. Wash off following morning with soap and water. Apply barrier cream to nontreatment areas.   lactase (RA DAIRY AID) 3000 units tablet Take 9,000 Units by mouth 3 (three) times daily as needed (with dairy consumption).    Magnesium Glycinate 100 MG CAPS Take 1 tablet by mouth daily.   methocarbamol (ROBAXIN) 500 MG tablet Take 500 mg by mouth 4 (four) times daily.   mupirocin ointment (BACTROBAN) 2 % Apply to healing wound TID.   nystatin cream (MYCOSTATIN) Apply topically 2 (two) times daily.   oxyCODONE-acetaminophen (PERCOCET) 10-325 MG tablet Take by mouth.   rizatriptan (MAXALT) 10 MG tablet Take 10 mg by mouth as needed for migraine. May repeat in 2 hours if needed   Ruxolitinib  Phosphate (OPZELURA) 1.5 % CREA Apply twice daily to hands and feet   sertraline (ZOLOFT) 100 MG tablet Take 2 tablets (200 mg total) by mouth daily.   Simethicone (GAS RELIEF PO) Take 125 mg by mouth 2 (two) times daily as needed for flatulence. Anti gas   tacrolimus (PROTOPIC) 0.1 % ointment APPLY TOPICALLY TWO (2) (TWO) TIMES DAILY.   topiramate (TOPAMAX) 25 MG capsule Take 25 mg by mouth 2 (two) times daily.   Ubrogepant (UBRELVY) 100 MG TABS Take 100 mg by mouth 2 (two) times daily as needed.   [DISCONTINUED] Semaglutide, 2 MG/DOSE, (OZEMPIC, 2 MG/DOSE,) 8 MG/3ML SOPN INJECT 2 MG SUBCUTANEOUSLY AS DIRECTED ONCE WEEKLY   No facility-administered encounter medications on file as of 01/21/2023.     Lab Results  Component Value Date   WBC 5.6 07/19/2022   HGB 13.3 07/19/2022   HCT 38.9 07/19/2022   PLT 185.0 07/19/2022   GLUCOSE 74 10/22/2022   CHOL 255 (H) 10/22/2022   TRIG 168.0 (H) 10/22/2022   HDL 49.60 10/22/2022   LDLDIRECT 181.0 06/26/2020   LDLCALC 172 (H) 10/22/2022   ALT 33 10/22/2022   AST 28 10/22/2022  NA 138 10/22/2022   K 4.5 10/22/2022   CL 101 10/22/2022   CREATININE 0.75 10/22/2022   BUN 13 10/22/2022   CO2 32 10/22/2022   TSH 1.59 07/17/2021   INR 1.1 12/03/2019   HGBA1C 5.3 10/22/2022    MR LUMBAR SPINE WO CONTRAST  Result Date: 08/27/2022 CLINICAL DATA:  Low back pain bilateral leg pain. EXAM: MRI LUMBAR SPINE WITHOUT CONTRAST TECHNIQUE: Multiplanar, multisequence MR imaging of the lumbar spine was performed. No intravenous contrast was administered. COMPARISON:  Lumbar MRI 08/31/2010 FINDINGS: Segmentation:  5 lumbar vertebra.  Lowest disc space L5-S1 Alignment:  Mild retrolisthesis L2-3 and L3-4 Vertebrae:  Negative for fracture or mass.  Normal bone marrow. Conus medullaris and cauda equina: Conus extends to the L1-2 level. Conus and cauda equina appear normal. Paraspinal and other soft tissues: Negative for paraspinous mass or adenopathy Disc levels:  L1-2: Mild facet degeneration. Negative for disc protrusion or stenosis L2-3: Disc degeneration with disc bulging and bilateral facet degeneration. No significant spinal stenosis L3-4: Mild disc degeneration with diffuse disc bulging and bilateral facet degeneration. Mild subarticular stenosis bilaterally. Central canal patent L4-5: Disc degeneration with diffuse disc bulging and shallow central disc protrusion. Bilateral facet degeneration. Mild subarticular stenosis bilaterally. Central canal patent L5-S1: Negative IMPRESSION: Mild lumbar degenerative change. Mild subarticular stenosis bilaterally L3-4 and L4-5. Progressive disc degeneration at L2-3, L3-4, L4-5 since 2011 Electronically Signed   By: Franchot Gallo M.D.   On: 08/27/2022 21:02       Assessment & Plan:  Hypercholesteremia Assessment & Plan: On lipitor.  Low cholesterol diet and exercise.  Follow lipid panel and liver function tests.    Orders: -     Hepatic function panel; Future -     Lipid panel; Future  Hyperglycemia Assessment & Plan: Low carb diet and exercise.  Follow met b and a1c.   Orders: -     Basic metabolic panel; Future -     Hemoglobin A1c; Future  BMI 35.0-35.9,adult -     Amb Ref to [facility] Weight Management  Anemia, unspecified type Assessment & Plan: Follow cbc.    Arnold-Chiari malformation [facility]) Assessment & Plan: S/p surgery.  Followed by neurology.    Chronic low back pain, unspecified back pain laterality, unspecified whether sciatica present Assessment & Plan: Followed by pain clinic.    Depression, major, in remission [facility]) Assessment & Plan: Seeing psychiatry.  On zoloft.  Follow.  Appears to be relatively stable.    Generalized anxiety disorder Assessment & Plan: Seeing psychiatry.  On zoloft. Appears to be stable.    Gastroesophageal reflux disease, unspecified whether esophagitis present Assessment & Plan: Continue nexium.    Other migraine without status migrainosus,  not intractable Assessment & Plan: Seeing neurology for migraines - ubrelvy, aimovig and rizatriptan.   Last evaluated 11/25/22.  Taking topamax bid.   Vulvar dysplasia Assessment & Plan: 12/17/22 - Dr Theora Gianotti - physical exam including pelvic exam, colposcopy, and biopsy.  aldara.    Weight loss counseling, encounter for Assessment & Plan: Has done well on ozempic.  Does not have diagnosis of diabetes.  Ozempic stopped.  Discussed diet and exercise.  On topamax for headaches.  Refer to [facility].  Follow.       [name], MD

## 2023-01-26 ENCOUNTER — Encounter: Payer: Self-pay | Admitting: Internal Medicine

## 2023-01-26 DIAGNOSIS — Z713 Dietary counseling and surveillance: Secondary | ICD-10-CM | POA: Insufficient documentation

## 2023-01-26 NOTE — Assessment & Plan Note (Signed)
Low carb diet and exercise.  Follow met b and a1c.   

## 2023-01-26 NOTE — Assessment & Plan Note (Signed)
Continue nexium 

## 2023-01-26 NOTE — Assessment & Plan Note (Signed)
On lipitor.  Low cholesterol diet and exercise.  Follow lipid panel and liver function tests.   

## 2023-01-26 NOTE — Assessment & Plan Note (Signed)
Has done well on ozempic.  Does not have diagnosis of diabetes.  Ozempic stopped.  Discussed diet and exercise.  On topamax for headaches.  [facility]er to [facility].  Follow.

## 2023-01-26 NOTE — Assessment & Plan Note (Signed)
12/17/22 - Dr Theora Gianotti - physical exam including pelvic exam, colposcopy, and biopsy.  aldara.

## 2023-01-26 NOTE — Assessment & Plan Note (Signed)
Seeing psychiatry.  On zoloft.  Follow.  Appears to be relatively stable.

## 2023-01-26 NOTE — Assessment & Plan Note (Signed)
Seeing neurology for migraines - ubrelvy, aimovig and rizatriptan.   Last evaluated 11/25/22.  Taking topamax bid.

## 2023-01-26 NOTE — Assessment & Plan Note (Signed)
Seeing psychiatry.  On zoloft. Appears to be stable.  

## 2023-01-26 NOTE — Assessment & Plan Note (Signed)
S/p surgery.  Followed by neurology.  ?

## 2023-01-26 NOTE — Assessment & Plan Note (Signed)
Followed by pain clinic.  

## 2023-01-26 NOTE — Assessment & Plan Note (Signed)
Follow cbc.  

## 2023-01-27 NOTE — Telephone Encounter (Signed)
We discussed her migraines, etc (sees neurology for headaches) - but this sounds like a change compared to when she was here.  If increased pain and problems, needs to be evaluated to determine what further testing is warranted.

## 2023-01-27 NOTE — Telephone Encounter (Signed)
Pt called in to book an appt. No available openings soon with Dr. Nicki Reaper, however as per pt, she's open to see another provider, I booked her with Wynetta Emery 3/13 '@8am'$ .

## 2023-01-28 DIAGNOSIS — M47816 Spondylosis without myelopathy or radiculopathy, lumbar region: Secondary | ICD-10-CM | POA: Diagnosis not present

## 2023-01-28 DIAGNOSIS — M25551 Pain in right hip: Secondary | ICD-10-CM | POA: Diagnosis not present

## 2023-01-28 DIAGNOSIS — M160 Bilateral primary osteoarthritis of hip: Secondary | ICD-10-CM | POA: Diagnosis not present

## 2023-01-28 DIAGNOSIS — M5137 Other intervertebral disc degeneration, lumbosacral region: Secondary | ICD-10-CM | POA: Diagnosis not present

## 2023-01-28 DIAGNOSIS — M5442 Lumbago with sciatica, left side: Secondary | ICD-10-CM | POA: Diagnosis not present

## 2023-01-28 DIAGNOSIS — M5412 Radiculopathy, cervical region: Secondary | ICD-10-CM | POA: Diagnosis not present

## 2023-01-28 DIAGNOSIS — G2581 Restless legs syndrome: Secondary | ICD-10-CM | POA: Diagnosis not present

## 2023-01-28 DIAGNOSIS — M5441 Lumbago with sciatica, right side: Secondary | ICD-10-CM | POA: Diagnosis not present

## 2023-01-28 DIAGNOSIS — M1611 Unilateral primary osteoarthritis, right hip: Secondary | ICD-10-CM | POA: Diagnosis not present

## 2023-01-28 NOTE — Progress Notes (Unsigned)
Tomasita Morrow, NP-C Phone: [phone]  Kathleen Everett is a 52 y.o. female who presents today for TMJ and migraines.  Patient sees Neurology for migraines. Last visit on 11/25/2022. Currently on Ubrelvy, Rizatriptan, Aimovig injections and Topamax. Patient has a Hx of TMJ and believes worsening symptoms are causing her migraines. She has an appointment with an oral surgeon in June for further evaluation of her TMJ. She has had injections on her right side in the past. She reports worsening pain on her left side for the last several weeks. Reports pain with opening her mouth and chewing. Reports intermittent popping of her jaw. She is able to talk without difficulty but is unable to completely open her jaw. Reports the pain radiates into her left ear. Denies any pain in her teeth.   Social History   Tobacco Use  Smoking Status Never  Smokeless Tobacco Never    Current Outpatient Medications on File Prior to Visit  Medication Sig Dispense Refill   acetaminophen (TYLENOL) 325 MG tablet Take 500 mg by mouth every 6 (six) hours as needed.     Acetylcysteine (NAC) 500 MG CAPS Take 500 mg by mouth at bedtime. 30 capsule 0   AIMOVIG 140 MG/ML SOAJ Inject 140 mg into the skin every 28 (twenty-eight) days.     atorvastatin (LIPITOR) 10 MG tablet Take 1 tablet (10 mg total) by mouth daily. 90 tablet 1   azelastine (ASTELIN) 0.1 % nasal spray Place 1 spray into both nostrils 2 (two) times daily. Use in each nostril as directed 30 mL 1   clobetasol (TEMOVATE) 0.05 % external solution Apply topically daily as needed.     Crisaborole (EUCRISA) 2 % OINT Apply twice a day to hands prn flares 60 g 2   cyanocobalamin (VITAMIN B12) 1000 MCG/ML injection INJECT 1 ML INTRAMUSCULARLY  ONCE EVERY MONTH 10 mL 0   diclofenac Sodium (VOLTAREN) 1 % GEL SMARTSIG:Gram(s) Topical 4 Times Daily PRN     esomeprazole (NEXIUM) 40 MG capsule Take 1 capsule by mouth once daily 90 capsule 3   imiquimod (ALDARA) 5 %  cream Use 1/3 packet per application. Apply to affected area of vulva on Monday, Wednesday, Friday nights as directed. Wash off following morning with soap and water. Apply barrier cream to nontreatment areas. 16 each 0   lactase (RA DAIRY AID) 3000 units tablet Take 9,000 Units by mouth 3 (three) times daily as needed (with dairy consumption).      Magnesium Glycinate 100 MG CAPS Take 1 tablet by mouth daily. 30 capsule 0   methocarbamol (ROBAXIN) 500 MG tablet Take 500 mg by mouth 4 (four) times daily.     mupirocin ointment (BACTROBAN) 2 % Apply to healing wound TID. 22 g 1   nystatin cream (MYCOSTATIN) Apply topically 2 (two) times daily. 30 g 0   oxyCODONE-acetaminophen (PERCOCET) 10-325 MG tablet Take by mouth.     rizatriptan (MAXALT) 10 MG tablet Take 10 mg by mouth as needed for migraine. May repeat in 2 hours if needed     Ruxolitinib Phosphate (OPZELURA) 1.5 % CREA Apply twice daily to hands and feet 60 g 3   sertraline (ZOLOFT) 100 MG tablet Take 2 tablets (200 mg total) by mouth daily. 180 tablet 0   Simethicone (GAS RELIEF PO) Take 125 mg by mouth 2 (two) times daily as needed for flatulence. Anti gas     tacrolimus (PROTOPIC) 0.1 % ointment APPLY TOPICALLY TWO (2) (TWO) TIMES DAILY. 100 g 2  topiramate (TOPAMAX) 25 MG capsule Take 25 mg by mouth 2 (two) times daily.     Ubrogepant (UBRELVY) 100 MG TABS Take 100 mg by mouth 2 (two) times daily as needed.     No current facility-administered medications on file prior to visit.    ROS see history of present illness  Objective  Physical Exam Vitals:   01/29/23 0810  BP: 124/82  Pulse: 65  Temp: 98.2 F (36.8 C)  SpO2: 99%    BP Readings from Last 3 Encounters:  01/29/23 124/82  01/21/23 118/72  12/18/22 127/74   Wt Readings from Last 3 Encounters:  01/29/23 225 lb 12.8 oz (102.4 kg)  01/21/23 218 lb 6.4 oz (99.1 kg)  12/18/22 214 lb 8 oz (97.3 kg)    Physical Exam Constitutional:      General: She is not in  acute distress.    Appearance: Normal appearance.  HENT:     Head: Normocephalic.     Jaw: Tenderness and pain on movement present.     Right Ear: Tympanic membrane normal.     Left Ear: Tympanic membrane normal.     Nose: Nose normal.     Mouth/Throat:     Mouth: Mucous membranes are moist.     Pharynx: Oropharynx is clear.  Eyes:     Conjunctiva/sclera: Conjunctivae normal.     Pupils: Pupils are equal, round, and reactive to light.  Cardiovascular:     Rate and Rhythm: Normal rate and regular rhythm.     Heart sounds: Normal heart sounds.  Pulmonary:     Effort: Pulmonary effort is normal.     Breath sounds: Normal breath sounds.  Lymphadenopathy:     Cervical: No cervical adenopathy.  Skin:    General: Skin is warm and dry.  Neurological:     General: No focal deficit present.     Mental Status: She is alert.  Psychiatric:        Mood and Affect: Mood normal.        Behavior: Behavior normal.    Assessment/Plan: Please see individual problem list.  Arthralgia of left temporomandibular joint Assessment & Plan: Will try Prednisone taper to see if patient gets any relief in symptoms. Advised patient to look into mouth guard to wear at night. Discussed joint rest, eating soft easy to chew foods, ice/heat, Tylenol, and decreasing stressors. Encouraged follow up with oral surgeon in June.   Orders: -     predniSONE; TAKE 3 TABLETS PO QD FOR 3 DAYS THEN TAKE 2 TABLETS PO QD FOR 3 DAYS THEN TAKE 1 TABLET PO QD FOR 3 DAYS THEN TAKE 1/2 TAB PO QD FOR 3 DAYS  Dispense: 20 tablet; Refill: 0   Return if symptoms worsen or fail to improve.   Tomasita Morrow, NP-C [place]

## 2023-01-29 ENCOUNTER — Encounter: Payer: Self-pay | Admitting: Nurse Practitioner

## 2023-01-29 ENCOUNTER — Ambulatory Visit (INDEPENDENT_AMBULATORY_CARE_PROVIDER_SITE_OTHER): Payer: Medicare HMO | Admitting: Nurse Practitioner

## 2023-01-29 VITALS — BP 124/82 | HR 65 | Temp 98.2°F | Ht 70.0 in | Wt 225.8 lb

## 2023-01-29 DIAGNOSIS — M26622 Arthralgia of left temporomandibular joint: Secondary | ICD-10-CM

## 2023-01-29 MED ORDER — PREDNISONE 10 MG PO TABS: ORAL_TABLET | ORAL | 0 refills | Status: DC

## 2023-01-29 NOTE — Assessment & Plan Note (Signed)
Will try Prednisone taper to see if patient gets any relief in symptoms. Advised patient to look into mouth guard to wear at night. Discussed joint rest, eating soft easy to chew foods, ice/heat, Tylenol, and decreasing stressors. Encouraged follow up with oral surgeon in June.

## 2023-01-30 ENCOUNTER — Telehealth (HOSPITAL_BASED_OUTPATIENT_CLINIC_OR_DEPARTMENT_OTHER): Payer: Medicare HMO | Admitting: Psychiatry

## 2023-01-30 DIAGNOSIS — F5105 Insomnia due to other mental disorder: Secondary | ICD-10-CM

## 2023-01-30 DIAGNOSIS — F411 Generalized anxiety disorder: Secondary | ICD-10-CM

## 2023-01-30 DIAGNOSIS — F331 Major depressive disorder, recurrent, moderate: Secondary | ICD-10-CM

## 2023-01-30 MED ORDER — SERTRALINE HCL 100 MG PO TABS: 200.0000 mg | ORAL_TABLET | Freq: Every day | ORAL | 0 refills | Status: DC

## 2023-01-30 MED ORDER — METHYLPHENIDATE HCL 5 MG PO TABS: 5.0000 mg | ORAL_TABLET | Freq: Every day | ORAL | 0 refills | Status: DC

## 2023-01-30 NOTE — Progress Notes (Signed)
Virtual Visit via Video Note  I connected with Kathleen Everett on 01/30/23 at  1:30 PM EDT by  a video enabled telemedicine application and verified that I am speaking with the correct person using two identifiers.  Location: Patient: home Provider: office   I discussed the limitations of evaluation and management by telemedicine and the availability of in person appointments. The patient expressed understanding and agreed to proceed.  History of Present Illness: "Things are semi- okay". Kathleen Everett never started NAC due to concerns about potential side effects.  The last couple of days have been very stressful. 2 of their cars are not working. She is stressed by her health and finances. She went to see her NP yesterday due to jaw pain, ear pain and migraines. She is able to talk but eating is hard. She was given a steroid. She feels anxious every other day.  Kathleen Everett gets aggravated and irritated when she doesn't feel heard. It is hard to get her words out at times. Kathleen Everett had her hearing testing last year and was told she could benefit from a hearing aid but insurance won't cover it. Her sleep is poor most nights  and she is waking up very early in the night. Her energy is low due to poor sleep. Her depression is ongoing and a daily struggle. The level of depression is 6/10 (10 being the worst). She has low motivation and anhedonia. She wants to be able to do stuff but can't. She gets excited about the idea of doing things but loses all desire to go when the time comes. Her appetite is decreased and she has gained 12 lbs now that she is off Ozempic. She is eating just to eat most days. Kathleen Everett has been doing online shopping and has maxed out her credit cards. She denies SI/HI and denies passive thoughts of death. She is now off the Zyprexa and has not noticed any change in mood.    Observations/Objective: Psychiatric Specialty Exam: ROS  Last menstrual period 12/08/2006.There is no height  or weight on file to calculate BMI.  General Appearance: Casual and Fairly Groomed  Eye Contact:  Good  Speech:  Clear and Coherent and Normal Rate  Volume:  Normal  Mood:  Anxious and Depressed  Affect:  Congruent  Thought Process:  Goal Directed, Linear, and Descriptions of Associations: Intact  Orientation:  Full (Time, Place, and Person)  Thought Content:  Logical  Suicidal Thoughts:  No  Homicidal Thoughts:  No  Memory:  Immediate;   Good  Judgement:  Good  Insight:  Good  Psychomotor Activity:  Normal  Concentration:  Concentration: Good  Recall:  Good  Fund of Knowledge:  Good  Language:  Good  Akathisia:  No  Handed:  Right  AIMS (if indicated):     Assets:  Communication Skills Desire for Improvement Financial Resources/Insurance Housing Leisure Time Resilience Social Support Talents/Skills Transportation Vocational/Educational  ADL's:  Intact  Cognition:  WNL  Sleep:        Assessment and Plan:     01/30/2023   11:27 AM 01/29/2023    8:11 AM 12/05/2022    3:44 PM 10/22/2022    7:37 AM 10/03/2022    9:02 AM  Depression screen PHQ 2/9  Decreased Interest '3 3 3 2 3  '$ Down, Depressed, Hopeless '3 3 3 2 3  '$ PHQ - 2 Score '6 6 6 4 6  '$ Altered sleeping '2 2 3 1 3  '$ Tired, decreased  energy '3 3 3 1 3  '$ Change in appetite 3 2 0 0 0  Feeling bad or failure about yourself  '3 3 3 '$ 0 1  Trouble concentrating '3 3 3 '$ 0 3  Moving slowly or fidgety/restless 1 1 0 0 0  Suicidal thoughts 0 0 0 0 0  PHQ-9 Score '21 20 18 6 16  '$ Difficult doing work/chores Extremely dIfficult Extremely dIfficult Very difficult Somewhat difficult Extremely dIfficult    Flowsheet Row Video Visit from 01/30/2023 in [facility] Video Visit from 12/05/2022 in [facility] Video Visit from 10/03/2022 in [facility] No Risk No Risk No Risk          Pt is aware  that these meds carry a teratogenic risk. Pt will discuss plan of action if she does or plans to become pregnant in the future.  Status of current problems: ongoing depression and anxiety symptoms.    Medication management with supportive therapy. Risks and benefits, side effects and alternative treatment options discussed with patient. Pt was given an opportunity to ask questions about medication, illness, and treatment. All current psychiatric medications have been reviewed and discussed with the patient and adjusted as clinically appropriate.  Pt verbalized understanding and verbal consent obtained for treatment.  Meds: Start trial of Ritalin '5mg'$  po qD- she wants to try it again and see if GI SE are tolerable with low dose 1. MDD (major depressive disorder), recurrent episode, moderate (HCC) - sertraline (ZOLOFT) 100 MG tablet; Take 2 tablets (200 mg total) by mouth daily.  Dispense: 180 tablet; Refill: 0  2. Generalized anxiety disorder - sertraline (ZOLOFT) 100 MG tablet; Take 2 tablets (200 mg total) by mouth daily.  Dispense: 180 tablet; Refill: 0  3. Insomnia due to other mental disorder     Labs: none    Therapy: brief supportive therapy provided. Discussed psychosocial stressors in detail.     Collaboration of Care: Other none  Patient/Guardian was advised Release of Information must be obtained prior to any record release in order to collaborate their care with an outside provider. Patient/Guardian was advised if they have not already done so to contact the registration department to sign all necessary forms in order for [place] to release information regarding their care.   Consent: Patient/Guardian gives verbal consent for treatment and assignment of benefits for services provided during this visit. Patient/Guardian expressed understanding and agreed to proceed.     Follow Up Instructions: Follow up in 2-4 weeks or sooner if needed    I discussed the assessment and  treatment plan with the patient. The patient was provided an opportunity to ask questions and all were answered. The patient agreed with the plan and demonstrated an understanding of the instructions.   The patient was advised to call back or seek an in-person evaluation if the symptoms worsen or if the condition fails to improve as anticipated.  I provided 23 minutes of non-face-to-face time during this encounter.   Charlcie Cradle, MD

## 2023-02-27 ENCOUNTER — Telehealth (HOSPITAL_COMMUNITY): Payer: Medicare PPO | Admitting: Psychiatry

## 2023-02-27 DIAGNOSIS — F99 Mental disorder, not otherwise specified: Secondary | ICD-10-CM

## 2023-02-27 DIAGNOSIS — F411 Generalized anxiety disorder: Secondary | ICD-10-CM

## 2023-02-27 DIAGNOSIS — F5105 Insomnia due to other mental disorder: Secondary | ICD-10-CM | POA: Diagnosis not present

## 2023-02-27 DIAGNOSIS — F331 Major depressive disorder, recurrent, moderate: Secondary | ICD-10-CM | POA: Diagnosis not present

## 2023-02-27 MED ORDER — METHYLPHENIDATE HCL 10 MG PO TABS: 10.0000 mg | ORAL_TABLET | Freq: Every day | ORAL | 0 refills | Status: DC

## 2023-02-27 NOTE — Progress Notes (Signed)
Virtual Visit via Video Note  I connected with Kathleen Everett on 02/27/23 at 10:45 AM EDT by a video enabled telemedicine application and verified that I am speaking with the correct person using two identifiers.  Location: Patient: home Provider: office   I discussed the limitations of evaluation and management by telemedicine and the availability of in person appointments. The patient expressed understanding and agreed to proceed.  History of Present Illness: Kathleen Everett shares she is tired today. She was on vacation last week. She slept better then. She slept for longer times. She has come to realize they need a new mattress. Now at home she wakes up every 2-3 hrs. Her pain is a little worse and thinks it due to the mattress. Kathleen Everett is taking the Ritalin and feels it is helpful. She is focusing a little more. Her energy is low and hasn't changed much with Ritalin. She denies any GI side effects with Ritalin like last time. Some days she has a little more motivation to do some things around the house. Her anxiety remains high and is mostly due to finances and bills. She is still easily irritated by people. Her depression is ongoing but a little better. It comes and goes thru out the week. She had sad days and her average level of depression is 4.5/10 (10 being the worst). She is depressed about 4-5 days a week. Kathleen Everett will reach out to a close friend when she is having a bad day. She denies SI/HI.    Observations/Objective: Psychiatric Specialty Exam: ROS  Last menstrual period 12/08/2006.There is no height or weight on file to calculate BMI.  General Appearance: Casual  Eye Contact:  Good  Speech:  Clear and Coherent and Normal Rate  Volume:  Normal  Mood:  Anxious and Depressed  Affect:  Congruent  Thought Process:  Goal Directed, Linear, and Descriptions of Associations: Intact  Orientation:  Full (Time, Place, and Person)  Thought Content:  Logical  Suicidal Thoughts:  No   Homicidal Thoughts:  No  Memory:  Immediate;   Good  Judgement:  Good  Insight:  Good  Psychomotor Activity:  Normal  Concentration:  Concentration: Good  Recall:  Good  Fund of Knowledge:  Good  Language:  Good  Akathisia:  No  Handed:  Right  AIMS (if indicated):     Assets:  Communication Skills Desire for Improvement Financial Resources/Insurance Housing Intimacy Resilience Social Support Talents/Skills Transportation Vocational/Educational  ADL's:  Intact  Cognition:  WNL  Sleep:        Assessment and Plan:     01/30/2023   11:27 AM 01/29/2023    8:11 AM 12/05/2022    3:44 PM 10/22/2022    7:37 AM 10/03/2022    9:02 AM  Depression screen PHQ 2/9  Decreased Interest 3 3 3 2 3   Down, Depressed, Hopeless 3 3 3 2 3   PHQ - 2 Score 6 6 6 4 6   Altered sleeping 2 2 3 1 3   Tired, decreased energy 3 3 3 1 3   Change in appetite 3 2 0 0 0  Feeling bad or failure about yourself  3 3 3  0 1  Trouble concentrating 3 3 3  0 3  Moving slowly or fidgety/restless 1 1 0 0 0  Suicidal thoughts 0 0 0 0 0  PHQ-9 Score 21 20 18 6 16   Difficult doing work/chores Extremely dIfficult Extremely dIfficult Very difficult Somewhat difficult Extremely dIfficult    Flowsheet Row  Video Visit from 01/30/2023 in [facility] Video Visit from 12/05/2022 in [facility] Video Visit from 10/03/2022 in [facility]  C-SSRS RISK CATEGORY No Risk No Risk No Risk          Pt is aware that these meds carry a teratogenic risk. Pt will discuss plan of action if she does or plans to become pregnant in the future.  Status of current problems: mild improvement in depression, ongoing anxiety and insomnia   Medication management with supportive therapy. Risks and benefits, side effects and alternative treatment options discussed with patient. Pt was given an opportunity to ask questions  about medication, illness, and treatment. All current psychiatric medications have been reviewed and discussed with the patient and adjusted as clinically appropriate.  Pt verbalized understanding and verbal consent obtained for treatment.  Meds: increase Ritalin 10mg  po qD - good response and denies SE.  Continue Zoloft 200mg  po qD 1. MDD (major depressive disorder), recurrent episode, moderate - methylphenidate (RITALIN) 10 MG tablet; Take 1 tablet (10 mg total) by mouth daily.  Dispense: 30 tablet; Refill: 0 - methylphenidate (RITALIN) 10 MG tablet; Take 1 tablet (10 mg total) by mouth daily.  Dispense: 30 tablet; Refill: 0  2. Generalized anxiety disorder  3. Insomnia due to other mental disorder     Labs: none    Therapy: brief supportive therapy provided. Discussed psychosocial stressors in detail.    Collaboration of Care: Other none  Patient/Guardian was advised Release of Information must be obtained prior to any record release in order to collaborate their care with an outside provider. Patient/Guardian was advised if they have not already done so to contact the registration department to sign all necessary forms in order for [place] to release information regarding their care.   Consent: Patient/Guardian gives verbal consent for treatment and assignment of benefits for services provided during this visit. Patient/Guardian expressed understanding and agreed to proceed.      Follow Up Instructions: Follow up in  17months or sooner if needed    I discussed the assessment and treatment plan with the patient. The patient was provided an opportunity to ask questions and all were answered. The patient agreed with the plan and demonstrated an understanding of the instructions.   The patient was advised to call back or seek an in-person evaluation if the symptoms worsen or if the condition fails to improve as anticipated.  I provided 14 minutes of non-face-to-face time during this  encounter.   Oletta Darter, MD

## 2023-03-10 ENCOUNTER — Telehealth: Payer: Self-pay | Admitting: Internal Medicine

## 2023-03-10 NOTE — Telephone Encounter (Signed)
Contacted Kathleen Everett to schedule their annual wellness visit. Appointment made for 03/14/2023.  Thank you,  [facility] Direct dial  [phone]

## 2023-03-14 ENCOUNTER — Ambulatory Visit (INDEPENDENT_AMBULATORY_CARE_PROVIDER_SITE_OTHER): Payer: Medicare PPO

## 2023-03-14 VITALS — Ht 70.0 in | Wt 236.0 lb

## 2023-03-14 DIAGNOSIS — Z0000 Encounter for general adult medical examination without abnormal findings: Secondary | ICD-10-CM | POA: Diagnosis not present

## 2023-03-14 NOTE — Progress Notes (Signed)
Subjective:   Kathleen Everett is a 52 y.o. female who presents for Medicare Annual (Subsequent) preventive examination.  Review of Systems    No ROS.  Medicare Wellness Virtual Visit.  Visual/audio telehealth visit, UTA vital signs.   See social history for additional risk factors.   Cardiac Risk Factors include: advanced age (>71men, >43 women)     Objective:    Today's Vitals   03/14/23 1305  Weight: 236 lb (107 kg)  Height: 5\' 10"  (1.778 m)   Body mass index is 33.86 kg/m.     03/14/2023    1:18 PM 12/18/2022   10:50 AM 08/09/2022    9:33 AM 06/12/2022    3:09 PM 05/08/2022    9:34 AM 04/17/2022   10:53 AM 03/20/2022    8:37 AM  Advanced Directives  Does Patient Have a Medical Advance Directive? No No No No No No No  Would patient like information on creating a medical advance directive? No - Patient declined No - Patient declined Yes (MAU/Ambulatory/Procedural Areas - Information given) No - Patient declined No - Patient declined No - Patient declined No - Patient declined    Current Medications (verified) Outpatient Encounter Medications as of 03/14/2023  Medication Sig   acetaminophen (TYLENOL) 325 MG tablet Take 500 mg by mouth every 6 (six) hours as needed.   AIMOVIG 140 MG/ML SOAJ Inject 140 mg into the skin every 28 (twenty-eight) days.   atorvastatin (LIPITOR) 10 MG tablet Take 1 tablet (10 mg total) by mouth daily.   azelastine (ASTELIN) 0.1 % nasal spray Place 1 spray into both nostrils 2 (two) times daily. Use in each nostril as directed   clobetasol (TEMOVATE) 0.05 % external solution Apply topically daily as needed.   Crisaborole (EUCRISA) 2 % OINT Apply twice a day to hands prn flares   cyanocobalamin (VITAMIN B12) 1000 MCG/ML injection INJECT 1 ML INTRAMUSCULARLY  ONCE EVERY MONTH   diclofenac Sodium (VOLTAREN) 1 % GEL SMARTSIG:Gram(s) Topical 4 Times Daily PRN   esomeprazole (NEXIUM) 40 MG capsule Take 1 capsule by mouth once daily   imiquimod  (ALDARA) 5 % cream Use 1/3 packet per application. Apply to affected area of vulva on Monday, Wednesday, Friday nights as directed. Wash off following morning with soap and water. Apply barrier cream to nontreatment areas.   lactase (RA DAIRY AID) 3000 units tablet Take 9,000 Units by mouth 3 (three) times daily as needed (with dairy consumption).    Magnesium Glycinate 100 MG CAPS Take 1 tablet by mouth daily.   methocarbamol (ROBAXIN) 500 MG tablet Take 500 mg by mouth 4 (four) times daily.   methylphenidate (RITALIN) 10 MG tablet Take 1 tablet (10 mg total) by mouth daily.   methylphenidate (RITALIN) 10 MG tablet Take 1 tablet (10 mg total) by mouth daily.   mupirocin ointment (BACTROBAN) 2 % Apply to healing wound TID.   nystatin cream (MYCOSTATIN) Apply topically 2 (two) times daily.   oxyCODONE-acetaminophen (PERCOCET) 10-325 MG tablet Take by mouth.   predniSONE (DELTASONE) 10 MG tablet TAKE 3 TABLETS PO QD FOR 3 DAYS THEN TAKE 2 TABLETS PO QD FOR 3 DAYS THEN TAKE 1 TABLET PO QD FOR 3 DAYS THEN TAKE 1/2 TAB PO QD FOR 3 DAYS   rizatriptan (MAXALT) 10 MG tablet Take 10 mg by mouth as needed for migraine. May repeat in 2 hours if needed   Ruxolitinib Phosphate (OPZELURA) 1.5 % CREA Apply twice daily to hands and feet   sertraline (  ZOLOFT) 100 MG tablet Take 2 tablets (200 mg total) by mouth daily.   Simethicone (GAS RELIEF PO) Take 125 mg by mouth 2 (two) times daily as needed for flatulence. Anti gas   tacrolimus (PROTOPIC) 0.1 % ointment APPLY TOPICALLY TWO (2) (TWO) TIMES DAILY.   topiramate (TOPAMAX) 25 MG capsule Take 25 mg by mouth 2 (two) times daily.   Ubrogepant (UBRELVY) 100 MG TABS Take 100 mg by mouth 2 (two) times daily as needed.   No facility-administered encounter medications on file as of 03/14/2023.    Allergies (verified) Vancomycin, Ceftin [cefuroxime axetil], Celexa [citalopram hydrobromide], Clindamycin/lincomycin, Penicillins, and Sulfa antibiotics   History: Past  Medical History:  Diagnosis Date   Abdominal pain 07/24/2014   Abscess of right hip 05/11/2020   Anemia 01/02/2020   Ankle pain, right 04/24/2014   Breast pain, right 09/08/2014   Chest pain 02/15/2015   Chiari malformation type I 01/26/2014   s/p sgy decompression   Chronic back pain 12/13/2012   Chronic gastritis 07/18/2015   Chronic tension-type headache, intractable 04/24/2015   DDD (degenerative disc disease), lumbar    Diverticulitis    Dysmenorrhea    Dysplastic nevus 10/09/2015   R abdomen - moderate, limited margins free.   Endometriosis    Generalized anxiety disorder 05/01/2015   Has previously seen psychiatry. Having some issues with what she feels is related to stress and anxiety   GERD (gastroesophageal reflux disease)    Hemorrhoids 12/23/2020   Hidradenitis    History of ovarian cyst    Hot flashes 03/12/2015   Hypercholesteremia 01/10/2014   Off lipitor. Elevated cholesterol. Labs discussed. Restart lipitor. Follow lipid panel and liver function tests.   Hyperglycemia 06/24/2019   Major depressive disorder 05/01/2015   Migraine headaches 01/10/2014   Has seen neurology. Topamax.   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 Level is 21.6   Past Surgical History:  Procedure Laterality Date   ABDOMINAL HYSTERECTOMY  03/27/07   laparoscopic   BREAST EXCISIONAL BIOPSY Left 2004   fibroadenoma   CESAREAN SECTION     1999/2004 twins   chiari decompression     COLONOSCOPY WITH PROPOFOL N/A 04/28/2015   Procedure:  COLONOSCOPY WITH PROPOFOL;  Surgeon: Scot Jun, MD;  Location: [facility] ENDOSCOPY;  Service: Endoscopy;  Laterality: N/A;   COLONOSCOPY WITH PROPOFOL N/A 03/19/2021   Procedure: COLONOSCOPY WITH PROPOFOL;  Surgeon: Regis Bill, MD;  Location: [facility] ENDOSCOPY;  Service: Endoscopy;  Laterality: N/A;   COLPOSCOPY N/A 04/24/2022   Procedure: COLPOSCOPY, ABLATION OF VULVAR TISSUE;  Surgeon: Artelia Laroche, MD;  Location: [facility] ORS;  Service: Gynecology;  Laterality: N/A;   CYSTOSCOPY     bladder ulcers   ESOPHAGOGASTRODUODENOSCOPY N/A 04/28/2015   Procedure: ESOPHAGOGASTRODUODENOSCOPY (EGD);  Surgeon: Scot Jun, MD;  Location: [facility] ENDOSCOPY;  Service: Endoscopy;  Laterality: N/A;   ESOPHAGOGASTRODUODENOSCOPY (EGD) WITH PROPOFOL N/A 03/19/2021   Procedure: ESOPHAGOGASTRODUODENOSCOPY (EGD) WITH PROPOFOL;  Surgeon: Eather Colas  T, MD;  Location: [facility] ENDOSCOPY;  Service: Endoscopy;  Laterality: N/A;   HERNIA REPAIR Right 2003   inguinal   INCISION AND DRAINAGE HIP Right 05/11/2020   Procedure: IRRIGATION AND DEBRIDEMENT HIP;  Surgeon: Kennedy Bucker, MD;  Location: [facility] ORS;  Service: Orthopedics;  Laterality: Right;   OVARIAN CYST REMOVAL  2001   right   TEMPOROMANDIBULAR JOINT SURGERY  2000   TOTAL HIP ARTHROPLASTY Right 12/28/2019   Procedure: TOTAL HIP ARTHROPLASTY ANTERIOR APPROACH;  Surgeon: Kennedy Bucker, MD;  Location: [facility] ORS;  Service: Orthopedics;  Laterality: Right;   TUBAL LIGATION     Bilateral   VULVA /PERINEUM BIOPSY  04/24/2022   Procedure: VULVAR BIOPSY;  Surgeon: Artelia Laroche, MD;  Location: [facility] ORS;  Service: Gynecology;;   Family History  Problem Relation Age of Onset   Hyperlipidemia Mother    Anxiety disorder Mother    Atrial fibrillation Mother    Hypertension Mother    Memory loss Mother    Anxiety disorder Father    Stroke Father    Hyperlipidemia Father    Hypertension Father    Congestive Heart Failure Father    Depression Father     Hyperlipidemia Brother    Colon cancer Paternal Aunt    Dementia Paternal Aunt    Dementia Maternal Grandmother    Cancer Maternal Grandmother        unk primary; d. late 65s   Dementia Paternal Grandmother    ADD / ADHD Daughter    Breast cancer Neg Hx    Social History   Socioeconomic History   Marital status: Married    Spouse name: Thayer Ohm   Number of children: 3   Years of education: 13   Highest education level: Some college, no degree  Occupational History   Occupation: Disability  Tobacco Use   Smoking status: Never   Smokeless tobacco: Never  Vaping Use   Vaping Use: Never used  Substance and Sexual Activity   Alcohol use: Not Currently    Alcohol/week: 0.0 standard drinks of alcohol   Drug use: No   Sexual activity: Yes    Birth control/protection: Surgical    Comment: Hysterectomy  Other Topics Concern   Not on file  Social History Narrative   Patient drinks 2-3 cups of caffeine daily.   Patient is right handed.       Social Hx:   Current living situation- [place]. Living with husband and 3 kids   Born and Raised- [place].raised by both parents.    Siblings- 1 brother who is 7 yrs older than her   Legal issues- none   Married for 24 yrs. Currently unemployed since 2011 when she hurt her back. HS grad and CNA and phlebotomy.    Social Determinants of Health   Financial Resource Strain: Low Risk  (03/14/2023)   Overall Financial Resource Strain (CARDIA)    Difficulty of Paying Living Expenses: Not hard at all  Food Insecurity: No Food Insecurity (03/14/2023)   Hunger Vital Sign    Worried About Running Out of Food in the Last Year: Never true    Ran Out of Food in the Last Year: Never true  Transportation Needs: No Transportation Needs (03/14/2023)   PRAPARE - Administrator, Civil Service (Medical): No    Lack of Transportation (Non-Medical): No  Physical Activity: Insufficiently Active (03/14/2023)   Exercise Vital Sign    Days of Exercise  per Week: 7 days    Minutes  of Exercise per Session: 10 min  Stress: Stress Concern Present (03/14/2023)   [employer] of Occupational Health - Occupational Stress Questionnaire    Feeling of Stress : To some extent  Social Connections: Socially Integrated (03/14/2023)   Social Connection and Isolation Panel [NHANES]    Frequency of Communication with Friends and Family: More than three times a week    Frequency of Social Gatherings with Friends and Family: More than three times a week    Attends Religious Services: More than 4 times per year    Active Member of [employer] or Organizations: Not on file    Attends Engineer, structural: More than 4 times per year    Marital Status: Married    Tobacco Counseling Counseling given: Not Answered   Clinical Intake:  Pre-visit preparation completed: Yes        Diabetes: No  How often do you need to have someone help you when you read instructions, pamphlets, or other written materials from your doctor or pharmacy?: 3 - Sometimes    Interpreter Needed?: No      Activities of Daily Living    03/14/2023    1:21 PM 04/17/2022   10:54 AM  In your present state of health, do you have any difficulty performing the following activities:  Hearing? 1   Comment Notes difficulty hearing some conversational tones. Does not wear hearing aids   Vision? 0   Difficulty concentrating or making decisions? 1   Comment Medication as directed   Walking or climbing stairs? 1   Comment Cane in use as needed. Paces self with activity   Dressing or bathing? 1   Comment Family assist as needed   Doing errands, shopping? 0 0  Preparing Food and eating ? Y   Comment Family assist as needed with meal prep. Self feeds   Using the Toilet? N   In the past six months, have you accidently leaked urine? N   Do you have problems with loss of bowel control? N   Managing your Medications? Y   Comment Family assist as needed   Managing your  Finances? Y   Comment Family assist as needed   Housekeeping or managing your Housekeeping? Y   Comment Family assist as needed     Patient Care Team: Dale Alliance, MD as PCP - General (Internal Medicine) Lonell Face, MD as Referring Physician (Neurology) Antonieta Iba, MD as Consulting Physician (Cardiology)  Indicate any recent Medical Services you may have received from other than [facility] providers in the past year (date may be approximate).     Assessment:   This is a routine wellness examination for Kathleen Everett.   I connected with  Kathleen Everett on 03/14/23 by a audio enabled telemedicine application and verified that I am speaking with the correct person using two identifiers.  Patient Location: Home  Provider Location: Office/Clinic  I discussed the limitations of evaluation and management by telemedicine. The patient expressed understanding and agreed to proceed.   Hearing/Vision screen Hearing Screening - Comments:: Patient has some difficulty hearing conversational tones. Does not wear hearing aids.     Vision Screening - Comments::  Followed by [facility] Wears corrective lenses They have seen their ophthalmologist in the last 12 months.    Dietary issues and exercise activities discussed: Current Exercise Habits: Home exercise routine, Intensity: Mild Regular diet Good water intake  Goals Addressed  This Visit's Progress     Patient Stated     Weight (lb) < 210 lb (95.3 kg) (pt-stated)   236 lb (107 kg)     Weight goal 195-200lb      Other     Follow up with Primary Care Provider        As needed        Depression Screen    03/14/2023    1:14 PM 01/30/2023   11:27 AM 01/29/2023    8:11 AM 12/05/2022    3:44 PM 10/22/2022    7:37 AM 10/03/2022    9:02 AM 09/05/2022    9:44 AM  PHQ 2/9 Scores  PHQ - 2 Score 3  6  4     PHQ- 9 Score 17  20  6        Information is confidential and restricted. Go to Review Flowsheets  to unlock data.    Fall Risk    03/14/2023    1:20 PM 01/29/2023    8:11 AM 10/22/2022    7:37 AM 07/19/2022    9:06 AM 02/18/2022    2:14 PM  Fall Risk   Falls in the past year? 1 1 1 1  0  Comment None since last reported      Number falls in past yr:  1 1 1  0  Injury with Fall?  1 1 0   Risk for fall due to : History of fall(s) History of fall(s) Impaired balance/gait;History of fall(s) History of fall(s)   Follow up Falls evaluation completed;Falls prevention discussed Falls evaluation completed  Falls evaluation completed Falls evaluation completed    FALL RISK PREVENTION PERTAINING TO THE HOME: Home free of loose throw rugs in walkways, pet beds, electrical cords, etc? Yes  Adequate lighting in your home to reduce risk of falls? Yes   ASSISTIVE DEVICES UTILIZED TO PREVENT FALLS: Life alert? No  Use of a cane, walker or w/c? Yes  Grab bars in the bathroom? Yes  Shower chair or bench in shower? Yes  Elevated toilet seat or a handicapped toilet? Yes   TIMED UP AND GO: Was the test performed? No .   Cognitive Function:    02/14/2021    1:28 PM  MMSE - Mini Mental State Exam  Not completed: Unable to complete        03/14/2023    1:24 PM 02/18/2022    2:49 PM 02/14/2020    1:30 PM  6CIT Screen  What Year? 0 points 0 points 0 points  What month? 0 points 0 points 0 points  What time? 0 points 0 points 0 points  Count back from 20 0 points 0 points 0 points  Months in reverse 0 points 4 points 0 points  Repeat phrase 4 points 4 points 6 points  Total Score 4 points 8 points 6 points    Immunizations Immunization History  Administered Date(s) Administered   Tdap 08/18/2022   Screening Tests Health Maintenance  Topic Date Due   INFLUENZA VACCINE  06/19/2023   MAMMOGRAM  08/10/2023   Medicare Annual Wellness (AWV)  03/13/2024   PAP SMEAR-Modifier  12/19/2027   COLONOSCOPY (Pts 45-19yrs Insurance coverage will need to be confirmed)  03/20/2031   DTaP/Tdap/Td (2 - Td  or Tdap) 08/18/2032   Hepatitis C Screening  Completed   HIV Screening  Completed   HPV VACCINES  Aged Out   COVID-19 Vaccine  Discontinued    Health Maintenance There are no preventive care reminders  to display for this patient.  Lung Cancer Screening: (Low Dose CT Chest recommended if Age 46-80 years, 30 pack-year currently smoking OR have quit w/in 15years.) does not qualify.   Hepatitis C Screening: Completed 02/2021.  Vision Screening: Recommended annual ophthalmology exams for early detection of glaucoma and other disorders of the eye.  Dental Screening: Recommended annual dental exams for proper oral hygiene  Community Resource Referral / Chronic Care Management: CRR required this visit?  No   CCM required this visit?  No      Plan:     I have personally reviewed and noted the following in the patient's chart:   Medical and social history Use of alcohol, tobacco or illicit drugs  Current medications and supplements including opioid prescriptions. Patient is currently taking opioid prescriptions. Information provided to patient regarding non-opioid alternatives. Patient advised to discuss non-opioid treatment plan with their provider.Followed by pain clinic.  Functional ability and status Nutritional status Physical activity Advanced directives List of other physicians Hospitalizations, surgeries, and ER visits in previous 12 months Vitals Screenings to include cognitive, depression, and falls Referrals and appointments  In addition, I have reviewed and discussed with patient certain preventive protocols, quality metrics, and best practice recommendations. A written personalized care plan for preventive services as well as general preventive health recommendations were provided to patient.     Cathey Endow, LPN   [id number]

## 2023-03-14 NOTE — Patient Instructions (Addendum)
[name] , Thank you for taking time to come for your Medicare Wellness Visit. I appreciate your ongoing commitment to your health goals. Please review the following plan we discussed and let me know if I can assist you in the future.   These are the goals we discussed:  Goals       Patient Stated     Weight (lb) < 210 lb (95.3 kg) (pt-stated)      Weight goal 195-200lb      Other     Follow up with Primary Care Provider      As needed         This is a list of the screening recommended for you and due dates:  Health Maintenance  Topic Date Due   Flu Shot  06/19/2023   Mammogram  08/10/2023   Medicare Annual Wellness Visit  03/13/2024   Pap Smear  12/19/2027   Colon Cancer Screening  03/20/2031   DTaP/Tdap/Td vaccine (2 - Td or Tdap) 08/18/2032   Hepatitis C Screening: USPSTF Recommendation to screen - Ages 23-79 yo.  Completed   HIV Screening  Completed   HPV Vaccine  Aged Out   COVID-19 Vaccine  Discontinued    Conditions/risks identified: none new  Next appointment: Follow up in one year for your annual wellness visit.   Preventive Care 40-64 Years, Female Preventive care refers to lifestyle choices and visits with your health care provider that can promote health and wellness. What does preventive care include? A yearly physical exam. This is also called an annual well check. Dental exams once or twice a year. Routine eye exams. Ask your health care provider how often you should have your eyes checked. Personal lifestyle choices, including: Daily care of your teeth and gums. Regular physical activity. Eating a healthy diet. Avoiding tobacco and drug use. Limiting alcohol use. Practicing safe sex. Taking low-dose aspirin daily starting at age 73. Taking vitamin and mineral supplements as recommended by your health care provider. What happens during an annual well check? The services and screenings done by your health care provider during your annual  well check will depend on your age, overall health, lifestyle risk factors, and family history of disease. Counseling  Your health care provider may ask you questions about your: Alcohol use. Tobacco use. Drug use. Emotional well-being. Home and relationship well-being. Sexual activity. Eating habits. Work and work Astronomer. Method of birth control. Menstrual cycle. Pregnancy history. Screening  You may have the following tests or measurements: Height, weight, and BMI. Blood pressure. Lipid and cholesterol levels. These may be checked every 5 years, or more frequently if you are over 83 years old. Skin check. Lung cancer screening. You may have this screening every year starting at age 38 if you have a 30-pack-year history of smoking and currently smoke or have quit within the past 15 years. Fecal occult blood test (FOBT) of the stool. You may have this test every year starting at age 59. Flexible sigmoidoscopy or colonoscopy. You may have a sigmoidoscopy every 5 years or a colonoscopy every 10 years starting at age 17. Hepatitis C blood test. Hepatitis B blood test. Sexually transmitted disease (STD) testing. Diabetes screening. This is done by checking your blood sugar (glucose) after you have not eaten for a while (fasting). You may have this done every 1-3 years. Mammogram. This may be done every 1-2 years. Talk to your health care provider about when you should start having regular mammograms. This may depend  on whether you have a family history of breast cancer. BRCA-related cancer screening. This may be done if you have a family history of breast, ovarian, tubal, or peritoneal cancers. Pelvic exam and Pap test. This may be done every 3 years starting at age 50. Starting at age 27, this may be done every 5 years if you have a Pap test in combination with an HPV test. Bone density scan. This is done to screen for osteoporosis. You may have this scan if you are at high risk for  osteoporosis. Discuss your test results, treatment options, and if necessary, the need for more tests with your health care provider. Vaccines  Your health care provider may recommend certain vaccines, such as: Influenza vaccine. This is recommended every year. Tetanus, diphtheria, and acellular pertussis (Tdap, Td) vaccine. You may need a Td booster every 10 years. Zoster vaccine. You may need this after age 47. Pneumococcal 13-valent conjugate (PCV13) vaccine. You may need this if you have certain conditions and were not previously vaccinated. Pneumococcal polysaccharide (PPSV23) vaccine. You may need one or two doses if you smoke cigarettes or if you have certain conditions. Talk to your health care provider about which screenings and vaccines you need and how often you need them. This information is not intended to replace advice given to you by your health care provider. Make sure you discuss any questions you have with your health care provider. Document Released: 12/01/2015 Document Revised: 07/24/2016 Document Reviewed: 09/05/2015 Elsevier Interactive Patient Education  2017 [employer].    Fall Prevention in the Home Falls can cause injuries. They can happen to people of all ages. There are many things you can do to make your home safe and to help prevent falls. What can I do on the outside of my home? Regularly fix the edges of walkways and driveways and fix any cracks. Remove anything that might make you trip as you walk through a door, such as a raised step or threshold. Trim any bushes or trees on the path to your home. Use bright outdoor lighting. Clear any walking paths of anything that might make someone trip, such as rocks or tools. Regularly check to see if handrails are loose or broken. Make sure that both sides of any steps have handrails. Any raised decks and porches should have guardrails on the edges. Have any leaves, snow, or ice cleared regularly. Use sand or  salt on walking paths during winter. Clean up any spills in your garage right away. This includes oil or grease spills. What can I do in the bathroom? Use night lights. Install grab bars by the toilet and in the tub and shower. Do not use towel bars as grab bars. Use non-skid mats or decals in the tub or shower. If you need to sit down in the shower, use a plastic, non-slip stool. Keep the floor dry. Clean up any water that spills on the floor as soon as it happens. Remove soap buildup in the tub or shower regularly. Attach bath mats securely with double-sided non-slip rug tape. Do not have throw rugs and other things on the floor that can make you trip. What can I do in the bedroom? Use night lights. Make sure that you have a light by your bed that is easy to reach. Do not use any sheets or blankets that are too big for your bed. They should not hang down onto the floor. Have a firm chair that has side arms. You can use  this for support while you get dressed. Do not have throw rugs and other things on the floor that can make you trip. What can I do in the kitchen? Clean up any spills right away. Avoid walking on wet floors. Keep items that you use a lot in easy-to-reach places. If you need to reach something above you, use a strong step stool that has a grab bar. Keep electrical cords out of the way. Do not use floor polish or wax that makes floors slippery. If you must use wax, use non-skid floor wax. Do not have throw rugs and other things on the floor that can make you trip. What can I do with my stairs? Do not leave any items on the stairs. Make sure that there are handrails on both sides of the stairs and use them. Fix handrails that are broken or loose. Make sure that handrails are as long as the stairways. Check any carpeting to make sure that it is firmly attached to the stairs. Fix any carpet that is loose or worn. Avoid having throw rugs at the top or bottom of the stairs. If  you do have throw rugs, attach them to the floor with carpet tape. Make sure that you have a light switch at the top of the stairs and the bottom of the stairs. If you do not have them, ask someone to add them for you. What else can I do to help prevent falls? Wear shoes that: Do not have high heels. Have rubber bottoms. Are comfortable and fit you well. Are closed at the toe. Do not wear sandals. If you use a stepladder: Make sure that it is fully opened. Do not climb a closed stepladder. Make sure that both sides of the stepladder are locked into place. Ask someone to hold it for you, if possible. Clearly mark and make sure that you can see: Any grab bars or handrails. First and last steps. Where the edge of each step is. Use tools that help you move around (mobility aids) if they are needed. These include: Canes. Walkers. Scooters. Crutches. Turn on the lights when you go into a dark area. Replace any light bulbs as soon as they burn out. Set up your furniture so you have a clear path. Avoid moving your furniture around. If any of your floors are uneven, fix them. If there are any pets around you, be aware of where they are. Review your medicines with your doctor. Some medicines can make you feel dizzy. This can increase your chance of falling. Ask your doctor what other things that you can do to help prevent falls. This information is not intended to replace advice given to you by your health care provider. Make sure you discuss any questions you have with your health care provider. Document Released: 08/31/2009 Document Revised: 04/11/2016 Document Reviewed: 12/09/2014 Elsevier Interactive Patient Education  2017 [employer] Opioid Pain Medicine Management Opioids are powerful medicines that are used to treat moderate to severe pain. When used for short periods of time, they can help you to: Sleep better. Do better in physical or occupational therapy. Feel better in the first  few days after an injury. Recover from surgery. Opioids should be taken with the supervision of a trained health care provider. They should be taken for the shortest period of time possible. This is because opioids can be addictive, and the longer you take opioids, the greater your risk of addiction. This addiction can also be called opioid use  disorder. What are the risks? Using opioid pain medicines for longer than 3 days increases your risk of side effects. Side effects include: Constipation. Nausea and vomiting. Breathing difficulties (respiratory depression). Drowsiness. Confusion. Opioid use disorder. Itching. Taking opioid pain medicine for a long period of time can affect your ability to do daily tasks. It also puts you at risk for: Motor vehicle crashes. Depression. Suicide. Heart attack. Overdose, which can be life-threatening. What is a pain treatment plan? A pain treatment plan is an agreement between you and your health care provider. Pain is unique to each person, and treatments vary depending on your condition. To manage your pain, you and your health care provider need to work together. To help you do this: Discuss the goals of your treatment, including how much pain you might expect to have and how you will manage the pain. Review the risks and benefits of taking opioid medicines. Remember that a good treatment plan uses more than one approach and minimizes the chance of side effects. Be honest about the amount of medicines you take and about any drug or alcohol use. Get pain medicine prescriptions from only one health care provider. Pain can be managed with many types of alternative treatments. Ask your health care provider to refer you to one or more specialists who can help you manage pain through: Physical or occupational therapy. Counseling (cognitive behavioral therapy). Good nutrition. Biofeedback. Massage. Meditation. Non-opioid medicine. Following a gentle  exercise program. How to use opioid pain medicine Taking medicine Take your pain medicine exactly as told by your health care provider. Take it only when you need it. If your pain gets less severe, you may take less than your prescribed dose if your health care provider approves. If you are not having pain, do nottake pain medicine unless your health care provider tells you to take it. If your pain is severe, do nottry to treat it yourself by taking more pills than instructed on your prescription. Contact your health care provider for help. Write down the times when you take your pain medicine. It is easy to become confused while on pain medicine. Writing the time can help you avoid overdose. Take other over-the-counter or prescription medicines only as told by your health care provider. Keeping yourself and others safe  While you are taking opioid pain medicine: Do not drive, use machinery, or power tools. Do not sign legal documents. Do not drink alcohol. Do not take sleeping pills. Do not supervise children by yourself. Do not do activities that require climbing or being in high places. Do not go to a lake, river, ocean, spa, or swimming pool. Do not share your pain medicine with anyone. Keep pain medicine in a locked cabinet or in a secure area where pets and children cannot reach it. Stopping your use of opioids If you have been taking opioid medicine for more than a few weeks, you may need to slowly decrease (taper) how much you take until you stop completely. Tapering your use of opioids can decrease your risk of symptoms of withdrawal, such as: Pain and cramping in the abdomen. Nausea. Sweating. Sleepiness. Restlessness. Uncontrollable shaking (tremors). Cravings for the medicine. Do not attempt to taper your use of opioids on your own. Talk with your health care provider about how to do this. Your health care provider may prescribe a step-down schedule based on how much  medicine you are taking and how long you have been taking it. Getting rid of leftover pills Do  not save any leftover pills. Get rid of leftover pills safely by: Taking the medicine to a prescription take-back program. This is usually offered by the county or law enforcement. Bringing them to a pharmacy that has a drug disposal container. Flushing them down the toilet. Check the label or package insert of your medicine to see whether this is safe to do. Throwing them out in the trash. Check the label or package insert of your medicine to see whether this is safe to do. If it is safe to throw it out, remove the medicine from the original container, put it into a sealable bag or container, and mix it with used coffee grounds, food scraps, dirt, or cat litter before putting it in the trash. Follow these instructions at home: Activity Do exercises as told by your health care provider. Avoid activities that make your pain worse. Return to your normal activities as told by your health care provider. Ask your health care provider what activities are safe for you. General instructions You may need to take these actions to prevent or treat constipation: Drink enough fluid to keep your urine pale yellow. Take over-the-counter or prescription medicines. Eat foods that are high in fiber, such as beans, whole grains, and fresh fruits and vegetables. Limit foods that are high in fat and processed sugars, such as fried or sweet foods. Keep all follow-up visits. This is important. Where to find support If you have been taking opioids for a long time, you may benefit from receiving support for quitting from a local support group or counselor. Ask your health care provider for a referral to these resources in your area. Where to find more information Centers for Disease Control and Prevention (CDC): FootballExhibition.com.br [place] Food and Drug Administration (FDA): PumpkinSearch.com.ee Get help right away if: You may have taken too  much of an opioid (overdosed). Common symptoms of an overdose: Your breathing is slower or more shallow than normal. You have a very slow heartbeat (pulse). You have slurred speech. You have nausea and vomiting. Your pupils become very small. You have other potential symptoms: You are very confused. You faint or feel like you will faint. You have cold, clammy skin. You have blue lips or fingernails. You have thoughts of harming yourself or harming others. These symptoms may represent a serious problem that is an emergency. Do not wait to see if the symptoms will go away. Get medical help right away. Call your local emergency services (911 in the [place] Do not drive yourself to the hospital.  If you ever feel like you may hurt yourself or others, or have thoughts about taking your own life, get help right away. Go to your nearest emergency department or: Call your local emergency services (911 in the [place] Call the [facility] ((417)346-5807 in the [place] Call a suicide crisis helpline, such as the National Suicide Prevention Lifeline at 714-205-8764 or 988 in the [place] This is open 24 hours a day in the [place] Text the Crisis Text Line at 715-001-0043 (in the [place] Summary Opioid medicines can help you manage moderate to severe pain for a short period of time. A pain treatment plan is an agreement between you and your health care provider. Discuss the goals of your treatment, including how much pain you might expect to have and how you will manage the pain. If you think that you or someone else may have taken too much of an opioid, get medical help right away. This information  is not intended to replace advice given to you by your health care provider. Make sure you discuss any questions you have with your health care provider. Document Revised: 05/30/2021 Document Reviewed: 02/14/2021 Elsevier Patient Education  2023 [employer].

## 2023-03-24 ENCOUNTER — Encounter (HOSPITAL_COMMUNITY): Payer: Self-pay

## 2023-03-25 DIAGNOSIS — G43019 Migraine without aura, intractable, without status migrainosus: Secondary | ICD-10-CM | POA: Diagnosis not present

## 2023-04-01 DIAGNOSIS — G894 Chronic pain syndrome: Secondary | ICD-10-CM | POA: Diagnosis not present

## 2023-04-01 DIAGNOSIS — G2581 Restless legs syndrome: Secondary | ICD-10-CM | POA: Diagnosis not present

## 2023-04-01 DIAGNOSIS — M5412 Radiculopathy, cervical region: Secondary | ICD-10-CM | POA: Diagnosis not present

## 2023-04-01 DIAGNOSIS — M47816 Spondylosis without myelopathy or radiculopathy, lumbar region: Secondary | ICD-10-CM | POA: Diagnosis not present

## 2023-04-01 DIAGNOSIS — M5137 Other intervertebral disc degeneration, lumbosacral region: Secondary | ICD-10-CM | POA: Diagnosis not present

## 2023-04-17 ENCOUNTER — Telehealth (HOSPITAL_BASED_OUTPATIENT_CLINIC_OR_DEPARTMENT_OTHER): Payer: Medicare PPO | Admitting: Psychiatry

## 2023-04-17 DIAGNOSIS — F411 Generalized anxiety disorder: Secondary | ICD-10-CM | POA: Diagnosis not present

## 2023-04-17 DIAGNOSIS — F331 Major depressive disorder, recurrent, moderate: Secondary | ICD-10-CM | POA: Diagnosis not present

## 2023-04-17 DIAGNOSIS — F5105 Insomnia due to other mental disorder: Secondary | ICD-10-CM

## 2023-04-17 MED ORDER — METHYLPHENIDATE HCL 10 MG PO TABS
15.0000 mg | ORAL_TABLET | Freq: Every day | ORAL | 0 refills | Status: DC
Start: 2023-04-17 — End: 2023-06-16

## 2023-04-17 MED ORDER — SERTRALINE HCL 100 MG PO TABS: 200.0000 mg | ORAL_TABLET | Freq: Every day | ORAL | 0 refills | Status: DC

## 2023-04-17 NOTE — Progress Notes (Signed)
Virtual Visit via Video Note  I connected with Kathleen Everett on 04/17/23 at 10:30 AM EDT by a video enabled telemedicine application and verified that I am speaking with the correct person using two identifiers.  Location: Patient: home Provider: office   I discussed the limitations of evaluation and management by telemedicine and the availability of in person appointments. The patient expressed understanding and agreed to proceed.  History of Present Illness: Kathleen Everett shares she has been taking Ritalin 10mg  daily from an old stash of 20mg  tabs. She states the Ritalin 10mg  prescription was never received by [facility].  Kathleen Everett's focus is poor and she continues to jump from task to task. Some days she feels the depression is stable. The Ritalin and Zoloft help. In the last 2 weeks she was depressed about 8 days. She still has crying spells.  Kathleen Everett wants to be involved in things especially things that her children do. She notes she gets angry and upset very easily. It takes her time to calm down and she recognizes that she needs to let the upset go. The anger festers inside for a long time. Her anhedonia is a little better but her back pain really limits what she can do. Her sleep is poor most nights but some nights she does sleep better. Her mind does not shut off. Her appetite is good. She has ongoing negative self talk. She feels a lot of guilt and feels like a burden to her family. She denies SI/HI. Her anxiety comes and goes thru out the day. Kathleen Everett will distract herself by playing with the dog, going outside, listen to music or read the Bible to calm herself when overly anxious.  She denies SE from meds.   Observations/Objective: Psychiatric Specialty Exam: ROS  Last menstrual period 12/08/2006.There is no height or weight on file to calculate BMI.  General Appearance: Casual  Eye Contact:  Good  Speech:  Clear and Coherent and Normal Rate  Volume:  Normal  Mood:  Anxious and  Depressed  Affect:  Congruent  Thought Process:  Goal Directed, Linear, and Descriptions of Associations: Intact  Orientation:  Full (Time, Place, and Person)  Thought Content:  Logical  Suicidal Thoughts:  No  Homicidal Thoughts:  No  Memory:  Immediate;   Good  Judgement:  Good  Insight:  Good  Psychomotor Activity:  Normal  Concentration:  Concentration: Good  Recall:  Good  Fund of Knowledge:  Good  Language:  Good  Akathisia:  No  Handed:  Right  AIMS (if indicated):     Assets:  Communication Skills Desire for Improvement Financial Resources/Insurance Housing Resilience Talents/Skills Transportation Vocational/Educational  ADL's:  Intact  Cognition:  WNL  Sleep:        Assessment and Plan:     04/17/2023   10:42 AM 03/14/2023    1:14 PM 01/30/2023   11:27 AM 01/29/2023    8:11 AM 12/05/2022    3:44 PM  Depression screen PHQ 2/9  Decreased Interest 2 2 3 3 3   Down, Depressed, Hopeless 2 1 3 3 3   PHQ - 2 Score 4 3 6 6 6   Altered sleeping 2 2 2 2 3   Tired, decreased energy 3 3 3 3 3   Change in appetite 0 2 3 2  0  Feeling bad or failure about yourself  3 3 3 3 3   Trouble concentrating 3 3 3 3 3   Moving slowly or fidgety/restless 0 1 1 1  0  Suicidal thoughts 0 0 0 0 0  PHQ-9 Score 15 17 21 20 18   Difficult doing work/chores Very difficult Very difficult Extremely dIfficult Extremely dIfficult Very difficult    Flowsheet Row Video Visit from 04/17/2023 in [facility] Video Visit from 01/30/2023 in [facility] Video Visit from 12/05/2022 in [facility]  C-SSRS RISK CATEGORY No Risk No Risk No Risk          Pt is aware that these meds carry a teratogenic risk. Pt will discuss plan of action if she does or plans to become pregnant in the future.  Status of current problems: ongoing depression and anxiety symptoms, poor sleep   Medication  management with supportive therapy. Risks and benefits, side effects and alternative treatment options discussed with patient. Pt was given an opportunity to ask questions about medication, illness, and treatment. All current psychiatric medications have been reviewed and discussed with the patient and adjusted as clinically appropriate.  Pt verbalized understanding and verbal consent obtained for treatment.  Meds: increase Ritalin 15mg  po qD for adjustment treatment of depression 1. MDD (major depressive disorder), recurrent episode, moderate (HCC) - sertraline (ZOLOFT) 100 MG tablet; Take 2 tablets (200 mg total) by mouth daily.  Dispense: 180 tablet; Refill: 0 - methylphenidate (RITALIN) 10 MG tablet; Take 1.5 tablets (15 mg total) by mouth daily.  Dispense: 45 tablet; Refill: 0 - methylphenidate (RITALIN) 10 MG tablet; Take 1.5 tablets (15 mg total) by mouth daily.  Dispense: 45 tablet; Refill: 0 - methylphenidate (RITALIN) 10 MG tablet; Take 1.5 tablets (15 mg total) by mouth daily.  Dispense: 45 tablet; Refill: 0  2. Generalized anxiety disorder - sertraline (ZOLOFT) 100 MG tablet; Take 2 tablets (200 mg total) by mouth daily.  Dispense: 180 tablet; Refill: 0  3. Insomnia due to other mental disorder     Labs: none    Therapy: brief supportive therapy provided. Discussed psychosocial stressors in detail.     Collaboration of Care: Other none today  Patient/Guardian was advised Release of Information must be obtained prior to any record release in order to collaborate their care with an outside provider. Patient/Guardian was advised if they have not already done so to contact the registration department to sign all necessary forms in order for [place] to release information regarding their care.   Consent: Patient/Guardian gives verbal consent for treatment and assignment of benefits for services provided during this visit. Patient/Guardian expressed understanding and agreed to proceed.       Follow Up Instructions: Follow up in 3 months or sooner if needed with a new psychiatrist  Patient informed that I am leaving [facility] in 05/2023 and I relayed that they will be getting a new provider after that. Patient verbalized understanding and agreed with the plan.     I discussed the assessment and treatment plan with the patient. The patient was provided an opportunity to ask questions and all were answered. The patient agreed with the plan and demonstrated an understanding of the instructions.   The patient was advised to call back or seek an in-person evaluation if the symptoms worsen or if the condition fails to improve as anticipated.  I provided 20 minutes of non-face-to-face time during this encounter.   Oletta Darter, MD

## 2023-04-21 DIAGNOSIS — G8929 Other chronic pain: Secondary | ICD-10-CM | POA: Diagnosis not present

## 2023-04-21 DIAGNOSIS — E669 Obesity, unspecified: Secondary | ICD-10-CM | POA: Diagnosis not present

## 2023-04-21 DIAGNOSIS — M5136 Other intervertebral disc degeneration, lumbar region: Secondary | ICD-10-CM | POA: Diagnosis not present

## 2023-04-21 DIAGNOSIS — M461 Sacroiliitis, not elsewhere classified: Secondary | ICD-10-CM | POA: Diagnosis not present

## 2023-04-21 DIAGNOSIS — M5441 Lumbago with sciatica, right side: Secondary | ICD-10-CM | POA: Diagnosis not present

## 2023-04-21 DIAGNOSIS — Q0700 Arnold-Chiari syndrome without spina bifida or hydrocephalus: Secondary | ICD-10-CM | POA: Diagnosis not present

## 2023-04-22 ENCOUNTER — Other Ambulatory Visit: Payer: Medicare PPO

## 2023-04-24 DIAGNOSIS — M26633 Articular disc disorder of bilateral temporomandibular joint: Secondary | ICD-10-CM | POA: Diagnosis not present

## 2023-04-24 DIAGNOSIS — M26623 Arthralgia of bilateral temporomandibular joint: Secondary | ICD-10-CM | POA: Diagnosis not present

## 2023-04-24 DIAGNOSIS — M7911 Myalgia of mastication muscle: Secondary | ICD-10-CM | POA: Diagnosis not present

## 2023-04-29 DIAGNOSIS — Z5181 Encounter for therapeutic drug level monitoring: Secondary | ICD-10-CM | POA: Diagnosis not present

## 2023-04-29 DIAGNOSIS — M1611 Unilateral primary osteoarthritis, right hip: Secondary | ICD-10-CM | POA: Diagnosis not present

## 2023-04-29 DIAGNOSIS — G2581 Restless legs syndrome: Secondary | ICD-10-CM | POA: Diagnosis not present

## 2023-04-29 DIAGNOSIS — M25551 Pain in right hip: Secondary | ICD-10-CM | POA: Diagnosis not present

## 2023-04-29 DIAGNOSIS — M47816 Spondylosis without myelopathy or radiculopathy, lumbar region: Secondary | ICD-10-CM | POA: Diagnosis not present

## 2023-04-29 DIAGNOSIS — M160 Bilateral primary osteoarthritis of hip: Secondary | ICD-10-CM | POA: Diagnosis not present

## 2023-04-29 DIAGNOSIS — M5137 Other intervertebral disc degeneration, lumbosacral region: Secondary | ICD-10-CM | POA: Diagnosis not present

## 2023-04-29 DIAGNOSIS — M5442 Lumbago with sciatica, left side: Secondary | ICD-10-CM | POA: Diagnosis not present

## 2023-04-29 DIAGNOSIS — Z79899 Other long term (current) drug therapy: Secondary | ICD-10-CM | POA: Diagnosis not present

## 2023-04-29 DIAGNOSIS — M5412 Radiculopathy, cervical region: Secondary | ICD-10-CM | POA: Diagnosis not present

## 2023-04-29 DIAGNOSIS — G894 Chronic pain syndrome: Secondary | ICD-10-CM | POA: Diagnosis not present

## 2023-05-13 ENCOUNTER — Encounter: Payer: Self-pay | Admitting: Nurse Practitioner

## 2023-05-13 ENCOUNTER — Encounter: Payer: Self-pay | Admitting: Internal Medicine

## 2023-05-13 ENCOUNTER — Telehealth (INDEPENDENT_AMBULATORY_CARE_PROVIDER_SITE_OTHER): Payer: Medicare PPO | Admitting: Nurse Practitioner

## 2023-05-13 VITALS — Temp 101.0°F | Wt 225.0 lb

## 2023-05-13 DIAGNOSIS — U071 COVID-19: Secondary | ICD-10-CM

## 2023-05-13 MED ORDER — MOLNUPIRAVIR EUA 200MG CAPSULE
4.0000 | ORAL_CAPSULE | Freq: Two times a day (BID) | ORAL | 0 refills | Status: AC
Start: 2023-05-13 — End: 2023-05-18

## 2023-05-13 MED ORDER — BENZONATATE 200 MG PO CAPS: 200.0000 mg | ORAL_CAPSULE | Freq: Two times a day (BID) | ORAL | 0 refills | Status: DC | PRN

## 2023-05-13 MED ORDER — PREDNISONE 10 MG PO TABS
ORAL_TABLET | ORAL | 0 refills | Status: DC
Start: 2023-05-13 — End: 2023-06-16

## 2023-05-13 NOTE — Telephone Encounter (Signed)
PT SCHEDULED FOR VIRTUAL WITH [facility].

## 2023-05-13 NOTE — Progress Notes (Signed)
Virtual Visit via Video Note  I connected with Kathleen Everett on 05/13/23  at 4:05 PM by a video enabled telemedicine application and verified that I am speaking with the correct person using two identifiers.  Patient Location: Home Provider Location: Office/Clinic  I discussed the limitations, risks, security, and privacy concerns of performing an evaluation and management service by video and the availability of in person appointments. I also discussed with the patient that there may be a patient responsible charge related to this service. The patient expressed understanding and agreed to proceed.  Subjective: PCP: Dale [place], MD  Chief Complaint  Patient presents with   Acute Visit    Sore throat, cough, sinus drainage, runny nose, fever, cold chills Took 2 different Covid tests at home on 05/13/23 and both was Positive   URI  This is a new problem. The current episode started in the past 7 days. The problem has been unchanged. The maximum temperature recorded prior to her arrival was 100.4 - 100.9 F. Associated symptoms include coughing, rhinorrhea, sinus pain and a sore throat. The treatment provided mild relief.     ROS: Per HPI  Current Outpatient Medications:    acetaminophen (TYLENOL) 325 MG tablet, Take 500 mg by mouth every 6 (six) hours as needed., Disp: , Rfl:    AIMOVIG 140 MG/ML SOAJ, Inject 140 mg into the skin every 28 (twenty-eight) days., Disp: , Rfl:    atorvastatin (LIPITOR) 10 MG tablet, Take 1 tablet (10 mg total) by mouth daily., Disp: 90 tablet, Rfl: 1   azelastine (ASTELIN) 0.1 % nasal spray, Place 1 spray into both nostrils 2 (two) times daily. Use in each nostril as directed, Disp: 30 mL, Rfl: 1   benzonatate (TESSALON) 200 MG capsule, Take 1 capsule (200 mg total) by mouth 2 (two) times daily as needed for cough., Disp: 20 capsule, Rfl: 0   clobetasol (TEMOVATE) 0.05 % external solution, Apply topically daily as needed., Disp: , Rfl:     Crisaborole (EUCRISA) 2 % OINT, Apply twice a day to hands prn flares, Disp: 60 g, Rfl: 2   cyanocobalamin (VITAMIN B12) 1000 MCG/ML injection, INJECT 1 ML INTRAMUSCULARLY  ONCE EVERY MONTH, Disp: 10 mL, Rfl: 0   diclofenac Sodium (VOLTAREN) 1 % GEL, SMARTSIG:Gram(s) Topical 4 Times Daily PRN, Disp: , Rfl:    esomeprazole (NEXIUM) 40 MG capsule, Take 1 capsule by mouth once daily, Disp: 90 capsule, Rfl: 3   imiquimod (ALDARA) 5 % cream, Use 1/3 packet per application. Apply to affected area of vulva on Monday, Wednesday, Friday nights as directed. Wash off following morning with soap and water. Apply barrier cream to nontreatment areas., Disp: 16 each, Rfl: 0   lactase (RA DAIRY AID) 3000 units tablet, Take 9,000 Units by mouth 3 (three) times daily as needed (with dairy consumption). , Disp: , Rfl:    Magnesium Glycinate 100 MG CAPS, Take 1 tablet by mouth daily., Disp: 30 capsule, Rfl: 0   methocarbamol (ROBAXIN) 500 MG tablet, Take 500 mg by mouth 4 (four) times daily., Disp: , Rfl:    methylphenidate (RITALIN) 10 MG tablet, Take 1.5 tablets (15 mg total) by mouth daily., Disp: 45 tablet, Rfl: 0   methylphenidate (RITALIN) 10 MG tablet, Take 1.5 tablets (15 mg total) by mouth daily., Disp: 45 tablet, Rfl: 0   methylphenidate (RITALIN) 10 MG tablet, Take 1.5 tablets (15 mg total) by mouth daily., Disp: 45 tablet, Rfl: 0   mupirocin ointment (BACTROBAN) 2 %,  Apply to healing wound TID., Disp: 22 g, Rfl: 1   nystatin cream (MYCOSTATIN), Apply topically 2 (two) times daily., Disp: 30 g, Rfl: 0   oxyCODONE-acetaminophen (PERCOCET) 10-325 MG tablet, Take by mouth., Disp: , Rfl:    predniSONE (DELTASONE) 10 MG tablet, TAKE 3 TABLETS PO QD FOR 3 DAYS THEN TAKE 2 TABLETS PO QD FOR 3 DAYS THEN TAKE 1 TABLET PO QD FOR 3 DAYS THEN TAKE 1/2 TAB PO QD FOR 3 DAYS, Disp: 20 tablet, Rfl: 0   predniSONE (DELTASONE) 10 MG tablet, Take 4 tablets ( total 40 mg) by mouth for 2 days; take 3 tablets ( total 30 mg) by  mouth for 2 days; take 2 tablets ( total 20 mg) by mouth for 1 day; take 1 tablet ( total 10 mg) by mouth for 1 day., Disp: 17 tablet, Rfl: 0   rizatriptan (MAXALT) 10 MG tablet, Take 10 mg by mouth as needed for migraine. May repeat in 2 hours if needed, Disp: , Rfl:    Ruxolitinib Phosphate (OPZELURA) 1.5 % CREA, Apply twice daily to hands and feet, Disp: 60 g, Rfl: 3   sertraline (ZOLOFT) 100 MG tablet, Take 2 tablets (200 mg total) by mouth daily., Disp: 180 tablet, Rfl: 0   Simethicone (GAS RELIEF PO), Take 125 mg by mouth 2 (two) times daily as needed for flatulence. Anti gas, Disp: , Rfl:    tacrolimus (PROTOPIC) 0.1 % ointment, APPLY TOPICALLY TWO (2) (TWO) TIMES DAILY., Disp: 100 g, Rfl: 2   topiramate (TOPAMAX) 25 MG capsule, Take 25 mg by mouth 2 (two) times daily., Disp: , Rfl:    Ubrogepant (UBRELVY) 100 MG TABS, Take 100 mg by mouth 2 (two) times daily as needed., Disp: , Rfl:    XTAMPZA ER 9 MG C12A, Take 1 capsule by mouth 2 (two) times daily., Disp: , Rfl:   Observations/Objective: Today's Vitals   05/13/23 1603  Temp: (!) 101 F (38.3 C)  Weight: 225 lb (102.1 kg)   Physical Exam Constitutional:      General: She is not in acute distress.    Appearance: Normal appearance.  Pulmonary:     Effort: No respiratory distress.  Neurological:     Mental Status: She is alert and oriented to person, place, and time. Mental status is at baseline.  Psychiatric:        Speech: Speech normal.        Behavior: Behavior normal.        Thought Content: Thought content normal.     Assessment and Plan: COVID Assessment & Plan: Will treat with molnupiravir, prednisone and Tessalon Perles. Advised patient to take over-the-counter Tylenol or ibuprofen for fever and pain. Increase fluid intake and rest. 5 days of isolation  Isolation Instructions: You are to isolate at home for 5 days from onset of your symptoms. If you must be around other household members who do not have  symptoms, you need to make sure that both you and the family members are masking consistently with a high-quality mask.   After day 5 of isolation, if you have had no fever within 24 hours and you are feeling better, you can end isolation but need to mask for an additional 5 days.   After day 5 if you have a fever or are having significant symptoms, please isolate for full 10 days.   If you note any worsening of symptoms despite treatment, please seek an in-person evaluation ASAP. If you note any significant  shortness of breath or any chest pain, please seek ER evaluation.    Orders: -     predniSONE; Take 4 tablets ( total 40 mg) by mouth for 2 days; take 3 tablets ( total 30 mg) by mouth for 2 days; take 2 tablets ( total 20 mg) by mouth for 1 day; take 1 tablet ( total 10 mg) by mouth for 1 day.  Dispense: 17 tablet; Refill: 0 -     molnupiravir EUA; Take 4 capsules (800 mg total) by mouth 2 (two) times daily for 5 days.  Dispense: 40 capsule; Refill: 0  Other orders -     Benzonatate; Take 1 capsule (200 mg total) by mouth 2 (two) times daily as needed for cough.  Dispense: 20 capsule; Refill: 0    Follow Up Instructions: No follow-ups on file.   I discussed the assessment and treatment plan with the patient. The patient was provided an opportunity to ask questions, and all were answered. The patient agreed with the plan and demonstrated an understanding of the instructions.   The patient was advised to call back or seek an in-person evaluation if the symptoms worsen or if the condition fails to improve as anticipated.  The above assessment and management plan was discussed with the patient. The patient verbalized understanding of and has agreed to the management plan.   Kara Dies, NP

## 2023-05-20 DIAGNOSIS — M5116 Intervertebral disc disorders with radiculopathy, lumbar region: Secondary | ICD-10-CM | POA: Diagnosis not present

## 2023-05-20 DIAGNOSIS — M48061 Spinal stenosis, lumbar region without neurogenic claudication: Secondary | ICD-10-CM | POA: Diagnosis not present

## 2023-05-20 DIAGNOSIS — M5126 Other intervertebral disc displacement, lumbar region: Secondary | ICD-10-CM | POA: Diagnosis not present

## 2023-05-20 DIAGNOSIS — M5136 Other intervertebral disc degeneration, lumbar region: Secondary | ICD-10-CM | POA: Diagnosis not present

## 2023-05-20 DIAGNOSIS — M5137 Other intervertebral disc degeneration, lumbosacral region: Secondary | ICD-10-CM | POA: Diagnosis not present

## 2023-05-20 DIAGNOSIS — M5117 Intervertebral disc disorders with radiculopathy, lumbosacral region: Secondary | ICD-10-CM | POA: Diagnosis not present

## 2023-05-20 DIAGNOSIS — M5127 Other intervertebral disc displacement, lumbosacral region: Secondary | ICD-10-CM | POA: Diagnosis not present

## 2023-05-20 DIAGNOSIS — M4316 Spondylolisthesis, lumbar region: Secondary | ICD-10-CM | POA: Diagnosis not present

## 2023-05-23 DIAGNOSIS — U071 COVID-19: Secondary | ICD-10-CM | POA: Insufficient documentation

## 2023-05-23 NOTE — Assessment & Plan Note (Signed)
Will treat with molnupiravir, prednisone and Tessalon Perles. Advised patient to take over-the-counter Tylenol or ibuprofen for fever and pain. Increase fluid intake and rest. 5 days of isolation  Isolation Instructions: You are to isolate at home for 5 days from onset of your symptoms. If you must be around other household members who do not have symptoms, you need to make sure that both you and the family members are masking consistently with a high-quality mask.   After day 5 of isolation, if you have had no fever within 24 hours and you are feeling better, you can end isolation but need to mask for an additional 5 days.   After day 5 if you have a fever or are having significant symptoms, please isolate for full 10 days.   If you note any worsening of symptoms despite treatment, please seek an in-person evaluation ASAP. If you note any significant shortness of breath or any chest pain, please seek ER evaluation.

## 2023-06-12 DIAGNOSIS — M26633 Articular disc disorder of bilateral temporomandibular joint: Secondary | ICD-10-CM | POA: Diagnosis not present

## 2023-06-12 DIAGNOSIS — M26631 Articular disc disorder of right temporomandibular joint: Secondary | ICD-10-CM | POA: Diagnosis not present

## 2023-06-12 DIAGNOSIS — M26609 Unspecified temporomandibular joint disorder, unspecified side: Secondary | ICD-10-CM | POA: Diagnosis not present

## 2023-06-12 DIAGNOSIS — Z0289 Encounter for other administrative examinations: Secondary | ICD-10-CM

## 2023-06-16 ENCOUNTER — Encounter (INDEPENDENT_AMBULATORY_CARE_PROVIDER_SITE_OTHER): Payer: Self-pay | Admitting: Family Medicine

## 2023-06-16 ENCOUNTER — Ambulatory Visit (INDEPENDENT_AMBULATORY_CARE_PROVIDER_SITE_OTHER): Payer: Medicare PPO | Admitting: Family Medicine

## 2023-06-16 VITALS — BP 101/68 | HR 72 | Temp 98.8°F | Ht 69.0 in | Wt 221.0 lb

## 2023-06-16 DIAGNOSIS — Z6832 Body mass index (BMI) 32.0-32.9, adult: Secondary | ICD-10-CM | POA: Diagnosis not present

## 2023-06-16 DIAGNOSIS — E669 Obesity, unspecified: Secondary | ICD-10-CM

## 2023-06-16 DIAGNOSIS — G894 Chronic pain syndrome: Secondary | ICD-10-CM | POA: Diagnosis not present

## 2023-06-17 NOTE — Progress Notes (Signed)
Office: [phone]  /  Fax: [phone]   Initial Visit  Kathleen Everett was seen in clinic today to evaluate for obesity. She is interested in losing weight to improve overall health and reduce the risk of weight related complications. She presents today to review program treatment options, initial physical assessment, and evaluation.     She was referred by: PCP  Some associated conditions: Arthritis  Contributing factors: Medications, Reduced physical activity, and Eating patterns  Weight promoting medications identified: Steroids  Current nutrition plan: None  Current level of physical activity: None  Current or previous pharmacotherapy: GLP-1(Ozempic)  Response to medication: Lost weight initially but was unable to sustain weight loss   Past medical history includes:   Past Medical History:  Diagnosis Date   Abdominal pain 07/24/2014   Abscess of right hip 05/11/2020   Anemia 01/02/2020   Ankle pain, right 04/24/2014   Breast pain, right 09/08/2014   Chest pain 02/15/2015   Chiari malformation type I 01/26/2014   s/p sgy decompression   Chronic back pain 12/13/2012   Chronic gastritis 07/18/2015   Chronic tension-type headache, intractable 04/24/2015   DDD (degenerative disc disease), lumbar    Diverticulitis    Dysmenorrhea    Dysplastic nevus 10/09/2015   R abdomen - moderate, limited margins free.   Endometriosis    Generalized anxiety disorder 05/01/2015   Has previously seen psychiatry. Having some issues with what she feels is related to stress and anxiety   GERD (gastroesophageal reflux disease)    Hemorrhoids 12/23/2020   Hidradenitis    History of ovarian cyst    Hot flashes 03/12/2015   Hypercholesteremia 01/10/2014   Off lipitor. Elevated cholesterol. Labs discussed. Restart lipitor. Follow lipid panel and liver function tests.   Hyperglycemia 06/24/2019   Major depressive disorder 05/01/2015   Migraine headaches 01/10/2014   Has seen neurology.  Topamax.   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 Level is 21.6     Objective:   BP 101/68   Pulse 72   Temp 98.8 F (37.1 C)   Ht 5\' 9"  (1.753 m)   Wt 221 lb (100.2 kg)   LMP 12/08/2006   SpO2 99%   BMI 32.64 kg/m  She was weighed on the bioimpedance scale: Body mass index is 32.64 kg/m.  Peak Weight: 319 lbs ,Visceral Fat Rating:10, Body Fat%:43.3.  General:  Alert, oriented and cooperative. Patient is in no acute distress.  Respiratory: Normal respiratory effort, no problems with respiration noted  Extremities: Normal range of motion.    Mental Status: Normal mood and affect. Normal behavior. Normal judgment and thought content.   Assessment and Plan:  1. Chronic pain syndrome Patient has multiple health issues contributing to chronic pain.  Plan: We will start workup for weight loss, and we will work on treating obesity.  2. BMI 32.0-32.9,adult  3. Class 1 obesity with serious comorbidity and body mass index (BMI) of 32.0 to 32.9 in adult,  unspecified obesity type We reviewed weight, biometrics, associated medical conditions and contributing factors with patient. She would benefit from weight loss therapy via a modified calorie, low-carb, high-protein nutritional plan tailored to their REE (resting energy expenditure) which will be determined by indirect calorimetry.  We will also assess for cardiometabolic risk and nutritional derangements via fasting serologies at her next appointment.      Obesity  Treatment / Action Plan:  Patient will work on garnering support from family and friends to begin weight loss journey. Will complete provided nutritional and psychosocial assessment questionnaire before the next appointment.  Obesity Education Performed Today:  She was weighed on the bioimpedance scale and results were discussed and documented in the synopsis.  We discussed obesity as a disease and the importance of a more detailed evaluation of all the factors contributing to the disease.  We discussed the importance of long term lifestyle changes which include nutrition, exercise and behavioral modifications as well as the importance of customizing this to her specific health and social needs.  We discussed the benefits of reaching a healthier weight to alleviate the symptoms of existing conditions and reduce the risks of the biomechanical, metabolic and psychological effects of obesity.  Kathleen Everett appears to be in the action stage of change and states they are ready to start intensive lifestyle modifications and behavioral modifications.  45 minutes was spent today on this visit including the above counseling, pre-visit chart review, and post-visit documentation.  Reviewed by clinician on day of visit: allergies, medications, problem list, medical history, surgical history, family history, social history, and previous encounter notes.   I, Burt Knack, am acting as transcriptionist for Quillian Quince, MD   I have reviewed the above documentation for accuracy and completeness, and I agree with the above. Quillian Quince, MD

## 2023-06-18 ENCOUNTER — Ambulatory Visit: Payer: Medicare HMO

## 2023-06-23 DIAGNOSIS — G894 Chronic pain syndrome: Secondary | ICD-10-CM | POA: Diagnosis not present

## 2023-06-23 DIAGNOSIS — M5441 Lumbago with sciatica, right side: Secondary | ICD-10-CM | POA: Diagnosis not present

## 2023-06-23 DIAGNOSIS — M461 Sacroiliitis, not elsewhere classified: Secondary | ICD-10-CM | POA: Diagnosis not present

## 2023-06-23 DIAGNOSIS — E669 Obesity, unspecified: Secondary | ICD-10-CM | POA: Diagnosis not present

## 2023-06-24 DIAGNOSIS — M47816 Spondylosis without myelopathy or radiculopathy, lumbar region: Secondary | ICD-10-CM | POA: Diagnosis not present

## 2023-06-24 DIAGNOSIS — M5412 Radiculopathy, cervical region: Secondary | ICD-10-CM | POA: Diagnosis not present

## 2023-06-24 DIAGNOSIS — M5137 Other intervertebral disc degeneration, lumbosacral region: Secondary | ICD-10-CM | POA: Diagnosis not present

## 2023-06-24 DIAGNOSIS — G2581 Restless legs syndrome: Secondary | ICD-10-CM | POA: Diagnosis not present

## 2023-07-02 ENCOUNTER — Ambulatory Visit (HOSPITAL_COMMUNITY): Payer: Medicare PPO | Admitting: Student

## 2023-07-03 ENCOUNTER — Encounter (INDEPENDENT_AMBULATORY_CARE_PROVIDER_SITE_OTHER): Payer: Self-pay
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
## 2023-07-09 ENCOUNTER — Inpatient Hospital Stay: Payer: Medicare PPO

## 2023-07-10 ENCOUNTER — Ambulatory Visit (INDEPENDENT_AMBULATORY_CARE_PROVIDER_SITE_OTHER): Payer: Medicare PPO | Admitting: Psychiatry

## 2023-07-10 ENCOUNTER — Encounter: Payer: Self-pay | Admitting: Psychiatry

## 2023-07-10 DIAGNOSIS — F331 Major depressive disorder, recurrent, moderate: Secondary | ICD-10-CM

## 2023-07-10 DIAGNOSIS — F411 Generalized anxiety disorder: Secondary | ICD-10-CM

## 2023-07-10 MED ORDER — SERTRALINE HCL 100 MG PO TABS
200.0000 mg | ORAL_TABLET | Freq: Every day | ORAL | 0 refills | Status: DC
Start: 2023-07-16 — End: 2023-08-28

## 2023-07-10 NOTE — Patient Instructions (Signed)
Increase sertraline 200 mg daily  Next appointment: 10/10 at 4 pm

## 2023-07-14 ENCOUNTER — Ambulatory Visit (INDEPENDENT_AMBULATORY_CARE_PROVIDER_SITE_OTHER): Payer: Medicare PPO | Admitting: Family Medicine

## 2023-07-14 ENCOUNTER — Encounter (INDEPENDENT_AMBULATORY_CARE_PROVIDER_SITE_OTHER): Payer: Self-pay | Admitting: Family Medicine

## 2023-07-14 VITALS — BP 101/68 | HR 66 | Temp 98.0°F | Ht 69.5 in | Wt 219.0 lb

## 2023-07-14 DIAGNOSIS — R739 Hyperglycemia, unspecified: Secondary | ICD-10-CM | POA: Diagnosis not present

## 2023-07-14 DIAGNOSIS — E559 Vitamin D deficiency, unspecified: Secondary | ICD-10-CM

## 2023-07-14 DIAGNOSIS — R5383 Other fatigue: Secondary | ICD-10-CM

## 2023-07-14 DIAGNOSIS — Z6831 Body mass index (BMI) 31.0-31.9, adult: Secondary | ICD-10-CM

## 2023-07-14 DIAGNOSIS — E669 Obesity, unspecified: Secondary | ICD-10-CM | POA: Diagnosis not present

## 2023-07-14 DIAGNOSIS — R0602 Shortness of breath: Secondary | ICD-10-CM | POA: Diagnosis not present

## 2023-07-14 DIAGNOSIS — E782 Mixed hyperlipidemia: Secondary | ICD-10-CM | POA: Diagnosis not present

## 2023-07-14 DIAGNOSIS — E538 Deficiency of other specified B group vitamins: Secondary | ICD-10-CM | POA: Diagnosis not present

## 2023-07-14 DIAGNOSIS — Z1331 Encounter for screening for depression: Secondary | ICD-10-CM

## 2023-07-14 DIAGNOSIS — E7800 Pure hypercholesterolemia, unspecified: Secondary | ICD-10-CM | POA: Insufficient documentation

## 2023-07-14 DIAGNOSIS — F32A Depression, unspecified: Secondary | ICD-10-CM | POA: Diagnosis not present

## 2023-07-14 NOTE — Progress Notes (Signed)
Chief Complaint:   OBESITY Kathleen Everett (MR# [id number]) is a 52 y.o. female who presents for evaluation and treatment of obesity and related comorbidities. Current BMI is Body mass index is 31.88 kg/m. Kathleen Everett has been struggling with her weight for many years and has been unsuccessful in either losing weight, maintaining weight loss, or reaching her healthy weight goal.  Kathleen Everett is currently in the action stage of change and ready to dedicate time achieving and maintaining a healthier weight. Kathleen Everett is interested in becoming our patient and working on intensive lifestyle modifications including (but not limited to) diet and exercise for weight loss.  Patient has lost approximately 100 pounds over the last 2 to 3 years on Ozempic.  She had to stop due to lack of insurance coverage.  Kathleen Everett's habits were reviewed today and are as follows: Her family eats meals together, she thinks her family will eat healthier with her, her desired weight loss is 44 lbs, she has been heavy most of her life, she started gaining weight after back injections, her heaviest weight ever was 320 pounds, she has significant food cravings issues, she snacks frequently in the evenings, she skips meals frequently, she frequently makes poor food choices, and she frequently eats larger portions than normal.  Depression Screen Kathleen Everett's Food and Mood (modified PHQ-9) score was 16.  Subjective:   1. Other fatigue Kathleen Everett admits to daytime somnolence and admits to waking up still tired. Patient has a history of symptoms of daytime fatigue and morning fatigue. Kathleen Everett generally gets 2 or 3, some 4 or 5 hours of sleep per night, and states that she has nightime awakenings. Snoring is not present. Apneic episodes are not present. Epworth Sleepiness Score is 7.   2. SOBOE (shortness of breath on exertion) Kathleen Everett notes increasing shortness of breath with exercising and seems to be worsening over time with weight gain. She  notes getting out of breath sooner with activity than she used to. This has not gotten worse recently. Kathleen Everett denies shortness of breath at rest or orthopnea.  3. Mixed hyperlipidemia Patient is on Lipitor, and she is working on her diet and weight loss.  4. Vitamin D deficiency Patient is on vitamin D supplementation, and she is due for labs.  She notes fatigue.  5. B12 deficiency Patient is on B12 supplementation, and she is due for labs.  She notes fatigue.  6. Hyperglycemia Patient has a history of elevated A1c's in the past, and she has struggled to lose weight.  Assessment/Plan:   1. Other fatigue Kathleen Everett does feel that her weight is causing her energy to be lower than it should be. Fatigue may be related to obesity, depression or many other causes. Labs will be ordered, and in the meanwhile, Aimar will focus on self care including making healthy food choices, increasing physical activity and focusing on stress reduction.  - EKG 12-Lead - CBC with Differential/Platelet - Folate - Magnesium - Phosphorus - TSH  2. SOBOE (shortness of breath on exertion) Kathleen Everett does feel that she gets out of breath more easily that she used to when she exercises. Kathleen Everett's shortness of breath appears to be obesity related and exercise induced. She has agreed to work on weight loss and gradually increase exercise to treat her exercise induced shortness of breath. Will continue to monitor closely.  - EKG 12-Lead  3. Mixed hyperlipidemia We will check labs today.  Patient will start her category 2 plan, and we will  follow-up at her next visit in 2 weeks.  - Lipid Panel With LDL/HDL Ratio  4. Vitamin D deficiency We will check labs today, and we will follow-up at patient's next visit.  - VITAMIN D 25 Hydroxy (Vit-D Deficiency, Fractures)  5. B12 deficiency We will check labs today, and we will follow-up at patient's next visit.  - Vitamin B12  6. Hyperglycemia We will check labs today.   Patient will start her category 2 plan, and we will follow-up at her next visit in 2 weeks.  - CMP14+EGFR - Insulin, random - Hemoglobin A1c  7. Depression screening Kathleen Everett had a positive depression screening. Depression is commonly associated with obesity and often results in emotional eating behaviors. We will monitor this closely and work on CBT to help improve the non-hunger eating patterns. Referral to Psychology may be required if no improvement is seen as she continues in our clinic.  8. BMI 31.0-31.9,adult  9. Obesity, Beginning BMI 31.9 Kathleen Everett is currently in the action stage of change and her goal is to continue with weight loss efforts. I recommend Kathleen Everett begin the structured treatment plan as follows:  She has agreed to the Category 2 Plan + 100 calories.  Exercise goals: No exercise has been prescribed for now, while we concentrate on nutritional changes.   Behavioral modification strategies: increasing lean protein intake and no skipping meals.  She was informed of the importance of frequent follow-up visits to maximize her success with intensive lifestyle modifications for her multiple health conditions. She was informed we would discuss her lab results at her next visit unless there is a critical issue that needs to be addressed sooner. Kathleen Everett agreed to keep her next visit at the agreed upon time to discuss these results.  Objective:   Blood pressure 101/68, pulse 66, temperature 98 F (36.7 C), height 5' 9.5" (1.765 m), weight 219 lb (99.3 kg), last menstrual period 12/08/2006, SpO2 94%. Body mass index is 31.88 kg/m.  EKG: Normal sinus rhythm, rate 66 BPM.  Indirect Calorimeter completed today shows a VO2 of 240 and a REE of 1656.  Her calculated basal metabolic rate is 5409 thus her basal metabolic rate is worse than expected.  General: Cooperative, alert, well developed, in no acute distress. HEENT: Conjunctivae and lids unremarkable. Cardiovascular: Regular rhythm.   Lungs: Normal work of breathing. Neurologic: No focal deficits.   Lab Results  Component Value Date   CREATININE 0.75 10/22/2022   BUN 13 10/22/2022   NA 138 10/22/2022   K 4.5 10/22/2022   CL 101 10/22/2022   CO2 32 10/22/2022   Lab Results  Component Value Date   ALT 33 10/22/2022   AST 28 10/22/2022   ALKPHOS 84 10/22/2022   BILITOT 0.3 10/22/2022   Lab Results  Component Value Date   HGBA1C 5.3 10/22/2022   HGBA1C 5.4 07/19/2022   HGBA1C 5.6 03/06/2022   HGBA1C 5.5 07/17/2021   HGBA1C 5.7 03/01/2021   No results found for: "INSULIN" Lab Results  Component Value Date   TSH 1.59 07/17/2021   Lab Results  Component Value Date   CHOL 255 (H) 10/22/2022   HDL 49.60 10/22/2022   LDLCALC 172 (H) 10/22/2022   LDLDIRECT 181.0 06/26/2020   TRIG 168.0 (H) 10/22/2022   CHOLHDL 5 10/22/2022   Lab Results  Component Value Date   WBC 5.6 07/19/2022   HGB 13.3 07/19/2022   HCT 38.9 07/19/2022   MCV 87.4 07/19/2022   PLT 185.0 07/19/2022   Lab  Results  Component Value Date   IRON 58 06/26/2020   FERRITIN 46.6 06/26/2020   Attestation Statements:   Reviewed by clinician on day of visit: allergies, medications, problem list, medical history, surgical history, family history, social history, and previous encounter notes.  Time spent on visit including pre-visit chart review and post-visit charting and care was 40 minutes.   I, Burt Knack, am acting as transcriptionist for Quillian Quince, MD.  I have reviewed the above documentation for accuracy and completeness, and I agree with the above. - Quillian Quince, MD

## 2023-07-15 ENCOUNTER — Encounter: Payer: Self-pay | Admitting: Family Medicine

## 2023-07-15 ENCOUNTER — Ambulatory Visit (INDEPENDENT_AMBULATORY_CARE_PROVIDER_SITE_OTHER): Payer: Medicare PPO | Admitting: Family Medicine

## 2023-07-15 VITALS — BP 118/66 | HR 70 | Temp 97.8°F | Resp 16 | Ht 69.5 in | Wt 222.0 lb

## 2023-07-15 DIAGNOSIS — M545 Low back pain, unspecified: Secondary | ICD-10-CM | POA: Diagnosis not present

## 2023-07-15 DIAGNOSIS — G8929 Other chronic pain: Secondary | ICD-10-CM | POA: Diagnosis not present

## 2023-07-15 LAB — CBC WITH DIFFERENTIAL/PLATELET
Basophils Absolute: 0 10*3/uL (ref 0.0–0.2)
Basos: 1 %
EOS (ABSOLUTE): 0.1 10*3/uL (ref 0.0–0.4)
Eos: 3 %
Hematocrit: 40.8 % (ref 34.0–46.6)
Hemoglobin: 13.7 g/dL (ref 11.1–15.9)
Immature Grans (Abs): 0 10*3/uL (ref 0.0–0.1)
Immature Granulocytes: 0 %
Lymphocytes Absolute: 1.6 10*3/uL (ref 0.7–3.1)
Lymphs: 32 %
MCH: 30 pg (ref 26.6–33.0)
MCHC: 33.6 g/dL (ref 31.5–35.7)
MCV: 90 fL (ref 79–97)
Monocytes Absolute: 0.3 10*3/uL (ref 0.1–0.9)
Monocytes: 6 %
Neutrophils Absolute: 2.9 10*3/uL (ref 1.4–7.0)
Neutrophils: 58 %
Platelets: 204 10*3/uL (ref 150–450)
RBC: 4.56 x10E6/uL (ref 3.77–5.28)
RDW: 12.5 % (ref 11.7–15.4)
WBC: 4.9 10*3/uL (ref 3.4–10.8)

## 2023-07-15 LAB — CMP14+EGFR
ALT: 17 IU/L (ref 0–32)
AST: 17 IU/L (ref 0–40)
Albumin: 4.5 g/dL (ref 3.9–4.9)
Alkaline Phosphatase: 83 IU/L (ref 44–121)
BUN/Creatinine Ratio: 16 (ref 9–23)
BUN: 13 mg/dL (ref 6–24)
Bilirubin Total: 0.2 mg/dL (ref 0.0–1.2)
CO2: 27 mmol/L (ref 20–29)
Calcium: 9.5 mg/dL (ref 8.7–10.2)
Chloride: 105 mmol/L (ref 96–106)
Creatinine, Ser: 0.8 mg/dL (ref 0.57–1.00)
Globulin, Total: 2.3 g/dL (ref 1.5–4.5)
Glucose: 75 mg/dL (ref 70–99)
Potassium: 4.8 mmol/L (ref 3.5–5.2)
Sodium: 142 mmol/L (ref 134–144)
Total Protein: 6.8 g/dL (ref 6.0–8.5)
eGFR: 90 mL/min/{1.73_m2} (ref >59)

## 2023-07-15 LAB — INSULIN, RANDOM: INSULIN: 14.5 u[IU]/mL (ref 2.6–24.9)

## 2023-07-15 LAB — FOLATE: Folate: 14.9 ng/mL (ref >3.0)

## 2023-07-15 LAB — MAGNESIUM: Magnesium: 2.4 mg/dL — ABNORMAL HIGH (ref 1.6–2.3)

## 2023-07-15 LAB — VITAMIN D 25 HYDROXY (VIT D DEFICIENCY, FRACTURES): Vit D, 25-Hydroxy: 33.7 ng/mL (ref 30.0–100.0)

## 2023-07-15 LAB — TSH: TSH: 2.63 u[IU]/mL (ref 0.450–4.500)

## 2023-07-15 LAB — LIPID PANEL WITH LDL/HDL RATIO
Cholesterol, Total: 278 mg/dL — ABNORMAL HIGH (ref 100–199)
HDL: 47 mg/dL (ref >39)
LDL Chol Calc (NIH): 188 mg/dL — ABNORMAL HIGH (ref 0–99)
LDL/HDL Ratio: 4 ratio — ABNORMAL HIGH (ref 0.0–3.2)
Triglycerides: 227 mg/dL — ABNORMAL HIGH (ref 0–149)
VLDL Cholesterol Cal: 43 mg/dL — ABNORMAL HIGH (ref 5–40)

## 2023-07-15 LAB — PHOSPHORUS: Phosphorus: 4.3 mg/dL (ref 3.0–4.3)

## 2023-07-15 LAB — VITAMIN B12: Vitamin B-12: 489 pg/mL (ref 232–1245)

## 2023-07-15 LAB — HEMOGLOBIN A1C
Est. average glucose Bld gHb Est-mCnc: 100 mg/dL
Hgb A1c MFr Bld: 5.1 % (ref 4.8–5.6)

## 2023-07-15 MED ORDER — KETOROLAC TROMETHAMINE 60 MG/2ML IM SOLN
30.0000 mg | Freq: Once | INTRAMUSCULAR | Status: AC
Start: 2023-07-15 — End: 2023-07-15
  Administered 2023-07-15: 30 mg via INTRAMUSCULAR

## 2023-07-15 NOTE — Assessment & Plan Note (Addendum)
Acute on Chronic.  Improving.  No indication for imaging given no trauma or worsening symptoms.  6/10 pain, decreased form 10/10 Give Toradol 30 mg IM now Follow up with pain clinic in am

## 2023-07-15 NOTE — Patient Instructions (Addendum)
It was a pleasure meeting you today. Thank you for allowing me to take part in your health care.  Our goals for today as we discussed include:  You have received Toradol 30 mg injection today for back pain. Do not take any other NSAIDs, Ibuprofen, Aleve, Voltaren pills for at least 8 hours after injection  Continue current medication prescribed by pain management and follow up with them tomorrow as scheduled   If you have any questions or concerns, please do not hesitate to call the office at [phone].  I look forward to our next visit and until then take care and stay safe.  Regards,   Dana Allan, MD   [facility]

## 2023-07-15 NOTE — Progress Notes (Signed)
SUBJECTIVE:   Chief Complaint  Patient presents with   Back Pain   HPI Presents to clinic for acute visit  History of Chronic back pain.  Flare up started 1 week ago.  10/10, now 6/10.  Called pain clinic but unable to be seen until tomorrow.  Has taken regular pain medication without relief.  Denies any fevers, trauma, saddle anesthesia, loss of bowel or bladder, no urinary symptoms.  Pain is same as previously.  Has had flare ups in past and taken Prednisone.    PERTINENT PMH / PSH: Chronic back pain  OBJECTIVE:  BP 118/66   Pulse 70   Temp 97.8 F (36.6 C)   Resp 16   Ht 5' 9.5" (1.765 m)   Wt 222 lb (100.7 kg)   LMP 12/08/2006   SpO2 95%   BMI 32.31 kg/m    Physical Exam Vitals reviewed.  Constitutional:      General: She is not in acute distress.    Appearance: Normal appearance. She is normal weight. She is not ill-appearing, toxic-appearing or diaphoretic.  Eyes:     General:        Right eye: No discharge.        Left eye: No discharge.     Conjunctiva/sclera: Conjunctivae normal.  Cardiovascular:     Rate and Rhythm: Normal rate.  Pulmonary:     Effort: Pulmonary effort is normal.  Musculoskeletal:        General: Tenderness present.     Thoracic back: Tenderness present. No bony tenderness. Decreased range of motion.     Lumbar back: Tenderness present. No bony tenderness. Decreased range of motion.  Skin:    General: Skin is warm and dry.  Neurological:     General: No focal deficit present.     Mental Status: She is alert and oriented to person, place, and time. Mental status is at baseline.  Psychiatric:        Mood and Affect: Mood normal.        Behavior: Behavior normal.        Thought Content: Thought content normal.        Judgment: Judgment normal.        07/15/2023   11:52 AM 07/10/2023   10:07 AM 05/13/2023    4:05 PM 04/17/2023   10:42 AM 03/14/2023    1:14 PM  Depression screen PHQ 2/9  Decreased Interest 3  0  2  Down,  Depressed, Hopeless 2  0  1  PHQ - 2 Score 5  0  3  Altered sleeping 2  0  2  Tired, decreased energy 3  0  3  Change in appetite 1  0  2  Feeling bad or failure about yourself  3  0  3  Trouble concentrating 3  0  3  Moving slowly or fidgety/restless 0  0  1  Suicidal thoughts 0  0  0  PHQ-9 Score 17  0  17  Difficult doing work/chores Not difficult at all  Not difficult at all  Very difficult     Information is confidential and restricted. Go to Review Flowsheets to unlock data.      05/13/2023    4:05 PM 01/29/2023    8:12 AM  GAD 7 : Generalized Anxiety Score  Nervous, Anxious, on Edge 0 3  Control/stop worrying 0 3  Worry too much - different things 0 3  Trouble relaxing 0 2  Restless 0 2  Easily annoyed or irritable 0 2  Afraid - awful might happen 0 1  Total GAD 7 Score 0 16  Anxiety Difficulty Not difficult at all Very difficult    ASSESSMENT/PLAN:  Chronic low back pain, unspecified back pain laterality, unspecified whether sciatica present Assessment & Plan: Acute on Chronic.  Improving.  No indication for imaging given no trauma or worsening symptoms.  6/10 pain, decreased form 10/10 Give Toradol 30 mg IM now Follow up with pain clinic in am  Orders: -     Ketorolac Tromethamine   PDMP reviewed  Return if symptoms worsen or fail to improve, for PCP.  Dana Allan, MD

## 2023-07-16 DIAGNOSIS — M47816 Spondylosis without myelopathy or radiculopathy, lumbar region: Secondary | ICD-10-CM | POA: Diagnosis not present

## 2023-07-16 DIAGNOSIS — G2581 Restless legs syndrome: Secondary | ICD-10-CM | POA: Diagnosis not present

## 2023-07-16 DIAGNOSIS — M533 Sacrococcygeal disorders, not elsewhere classified: Secondary | ICD-10-CM | POA: Diagnosis not present

## 2023-07-16 DIAGNOSIS — M5412 Radiculopathy, cervical region: Secondary | ICD-10-CM | POA: Diagnosis not present

## 2023-07-24 ENCOUNTER — Ambulatory Visit (INDEPENDENT_AMBULATORY_CARE_PROVIDER_SITE_OTHER): Payer: Medicare PPO | Admitting: Internal Medicine

## 2023-07-24 ENCOUNTER — Encounter: Payer: Self-pay | Admitting: Internal Medicine

## 2023-07-24 VITALS — BP 128/72 | HR 77 | Temp 99.5°F | Ht 70.0 in | Wt 218.2 lb

## 2023-07-24 DIAGNOSIS — W540XXA Bitten by dog, initial encounter: Secondary | ICD-10-CM

## 2023-07-24 DIAGNOSIS — F325 Major depressive disorder, single episode, in full remission: Secondary | ICD-10-CM | POA: Diagnosis not present

## 2023-07-24 DIAGNOSIS — E538 Deficiency of other specified B group vitamins: Secondary | ICD-10-CM

## 2023-07-24 DIAGNOSIS — S61258A Open bite of other finger without damage to nail, initial encounter: Secondary | ICD-10-CM

## 2023-07-24 DIAGNOSIS — K219 Gastro-esophageal reflux disease without esophagitis: Secondary | ICD-10-CM

## 2023-07-24 DIAGNOSIS — D649 Anemia, unspecified: Secondary | ICD-10-CM | POA: Diagnosis not present

## 2023-07-24 DIAGNOSIS — E7800 Pure hypercholesterolemia, unspecified: Secondary | ICD-10-CM | POA: Diagnosis not present

## 2023-07-24 DIAGNOSIS — G8929 Other chronic pain: Secondary | ICD-10-CM

## 2023-07-24 DIAGNOSIS — R739 Hyperglycemia, unspecified: Secondary | ICD-10-CM

## 2023-07-24 DIAGNOSIS — Z0000 Encounter for general adult medical examination without abnormal findings: Secondary | ICD-10-CM

## 2023-07-24 DIAGNOSIS — L732 Hidradenitis suppurativa: Secondary | ICD-10-CM | POA: Diagnosis not present

## 2023-07-24 DIAGNOSIS — Z1231 Encounter for screening mammogram for malignant neoplasm of breast: Secondary | ICD-10-CM

## 2023-07-24 DIAGNOSIS — G43809 Other migraine, not intractable, without status migrainosus: Secondary | ICD-10-CM

## 2023-07-24 DIAGNOSIS — N903 Dysplasia of vulva, unspecified: Secondary | ICD-10-CM

## 2023-07-24 DIAGNOSIS — Q0700 Arnold-Chiari syndrome without spina bifida or hydrocephalus: Secondary | ICD-10-CM

## 2023-07-24 DIAGNOSIS — M545 Low back pain, unspecified: Secondary | ICD-10-CM

## 2023-07-24 MED ORDER — ESTRADIOL 0.1 MG/GM VA CREA: 1.0000 | TOPICAL_CREAM | VAGINAL | 0 refills | Status: DC

## 2023-07-24 MED ORDER — DOXYCYCLINE HYCLATE 100 MG PO TABS: 100.0000 mg | ORAL_TABLET | Freq: Two times a day (BID) | ORAL | 0 refills | Status: DC

## 2023-07-24 MED ORDER — REPATHA SURECLICK 140 MG/ML ~~LOC~~ SOAJ: 140.0000 mg | SUBCUTANEOUS | 3 refills | Status: DC

## 2023-07-24 NOTE — Progress Notes (Signed)
Subjective:    Patient ID: Kathleen Everett, female    DOB: [birth year], 52 y.o.   MRN: [id number]  Patient here for  Chief Complaint  Patient presents with   Annual Exam    HPI Here for a physical exam. Was seen acute visit - back pain. Is followed by pain clinic.  S/p bilateral sacroiliac joint corticosteroid injections. . She continues xtampza, Oxycodone, Gabapentin, methocarbamal and ibuprofen. Discussed spinal cord stimulator. Also had f/u with NSU 06/23/23. Plan evaluation for spinal cord stimulator and continue pain medication. Followed by neurology for migraines - ubrelvy, aimovig and rizatriptan.  Seeing psychiatry for depression.  On zoloft. 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  Prescribed aldara. Has f/u planned.  Some drainage and dripping nose. Flares intermittently.  Has nasal spray if needed.  No chest congestion or cough.  No chest pain or sob.  No abdominal pain.  Hidradinitis. Discussed labs.  Increased cholesterol.  Has been off statin.  Has restarted.  Pain with statin.  Discussed repatha.  Dog bite yesterday.  Dog up to date with vaccinations.  Index finger - right.    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   Endometriosis    Generalized anxiety disorder 05/01/2015   Has previously seen psychiatry. Having some issues with  what she feels is related to stress and anxiety   GERD (gastroesophageal reflux disease)    Hemorrhoids 12/23/2020   Hidradenitis    High blood pressure    History of ovarian cyst    Hot flashes 03/12/2015   Hypercholesteremia 01/10/2014   Off lipitor. Elevated cholesterol. Labs discussed. Restart lipitor. Follow lipid panel and liver function tests.   Hyperglycemia 06/24/2019   IBS (irritable bowel syndrome)    Joint pain    Major depressive disorder 05/01/2015   Migraine headaches 01/10/2014   Has seen neurology. Topamax.   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 Level is 21.6   Past Surgical History:  Procedure Laterality Date   ABDOMINAL HYSTERECTOMY  03/27/07   laparoscopic   BREAST EXCISIONAL BIOPSY Left 2004   fibroadenoma  CESAREAN SECTION     1999/2004 twins   chiari decompression     COLONOSCOPY WITH PROPOFOL N/A 04/28/2015   Procedure: COLONOSCOPY WITH PROPOFOL;  Surgeon: Scot Jun, MD;  Location: [facility] ENDOSCOPY;  Service: Endoscopy;  Laterality: N/A;   COLONOSCOPY WITH PROPOFOL N/A 03/19/2021   Procedure: COLONOSCOPY WITH PROPOFOL;  Surgeon: Regis Bill, MD;  Location: [facility] ENDOSCOPY;  Service: Endoscopy;  Laterality: N/A;   COLPOSCOPY N/A 04/24/2022   Procedure: COLPOSCOPY, ABLATION OF VULVAR TISSUE;  Surgeon: Artelia Laroche, MD;  Location: [facility] ORS;  Service: Gynecology;  Laterality:  N/A;   CYSTOSCOPY     bladder ulcers   ESOPHAGOGASTRODUODENOSCOPY N/A 04/28/2015   Procedure: ESOPHAGOGASTRODUODENOSCOPY (EGD);  Surgeon: Scot Jun, MD;  Location: [facility] ENDOSCOPY;  Service: Endoscopy;  Laterality: N/A;   ESOPHAGOGASTRODUODENOSCOPY (EGD) WITH PROPOFOL N/A 03/19/2021   Procedure: ESOPHAGOGASTRODUODENOSCOPY (EGD) WITH PROPOFOL;  Surgeon: Regis Bill, MD;  Location: [facility] ENDOSCOPY;  Service: Endoscopy;  Laterality: N/A;   HERNIA REPAIR Right 2003   inguinal   INCISION AND DRAINAGE HIP Right 05/11/2020   Procedure: IRRIGATION AND DEBRIDEMENT HIP;  Surgeon: Kennedy Bucker, MD;  Location: [facility] ORS;  Service: Orthopedics;  Laterality: Right;   OVARIAN CYST REMOVAL  2001   right   TEMPOROMANDIBULAR JOINT SURGERY  2000   TOTAL HIP ARTHROPLASTY Right 12/28/2019   Procedure: TOTAL HIP ARTHROPLASTY ANTERIOR APPROACH;  Surgeon: Kennedy Bucker, MD;  Location: [facility] ORS;  Service: Orthopedics;  Laterality: Right;   TUBAL LIGATION     Bilateral   VULVA /PERINEUM BIOPSY  04/24/2022   Procedure: VULVAR BIOPSY;  Surgeon: Artelia Laroche, MD;  Location: [facility] ORS;  Service: Gynecology;;   Family History  Problem Relation Age of Onset   Hyperlipidemia Mother    Anxiety disorder Mother    Atrial fibrillation Mother    Hypertension Mother    Memory loss Mother    Anxiety disorder Father    Stroke Father    Hyperlipidemia Father    Hypertension Father    Congestive Heart Failure Father    Depression Father    Hyperlipidemia Brother    Colon cancer Paternal Aunt    Dementia Paternal Aunt    Dementia Maternal Grandmother    Cancer Maternal Grandmother        unk primary; d. late 63s   Dementia Paternal Grandmother    ADD / ADHD Daughter    Breast cancer Neg Hx    Social History   Socioeconomic History   Marital status: Married    Spouse name: Thayer Ohm   Number of children: 3   Years of education: 13   Highest education level: Some college, no degree  Occupational  History   Occupation: Disability  Tobacco Use   Smoking status: Never   Smokeless tobacco: Never  Vaping Use   Vaping status: Never Used  Substance and Sexual Activity   Alcohol use: Not Currently    Alcohol/week: 0.0 standard drinks of alcohol   Drug use: No   Sexual activity: Yes    Birth control/protection: Surgical    Comment: Hysterectomy  Other Topics Concern   Not on file  Social History Narrative   Patient drinks 2-3 cups of caffeine daily.   Patient is right handed.       Social Hx:   Current living situation- [place]. Living with husband and 3 kids   Born and Raised- [place]raised by both parents.    Siblings- 1 brother who is 7 yrs older than  her   Legal issues- none   Married for 24 yrs. Currently unemployed since 2011 when she hurt her back. HS grad and CNA and phlebotomy.    Social Determinants of Health   Financial Resource Strain: Medium Risk (04/20/2023)   Received from [facility], [facility]   Overall Financial Resource Strain (CARDIA)    Difficulty of Paying Living Expenses: Somewhat hard  Food Insecurity: Food Insecurity Present (04/20/2023)   Received from [facility], [facility]   Hunger Vital Sign    Worried About Running Out of Food in the Last Year: Sometimes true    Ran Out of Food in the Last Year: Sometimes true  Transportation Needs: No Transportation Needs (04/20/2023)   Received from [facility], [facility]   PRAPARE - Transportation    Lack of Transportation (Medical): No    Lack of Transportation (Non-Medical): No  Physical Activity: Unknown (04/20/2023)   Received from [facility], [facility]   Exercise Vital Sign    Days of Exercise per Week: 0 days    Minutes of Exercise per Session: Not on file  Recent Concern: Physical Activity - Insufficiently Active (03/14/2023)   Exercise Vital Sign    Days of Exercise per Week: 7 days    Minutes of Exercise per Session: 10 min  Stress: Stress Concern Present (04/20/2023)   Received  from [facility], [facility] - Occupational Stress Questionnaire    Feeling of Stress : Very much  Social Connections: Socially Isolated (04/20/2023)   Received from [facility], [facility]   Social Network    How would you rate your social network (family, work, friends)?: Little participation, lonely and socially isolated     Review of Systems  Constitutional:  Negative for appetite change and unexpected weight change.  HENT:  Negative for congestion, sinus pressure and sore throat.   Eyes:  Negative for pain and visual disturbance.  Respiratory:  Negative for cough, chest tightness and shortness of breath.   Cardiovascular:  Negative for chest pain, palpitations and leg swelling.  Gastrointestinal:  Negative for abdominal pain, diarrhea, nausea and vomiting.  Genitourinary:  Negative for difficulty urinating and dysuria.  Musculoskeletal:  Positive for back pain. Negative for joint swelling.  Skin:  Negative for color change and rash.  Neurological:  Negative for dizziness and headaches.  Hematological:  Negative for adenopathy. Does not bruise/bleed easily.  Psychiatric/Behavioral:  Negative for agitation and dysphoric mood.        Objective:     BP 128/72   Pulse 77   Temp 99.5 F (37.5 C) (Oral)   Ht 5\' 10"  (1.778 m)   Wt 218 lb 3.2 oz (99 kg)   LMP 12/08/2006   SpO2 97%   BMI 31.31 kg/m  Wt Readings from Last 3 Encounters:  07/24/23 218 lb 3.2 oz (99 kg)  07/15/23 222 lb (100.7 kg)  07/14/23 219 lb (99.3 kg)    Physical Exam Vitals reviewed.  Constitutional:      General: She is not in acute distress.    Appearance: Normal appearance. She is well-developed.  HENT:     Head: Normocephalic and atraumatic.     Right Ear: External ear normal.     Left Ear: External ear normal.  Eyes:     General: No scleral icterus.       Right eye: No discharge.        Left eye: No discharge.  Conjunctiva/sclera:  Conjunctivae normal.  Neck:     Thyroid: No thyromegaly.  Cardiovascular:     Rate and Rhythm: Normal rate and regular rhythm.  Pulmonary:     Effort: No tachypnea, accessory muscle usage or respiratory distress.     Breath sounds: Normal breath sounds. No decreased breath sounds or wheezing.  Chest:  Breasts:    Right: No inverted nipple, mass, nipple discharge or tenderness (no axillary adenopathy).     Left: No inverted nipple, mass, nipple discharge or tenderness (no axilarry adenopathy).  Abdominal:     General: Bowel sounds are normal.     Palpations: Abdomen is soft.     Tenderness: There is no abdominal tenderness.  Musculoskeletal:        General: No swelling or tenderness.     Cervical back: Neck supple. No tenderness.  Lymphadenopathy:     Cervical: No cervical adenopathy.  Skin:    Findings: No erythema or rash.  Neurological:     Mental Status: She is alert and oriented to person, place, and time.  Psychiatric:        Mood and Affect: Mood normal.        Behavior: Behavior normal.      Outpatient Encounter Medications as of 07/24/2023  Medication Sig   doxycycline (VIBRA-TABS) 100 MG tablet Take 1 tablet (100 mg total) by mouth 2 (two) times daily.   Evolocumab (REPATHA SURECLICK) 140 MG/ML SOAJ Inject 140 mg into the skin every 14 (fourteen) days.   [DISCONTINUED] estradiol (ESTRACE) 0.1 MG/GM vaginal cream Place 1 Applicatorful vaginally.   acetaminophen (TYLENOL) 325 MG tablet Take 500 mg by mouth every 6 (six) hours as needed.   AIMOVIG 140 MG/ML SOAJ Inject 140 mg into the skin every 28 (twenty-eight) days.   Crisaborole (EUCRISA) 2 % OINT Apply twice a day to hands prn flares   cyanocobalamin (VITAMIN B12) 1000 MCG/ML injection INJECT 1 ML INTRAMUSCULARLY  ONCE EVERY MONTH   diclofenac Sodium (VOLTAREN) 1 % GEL SMARTSIG:Gram(s) Topical 4 Times Daily PRN   esomeprazole (NEXIUM) 40 MG capsule Take 1 capsule by mouth once daily   estradiol (ESTRACE) 0.1 MG/GM  vaginal cream Place 1 Applicatorful vaginally 2 (two) times a week.   HYDROcodone-acetaminophen (NORCO) 10-325 MG tablet Take 1 tablet by mouth every 6 (six) hours as needed.   lactase (RA DAIRY AID) 3000 units tablet Take 9,000 Units by mouth 3 (three) times daily as needed (with dairy consumption).    methocarbamol (ROBAXIN) 500 MG tablet Take 500 mg by mouth 4 (four) times daily.   oxyCODONE ER (XTAMPZA ER) 9 MG C12A Take by mouth 2 times daily at 12 noon and 4 pm.   rizatriptan (MAXALT) 10 MG tablet Take 10 mg by mouth as needed for migraine. May repeat in 2 hours if needed   sertraline (ZOLOFT) 100 MG tablet Take 2 tablets (200 mg total) by mouth daily.   Simethicone (GAS RELIEF PO) Take 125 mg by mouth 2 (two) times daily as needed for flatulence. Anti gas   topiramate (TOPAMAX) 100 MG tablet Take 100 mg by mouth at bedtime.   Ubrogepant (UBRELVY) 100 MG TABS Take 100 mg by mouth 2 (two) times daily as needed.   XTAMPZA ER 9 MG C12A Take 1 capsule by mouth 2 (two) times daily.   [DISCONTINUED] atorvastatin (LIPITOR) 10 MG tablet Take 1 tablet (10 mg total) by mouth daily.   No facility-administered encounter medications on file as of 07/24/2023.  Lab Results  Component Value Date   WBC 4.9 07/14/2023   HGB 13.7 07/14/2023   HCT 40.8 07/14/2023   PLT 204 07/14/2023   GLUCOSE 75 07/14/2023   CHOL 278 (H) 07/14/2023   TRIG 227 (H) 07/14/2023   HDL 47 07/14/2023   LDLDIRECT 181.0 06/26/2020   LDLCALC 188 (H) 07/14/2023   ALT 17 07/14/2023   AST 17 07/14/2023   NA 142 07/14/2023   K 4.8 07/14/2023   CL 105 07/14/2023   CREATININE 0.80 07/14/2023   BUN 13 07/14/2023   CO2 27 07/14/2023   TSH 2.630 07/14/2023   INR 1.1 12/03/2019   HGBA1C 5.1 07/14/2023    MR LUMBAR SPINE WO CONTRAST  Result Date: 08/27/2022 CLINICAL DATA:  Low back pain bilateral leg pain. EXAM: MRI LUMBAR SPINE WITHOUT CONTRAST TECHNIQUE: Multiplanar, multisequence MR imaging of the lumbar spine was  performed. No intravenous contrast was administered. COMPARISON:  Lumbar MRI 08/31/2010 FINDINGS: Segmentation:  5 lumbar vertebra.  Lowest disc space L5-S1 Alignment:  Mild retrolisthesis L2-3 and L3-4 Vertebrae:  Negative for fracture or mass.  Normal bone marrow. Conus medullaris and cauda equina: Conus extends to the L1-2 level. Conus and cauda equina appear normal. Paraspinal and other soft tissues: Negative for paraspinous mass or adenopathy Disc levels: L1-2: Mild facet degeneration. Negative for disc protrusion or stenosis L2-3: Disc degeneration with disc bulging and bilateral facet degeneration. No significant spinal stenosis L3-4: Mild disc degeneration with diffuse disc bulging and bilateral facet degeneration. Mild subarticular stenosis bilaterally. Central canal patent L4-5: Disc degeneration with diffuse disc bulging and shallow central disc protrusion. Bilateral facet degeneration. Mild subarticular stenosis bilaterally. Central canal patent L5-S1: Negative IMPRESSION: Mild lumbar degenerative change. Mild subarticular stenosis bilaterally L3-4 and L4-5. Progressive disc degeneration at L2-3, L3-4, L4-5 since 2011 Electronically Signed   By: Marlan Palau M.D.   On: 08/27/2022 21:02       Assessment & Plan:  Routine general medical examination at a health care facility  Healthcare maintenance Assessment & Plan: Physical today 07/24/23.Marland Kitchen  Seeing oncology for gyn exams. Colonoscopy 03/2021 - internal hemorrhoids.  recomemnded f/u in 5 years.  Mammogram - diagnostic mammogram 08/09/22 - birads I. Schedule f/u mammogram.    Visit for screening mammogram -     3D Screening Mammogram, Left and Right; Future  Anemia, unspecified type Assessment & Plan: Follow cbc.    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  Followed by neurology.    B12 deficiency Assessment & Plan: Continue B12 injections.    Chronic low back pain, unspecified back pain laterality,  unspecified whether sciatica present Assessment & Plan: Followed by pain clinic. Just saw [facility]U.  Planning evaluation for spinal cord stimulator.    Depression, major, in remission [facility]) Assessment & Plan: Seeing psychiatry.  On zoloft.  Follow.  Appears to be relatively stable.    Gastroesophageal reflux disease, unspecified whether esophagitis present Assessment & Plan: Continue nexium.    Hidradenitis Assessment & Plan: Bactroban    Hypercholesteremia Assessment & Plan: Has been off statin medication.  Restarted - described intolerance.  Will stop statin.  Start repatha. Low cholesterol diet and exercise.  Follow lipid panel and liver function tests.     Hyperglycemia Assessment & Plan: Low carb diet and exercise.  Follow met b and a1c.    Other migraine without status migrainosus, not intractable Assessment & Plan: Seeing neurology for migraines - ubrelvy, aimovig and rizatriptan.   Last evaluated 11/25/22.  Taking  topamax bid.   Vulvar dysplasia Assessment & Plan: 12/17/22 - Dr Sonia Side - physical exam including pelvic exam, colposcopy, and biopsy.  aldara. Has f/u planned.    Dog bite of index finger, initial encounter Assessment & Plan: Superficial wound.  No increased surrounding erythema.  Treat with doxycycline as directed.  Up to date with tetanus.  Dog up to date with vaccines.  Follow closely.    Other orders -     Estradiol; Place 1 Applicatorful vaginally 2 (two) times a week.  Dispense: 42.5 g; Refill: 0 -     Repatha SureClick; Inject 140 mg into the skin every 14 (fourteen) days.  Dispense: 2 mL; Refill: 3 -     Doxycycline Hyclate; Take 1 tablet (100 mg total) by mouth 2 (two) times daily.  Dispense: 14 tablet; Refill: 0     Dale Forkland, MD

## 2023-07-24 NOTE — Assessment & Plan Note (Addendum)
Physical today 07/24/23.Marland Kitchen  Seeing oncology for gyn exams. Colonoscopy 03/2021 - internal hemorrhoids.  recomemnded f/u in 5 years.  Mammogram - diagnostic mammogram 08/09/22 - birads I. Schedule f/u mammogram.

## 2023-07-25 ENCOUNTER — Other Ambulatory Visit (HOSPITAL_COMMUNITY): Payer: Self-pay

## 2023-07-25 ENCOUNTER — Telehealth: Payer: Self-pay

## 2023-07-25 NOTE — Telephone Encounter (Signed)
Pharmacy Patient Advocate Encounter   Received notification from CoverMyMeds that prior authorization for REPATHA 140MG /ML is required/requested.   Insurance verification completed.   The patient is insured through [place] .   Per test claim: PA required; PA submitted to [facility] via CoverMyMeds Key/confirmation #/EOC [id number] Status is pending   CLINICAL QUESTIONS POPUALTED AND ANSWERED

## 2023-07-25 NOTE — Telephone Encounter (Signed)
Pharmacy Patient Advocate Encounter  Received notification from [facility] that Prior Authorization for REPATHA SURECLICK 140MG /ML has been APPROVED from 11/18/22 to 11/18/23   PA #/Case ID/Reference #: [id number]

## 2023-07-26 ENCOUNTER — Encounter: Payer: Self-pay | Admitting: Internal Medicine

## 2023-07-26 DIAGNOSIS — S61258A Open bite of other finger without damage to nail, initial encounter: Secondary | ICD-10-CM | POA: Insufficient documentation

## 2023-07-26 DIAGNOSIS — W540XXA Bitten by dog, initial encounter: Secondary | ICD-10-CM | POA: Insufficient documentation

## 2023-07-26 NOTE — Assessment & Plan Note (Signed)
Seeing neurology for migraines - ubrelvy, aimovig and rizatriptan.   Last evaluated 11/25/22.  Taking topamax bid.

## 2023-07-26 NOTE — Assessment & Plan Note (Signed)
Superficial wound.  No increased surrounding erythema.  Treat with doxycycline as directed.  Up to date with tetanus.  Dog up to date with vaccines.  Follow closely.

## 2023-07-26 NOTE — Assessment & Plan Note (Addendum)
Has been off statin medication.  Restarted - described intolerance.  Will stop statin.  Start repatha. Low cholesterol diet and exercise.  Follow lipid panel and liver function tests.

## 2023-07-26 NOTE — Assessment & Plan Note (Signed)
S/p surgery.  Followed by neurology.  

## 2023-07-26 NOTE — Assessment & Plan Note (Signed)
Continue B12 injections.   

## 2023-07-26 NOTE — Assessment & Plan Note (Signed)
Low carb diet and exercise.  Follow met b and a1c.   

## 2023-07-26 NOTE — Assessment & Plan Note (Signed)
Bactroban 

## 2023-07-26 NOTE — Assessment & Plan Note (Signed)
Followed by pain clinic. Just saw [facility].  Planning evaluation for spinal cord stimulator.

## 2023-07-26 NOTE — Assessment & Plan Note (Signed)
Seeing psychiatry.  On zoloft.  Follow.  Appears to be relatively stable.

## 2023-07-26 NOTE — Assessment & Plan Note (Signed)
12/17/22 - Dr Sonia Side - physical exam including pelvic exam, colposcopy, and biopsy.  aldara. Has f/u planned.

## 2023-07-26 NOTE — Assessment & Plan Note (Signed)
Follow cbc.  

## 2023-07-26 NOTE — Assessment & Plan Note (Signed)
Continue nexium 

## 2023-07-28 ENCOUNTER — Ambulatory Visit (INDEPENDENT_AMBULATORY_CARE_PROVIDER_SITE_OTHER): Payer: Medicare PPO | Admitting: Family Medicine

## 2023-07-28 ENCOUNTER — Encounter (INDEPENDENT_AMBULATORY_CARE_PROVIDER_SITE_OTHER): Payer: Self-pay | Admitting: Family Medicine

## 2023-07-28 VITALS — BP 111/73 | HR 89 | Temp 98.4°F | Ht 69.5 in | Wt 213.0 lb

## 2023-07-28 DIAGNOSIS — Z6831 Body mass index (BMI) 31.0-31.9, adult: Secondary | ICD-10-CM | POA: Diagnosis not present

## 2023-07-28 DIAGNOSIS — E559 Vitamin D deficiency, unspecified: Secondary | ICD-10-CM

## 2023-07-28 DIAGNOSIS — E782 Mixed hyperlipidemia: Secondary | ICD-10-CM

## 2023-07-28 DIAGNOSIS — E669 Obesity, unspecified: Secondary | ICD-10-CM | POA: Diagnosis not present

## 2023-07-28 DIAGNOSIS — E88819 Insulin resistance, unspecified: Secondary | ICD-10-CM | POA: Diagnosis not present

## 2023-07-28 DIAGNOSIS — M545 Low back pain, unspecified: Secondary | ICD-10-CM

## 2023-07-28 DIAGNOSIS — G8929 Other chronic pain: Secondary | ICD-10-CM

## 2023-07-28 DIAGNOSIS — E785 Hyperlipidemia, unspecified: Secondary | ICD-10-CM | POA: Diagnosis not present

## 2023-07-28 DIAGNOSIS — Z789 Other specified health status: Secondary | ICD-10-CM | POA: Diagnosis not present

## 2023-07-28 DIAGNOSIS — R0609 Other forms of dyspnea: Secondary | ICD-10-CM

## 2023-07-28 MED ORDER — METFORMIN HCL 500 MG PO TABS: 500.0000 mg | ORAL_TABLET | Freq: Every day | ORAL | 0 refills | Status: DC

## 2023-07-28 MED ORDER — VITAMIN D (ERGOCALCIFEROL) 1.25 MG (50000 UNIT) PO CAPS
50000.0000 [IU] | ORAL_CAPSULE | ORAL | 0 refills | Status: DC
Start: 2023-07-28 — End: 2023-08-25

## 2023-07-28 NOTE — Addendum Note (Signed)
Addended by: Kirke Corin A on: 07/28/2023 03:16 PM   Modules accepted: Orders

## 2023-07-28 NOTE — Progress Notes (Signed)
.[facility]  Office: [phone]  /  Fax: [phone]  WEIGHT SUMMARY AND BIOMETRICS  Anthropometric Measurements Height: 5' 9.5" (1.765 m) Weight: 213 lb (96.6 kg) BMI (Calculated): 31.01 Weight at Last Visit: 219 lb Weight Lost Since Last Visit: 6 lb Weight Gained Since Last Visit: 0 Starting Weight: 219 lb Peak Weight: 320 lb   Body Composition  Body Fat %: 41.2 % Fat Mass (lbs): 88 lbs Muscle Mass (lbs): 119 lbs Total Body Water (lbs): 76.6 lbs Visceral Fat Rating : 10   Other Clinical Data Fasting: no Labs: no Today's Visit #: 2 Starting Date: 07/14/23    Chief Complaint: OBESITY   Discussed the use of AI scribe software for clinical note transcription with the patient, who gave verbal consent to proceed.  History of Present Illness   The patient is a 52 year old individual who presented for a follow-up visit to discuss lab results and progress in weight loss. She has a BMI of 31 and has lost six pounds in the last two weeks. The patient has been following a specific diet plan about 25% of the time.  The patient has been on Zoloft and Topamax. She reported difficulty remembering to take their evening medications.   The patient's LDL cholesterol level was found to be elevated at 188, and their triglycerides have also increased. Her HDL cholesterol is slightly below the target at 47. The patient has a family history of arterial blockage. She has a history of statin intolerance, which manifested as leg cramps and muscle pain, particularly noticeable during a vacation.  The patient also reported chronic back pain, which has been managed with steroid injections. She has had nerve ablation and is considering a trial for a nerve stimulator. The back pain has been a significant hindrance to her quality of life and has caused considerable stress.  The patient's insulin levels were found to be elevated at 14.5, indicating insulin resistance. This has likely contributed to her  difficulty in losing weight. The patient was previously on Ozempic, which likely helped manage her insulin levels.  The patient's exercise regimen has not been modified at this time due to her ongoing back pain.   She notes shortness of breath with even moderate to low levels of exercise. There is no Echo in our system.          PHYSICAL EXAM:  Blood pressure 111/73, pulse 89, temperature 98.4 F (36.9 C), height 5' 9.5" (1.765 m), weight 213 lb (96.6 kg), last menstrual period 12/08/2006, SpO2 99%. Body mass index is 31 kg/m.  DIAGNOSTIC DATA REVIEWED:  BMET    Component Value Date/Time   NA 142 07/14/2023 0952   NA 144 03/21/2012 0649   K 4.8 07/14/2023 0952   K 3.4 (L) 03/21/2012 0649   CL 105 07/14/2023 0952   CL 109 (H) 03/21/2012 0649   CO2 27 07/14/2023 0952   CO2 27 03/21/2012 0649   GLUCOSE 75 07/14/2023 0952   GLUCOSE 74 10/22/2022 0813   GLUCOSE 95 03/21/2012 0649   BUN 13 07/14/2023 0952   BUN 3 (L) 03/21/2012 0649   CREATININE 0.80 07/14/2023 0952   CREATININE 0.76 03/21/2012 0649   CALCIUM 9.5 07/14/2023 0952   CALCIUM 8.2 (L) 03/21/2012 0649   GFRNONAA >60 05/11/2020 1626   GFRNONAA >60 03/21/2012 0649   GFRAA >60 05/11/2020 1626   GFRAA >60 03/21/2012 0649   Lab Results  Component Value Date   HGBA1C 5.1 07/14/2023   HGBA1C 5.7 06/18/2018   Lab  Results  Component Value Date   INSULIN 14.5 07/14/2023   Lab Results  Component Value Date   TSH 2.630 07/14/2023   CBC    Component Value Date/Time   WBC 4.9 07/14/2023 0952   WBC 5.6 07/19/2022 0956   RBC 4.56 07/14/2023 0952   RBC 4.45 07/19/2022 0956   HGB 13.7 07/14/2023 0952   HCT 40.8 07/14/2023 0952   PLT 204 07/14/2023 0952   MCV 90 07/14/2023 0952   MCV 89 03/21/2012 0649   MCH 30.0 07/14/2023 0952   MCH 28.3 05/11/2020 1626   MCHC 33.6 07/14/2023 0952   MCHC 34.2 07/19/2022 0956   RDW 12.5 07/14/2023 0952   RDW 14.6 (H) 03/21/2012 0649   Iron Studies    Component Value  Date/Time   IRON 58 06/26/2020 0855   FERRITIN 46.6 06/26/2020 0855   IRONPCTSAT 15.1 (L) 06/26/2020 0855   Lipid Panel     Component Value Date/Time   CHOL 278 (H) 07/14/2023 0952   CHOL 235 (H) 03/19/2012 0445   TRIG 227 (H) 07/14/2023 0952   TRIG 258 (H) 03/19/2012 0445   HDL 47 07/14/2023 0952   HDL 35 (L) 03/19/2012 0445   CHOLHDL 5 10/22/2022 0813   VLDL 33.6 10/22/2022 0813   VLDL 52 (H) 03/19/2012 0445   LDLCALC 188 (H) 07/14/2023 0952   LDLCALC 148 (H) 03/19/2012 0445   LDLDIRECT 181.0 06/26/2020 0855   Hepatic Function Panel     Component Value Date/Time   PROT 6.8 07/14/2023 0952   PROT 6.8 03/19/2012 0445   ALBUMIN 4.5 07/14/2023 0952   ALBUMIN 3.3 (L) 03/19/2012 0445   AST 17 07/14/2023 0952   AST 24 03/19/2012 0445   ALT 17 07/14/2023 0952   ALT 28 03/19/2012 0445   ALKPHOS 83 07/14/2023 0952   ALKPHOS 102 03/19/2012 0445   BILITOT 0.2 07/14/2023 0952   BILITOT 0.6 03/19/2012 0445   BILIDIR 0.0 10/22/2022 0813      Component Value Date/Time   TSH 2.630 07/14/2023 0952   Nutritional Lab Results  Component Value Date   VD25OH 33.7 07/14/2023   VD25OH 23.27 (L) 06/18/2018   VD25OH 28.42 (L) 08/01/2016     Assessment and Plan    Hyperlipidemia LDL elevated at 188, despite statin intolerance. Discussed the importance of diet and the potential benefit of Repatha. -Contact primary care provider or pharmacy regarding Repatha prior authorization.  Insulin Resistance Elevated fasting insulin level indicating insulin resistance. Discussed the role of diet in depth and the potential benefit of Metformin. -Start Metformin 500mg  daily with breakfast to address insulin resistance.  Vitamin D Deficiency Vitamin D level at 33, below optimal range. Discussed the importance of supplementation. -Start Vitamin D 50,000 IU weekly for 3 months, then recheck levels.  Obesity BMI of 31 with recent weight loss of 6 pounds. Discussed the importance of diet,  particularly protein intake, and the role of exercise. -Continue current diet plan, aiming for 80g or more of protein daily. -Consider light exercise as tolerated, but not required at this point.  Chronic Back Pain Discussed impact on quality of life and potential interventions. -Continue current management plan and consider nerve stimulator trial if approved by therapist and insurance.   Exercise intolerance -will order an echocardiogram to look for signs of LVH or diastolic dysfunction.  Follow-up in October to review progress and adjust treatment plan as necessary.         I have personally spent 50 minutes total time today  in preparation, patient care, and documentation for this visit, including the following: review of clinical lab tests; review of medical tests/procedures/services.    She was informed of the importance of frequent follow up visits to maximize her success with intensive lifestyle modifications for her multiple health conditions.    Quillian Quince, MD

## 2023-07-29 ENCOUNTER — Encounter (HOSPITAL_COMMUNITY): Payer: Self-pay | Admitting: Family Medicine

## 2023-07-29 DIAGNOSIS — G894 Chronic pain syndrome: Secondary | ICD-10-CM | POA: Diagnosis not present

## 2023-08-12 ENCOUNTER — Encounter (INDEPENDENT_AMBULATORY_CARE_PROVIDER_SITE_OTHER): Payer: Self-pay | Admitting: Family Medicine

## 2023-08-12 ENCOUNTER — Ambulatory Visit (INDEPENDENT_AMBULATORY_CARE_PROVIDER_SITE_OTHER): Payer: Medicare PPO | Admitting: Family Medicine

## 2023-08-12 VITALS — BP 115/77 | HR 67 | Temp 98.4°F | Ht 69.5 in | Wt 216.0 lb

## 2023-08-12 DIAGNOSIS — E669 Obesity, unspecified: Secondary | ICD-10-CM | POA: Diagnosis not present

## 2023-08-12 DIAGNOSIS — E559 Vitamin D deficiency, unspecified: Secondary | ICD-10-CM | POA: Diagnosis not present

## 2023-08-12 DIAGNOSIS — Z6831 Body mass index (BMI) 31.0-31.9, adult: Secondary | ICD-10-CM

## 2023-08-12 DIAGNOSIS — E88819 Insulin resistance, unspecified: Secondary | ICD-10-CM | POA: Diagnosis not present

## 2023-08-12 MED ORDER — METFORMIN HCL 500 MG PO TABS
500.0000 mg | ORAL_TABLET | Freq: Every day | ORAL | 0 refills | Status: DC
Start: 2023-08-12 — End: 2023-08-25

## 2023-08-13 NOTE — Progress Notes (Signed)
Chief Complaint:   OBESITY Kathleen Everett is here to discuss her progress with her obesity treatment plan along with follow-up of her obesity related diagnoses. Kathleen Everett is on the Category 2 Plan + 100 calories and states she is following her eating plan approximately 0% of the time. Kathleen Everett states she is doing 0 minutes 0 times per week.  Today's visit was #: 3 Starting weight: 219 lbs Starting date: 07/14/2023 Today's weight: 216 lbs Today's date: 08/12/2023 Total lbs lost to date: 3 Total lbs lost since last in-office visit: 0  Interim History: Patient has been mindful of her eating and journaled a few days.  She is being especially mindful of her finances.  Subjective:   1. Insulin resistance Patient is working on her diet, and no side effects were noted on metformin.  She had some mild GI upset but this has resolved.  2. Vitamin D deficiency Patient is on vitamin D prescription, but her vitamin D level is not yet at goal.  No side effects were noted.  Assessment/Plan:   1. Insulin resistance Patient will continue metformin 500 mg every morning, and we will refill for 1 month.  - metFORMIN (GLUCOPHAGE) 500 MG tablet; Take 1 tablet (500 mg total) by mouth daily with breakfast.  Dispense: 30 tablet; Refill: 0  2. Vitamin D deficiency Patient will continue vitamin D prescription, and we will recheck labs in 2 months.  3. BMI 31.0-31.9,adult  4. Obesity, Beginning BMI 31.9 Kathleen Everett is currently in the action stage of change. As such, her goal is to continue with weight loss efforts. She has agreed to keeping a food journal and adhering to recommended goals of 1400-1500 calories and 80 grams of protein daily.   Exercise goals: All adults should avoid inactivity. Some physical activity is better than none, and adults who participate in any amount of physical activity gain some health benefits.  Behavioral modification strategies: increasing lean protein intake and keeping a strict food  journal.  Kathleen Everett has agreed to follow-up with our clinic in 2 to 3 weeks. She was informed of the importance of frequent follow-up visits to maximize her success with intensive lifestyle modifications for her multiple health conditions.   Objective:   Blood pressure 115/77, pulse 67, temperature 98.4 F (36.9 C), height 5' 9.5" (1.765 m), weight 216 lb (98 kg), last menstrual period 12/08/2006, SpO2 98%. Body mass index is 31.44 kg/m.  Lab Results  Component Value Date   CREATININE 0.80 07/14/2023   BUN 13 07/14/2023   NA 142 07/14/2023   K 4.8 07/14/2023   CL 105 07/14/2023   CO2 27 07/14/2023   Lab Results  Component Value Date   ALT 17 07/14/2023   AST 17 07/14/2023   ALKPHOS 83 07/14/2023   BILITOT 0.2 07/14/2023   Lab Results  Component Value Date   HGBA1C 5.1 07/14/2023   HGBA1C 5.3 10/22/2022   HGBA1C 5.4 07/19/2022   HGBA1C 5.6 03/06/2022   HGBA1C 5.5 07/17/2021   Lab Results  Component Value Date   INSULIN 14.5 07/14/2023   Lab Results  Component Value Date   TSH 2.630 07/14/2023   Lab Results  Component Value Date   CHOL 278 (H) 07/14/2023   HDL 47 07/14/2023   LDLCALC 188 (H) 07/14/2023   LDLDIRECT 181.0 06/26/2020   TRIG 227 (H) 07/14/2023   CHOLHDL 5 10/22/2022   Lab Results  Component Value Date   VD25OH 33.7 07/14/2023   VD25OH 23.27 (L) 06/18/2018  VD25OH 28.42 (L) 08/01/2016   Lab Results  Component Value Date   WBC 4.9 07/14/2023   HGB 13.7 07/14/2023   HCT 40.8 07/14/2023   MCV 90 07/14/2023   PLT 204 07/14/2023   Lab Results  Component Value Date   IRON 58 06/26/2020   FERRITIN 46.6 06/26/2020   Attestation Statements:   Reviewed by clinician on day of visit: allergies, medications, problem list, medical history, surgical history, family history, social history, and previous encounter notes.   I, Burt Knack, am acting as transcriptionist for Quillian Quince, MD.  I have reviewed the above documentation for accuracy and  completeness, and I agree with the above. -  Quillian Quince, MD

## 2023-08-14 DIAGNOSIS — G518 Other disorders of facial nerve: Secondary | ICD-10-CM | POA: Diagnosis not present

## 2023-08-14 DIAGNOSIS — M7911 Myalgia of mastication muscle: Secondary | ICD-10-CM | POA: Diagnosis not present

## 2023-08-14 DIAGNOSIS — M26633 Articular disc disorder of bilateral temporomandibular joint: Secondary | ICD-10-CM | POA: Diagnosis not present

## 2023-08-14 DIAGNOSIS — M26623 Arthralgia of bilateral temporomandibular joint: Secondary | ICD-10-CM | POA: Diagnosis not present

## 2023-08-19 ENCOUNTER — Ambulatory Visit: Payer: Medicare PPO | Admitting: Psychiatry

## 2023-08-19 DIAGNOSIS — M51379 Other intervertebral disc degeneration, lumbosacral region without mention of lumbar back pain or lower extremity pain: Secondary | ICD-10-CM | POA: Diagnosis not present

## 2023-08-19 DIAGNOSIS — M5442 Lumbago with sciatica, left side: Secondary | ICD-10-CM | POA: Diagnosis not present

## 2023-08-19 DIAGNOSIS — M1611 Unilateral primary osteoarthritis, right hip: Secondary | ICD-10-CM | POA: Diagnosis not present

## 2023-08-19 DIAGNOSIS — M533 Sacrococcygeal disorders, not elsewhere classified: Secondary | ICD-10-CM | POA: Diagnosis not present

## 2023-08-19 DIAGNOSIS — M47816 Spondylosis without myelopathy or radiculopathy, lumbar region: Secondary | ICD-10-CM | POA: Diagnosis not present

## 2023-08-19 DIAGNOSIS — M5412 Radiculopathy, cervical region: Secondary | ICD-10-CM | POA: Diagnosis not present

## 2023-08-20 ENCOUNTER — Inpatient Hospital Stay: Payer: Medicare PPO

## 2023-08-21 ENCOUNTER — Other Ambulatory Visit (INDEPENDENT_AMBULATORY_CARE_PROVIDER_SITE_OTHER): Payer: Self-pay | Admitting: Family Medicine

## 2023-08-21 DIAGNOSIS — E559 Vitamin D deficiency, unspecified: Secondary | ICD-10-CM
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
## 2023-08-25 ENCOUNTER — Ambulatory Visit (HOSPITAL_COMMUNITY): Payer: Medicare PPO | Attending: Family Medicine

## 2023-08-25 ENCOUNTER — Encounter (INDEPENDENT_AMBULATORY_CARE_PROVIDER_SITE_OTHER): Payer: Self-pay | Admitting: Family Medicine

## 2023-08-25 ENCOUNTER — Ambulatory Visit (INDEPENDENT_AMBULATORY_CARE_PROVIDER_SITE_OTHER): Payer: Medicare PPO | Admitting: Family Medicine

## 2023-08-25 ENCOUNTER — Encounter: Payer: Self-pay | Admitting: Internal Medicine

## 2023-08-25 VITALS — BP 115/78 | HR 70 | Temp 98.1°F | Ht 69.5 in | Wt 211.0 lb

## 2023-08-25 DIAGNOSIS — E88819 Insulin resistance, unspecified: Secondary | ICD-10-CM

## 2023-08-25 DIAGNOSIS — E782 Mixed hyperlipidemia: Secondary | ICD-10-CM | POA: Diagnosis not present

## 2023-08-25 DIAGNOSIS — K5909 Other constipation: Secondary | ICD-10-CM

## 2023-08-25 DIAGNOSIS — E559 Vitamin D deficiency, unspecified: Secondary | ICD-10-CM | POA: Diagnosis not present

## 2023-08-25 DIAGNOSIS — Z6830 Body mass index (BMI) 30.0-30.9, adult: Secondary | ICD-10-CM

## 2023-08-25 DIAGNOSIS — R0609 Other forms of dyspnea: Secondary | ICD-10-CM | POA: Diagnosis not present

## 2023-08-25 DIAGNOSIS — M791 Myalgia, unspecified site: Secondary | ICD-10-CM

## 2023-08-25 DIAGNOSIS — E669 Obesity, unspecified: Secondary | ICD-10-CM | POA: Diagnosis not present

## 2023-08-25 LAB — ECHOCARDIOGRAM COMPLETE
Area-P 1/2: 2.73 cm2
Height: 69.5 in
S' Lateral: 3.1 cm
Weight: 3376 [oz_av]

## 2023-08-25 MED ORDER — METFORMIN HCL 500 MG PO TABS
500.0000 mg | ORAL_TABLET | Freq: Every day | ORAL | 0 refills | Status: DC
Start: 2023-08-25 — End: 2023-09-09

## 2023-08-25 MED ORDER — VITAMIN D (ERGOCALCIFEROL) 1.25 MG (50000 UNIT) PO CAPS
50000.0000 [IU] | ORAL_CAPSULE | ORAL | 0 refills | Status: DC
Start: 2023-08-25 — End: 2023-09-09

## 2023-08-25 NOTE — Progress Notes (Signed)
Chief Complaint:   OBESITY Kathleen Everett is here to discuss her progress with her obesity treatment plan along with follow-up of her obesity related diagnoses. Kathleen Everett is on keeping a food journal and adhering to recommended goals of 1400-1500 calories and 80 grams of protein and states she is following her eating plan approximately 50% of the time. Kathleen Everett states she is doing 0 minutes 0 times per week.  Today's visit was #: 4 Starting weight: 219 lbs Starting date: 07/14/2023 Today's weight: 211 lbs Today's date: 08/25/2023 Total lbs lost to date: 8 Total lbs lost since last in-office visit: 5  Interim History: Patient continues to do well with her weight loss.  She is working on meal planning and she is doing better overall.  Her weight loss is appropriate.  Subjective:   1. Other constipation Patient notes less frequent BMs and harder BMs.  She has taken MiraLAX.  2. Vitamin D deficiency Patient is on vitamin D, and she requests a refill.  3. Insulin resistance Patient is on metformin, and she is working on her diet and weight loss.  4. Mixed hyperlipidemia Patient is working on her diet to improve her cholesterol levels.  She is on Repatha but had side effects.  Assessment/Plan:   1. Other constipation Patient is to continue MiraLAX and Benefiber but increase her water intake.  2. Vitamin D deficiency Patient will continue prescription vitamin D once weekly, and we will refill for 1 month.  - Vitamin D, Ergocalciferol, (DRISDOL) 1.25 MG (50000 UNIT) CAPS capsule; Take 1 capsule (50,000 Units total) by mouth every 7 (seven) days.  Dispense: 4 capsule; Refill: 0  3. Insulin resistance Patient will continue metformin 500 mg every morning, and we will refill for 1 month.  - metFORMIN (GLUCOPHAGE) 500 MG tablet; Take 1 tablet (500 mg total) by mouth daily with breakfast.  Dispense: 30 tablet; Refill: 0  4. Mixed hyperlipidemia Patient will continue with her diet and exercise as  discussed.  She was advised to contact her PCP about medication adjustments.  5. BMI 30.0-30.9,adult  6. Obesity, Beginning BMI 31.9 Kathleen Everett is currently in the action stage of change. As such, her goal is to continue with weight loss efforts. She has agreed to the Category 2 Plan + 100 calories.   Exercise goals: All adults should avoid inactivity. Some physical activity is better than none, and adults who participate in any amount of physical activity gain some health benefits.  Behavioral modification strategies: increasing lean protein intake and meal planning and cooking strategies.  Kathleen Everett has agreed to follow-up with our clinic in 2 to 3 weeks. She was informed of the importance of frequent follow-up visits to maximize her success with intensive lifestyle modifications for her multiple health conditions.   Objective:   Blood pressure 115/78, pulse 70, temperature 98.1 F (36.7 C), height 5' 9.5" (1.765 m), weight 211 lb (95.7 kg), last menstrual period 12/08/2006, SpO2 100%. Body mass index is 30.71 kg/m.  Lab Results  Component Value Date   CREATININE 0.80 07/14/2023   BUN 13 07/14/2023   NA 142 07/14/2023   K 4.8 07/14/2023   CL 105 07/14/2023   CO2 27 07/14/2023   Lab Results  Component Value Date   ALT 17 07/14/2023   AST 17 07/14/2023   ALKPHOS 83 07/14/2023   BILITOT 0.2 07/14/2023   Lab Results  Component Value Date   HGBA1C 5.1 07/14/2023   HGBA1C 5.3 10/22/2022   HGBA1C 5.4 07/19/2022  HGBA1C 5.6 03/06/2022   HGBA1C 5.5 07/17/2021   Lab Results  Component Value Date   INSULIN 14.5 07/14/2023   Lab Results  Component Value Date   TSH 2.630 07/14/2023   Lab Results  Component Value Date   CHOL 278 (H) 07/14/2023   HDL 47 07/14/2023   LDLCALC 188 (H) 07/14/2023   LDLDIRECT 181.0 06/26/2020   TRIG 227 (H) 07/14/2023   CHOLHDL 5 10/22/2022   Lab Results  Component Value Date   VD25OH 33.7 07/14/2023   VD25OH 23.27 (L) 06/18/2018   VD25OH 28.42  (L) 08/01/2016   Lab Results  Component Value Date   WBC 4.9 07/14/2023   HGB 13.7 07/14/2023   HCT 40.8 07/14/2023   MCV 90 07/14/2023   PLT 204 07/14/2023   Lab Results  Component Value Date   IRON 58 06/26/2020   FERRITIN 46.6 06/26/2020   Attestation Statements:   Reviewed by clinician on day of visit: allergies, medications, problem list, medical history, surgical history, family history, social history, and previous encounter notes.   I, Burt Knack, am acting as transcriptionist for Quillian Quince, MD.  I have reviewed the above documentation for accuracy and completeness, and I agree with the above. -  Quillian Quince, MD

## 2023-08-26 ENCOUNTER — Ambulatory Visit (INDEPENDENT_AMBULATORY_CARE_PROVIDER_SITE_OTHER): Payer: Medicare PPO | Admitting: Dermatology

## 2023-08-26 ENCOUNTER — Encounter: Payer: Self-pay | Admitting: Dermatology

## 2023-08-26 VITALS — BP 128/68 | HR 78

## 2023-08-26 DIAGNOSIS — D229 Melanocytic nevi, unspecified: Secondary | ICD-10-CM

## 2023-08-26 DIAGNOSIS — D1801 Hemangioma of skin and subcutaneous tissue: Secondary | ICD-10-CM | POA: Diagnosis not present

## 2023-08-26 DIAGNOSIS — L814 Other melanin hyperpigmentation: Secondary | ICD-10-CM | POA: Diagnosis not present

## 2023-08-26 DIAGNOSIS — Q828 Other specified congenital malformations of skin: Secondary | ICD-10-CM

## 2023-08-26 DIAGNOSIS — Z86018 Personal history of other benign neoplasm: Secondary | ICD-10-CM

## 2023-08-26 DIAGNOSIS — Z1283 Encounter for screening for malignant neoplasm of skin: Secondary | ICD-10-CM | POA: Diagnosis not present

## 2023-08-26 DIAGNOSIS — L570 Actinic keratosis: Secondary | ICD-10-CM

## 2023-08-26 DIAGNOSIS — L309 Dermatitis, unspecified: Secondary | ICD-10-CM | POA: Diagnosis not present

## 2023-08-26 DIAGNOSIS — W908XXA Exposure to other nonionizing radiation, initial encounter: Secondary | ICD-10-CM

## 2023-08-26 DIAGNOSIS — L280 Lichen simplex chronicus: Secondary | ICD-10-CM | POA: Diagnosis not present

## 2023-08-26 DIAGNOSIS — L821 Other seborrheic keratosis: Secondary | ICD-10-CM

## 2023-08-26 DIAGNOSIS — L818 Other specified disorders of pigmentation: Secondary | ICD-10-CM

## 2023-08-26 DIAGNOSIS — L578 Other skin changes due to chronic exposure to nonionizing radiation: Secondary | ICD-10-CM | POA: Diagnosis not present

## 2023-08-26 MED ORDER — EUCRISA 2 % EX OINT: TOPICAL_OINTMENT | CUTANEOUS | 2 refills | Status: DC

## 2023-08-26 NOTE — Telephone Encounter (Signed)
She has a history of chronic pain.  Are these symptoms different from her chronic pain.  If increased pain with injection, would recommend holding further injections.  Recommend low cholesterol diet and exercise. Will follow cholesterol.

## 2023-08-26 NOTE — Patient Instructions (Addendum)
For itchy dry thickened skin on feet: Recommend using moisturizer with salicylic acid or urea cream. CeraVe SA samples given. CeraVe itch relief. AmLactin samples given.   Recommend daily broad spectrum sunscreen SPF 30+ to sun-exposed areas, reapply every 2 hours as needed. Call for new or changing lesions.  Staying in the shade or wearing long sleeves, sun glasses (UVA+UVB protection) and wide brim hats (4-inch brim around the entire circumference of the hat) are also recommended for sun protection.     Melanoma ABCDEs  Melanoma is the most dangerous type of skin cancer, and is the leading cause of death from skin disease.  You are more likely to develop melanoma if you: Have light-colored skin, light-colored eyes, or red or blond hair Spend a lot of time in the sun Tan regularly, either outdoors or in a tanning bed Have had blistering sunburns, especially during childhood Have a close family member who has had a melanoma Have atypical moles or large birthmarks  Early detection of melanoma is key since treatment is typically straightforward and cure rates are extremely high if we catch it early.   The first sign of melanoma is often a change in a mole or a new dark spot.  The ABCDE system is a way of remembering the signs of melanoma.  A for asymmetry:  The two halves do not match. B for border:  The edges of the growth are irregular. C for color:  A mixture of colors are present instead of an even brown color. D for diameter:  Melanomas are usually (but not always) greater than 6mm - the size of a pencil eraser. E for evolution:  The spot keeps changing in size, shape, and color.  Please check your skin once per month between visits. You can use a small mirror in front and a large mirror behind you to keep an eye on the back side or your body.   If you see any new or changing lesions before your next follow-up, please call to schedule a visit.  Please continue daily skin  protection including broad spectrum sunscreen SPF 30+ to sun-exposed areas, reapplying every 2 hours as needed when you're outdoors.   Staying in the shade or wearing long sleeves, sun glasses (UVA+UVB protection) and wide brim hats (4-inch brim around the entire circumference of the hat) are also recommended for sun protection.     Due to recent changes in healthcare laws, you may see results of your pathology and/or laboratory studies on MyChart before the doctors have had a chance to review them. We understand that in some cases there may be results that are confusing or concerning to you. Please understand that not all results are received at the same time and often the doctors may need to interpret multiple results in order to provide you with the best plan of care or course of treatment. Therefore, we ask that you please give [place] 2 business days to thoroughly review all your results before contacting the office for clarification. Should we see a critical lab result, you will be contacted sooner.   If You Need Anything After Your Visit  If you have any questions or concerns for your doctor, please call our main line at [phone] and press option 4 to reach your doctor's medical assistant. If no one answers, please leave a voicemail as directed and we will return your call as soon as possible. Messages left after 4 pm will be answered the following business day.  You may also send [place] a message via MyChart. We typically respond to MyChart messages within 1-2 business days.  For prescription refills, please ask your pharmacy to contact our office. Our fax number is [phone].  If you have an urgent issue when the clinic is closed that cannot wait until the next business day, you can page your doctor at the number below.    Please note that while we do our best to be available for urgent issues outside of office hours, we are not available 24/7.   If you have an urgent issue and are unable  to reach [place], you may choose to seek medical care at your doctor's office, retail clinic, urgent care center, or emergency room.  If you have a medical emergency, please immediately call 911 or go to the emergency department.  Pager Numbers  - Dr. Gwen Pounds: [phone]  - Dr. Roseanne Reno: [phone]  - Dr. Katrinka Blazing: [phone]   In the event of inclement weather, please call our main line at [phone] for an update on the status of any delays or closures.  Dermatology Medication Tips: Please keep the boxes that topical medications come in in order to help keep track of the instructions about where and how to use these. Pharmacies typically print the medication instructions only on the boxes and not directly on the medication tubes.   If your medication is too expensive, please contact our office at [phone] option 4 or send [place] a message through MyChart.   We are unable to tell what your co-pay for medications will be in advance as this is different depending on your insurance coverage. However, we may be able to find a substitute medication at lower cost or fill out paperwork to get insurance to cover a needed medication.   If a prior authorization is required to get your medication covered by your insurance company, please allow [place] 1-2 business days to complete this process.  Drug prices often vary depending on where the prescription is filled and some pharmacies may offer cheaper prices.  The website www.goodrx.com contains coupons for medications through different pharmacies. The prices here do not account for what the cost may be with help from insurance (it may be cheaper with your insurance), but the website can give you the price if you did not use any insurance.  - You can print the associated coupon and take it with your prescription to the pharmacy.  - You may also stop by our office during regular business hours and pick up a GoodRx coupon card.  - If you need your  prescription sent electronically to a different pharmacy, notify our office through [facility] or by phone at [phone] option 4.     Si Usted Necesita Algo Despus de Su Visita  Tambin puede enviarnos un mensaje a travs de Clinical cytogeneticist. Por lo general respondemos a los mensajes de MyChart en el transcurso de 1 a 2 das hbiles.  Para renovar recetas, por favor pida a su farmacia que se ponga en contacto con nuestra oficina. Annie Sable de fax es [place] [phone].  Si tiene un asunto urgente cuando la clnica est cerrada y que no puede esperar hasta el siguiente da hbil, puede llamar/localizar a su doctor(a) al nmero que aparece a continuacin.   Por favor, tenga en cuenta que aunque hacemos todo lo posible para estar disponibles para asuntos urgentes fuera del horario de [place], no estamos disponibles las 24 horas del da, los 7 [address]  [address] de la [place].  Si tiene un problema urgente y no puede comunicarse con nosotros, puede optar por buscar atencin mdica  en el consultorio de su doctor(a), en una clnica privada, en un centro de atencin urgente o en una sala de emergencias.  Si tiene Engineer, drilling, por favor llame inmediatamente al 911 o vaya a la sala de emergencias.  Nmeros de bper  - Dr. Gwen Pounds: [phone]  - Dra. Roseanne Reno: [phone]  - Dr. Katrinka Blazing: [phone]   En caso de inclemencias del tiempo, por favor llame a Lacy Duverney principal al [phone] para una actualizacin sobre el [place] de cualquier retraso o cierre.  Consejos para la medicacin en dermatologa: Por favor, guarde las cajas en las que vienen los medicamentos de uso tpico para ayudarle a seguir las instrucciones sobre dnde y cmo usarlos. Las farmacias generalmente imprimen las instrucciones del medicamento slo en las cajas y no directamente en los tubos del [place].   Si su medicamento es muy caro, por favor, pngase en contacto con Rolm Gala llamando al  [phone] y presione la opcin 4 o envenos un mensaje a travs de Clinical cytogeneticist.   No podemos decirle cul ser su copago por los medicamentos por adelantado ya que esto es diferente dependiendo de la cobertura de su seguro. Sin embargo, es posible que podamos encontrar un medicamento sustituto a Audiological scientist un formulario para que el seguro cubra el medicamento que se considera necesario.   Si se requiere una autorizacin previa para que su compaa de seguros [place] su medicamento, por favor permtanos de 1 a 2 das hbiles para completar [address]  Los precios de los medicamentos varan con frecuencia dependiendo del Environmental consultant de dnde se surte la receta y alguna farmacias pueden ofrecer precios ms baratos.  El sitio web www.goodrx.com tiene cupones para medicamentos de Health and safety inspector. Los precios aqu no tienen en cuenta lo que podra costar con la ayuda del seguro (puede ser ms barato con su seguro), pero el sitio web puede darle el precio si no utiliz Tourist information centre manager.  - Puede imprimir el cupn correspondiente y llevarlo con su receta a la farmacia.  - Tambin puede pasar por nuestra oficina durante el horario de atencin regular y Education officer, museum una tarjeta de cupones de GoodRx.  - Si necesita que su receta se enve electrnicamente a una farmacia diferente, informe a nuestra oficina a travs de MyChart de  o por telfono llamando al [phone] y presione la opcin 4.

## 2023-08-26 NOTE — Progress Notes (Signed)
Follow-Up Visit   Subjective  Kathleen Everett is a 52 y.o. female who presents for the following: Skin Cancer Screening and Full Body Skin Exam. Hx of dysplastic nevus. No personal hx of skin cancer. No family Hx of skin cancer.   The patient presents for Total-Body Skin Exam (TBSE) for skin cancer screening and mole check. The patient has spots, moles and lesions to be evaluated, some may be new or changing and the patient may have concern these could be cancer.    The following portions of the chart were reviewed this encounter and updated as appropriate: medications, allergies, medical history  Review of Systems:  No other skin or systemic complaints except as noted in HPI or Assessment and Plan.  Objective  Well appearing patient in no apparent distress; mood and affect are within normal limits.  A full examination was performed including scalp, head, eyes, ears, nose, lips, neck, chest, axillae, abdomen, back, buttocks, bilateral upper extremities, bilateral lower extremities, hands, feet, fingers, toes, fingernails, and toenails. All findings within normal limits unless otherwise noted below.   Relevant physical exam findings are noted in the Assessment and Plan.  R nasal dorsum x1, L nasal dorsum x1, left lateral forehead x1 (3) Erythematous thin papules/macules with gritty scale.   Exam of nails limited by presence of nail polish.   Assessment & Plan   HISTORY OF DYSPLASTIC NEVUS. R abdomen - moderate, limited margins free. 10/09/2015. No evidence of recurrence today Recommend regular full body skin exams Recommend daily broad spectrum sunscreen SPF 30+ to sun-exposed areas, reapply every 2 hours as needed.  Call if any new or changing lesions are noted between office visits   SKIN CANCER SCREENING PERFORMED TODAY.  ACTINIC DAMAGE - Chronic condition, secondary to cumulative UV/sun exposure - diffuse scaly erythematous macules with underlying  dyspigmentation - Recommend daily broad spectrum sunscreen SPF 30+ to sun-exposed areas, reapply every 2 hours as needed.  - Staying in the shade or wearing long sleeves, sun glasses (UVA+UVB protection) and wide brim hats (4-inch brim around the entire circumference of the hat) are also recommended for sun protection.  - Call for new or changing lesions. - Recommend wearing wide brim hat when outside.   LENTIGINES, SEBORRHEIC KERATOSES, HEMANGIOMAS - Benign normal skin lesions - Benign-appearing - Call for any changes  MELANOCYTIC NEVI - Tan-brown and/or pink-flesh-colored symmetric macules and papules - Benign appearing on exam today - Observation - Call clinic for new or changing moles - Recommend daily use of broad spectrum spf 30+ sunscreen to sun-exposed areas.  - Check nails when remove polish.   LSC on heels  Exam: hyperkeratosis at B/L plantar heels with some fissurign  Treatment:  Recommend using moisturizer with salicylic acid or urea cream. CeraVe SA samples given. CeraVe itch relief. AmLactin samples given.  HAND DERMATITIS Exam Mild xerosis on hands  Chronic condition with duration or expected duration over one year. Currently well-controlled.   Hand Dermatitis is a chronic type of eczema that can come and go on the hands and fingers.  While there is no cure, the rash and symptoms can be managed with topical prescription medications, and for more severe cases, with systemic medications.  Recommend mild soap and routine use of moisturizing cream after handwashing.  Minimize soap/water exposure when possible.    Treatment Plan Continue Eucrisa ointment twice daily.    Porokeratosis  Exam: pink slightly scaly patch at anterior left lower leg.  Treatment: The patient will observe  these symptoms, and report promptly any worsening or unexpected persistence.  If well, may return prn.  AK (actinic keratosis) (3) R nasal dorsum x1, L nasal dorsum x1, left lateral  forehead x1  Actinic keratoses are precancerous spots that appear secondary to cumulative UV radiation exposure/sun exposure over time. They are chronic with expected duration over 1 year. A portion of actinic keratoses will progress to squamous cell carcinoma of the skin. It is not possible to reliably predict which spots will progress to skin cancer and so treatment is recommended to prevent development of skin cancer.  Recommend daily broad spectrum sunscreen SPF 30+ to sun-exposed areas, reapply every 2 hours as needed.  Recommend staying in the shade or wearing long sleeves, sun glasses (UVA+UVB protection) and wide brim hats (4-inch brim around the entire circumference of the hat). Call for new or changing lesions.  Destruction of lesion - R nasal dorsum x1, L nasal dorsum x1, left lateral forehead x1 (3) Complexity: simple   Destruction method: cryotherapy   Informed consent: discussed and consent obtained   Timeout:  patient name, date of birth, surgical site, and procedure verified Lesion destroyed using liquid nitrogen: Yes   Region frozen until ice ball extended beyond lesion: Yes   Cryo cycles: 1 or 2. Outcome: patient tolerated procedure well with no complications   Post-procedure details: wound care instructions given   Additional details:  Prior to procedure, discussed risks of blister formation, small wound, skin dyspigmentation, or rare scar following cryotherapy. Recommend Vaseline ointment to treated areas while healing.   Multiple benign nevi  Lentigines  Seborrheic keratoses  Cherry angioma  Actinic elastosis  Idiopathic guttate hypomelanosis   Return in about 1 year (around 08/25/2024) for TBSE, HxDN.  I, Lawson Radar, CMA, am acting as scribe for Elie Goody, MD.   Documentation: I have reviewed the above documentation for accuracy and completeness, and I agree with the above.  Elie Goody, MD

## 2023-08-27 ENCOUNTER — Encounter: Payer: Medicare HMO | Admitting: Dermatology

## 2023-08-28 ENCOUNTER — Encounter: Payer: Self-pay | Admitting: Psychiatry

## 2023-08-28 ENCOUNTER — Telehealth: Payer: Medicare PPO | Admitting: Psychiatry

## 2023-08-28 DIAGNOSIS — F331 Major depressive disorder, recurrent, moderate: Secondary | ICD-10-CM | POA: Diagnosis not present

## 2023-08-28 DIAGNOSIS — F411 Generalized anxiety disorder: Secondary | ICD-10-CM | POA: Diagnosis not present

## 2023-08-28 MED ORDER — BUPROPION HCL ER (XL) 150 MG PO TB24: 150.0000 mg | ORAL_TABLET | Freq: Every day | ORAL | 1 refills | Status: DC

## 2023-08-28 MED ORDER — SERTRALINE HCL 100 MG PO TABS: 200.0000 mg | ORAL_TABLET | Freq: Every day | ORAL | 0 refills | Status: DC

## 2023-08-28 NOTE — Patient Instructions (Signed)
Continue sertraline 200 mg daily  Start bupropion 150 mg daily  Next appointment: 12/5 at 9 am

## 2023-09-01 NOTE — Telephone Encounter (Signed)
Reviewed message.  Given she has been off the medication now for a while, please confirm she is doing ok.  If still having cramps, I can check met b, magnesium and cbc with iron studies. Pending her symptoms, may need to check other blood test.

## 2023-09-02 NOTE — Telephone Encounter (Signed)
I have pended labs below, but you mentioned that you may need to check other blood test. Will schedule lab appt once orders are completed.

## 2023-09-02 NOTE — Telephone Encounter (Signed)
Orders placed.  Please schedule lab appt.  Thanks.

## 2023-09-09 ENCOUNTER — Ambulatory Visit (INDEPENDENT_AMBULATORY_CARE_PROVIDER_SITE_OTHER): Payer: Medicare PPO | Admitting: Family Medicine

## 2023-09-09 ENCOUNTER — Encounter (INDEPENDENT_AMBULATORY_CARE_PROVIDER_SITE_OTHER): Payer: Self-pay | Admitting: Family Medicine

## 2023-09-09 VITALS — BP 108/73 | HR 75 | Temp 98.1°F | Ht 69.5 in | Wt 212.0 lb

## 2023-09-09 DIAGNOSIS — E88819 Insulin resistance, unspecified: Secondary | ICD-10-CM

## 2023-09-09 DIAGNOSIS — K5900 Constipation, unspecified: Secondary | ICD-10-CM | POA: Diagnosis not present

## 2023-09-09 DIAGNOSIS — K5901 Slow transit constipation: Secondary | ICD-10-CM

## 2023-09-09 DIAGNOSIS — E559 Vitamin D deficiency, unspecified: Secondary | ICD-10-CM

## 2023-09-09 DIAGNOSIS — Z6830 Body mass index (BMI) 30.0-30.9, adult: Secondary | ICD-10-CM

## 2023-09-09 DIAGNOSIS — E669 Obesity, unspecified: Secondary | ICD-10-CM | POA: Diagnosis not present

## 2023-09-09 MED ORDER — METFORMIN HCL 500 MG PO TABS
500.0000 mg | ORAL_TABLET | Freq: Every day | ORAL | 0 refills | Status: DC
Start: 2023-09-09 — End: 2023-09-30

## 2023-09-09 MED ORDER — VITAMIN D (ERGOCALCIFEROL) 1.25 MG (50000 UNIT) PO CAPS
50000.0000 [IU] | ORAL_CAPSULE | ORAL | 0 refills | Status: DC
Start: 2023-09-09 — End: 2023-09-30

## 2023-09-09 NOTE — Progress Notes (Signed)
.smr  Office: [phone]  /  Fax: [phone]  WEIGHT SUMMARY AND BIOMETRICS  Anthropometric Measurements Height: 5' 9.5" (1.765 m) Weight: 212 lb (96.2 kg) BMI (Calculated): 30.87 Weight at Last Visit: 211 lb Weight Lost Since Last Visit: 0 Weight Gained Since Last Visit: 1 lb Starting Weight: 219 lb Total Weight Loss (lbs): 7 lb (3.175 kg) Peak Weight: 320 lb   Body Composition  Body Fat %: 41.9 % Fat Mass (lbs): 89 lbs Muscle Mass (lbs): 117.4 lbs Total Body Water (lbs): 77.8 lbs Visceral Fat Rating : 10   Other Clinical Data Fasting: No Labs: No Today's Visit #: 5 Starting Date: 07/14/23    Chief Complaint: OBESITY  History of Present Illness   The patient, diagnosed with insulin resistance, vitamin D deficiency, and obesity, presents for a follow-up visit. She is currently on weekly prescription vitamin D and metformin for insulin resistance. Despite efforts to improve diet and exercise, the patient reports a one-pound weight gain over the past two weeks. She admits to adhering to her category one dietary plan only 50% of the time and is not currently exercising.  The patient expresses difficulty in maintaining her diet due to the high cost of food and the discontinuation of Ozempic, which had previously aided her weight loss. She also reports feeling bloated and experiencing muscle cramps. She has been taking Miralax intermittently, but expresses a desire to return to using [place], which she had previously forgotten about. She also reports changes in urination, with a weaker stream than usual, which she attributes to being post-menopausal.  The patient has been experiencing fatigue, which she believes is hindering her ability to exercise. She also reports a recent echocardiogram, which she believes showed a small leak, but she is unsure of the implications. She expresses a desire to understand more about her metabolic health and potential strategies for weight  loss, particularly in the lead-up to the holiday season. She also expresses interest in potential weight loss medications.          PHYSICAL EXAM:  Blood pressure 108/73, pulse 75, temperature 98.1 F (36.7 C), height 5' 9.5" (1.765 m), weight 212 lb (96.2 kg), last menstrual period 12/08/2006, SpO2 100%. Body mass index is 30.86 kg/m.  DIAGNOSTIC DATA REVIEWED:  BMET    Component Value Date/Time   NA 142 07/14/2023 0952   NA 144 03/21/2012 0649   K 4.8 07/14/2023 0952   K 3.4 (L) 03/21/2012 0649   CL 105 07/14/2023 0952   CL 109 (H) 03/21/2012 0649   CO2 27 07/14/2023 0952   CO2 27 03/21/2012 0649   GLUCOSE 75 07/14/2023 0952   GLUCOSE 74 10/22/2022 0813   GLUCOSE 95 03/21/2012 0649   BUN 13 07/14/2023 0952   BUN 3 (L) 03/21/2012 0649   CREATININE 0.80 07/14/2023 0952   CREATININE 0.76 03/21/2012 0649   CALCIUM 9.5 07/14/2023 0952   CALCIUM 8.2 (L) 03/21/2012 0649   GFRNONAA >60 05/11/2020 1626   GFRNONAA >60 03/21/2012 0649   GFRAA >60 05/11/2020 1626   GFRAA >60 03/21/2012 0649   Lab Results  Component Value Date   HGBA1C 5.1 07/14/2023   HGBA1C 5.7 06/18/2018   Lab Results  Component Value Date   INSULIN 14.5 07/14/2023   Lab Results  Component Value Date   TSH 2.630 07/14/2023   CBC    Component Value Date/Time   WBC 4.9 07/14/2023 0952   WBC 5.6 07/19/2022 0956   RBC 4.56 07/14/2023 0952   RBC  4.45 07/19/2022 0956   HGB 13.7 07/14/2023 0952   HCT 40.8 07/14/2023 0952   PLT 204 07/14/2023 0952   MCV 90 07/14/2023 0952   MCV 89 03/21/2012 0649   MCH 30.0 07/14/2023 0952   MCH 28.3 05/11/2020 1626   MCHC 33.6 07/14/2023 0952   MCHC 34.2 07/19/2022 0956   RDW 12.5 07/14/2023 0952   RDW 14.6 (H) 03/21/2012 0649   Iron Studies    Component Value Date/Time   IRON 58 06/26/2020 0855   FERRITIN 46.6 06/26/2020 0855   IRONPCTSAT 15.1 (L) 06/26/2020 0855   Lipid Panel     Component Value Date/Time   CHOL 278 (H) 07/14/2023 0952   CHOL 235  (H) 03/19/2012 0445   TRIG 227 (H) 07/14/2023 0952   TRIG 258 (H) 03/19/2012 0445   HDL 47 07/14/2023 0952   HDL 35 (L) 03/19/2012 0445   CHOLHDL 5 10/22/2022 0813   VLDL 33.6 10/22/2022 0813   VLDL 52 (H) 03/19/2012 0445   LDLCALC 188 (H) 07/14/2023 0952   LDLCALC 148 (H) 03/19/2012 0445   LDLDIRECT 181.0 06/26/2020 0855   Hepatic Function Panel     Component Value Date/Time   PROT 6.8 07/14/2023 0952   PROT 6.8 03/19/2012 0445   ALBUMIN 4.5 07/14/2023 0952   ALBUMIN 3.3 (L) 03/19/2012 0445   AST 17 07/14/2023 0952   AST 24 03/19/2012 0445   ALT 17 07/14/2023 0952   ALT 28 03/19/2012 0445   ALKPHOS 83 07/14/2023 0952   ALKPHOS 102 03/19/2012 0445   BILITOT 0.2 07/14/2023 0952   BILITOT 0.6 03/19/2012 0445   BILIDIR 0.0 10/22/2022 0813      Component Value Date/Time   TSH 2.630 07/14/2023 0952   Nutritional Lab Results  Component Value Date   VD25OH 33.7 07/14/2023   VD25OH 23.27 (L) 06/18/2018   VD25OH 28.42 (L) 08/01/2016     Assessment and Plan    Vitamin D Deficiency Vitamin D level improved but not yet at goal on weekly prescription vitamin D. -Continue weekly prescription vitamin D. -Plan to recheck vitamin D level at next visit.  Insulin Resistance On metformin, no reported side effects. -Continue metformin.  Obesity Weight gain of 1 pound in the last two weeks. Adherence to diet plan is 50%. Not currently exercising. -Transition to a journaling plan with a range of 1100-1300 calories and at least 80g of protein daily. -Encourage 10 minutes of cardio and 10 minutes of muscle-strengthening exercises most days. -Provide list of high-protein foods. -Plan to discuss holiday eating strategies at next visit.  Constipation Feeling bloated and experiencing weak urinary stream. -Encourage daily use of Benefiber and as-needed use of Miralax. -Ensure adequate hydration. -Consider discussion with PCP if symptoms persist or worsen.  Follow-up Next  appointment in 3 weeks on November 12th. -Plan to fast for next visit for lab work. -Refill metformin and vitamin D prescriptions.      She was informed of the importance of frequent follow up visits to maximize her success with intensive lifestyle modifications for her multiple health conditions.    Quillian Quince, MD

## 2023-09-10 ENCOUNTER — Ambulatory Visit
Admission: RE | Admit: 2023-09-10 | Discharge: 2023-09-10 | Disposition: A | Discharge status: Home / Self Care | Payer: Medicare PPO | Source: Ambulatory Visit | Attending: Internal Medicine | Admitting: Internal Medicine

## 2023-09-10 ENCOUNTER — Inpatient Hospital Stay: Payer: Medicare PPO | Attending: Obstetrics and Gynecology | Admitting: Nurse Practitioner

## 2023-09-10 VITALS — BP 120/70 | HR 86 | Temp 97.0°F | Resp 20 | Wt 215.5 lb

## 2023-09-10 DIAGNOSIS — Z7984 Long term (current) use of oral hypoglycemic drugs: Secondary | ICD-10-CM | POA: Insufficient documentation

## 2023-09-10 DIAGNOSIS — E782 Mixed hyperlipidemia: Secondary | ICD-10-CM | POA: Diagnosis not present

## 2023-09-10 DIAGNOSIS — Z79899 Other long term (current) drug therapy: Secondary | ICD-10-CM | POA: Insufficient documentation

## 2023-09-10 DIAGNOSIS — Z800 Family history of malignant neoplasm of digestive organs: Secondary | ICD-10-CM | POA: Diagnosis not present

## 2023-09-10 DIAGNOSIS — E669 Obesity, unspecified: Secondary | ICD-10-CM | POA: Diagnosis not present

## 2023-09-10 DIAGNOSIS — G4733 Obstructive sleep apnea (adult) (pediatric): Secondary | ICD-10-CM | POA: Insufficient documentation

## 2023-09-10 DIAGNOSIS — N901 Moderate vulvar dysplasia: Secondary | ICD-10-CM

## 2023-09-10 DIAGNOSIS — K219 Gastro-esophageal reflux disease without esophagitis: Secondary | ICD-10-CM | POA: Insufficient documentation

## 2023-09-10 DIAGNOSIS — Z8616 Personal history of COVID-19: Secondary | ICD-10-CM | POA: Diagnosis not present

## 2023-09-10 DIAGNOSIS — E538 Deficiency of other specified B group vitamins: Secondary | ICD-10-CM | POA: Diagnosis not present

## 2023-09-10 DIAGNOSIS — N891 Moderate vaginal dysplasia: Secondary | ICD-10-CM | POA: Diagnosis not present

## 2023-09-10 DIAGNOSIS — G935 Compression of brain: Secondary | ICD-10-CM | POA: Insufficient documentation

## 2023-09-10 DIAGNOSIS — Z6831 Body mass index (BMI) 31.0-31.9, adult: Secondary | ICD-10-CM | POA: Insufficient documentation

## 2023-09-10 DIAGNOSIS — Z1231 Encounter for screening mammogram for malignant neoplasm of breast: Secondary | ICD-10-CM | POA: Diagnosis not present

## 2023-09-10 DIAGNOSIS — M51369 Other intervertebral disc degeneration, lumbar region without mention of lumbar back pain or lower extremity pain: Secondary | ICD-10-CM | POA: Diagnosis not present

## 2023-09-10 DIAGNOSIS — E559 Vitamin D deficiency, unspecified: Secondary | ICD-10-CM | POA: Insufficient documentation

## 2023-09-10 DIAGNOSIS — K5900 Constipation, unspecified: Secondary | ICD-10-CM | POA: Insufficient documentation

## 2023-09-10 DIAGNOSIS — R739 Hyperglycemia, unspecified: Secondary | ICD-10-CM | POA: Insufficient documentation

## 2023-09-10 DIAGNOSIS — I341 Nonrheumatic mitral (valve) prolapse: Secondary | ICD-10-CM | POA: Insufficient documentation

## 2023-09-10 MED ORDER — ESTRADIOL 0.1 MG/GM VA CREA: 1.0000 | TOPICAL_CREAM | VAGINAL | 0 refills | Status: DC

## 2023-09-10 NOTE — Progress Notes (Signed)
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 Sonia Side on 04/24/22. Per Helmut Muster Copland it looks like she had a LEEP in 2008 by Dr. Toya Smothers.   12/18/22-  Pap- NILM, HPV Negative  Biopsies were obtained d/t persistent vaginal irritation along left vulva and introitus.  DIAGNOSIS:  A. VULVA, LEFT LATERAL; BIOPSY:  - MILD SPONGIOSIS AND KERATOSIS.  - NEGATIVE FOR SQUAMOUS INTRAEPITHELIAL LESION AND MALIGNANCY.   B.  VULVA, 1:00 MEDIAL; BIOPSY:  - LOW-GRADE SQUAMOUS INTRAEPITHELIAL LESION (LSIL/VIN-1)   Topical imiquimod was recommended 3 nights a week. She was unable to tolerate and discontinued after single dose.   She continues to have irritation of left vulva. Some chronic pulling pain of vagina which is similar to previous and unchanged. She's using estrogen topically to vulva 3 nights a week.     Gynecologic History:  She presented with pain and slow healing hole in LT labia minora; sx off and on for over a year. Has had neg yeast and BV cultures, neg HSV 2 testing. Pt has tried nystatin crm, estrace crm, coconut oil, and vaseline without full relief. She couldn't tolerate lotrisone cream.   03/04/2022 On exam noted to have anterior vulvar lesion that was biopsied. 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   Non-smoker, No abnormal Pap - last Pap 06/24/2019 NIML satisfactory with transformation zone. She doesn't recall a h/o abnormal Pap but thinks she had a LEEP in 2011.   On 04/24/22 she underwent EUA, Colpo of vulva, ablation with bovie, biopsy, and periurethral biopsy. Exam under anesthesia and colposcopy of the vulva after application of acetic acid revealed 20 x 0.8 mm acetowhite  epithelium involving the vulvar tissue adjacent to the urethra. The vagina and cervix were normal. There were no lesion. Bimanual exam was negative for masses or nodularity. All visible vulvar lesions were ablated.   DIAGNOSIS:  A.  VULVA, INFERIOR; BIOPSY: 1:00 (0.7 x 0.2 x 0.2 cm) - HIGH-GRADE SQUAMOUS INTRAEPITHELIAL LESION (HSIL/VIN-2).   B.  PERI URETHRA; BIOPSY:  - HIGH-GRADE SQUAMOUS INTRAEPITHELIAL LESION (HSIL/VIN-2).  These areas were completely ablated.   She represented to clinic with pain/irritation lower on the vulva adjacent the the Bartholin's gland duct.   06/12/2022 DIAGNOSIS:  A. VULVA, LEFT; BIOPSY:  - BENIGN SQUAMOUS MUCOSA WITH FOCAL INFLAMMATION AND REACTIVE CELLULAR CHANGES.  - NEGATIVE FOR SQUAMOUS INTRAEPITHELIAL LESION AND MALIGNANCY.    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   TMJ arthralgia 10/27/2022   Hip pain, left 10/22/2022   Sacral pain 10/22/2022   Rash 07/20/2022   Bloating 07/19/2022   Acute cystitis without hematuria 06/12/2022   Vulvar irritation 06/12/2022   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   Ear fullness 02/09/2022   Runny nose 09/30/2021   Dizziness 07/22/2021   B12 deficiency    Vaginal irritation 03/03/2021   Soft tissue mass 03/03/2021   Hemorrhoids  12/23/2020   DDD (degenerative disc disease), lumbar 10/03/2020   Abscess of right hip 05/11/2020   Dyspnea on exertion 05/08/2020   Healthcare maintenance 01/02/2020   Anemia 01/02/2020   Status post total hip replacement, 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   Hyperglycemia 06/24/2019   Vaginal bleeding 09/30/2016   Primary  osteoarthritis of right hip 01/17/2016   Arnold-Chiari malformation (HCC)    Chronic gastritis 07/18/2015   Depression, major, in remission (HCC) 05/01/2015   Generalized anxiety disorder 05/01/2015   Chronic tension-type headache, intractable 04/24/2015   Dysmenorrhea 04/24/2015   Endometriosis 04/24/2015   Hidradenitis 04/24/2015   Hot flashes 03/12/2015   Chest pain 02/15/2015   BMI 31.0-31.9,adult 10/16/2014   Cough 09/28/2014   Breast pain, right 09/08/2014   Abdominal pain 07/24/2014   GERD (gastroesophageal reflux disease) 04/24/2014   Ankle pain, right 04/24/2014   Vitamin D deficiency 01/18/2014   Diverticulitis 01/10/2014   Right hip pain 01/10/2014   Migraine headaches 01/10/2014   Obstructive sleep apnea 01/10/2014   Chronic back pain 12/13/2012   Mitral valve prolapse 12/13/2012    Past Medical History: Past Medical History:  Diagnosis Date   Abdominal pain 07/24/2014   Abscess of right hip 05/11/2020   Anemia 01/02/2020   Ankle pain, right 04/24/2014   Anxiety    Attention deficit disorder (ADD)    B12 deficiency    Back pain    Breast pain, right 09/08/2014   Chest pain 02/15/2015   Chiari malformation type I 01/26/2014   s/p sgy decompression   Chronic back pain 12/13/2012   Chronic gastritis 07/18/2015   Chronic tension-type headache, intractable 04/24/2015   Constipation    DDD (degenerative disc disease), lumbar    Depression    Diverticulitis    Dysmenorrhea    Dysplastic nevus 10/09/2015   R abdomen - moderate, limited margins free.   Endometriosis    Generalized anxiety disorder 05/01/2015   Has previously seen psychiatry. Having some issues with what she feels is related to stress and anxiety   GERD (gastroesophageal reflux disease)    Hemorrhoids 12/23/2020   Hidradenitis    High blood pressure    History of ovarian cyst    Hot flashes 03/12/2015   Hypercholesteremia 01/10/2014   Off lipitor. Elevated cholesterol. Labs discussed.  Restart lipitor. Follow lipid panel and liver function tests.   Hyperglycemia 06/24/2019   IBS (irritable bowel syndrome)    Joint pain    Major depressive disorder 05/01/2015   Migraine headaches 01/10/2014   Has seen neurology. Topamax.   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 Level is 21.6    Past Surgical History: Past Surgical History:  Procedure Laterality Date   ABDOMINAL HYSTERECTOMY  03/27/07   laparoscopic   BREAST EXCISIONAL BIOPSY Left 2004   fibroadenoma   CESAREAN SECTION  1999/2004 twins   chiari decompression     COLONOSCOPY WITH PROPOFOL N/A 04/28/2015   Procedure: COLONOSCOPY WITH PROPOFOL;  Surgeon: Scot Jun, MD;  Location: [facility] ENDOSCOPY;  Service: Endoscopy;  Laterality: N/A;   COLONOSCOPY WITH PROPOFOL N/A 03/19/2021   Procedure: COLONOSCOPY WITH PROPOFOL;  Surgeon: Regis Bill, MD;  Location: [facility] ENDOSCOPY;  Service: Endoscopy;  Laterality: N/A;   COLPOSCOPY N/A 04/24/2022   Procedure: COLPOSCOPY, ABLATION OF VULVAR TISSUE;  Surgeon: Artelia Laroche, MD;  Location: [facility] ORS;  Service: Gynecology;  Laterality: N/A;   CYSTOSCOPY     bladder ulcers   ESOPHAGOGASTRODUODENOSCOPY N/A 04/28/2015   Procedure: ESOPHAGOGASTRODUODENOSCOPY (EGD);  Surgeon: Scot Jun, MD;  Location: [facility] ENDOSCOPY;  Service: Endoscopy;  Laterality: N/A;   ESOPHAGOGASTRODUODENOSCOPY (EGD) WITH  PROPOFOL N/A 03/19/2021   Procedure: ESOPHAGOGASTRODUODENOSCOPY (EGD) WITH PROPOFOL;  Surgeon: Regis Bill, MD;  Location: [facility] ENDOSCOPY;  Service: Endoscopy;  Laterality: N/A;   HERNIA REPAIR Right 2003   inguinal   INCISION AND DRAINAGE HIP Right 05/11/2020   Procedure: IRRIGATION AND DEBRIDEMENT HIP;  Surgeon: Kennedy Bucker, MD;  Location: [facility] ORS;  Service: Orthopedics;  Laterality: Right;   OVARIAN CYST REMOVAL  2001   right   TEMPOROMANDIBULAR JOINT SURGERY  2000   TOTAL HIP ARTHROPLASTY Right 12/28/2019   Procedure: TOTAL HIP ARTHROPLASTY ANTERIOR APPROACH;  Surgeon: Kennedy Bucker, MD;  Location: [facility] ORS;  Service: Orthopedics;  Laterality: Right;   TUBAL LIGATION     Bilateral   VULVA /PERINEUM BIOPSY  04/24/2022   Procedure: VULVAR BIOPSY;  Surgeon: Artelia Laroche, MD;  Location: [facility] ORS;  Service: Gynecology;;   Past Gynecologic History:  Menarche: 14 Menstrual details: n/a History of Abnormal pap: as per HPI Last pap: 06/24/2019 NIML satisfactory with transformation zone.  Sexually active: yes  OB History:  OB History     Gravida  2   Para  2   Term  2   Preterm      AB      Living  3      SAB      IAB      Ectopic      Multiple  1   Live Births  3          Family History: Family History  Problem Relation Age of Onset   Hyperlipidemia Mother    Anxiety disorder Mother    Atrial fibrillation Mother    Hypertension Mother    Memory loss Mother    Anxiety disorder Father    Stroke Father    Hyperlipidemia Father    Hypertension Father    Congestive Heart Failure Father    Depression Father    Hyperlipidemia Brother    Colon cancer Paternal Aunt    Dementia Paternal Aunt    Dementia Maternal Grandmother    Cancer Maternal Grandmother        unk primary; d. late 62s   Dementia Paternal Grandmother    ADD / ADHD Daughter    Breast cancer Neg Hx    Social History: Social History   Tobacco Use   Smoking status: Never    Smokeless tobacco: Never  Vaping Use   Vaping status: Never Used  Substance Use Topics   Alcohol use: Not Currently    Alcohol/week: 0.0 standard drinks of alcohol   Drug use: No    Allergies: Allergies  Allergen Reactions   Vancomycin Hives   Ceftin [Cefuroxime Axetil] Other (See Comments)  Upset stomach   Celexa [Citalopram Hydrobromide] Diarrhea    GI upset     Clindamycin/Lincomycin Rash   Penicillins Rash    Did it involve swelling of the face/tongue/throat, SOB, or low BP? No Did it involve sudden or severe rash/hives, skin peeling, or any reaction on the inside of your mouth or nose? Unknown Did you need to seek medical attention at a hospital or doctor's office? No When did it last happen? childhood reaction If all above answers are "NO", may proceed with cephalosporin use.    Sulfa Antibiotics Rash   Current Medications: Current Outpatient Medications on File Prior to Visit  Medication Sig Dispense Refill   acetaminophen (TYLENOL) 325 MG tablet Take 500 mg by mouth every 6 (six) hours as needed.     AIMOVIG 140 MG/ML SOAJ Inject 140 mg into the skin every 28 (twenty-eight) days.     buPROPion (WELLBUTRIN XL) 150 MG 24 hr tablet Take 1 tablet (150 mg total) by mouth daily. 30 tablet 1   Crisaborole (EUCRISA) 2 % OINT Apply twice a day to hands prn flares 60 g 2   cyanocobalamin (VITAMIN B12) 1000 MCG/ML injection INJECT 1 ML INTRAMUSCULARLY  ONCE EVERY MONTH 10 mL 0   diclofenac Sodium (VOLTAREN) 1 % GEL SMARTSIG:Gram(s) Topical 4 Times Daily PRN     esomeprazole (NEXIUM) 40 MG capsule Take 1 capsule by mouth once daily 90 capsule 3   estradiol (ESTRACE) 0.1 MG/GM vaginal cream Place 1 Applicatorful vaginally 2 (two) times a week. 42.5 g 0   Evolocumab (REPATHA SURECLICK) 140 MG/ML SOAJ Inject 140 mg into the skin every 14 (fourteen) days. 2 mL 3   HYDROcodone-acetaminophen (NORCO) 10-325 MG tablet Take 1 tablet by mouth every 6 (six) hours as needed.     lactase  (RA DAIRY AID) 3000 units tablet Take 9,000 Units by mouth 3 (three) times daily as needed (with dairy consumption).      metFORMIN (GLUCOPHAGE) 500 MG tablet Take 1 tablet (500 mg total) by mouth daily with breakfast. 30 tablet 0   methocarbamol (ROBAXIN) 500 MG tablet Take 500 mg by mouth 4 (four) times daily.     rizatriptan (MAXALT) 10 MG tablet Take 10 mg by mouth as needed for migraine. May repeat in 2 hours if needed     [START ON 10/14/2023] sertraline (ZOLOFT) 100 MG tablet Take 2 tablets (200 mg total) by mouth daily. 180 tablet 0   Simethicone (GAS RELIEF PO) Take 125 mg by mouth 2 (two) times daily as needed for flatulence. Anti gas     topiramate (TOPAMAX) 100 MG tablet Take 100 mg by mouth at bedtime.     Ubrogepant (UBRELVY) 100 MG TABS Take 100 mg by mouth 2 (two) times daily as needed.     Vitamin D, Ergocalciferol, (DRISDOL) 1.25 MG (50000 UNIT) CAPS capsule Take 1 capsule (50,000 Units total) by mouth every 7 (seven) days. 4 capsule 0   XTAMPZA ER 9 MG C12A Take 1 capsule by mouth 2 (two) times daily.     No current facility-administered medications on file prior to visit.   Review of Systems General:  fatigue Skin: no complaints Eyes: no complaints HEENT: no complaints Breasts: no complaints Pulmonary: no complaints Cardiac: no complaints Gastrointestinal: abdominal pain, bloating, appetite changes. Nausea.  Genitourinary/Sexual: no complaints Ob/Gyn: Vulvar irritation and vaginal pain at time. Chronic. Per hpi Musculoskeletal: no complaints Hematology: no complaints Neurologic/Psych: no complaints   Objective:  Physical Examination:  Today's Vitals  09/10/23 0851  BP: 120/70  Pulse: 86  Resp: 20  Temp: (!) 97 F (36.1 C)  SpO2: 100%  Weight: 215 lb 8 oz (97.8 kg)   Body mass index is 31.37 kg/m.  ECOG Performance Status: 0 - Asymptomatic  GENERAL: Patient is a well appearing female in no acute distress HEENT:  Sclera clear. Anicteric LUNGS:  No  audible wheezing or coughing HEART:  Regular rate and rhythm.  ABDOMEN:  Soft, nontender.  EXTREMITIES:  No peripheral edema. Atraumatic. No cyanosis SKIN:  Clear with no obvious rashes or skin changes.  NEURO:  Nonfocal. Well oriented.  Appropriate affect.  Pelvic: chaperoned by CMA EGBUS: no gross lesions; Area of prior surgery depigmented on labia minora. Atrophy of vaginal introitus.  Cervix: no lesions, nontender, mobile, stenotic os. Pap obtained.  Vagina: no lesions, no discharge or bleeding. Positive for atrophy Uterus: surgically absent BME: no palpable masses  Lab Review No labs on site today.   Radiologic Imaging: N/a    Assessment:  Kathleen Everett is a 52 y.o. 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 Symptomatic with vulvar irritation, colposcopy more c/w atrophy than VIN. Biopsies obtained. Were benign and VIN I. Recommended topical treatment with aldara but she was unable to tolerate and discontinued. Today, stable symptoms and exam.   Medical co-morbidities complicating care: Chiari malformation type I, Sleep apnea, hyperglycemia, prior laparotomy Plan:   Problem List Items Addressed This Visit   Vulvar intraepithelial neoplasia (VIN) grade 2  Suspect her poor tolerance to aldara was more related to her atrophy as opposed to reaction to aldara. Recommend she re-try. Can also try intravaginal estrogen administration to see if that improves her day to day symptoms. Provided her with applicators and reviewed instructions. Also reviewed use of small thin layer of cream applied to left labia with barrier cream to non-treatment surfaces. She's hesitant to re-try and prefers observation. Will lplan to see her back in 6 months and may repeat colposcopy & possible biopsies at that time.   Consuello Masse, DNP, AGNP-C, AOCNP [facility] [phone] (clinic)

## 2023-09-11 ENCOUNTER — Encounter: Payer: Self-pay | Admitting: Nurse Practitioner

## 2023-09-11 ENCOUNTER — Ambulatory Visit: Payer: Medicare PPO | Admitting: Nurse Practitioner

## 2023-09-11 VITALS — BP 118/74 | HR 77 | Temp 98.2°F | Ht 69.5 in | Wt 214.6 lb

## 2023-09-11 DIAGNOSIS — H9202 Otalgia, left ear: Secondary | ICD-10-CM | POA: Diagnosis not present

## 2023-09-11 DIAGNOSIS — M791 Myalgia, unspecified site: Secondary | ICD-10-CM

## 2023-09-11 DIAGNOSIS — M25552 Pain in left hip: Secondary | ICD-10-CM | POA: Diagnosis not present

## 2023-09-11 LAB — CBC WITH DIFFERENTIAL/PLATELET
Basophils Absolute: 0 10*3/uL (ref 0.0–0.1)
Basophils Relative: 0.5 % (ref 0.0–3.0)
Eosinophils Absolute: 0.1 10*3/uL (ref 0.0–0.7)
Eosinophils Relative: 1.1 % (ref 0.0–5.0)
HCT: 41.5 % (ref 36.0–46.0)
Hemoglobin: 13.5 g/dL (ref 12.0–15.0)
Lymphocytes Relative: 28.4 % (ref 12.0–46.0)
Lymphs Abs: 1.4 10*3/uL (ref 0.7–4.0)
MCHC: 32.6 g/dL (ref 30.0–36.0)
MCV: 90.5 fL (ref 78.0–100.0)
Monocytes Absolute: 0.4 10*3/uL (ref 0.1–1.0)
Monocytes Relative: 7.5 % (ref 3.0–12.0)
Neutro Abs: 3.1 10*3/uL (ref 1.4–7.7)
Neutrophils Relative %: 62.5 % (ref 43.0–77.0)
Platelets: 230 10*3/uL (ref 150.0–400.0)
RBC: 4.58 Mil/uL (ref 3.87–5.11)
RDW: 13.6 % (ref 11.5–15.5)
WBC: 4.9 10*3/uL (ref 4.0–10.5)

## 2023-09-11 LAB — BASIC METABOLIC PANEL
BUN: 14 mg/dL (ref 6–23)
CO2: 29 meq/L (ref 19–32)
Calcium: 9.4 mg/dL (ref 8.4–10.5)
Chloride: 104 meq/L (ref 96–112)
Creatinine, Ser: 0.84 mg/dL (ref 0.40–1.20)
GFR: 81.15 mL/min (ref >60.00)
Glucose, Bld: 70 mg/dL (ref 70–99)
Potassium: 3.8 meq/L (ref 3.5–5.1)
Sodium: 140 meq/L (ref 135–145)

## 2023-09-11 LAB — CK: Total CK: 34 U/L (ref 7–177)

## 2023-09-11 LAB — IBC + FERRITIN
Ferritin: 99.1 ng/mL (ref 10.0–291.0)
Iron: 63 ug/dL (ref 42–145)
Saturation Ratios: 19.4 % — ABNORMAL LOW (ref 20.0–50.0)
TIBC: 324.8 ug/dL (ref 250.0–450.0)
Transferrin: 232 mg/dL (ref 212.0–360.0)

## 2023-09-11 LAB — MAGNESIUM: Magnesium: 2.3 mg/dL (ref 1.5–2.5)

## 2023-09-11 MED ORDER — NEOMYCIN-POLYMYXIN-HC 1 % OT SOLN: OTIC | 0 refills | Status: DC

## 2023-09-11 NOTE — Assessment & Plan Note (Addendum)
Some fluid behind the ear.  Will try Polytrim eardrops.  Referral placed for ENT if symptoms not improving advised patient to see ENT since her symptom has been going on for the last 2 to 3 months.

## 2023-09-11 NOTE — Progress Notes (Signed)
Established Patient Office Visit  Subjective:  Patient ID: Kathleen Everett, female    DOB: Sep 04, 1971  Age: 52 y.o. MRN: [id number]  CC:  Chief Complaint  Patient presents with   Medical Management of Chronic Issues    Left hip & left groin pain 4/10 Intermittent deep left ear pain 4/10    HPI  Kathleen Everett presents for acute visit she has h/o chronic left hip pain and need referral to [facility] orthopedic. She has appointment with the ortho next week but need referral before she can see them.  Patient states that she has chronic left hip pain but from last 1 months she has been experiencing the pain radiating to her groin area intermittently when she walks.   Patient reports no pain while sitting.  She denies any numbness to the saddle area.   She had DG Left hip 10/22/22: There is no evidence of hip fracture or dislocation. Total hip arthroplasty changes are noted on the right. There is no evidence of arthropathy or other focal bone abnormality    She has undergone right total hip replacement in 2021. She has chronic back pain and is followed by pain management.    She also reports intermittent left ear pain from last 2 to 3 months.  She describes the pain as heaviness intermittently. States since she had COVID she has intermittent runny nose and postnasal drip.   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   Endometriosis    Generalized anxiety disorder 05/01/2015   Has previously seen psychiatry. Having some issues with what she feels is related to stress and anxiety   GERD (gastroesophageal reflux disease)    Hemorrhoids 12/23/2020   Hidradenitis    High blood pressure    History of ovarian cyst    Hot flashes 03/12/2015   Hypercholesteremia 01/10/2014   Off lipitor. Elevated cholesterol. Labs discussed. Restart lipitor. Follow lipid panel and liver function tests.   Hyperglycemia 06/24/2019   IBS (irritable bowel syndrome)    Joint pain    Major depressive disorder 05/01/2015   Migraine headaches 01/10/2014   Has seen neurology. Topamax.   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 Level is 21.6    Past Surgical History:  Procedure Laterality Date   ABDOMINAL HYSTERECTOMY  03/27/07   laparoscopic   BREAST EXCISIONAL BIOPSY Left 2004   fibroadenoma   CESAREAN SECTION     1999/2004 twins   chiari decompression     COLONOSCOPY WITH PROPOFOL N/A 04/28/2015   Procedure: COLONOSCOPY WITH PROPOFOL;  Surgeon: Scot Jun, MD;  Location: [facility] ENDOSCOPY;  Service: Endoscopy;  Laterality: N/A;   COLONOSCOPY WITH PROPOFOL N/A 03/19/2021   Procedure: COLONOSCOPY WITH PROPOFOL;  Surgeon: Regis Bill, MD;  Location: [facility] ENDOSCOPY;  Service: Endoscopy;  Laterality:  N/A;   COLPOSCOPY N/A 04/24/2022   Procedure: COLPOSCOPY, ABLATION OF VULVAR TISSUE;  Surgeon: Artelia Laroche, MD;  Location: [facility] ORS;  Service: Gynecology;  Laterality: N/A;   CYSTOSCOPY     bladder ulcers   ESOPHAGOGASTRODUODENOSCOPY N/A 04/28/2015   Procedure: ESOPHAGOGASTRODUODENOSCOPY (EGD);  Surgeon: Scot Jun, MD;  Location: [facility] ENDOSCOPY;  Service: Endoscopy;  Laterality: N/A;   ESOPHAGOGASTRODUODENOSCOPY (EGD) WITH PROPOFOL N/A 03/19/2021   Procedure: ESOPHAGOGASTRODUODENOSCOPY (EGD) WITH PROPOFOL;  Surgeon: Regis Bill, MD;  Location: [facility] ENDOSCOPY;  Service: Endoscopy;  Laterality: N/A;   HERNIA REPAIR Right 2003   inguinal   INCISION AND DRAINAGE HIP Right 05/11/2020   Procedure: IRRIGATION AND DEBRIDEMENT HIP;  Surgeon: Kennedy Bucker, MD;  Location: [facility] ORS;  Service: Orthopedics;  Laterality: Right;   OVARIAN CYST REMOVAL  2001   right   TEMPOROMANDIBULAR JOINT SURGERY  2000   TOTAL HIP ARTHROPLASTY Right 12/28/2019   Procedure: TOTAL HIP ARTHROPLASTY ANTERIOR APPROACH;  Surgeon: Kennedy Bucker, MD;  Location: [facility] ORS;  Service: Orthopedics;  Laterality: Right;   TUBAL LIGATION     Bilateral   VULVA /PERINEUM BIOPSY  04/24/2022   Procedure: VULVAR BIOPSY;  Surgeon: Artelia Laroche, MD;  Location: [facility] ORS;  Service: Gynecology;;    Family History  Problem Relation Age of Onset   Hyperlipidemia Mother    Anxiety disorder Mother    Atrial fibrillation Mother    Hypertension Mother    Memory loss Mother    Anxiety disorder Father    Stroke Father    Hyperlipidemia Father    Hypertension Father    Congestive Heart Failure Father    Depression Father    Hyperlipidemia Brother    Colon cancer Paternal Aunt    Dementia Paternal Aunt    Dementia Maternal Grandmother    Cancer Maternal Grandmother        unk primary; d. late 16s   Dementia Paternal Grandmother    ADD / ADHD Daughter    Breast cancer Neg Hx     Social History    Socioeconomic History   Marital status: Married    Spouse name: Thayer Ohm   Number of children: 3   Years of education: 13   Highest education level: Some college, no degree  Occupational History   Occupation: Disability  Tobacco Use   Smoking status: Never   Smokeless tobacco: Never  Vaping Use   Vaping status: Never Used  Substance and Sexual Activity   Alcohol use: Not Currently    Alcohol/week: 0.0 standard drinks of alcohol   Drug use: No   Sexual activity: Yes    Birth control/protection: Surgical    Comment: Hysterectomy  Other Topics Concern   Not on file  Social History Narrative   Patient drinks 2-3 cups  of caffeine daily.   Patient is right handed.       Social Hx:   Current living situation- [place]. Living with husband and 3 kids   Born and Raised- [place].raised by both parents.    Siblings- 1 brother who is 7 yrs older than her   Legal issues- none   Married for 24 yrs. Currently unemployed since 2011 when she hurt her back. HS grad and CNA and phlebotomy.    Social Determinants of Health   Financial Resource Strain: Medium Risk (04/20/2023)   Received from [facility], [facility]   Overall Financial Resource Strain (CARDIA)    Difficulty of Paying Living Expenses: Somewhat hard  Food Insecurity: Food Insecurity Present (04/20/2023)   Received from [facility], [facility]   Hunger Vital Sign    Worried About Running Out of Food in the Last Year: Sometimes true    Ran Out of Food in the Last Year: Sometimes true  Transportation Needs: No Transportation Needs (04/20/2023)   Received from [facility], [facility]   PRAPARE - Transportation    Lack of Transportation (Medical): No    Lack of Transportation (Non-Medical): No  Physical Activity: Unknown (04/20/2023)   Received from [facility], [facility]   Exercise Vital Sign    Days of Exercise per Week: 0 days    Minutes of Exercise per Session: Not on file  Recent Concern: Physical Activity -  Insufficiently Active (03/14/2023)   Exercise Vital Sign    Days of Exercise per Week: 7 days    Minutes of Exercise per Session: 10 min  Stress: Stress Concern Present (04/20/2023)   Received from [facility], [facility] - Occupational Stress Questionnaire    Feeling of Stress : Very much  Social Connections: Socially Isolated (04/20/2023)   Received from [facility], [facility]   Social Network    How would you rate your social network (family, work, friends)?: Little participation, lonely and socially isolated  Intimate Partner Violence: Not At Risk (04/20/2023)   Received from [facility], [facility]   HITS    Over the last 12 months how often did your partner physically hurt you?: 1    Over the last 12 months how often did your partner insult you or talk down to you?: 1    Over the last 12 months how often did your partner threaten you with physical harm?: 1    Over the last 12 months how often did your partner scream or curse at you?: 1     Outpatient Medications Prior to Visit  Medication Sig Dispense Refill   acetaminophen (TYLENOL) 325 MG tablet Take 500 mg by mouth every 6 (six) hours as needed.     AIMOVIG 140 MG/ML SOAJ Inject 140 mg into the skin every 28 (twenty-eight) days.     buPROPion (WELLBUTRIN XL) 150 MG 24 hr tablet Take 1 tablet (150 mg total) by mouth daily. 30 tablet 1   Crisaborole (EUCRISA) 2 % OINT Apply twice a day to hands prn flares 60 g 2   cyanocobalamin (VITAMIN B12) 1000 MCG/ML injection INJECT 1 ML INTRAMUSCULARLY  ONCE EVERY MONTH 10 mL 0   diclofenac Sodium (VOLTAREN) 1 % GEL SMARTSIG:Gram(s) Topical 4 Times Daily PRN     esomeprazole (NEXIUM) 40 MG capsule Take 1 capsule by mouth once daily 90 capsule 3   estradiol (ESTRACE) 0.1 MG/GM vaginal cream Place 1 Applicatorful vaginally 4 (four) times  a week. At bedtime 42.5 g 0   HYDROcodone-acetaminophen (NORCO) 10-325 MG tablet Take 1 tablet by mouth  every 6 (six) hours as needed.     metFORMIN (GLUCOPHAGE) 500 MG tablet Take 1 tablet (500 mg total) by mouth daily with breakfast. 30 tablet 0   methocarbamol (ROBAXIN) 500 MG tablet Take 500 mg by mouth 4 (four) times daily.     rizatriptan (MAXALT) 10 MG tablet Take 10 mg by mouth as needed for migraine. May repeat in 2 hours if needed     [START ON 10/14/2023] sertraline (ZOLOFT) 100 MG tablet Take 2 tablets (200 mg total) by mouth daily. 180 tablet 0   topiramate (TOPAMAX) 100 MG tablet Take 100 mg by mouth at bedtime.     Ubrogepant (UBRELVY) 100 MG TABS Take 100 mg by mouth 2 (two) times daily as needed.     Vitamin D, Ergocalciferol, (DRISDOL) 1.25 MG (50000 UNIT) CAPS capsule Take 1 capsule (50,000 Units total) by mouth every 7 (seven) days. 4 capsule 0   XTAMPZA ER 9 MG C12A Take 1 capsule by mouth 2 (two) times daily.     Evolocumab (REPATHA SURECLICK) 140 MG/ML SOAJ Inject 140 mg into the skin every 14 (fourteen) days. 2 mL 3   lactase (RA DAIRY AID) 3000 units tablet Take 9,000 Units by mouth 3 (three) times daily as needed (with dairy consumption).      Simethicone (GAS RELIEF PO) Take 125 mg by mouth 2 (two) times daily as needed for flatulence. Anti gas     No facility-administered medications prior to visit.    Allergies  Allergen Reactions   Vancomycin Hives   Ceftin [Cefuroxime Axetil] Other (See Comments)    Upset stomach   Celexa [Citalopram Hydrobromide] Diarrhea    GI upset     Clindamycin/Lincomycin Rash   Penicillins Rash    Did it involve swelling of the face/tongue/throat, SOB, or low BP? No Did it involve sudden or severe rash/hives, skin peeling, or any reaction on the inside of your mouth or nose? Unknown Did you need to seek medical attention at a hospital or doctor's office? No When did it last happen? childhood reaction If all above answers are "NO", may proceed with cephalosporin use.    Sulfa Antibiotics Rash    ROS Review of Systems Negative  unless indicated in HPI.    Objective:    Physical Exam Constitutional:      Appearance: Normal appearance.  HENT:     Right Ear: Tympanic membrane normal.     Left Ear: A middle ear effusion is present.     Nose:     Right Turbinates: Not enlarged.     Left Turbinates: Not enlarged.     Mouth/Throat:     Pharynx: Postnasal drip present. No oropharyngeal exudate or posterior oropharyngeal erythema.  Cardiovascular:     Rate and Rhythm: Normal rate and regular rhythm.     Pulses: Normal pulses.     Heart sounds: Normal heart sounds.  Pulmonary:     Effort: Pulmonary effort is normal.     Breath sounds: Normal breath sounds. No stridor. No wheezing.  Musculoskeletal:        General: Normal range of motion.     Comments: Limited ROM left leg.   Skin:    General: Skin is warm.  Neurological:     General: No focal deficit present.     Mental Status: She is alert and oriented to person, place, and  time. Mental status is at baseline.  Psychiatric:        Mood and Affect: Mood normal.        Behavior: Behavior normal.        Thought Content: Thought content normal.     BP 118/74   Pulse 77   Temp 98.2 F (36.8 C)   Ht 5' 9.5" (1.765 m)   Wt 214 lb 9.6 oz (97.3 kg)   LMP 12/08/2006   SpO2 99%   BMI 31.24 kg/m  Wt Readings from Last 3 Encounters:  09/11/23 214 lb 9.6 oz (97.3 kg)  09/10/23 215 lb 8 oz (97.8 kg)  09/09/23 212 lb (96.2 kg)     Health Maintenance  Topic Date Due   MAMMOGRAM  08/10/2023   Zoster Vaccines- Shingrix (1 of 2) 10/23/2023 (Originally 06/26/1992)   INFLUENZA VACCINE  02/16/2024 (Originally 06/19/2023)   Medicare Annual Wellness (AWV)  03/13/2024   Cervical Cancer Screening (HPV/Pap Cotest)  12/19/2027   Colonoscopy  03/20/2031   DTaP/Tdap/Td (2 - Td or Tdap) 08/18/2032   Hepatitis C Screening  Completed   HIV Screening  Completed   HPV VACCINES  Aged Out   COVID-19 Vaccine  Discontinued    There are no preventive care reminders to  display for this patient.  Lab Results  Component Value Date   TSH 2.630 07/14/2023   Lab Results  Component Value Date   WBC 4.9 07/14/2023   HGB 13.7 07/14/2023   HCT 40.8 07/14/2023   MCV 90 07/14/2023   PLT 204 07/14/2023   Lab Results  Component Value Date   NA 142 07/14/2023   K 4.8 07/14/2023   CO2 27 07/14/2023   GLUCOSE 75 07/14/2023   BUN 13 07/14/2023   CREATININE 0.80 07/14/2023   BILITOT 0.2 07/14/2023   ALKPHOS 83 07/14/2023   AST 17 07/14/2023   ALT 17 07/14/2023   PROT 6.8 07/14/2023   ALBUMIN 4.5 07/14/2023   CALCIUM 9.5 07/14/2023   ANIONGAP 8 12/29/2019   EGFR 90 07/14/2023   GFR 93.55 10/22/2022   Lab Results  Component Value Date   CHOL 278 (H) 07/14/2023   Lab Results  Component Value Date   HDL 47 07/14/2023   Lab Results  Component Value Date   LDLCALC 188 (H) 07/14/2023   Lab Results  Component Value Date   TRIG 227 (H) 07/14/2023   Lab Results  Component Value Date   CHOLHDL 5 10/22/2022   Lab Results  Component Value Date   HGBA1C 5.1 07/14/2023      Assessment & Plan:  Hip pain, left Assessment & Plan: Reviewed Xrays from December 2023. Chronic left hip pain now pain radiating to the groin area intermittently. No numbness to the saddle area  Referral sent to [facility] ortho. Advised to alternate hot and cold pack.   Orders: -     Ambulatory referral to Orthopedics  Muscle pain -     CK -     IBC + Ferritin -     CBC with Differential/Platelet -     Magnesium -     Basic metabolic panel  Otalgia of left ear Assessment & Plan: Some fluid behind the ear.  Will try Polytrim eardrops.  Referral placed for ENT if symptoms not improving advised patient to see ENT since her symptom has been going on for the last 2 to 3 months.   Orders: -     Ambulatory referral to ENT  Other orders -  Neomycin-Polymyxin-HC; 4 gtt in affected ear(s) tid, max of 10 days. Lie with affected ear upward x 5 minutes  Dispense: 10 mL;  Refill: 0    Follow-up: Return if symptoms worsen or fail to improve.   Kara Dies, NP

## 2023-09-11 NOTE — Assessment & Plan Note (Signed)
Reviewed Xrays from December 2023. Chronic left hip pain now pain radiating to the groin area intermittently. No numbness to the saddle area  Referral sent to [facility] ortho. Advised to alternate hot and cold pack.

## 2023-09-11 NOTE — Patient Instructions (Signed)
Alternate hot and cold pack to the left hip. Use polytrim ear drops. Referral sent to take [facility] and [place] [facility]

## 2023-09-12 ENCOUNTER — Telehealth: Payer: Self-pay | Admitting: Internal Medicine

## 2023-09-12 NOTE — Telephone Encounter (Signed)
Please notify that I received information from ENT.  They recommended a f/u with Dr Sherryll Burger - to f/u regarding her dizziness and chiari malformation.  Also, had suggested vestibular therapy (through PT).  Needs a f/u appt with Dr Sherryll Burger.  Also, can see if agreeable for vestibular therapy.

## 2023-09-12 NOTE — Telephone Encounter (Signed)
See me

## 2023-09-13 NOTE — Telephone Encounter (Signed)
Reviewed.  Note that was received 08/2023 - previous message.  Has already had f/u with Dr Sherryll Burger and further treatment.

## 2023-09-17 DIAGNOSIS — M25552 Pain in left hip: Secondary | ICD-10-CM | POA: Diagnosis not present

## 2023-09-17 DIAGNOSIS — M1612 Unilateral primary osteoarthritis, left hip: Secondary | ICD-10-CM | POA: Diagnosis not present

## 2023-09-17 DIAGNOSIS — M7062 Trochanteric bursitis, left hip: Secondary | ICD-10-CM | POA: Diagnosis not present

## 2023-09-24 DIAGNOSIS — M26623 Arthralgia of bilateral temporomandibular joint: Secondary | ICD-10-CM | POA: Diagnosis not present

## 2023-09-24 DIAGNOSIS — M26633 Articular disc disorder of bilateral temporomandibular joint: Secondary | ICD-10-CM | POA: Diagnosis not present

## 2023-09-30 ENCOUNTER — Telehealth (INDEPENDENT_AMBULATORY_CARE_PROVIDER_SITE_OTHER): Payer: Medicare PPO | Admitting: Family Medicine

## 2023-09-30 ENCOUNTER — Encounter (INDEPENDENT_AMBULATORY_CARE_PROVIDER_SITE_OTHER): Payer: Self-pay | Admitting: Family Medicine

## 2023-09-30 VITALS — Ht 69.5 in | Wt 219.0 lb

## 2023-09-30 DIAGNOSIS — E669 Obesity, unspecified: Secondary | ICD-10-CM

## 2023-09-30 DIAGNOSIS — E559 Vitamin D deficiency, unspecified: Secondary | ICD-10-CM

## 2023-09-30 DIAGNOSIS — E88819 Insulin resistance, unspecified: Secondary | ICD-10-CM

## 2023-09-30 DIAGNOSIS — Z6831 Body mass index (BMI) 31.0-31.9, adult: Secondary | ICD-10-CM

## 2023-09-30 MED ORDER — VITAMIN D (ERGOCALCIFEROL) 1.25 MG (50000 UNIT) PO CAPS: 50000.0000 [IU] | ORAL_CAPSULE | ORAL | 0 refills | Status: DC

## 2023-09-30 MED ORDER — METFORMIN HCL 500 MG PO TABS: 500.0000 mg | ORAL_TABLET | Freq: Two times a day (BID) | ORAL | 0 refills | Status: DC

## 2023-09-30 NOTE — Progress Notes (Signed)
.smr  Office: [phone]  /  Fax: [phone]  WEIGHT SUMMARY AND BIOMETRICS  Anthropometric Measurements Height: 5' 9.5" (1.765 m) Weight: 219 lb (99.3 kg) (Per patient from home scale) BMI (Calculated): 31.89 Starting Weight: 219 lb Peak Weight: 320 lb   No data recorded Other Clinical Data Fasting: No Labs: No Today's Visit #: 6 Starting Date: 07/14/23 Comments: Virtual Visit   Virtual Visit via Video Note  I connected with Kathleen Everett on 09/30/23 at 10:40 AM EST by a video enabled telemedicine application and verified that I am speaking with the correct person using two identifiers.  I discussed the limitations of evaluation and management by telemedicine and the availability of in person appointments. The patient expressed understanding and agreed to proceed.  History of Present Illness:   Chief Complaint: OBESITY   History of Present Illness   The patient, with a history of insulin resistance and obesity, presents with worsening back and hip pain that has limited her ability to exercise. She has gained weight since the last visit a month ago. Despite attempts to maintain a food journal with a calorie goal of 1100-1300 calories and 80 or more grams of protein, she has been successful only about 50% of the time. She reports persistent polyphagia despite being on metformin once daily.  The patient also has a known vitamin D deficiency, which is being managed with prescription vitamin D. She has an upcoming appointment with her orthopedic doctor to address the back and hip pain.  In terms of dietary habits, the patient has discussed strategies for holiday eating, specifically for Thanksgiving, and does not anticipate issues with overeating or dealing with leftovers.          PHYSICAL EXAM:  Height 5' 9.5" (1.765 m), weight 219 lb (99.3 kg), last menstrual period 12/08/2006. Body mass index is 31.88 kg/m.  DIAGNOSTIC DATA REVIEWED:  BMET     Component Value Date/Time   NA 140 09/11/2023 0841   NA 142 07/14/2023 0952   NA 144 03/21/2012 0649   K 3.8 09/11/2023 0841   K 3.4 (L) 03/21/2012 0649   CL 104 09/11/2023 0841   CL 109 (H) 03/21/2012 0649   CO2 29 09/11/2023 0841   CO2 27 03/21/2012 0649   GLUCOSE 70 09/11/2023 0841   GLUCOSE 95 03/21/2012 0649   BUN 14 09/11/2023 0841   BUN 13 07/14/2023 0952   BUN 3 (L) 03/21/2012 0649   CREATININE 0.84 09/11/2023 0841   CREATININE 0.76 03/21/2012 0649   CALCIUM 9.4 09/11/2023 0841   CALCIUM 8.2 (L) 03/21/2012 0649   GFRNONAA >60 05/11/2020 1626   GFRNONAA >60 03/21/2012 0649   GFRAA >60 05/11/2020 1626   GFRAA >60 03/21/2012 0649   Lab Results  Component Value Date   HGBA1C 5.1 07/14/2023   HGBA1C 5.7 06/18/2018   Lab Results  Component Value Date   INSULIN 14.5 07/14/2023   Lab Results  Component Value Date   TSH 2.630 07/14/2023   CBC    Component Value Date/Time   WBC 4.9 09/11/2023 0841   RBC 4.58 09/11/2023 0841   HGB 13.5 09/11/2023 0841   HGB 13.7 07/14/2023 0952   HCT 41.5 09/11/2023 0841   HCT 40.8 07/14/2023 0952   PLT 230.0 09/11/2023 0841   PLT 204 07/14/2023 0952   MCV 90.5 09/11/2023 0841   MCV 90 07/14/2023 0952   MCV 89 03/21/2012 0649   MCH 30.0 07/14/2023 0952   MCH 28.3 05/11/2020 1626  MCHC 32.6 09/11/2023 0841   RDW 13.6 09/11/2023 0841   RDW 12.5 07/14/2023 0952   RDW 14.6 (H) 03/21/2012 0649   Iron Studies    Component Value Date/Time   IRON 63 09/11/2023 0841   TIBC 324.8 09/11/2023 0841   FERRITIN 99.1 09/11/2023 0841   IRONPCTSAT 19.4 (L) 09/11/2023 0841   Lipid Panel     Component Value Date/Time   CHOL 278 (H) 07/14/2023 0952   CHOL 235 (H) 03/19/2012 0445   TRIG 227 (H) 07/14/2023 0952   TRIG 258 (H) 03/19/2012 0445   HDL 47 07/14/2023 0952   HDL 35 (L) 03/19/2012 0445   CHOLHDL 5 10/22/2022 0813   VLDL 33.6 10/22/2022 0813   VLDL 52 (H) 03/19/2012 0445   LDLCALC 188 (H) 07/14/2023 0952   LDLCALC 148  (H) 03/19/2012 0445   LDLDIRECT 181.0 06/26/2020 0855   Hepatic Function Panel     Component Value Date/Time   PROT 6.8 07/14/2023 0952   PROT 6.8 03/19/2012 0445   ALBUMIN 4.5 07/14/2023 0952   ALBUMIN 3.3 (L) 03/19/2012 0445   AST 17 07/14/2023 0952   AST 24 03/19/2012 0445   ALT 17 07/14/2023 0952   ALT 28 03/19/2012 0445   ALKPHOS 83 07/14/2023 0952   ALKPHOS 102 03/19/2012 0445   BILITOT 0.2 07/14/2023 0952   BILITOT 0.6 03/19/2012 0445   BILIDIR 0.0 10/22/2022 0813      Component Value Date/Time   TSH 2.630 07/14/2023 0952   Nutritional Lab Results  Component Value Date   VD25OH 33.7 07/14/2023   VD25OH 23.27 (L) 06/18/2018   VD25OH 28.42 (L) 08/01/2016     Assessment and Plan    Back Pain Worsening back and hip pain, limiting exercise. Physical therapy, especially core strengthening, discussed as a potential treatment. Orthopedic consultation scheduled later this week. - Discuss physical therapy with orthopedic doctor - Order physical therapy if recommended  Insulin Resistance Currently on metformin once daily but experiencing polyphagia. Plan to increase metformin to 500 mg BID to address symptoms. Patient agreed. - Increase metformin to 500 mg BID - Continue dietary and exercise regimen  Obesity Weight gain since last visit. Maintaining a food journal with a calorie goal of 1100-1300 calories and 80+ grams of protein, achieving this 50% of the time. Discussed holiday eating strategies for Thanksgiving. - Continue food journal with calorie and protein goals - Follow up in four weeks to assess progress  Vitamin D Deficiency Currently treated with prescription vitamin D. Refill requested. - Refill prescription vitamin D.   follow up in 4 weeks        She was informed of the importance of frequent follow up visits to maximize her success with intensive lifestyle modifications for her multiple health conditions.    Quillian Quince, MD

## 2023-10-04 ENCOUNTER — Encounter: Payer: Self-pay | Admitting: Internal Medicine

## 2023-10-06 MED ORDER — ESOMEPRAZOLE MAGNESIUM 40 MG PO CPDR: 40.0000 mg | DELAYED_RELEASE_CAPSULE | Freq: Every day | ORAL | 3 refills | Status: DC

## 2023-10-09 DIAGNOSIS — H9209 Otalgia, unspecified ear: Secondary | ICD-10-CM | POA: Diagnosis not present

## 2023-10-09 DIAGNOSIS — M26653 Arthropathy of bilateral temporomandibular joint: Secondary | ICD-10-CM | POA: Diagnosis not present

## 2023-10-09 DIAGNOSIS — H903 Sensorineural hearing loss, bilateral: Secondary | ICD-10-CM | POA: Diagnosis not present

## 2023-10-14 DIAGNOSIS — M5412 Radiculopathy, cervical region: Secondary | ICD-10-CM | POA: Diagnosis not present

## 2023-10-14 DIAGNOSIS — M47816 Spondylosis without myelopathy or radiculopathy, lumbar region: Secondary | ICD-10-CM | POA: Diagnosis not present

## 2023-10-14 DIAGNOSIS — M51379 Other intervertebral disc degeneration, lumbosacral region without mention of lumbar back pain or lower extremity pain: Secondary | ICD-10-CM | POA: Diagnosis not present

## 2023-10-14 DIAGNOSIS — M1611 Unilateral primary osteoarthritis, right hip: Secondary | ICD-10-CM | POA: Diagnosis not present

## 2023-10-14 DIAGNOSIS — M160 Bilateral primary osteoarthritis of hip: Secondary | ICD-10-CM | POA: Diagnosis not present

## 2023-10-14 DIAGNOSIS — G894 Chronic pain syndrome: Secondary | ICD-10-CM | POA: Diagnosis not present

## 2023-10-14 DIAGNOSIS — G2581 Restless legs syndrome: Secondary | ICD-10-CM | POA: Diagnosis not present

## 2023-10-14 DIAGNOSIS — M5442 Lumbago with sciatica, left side: Secondary | ICD-10-CM | POA: Diagnosis not present

## 2023-10-14 DIAGNOSIS — M51372 Other intervertebral disc degeneration, lumbosacral region with discogenic back pain and lower extremity pain: Secondary | ICD-10-CM | POA: Diagnosis not present
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
## 2023-10-21 ENCOUNTER — Other Ambulatory Visit: Payer: Self-pay | Admitting: Psychiatry

## 2023-10-23 ENCOUNTER — Encounter: Payer: Self-pay | Admitting: Psychiatry

## 2023-10-23 ENCOUNTER — Telehealth: Payer: Medicare PPO | Admitting: Psychiatry

## 2023-10-23 DIAGNOSIS — M533 Sacrococcygeal disorders, not elsewhere classified: Secondary | ICD-10-CM | POA: Diagnosis not present

## 2023-10-23 DIAGNOSIS — F411 Generalized anxiety disorder: Secondary | ICD-10-CM

## 2023-10-23 DIAGNOSIS — F330 Major depressive disorder, recurrent, mild: Secondary | ICD-10-CM | POA: Diagnosis not present

## 2023-10-23 MED ORDER — BUPROPION HCL ER (XL) 300 MG PO TB24: 300.0000 mg | ORAL_TABLET | Freq: Every day | ORAL | 1 refills | Status: DC

## 2023-10-23 NOTE — Patient Instructions (Signed)
Increase bupropion 300 mg daily Decrease sertraline 150 mg daily Next appointment: 1/31 at 8 am,

## 2023-10-27 ENCOUNTER — Encounter (INDEPENDENT_AMBULATORY_CARE_PROVIDER_SITE_OTHER): Payer: Self-pay | Admitting: Family Medicine

## 2023-10-27 ENCOUNTER — Ambulatory Visit (INDEPENDENT_AMBULATORY_CARE_PROVIDER_SITE_OTHER): Payer: Medicare PPO | Admitting: Family Medicine

## 2023-10-27 VITALS — BP 124/81 | HR 64 | Temp 98.1°F | Ht 69.5 in | Wt 220.0 lb

## 2023-10-27 DIAGNOSIS — E559 Vitamin D deficiency, unspecified: Secondary | ICD-10-CM | POA: Diagnosis not present

## 2023-10-27 DIAGNOSIS — E669 Obesity, unspecified: Secondary | ICD-10-CM

## 2023-10-27 DIAGNOSIS — K5900 Constipation, unspecified: Secondary | ICD-10-CM

## 2023-10-27 DIAGNOSIS — M549 Dorsalgia, unspecified: Secondary | ICD-10-CM | POA: Diagnosis not present

## 2023-10-27 DIAGNOSIS — Z6832 Body mass index (BMI) 32.0-32.9, adult: Secondary | ICD-10-CM

## 2023-10-27 DIAGNOSIS — G8929 Other chronic pain: Secondary | ICD-10-CM | POA: Diagnosis not present

## 2023-10-27 DIAGNOSIS — F3289 Other specified depressive episodes: Secondary | ICD-10-CM | POA: Diagnosis not present

## 2023-10-27 DIAGNOSIS — E88819 Insulin resistance, unspecified: Secondary | ICD-10-CM

## 2023-10-27 DIAGNOSIS — M545 Low back pain, unspecified: Secondary | ICD-10-CM

## 2023-10-27 DIAGNOSIS — F32A Depression, unspecified: Secondary | ICD-10-CM | POA: Insufficient documentation

## 2023-10-27 MED ORDER — METFORMIN HCL 500 MG PO TABS: 500.0000 mg | ORAL_TABLET | Freq: Three times a day (TID) | ORAL | 0 refills | Status: DC

## 2023-10-27 NOTE — Progress Notes (Signed)
.smr  Office: [phone]  /  Fax: [phone]  WEIGHT SUMMARY AND BIOMETRICS  Anthropometric Measurements Height: 5' 9.5" (1.765 m) Weight: 220 lb (99.8 kg) BMI (Calculated): 32.03 Weight at Last Visit: 212 lb Weight Lost Since Last Visit: 0 Weight Gained Since Last Visit: 8 lb Starting Weight: 219 lb Total Weight Loss (lbs): 0 lb (0 kg) Peak Weight: 320 lb   Body Composition  Body Fat %: 44.3 % Fat Mass (lbs): 97.6 lbs Muscle Mass (lbs): 116.4 lbs Total Body Water (lbs): 79.6 lbs Visceral Fat Rating : 11   Other Clinical Data Fasting: no Labs: no Today's Visit #: 7 Starting Date: 07/14/23    Chief Complaint: OBESITY   History of Present Illness   The patient, with a history of obesity, insulin resistance, and back pain, presents with recent weight gain of eight pounds over the past month. She reports emotional eating behaviors and has been inconsistently journaling her food intake. Despite a calorie goal of 1100-1300 and a protein goal of at least 80 grams per day, she has struggled to adhere to these targets.  The patient has been unable to exercise due to back pain, which has been exacerbated by recent prednisone injections for an unspecified condition. She also reports a recent discontinuation of Ozempic, which she believes has contributed to her weight gain.  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 She has been receiving prednisone injections for this condition as well, which she believes has further contributed to her weight gain.  The patient also reports constipation, which she attributes to her vitamin D supplement. She has been managing this with Benefiber and another unspecified tablet.  The patient is currently on metformin twice daily for her insulin resistance. She has been tolerating this medication well, with no reported stomach upset. She also reports taking Vicodin and Xtamp for her pain, but is  considering increasing the dose of Xtamp due to unbearable pain on some days.  The patient expresses frustration and depression over her weight gain and physical limitations, and is concerned about her ability to be active with future grandchildren. She has started drinking protein shakes in the morning in an attempt to improve her diet.          PHYSICAL EXAM:  Blood pressure 124/81, pulse 64, temperature 98.1 F (36.7 C), height 5' 9.5" (1.765 m), weight 220 lb (99.8 kg), last menstrual period 12/08/2006, SpO2 100%. Body mass index is 32.02 kg/m.  DIAGNOSTIC DATA REVIEWED:  BMET    Component Value Date/Time   NA 140 09/11/2023 0841   NA 142 07/14/2023 0952   NA 144 03/21/2012 0649   K 3.8 09/11/2023 0841   K 3.4 (L) 03/21/2012 0649   CL 104 09/11/2023 0841   CL 109 (H) 03/21/2012 0649   CO2 29 09/11/2023 0841   CO2 27 03/21/2012 0649   GLUCOSE 70 09/11/2023 0841   GLUCOSE 95 03/21/2012 0649   BUN 14 09/11/2023 0841   BUN 13 07/14/2023 0952   BUN 3 (L) 03/21/2012 0649   CREATININE 0.84 09/11/2023 0841   CREATININE 0.76 03/21/2012 0649   CALCIUM 9.4 09/11/2023 0841   CALCIUM 8.2 (L) 03/21/2012 0649   GFRNONAA >60 05/11/2020 1626   GFRNONAA >60 03/21/2012 0649   GFRAA >60 05/11/2020 1626   GFRAA >60 03/21/2012 0649   Lab Results  Component Value Date   HGBA1C 5.1 07/14/2023   HGBA1C 5.7 06/18/2018   Lab Results  Component Value Date   INSULIN 14.5 07/14/2023   Lab Results  Component Value Date   TSH 2.630 07/14/2023   CBC    Component Value Date/Time   WBC 4.9 09/11/2023 0841   RBC 4.58 09/11/2023 0841   HGB 13.5 09/11/2023 0841   HGB 13.7 07/14/2023 0952   HCT 41.5 09/11/2023 0841   HCT 40.8 07/14/2023 0952   PLT 230.0 09/11/2023 0841   PLT 204 07/14/2023 0952   MCV 90.5 09/11/2023 0841   MCV 90 07/14/2023 0952   MCV 89 03/21/2012 0649   MCH 30.0 07/14/2023 0952   MCH 28.3 05/11/2020 1626   MCHC 32.6 09/11/2023 0841   RDW 13.6 09/11/2023 0841    RDW 12.5 07/14/2023 0952   RDW 14.6 (H) 03/21/2012 0649   Iron Studies    Component Value Date/Time   IRON 63 09/11/2023 0841   TIBC 324.8 09/11/2023 0841   FERRITIN 99.1 09/11/2023 0841   IRONPCTSAT 19.4 (L) 09/11/2023 0841   Lipid Panel     Component Value Date/Time   CHOL 278 (H) 07/14/2023 0952   CHOL 235 (H) 03/19/2012 0445   TRIG 227 (H) 07/14/2023 0952   TRIG 258 (H) 03/19/2012 0445   HDL 47 07/14/2023 0952   HDL 35 (L) 03/19/2012 0445   CHOLHDL 5 10/22/2022 0813   VLDL 33.6 10/22/2022 0813   VLDL 52 (H) 03/19/2012 0445   LDLCALC 188 (H) 07/14/2023 0952   LDLCALC 148 (H) 03/19/2012 0445   LDLDIRECT 181.0 06/26/2020 0855   Hepatic Function Panel     Component Value Date/Time   PROT 6.8 07/14/2023 0952   PROT 6.8 03/19/2012 0445   ALBUMIN 4.5 07/14/2023 0952   ALBUMIN 3.3 (L) 03/19/2012 0445   AST 17 07/14/2023 0952   AST 24 03/19/2012 0445   ALT 17 07/14/2023 0952   ALT 28 03/19/2012 0445   ALKPHOS 83 07/14/2023 0952   ALKPHOS 102 03/19/2012 0445   BILITOT 0.2 07/14/2023 0952   BILITOT 0.6 03/19/2012 0445   BILIDIR 0.0 10/22/2022 0813      Component Value Date/Time   TSH 2.630 07/14/2023 0952   Nutritional Lab Results  Component Value Date   VD25OH 33.7 07/14/2023   VD25OH 23.27 (L) 06/18/2018   VD25OH 28.42 (L) 08/01/2016     Assessment and Plan    Obesity and Insulin Resistance Obesity with recent 8-pound weight gain over the last month. Contributing factors include cessation of Ozempic, prednisone use, limited exercise due to back pain, and emotional eating. Current calorie goal is 1100-1300 calories/day with a protein goal of at least 80 grams/day. Discussed impact of simple carbohydrates on insulin levels and fat storage, especially with steroid use. Emphasized balanced diet with adequate protein and vegetables. Informed about potential side effects of increased metformin, including loose stools. - Increase metformin to three times a day -  Ensure new prescription is for three times a day - Continue journaling food intake - Monitor weight and dietary intake - Avoid simple carbohydrates, focus on protein and vegetables - Consider protein shakes with a 10:1 calorie to protein ratio and less than 3 grams of sugar per serving  Back Pain Chronic back pain with recent bilateral SI joint injection. Severe pain limits exercise. Numbness and tingling noted, particularly on the right side. Previous MRI indicated a grade five torn disc. Discussed importance of core strengthening exercises to stabilize the back and reduce pain. Informed about potential benefits and limitations of core exercises and possibility of future fusion  surgery with an 8% chance of improvement. - Perform daily core strengthening exercises, starting with seated knee lifts - Consider using a wobble cushion for advanced core exercises - Continue pain management regimen including Vicodin and Xtampza - Follow up with pain management specialist for further evaluation and potential fusion surgery  Constipation and Vit D deficiency Constipation likely secondary to vitamin D supplementation. Benefiber and other fiber supplements are being used to manage symptoms. - Continue Benefiber and other fiber supplements - Monitor bowel movements and adjust fiber intake as needed -Continue vitamin D supplementation  General Health Maintenance Changing insurance to [employer] in January. - Ensure all current healthcare providers accept new insurance  Follow-up - Reschedule appointment to mid-January - Schedule follow-up appointment for mid-February.       She was informed of the importance of frequent follow up visits to maximize her success with intensive lifestyle modifications for her multiple health conditions.    Quillian Quince, MD

## 2023-10-31 ENCOUNTER — Ambulatory Visit (INDEPENDENT_AMBULATORY_CARE_PROVIDER_SITE_OTHER): Payer: Medicare PPO | Admitting: Internal Medicine

## 2023-10-31 VITALS — BP 120/68 | HR 81 | Temp 98.3°F | Ht 69.5 in | Wt 222.4 lb

## 2023-10-31 DIAGNOSIS — F325 Major depressive disorder, single episode, in full remission: Secondary | ICD-10-CM

## 2023-10-31 DIAGNOSIS — K219 Gastro-esophageal reflux disease without esophagitis: Secondary | ICD-10-CM | POA: Diagnosis not present

## 2023-10-31 DIAGNOSIS — N903 Dysplasia of vulva, unspecified: Secondary | ICD-10-CM | POA: Diagnosis not present

## 2023-10-31 DIAGNOSIS — E7800 Pure hypercholesterolemia, unspecified: Secondary | ICD-10-CM | POA: Diagnosis not present

## 2023-10-31 DIAGNOSIS — R739 Hyperglycemia, unspecified: Secondary | ICD-10-CM

## 2023-10-31 DIAGNOSIS — G43809 Other migraine, not intractable, without status migrainosus: Secondary | ICD-10-CM

## 2023-10-31 DIAGNOSIS — R300 Dysuria: Secondary | ICD-10-CM | POA: Diagnosis not present

## 2023-10-31 DIAGNOSIS — M26622 Arthralgia of left temporomandibular joint: Secondary | ICD-10-CM | POA: Diagnosis not present

## 2023-10-31 DIAGNOSIS — R1084 Generalized abdominal pain: Secondary | ICD-10-CM | POA: Diagnosis not present

## 2023-10-31 DIAGNOSIS — N901 Moderate vulvar dysplasia: Secondary | ICD-10-CM

## 2023-10-31 DIAGNOSIS — Q0700 Arnold-Chiari syndrome without spina bifida or hydrocephalus: Secondary | ICD-10-CM

## 2023-10-31 DIAGNOSIS — F411 Generalized anxiety disorder: Secondary | ICD-10-CM

## 2023-10-31 LAB — BASIC METABOLIC PANEL
BUN: 18 mg/dL (ref 6–23)
CO2: 27 meq/L (ref 19–32)
Calcium: 9.1 mg/dL (ref 8.4–10.5)
Chloride: 103 meq/L (ref 96–112)
Creatinine, Ser: 0.83 mg/dL (ref 0.40–1.20)
GFR: 82.24 mL/min (ref >60.00)
Glucose, Bld: 80 mg/dL (ref 70–99)
Potassium: 3.9 meq/L (ref 3.5–5.1)
Sodium: 138 meq/L (ref 135–145)

## 2023-10-31 LAB — URINALYSIS, ROUTINE W REFLEX MICROSCOPIC
Bilirubin Urine: NEGATIVE
Hgb urine dipstick: NEGATIVE
Ketones, ur: NEGATIVE
Leukocytes,Ua: NEGATIVE
Nitrite: NEGATIVE
RBC / HPF: NONE SEEN (ref 0–?)
Specific Gravity, Urine: 1.015 (ref 1.000–1.030)
Total Protein, Urine: NEGATIVE
Urine Glucose: NEGATIVE
Urobilinogen, UA: 0.2 (ref 0.0–1.0)
WBC, UA: NONE SEEN (ref 0–?)
pH: 7 (ref 5.0–8.0)

## 2023-10-31 LAB — LIPASE: Lipase: 51 U/L (ref 11.0–59.0)

## 2023-10-31 LAB — HEPATIC FUNCTION PANEL
ALT: 19 U/L (ref 0–35)
AST: 17 U/L (ref 0–37)
Albumin: 4.5 g/dL (ref 3.5–5.2)
Alkaline Phosphatase: 76 U/L (ref 39–117)
Bilirubin, Direct: 0.1 mg/dL (ref 0.0–0.3)
Total Bilirubin: 0.4 mg/dL (ref 0.2–1.2)
Total Protein: 7 g/dL (ref 6.0–8.3)

## 2023-10-31 LAB — LIPID PANEL
Cholesterol: 222 mg/dL — ABNORMAL HIGH (ref 0–200)
HDL: 62.8 mg/dL (ref >39.00)
LDL Cholesterol: 144 mg/dL — ABNORMAL HIGH (ref 0–99)
NonHDL: 159.67
Total CHOL/HDL Ratio: 4
Triglycerides: 79 mg/dL (ref 0.0–149.0)
VLDL: 15.8 mg/dL (ref 0.0–40.0)

## 2023-10-31 MED ORDER — AZELASTINE HCL 0.1 % NA SOLN: 1.0000 | Freq: Two times a day (BID) | NASAL | 2 refills | Status: AC

## 2023-10-31 MED ORDER — ESOMEPRAZOLE MAGNESIUM 40 MG PO CPDR: 40.0000 mg | DELAYED_RELEASE_CAPSULE | Freq: Two times a day (BID) | ORAL | 3 refills | Status: DC

## 2023-10-31 MED ORDER — ATORVASTATIN CALCIUM 10 MG PO TABS: 10.0000 mg | ORAL_TABLET | Freq: Every day | ORAL | 3 refills | Status: DC

## 2023-10-31 NOTE — Progress Notes (Unsigned)
Subjective:    Patient ID: Kathleen Everett, female    DOB: 10/07/1971, 52 y.o.   MRN: [id number]  Patient here for  Chief Complaint  Patient presents with   Medical Management of Chronic Issues    3 month f/u    HPI Here for a scheduled follow up. Followed by neurology for migraines - ubrelvy, aimovig and rizatriptan.  Seeing psychiatry for depression.  On zoloft. 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  Prescribed aldara. Last visit, discussed cholesterol and intolerance to statin medication. Had recommended repatha. Something happened with getting the prescription. Never took repatha and is now taking atorvastatin.  Being followed by [facility].  Last seen 10/27/23 - metformin recommended to increase to tid. Has not started tid dosing yet. Also being followed by psychiatry. Recent increase in bupropion - 300mg  q day. Decrease sertraline to 150mg  q day. Is followed for chronic back pain. Last evaluated 10/14/23 - pending spinal cord stimulator trial. Also planning bilateral SI joint injection. Reports did not help. Has f/u next week with ortho. Seeing oral surgeon for TMJ.  S/p botulinum toxin injection 08/14/23. Reports some throat congestion. Request refill astelin.  Helped congestion.    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   Endometriosis    Generalized anxiety disorder 05/01/2015   Has previously seen psychiatry. Having some issues with what she feels is related to stress and anxiety   GERD (gastroesophageal reflux disease)    Hemorrhoids 12/23/2020   Hidradenitis    High blood pressure    History of ovarian cyst    Hot flashes 03/12/2015   Hypercholesteremia 01/10/2014   Off lipitor. Elevated cholesterol. Labs discussed. Restart lipitor. Follow lipid panel and liver function tests.   Hyperglycemia 06/24/2019   IBS (irritable bowel syndrome)    Joint pain    Major depressive disorder 05/01/2015   Migraine headaches 01/10/2014   Has seen neurology. Topamax.   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 Level is 21.6   Past Surgical History:  Procedure Laterality Date   ABDOMINAL HYSTERECTOMY  03/27/07  laparoscopic   BREAST EXCISIONAL BIOPSY Left 2004   fibroadenoma   CESAREAN SECTION     1999/2004 twins   chiari decompression     COLONOSCOPY WITH PROPOFOL N/A 04/28/2015   Procedure: COLONOSCOPY WITH PROPOFOL;  Surgeon: Scot Jun, MD;  Location: [facility]COPY;  Service: Endoscopy;  Laterality: N/A;   COLONOSCOPY WITH PROPOFOL N/A 03/19/2021   Procedure: COLONOSCOPY WITH PROPOFOL;  Surgeon: Regis Bill, MD;  Location: [facility] ENDOSCOPY;  Service: Endoscopy;  Laterality: N/A;   COLPOSCOPY N/A 04/24/2022   Procedure:  COLPOSCOPY, ABLATION OF VULVAR TISSUE;  Surgeon: Artelia Laroche, MD;  Location: [facility] ORS;  Service: Gynecology;  Laterality: N/A;   CYSTOSCOPY     bladder ulcers   ESOPHAGOGASTRODUODENOSCOPY N/A 04/28/2015   Procedure: ESOPHAGOGASTRODUODENOSCOPY (EGD);  Surgeon: Scot Jun, MD;  Location: [facility] ENDOSCOPY;  Service: Endoscopy;  Laterality: N/A;   ESOPHAGOGASTRODUODENOSCOPY (EGD) WITH PROPOFOL N/A 03/19/2021   Procedure: ESOPHAGOGASTRODUODENOSCOPY (EGD) WITH PROPOFOL;  Surgeon: Regis Bill, MD;  Location: [facility] ENDOSCOPY;  Service: Endoscopy;  Laterality: N/A;   HERNIA REPAIR Right 2003   inguinal   INCISION AND DRAINAGE HIP Right 05/11/2020   Procedure: IRRIGATION AND DEBRIDEMENT HIP;  Surgeon: Kennedy Bucker, MD;  Location: [facility] ORS;  Service: Orthopedics;  Laterality: Right;   OVARIAN CYST REMOVAL  2001   right   TEMPOROMANDIBULAR JOINT SURGERY  2000   TOTAL HIP ARTHROPLASTY Right 12/28/2019   Procedure: TOTAL HIP ARTHROPLASTY ANTERIOR APPROACH;  Surgeon: Kennedy Bucker, MD;  Location: [facility] ORS;  Service: Orthopedics;  Laterality: Right;   TUBAL LIGATION     Bilateral   VULVA /PERINEUM BIOPSY  04/24/2022   Procedure: VULVAR BIOPSY;  Surgeon: Artelia Laroche, MD;  Location: [facility] ORS;  Service: Gynecology;;   Family History  Problem Relation Age of Onset   Hyperlipidemia Mother    Anxiety disorder Mother    Atrial fibrillation Mother    Hypertension Mother    Memory loss Mother    Anxiety disorder Father    Stroke Father    Hyperlipidemia Father    Hypertension Father    Congestive Heart Failure Father    Depression Father    Hyperlipidemia Brother    Colon cancer Paternal Aunt    Dementia Paternal Aunt    Dementia Maternal Grandmother    Cancer Maternal Grandmother        unk primary; d. late 74s   Dementia Paternal Grandmother    ADD / ADHD Daughter    Breast cancer Neg Hx    Social History   Socioeconomic History   Marital status: Married    Spouse  name: Thayer Ohm   Number of children: 3   Years of education: 13   Highest education level: Some college, no degree  Occupational History   Occupation: Disability  Tobacco Use   Smoking status: Never   Smokeless tobacco: Never  Vaping Use   Vaping status: Never Used  Substance and Sexual Activity   Alcohol use: Not Currently    Alcohol/week: 0.0 standard drinks of alcohol   Drug use: No   Sexual activity: Yes    Birth control/protection: Surgical    Comment: Hysterectomy  Other Topics Concern   Not on file  Social History Narrative   Patient drinks 2-3 cups of caffeine daily.   Patient is right handed.       Social Hx:   Current living situation- [place]. Living with husband and 3 kids   Born and Raised- [place].raised by both  parents.    Siblings- 1 brother who is 7 yrs older than her   Legal issues- none   Married for 24 yrs. Currently unemployed since 2011 when she hurt her back. HS grad and CNA and phlebotomy.    Social Drivers of Health   Financial Resource Strain: Medium Risk (04/20/2023)   Received from [facility], [facility]   Overall Financial Resource Strain (CARDIA)    Difficulty of Paying Living Expenses: Somewhat hard  Food Insecurity: Food Insecurity Present (04/20/2023)   Received from [facility], [facility]   Hunger Vital Sign    Worried About Running Out of Food in the Last Year: Sometimes true    Ran Out of Food in the Last Year: Sometimes true  Transportation Needs: No Transportation Needs (04/20/2023)   Received from [facility], [facility]   PRAPARE - Transportation    Lack of Transportation (Medical): No    Lack of Transportation (Non-Medical): No  Physical Activity: Unknown (04/20/2023)   Received from [facility], [facility]   Exercise Vital Sign    Days of Exercise per Week: 0 days    Minutes of Exercise per Session: Not on file  Recent Concern: Physical Activity - Insufficiently Active (03/14/2023)   Exercise Vital Sign    Days of  Exercise per Week: 7 days    Minutes of Exercise per Session: 10 min  Stress: Stress Concern Present (04/20/2023)   Received from [facility], [facility] of Occupational [facility] - Occupational Stress Questionnaire    Feeling of Stress : Very much  Social Connections: Socially Isolated (04/20/2023)   Received from [facility], [facility]   Social Network    How would you rate your social network (family, work, friends)?: Little participation, lonely and socially isolated     Review of Systems  Constitutional:  Negative for appetite change and unexpected weight change.  HENT:  Negative for sinus pressure.        Some throat congestion as outlined.   Respiratory:  Negative for cough, chest tightness and shortness of breath.   Cardiovascular:  Negative for chest pain and palpitations.  Gastrointestinal:  Negative for abdominal pain, diarrhea, nausea and vomiting.  Genitourinary:  Negative for difficulty urinating and dysuria.  Musculoskeletal:  Positive for back pain. Negative for joint swelling.  Skin:  Negative for color change and rash.  Neurological:  Negative for dizziness and headaches.  Psychiatric/Behavioral:  Negative for agitation and dysphoric mood.        Objective:     BP 120/68   Pulse 81   Temp 98.3 F (36.8 C)   Ht 5' 9.5" (1.765 m)   Wt 222 lb 6.4 oz (100.9 kg)   LMP 12/08/2006   SpO2 98%   BMI 32.37 kg/m  Wt Readings from Last 3 Encounters:  10/31/23 222 lb 6.4 oz (100.9 kg)  10/27/23 220 lb (99.8 kg)  09/30/23 219 lb (99.3 kg)    Physical Exam Vitals reviewed.  Constitutional:      General: She is not in acute distress.    Appearance: Normal appearance.  HENT:     Head: Normocephalic and atraumatic.     Right Ear: Tympanic membrane, ear canal and external ear normal.     Left Ear: Tympanic membrane, ear canal and external ear normal.     Mouth/Throat:     Pharynx: No oropharyngeal exudate or posterior oropharyngeal  erythema.  Eyes:     General: No scleral icterus.  Right eye: No discharge.        Left eye: No discharge.     Conjunctiva/sclera: Conjunctivae normal.  Neck:     Thyroid: No thyromegaly.  Cardiovascular:     Rate and Rhythm: Normal rate and regular rhythm.  Pulmonary:     Effort: No respiratory distress.     Breath sounds: Normal breath sounds. No wheezing.  Abdominal:     General: Bowel sounds are normal.     Palpations: Abdomen is soft.     Tenderness: There is no abdominal tenderness.  Musculoskeletal:        General: No swelling or tenderness.     Cervical back: Neck supple. No tenderness.  Lymphadenopathy:     Cervical: No cervical adenopathy.  Skin:    Findings: No erythema or rash.  Neurological:     Mental Status: She is alert.  Psychiatric:        Mood and Affect: Mood normal.        Behavior: Behavior normal.      Outpatient Encounter Medications as of 10/31/2023  Medication Sig   atorvastatin (LIPITOR) 10 MG tablet Take 1 tablet (10 mg total) by mouth daily.   azelastine (ASTELIN) 0.1 % nasal spray Place 1 spray into both nostrils 2 (two) times daily. Use in each nostril as directed   acetaminophen (TYLENOL) 325 MG tablet Take 500 mg by mouth every 6 (six) hours as needed.   AIMOVIG 140 MG/ML SOAJ Inject 140 mg into the skin every 28 (twenty-eight) days.   buPROPion (WELLBUTRIN XL) 300 MG 24 hr tablet Take 1 tablet (300 mg total) by mouth daily.   Crisaborole (EUCRISA) 2 % OINT Apply twice a day to hands prn flares   cyanocobalamin (VITAMIN B12) 1000 MCG/ML injection INJECT 1 ML INTRAMUSCULARLY  ONCE EVERY MONTH   diclofenac Sodium (VOLTAREN) 1 % GEL SMARTSIG:Gram(s) Topical 4 Times Daily PRN   esomeprazole (NEXIUM) 40 MG capsule Take 1 capsule (40 mg total) by mouth 2 (two) times daily before a meal.   estradiol (ESTRACE) 0.1 MG/GM vaginal cream Place 1 Applicatorful vaginally 4 (four) times a week. At bedtime   HYDROcodone-acetaminophen (NORCO) 10-325  MG tablet Take 1 tablet by mouth every 6 (six) hours as needed.   metFORMIN (GLUCOPHAGE) 500 MG tablet Take 1 tablet (500 mg total) by mouth 3 (three) times daily. (Patient not taking: Reported on 10/31/2023)   methocarbamol (ROBAXIN) 500 MG tablet Take 500 mg by mouth 4 (four) times daily.   rizatriptan (MAXALT) 10 MG tablet Take 10 mg by mouth as needed for migraine. May repeat in 2 hours if needed   sertraline (ZOLOFT) 100 MG tablet Take 2 tablets (200 mg total) by mouth daily. (Patient taking differently: Take 150 mg by mouth at bedtime.)   topiramate (TOPAMAX) 100 MG tablet Take 100 mg by mouth at bedtime.   Ubrogepant (UBRELVY) 100 MG TABS Take 100 mg by mouth 2 (two) times daily as needed.   Vitamin D, Ergocalciferol, (DRISDOL) 1.25 MG (50000 UNIT) CAPS capsule Take 1 capsule (50,000 Units total) by mouth every 7 (seven) days.   XTAMPZA ER 9 MG C12A Take 1 capsule by mouth 2 (two) times daily.   [DISCONTINUED] esomeprazole (NEXIUM) 40 MG capsule Take 1 capsule (40 mg total) by mouth daily.   No facility-administered encounter medications on file as of 10/31/2023.     Lab Results  Component Value Date   WBC 4.9 09/11/2023   HGB 13.5 09/11/2023   HCT 41.5  09/11/2023   PLT 230.0 09/11/2023   GLUCOSE 80 10/31/2023   CHOL 222 (H) 10/31/2023   TRIG 79.0 10/31/2023   HDL 62.80 10/31/2023   LDLDIRECT 181.0 06/26/2020   LDLCALC 144 (H) 10/31/2023   ALT 19 10/31/2023   AST 17 10/31/2023   NA 138 10/31/2023   K 3.9 10/31/2023   CL 103 10/31/2023   CREATININE 0.83 10/31/2023   BUN 18 10/31/2023   CO2 27 10/31/2023   TSH 2.630 07/14/2023   INR 1.1 12/03/2019   HGBA1C 5.1 07/14/2023    MM 3D SCREENING MAMMOGRAM BILATERAL BREAST Result Date: 09/12/2023 CLINICAL DATA:  Screening. EXAM: DIGITAL SCREENING BILATERAL MAMMOGRAM WITH TOMOSYNTHESIS AND CAD TECHNIQUE: Bilateral screening digital craniocaudal and mediolateral oblique mammograms were obtained. Bilateral screening digital breast  tomosynthesis was performed. The images were evaluated with computer-aided detection. COMPARISON:  Previous exam(s). ACR Breast Density Category a: The breasts are almost entirely fatty. FINDINGS: There are no findings suspicious for malignancy. IMPRESSION: No mammographic evidence of malignancy. A result letter of this screening mammogram will be mailed directly to the patient. RECOMMENDATION: Screening mammogram in one year. (Code:SM-B-01Y) BI-RADS CATEGORY  1: Negative. Electronically Signed   By: Frederico Hamman M.D.   On: 09/12/2023 07:29       Assessment & Plan:  Hypercholesterolemia Assessment & Plan: On atorvastatin. Tolerating. Low cholesterol diet and exercise.  Follow lipid panel and liver function tests.   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  Prescribed aldara.    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  Prescribed aldara.    Arthralgia of left temporomandibular joint Assessment & Plan: Seeing oral surgeon for TMJ.  S/p botulinum toxin injection 08/14/23.    Other migraine without status migrainosus, not intractable Assessment & Plan: Seeing neurology for migraines - ubrelvy, aimovig and rizatriptan.   Last evaluated 11/25/22.  Taking topamax bid.   Hyperglycemia Assessment & Plan: Low carb diet and exercise.  Follow met b and a1c.    Gastroesophageal reflux disease, unspecified whether esophagitis present Assessment & Plan: Continue nexium.    Generalized anxiety disorder Assessment & Plan: Seeing psychiatry.  On zoloft - decreased dose to 150mg  and increased wellbutrin to 300mg  q day.  Follow.     Depression, major, in remission [facility]) Assessment & Plan: Followed by psychiatry.  On zoloft and wellbutrin as outlined.     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  Followed by neurology.    Other orders -     Atorvastatin Calcium; Take 1 tablet (10 mg total) by mouth daily.  Dispense: 90 tablet; Refill: 3 -     Esomeprazole Magnesium; Take 1 capsule (40 mg total) by mouth 2 (two) times daily before a meal.  Dispense: 90 capsule; Refill: 3 -     Azelastine HCl; Place 1 spray into both nostrils 2 (two) times daily. Use in each nostril as directed  Dispense: 30 mL; Refill: 2     [place], MD

## 2023-11-01 ENCOUNTER — Encounter: Payer: Self-pay | Admitting: Internal Medicine

## 2023-11-01 LAB — URINE CULTURE
MICRO NUMBER:: 15850079
Result:: NO GROWTH
SPECIMEN QUALITY:: ADEQUATE

## 2023-11-01 NOTE — Assessment & Plan Note (Signed)
Seeing psychiatry.  On zoloft - decreased dose to 150mg  and increased wellbutrin to 300mg  q day.  Follow.

## 2023-11-01 NOTE — Assessment & Plan Note (Signed)
S/p surgery.  Followed by neurology.  

## 2023-11-01 NOTE — Assessment & Plan Note (Signed)
Continue nexium 

## 2023-11-01 NOTE — Assessment & Plan Note (Signed)
Seeing neurology for migraines - ubrelvy, aimovig and rizatriptan.   Last evaluated 11/25/22.  Taking topamax bid.

## 2023-11-01 NOTE — Assessment & Plan Note (Signed)
Low carb diet and exercise.  Follow met b and a1c.   

## 2023-11-01 NOTE — Assessment & Plan Note (Signed)
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  Prescribed aldara.

## 2023-11-01 NOTE — Assessment & Plan Note (Signed)
Followed by psychiatry.  On zoloft and wellbutrin as outlined.

## 2023-11-01 NOTE — Assessment & Plan Note (Signed)
On atorvastatin. Tolerating. Low cholesterol diet and exercise.  Follow lipid panel and liver function tests.

## 2023-11-01 NOTE — Assessment & Plan Note (Signed)
Seeing oral surgeon for TMJ.  S/p botulinum toxin injection 08/14/23.

## 2023-11-03 ENCOUNTER — Other Ambulatory Visit: Payer: Self-pay

## 2023-11-03 MED ORDER — ATORVASTATIN CALCIUM 20 MG PO TABS: 20.0000 mg | ORAL_TABLET | Freq: Every day | ORAL | 1 refills | Status: DC

## 2023-11-05 DIAGNOSIS — M1612 Unilateral primary osteoarthritis, left hip: Secondary | ICD-10-CM | POA: Diagnosis not present

## 2023-11-05 DIAGNOSIS — M7062 Trochanteric bursitis, left hip: Secondary | ICD-10-CM | POA: Diagnosis not present

## 2023-11-11 ENCOUNTER — Encounter: Payer: Self-pay | Admitting: Internal Medicine

## 2023-11-11 ENCOUNTER — Telehealth: Payer: Medicare PPO | Admitting: Nurse Practitioner

## 2023-11-11 VITALS — Ht 69.5 in | Wt 222.0 lb

## 2023-11-11 DIAGNOSIS — J0100 Acute maxillary sinusitis, unspecified: Secondary | ICD-10-CM | POA: Diagnosis not present

## 2023-11-11 MED ORDER — DOXYCYCLINE HYCLATE 100 MG PO TABS: 100.0000 mg | ORAL_TABLET | Freq: Two times a day (BID) | ORAL | 0 refills | Status: DC

## 2023-11-11 MED ORDER — PSEUDOEPH-BROMPHEN-DM 30-2-10 MG/5ML PO SYRP: 5.0000 mL | ORAL_SOLUTION | Freq: Four times a day (QID) | ORAL | 0 refills | Status: DC | PRN

## 2023-11-11 NOTE — Progress Notes (Signed)
MyChart Video Visit    Virtual Visit via Video Note   This visit type was conducted because this format is felt to be most appropriate for this patient at this time. Physical exam was limited by quality of the video and audio technology used for the visit. CMA was able to get the patient set up on a video visit.  Patient location: Home. Patient and provider in visit Provider location: Office  I discussed the limitations of evaluation and management by telemedicine and the availability of in person appointments. The patient expressed understanding and agreed to proceed.  Visit Date: 11/11/2023  Today's healthcare provider: Bethanie Dicker, NP     Subjective:    Patient ID: Kathleen Everett, female    DOB: May 11, 1971, 52 y.o.   MRN: [id number]  Chief Complaint  Patient presents with   Sinus Problem    Sx's started couple weeks ago.   Nasal congestion, headaches, feels like have mucus in throat, cough    HPI  Discussed the use of AI scribe software for clinical note transcription with the patient, who gave verbal consent to proceed.  History of Present Illness   The patient, with a history of chronic pain, has been experiencing persistent sinus symptoms for approximately three to four weeks. She reports significant nasal congestion and postnasal drip, leading to a sensation of 'stuff' stuck in the throat upon swallowing. The nasal discharge alternates between clear and yellow-tinged. Accompanying these symptoms are a sore throat and intermittent cough, which is sometimes productive but not consistently. The patient also reports headaches, described as pressure-like, predominantly focused around the nasal area.  The patient has been using azelastine nasal spray and over-the-counter sinus medication, but these have not provided significant relief. The coughing is reported to be worse at night. The patient denies any shortness of breath, fevers, nausea, or vomiting. Chronic pain  is a constant feature due to an existing condition.  The patient has no known recent exposures to infectious diseases such as COVID-19 or influenza. She has a history of allergies to penicillins and sulfas but has previously tolerated azithromycin and doxycycline.      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   Endometriosis    Generalized anxiety disorder 05/01/2015   Has previously seen psychiatry. Having some issues with what she feels is related to stress and anxiety   GERD (gastroesophageal reflux disease)    Hemorrhoids 12/23/2020   Hidradenitis    High blood pressure    History of ovarian cyst    Hot flashes 03/12/2015   Hypercholesteremia 01/10/2014   Off lipitor. Elevated cholesterol. Labs discussed. Restart lipitor. Follow lipid panel and liver function tests.   Hyperglycemia 06/24/2019   IBS (irritable bowel syndrome)    Joint pain    Major  depressive disorder 05/01/2015   Migraine headaches 01/10/2014   Has seen neurology. Topamax.   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 Level is 21.6    Past Surgical History:  Procedure Laterality Date   ABDOMINAL HYSTERECTOMY  03/27/07   laparoscopic   BREAST EXCISIONAL BIOPSY Left 2004   fibroadenoma   CESAREAN SECTION     1999/2004 twins   chiari decompression     COLONOSCOPY WITH PROPOFOL N/A 04/28/2015   Procedure: COLONOSCOPY WITH PROPOFOL;  Surgeon: Scot Jun, MD;  Location: [facility] ENDOSCOPY;  Service: Endoscopy;  Laterality: N/A;   COLONOSCOPY WITH PROPOFOL N/A 03/19/2021   Procedure: COLONOSCOPY WITH PROPOFOL;  Surgeon: Regis Bill, MD;  Location: [facility] ENDOSCOPY;  Service: Endoscopy;  Laterality: N/A;   COLPOSCOPY N/A 04/24/2022   Procedure: COLPOSCOPY, ABLATION OF VULVAR TISSUE;  Surgeon: Artelia Laroche, MD;  Location: [facility] ORS;  Service: Gynecology;  Laterality: N/A;   CYSTOSCOPY     bladder ulcers   ESOPHAGOGASTRODUODENOSCOPY N/A 04/28/2015   Procedure: ESOPHAGOGASTRODUODENOSCOPY (EGD);  Surgeon: Scot Jun, MD;  Location: [facility] ENDOSCOPY;  Service: Endoscopy;  Laterality: N/A;   ESOPHAGOGASTRODUODENOSCOPY (EGD) WITH PROPOFOL N/A 03/19/2021   Procedure: ESOPHAGOGASTRODUODENOSCOPY (EGD) WITH PROPOFOL;  Surgeon: Regis Bill, MD;  Location: [facility] ENDOSCOPY;  Service: Endoscopy;  Laterality: N/A;   HERNIA REPAIR Right 2003   inguinal   INCISION AND DRAINAGE HIP Right 05/11/2020   Procedure: IRRIGATION AND DEBRIDEMENT HIP;  Surgeon: Kennedy Bucker, MD;  Location: [facility] ORS;  Service: Orthopedics;  Laterality: Right;   OVARIAN CYST REMOVAL  2001   right   TEMPOROMANDIBULAR JOINT SURGERY  2000   TOTAL HIP ARTHROPLASTY Right 12/28/2019   Procedure: TOTAL HIP ARTHROPLASTY ANTERIOR APPROACH;  Surgeon: Kennedy Bucker, MD;  Location: [facility] ORS;  Service: Orthopedics;  Laterality: Right;   TUBAL LIGATION     Bilateral   VULVA /PERINEUM BIOPSY  04/24/2022   Procedure: VULVAR BIOPSY;  Surgeon: Artelia Laroche, MD;  Location: [facility] ORS;  Service: Gynecology;;    Family History  Problem Relation Age of Onset   Hyperlipidemia Mother    Anxiety disorder Mother    Atrial fibrillation Mother    Hypertension Mother    Memory loss Mother    Anxiety disorder Father    Stroke Father    Hyperlipidemia Father    Hypertension Father    Congestive Heart Failure Father    Depression Father    Hyperlipidemia Brother    Colon cancer Paternal Aunt    Dementia Paternal Aunt    Dementia Maternal Grandmother    Cancer Maternal Grandmother        unk primary; d. late 66s   Dementia Paternal Grandmother    ADD / ADHD Daughter    Breast cancer Neg Hx     Social History  Socioeconomic History   Marital status: Married    Spouse name: Thayer Ohm   Number of children: 3   Years of education: 13   Highest education level: Some college, no degree  Occupational History   Occupation: Disability  Tobacco Use   Smoking status: Never   Smokeless tobacco: Never  Vaping Use   Vaping status: Never Used  Substance and Sexual Activity   Alcohol use: Not Currently    Alcohol/week: 0.0 standard drinks of alcohol   Drug use: No   Sexual activity: Yes    Birth control/protection: Surgical    Comment: Hysterectomy  Other Topics Concern   Not on file  Social History Narrative   Patient drinks 2-3 cups of caffeine daily.   Patient is right handed.       Social Hx:   Current living situation- [place]. Living with husband and 3 kids   Born [place] Raised- [place]raised by both parents.    Siblings- 1 brother who is 7 yrs older than her   Legal issues- none   Married for 24 yrs. Currently unemployed since 2011 when she hurt her back. HS grad and CNA and phlebotomy.    Social Drivers of Health   Financial Resource  Strain: Medium Risk (04/20/2023)   Received from [facility], [facility]   Overall Financial Resource Strain (CARDIA)    Difficulty of Paying Living Expenses: Somewhat hard  Food Insecurity: Food Insecurity Present (04/20/2023)   Received from [facility], [facility]   Hunger Vital Sign    Worried About Running Out of Food in the Last Year: Sometimes true    Ran Out of Food in the Last Year: Sometimes true  Transportation Needs: No Transportation Needs (04/20/2023)   Received from [facility], [facility]   PRAPARE - Transportation    Lack of Transportation (Medical): No    Lack of Transportation (Non-Medical): No  Physical Activity: Unknown (04/20/2023)   Received from [facility], [facility]   Exercise Vital Sign    Days of Exercise per Week: 0 days    Minutes of Exercise per Session: Not on file  Recent Concern: Physical Activity - Insufficiently Active (03/14/2023)   Exercise Vital Sign    Days of Exercise per Week: 7 days    Minutes of Exercise per Session: 10 min  Stress: Stress Concern Present (04/20/2023)   Received from [facility], [facility] - Occupational Stress Questionnaire    Feeling of Stress : Very much  Social Connections: Socially Isolated (04/20/2023)   Received from [facility], [facility]   Social Network    How would you rate your social network (family, work, friends)?: Little participation, lonely and socially isolated  Intimate Partner Violence: Not At Risk (04/20/2023)   Received from [facility], [facility]   HITS    Over the last 12 months how often did your partner physically hurt you?: Never    Over the last 12 months how often did your partner insult you or talk down to you?: Never    Over the last 12 months how often did your partner threaten you with physical harm?: Never    Over the last 12 months how often did your partner scream or curse at you?: Never    Outpatient Medications  Prior to Visit  Medication Sig Dispense Refill   acetaminophen (TYLENOL) 325 MG tablet Take 500 mg by mouth every 6 (six) hours as needed.  AIMOVIG 140 MG/ML SOAJ Inject 140 mg into the skin every 28 (twenty-eight) days.     atorvastatin (LIPITOR) 20 MG tablet Take 1 tablet (20 mg total) by mouth daily. 90 tablet 1   azelastine (ASTELIN) 0.1 % nasal spray Place 1 spray into both nostrils 2 (two) times daily. Use in each nostril as directed 30 mL 2   buPROPion (WELLBUTRIN XL) 300 MG 24 hr tablet Take 1 tablet (300 mg total) by mouth daily. 30 tablet 1   Crisaborole (EUCRISA) 2 % OINT Apply twice a day to hands prn flares 60 g 2   cyanocobalamin (VITAMIN B12) 1000 MCG/ML injection INJECT 1 ML INTRAMUSCULARLY  ONCE EVERY MONTH 10 mL 0   diclofenac Sodium (VOLTAREN) 1 % GEL SMARTSIG:Gram(s) Topical 4 Times Daily PRN     esomeprazole (NEXIUM) 40 MG capsule Take 1 capsule (40 mg total) by mouth 2 (two) times daily before a meal. 90 capsule 3   estradiol (ESTRACE) 0.1 MG/GM vaginal cream Place 1 Applicatorful vaginally 4 (four) times a week. At bedtime 42.5 g 0   HYDROcodone-acetaminophen (NORCO) 10-325 MG tablet Take 1 tablet by mouth every 6 (six) hours as needed.     metFORMIN (GLUCOPHAGE) 500 MG tablet Take 1 tablet (500 mg total) by mouth 3 (three) times daily. (Patient not taking: Reported on 10/31/2023) 90 tablet 0   methocarbamol (ROBAXIN) 500 MG tablet Take 500 mg by mouth 4 (four) times daily.     rizatriptan (MAXALT) 10 MG tablet Take 10 mg by mouth as needed for migraine. May repeat in 2 hours if needed     sertraline (ZOLOFT) 100 MG tablet Take 2 tablets (200 mg total) by mouth daily. (Patient taking differently: Take 150 mg by mouth at bedtime.) 180 tablet 0   topiramate (TOPAMAX) 100 MG tablet Take 100 mg by mouth at bedtime.     Ubrogepant (UBRELVY) 100 MG TABS Take 100 mg by mouth 2 (two) times daily as needed.     Vitamin D, Ergocalciferol, (DRISDOL) 1.25 MG (50000 UNIT) CAPS capsule  Take 1 capsule (50,000 Units total) by mouth every 7 (seven) days. 4 capsule 0   XTAMPZA ER 9 MG C12A Take 1 capsule by mouth 2 (two) times daily.     No facility-administered medications prior to visit.    Allergies  Allergen Reactions   Vancomycin Hives   Ceftin [Cefuroxime Axetil] Other (See Comments)    Upset stomach   Celexa [Citalopram Hydrobromide] Diarrhea    GI upset     Clindamycin/Lincomycin Rash   Penicillins Rash    Did it involve swelling of the face/tongue/throat, SOB, or low BP? No Did it involve sudden or severe rash/hives, skin peeling, or any reaction on the inside of your mouth or nose? Unknown Did you need to seek medical attention at a hospital or doctor's office? No When did it last happen? childhood reaction If all above answers are "NO", may proceed with cephalosporin use.    Sulfa Antibiotics Rash    ROS See HPI    Objective:    Physical Exam  Ht 5' 9.5" (1.765 m)   Wt 222 lb (100.7 kg)   LMP 12/08/2006   BMI 32.31 kg/m  Wt Readings from Last 3 Encounters:  11/11/23 222 lb (100.7 kg)  10/31/23 222 lb 6.4 oz (100.9 kg)  10/27/23 220 lb (99.8 kg)   GENERAL: alert, oriented, appears well and in no acute distress   HEENT: atraumatic, conjunttiva clear, no obvious abnormalities on inspection  of external nose and ears   NECK: normal movements of the head and neck   LUNGS: on inspection no signs of respiratory distress, breathing rate appears normal, no obvious gross SOB, gasping or wheezing   CV: no obvious cyanosis   MS: moves all visible extremities without noticeable abnormality   PSYCH/NEURO: pleasant and cooperative, no obvious depression or anxiety, speech and thought processing grossly intact     Assessment & Plan:   Problem List Items Addressed This Visit       Respiratory   Acute non-recurrent maxillary sinusitis - Primary   She exhibits persistent sinus symptoms including nasal congestion, postnasal drip, and intermittent  yellowish nasal discharge for 3-4 weeks, indicating a likely sinus infection, especially given no improvement with azelastine nasal spray and over-the-counter sinus medication. We will start Doxycycline twice daily and continue azelastine nasal spray. She reports a nighttime cough, sometimes productive, without chest congestion or shortness of breath. We will prescribe Bromfed DM cough syrup, 5-33ml as needed, advising a higher dose at bedtime due to potential drowsiness. We advise over-the-counter Tylenol or ibuprofen as needed for headache relief. Should symptoms persist or worsen, she is to return for an in-person evaluation.       Relevant Medications   doxycycline (VIBRA-TABS) 100 MG tablet   brompheniramine-pseudoephedrine-DM 30-2-10 MG/5ML syrup    I am having Kathleen A. Hern "Noreene Olivia" start on doxycycline and brompheniramine-pseudoephedrine-DM. I am also having her maintain her rizatriptan, Aimovig, methocarbamol, Ubrelvy, acetaminophen, diclofenac Sodium, topiramate, cyanocobalamin, Xtampza ER, HYDROcodone-acetaminophen, Eucrisa, sertraline, estradiol, Vitamin D (Ergocalciferol), buPROPion, metFORMIN, esomeprazole, azelastine, and atorvastatin.  Meds ordered this encounter  Medications   doxycycline (VIBRA-TABS) 100 MG tablet    Sig: Take 1 tablet (100 mg total) by mouth 2 (two) times daily.    Dispense:  14 tablet    Refill:  0    Supervising Provider:   Birdie Sons, ERIC G [[id number]]   brompheniramine-pseudoephedrine-DM 30-2-10 MG/5ML syrup    Sig: Take 5-10 mLs by mouth every 6 (six) hours as needed.    Dispense:  120 mL    Refill:  0    Supervising Provider:   Birdie Sons, ERIC G [[id number]]    I discussed the assessment and treatment plan with the patient. The patient was provided an opportunity to ask questions and all were answered. The patient agreed with the plan and demonstrated an understanding of the instructions.   The patient was advised to call back or seek an in-person  evaluation if the symptoms worsen or if the condition fails to improve as anticipated.   Bethanie Dicker, NP [facility] at [facility] [phone] (phone) [phone] (fax)  [facility]

## 2023-11-11 NOTE — Telephone Encounter (Signed)
FYI

## 2023-11-11 NOTE — Assessment & Plan Note (Signed)
Kathleen Everett exhibits persistent sinus symptoms including nasal congestion, postnasal drip, and intermittent yellowish nasal discharge for 3-4 weeks, indicating a likely sinus infection, especially given no improvement with azelastine nasal spray and over-the-counter sinus medication. We will start Doxycycline twice daily and continue azelastine nasal spray. Kathleen Everett reports a nighttime cough, sometimes productive, without chest congestion or shortness of breath. We will prescribe Bromfed DM cough syrup, 5-37ml as needed, advising a higher dose at bedtime due to potential drowsiness. We advise over-the-counter Tylenol or ibuprofen as needed for headache relief. Should symptoms persist or worsen, Kathleen Everett is to return for an in-person evaluation.

## 2023-11-11 NOTE — Telephone Encounter (Signed)
Given that she is still having increased congestion, etc - needs to be evaluated. Please call her and schedule an appt. Recommend f/u with GI if persistent stomach issues.

## 2023-11-11 NOTE — Telephone Encounter (Signed)
Scheduled with Arville Lime

## 2023-11-18 ENCOUNTER — Ambulatory Visit (INDEPENDENT_AMBULATORY_CARE_PROVIDER_SITE_OTHER): Payer: Medicare PPO | Admitting: Family Medicine
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
## 2023-12-09 DIAGNOSIS — G2581 Restless legs syndrome: Secondary | ICD-10-CM | POA: Diagnosis not present

## 2023-12-09 DIAGNOSIS — M160 Bilateral primary osteoarthritis of hip: Secondary | ICD-10-CM | POA: Diagnosis not present

## 2023-12-09 DIAGNOSIS — M533 Sacrococcygeal disorders, not elsewhere classified: Secondary | ICD-10-CM | POA: Diagnosis not present

## 2023-12-09 DIAGNOSIS — M51372 Other intervertebral disc degeneration, lumbosacral region with discogenic back pain and lower extremity pain: Secondary | ICD-10-CM | POA: Diagnosis not present

## 2023-12-09 DIAGNOSIS — M5412 Radiculopathy, cervical region: Secondary | ICD-10-CM | POA: Diagnosis not present

## 2023-12-09 DIAGNOSIS — G894 Chronic pain syndrome: Secondary | ICD-10-CM | POA: Diagnosis not present

## 2023-12-09 DIAGNOSIS — M47816 Spondylosis without myelopathy or radiculopathy, lumbar region: Secondary | ICD-10-CM | POA: Diagnosis not present

## 2023-12-09 DIAGNOSIS — Z5181 Encounter for therapeutic drug level monitoring: Secondary | ICD-10-CM | POA: Diagnosis not present

## 2023-12-09 DIAGNOSIS — M1611 Unilateral primary osteoarthritis, right hip: Secondary | ICD-10-CM | POA: Diagnosis not present

## 2023-12-09 DIAGNOSIS — Z79899 Other long term (current) drug therapy: Secondary | ICD-10-CM | POA: Diagnosis not present

## 2023-12-16 ENCOUNTER — Ambulatory Visit (INDEPENDENT_AMBULATORY_CARE_PROVIDER_SITE_OTHER): Payer: Medicare Other | Admitting: Family Medicine

## 2023-12-16 ENCOUNTER — Encounter (INDEPENDENT_AMBULATORY_CARE_PROVIDER_SITE_OTHER): Payer: Self-pay | Admitting: Family Medicine

## 2023-12-16 VITALS — BP 105/67 | HR 73 | Temp 98.7°F | Ht 69.5 in | Wt 214.0 lb

## 2023-12-16 DIAGNOSIS — E88819 Insulin resistance, unspecified: Secondary | ICD-10-CM

## 2023-12-16 DIAGNOSIS — Z6831 Body mass index (BMI) 31.0-31.9, adult: Secondary | ICD-10-CM | POA: Diagnosis not present

## 2023-12-16 DIAGNOSIS — E669 Obesity, unspecified: Secondary | ICD-10-CM | POA: Diagnosis not present

## 2023-12-16 NOTE — Progress Notes (Signed)
.smr  Office: [phone]  /  Fax: [phone]  WEIGHT SUMMARY AND BIOMETRICS  Anthropometric Measurements Height: 5' 9.5" (1.765 m) Weight: 214 lb (97.1 kg) BMI (Calculated): 31.16 Weight at Last Visit: 220 lb Weight Lost Since Last Visit: 6 lb Weight Gained Since Last Visit: 0 Starting Weight: 219 lb Total Weight Loss (lbs): 6 lb (2.722 kg) Peak Weight: 320 lb   Body Composition  Body Fat %: 42.9 % Fat Mass (lbs): 91.8 lbs Muscle Mass (lbs): 116 lbs Total Body Water (lbs): 77.6 lbs Visceral Fat Rating : 10   Other Clinical Data Fasting: no Labs: no Today's Visit #: 8 Starting Date: 07/14/23    Chief Complaint: OBESITY   History of Present Illness   The patient presents to discuss obesity and weight loss efforts.  She has lost six pounds over the last six weeks. She is journaling her diet with a calorie goal of 1100 to 1300 calories and a protein goal of 80 or more grams per day, which she meets about 75% of the time. Her diet includes eggs, chicken, and green beans, and she is mindful of her protein intake. She occasionally consumes higher-calorie foods, such as flatbread pizza, and is concerned about the impact on her weight. She uses a supplement containing chromium picolinate, berberine, raspberry ketone, acidophilus, and caffeine to help suppress her appetite.  She has not been exercising due to back and hip pain. She mentions a history of taking steroids, which increased her hunger, and an adjustment in her Wellbutrin dosage. She was advised to increase her metformin to three times a day but did not start the third dose due to stomach discomfort and nausea. Currently, she is not taking metformin due to these symptoms. She has stopped taking vitamin D due to constipation.  She takes a muscle relaxer, typically half a dose at lunchtime and a full dose before bed, which helps her sleep for the first two hours. She feels her sleep is inconsistent and reports feeling  drained despite taking B12 supplements.  She reports feeling tired and congested, possibly related to a recent sinus infection. She has not been taking her cholesterol medication recently, as she wants to diet control this if possible          PHYSICAL EXAM:  Blood pressure 105/67, pulse 73, temperature 98.7 F (37.1 C), height 5' 9.5" (1.765 m), weight 214 lb (97.1 kg), last menstrual period 12/08/2006, SpO2 100%. Body mass index is 31.15 kg/m.  DIAGNOSTIC DATA REVIEWED:  BMET    Component Value Date/Time   NA 138 10/31/2023 0856   NA 142 07/14/2023 0952   NA 144 03/21/2012 0649   K 3.9 10/31/2023 0856   K 3.4 (L) 03/21/2012 0649   CL 103 10/31/2023 0856   CL 109 (H) 03/21/2012 0649   CO2 27 10/31/2023 0856   CO2 27 03/21/2012 0649   GLUCOSE 80 10/31/2023 0856   GLUCOSE 95 03/21/2012 0649   BUN 18 10/31/2023 0856   BUN 13 07/14/2023 0952   BUN 3 (L) 03/21/2012 0649   CREATININE 0.83 10/31/2023 0856   CREATININE 0.76 03/21/2012 0649   CALCIUM 9.1 10/31/2023 0856   CALCIUM 8.2 (L) 03/21/2012 0649   GFRNONAA >60 05/11/2020 1626   GFRNONAA >60 03/21/2012 0649   GFRAA >60 05/11/2020 1626   GFRAA >60 03/21/2012 0649   Lab Results  Component Value Date   HGBA1C 5.1 07/14/2023   HGBA1C 5.7 06/18/2018   Lab Results  Component Value Date  INSULIN 14.5 07/14/2023   Lab Results  Component Value Date   TSH 2.630 07/14/2023   CBC    Component Value Date/Time   WBC 4.9 09/11/2023 0841   RBC 4.58 09/11/2023 0841   HGB 13.5 09/11/2023 0841   HGB 13.7 07/14/2023 0952   HCT 41.5 09/11/2023 0841   HCT 40.8 07/14/2023 0952   PLT 230.0 09/11/2023 0841   PLT 204 07/14/2023 0952   MCV 90.5 09/11/2023 0841   MCV 90 07/14/2023 0952   MCV 89 03/21/2012 0649   MCH 30.0 07/14/2023 0952   MCH 28.3 05/11/2020 1626   MCHC 32.6 09/11/2023 0841   RDW 13.6 09/11/2023 0841   RDW 12.5 07/14/2023 0952   RDW 14.6 (H) 03/21/2012 0649   Iron Studies    Component Value Date/Time    IRON 63 09/11/2023 0841   TIBC 324.8 09/11/2023 0841   FERRITIN 99.1 09/11/2023 0841   IRONPCTSAT 19.4 (L) 09/11/2023 0841   Lipid Panel     Component Value Date/Time   CHOL 222 (H) 10/31/2023 0856   CHOL 278 (H) 07/14/2023 0952   CHOL 235 (H) 03/19/2012 0445   TRIG 79.0 10/31/2023 0856   TRIG 258 (H) 03/19/2012 0445   HDL 62.80 10/31/2023 0856   HDL 47 07/14/2023 0952   HDL 35 (L) 03/19/2012 0445   CHOLHDL 4 10/31/2023 0856   VLDL 15.8 10/31/2023 0856   VLDL 52 (H) 03/19/2012 0445   LDLCALC 144 (H) 10/31/2023 0856   LDLCALC 188 (H) 07/14/2023 0952   LDLCALC 148 (H) 03/19/2012 0445   LDLDIRECT 181.0 06/26/2020 0855   Hepatic Function Panel     Component Value Date/Time   PROT 7.0 10/31/2023 0856   PROT 6.8 07/14/2023 0952   PROT 6.8 03/19/2012 0445   ALBUMIN 4.5 10/31/2023 0856   ALBUMIN 4.5 07/14/2023 0952   ALBUMIN 3.3 (L) 03/19/2012 0445   AST 17 10/31/2023 0856   AST 24 03/19/2012 0445   ALT 19 10/31/2023 0856   ALT 28 03/19/2012 0445   ALKPHOS 76 10/31/2023 0856   ALKPHOS 102 03/19/2012 0445   BILITOT 0.4 10/31/2023 0856   BILITOT 0.2 07/14/2023 0952   BILITOT 0.6 03/19/2012 0445   BILIDIR 0.1 10/31/2023 0856      Component Value Date/Time   TSH 2.630 07/14/2023 0952   Nutritional Lab Results  Component Value Date   VD25OH 33.7 07/14/2023   VD25OH 23.27 (L) 06/18/2018   VD25OH 28.42 (L) 08/01/2016     Assessment and Plan    Obesity   Patient has lost six pounds in the last six weeks, journaling calorie intake (1100-1300 calories/day) and protein intake (80+ grams/day), meeting these goals 75% of the time. She has not been exercising due to back and hip pain. She is using a supplement containing chromium picolinate, berberine, raspberry ketone, acidophilus, and multiple types of caffeine to curb hunger. Discussed the importance of protein intake over calorie count to prevent metabolic slowdown and fatigue. Advised to reassess protein intake to ensure  it meets the goal of 80 grams/day.   - Continue journaling calorie and protein intake   - Focus on protein intake over calorie count   - Reassess protein intake to ensure it meets the goal of 80 grams/day   - Follow up in one month    Insulin Resistance   Patient previously on metformin, stopped due to gastrointestinal side effects. Discussed restarting metformin as it is the most potent treatment for insulin resistance in addition to nutrition.  Advised to start with one tablet per day and increase to two tablets per day if tolerated, monitoring for gastrointestinal side effects.   - Restart metformin at one tablet per day   - Increase to two tablets per day if tolerated   - Monitor for gastrointestinal side effects   - Follow up in one month    General Health Maintenance   Patient inquired about timing of next blood work and measurements. She has not been taking her cholesterol medication consistently. Discussed need for fasting labs at next visit, including A1c and insulin levels, and ensuring waist measurement is taken.   - Plan for fasting labs at next visit, including A1c and insulin levels   - Ensure waist measurement is taken at next visit   - Follow up in one month    Follow-up   - Schedule follow-up appointment in one month   - Ensure fasting labs are ordered for the next visit.        She was informed of the importance of frequent follow up visits to maximize her success with intensive lifestyle modifications for her multiple health conditions.    Quillian Quince, MD

## 2023-12-19 ENCOUNTER — Telehealth: Payer: Medicare PPO | Admitting: Psychiatry
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
## 2023-12-22 ENCOUNTER — Other Ambulatory Visit: Payer: Self-pay | Admitting: Orthopedic Surgery

## 2023-12-22 DIAGNOSIS — M7062 Trochanteric bursitis, left hip: Secondary | ICD-10-CM

## 2023-12-22 DIAGNOSIS — M1612 Unilateral primary osteoarthritis, left hip: Secondary | ICD-10-CM

## 2023-12-24 ENCOUNTER — Encounter: Payer: Self-pay | Admitting: Psychiatry

## 2023-12-24 ENCOUNTER — Telehealth (INDEPENDENT_AMBULATORY_CARE_PROVIDER_SITE_OTHER): Payer: Medicare Other | Admitting: Psychiatry

## 2023-12-24 DIAGNOSIS — F411 Generalized anxiety disorder: Secondary | ICD-10-CM

## 2023-12-24 DIAGNOSIS — F331 Major depressive disorder, recurrent, moderate: Secondary | ICD-10-CM

## 2023-12-24 MED ORDER — BUPROPION HCL ER (XL) 150 MG PO TB24: 150.0000 mg | ORAL_TABLET | Freq: Every day | ORAL | 1 refills | Status: DC

## 2023-12-24 MED ORDER — BUPROPION HCL ER (XL) 300 MG PO TB24: 300.0000 mg | ORAL_TABLET | Freq: Every day | ORAL | 0 refills | Status: DC

## 2023-12-24 NOTE — Patient Instructions (Signed)
 Increase bupropion  450 mg daily Continue sertraline  150 mg daily Next appointment: 3/28 at 10 30

## 2023-12-29 ENCOUNTER — Ambulatory Visit
Admission: RE | Admit: 2023-12-29 | Discharge: 2023-12-29 | Disposition: A | Discharge status: Home / Self Care | Payer: Medicare Other | Source: Ambulatory Visit | Attending: Orthopedic Surgery | Admitting: Orthopedic Surgery

## 2023-12-29 DIAGNOSIS — M7062 Trochanteric bursitis, left hip: Secondary | ICD-10-CM

## 2023-12-29 DIAGNOSIS — R936 Abnormal findings on diagnostic imaging of limbs: Secondary | ICD-10-CM | POA: Diagnosis not present

## 2023-12-29 DIAGNOSIS — M1612 Unilateral primary osteoarthritis, left hip: Secondary | ICD-10-CM | POA: Diagnosis not present

## 2023-12-29 DIAGNOSIS — M25552 Pain in left hip: Secondary | ICD-10-CM | POA: Diagnosis not present

## 2024-01-09 DIAGNOSIS — M5417 Radiculopathy, lumbosacral region: Secondary | ICD-10-CM | POA: Diagnosis not present

## 2024-01-13 ENCOUNTER — Ambulatory Visit (INDEPENDENT_AMBULATORY_CARE_PROVIDER_SITE_OTHER): Payer: Medicare PPO | Admitting: Family Medicine

## 2024-01-30 ENCOUNTER — Telehealth: Payer: Self-pay

## 2024-01-30 ENCOUNTER — Encounter: Payer: Self-pay | Admitting: Internal Medicine

## 2024-01-30 ENCOUNTER — Ambulatory Visit (INDEPENDENT_AMBULATORY_CARE_PROVIDER_SITE_OTHER): Payer: Medicare PPO | Admitting: Internal Medicine

## 2024-01-30 VITALS — BP 116/76 | HR 78 | Temp 98.4°F | Ht 70.0 in | Wt 216.4 lb

## 2024-01-30 DIAGNOSIS — Q0700 Arnold-Chiari syndrome without spina bifida or hydrocephalus: Secondary | ICD-10-CM

## 2024-01-30 DIAGNOSIS — M545 Low back pain, unspecified: Secondary | ICD-10-CM | POA: Diagnosis not present

## 2024-01-30 DIAGNOSIS — G8929 Other chronic pain: Secondary | ICD-10-CM | POA: Diagnosis not present

## 2024-01-30 DIAGNOSIS — E7800 Pure hypercholesterolemia, unspecified: Secondary | ICD-10-CM

## 2024-01-30 DIAGNOSIS — G43809 Other migraine, not intractable, without status migrainosus: Secondary | ICD-10-CM | POA: Diagnosis not present

## 2024-01-30 DIAGNOSIS — R739 Hyperglycemia, unspecified: Secondary | ICD-10-CM | POA: Diagnosis not present

## 2024-01-30 DIAGNOSIS — M26622 Arthralgia of left temporomandibular joint: Secondary | ICD-10-CM | POA: Diagnosis not present

## 2024-01-30 DIAGNOSIS — N901 Moderate vulvar dysplasia: Secondary | ICD-10-CM | POA: Diagnosis not present

## 2024-01-30 DIAGNOSIS — F325 Major depressive disorder, single episode, in full remission: Secondary | ICD-10-CM

## 2024-01-30 DIAGNOSIS — F411 Generalized anxiety disorder: Secondary | ICD-10-CM

## 2024-01-30 DIAGNOSIS — K219 Gastro-esophageal reflux disease without esophagitis: Secondary | ICD-10-CM | POA: Diagnosis not present

## 2024-01-30 NOTE — Progress Notes (Signed)
 Subjective:    Patient ID: Kathleen Everett, female    DOB: [birth year], 53 y.o.   MRN: [id number]  Patient here for a scheduled follow up  HPI Here for a scheduled follow up. Followed by neurology for migraines - ubrelvy, aimovig and rizatriptan.  Seeing psychiatry for depression.  On zoloft. 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  Prescribed aldara. Followed by pain clinic for chronic back pain. S/p spinal cord stimulator. Did not work. Has been removed. Persistent pain. Has f/u next week. Breathing stable. No increased cough or congestion. Having problems with TMJ. Request referral to oral surgeon.    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   Endometriosis    Generalized anxiety disorder 05/01/2015   Has previously seen psychiatry. Having some issues with what she feels is related to stress and anxiety   GERD (gastroesophageal reflux disease)    Hemorrhoids 12/23/2020   Hidradenitis    High blood pressure    History of ovarian cyst    Hot flashes 03/12/2015   Hypercholesteremia 01/10/2014   Off lipitor. Elevated cholesterol. Labs discussed. Restart lipitor. Follow lipid panel and liver function tests.   Hyperglycemia 06/24/2019   IBS (irritable bowel syndrome)    Joint pain    Major depressive disorder 05/01/2015   Migraine  headaches 01/10/2014   Has seen neurology. Topamax.   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 Level is 21.6   Past Surgical History:  Procedure Laterality Date   ABDOMINAL HYSTERECTOMY  03/27/07   laparoscopic   BREAST EXCISIONAL BIOPSY Left 2004   fibroadenoma   CESAREAN SECTION     1999/2004 twins   chiari decompression     COLONOSCOPY WITH PROPOFOL N/A 04/28/2015   Procedure: COLONOSCOPY WITH PROPOFOL;  Surgeon: Scot Jun, MD;  Location: [facility] ENDOSCOPY;  Service: Endoscopy;  Laterality: N/A;   COLONOSCOPY WITH PROPOFOL N/A 03/19/2021   Procedure: COLONOSCOPY WITH PROPOFOL;  Surgeon: Regis Bill, MD;  Location: [facility] ENDOSCOPY;  Service: Endoscopy;  Laterality: N/A;   COLPOSCOPY N/A 04/24/2022   Procedure: COLPOSCOPY, ABLATION OF VULVAR TISSUE;  Surgeon: Artelia Laroche, MD;  Location: [facility] ORS;  Service: Gynecology;  Laterality: N/A;   CYSTOSCOPY     bladder ulcers   ESOPHAGOGASTRODUODENOSCOPY N/A 04/28/2015   Procedure: ESOPHAGOGASTRODUODENOSCOPY (EGD);  Surgeon: Scot Jun, MD;  Location: [facility] ENDOSCOPY;  Service: Endoscopy;  Laterality: N/A;   ESOPHAGOGASTRODUODENOSCOPY (EGD) WITH PROPOFOL N/A 03/19/2021   Procedure: ESOPHAGOGASTRODUODENOSCOPY (EGD) WITH PROPOFOL;  Surgeon: Regis Bill, MD;  Location: [facility] ENDOSCOPY;  Service: Endoscopy;  Laterality: N/A;   HERNIA REPAIR Right 2003    inguinal   INCISION AND DRAINAGE HIP Right 05/11/2020   Procedure: IRRIGATION AND DEBRIDEMENT HIP;  Surgeon: Kennedy Bucker, MD;  Location: [facility] ORS;  Service: Orthopedics;  Laterality: Right;   OVARIAN CYST REMOVAL  2001   right   TEMPOROMANDIBULAR JOINT SURGERY  2000   TOTAL HIP ARTHROPLASTY Right 12/28/2019   Procedure: TOTAL HIP ARTHROPLASTY ANTERIOR APPROACH;  Surgeon: Kennedy Bucker, MD;  Location: [facility] ORS;  Service: Orthopedics;  Laterality: Right;   TUBAL LIGATION     Bilateral   VULVA /PERINEUM BIOPSY  04/24/2022   Procedure: VULVAR BIOPSY;  Surgeon: Artelia Laroche, MD;  Location: [facility] ORS;  Service: Gynecology;;   Family History  Problem Relation Age of Onset   Hyperlipidemia Mother    Anxiety disorder Mother    Atrial fibrillation Mother    Hypertension Mother    Memory loss Mother    Anxiety disorder Father    Stroke Father    Hyperlipidemia Father    Hypertension Father    Congestive Heart Failure Father    Depression Father    Hyperlipidemia Brother    Colon cancer Paternal Aunt    Dementia Paternal Aunt    Dementia Maternal Grandmother    Cancer Maternal Grandmother        unk primary; d. late 61s   Dementia Paternal Grandmother    ADD / ADHD Daughter    Breast cancer Neg Hx    Social History   Socioeconomic History   Marital status: Married    Spouse name: Thayer Ohm   Number of children: 3   Years of education: 13   Highest education level: Some college, no degree  Occupational History   Occupation: Disability  Tobacco Use   Smoking status: Never   Smokeless tobacco: Never  Vaping Use   Vaping status: Never Used  Substance and Sexual Activity   Alcohol use: Not Currently    Alcohol/week: 0.0 standard drinks of alcohol   Drug use: No   Sexual activity: Yes    Birth control/protection: Surgical    Comment: Hysterectomy  Other Topics Concern   Not on file  Social History Narrative   Patient drinks 2-3 cups of caffeine daily.   Patient is right  handed.       Social Hx:   Current living situation- [place]. Living with husband and 3 kids   Born [place] Raised- [place]raised by both parents.    Siblings- 1 brother who is 7 yrs older than her   Legal issues- none   Married for 24 yrs. Currently unemployed since 2011 when she hurt her back. HS grad and CNA and phlebotomy.    Social Drivers of Health   Financial Resource Strain: Medium Risk (04/20/2023)   Received from [facility], [facility]   Overall Financial Resource Strain (CARDIA)    Difficulty of Paying Living Expenses: Somewhat hard  Food Insecurity: Food Insecurity Present (04/20/2023)   Received from [facility], [facility]   Hunger Vital  Sign    Worried About Programme researcher, broadcasting/film/video in the Last Year: Sometimes true    Ran Out of Food in the Last Year: Sometimes true  Transportation Needs: No Transportation Needs (04/20/2023)   Received from [employer], [facility]   PRAPARE - Transportation    Lack of Transportation (Medical): No    Lack of Transportation (Non-Medical): No  Physical Activity: Unknown (04/20/2023)   Received from [facility], [facility]   Exercise Vital Sign    Days of Exercise per Week: 0 days    Minutes of Exercise per Session: Not on file  Recent Concern: Physical Activity - Insufficiently Active (03/14/2023)   Exercise Vital Sign    Days of Exercise per Week: 7 days    Minutes of Exercise per Session: 10 min  Stress: Stress Concern Present (04/20/2023)   Received from [facility], [facility] of Occupational Health - Occupational Stress Questionnaire    Feeling of Stress : Very much  Social Connections: Socially Isolated (04/20/2023)   Received from [facility], [facility]   Social Network    How would you rate your social network (family, work, friends)?: Little participation, lonely and socially isolated     Review of Systems  Constitutional:  Negative for appetite change and unexpected weight change.  HENT:   Negative for congestion and sinus pressure.   Respiratory:  Negative for cough, chest tightness and shortness of breath.   Cardiovascular:  Negative for chest pain, palpitations and leg swelling.  Gastrointestinal:  Negative for abdominal pain, diarrhea, nausea and vomiting.  Genitourinary:  Negative for difficulty urinating and dysuria.  Musculoskeletal:  Positive for back pain. Negative for myalgias.  Skin:  Negative for color change and rash.  Neurological:  Negative for dizziness.       No increased headaches.   Psychiatric/Behavioral:  Negative for agitation and dysphoric mood.        Objective:     BP 116/76   Pulse 78   Temp 98.4 F (36.9 C) (Oral)   Ht 5\' 10"  (1.778 m)   Wt 216 lb 6.4 oz (98.2 kg)   LMP 12/08/2006   SpO2 98%   BMI 31.05 kg/m  Wt Readings from Last 3 Encounters:  01/30/24 216 lb 6.4 oz (98.2 kg)  12/16/23 214 lb (97.1 kg)  11/11/23 222 lb (100.7 kg)    Physical Exam Vitals reviewed.  Constitutional:      General: She is not in acute distress.    Appearance: Normal appearance.  HENT:     Head: Normocephalic and atraumatic.     Right Ear: External ear normal.     Left Ear: External ear normal.     Mouth/Throat:     Pharynx: No oropharyngeal exudate or posterior oropharyngeal erythema.  Eyes:     General: No scleral icterus.       Right eye: No discharge.        Left eye: No discharge.     Conjunctiva/sclera: Conjunctivae normal.  Neck:     Thyroid: No thyromegaly.  Cardiovascular:     Rate and Rhythm: Normal rate and regular rhythm.  Pulmonary:     Effort: No respiratory distress.     Breath sounds: Normal breath sounds. No wheezing.  Abdominal:     General: Bowel sounds are normal.     Palpations: Abdomen is soft.     Tenderness: There is no abdominal tenderness.  Musculoskeletal:  General: No swelling or tenderness.     Cervical back: Neck supple. No tenderness.  Lymphadenopathy:     Cervical: No cervical adenopathy.   Skin:    Findings: No erythema or rash.  Neurological:     Mental Status: She is alert.  Psychiatric:        Mood and Affect: Mood normal.        Behavior: Behavior normal.         Outpatient Encounter Medications as of 01/30/2024  Medication Sig   acetaminophen (TYLENOL) 325 MG tablet Take 500 mg by mouth every 6 (six) hours as needed.   AIMOVIG 140 MG/ML SOAJ Inject 140 mg into the skin every 28 (twenty-eight) days.   atorvastatin (LIPITOR) 20 MG tablet Take 1 tablet (20 mg total) by mouth daily.   azelastine (ASTELIN) 0.1 % nasal spray Place 1 spray into both nostrils 2 (two) times daily. Use in each nostril as directed   brompheniramine-pseudoephedrine-DM 30-2-10 MG/5ML syrup Take 5-10 mLs by mouth every 6 (six) hours as needed.   buPROPion (WELLBUTRIN XL) 150 MG 24 hr tablet Take 1 tablet (150 mg total) by mouth daily. 450 mg daily. Take along with 300 mg tab   buPROPion (WELLBUTRIN XL) 300 MG 24 hr tablet Take 1 tablet (300 mg total) by mouth daily. 450 mg daily. Take along with 150 mg tab   Crisaborole (EUCRISA) 2 % OINT Apply twice a day to hands prn flares   cyanocobalamin (VITAMIN B12) 1000 MCG/ML injection INJECT 1 ML INTRAMUSCULARLY  ONCE EVERY MONTH   diclofenac Sodium (VOLTAREN) 1 % GEL SMARTSIG:Gram(s) Topical 4 Times Daily PRN   esomeprazole (NEXIUM) 40 MG capsule Take 1 capsule (40 mg total) by mouth 2 (two) times daily before a meal.   estradiol (ESTRACE) 0.1 MG/GM vaginal cream Place 1 Applicatorful vaginally 4 (four) times a week. At bedtime   HYDROcodone-acetaminophen (NORCO) 10-325 MG tablet Take 1 tablet by mouth every 6 (six) hours as needed.   metFORMIN (GLUCOPHAGE) 500 MG tablet Take 1 tablet (500 mg total) by mouth 3 (three) times daily.   methocarbamol (ROBAXIN) 500 MG tablet Take 500 mg by mouth 4 (four) times daily.   rizatriptan (MAXALT) 10 MG tablet Take 10 mg by mouth as needed for migraine. May repeat in 2 hours if needed   sertraline (ZOLOFT) 100 MG  tablet Take 2 tablets (200 mg total) by mouth daily. (Patient taking differently: Take 150 mg by mouth at bedtime.)   topiramate (TOPAMAX) 100 MG tablet Take 100 mg by mouth at bedtime.   Ubrogepant (UBRELVY) 100 MG TABS Take 100 mg by mouth 2 (two) times daily as needed.   Vitamin D, Ergocalciferol, (DRISDOL) 1.25 MG (50000 UNIT) CAPS capsule Take 1 capsule (50,000 Units total) by mouth every 7 (seven) days.   XTAMPZA ER 9 MG C12A Take 1 capsule by mouth 2 (two) times daily.   [DISCONTINUED] doxycycline (VIBRA-TABS) 100 MG tablet Take 1 tablet (100 mg total) by mouth 2 (two) times daily. (Patient not taking: Reported on 12/16/2023)   No facility-administered encounter medications on file as of 01/30/2024.     Lab Results  Component Value Date   WBC 4.9 09/11/2023   HGB 13.5 09/11/2023   HCT 41.5 09/11/2023   PLT 230.0 09/11/2023   GLUCOSE 80 10/31/2023   CHOL 222 (H) 10/31/2023   TRIG 79.0 10/31/2023   HDL 62.80 10/31/2023   LDLDIRECT 181.0 06/26/2020   LDLCALC 144 (H) 10/31/2023   ALT 19 10/31/2023  AST 17 10/31/2023   NA 138 10/31/2023   K 3.9 10/31/2023   CL 103 10/31/2023   CREATININE 0.83 10/31/2023   BUN 18 10/31/2023   CO2 27 10/31/2023   TSH 2.630 07/14/2023   INR 1.1 12/03/2019   HGBA1C 5.1 07/14/2023    MR HIP LEFT WO CONTRAST Result Date: 01/08/2024 CLINICAL DATA:  Left hip pain.  No known injury. EXAM: MR OF THE LEFT HIP WITHOUT CONTRAST TECHNIQUE: Multiplanar, multisequence MR imaging was performed. No intravenous contrast was administered. COMPARISON:  Plain films left hip 10/22/2022. FINDINGS: Bones: There is marked marrow edema in the superior aspect of the left femoral neck most consistent with stress reaction. A subtle hypointense line through the superior aspect of the femoral neck is worrisome for an incomplete fracture, likely a stress injury. There is also marrow edema in the anterior aspect of the right acetabulum which could be due to stress reaction or  secondary to degenerative disease. Artifact from the patient's right hip replacement is noted. No focal marrow lesion is identified. Articular cartilage and labrum Articular cartilage:  Mildly thinned without focal defect. Labrum:  The superior labrum is degenerated.  No focal tear is seen. Joint or bursal effusion Joint effusion:  None. Bursae: Small volume of fluid is present in the left trochanteric bursa. Muscles and tendons Muscles and tendons: Intact. Mild edema is seen in the right hamstrings at their origin consistent with tendinosis. Other findings Miscellaneous:   The patient is status post hysterectomy. IMPRESSION: 1. Findings most consistent with an incomplete stress fracture in the superior aspect of the left femoral neck. Marrow edema in the anterior left acetabulum could be due to stress change or secondary to degenerative disease. 2. Mild to moderate left hip osteoarthritis. 3. Mild left trochanteric bursitis. 4. Right hamstring tendinosis without tear. 5. Insert PRA call report Electronically Signed   By: Drusilla Kanner M.D.   On: 01/08/2024 09:20       Assessment & Plan:  Hypercholesterolemia Assessment & Plan: On atorvastatin. Low cholesterol diet and exercise. Follow lipid panel and liver function tests.   Orders: -     Hepatic function panel; Future -     Basic metabolic panel; Future -     Lipid panel; Future  Hyperglycemia Assessment & Plan: Low carb diet and exercise. Follow met b and A1c.   Orders: -     Hemoglobin A1c; Future  VIN II (vulvar intraepithelial neoplasia II) Assessment & Plan: Followed by oncology. Prescribed aldara. Has upcoming f/u.    Other migraine without status migrainosus, not intractable Assessment & Plan: Seeing neurology for migraines - ubrelvy, aimovig and rizatriptan.   Stable.    Gastroesophageal reflux disease, unspecified whether esophagitis present Assessment & Plan: Continue nexium.    Generalized anxiety  disorder Assessment & Plan: Followed by psychiatry. Continues on zoloft and wellbutrin.  Stable.    Depression, major, in remission [facility]) Assessment & Plan: Followed by psychiatry. Continues on zoloft and wellbutrin.    Chronic low back pain, unspecified back pain laterality, unspecified whether sciatica present Assessment & Plan: Followed by pain clinic. S/p removal of spinal cord stimulator.    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 Followed by neurology.    Arthralgia of left temporomandibular joint Assessment & Plan: Persistent problem. Request referral to oral surgeon.   Orders: -     Ambulatory referral to Oral Maxillofacial Surgery     Dale [place], MD

## 2024-01-30 NOTE — Telephone Encounter (Signed)
 The labs are already ordered.  I am not sure which labs the wellness program will check. There may be duplicate.  See if anyway to add my labs to there labs.

## 2024-01-30 NOTE — Telephone Encounter (Signed)
 Patient states at check-out that she has an appointment with [facility] next week and they are supposed to draw labs at that time.  Patient states she would like to have the labs that Dr. Dale [place] has ordered for her drawn at that appointment instead of making another appointment in 1-2 weeks at our location.  I let patient know that I will send a note back so we can adjust the order for her location.

## 2024-01-31 ENCOUNTER — Encounter: Payer: Self-pay | Admitting: Internal Medicine

## 2024-01-31 NOTE — Assessment & Plan Note (Signed)
S/p surgery.  Followed by neurology.  

## 2024-01-31 NOTE — Assessment & Plan Note (Signed)
 Followed by oncology. Prescribed aldara. Has upcoming f/u.

## 2024-01-31 NOTE — Assessment & Plan Note (Signed)
 Followed by psychiatry. Continues on zoloft and wellbutrin.  Stable.

## 2024-01-31 NOTE — Assessment & Plan Note (Signed)
 Low-carb diet and exercise.  Follow met b and A1c.

## 2024-01-31 NOTE — Assessment & Plan Note (Signed)
 Persistent problem. Request referral to oral surgeon.

## 2024-01-31 NOTE — Assessment & Plan Note (Signed)
 Followed by pain clinic. S/p removal of spinal cord stimulator.

## 2024-01-31 NOTE — Assessment & Plan Note (Signed)
 Seeing neurology for migraines - ubrelvy, aimovig and rizatriptan.   Stable.

## 2024-01-31 NOTE — Assessment & Plan Note (Signed)
 Followed by psychiatry. Continues on zoloft and wellbutrin.

## 2024-01-31 NOTE — Assessment & Plan Note (Signed)
On atorvastatin.  Low cholesterol diet and exercise.  Follow lipid panel and liver function tests.

## 2024-01-31 NOTE — Assessment & Plan Note (Signed)
 Continue nexium

## 2024-02-02 ENCOUNTER — Telehealth: Payer: Self-pay

## 2024-02-02 NOTE — Telephone Encounter (Signed)
 Pt says they are doing a full fasting panel on her 3/31. These should cover labs that Dr Lorin Picket wants.

## 2024-02-02 NOTE — Telephone Encounter (Signed)
 Is there somewhere else we can send her?

## 2024-02-02 NOTE — Telephone Encounter (Signed)
 She can let me know who is in her network.

## 2024-02-02 NOTE — Telephone Encounter (Signed)
 Pt will let us know who is in network.

## 2024-02-02 NOTE — Telephone Encounter (Signed)
 Copied from CRM [id number]. Topic: Referral - Status >> Feb 02, 2024  1:25 PM Martha Clan wrote: Reason for CRM: [facility] stated they cannot accept the referral, it must be from Patient's general dentist.  Also is OON for her insurance provider so it would be payment in full.

## 2024-02-03 DIAGNOSIS — M1611 Unilateral primary osteoarthritis, right hip: Secondary | ICD-10-CM | POA: Diagnosis not present

## 2024-02-03 DIAGNOSIS — M7918 Myalgia, other site: Secondary | ICD-10-CM | POA: Diagnosis not present

## 2024-02-03 DIAGNOSIS — M5412 Radiculopathy, cervical region: Secondary | ICD-10-CM | POA: Diagnosis not present

## 2024-02-03 DIAGNOSIS — M533 Sacrococcygeal disorders, not elsewhere classified: Secondary | ICD-10-CM | POA: Diagnosis not present

## 2024-02-03 DIAGNOSIS — G2581 Restless legs syndrome: Secondary | ICD-10-CM | POA: Diagnosis not present

## 2024-02-03 DIAGNOSIS — G894 Chronic pain syndrome: Secondary | ICD-10-CM | POA: Diagnosis not present

## 2024-02-03 DIAGNOSIS — M51379 Other intervertebral disc degeneration, lumbosacral region without mention of lumbar back pain or lower extremity pain: Secondary | ICD-10-CM | POA: Diagnosis not present

## 2024-02-03 DIAGNOSIS — M160 Bilateral primary osteoarthritis of hip: Secondary | ICD-10-CM | POA: Diagnosis not present

## 2024-02-06 ENCOUNTER — Ambulatory Visit: Payer: Medicaid Other | Admitting: Licensed Clinical Social Worker

## 2024-02-06 DIAGNOSIS — F411 Generalized anxiety disorder: Secondary | ICD-10-CM | POA: Diagnosis not present

## 2024-02-06 DIAGNOSIS — F331 Major depressive disorder, recurrent, moderate: Secondary | ICD-10-CM | POA: Diagnosis not present
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
## 2024-02-12 DIAGNOSIS — M533 Sacrococcygeal disorders, not elsewhere classified: Secondary | ICD-10-CM | POA: Diagnosis not present

## 2024-02-13 ENCOUNTER — Telehealth: Payer: Medicaid Other | Admitting: Psychiatry

## 2024-02-13 ENCOUNTER — Encounter: Payer: Self-pay | Admitting: Psychiatry

## 2024-02-13 DIAGNOSIS — F330 Major depressive disorder, recurrent, mild: Secondary | ICD-10-CM

## 2024-02-13 DIAGNOSIS — F411 Generalized anxiety disorder: Secondary | ICD-10-CM

## 2024-02-13 NOTE — Patient Instructions (Addendum)
 Continue bupropion 300 mg daily Continue sertraline 150 mg daily Next appointment: 5/9 at 10 am

## 2024-02-16 ENCOUNTER — Ambulatory Visit (INDEPENDENT_AMBULATORY_CARE_PROVIDER_SITE_OTHER): Payer: Medicare Other | Admitting: Family Medicine

## 2024-02-16 NOTE — Progress Notes (Unsigned)
 SUBJECTIVE: Discussed the use of AI scribe software for clinical note transcription with the patient, who gave verbal consent to proceed.  Chief Complaint: Obesity  Interim History: She is down 6 lbs since her last visit.   Down 11 lbs since 07/14/2023 TBW loss of 5.0%  Kathleen Everett is here to discuss her progress with her obesity treatment plan. She is on the keeping a food journal and adhering to recommended goals of 1100-1300 calories and 80 protein and states she is following her eating plan approximately 50 % of the time. She states she is not exercising.  Kathleen Everett "Kathleen Everett" is a 53 year old female who presents for follow-up of her obesity treatment plan.  She has lost six pounds since her last visit in January 2025. She follows a journaling plan with a goal of 1100 to 1300 calories and 80 or more grams of protein daily. She previously used Ozempic, which was helpful for weight loss, but is no longer on it due to no insurance coverage. Her current weight loss goal is to reach 175 pounds.  She has a history of insulin resistance and was previously on metformin but stopped due to gastrointestinal side effects. She has resumed but typically only takes 1-2 doses per day depending on how well she is tolerating it.  She is also on Wellbutrin, which she reports is doing okay, and receives B12 injections monthly.  She  reports she has an "incomplete fracture" in her left hip and chronic back pain, which limits her ability to exercise. She also has a history of a right total hip replacement in 2021, which was complicated by an infection requiring additional surgery.  She has a history of vitamin D deficiency and is currently taking over-the-counter vitamin D and calcium supplements.  OBJECTIVE: Visit Diagnoses: Problem List Items Addressed This Visit     Vitamin D deficiency   B12 deficiency   Relevant Orders   Vitamin B12   Hip pain, left   Obesity, Beginning BMI 31.9    Relevant Medications   metFORMIN (GLUCOPHAGE) 500 MG tablet   Insulin resistance - Primary   Relevant Medications   metFORMIN (GLUCOPHAGE) 500 MG tablet   Other Relevant Orders   CMP14+EGFR   Hemoglobin A1c   Insulin, random   Other Visit Diagnoses       Mixed hyperlipidemia       Relevant Orders   Lipid Panel With LDL/HDL Ratio     Obesity   She has lost six pounds since her last visit and is on a calorie-restricted diet of 1100-1300 calories with a goal of 80 grams of protein daily. She has emotional eating related to depression and aims for a target weight of 175 pounds. Her current BMI is 30.29, acceptable for hip surgery planned for later this summer.  Previously on Ozempic, which aided weight loss, but not covered by insurance as she is not a type 2 diabetic.  - Continue dietary plan with 1100-1300 calories and 80 grams of protein daily   - Encourage portion control and smart food choices  - Recommend using Skinny[employer]aste for high-protein recipes  and reviewed website with the patient today.    Insulin Resistance   She was previously on metformin but stopped due to gastrointestinal side effects. Resumed metformin at 500 mg three times daily but experiences stomach upset, leading to reduced frequency of dosing to 1-2 times daily.  Lab Results  Component Value Date   HGBA1C 5.1 07/14/2023  HGBA1C 5.3 10/22/2022   HGBA1C 5.4 07/19/2022   Lab Results  Component Value Date   LDLCALC 144 (H) 10/31/2023   CREATININE 0.83 10/31/2023   INSULIN  Date Value Ref Range Status  07/14/2023 14.5 2.6 - 24.9 uIU/mL Final  ]Continue working on nutrition plan to decrease simple carbohydrates, increase lean proteins and exercise to promote weight loss, improve glycemic control and prevent progression to Type 2 diabetes.  - Continue metformin 500 mg, adjust dose as tolerated   - Order A1c and insulin level and CMET  to assess current status   Meds ordered this encounter  Medications    metFORMIN (GLUCOPHAGE) 500 MG tablet    Sig: Take 1 tablet (500 mg total) by mouth 3 (three) times daily.    Dispense:  90 tablet    Refill:  0    Left Hip Pain She has an incomplete fracture in her left hip and plans to see her surgeon for a total hip replacement, likely in July or August. Her current BMI is suitable for surgery. Emphasized maintaining protein intake to aid recovery post-surgery.   - Plan for total hip replacement surgery in July or August   - Focus on adequate protein intake to aid recovery   She reports chronic back pain, which, along with her hip issues, limits her ability to exercise. Discussed maintaining upper body strength through weightlifting as tolerated.   - Recommend upper body weightlifting exercises as tolerated    Hyperlipidemia LDL is not at goal. Medication(s): Lipitor 20 mg daily Cardiovascular risk factors: dyslipidemia, obesity (BMI >= 30 kg/m2), sedentary lifestyle, and smoking/ tobacco exposure  Lab Results  Component Value Date   CHOL 222 (H) 10/31/2023   HDL 62.80 10/31/2023   LDLCALC 144 (H) 10/31/2023   LDLDIRECT 181.0 06/26/2020   TRIG 79.0 10/31/2023   CHOLHDL 4 10/31/2023   CHOLHDL 5 10/22/2022   CHOLHDL 6 07/19/2022   Lab Results  Component Value Date   ALT 19 10/31/2023   AST 17 10/31/2023   ALKPHOS 76 10/31/2023   BILITOT 0.4 10/31/2023   The 10-year ASCVD risk score (Arnett DK, et al., 2019) is: 0.9%   Values used to calculate the score:     Age: 51 years     Sex: Female     Is Non-Hispanic African American: No     Diabetic: No     Tobacco smoker: No     Systolic Blood Pressure: 112 mmHg     Is BP treated: No     HDL Cholesterol: 62.8 mg/dL     Total Cholesterol: 222 mg/dL  Plan: Continue statin. Continue to work on nutrition plan -decreasing simple carbohydrates, increasing lean proteins, decreasing saturated fats and cholesterol , avoiding trans fats and exercise as able to promote weight loss, improve lipids and  decrease cardiovascular risks. Recheck fasting lipids today.      Vitamin D Deficiency   She is taking over-the-counter vitamin D3 and calcium supplements. Previously experienced constipation with prescribed vitamin D. Plan to check vitamin D levels to assess current regimen effectiveness.   Last vitamin D Lab Results  Component Value Date   VD25OH 33.7 07/14/2023  Low vitamin D levels can be associated with adiposity and may result in leptin resistance and weight gain. Also associated with fatigue.  Currently on vitamin D supplementation without any adverse effects such as nausea, vomiting or muscle weakness.  - Order vitamin D level   - Continue over-the-counter vitamin D3 and calcium supplements  B 12 deficiency Patient receiving B 12 injections from other provider and requests follow up B 12 level today. Endorses fatigue Plan: Recheck B 12 level today.  COntinue B 12 rich nutrition plan.   General Health Maintenance   She is on atorvastatin for cholesterol management, with improving LDL levels. Plan to check lipid panel to monitor cholesterol levels.   - Order lipid panel   - Continue atorvastatin    Follow-up   Scheduled for follow-up in four to five weeks to review lab results and discuss further management.   - Schedule follow-up appointment in four to five weeks   - Order comprehensive metabolic panel, A1c, insulin, fasting lipid panel, vitamin D, and B12 tests  Vitals Temp: 98.5 F (36.9 C) BP: 112/75 Pulse Rate: 66 SpO2: 100 %   Anthropometric Measurements Height: 5' 9.5" (1.765 m) Weight: 208 lb (94.3 kg) BMI (Calculated): 30.29 Weight at Last Visit: 214 lb Weight Lost Since Last Visit: 6 lb Weight Gained Since Last Visit: 0 Starting Weight: 219 lb Total Weight Loss (lbs): 11 lb (4.99 kg) Peak Weight: 320 lb   Body Composition  Body Fat %: 42 % Fat Mass (lbs): 87.4 lbs Muscle Mass (lbs): 114.6 lbs Total Body Water (lbs): 77 lbs Visceral Fat  Rating : 9   Other Clinical Data Fasting: yes Labs: yes Today's Visit #: 9 Starting Date: 07/14/23     ASSESSMENT AND PLAN:  Diet: Kathleen Everett is currently in the action stage of change. As such, her goal is to continue with weight loss efforts. She has agreed to Category 2 Plan and keeping a food journal and adhering to recommended goals of 1100-1300 calories and 80+ grams of protein.  Exercise: Kathleen Everett has been instructed to try a geriatric exercise plan and that some exercise is better than none for weight loss and overall health benefits.   Behavior Modification:  We discussed the following Behavioral Modification Strategies today: increasing lean protein intake, decreasing simple carbohydrates, increasing vegetables, increase H2O intake, increase high fiber foods, meal planning and cooking strategies, emotional eating strategies , avoiding temptations, planning for success, and keep a strict food journal. We discussed various medication options to help Kathleen Everett with her weight loss efforts and we both agreed to continue current medical weight loss plan.  Return in about 4 weeks (around 03/16/2024).Marland Kitchen She was informed of the importance of frequent follow up visits to maximize her success with intensive lifestyle modifications for her multiple health conditions.  Attestation Statements:   Reviewed by clinician on day of visit: allergies, medications, problem list, medical history, surgical history, family history, social history, and previous encounter notes.   Time spent on visit including pre-visit chart review and post-visit care and charting was 31 minutes.    Arsal Tappan, PA-C

## 2024-02-17 ENCOUNTER — Ambulatory Visit (INDEPENDENT_AMBULATORY_CARE_PROVIDER_SITE_OTHER): Admitting: Physician Assistant

## 2024-02-17 ENCOUNTER — Encounter (INDEPENDENT_AMBULATORY_CARE_PROVIDER_SITE_OTHER): Payer: Self-pay | Admitting: Physician Assistant

## 2024-02-17 VITALS — BP 112/75 | HR 66 | Temp 98.5°F | Ht 69.5 in | Wt 208.0 lb

## 2024-02-17 DIAGNOSIS — E782 Mixed hyperlipidemia: Secondary | ICD-10-CM | POA: Diagnosis not present

## 2024-02-17 DIAGNOSIS — E88819 Insulin resistance, unspecified: Secondary | ICD-10-CM | POA: Diagnosis not present

## 2024-02-17 DIAGNOSIS — F3289 Other specified depressive episodes: Secondary | ICD-10-CM

## 2024-02-17 DIAGNOSIS — M25552 Pain in left hip: Secondary | ICD-10-CM

## 2024-02-17 DIAGNOSIS — G4733 Obstructive sleep apnea (adult) (pediatric): Secondary | ICD-10-CM

## 2024-02-17 DIAGNOSIS — E559 Vitamin D deficiency, unspecified: Secondary | ICD-10-CM

## 2024-02-17 DIAGNOSIS — E538 Deficiency of other specified B group vitamins: Secondary | ICD-10-CM

## 2024-02-17 DIAGNOSIS — Z6830 Body mass index (BMI) 30.0-30.9, adult: Secondary | ICD-10-CM

## 2024-02-17 DIAGNOSIS — Z789 Other specified health status: Secondary | ICD-10-CM

## 2024-02-17 DIAGNOSIS — E669 Obesity, unspecified: Secondary | ICD-10-CM

## 2024-02-17 MED ORDER — METFORMIN HCL 500 MG PO TABS: 500.0000 mg | ORAL_TABLET | Freq: Three times a day (TID) | ORAL | 0 refills | Status: DC

## 2024-02-17 NOTE — Telephone Encounter (Signed)
 I reviewed the labs that wellness ordered. Their labs include the labs that I wanted - plus a couple of more.

## 2024-02-19 ENCOUNTER — Encounter (INDEPENDENT_AMBULATORY_CARE_PROVIDER_SITE_OTHER): Payer: Self-pay | Admitting: Physician Assistant

## 2024-02-19 LAB — CMP14+EGFR
ALT: 15 IU/L (ref 0–32)
AST: 15 IU/L (ref 0–40)
Albumin: 4.7 g/dL (ref 3.9–4.9)
Alkaline Phosphatase: 82 IU/L (ref 44–121)
BUN/Creatinine Ratio: 17 (ref 9–23)
BUN: 14 mg/dL (ref 6–24)
Bilirubin Total: 0.3 mg/dL (ref 0.0–1.2)
CO2: 23 mmol/L (ref 20–29)
Calcium: 9.5 mg/dL (ref 8.7–10.2)
Chloride: 104 mmol/L (ref 96–106)
Creatinine, Ser: 0.81 mg/dL (ref 0.57–1.00)
Globulin, Total: 2.1 g/dL (ref 1.5–4.5)
Glucose: 81 mg/dL (ref 70–99)
Potassium: 4 mmol/L (ref 3.5–5.2)
Sodium: 141 mmol/L (ref 134–144)
Total Protein: 6.8 g/dL (ref 6.0–8.5)
eGFR: 88 mL/min/{1.73_m2} (ref >59)

## 2024-02-19 LAB — LIPID PANEL WITH LDL/HDL RATIO
Cholesterol, Total: 183 mg/dL (ref 100–199)
HDL: 57 mg/dL (ref >39)
LDL Chol Calc (NIH): 113 mg/dL — ABNORMAL HIGH (ref 0–99)
LDL/HDL Ratio: 2 ratio (ref 0.0–3.2)
Triglycerides: 72 mg/dL (ref 0–149)
VLDL Cholesterol Cal: 13 mg/dL (ref 5–40)

## 2024-02-19 LAB — HEMOGLOBIN A1C
Est. average glucose Bld gHb Est-mCnc: 97 mg/dL
Hgb A1c MFr Bld: 5 % (ref 4.8–5.6)

## 2024-02-19 LAB — INSULIN, RANDOM: INSULIN: 14.1 u[IU]/mL (ref 2.6–24.9)

## 2024-02-19 LAB — VITAMIN B12: Vitamin B-12: 420 pg/mL (ref 232–1245)

## 2024-02-19 NOTE — Telephone Encounter (Signed)
 Call to patient and notified her of the labs.  Patient did not have any questions.  Call ended.

## 2024-02-20 NOTE — Telephone Encounter (Signed)
 Her last vitamin D level that I see is in 06/2023. Her labs were drawn through wellness.  I am not sure if a vitamin D level can be added to their labs. She may be saying she has contacted them to add. Let me know if there is something I need to do.

## 2024-02-20 NOTE — Telephone Encounter (Signed)
 Called patient to clarify. Nothing further needed at this time.

## 2024-02-20 NOTE — Telephone Encounter (Signed)
 She meant Vit B12 not Vit D

## 2024-02-26 DIAGNOSIS — M25551 Pain in right hip: Secondary | ICD-10-CM | POA: Diagnosis not present

## 2024-02-26 DIAGNOSIS — Z96641 Presence of right artificial hip joint: Secondary | ICD-10-CM | POA: Diagnosis not present

## 2024-03-10 ENCOUNTER — Ambulatory Visit: Payer: Medicare PPO

## 2024-03-12 ENCOUNTER — Ambulatory Visit: Admitting: Licensed Clinical Social Worker

## 2024-03-12 DIAGNOSIS — F411 Generalized anxiety disorder: Secondary | ICD-10-CM | POA: Diagnosis not present

## 2024-03-12 DIAGNOSIS — F330 Major depressive disorder, recurrent, mild: Secondary | ICD-10-CM

## 2024-03-12 NOTE — Progress Notes (Signed)
 THERAPIST PROGRESS NOTE  Virtual Visit via Video Note  I connected with Kathleen Everett on 03/12/24 at  8:00 AM EDT by a video enabled telemedicine application and verified that I am speaking with the correct person using two identifiers.  Location: Patient: Address on file Provider: Providers address   I discussed the limitations of evaluation and management by telemedicine and the availability of in person appointments. The patient expressed understanding and agreed to proceed.      I discussed the assessment and treatment plan with the patient. The patient was provided an opportunity to ask questions and all were answered. The patient agreed with the plan and demonstrated an understanding of the instructions.   The patient was advised to call back or seek an in-person evaluation if the symptoms worsen or if the condition fails to improve as anticipated.  I provided 46 minutes of non-face-to-face time during this encounter.   Marvin Slot, LCSW   Session Time: 8-8:46am  Participation Level: Active   Behavioral Response: CasualAlertEuthymic  Type of Therapy: Individual Therapy  Treatment Goals addressed:      Goal: LTG: Reduce frequency, intensity, and duration of depression symptoms so that daily functioning is improved     Dates: Start:  02/06/24    Expected End:  08/08/24       Disciplines: Interdisciplinary, PROVIDER             Goal: LTG: Increase coping skills to manage depression and improve ability to perform daily activities     Dates:      ProgressTowards Goals: Progressing  Interventions: Solution Focused, Assertiveness Training, and Supportive  Summary: Kathleen Everett is a 53 y.o. female who presents with Change in energy/activity; Difficulty Concentrating; Fatigue; Irritability; Sleep (too much or little); Tearfulness; Worthlessness; anxious feelings, and tension. .   Cln and patient reviewed her treatment plan. Patient agreed  and consented to therapist signing due to virtual visit.  The patient utilized the therapeutic space to process recent discord with her son. She addressed patterns of communication and her reactions, sharing that she does not feel appreciated by her son or husband. The patient reflected on her recent conflicts with her husband and identified the importance of being present with one another. She explored the differences between her faith and those in her support system and discussed her husband's coping strategies. Additionally, she expressed stress related to finances.   The therapist introduced the concept of love languages and inquired whether the patient and her husband would explore their love languages to improve their relationship's health. The patient agreed and will complete this as homework.   Suicidal/Homicidal: Nowithout intent/plan  Therapist Response: Cln utilized active and supportive reflection to assist in patient feeling safe to process. Cln assessed for current safety, symptoms, and stressors since last session. Explored dynamics of healthy relationships and communication. Educated the patient on love languages.   Plan: Return again in 2 weeks.  Diagnosis: Generalized anxiety disorder  MDD (major depressive disorder), recurrent episode, mild (HCC)   Collaboration of Care: AEB psychiatrist can access notes and cln. Will review psychiatrists' notes. Check in with the patient and will see LCSW per availability. Patient agreed with treatment recommendations.     Patient/Guardian was advised Release of Information must be obtained prior to any record release in order to collaborate their care with an outside provider. Patient/Guardian was advised if they have not already done so to contact the registration department to sign all necessary forms in  order for us  to release information regarding their care.   Consent: Patient/Guardian gives verbal consent for treatment and assignment  of benefits for services provided during this visit. Patient/Guardian expressed understanding and agreed to proceed.   Marvin Slot, LCSW 03/12/2024

## 2024-03-13 ENCOUNTER — Other Ambulatory Visit (INDEPENDENT_AMBULATORY_CARE_PROVIDER_SITE_OTHER): Payer: Self-pay | Admitting: Physician Assistant

## 2024-03-13 DIAGNOSIS — E88819 Insulin resistance, unspecified: Secondary | ICD-10-CM

## 2024-03-16 ENCOUNTER — Ambulatory Visit (INDEPENDENT_AMBULATORY_CARE_PROVIDER_SITE_OTHER): Payer: Medicare PPO | Admitting: *Deleted

## 2024-03-16 VITALS — Ht 70.0 in | Wt 206.0 lb

## 2024-03-16 DIAGNOSIS — Z0000 Encounter for general adult medical examination without abnormal findings: Secondary | ICD-10-CM | POA: Diagnosis not present

## 2024-03-16 NOTE — Patient Instructions (Signed)
 Kathleen Everett , Thank you for taking time to come for your Medicare Wellness Visit. I appreciate your ongoing commitment to your health goals. Please review the following plan we discussed and let me know if I can assist you in the future.   Referrals/Orders/Follow-Ups/Clinician Recommendations: Consider updating your vaccines  This is a list of the screening recommended for you and due dates:  Health Maintenance  Topic Date Due   Zoster (Shingles) Vaccine (1 of 2) 05/01/2024*   Pneumococcal Vaccination (1 of 2 - PCV) 01/29/2025*   Flu Shot  06/18/2024   Mammogram  09/09/2024   Medicare Annual Wellness Visit  03/16/2025   Colon Cancer Screening  03/19/2026   Pap with HPV screening  12/19/2027   DTaP/Tdap/Td vaccine (4 - Td or Tdap) 08/18/2032   Hepatitis C Screening  Completed   HIV Screening  Completed   HPV Vaccine  Aged Out   Meningitis B Vaccine  Aged Out   COVID-19 Vaccine  Discontinued  *Topic was postponed. The date shown is not the original due date.    Advanced directives: (Declined) Advance directive discussed with you today. Even though you declined this today, please call our office should you change your mind, and we can give you the proper paperwork for you to fill out. Patient will work on this  Next [employer] Wellness Visit scheduled for next year: Yes 03/22/25 @ 11:30  Managing Pain Without Opioids Opioids are strong medicines used to treat moderate to severe pain. For some people, especially those who have long-term (chronic) pain, opioids may not be the best choice for pain management due to: Side effects like nausea, constipation, and sleepiness. The risk of addiction (opioid use disorder). The longer you take opioids, the greater your risk of addiction. Pain that lasts for more than 3 months is called chronic pain. Managing chronic pain usually requires more than one approach and is often provided by a team of health care providers working together  (multidisciplinary approach). Pain management may be done at a pain management center or pain clinic. How to manage pain without the use of opioids Use non-opioid medicines Non-opioid medicines for pain may include: Over-the-counter or prescription non-steroidal anti-inflammatory drugs (NSAIDs). These may be the first medicines used for pain. They work well for muscle and bone pain, and they reduce swelling. Acetaminophen . This over-the-counter medicine may work well for milder pain but not swelling. Antidepressants. These may be used to treat chronic pain. A certain type of antidepressant (tricyclics) is often used. These medicines are given in lower doses for pain than when used for depression. Anticonvulsants. These are usually used to treat seizures but may also reduce nerve (neuropathic) pain. Muscle relaxants. These relieve pain caused by sudden muscle tightening (spasms). You may also use a pain medicine that is applied to the skin as a patch, cream, or gel (topical analgesic), such as a numbing medicine. These may cause fewer side effects than medicines taken by mouth. Do certain therapies as directed Some therapies can help with pain management. They include: Physical therapy. You will do exercises to gain strength and flexibility. A physical therapist may teach you exercises to move and stretch parts of your body that are weak, stiff, or painful. You can learn these exercises at physical therapy visits and practice them at home. Physical therapy may also involve: Massage. Heat wraps or applying heat or cold to affected areas. Electrical signals that interrupt pain signals (transcutaneous electrical nerve stimulation, TENS). Weak lasers that reduce pain  and swelling (low-level laser therapy). Signals from your body that help you learn to regulate pain (biofeedback). Occupational therapy. This helps you to learn ways to function at home and work with less pain. Recreational therapy. This  involves trying new activities or hobbies, such as a physical activity or drawing. Mental health therapy, including: Cognitive behavioral therapy (CBT). This helps you learn coping skills for dealing with pain. Acceptance and commitment therapy (ACT) to change the way you think and react to pain. Relaxation therapies, including muscle relaxation exercises and mindfulness-based stress reduction. Pain management counseling. This may be individual, family, or group counseling.  Receive medical treatments Medical treatments for pain management include: Nerve block injections. These may include a pain blocker and anti-inflammatory medicines. You may have injections: Near the spine to relieve chronic back or neck pain. Into joints to relieve back or joint pain. Into nerve areas that supply a painful area to relieve body pain. Into muscles (trigger point injections) to relieve some painful muscle conditions. A medical device placed near your spine to help block pain signals and relieve nerve pain or chronic back pain (spinal cord stimulation device). Acupuncture. Follow these instructions at home Medicines Take over-the-counter and prescription medicines only as told by your health care provider. If you are taking pain medicine, ask your health care providers about possible side effects to watch out for. Do not drive or use heavy machinery while taking prescription opioid pain medicine. Lifestyle  Do not use drugs or alcohol  to reduce pain. If you drink alcohol , limit how much you have to: 0-1 drink a day for women who are not pregnant. 0-2 drinks a day for men. Know how much alcohol  is in a drink. In the [place] one drink equals one 12 oz bottle of beer (355 mL), one 5 oz glass of wine (148 mL), or one 1 oz glass of hard liquor (44 mL). Do not use any products that contain nicotine or tobacco. These products include cigarettes, chewing tobacco, and vaping devices, such as e-cigarettes. If you  need help quitting, ask your health care provider. Eat a healthy diet and maintain a healthy weight. Poor diet and excess weight may make pain worse. Eat foods that are high in fiber. These include fresh fruits and vegetables, whole grains, and beans. Limit foods that are high in fat and processed sugars, such as fried and sweet foods. Exercise regularly. Exercise lowers stress and may help relieve pain. Ask your health care provider what activities and exercises are safe for you. If your health care provider approves, join an exercise class that combines movement and stress reduction. Examples include yoga and tai chi. Get enough sleep. Lack of sleep may make pain worse. Lower stress as much as possible. Practice stress reduction techniques as told by your therapist. General instructions Work with all your pain management providers to find the treatments that work best for you. You are an important member of your pain management team. There are many things you can do to reduce pain on your own. Consider joining an online or in-person support group for people who have chronic pain. Keep all follow-up visits. This is important. Where to find more information You can find more information about managing pain without opioids from: American Academy of Pain Medicine: painmed.org Institute for Chronic Pain: instituteforchronicpain.org American Chronic Pain Association: theacpa.org Contact a health care provider if: You have side effects from pain medicine. Your pain gets worse or does not get better with treatments or home therapy.  You are struggling with anxiety or depression. Summary Many types of pain can be managed without opioids. Chronic pain may respond better to pain management without opioids. Pain is best managed when you and a team of health care providers work together. Pain management without opioids may include non-opioid medicines, medical treatments, physical therapy, mental health  therapy, and lifestyle changes. Tell your health care providers if your pain gets worse or is not being managed well enough. This information is not intended to replace advice given to you by your health care provider. Make sure you discuss any questions you have with your health care provider. Document Revised: 02/14/2021 Document Reviewed: 02/14/2021  Elsevier Patient Education  2024 [employer].

## 2024-03-16 NOTE — Progress Notes (Signed)
 Subjective:   Kathleen Everett is a 53 y.o. who presents for a Medicare Wellness preventive visit.  Visit Complete: Virtual I connected with  Kathleen Everett on 03/16/24 by a audio enabled telemedicine application and verified that I am speaking with the correct person using two identifiers.  Patient Location: Home  Provider Location: Office/Clinic  I discussed the limitations of evaluation and management by telemedicine. The patient expressed understanding and agreed to proceed.  Vital Signs: Because this visit was a virtual/telehealth visit, some criteria may be missing or patient reported. Any vitals not documented were not able to be obtained and vitals that have been documented are patient reported.  VideoDeclined- This patient declined Librarian, academic. Therefore the visit was completed with audio only.  Persons Participating in Visit: Patient.  AWV Questionnaire: No: Patient Medicare AWV questionnaire was not completed prior to this visit.  Cardiac Risk Factors include: dyslipidemia     Objective:    Today's Vitals   03/16/24 1503  Weight: 206 lb (93.4 kg)  Height: 5\' 10"  (1.778 m)  PainSc: 5    Body mass index is 29.56 kg/m.     03/16/2024    3:26 PM 09/10/2023    8:49 AM 03/14/2023    1:18 PM 12/18/2022   10:50 AM 08/09/2022    9:33 AM 06/12/2022    3:09 PM 05/08/2022    9:34 AM  Advanced Directives  Does Patient Have a Medical Advance Directive? No No No No No No No  Would patient like information on creating a medical advance directive? No - Patient declined No - Patient declined No - Patient declined No - Patient declined Yes (MAU/Ambulatory/Procedural Areas - Information given) No - Patient declined No - Patient declined    Current Medications (verified) Outpatient Encounter Medications as of 03/16/2024  Medication Sig   acetaminophen  (TYLENOL ) 325 MG tablet Take 500 mg by mouth every 6 (six) hours as needed.    AIMOVIG 140 MG/ML SOAJ Inject 140 mg into the skin every 28 (twenty-eight) days.   atorvastatin  (LIPITOR) 20 MG tablet Take 1 tablet (20 mg total) by mouth daily.   azelastine  (ASTELIN ) 0.1 % nasal spray Place 1 spray into both nostrils 2 (two) times daily. Use in each nostril as directed   buPROPion  (WELLBUTRIN  XL) 300 MG 24 hr tablet Take 1 tablet (300 mg total) by mouth daily. 450 mg daily. Take along with 150 mg tab   Calcium  Citrate-Vitamin D  315-5 MG-MCG TABS Take by mouth.   Crisaborole  (EUCRISA ) 2 % OINT Apply twice a day to hands prn flares   cyanocobalamin  (VITAMIN B12) 1000 MCG/ML injection INJECT 1 ML INTRAMUSCULARLY  ONCE EVERY MONTH   diclofenac Sodium (VOLTAREN) 1 % GEL SMARTSIG:Gram(s) Topical 4 Times Daily PRN   esomeprazole  (NEXIUM ) 40 MG capsule Take 1 capsule (40 mg total) by mouth 2 (two) times daily before a meal.   estradiol  (ESTRACE ) 0.1 MG/GM vaginal cream Place 1 Applicatorful vaginally 4 (four) times a week. At bedtime   HYDROcodone -acetaminophen  (NORCO) 10-325 MG tablet Take 1 tablet by mouth every 6 (six) hours as needed.   ibuprofen  (ADVIL ) 800 MG tablet Take 800 mg by mouth.   metFORMIN  (GLUCOPHAGE ) 500 MG tablet Take 1 tablet (500 mg total) by mouth 3 (three) times daily.   methocarbamol  (ROBAXIN ) 500 MG tablet Take 500 mg by mouth 4 (four) times daily.   rizatriptan (MAXALT) 10 MG tablet Take 10 mg by mouth as needed for migraine. May repeat  in 2 hours if needed   sertraline  (ZOLOFT ) 100 MG tablet Take 2 tablets (200 mg total) by mouth daily. (Patient taking differently: Take 150 mg by mouth at bedtime.)   topiramate  (TOPAMAX ) 100 MG tablet Take 100 mg by mouth at bedtime.   Ubrogepant (UBRELVY) 100 MG TABS Take 100 mg by mouth 2 (two) times daily as needed.   XTAMPZA  ER 13.5 MG C12A Take 1 capsule by mouth 2 (two) times daily.   alendronate (FOSAMAX) 70 MG tablet Take 70 mg by mouth once a week. (Patient not taking: Reported on 03/16/2024)   calcitonin, salmon,  (MIACALCIN/FORTICAL) 200 UNIT/ACT nasal spray 1 spray daily. (Patient not taking: Reported on 03/16/2024)   Vitamin D , Ergocalciferol , (DRISDOL ) 1.25 MG (50000 UNIT) CAPS capsule Take 1 capsule (50,000 Units total) by mouth every 7 (seven) days. (Patient not taking: Reported on 03/16/2024)   No facility-administered encounter medications on file as of 03/16/2024.    Allergies (verified) Vancomycin , Ceftin  [cefuroxime  axetil], Celexa [citalopram hydrobromide], Clindamycin /lincomycin, Penicillins, and Sulfa antibiotics   History: Past Medical History:  Diagnosis Date   Abdominal pain 07/24/2014   Abscess of right hip 05/11/2020   Anemia 01/02/2020   Ankle pain, right 04/24/2014   Anxiety    Attention deficit disorder (ADD)    B12 deficiency    Back pain    Breast pain, right 09/08/2014   Chest pain 02/15/2015   Chiari malformation type I 01/26/2014   s/p sgy decompression   Chronic back pain 12/13/2012   Chronic gastritis 07/18/2015   Chronic tension-type headache, intractable 04/24/2015   Constipation    DDD (degenerative disc disease), lumbar    Depression    Diverticulitis    Dysmenorrhea    Dysplastic nevus 10/09/2015   R abdomen - moderate, limited margins free.   Endometriosis    Generalized anxiety disorder 05/01/2015   Has previously seen psychiatry. Having some issues with what she feels is related to stress and anxiety   GERD (gastroesophageal reflux disease)    Hemorrhoids 12/23/2020   Hidradenitis    High blood pressure    History of ovarian cyst    Hot flashes 03/12/2015   Hypercholesteremia 01/10/2014   Off lipitor. Elevated cholesterol. Labs discussed. Restart lipitor. Follow lipid panel and liver function tests.   Hyperglycemia 06/24/2019   IBS (irritable bowel syndrome)    Joint pain    Major depressive disorder 05/01/2015   Migraine headaches 01/10/2014   Has seen neurology. Topamax .   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 Level is 21.6   Past Surgical History:  Procedure Laterality Date   ABDOMINAL HYSTERECTOMY  03/27/07   laparoscopic   BREAST EXCISIONAL BIOPSY Left 2004   fibroadenoma   CESAREAN SECTION     1999/2004 twins   chiari decompression     COLONOSCOPY WITH PROPOFOL   N/A 04/28/2015   Procedure: COLONOSCOPY WITH PROPOFOL ;  Surgeon: Cassie Click, MD;  Location: [facility] ENDOSCOPY;  Service: Endoscopy;  Laterality: N/A;   COLONOSCOPY WITH PROPOFOL  N/A 03/19/2021   Procedure: COLONOSCOPY WITH PROPOFOL ;  Surgeon: Shane Darling, MD;  Location: [facility] ENDOSCOPY;  Service: Endoscopy;  Laterality: N/A;   COLPOSCOPY N/A 04/24/2022   Procedure: COLPOSCOPY, ABLATION OF VULVAR TISSUE;  Surgeon: Nobie Batch, MD;  Location: [facility] ORS;  Service: Gynecology;  Laterality: N/A;   CYSTOSCOPY     bladder ulcers   ESOPHAGOGASTRODUODENOSCOPY N/A 04/28/2015   Procedure: ESOPHAGOGASTRODUODENOSCOPY (EGD);  Surgeon: Cassie Click, MD;  Location: [facility] ENDOSCOPY;  Service: Endoscopy;  Laterality: N/A;   ESOPHAGOGASTRODUODENOSCOPY (EGD) WITH PROPOFOL  N/A 03/19/2021   Procedure: ESOPHAGOGASTRODUODENOSCOPY (EGD) WITH PROPOFOL ;  Surgeon: Shane Darling, MD;  Location: [facility] ENDOSCOPY;  Service: Endoscopy;  Laterality: N/A;   HERNIA REPAIR Right 2003   inguinal   INCISION AND DRAINAGE HIP Right 05/11/2020   Procedure: IRRIGATION AND DEBRIDEMENT HIP;  Surgeon: Molli Angelucci, MD;  Location: [facility] ORS;  Service: Orthopedics;  Laterality: Right;   OVARIAN CYST REMOVAL  2001   right   TEMPOROMANDIBULAR JOINT SURGERY  2000   TOTAL HIP ARTHROPLASTY Right 12/28/2019   Procedure: TOTAL HIP ARTHROPLASTY ANTERIOR APPROACH;  Surgeon: Molli Angelucci, MD;  Location: [facility] ORS;  Service: Orthopedics;  Laterality: Right;   TUBAL LIGATION     Bilateral   VULVA /PERINEUM BIOPSY  04/24/2022   Procedure: VULVAR BIOPSY;  Surgeon: Nobie Batch, MD;  Location: [facility] ORS;  Service: Gynecology;;   Family History  Problem Relation Age of Onset   Hyperlipidemia Mother    Anxiety disorder Mother    Atrial fibrillation Mother    Hypertension Mother    Memory loss Mother    Anxiety disorder Father    Stroke Father    Hyperlipidemia Father    Hypertension Father    Congestive Heart Failure Father    Depression Father    Hyperlipidemia Brother    Colon cancer Paternal Aunt    Dementia Paternal Aunt    Dementia Maternal Grandmother    Cancer Maternal Grandmother        unk primary; d. late 44s   Dementia Paternal Grandmother    ADD / ADHD Daughter    Breast cancer Neg Hx    Social History   Socioeconomic History   Marital status: Married    Spouse name: Larinda Plover   Number of children: 3   Years of education: 13   Highest education level: Some college, no degree  Occupational History   Occupation: Disability  Tobacco Use   Smoking status: Never   Smokeless tobacco: Never  Vaping Use   Vaping status: Never Used  Substance and Sexual Activity   Alcohol  use: Not Currently    Alcohol /week: 0.0 standard drinks of alcohol    Drug use: No   Sexual activity: Yes    Birth control/protection: Surgical    Comment: Hysterectomy  Other Topics Concern   Not on file  Social History Narrative   Patient drinks 2-3 cups of caffeine daily.   Patient is right handed.       Social Hx:   Current living situation- [place]. Living with husband and 3 kids   Born and Raised-  [place]raised by both parents.    Siblings- 1 brother who is 7 yrs older than her   Legal issues- none   Married for 24 yrs. Currently unemployed since 2011 when she hurt  her back. HS grad and CNA and phlebotomy.    Social Drivers of Corporate investment banker Strain: Low Risk  (03/16/2024)   Overall Financial Resource Strain (CARDIA)    Difficulty of Paying Living Expenses: Not hard at all  Food Insecurity: No Food Insecurity (03/16/2024)   Hunger Vital Sign    Worried About Running Out of Food in the Last Year: Never true    Ran Out of Food in the Last Year: Never true  Transportation Needs: No Transportation Needs (03/16/2024)   PRAPARE - Administrator, Civil Service (Medical): No    Lack of Transportation (Non-Medical): No  Physical Activity: Unknown (03/16/2024)   Exercise Vital Sign    Days of Exercise per Week: Not on file    Minutes of Exercise per Session: 0 min  Stress: No Stress Concern Present (03/16/2024)   [employer] of Occupational Health - Occupational Stress Questionnaire    Feeling of Stress : Only a little  Social Connections: Moderately Integrated (03/16/2024)   Social Connection and Isolation Panel [NHANES]    Frequency of Communication with Friends and Family: More than three times a week    Frequency of Social Gatherings with Friends and Family: Once a week    Attends Religious Services: More than 4 times per year    Active Member of [employer] or Organizations: No    Attends Engineer, structural: Never    Marital Status: Married    Tobacco Counseling Counseling given: Not Answered    Clinical Intake:  Pre-visit preparation completed: Yes  Pain : 0-10 Pain Score: 5  Pain Type: Chronic pain Pain Location: Back Pain Descriptors / Indicators: Aching, Throbbing Pain Onset: More than a month ago Pain Frequency: Constant     BMI - recorded: 29.56 Nutritional Status: BMI 25 -29 Overweight Nutritional Risks: Nausea/ vomitting/  diarrhea (has history of migraines, will call neurologist) Diabetes: No  Lab Results  Component Value Date   HGBA1C 5.0 02/17/2024   HGBA1C 5.1 07/14/2023   HGBA1C 5.3 10/22/2022     How often do you need to have someone help you when you read instructions, pamphlets, or other written materials from your doctor or pharmacy?: 1 - Never  Interpreter Needed?: No  Information entered by :: R. Cade Dashner LPN   Activities of Daily Living     03/16/2024    3:08 PM  In your present state of health, do you have any difficulty performing the following activities:  Hearing? 1  Comment no aids  Vision? 0  Comment glasses and contacts  Difficulty concentrating or making decisions? 1  Walking or climbing stairs? 1  Comment at times  Dressing or bathing? 0  Doing errands, shopping? 0  Preparing Food and eating ? N  Using the Toilet? N  In the past six months, have you accidently leaked urine? N  Do you have problems with loss of bowel control? N  Managing your Medications? N  Managing your Finances? N  Housekeeping or managing your Housekeeping? Y    Patient Care Team: Dellar Fenton, MD as PCP - General (Internal Medicine) Rosan Comfort, MD as Referring Physician (Neurology) Devorah Fonder, MD as Consulting Physician (Cardiology)  Indicate any recent Medical Services you may have received from other than [facility] providers in the past year (date may be approximate).     Assessment:   This is a routine wellness examination for Kathleen Everett.  Hearing/Vision screen Hearing Screening - Comments:: Some issues Vision  Screening - Comments:: Contacts and glasses   Goals Addressed             This Visit's Progress    Patient Stated       Wants to get to a point to have hip surgery       Depression Screen     03/16/2024    3:20 PM 02/06/2024    8:01 AM 01/30/2024   11:32 AM 09/11/2023    8:16 AM 07/24/2023    7:47 AM 07/15/2023   11:52 AM 07/10/2023   10:07 AM  PHQ 2/9 Scores  PHQ  - 2 Score 2  0 0 6 5   PHQ- 9 Score 8   0 18 17   Exception Documentation     Other- indicate reason in comment box    Not completed     sees psychiatry       Information is confidential and restricted. Go to Review Flowsheets to unlock data.    Fall Risk     03/16/2024    3:12 PM 01/30/2024   11:32 AM 01/30/2024   11:31 AM 09/11/2023    8:16 AM 07/24/2023    7:44 AM  Fall Risk   Falls in the past year? 1 0 0 0 0  Number falls in past yr: 1 0 0 0 0  Injury with Fall? 1 0 0 0 0  Comment bruises and no medical attention needed      Risk for fall due to : History of fall(s);Impaired balance/gait No Fall Risks No Fall Risks No Fall Risks No Fall Risks  Follow up Falls evaluation completed;Falls prevention discussed Falls evaluation completed Falls evaluation completed Falls evaluation completed     MEDICARE RISK AT HOME:  Medicare Risk at Home Any stairs in or around the home?: Yes If so, are there any without handrails?: Yes Home free of loose throw rugs in walkways, pet beds, electrical cords, etc?: Yes Adequate lighting in your home to reduce risk of falls?: Yes Life alert?: No Use of a cane, walker or w/c?: Yes (at times) Grab bars in the bathroom?: Yes Shower chair or bench in shower?: Yes Elevated toilet seat or a handicapped toilet?: No  TIMED UP AND GO:  Was the test performed?  No  Cognitive Function: 6CIT completed    02/14/2021    1:28 PM  MMSE - Mini Mental State Exam  Not completed: Unable to complete        03/16/2024    3:26 PM 03/14/2023    1:24 PM 02/18/2022    2:49 PM 02/14/2020    1:30 PM  6CIT Screen  What Year? 0 points 0 points 0 points 0 points  What month? 0 points 0 points 0 points 0 points  What time? 0 points 0 points 0 points 0 points  Count back from 20 0 points 0 points 0 points 0 points  Months in reverse 0 points 0 points 4 points 0 points  Repeat phrase 4 points 4 points 4 points 6 points  Total Score 4 points 4 points 8 points 6 points     Immunizations Immunization History  Administered Date(s) Administered   Td 12/11/1989, 08/15/1999   Tdap 08/18/2022    Screening Tests Health Maintenance  Topic Date Due   Medicare Annual Wellness (AWV)  03/13/2024   Zoster Vaccines- Shingrix (1 of 2) 05/01/2024 (Originally 06/26/1990)   Pneumococcal Vaccine 79-66 Years old (1 of 2 - PCV) 01/29/2025 (Originally 06/26/1990)   INFLUENZA  VACCINE  06/18/2024   MAMMOGRAM  09/09/2024   Cervical Cancer Screening (HPV/Pap Cotest)  12/19/2027   Colonoscopy  03/20/2031   DTaP/Tdap/Td (4 - Td or Tdap) 08/18/2032   Hepatitis C Screening  Completed   HIV Screening  Completed   HPV VACCINES  Aged Out   Meningococcal B Vaccine  Aged Out   COVID-19 Vaccine  Discontinued    Health Maintenance  Health Maintenance Due  Topic Date Due   Medicare Annual Wellness (AWV)  03/13/2024   Health Maintenance Items Addressed: Patient declines vaccines  Additional Screening:  Vision Screening: Recommended annual ophthalmology exams for early detection of glaucoma and other disorders of the eye. Up to date [facility]  Dental Screening: Recommended annual dental exams for proper oral hygiene  Community Resource Referral / Chronic Care Management: CRR required this visit?  No   CCM required this visit?  No     Plan:     I have personally reviewed and noted the following in the patient's chart:   Medical and social history Use of alcohol , tobacco or illicit drugs  Current medications and supplements including opioid prescriptions. Patient is currently taking opioid prescriptions. Information provided to patient regarding non-opioid alternatives. Patient advised to discuss non-opioid treatment plan with their provider. Functional ability and status Nutritional status Physical activity Advanced directives List of other physicians Hospitalizations, surgeries, and ER visits in previous 12 months Vitals Screenings to include cognitive,  depression, and falls Referrals and appointments  In addition, I have reviewed and discussed with patient certain preventive protocols, quality metrics, and best practice recommendations. A written personalized care plan for preventive services as well as general preventive health recommendations were provided to patient.     Felicitas Horse, LPN   [id number]/62/[id number]   After Visit Summary: (MyChart) Due to this being a telephonic visit, the after visit summary with patients personalized plan was offered to patient via MyChart   Notes: Nothing significant to report at this time.

## 2024-03-17 ENCOUNTER — Inpatient Hospital Stay: Attending: Obstetrics and Gynecology | Admitting: Obstetrics and Gynecology

## 2024-03-17 VITALS — BP 130/79 | HR 69 | Temp 98.7°F | Resp 20 | Wt 209.9 lb

## 2024-03-17 DIAGNOSIS — N9089 Other specified noninflammatory disorders of vulva and perineum: Secondary | ICD-10-CM

## 2024-03-17 DIAGNOSIS — N903 Dysplasia of vulva, unspecified: Secondary | ICD-10-CM

## 2024-03-17 DIAGNOSIS — R102 Pelvic and perineal pain: Secondary | ICD-10-CM

## 2024-03-17 MED ORDER — ESTRADIOL 0.05 MG/24HR TD PTTW: 1.0000 | MEDICATED_PATCH | TRANSDERMAL | 6 refills | Status: DC

## 2024-03-17 NOTE — Progress Notes (Signed)
 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 Randalyn Bushman on 04/24/22. Per Kathleen Everett it looks like she had a LEEP in 2008 by Dr. Adela Ades.   She presents for evaluation of VIN after imiquimod  treatment for persistent and symptomatic VIN1.   She c/o pelvic pain No vulvar irritation vaginal spotting or bleeding.     Gynecologic History:  She presented with pain and slow healing hole in LT labia minora; sx off and on for over a year. Has had neg yeast and BV cultures, neg HSV 2 testing. Pt has tried nystatin  crm, estrace  crm, coconut oil, and vaseline without full relief. She couldn't tolerate lotrisone  cream.   03/04/2022 On exam noted to have anterior vulvar lesion that was biopsied. 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   Non-smoker, No abnormal Pap - last Pap 06/24/2019 NIML satisfactory with transformation zone. She doesn't recall a h/o abnormal Pap but thinks she had a LEEP in 2011.   On 04/24/22 she underwent EUA, Colpo of vulva, ablation with bovie, biopsy, and periurethral biopsy. Exam under anesthesia and colposcopy of the vulva after application of acetic acid  revealed 20 x 0.8 mm acetowhite epithelium involving the vulvar tissue adjacent to the urethra. The vagina and cervix were normal. There were no lesion. Bimanual exam was negative for masses or nodularity. All visible vulvar lesions were ablated.   DIAGNOSIS:  A.  VULVA, INFERIOR; BIOPSY: 1:00 (0.7 x 0.2 x 0.2 cm) - HIGH-GRADE SQUAMOUS INTRAEPITHELIAL LESION (HSIL/VIN-2).   B.  PERI URETHRA; BIOPSY:  - HIGH-GRADE SQUAMOUS INTRAEPITHELIAL LESION (HSIL/VIN-2).  These areas were completely ablated.   She represented to clinic  with pain/irritation lower on the vulva adjacent the the Bartholin's gland duct.   06/12/2022 DIAGNOSIS:  A. VULVA, LEFT; BIOPSY:  - BENIGN SQUAMOUS MUCOSA WITH FOCAL INFLAMMATION AND REACTIVE CELLULAR CHANGES.  - NEGATIVE FOR SQUAMOUS INTRAEPITHELIAL LESION AND MALIGNANCY.   12/18/22-  Pap- NILM, HPV Negative  Biopsies were obtained d/t persistent vaginal irritation along left vulva and introitus.  DIAGNOSIS:  A. VULVA, LEFT LATERAL; BIOPSY:  - MILD SPONGIOSIS AND KERATOSIS.  - NEGATIVE FOR SQUAMOUS INTRAEPITHELIAL LESION AND MALIGNANCY.   B.  VULVA, 1:00 MEDIAL; BIOPSY:  - LOW-GRADE SQUAMOUS INTRAEPITHELIAL LESION (LSIL/VIN-1)   12/30/2022 Topical imiquimod  was recommended 3 nights a week. She was unable to tolerate and discontinued after single dose.    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   TMJ arthralgia 10/27/2022   Hip pain, left 10/22/2022   Sacral pain 10/22/2022   Rash 07/20/2022   Bloating 07/19/2022   Acute cystitis without hematuria 06/12/2022   Vulvar irritation 06/12/2022   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   Ear fullness 02/09/2022   Runny nose 09/30/2021   Dizziness 07/22/2021  B12 deficiency    Vaginal irritation 03/03/2021   Soft tissue mass 03/03/2021   Hemorrhoids 12/23/2020   DDD (degenerative disc disease), lumbar 10/03/2020   Abscess of right hip 05/11/2020   Dyspnea on exertion 05/08/2020   Healthcare maintenance 01/02/2020   Anemia 01/02/2020    Status post total hip replacement, 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   Hyperglycemia 06/24/2019   Vaginal bleeding 09/30/2016   Primary osteoarthritis of right hip 01/17/2016   Arnold-Chiari malformation (HCC)    Chronic gastritis 07/18/2015   Depression, major, in remission (HCC) 05/01/2015   Generalized anxiety disorder 05/01/2015   Chronic tension-type headache, intractable 04/24/2015   Dysmenorrhea 04/24/2015   Endometriosis 04/24/2015   Hidradenitis 04/24/2015   Hot flashes 03/12/2015   Chest pain 02/15/2015   BMI 31.0-31.9,adult 10/16/2014   Cough 09/28/2014   Breast pain, right 09/08/2014   Abdominal pain 07/24/2014   GERD (gastroesophageal reflux disease) 04/24/2014   Ankle pain, right 04/24/2014   Vitamin D  deficiency 01/18/2014   Diverticulitis 01/10/2014   Right hip pain 01/10/2014   Migraine headaches 01/10/2014   Obstructive sleep apnea 01/10/2014   Chronic back pain 12/13/2012   Mitral valve prolapse 12/13/2012    Past Medical History: Past Medical History:  Diagnosis Date   Abdominal pain 07/24/2014   Abscess of right hip 05/11/2020   Anemia 01/02/2020   Ankle pain, right 04/24/2014   Anxiety    Attention deficit disorder (ADD)    B12 deficiency    Back pain    Breast pain, right 09/08/2014   Chest pain 02/15/2015   Chiari malformation type I 01/26/2014   s/p sgy decompression   Chronic back pain 12/13/2012   Chronic gastritis 07/18/2015   Chronic tension-type headache, intractable 04/24/2015   Constipation    DDD (degenerative disc disease), lumbar    Depression    Diverticulitis    Dysmenorrhea    Dysplastic nevus 10/09/2015   R abdomen - moderate, limited margins free.   Endometriosis    Generalized anxiety disorder 05/01/2015   Has previously seen psychiatry. Having some issues with what she feels is related to stress and anxiety   GERD (gastroesophageal reflux disease)    Hemorrhoids 12/23/2020   Hidradenitis    High blood pressure    History of ovarian  cyst    Hot flashes 03/12/2015   Hypercholesteremia 01/10/2014   Off lipitor. Elevated cholesterol. Labs discussed. Restart lipitor. Follow lipid panel and liver function tests.   Hyperglycemia 06/24/2019   IBS (irritable bowel syndrome)    Joint pain    Major depressive disorder 05/01/2015   Migraine headaches 01/10/2014   Has seen neurology. Topamax .   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 Level is 21.6    Past Surgical History: Past Surgical History:  Procedure Laterality Date   ABDOMINAL HYSTERECTOMY  03/27/07   laparoscopic  BREAST EXCISIONAL BIOPSY Left 2004   fibroadenoma   CESAREAN SECTION     1999/2004 twins   chiari decompression     COLONOSCOPY WITH PROPOFOL  N/A 04/28/2015   Procedure: COLONOSCOPY WITH PROPOFOL ;  Surgeon: Cassie Click, MD;  Location: [facility] ENDOSCOPY;  Service: Endoscopy;  Laterality: N/A;   COLONOSCOPY WITH PROPOFOL  N/A 03/19/2021   Procedure: COLONOSCOPY WITH PROPOFOL ;  Surgeon: Shane Darling, MD;  Location: [facility] ENDOSCOPY;  Service: Endoscopy;  Laterality: N/A;   COLPOSCOPY N/A 04/24/2022   Procedure: COLPOSCOPY, ABLATION OF VULVAR TISSUE;  Surgeon: Nobie Batch, MD;  Location: [facility] ORS;  Service: Gynecology;  Laterality: N/A;   CYSTOSCOPY     bladder ulcers   ESOPHAGOGASTRODUODENOSCOPY N/A 04/28/2015   Procedure: ESOPHAGOGASTRODUODENOSCOPY (EGD);  Surgeon: Cassie Click, MD;  Location: [facility] ENDOSCOPY;  Service: Endoscopy;  Laterality: N/A;   ESOPHAGOGASTRODUODENOSCOPY (EGD) WITH PROPOFOL  N/A 03/19/2021   Procedure: ESOPHAGOGASTRODUODENOSCOPY (EGD) WITH PROPOFOL ;  Surgeon: Shane Darling, MD;  Location: [facility] ENDOSCOPY;  Service: Endoscopy;  Laterality: N/A;   HERNIA REPAIR Right 2003   inguinal   INCISION AND DRAINAGE HIP Right 05/11/2020   Procedure: IRRIGATION AND DEBRIDEMENT HIP;  Surgeon: Molli Angelucci, MD;  Location: [facility] ORS;  Service: Orthopedics;  Laterality: Right;   OVARIAN CYST REMOVAL  2001   right   TEMPOROMANDIBULAR JOINT SURGERY  2000   TOTAL HIP ARTHROPLASTY Right 12/28/2019   Procedure: TOTAL HIP ARTHROPLASTY ANTERIOR APPROACH;  Surgeon: Molli Angelucci, MD;  Location: [facility] ORS;  Service: Orthopedics;  Laterality: Right;   TUBAL LIGATION     Bilateral   VULVA /PERINEUM BIOPSY  04/24/2022   Procedure: VULVAR BIOPSY;  Surgeon: Nobie Batch, MD;  Location: [facility] ORS;  Service: Gynecology;;   Past Gynecologic History:  Menarche: 14 Menstrual details: n/a History of Abnormal pap: as per HPI Last pap: as per HPI Sexually active: yes  OB History:  OB History     Gravida  2   Para  2   Term  2   Preterm      AB      Living  3      SAB      IAB      Ectopic      Multiple  1   Live Births  3          Family History: Family History  Problem Relation Age of Onset   Hyperlipidemia Mother    Anxiety disorder Mother    Atrial fibrillation Mother    Hypertension Mother    Memory loss Mother    Anxiety disorder Father    Stroke Father    Hyperlipidemia Father    Hypertension Father    Congestive Heart Failure Father    Depression Father    Hyperlipidemia Brother    Colon cancer Paternal Aunt    Dementia Paternal Aunt    Dementia Maternal Grandmother    Cancer Maternal Grandmother        unk primary; d. late 12s   Dementia Paternal Grandmother    ADD / ADHD Daughter    Breast cancer Neg Hx     Social History: Social History   Tobacco Use   Smoking status: Never   Smokeless tobacco: Never  Vaping Use   Vaping status: Never Used  Substance Use Topics   Alcohol  use: Not Currently    Alcohol /week: 0.0 standard drinks of alcohol    Drug use: No    Allergies: Allergies  Allergen Reactions  Vancomycin  Hives   Ceftin  [Cefuroxime  Axetil] Other (See Comments)    Upset stomach   Celexa [Citalopram Hydrobromide] Diarrhea    GI upset     Clindamycin /Lincomycin Rash   Penicillins Rash    Did it involve swelling of the face/tongue/throat, SOB, or low BP? No Did it involve sudden or severe rash/hives, skin peeling, or any reaction on the inside of your mouth or nose? Unknown Did you need to seek medical attention at a hospital or doctor's office? No When did it last happen? childhood reaction If all above answers are "NO", may proceed with cephalosporin use.    Sulfa Antibiotics Rash   Current Medications: Current Outpatient Medications on File Prior to Visit  Medication Sig Dispense Refill   acetaminophen  (TYLENOL ) 325 MG tablet Take 500 mg by mouth every 6 (six) hours as needed.     AIMOVIG 140 MG/ML SOAJ Inject 140 mg into the skin every 28 (twenty-eight) days.     atorvastatin  (LIPITOR) 20 MG tablet Take 1 tablet (20 mg total) by mouth daily. 90 tablet 1   azelastine  (ASTELIN ) 0.1 % nasal spray Place 1 spray into both nostrils 2 (two) times daily. Use in each nostril as directed 30 mL 2   buPROPion  (WELLBUTRIN  XL) 300 MG 24 hr tablet Take 1 tablet (300 mg total) by mouth daily. 450 mg daily. Take along with 150 mg tab 90 tablet 0   Crisaborole  (EUCRISA ) 2 % OINT Apply twice a day to hands prn flares 60 g 2   cyanocobalamin  (VITAMIN B12) 1000 MCG/ML injection INJECT 1 ML INTRAMUSCULARLY  ONCE EVERY MONTH 10 mL 0   diclofenac Sodium (VOLTAREN) 1 % GEL SMARTSIG:Gram(s) Topical 4 Times Daily PRN     esomeprazole  (NEXIUM ) 40 MG capsule Take 1 capsule (40 mg total) by mouth 2  (two) times daily before a meal. 90 capsule 3   estradiol  (ESTRACE ) 0.1 MG/GM vaginal cream Place 1 Applicatorful vaginally 4 (four) times a week. At bedtime 42.5 g 0   HYDROcodone -acetaminophen  (NORCO) 10-325 MG tablet Take 1 tablet by mouth every 6 (six) hours as needed.     ibuprofen  (ADVIL ) 800 MG tablet Take 800 mg by mouth.     metFORMIN  (GLUCOPHAGE ) 500 MG tablet Take 1 tablet (500 mg total) by mouth 3 (three) times daily. 90 tablet 0   methocarbamol  (ROBAXIN ) 500 MG tablet Take 500 mg by mouth 4 (four) times daily.     rizatriptan (MAXALT) 10 MG tablet Take 10 mg by mouth as needed for migraine. May repeat in 2 hours if needed     sertraline  (ZOLOFT ) 100 MG tablet Take 2 tablets (200 mg total) by mouth daily. (Patient taking differently: Take 150 mg by mouth at bedtime.) 180 tablet 0   topiramate  (TOPAMAX ) 100 MG tablet Take 100 mg by mouth at bedtime.     Ubrogepant (UBRELVY) 100 MG TABS Take 100 mg by mouth 2 (two) times daily as needed.     XTAMPZA  ER 13.5 MG C12A Take 1 capsule by mouth 2 (two) times daily.     alendronate (FOSAMAX) 70 MG tablet Take 70 mg by mouth once a week. (Patient not taking: Reported on 03/17/2024)     calcitonin, salmon, (MIACALCIN/FORTICAL) 200 UNIT/ACT nasal spray 1 spray daily. (Patient not taking: Reported on 03/17/2024)     Calcium  Citrate-Vitamin D  315-5 MG-MCG TABS Take by mouth. (Patient not taking: Reported on 03/17/2024)     Vitamin D , Ergocalciferol , (DRISDOL ) 1.25 MG (50000 UNIT) CAPS capsule Take  1 capsule (50,000 Units total) by mouth every 7 (seven) days. (Patient not taking: Reported on 03/17/2024) 4 capsule 0   No current facility-administered medications on file prior to visit.   Review of Systems Directed gynecologic symptoms as noted in interval history.   Objective:  Physical Examination:  Today's Vitals   03/17/24 0830  BP: 130/79  Pulse: 69  Resp: 20  Temp: 98.7 F (37.1 C)  SpO2: 100%  Weight: 209 lb 14.4 oz (95.2 kg)   Body  mass index is 30.12 kg/m.  ECOG Performance Status: 0 - Asymptomatic  GENERAL: Patient is a well appearing female in no acute distress HEENT:  Sclera clear. Anicteric ABDOMEN:  Soft, nontender. No masses or ascites.  SKIN: well healed lower abdominal incision EXTREMITIES:  No peripheral edema. Atraumatic. No cyanosis NEURO:  Nonfocal. Well oriented.  Appropriate affect.  Pelvic: chaperoned by CMA EGBUS: no gross lesions; Area of prior surgery depigmented on labia minora.  Vagina: Atrophy of vaginal introitus. No lesions Cervix: no lesions, nontender, mobile, stenotic os.  Vagina: no lesions, no discharge or bleeding. Positive for atrophy Uterus: surgically absent BME: no palpable masses   Vulvar Colposcopy and Vulvar Biopsy The risks and benefits of the procedure were reviewed and informed consent obtained. Time out was performed. The patient received pre-procedure teaching and expressed understanding. The post-procedure instructions were reviewed with the patient and she expressed understanding. The patient does not have any barriers to learning.   No AWE noted on the vulva or the the left at site of prior ablation or medially upper vaginal introitus inferior to urethra - areas where she had abnormal findings before. NO biopsies.   Lab Review No labs on site today.   Radiologic Imaging: Pelvic US  pending    Assessment:  Kathleen Everett is a 53 y.o. 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 Symptomatic with vulvar irritation, colposcopy more c/w atrophy than VIN. Biopsies obtained. Were benign and VIN I. Recommended topical treatment with aldara  but she was unable to tolerate and discontinued. Stable to slightly improved symptoms and reassuring exam.   Chronic pelvic pain, no recent imaging, reassuring exam. Suspect pelvic adhesive disease  Medical co-morbidities  complicating care: Chiari malformation type I, Sleep apnea, hyperglycemia, prior laparotomy Plan:   Problem List Items Addressed This Visit   Vulvar dysplasia  Vulvar irritation  Female pelvic pain  Vulvar irritation; h/o VIN; atrophy: Information on Apply topical estrogen estradiol  0.1 mg/gm to the vulva every night for 2-3 weeks and then 2-3x a week. Vivelle  estrogen patch 0.05 ordered  Will plan to see her back in 6 months and we can repeat colposcopy & possible biopsies if indicated.   Female pelvic pain - US  ordered.   The patient's diagnosis, an outline of the further diagnostic and laboratory studies which will be required, the recommendation, and alternatives were discussed.  All questions were answered to the patient's satisfaction.  I personally had a face to face interaction and evaluated the patient. I have reviewed her history and available records and have performed the physical exam.  I have discussed the case with the patient.   Olivia Royse Iola Manila, MD

## 2024-03-17 NOTE — Patient Instructions (Signed)
 Apply topical estrogen to the vulva every night for 2-3 weeks and then 2-3x a week. This will help with the thinness of the skin and help your symptoms improve  Please consider healthy vulval hygiene practices as noted below.     Avoid   Pantyhose   Synthetic underwear  Jeans and other tight pants  Swimsuits, leotards, thongs, lycra garments  Pantyliners  Scented soaps or shampoos  Bubble bath   Scented detergents   Washcloths  Feminine sprays, douches, powders   Dyed toilet articles   Hair dryers to dry vulva skin without contact       Substitute   Stockings with a garter belt  Thigh-high or knee-high stockings  Cotton underwear or no underwear  Loose pants, skirts, dresses  Loose-fitting cotton garments  Tampons or cotton pads  Fragrance-free pH neutral soap (eg, Basis, Neutrogena, Dove soap)  Tub baths in the morning and at night without additives and at a comfortable temperature Unscented detergents Use fingertips for washing; pat dry, don't rub dry  These are not necessary products and can be omitted from personal practices Toilet articles without dyes Dry vulva by gentle patting

## 2024-03-18 ENCOUNTER — Ambulatory Visit
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
## 2024-03-26 ENCOUNTER — Ambulatory Visit: Admitting: Licensed Clinical Social Worker

## 2024-03-26 ENCOUNTER — Telehealth (INDEPENDENT_AMBULATORY_CARE_PROVIDER_SITE_OTHER): Admitting: Psychiatry

## 2024-03-26 ENCOUNTER — Encounter: Payer: Self-pay | Admitting: Psychiatry

## 2024-03-26 DIAGNOSIS — F3341 Major depressive disorder, recurrent, in partial remission: Secondary | ICD-10-CM

## 2024-03-26 DIAGNOSIS — F411 Generalized anxiety disorder: Secondary | ICD-10-CM | POA: Diagnosis not present

## 2024-03-26 MED ORDER — BUPROPION HCL ER (XL) 300 MG PO TB24: 300.0000 mg | ORAL_TABLET | Freq: Every day | ORAL | 1 refills | Status: DC

## 2024-03-26 MED ORDER — SERTRALINE HCL 100 MG PO TABS: 150.0000 mg | ORAL_TABLET | Freq: Every day | ORAL | 0 refills | Status: DC

## 2024-03-26 NOTE — Patient Instructions (Signed)
 Continue bupropion  300 mg daily  Continue sertraline  150 mg daily Next appointment: 6/27 at 9 am

## 2024-03-29 ENCOUNTER — Ambulatory Visit (INDEPENDENT_AMBULATORY_CARE_PROVIDER_SITE_OTHER): Admitting: Licensed Clinical Social Worker

## 2024-03-29 DIAGNOSIS — F411 Generalized anxiety disorder: Secondary | ICD-10-CM

## 2024-03-29 DIAGNOSIS — F3341 Major depressive disorder, recurrent, in partial remission: Secondary | ICD-10-CM

## 2024-03-29 NOTE — Progress Notes (Unsigned)
 SUBJECTIVE: Discussed the use of AI scribe software for clinical note transcription with the patient, who gave verbal consent to proceed.  Chief Complaint: Obesity  Interim History: She is down 2 lbs since her last visit.  Down 13 lbs overall TBW loss of  Kathleen Everett is here to discuss her progress with her obesity treatment plan. She is on the Category 2 Plan and keeping a food journal and adhering to recommended goals of 1500 calories and 85 grams of protein and states she is following her eating plan approximately 25 % of the time. She states she is not exercising. Kathleen Everett "Kathleen Everett" is a 53 year old female who presents for follow-up of her obesity treatment plan.  She is following a category two obesity treatment plan, which includes journaling and consuming 1100 to 1300 calories a day with 80 grams of protein. She adheres to this plan about 40 to 50% of the time and has lost two pounds since her last visit. She faces dietary challenges, including inconsistent eating patterns and difficulty maintaining protein intake. She often skips meals and then experiences periods of intense hunger. She is trying to increase her protein intake, especially in preparation for potential surgery, and is considering using protein shakes to help meet her nutritional needs. We discussed strategies to increase protein intake more consistently.We discussed the importance of regular meals and adequate protein intake for weight loss and overall health. We discussed how low protein intake may impact any upcoming surgery.    She has a history of insulin  resistance, vitamin D  deficiency, emotional eating, and mixed hyperlipidemia. She is currently prescribed metformin  500 mg three times daily for insulin  resistance, but takes it only once a day due to feeling 'blah' and experiencing nausea.   Recent lab results show improvement in cholesterol levels, with total cholesterol at 183, HDL at 57, triglycerides at  72, and LDL reduced from 144 to 113. Her A1c is at 5.0, indicating good control of blood sugar levels, but her insulin  level remains elevated in the 14s.  She experiences chronic pain, particularly in her hip and back, and is considering surgery. A hip MRI showed a fracture, and she has been using a nasal spray for bone health. She has also had an SI injection for back pain, which has provided some relief. She is scheduled to see a surgeon at the end of the month to discuss potential hip surgery.  She feels sluggish and tired, which she attributes to chronic pain and possibly her insulin  resistance. She experiences constipation when taking Ergocalciferol  for vitamin D  deficiency and has stopped taking Ergocalciferol  due to these issues.   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 She is on disability and has limited financial resources.  OBJECTIVE: Visit Diagnoses: Problem List Items Addressed This Visit     Chronic back pain   Vitamin D  deficiency   Obesity, Beginning BMI 31.9   Relevant Medications   metFORMIN  (GLUCOPHAGE ) 500 MG tablet   Insulin  resistance - Primary   Relevant Medications   metFORMIN  (GLUCOPHAGE ) 500 MG tablet   Other Visit Diagnoses       Mixed hyperlipidemia         BMI 30.0-30.9,adult Current BMI 30.0         Obesity Following a category two plan, journaling 1100-1300 calories daily and 80 grams of protein approximately 25% of the time. Adhering to the Category 2 plan 40-50% of the time.  Weight decreased by two pounds since the last visit. Maintaining muscle mass well and visceral adipose rating is down to nine, with a goal of ten or less, indicating decreased risk for chronic issues like type 2 diabetes and improved cholesterol levels.  Discussed the importance of protein intake, especially in preparation for potential surgery, to prevent muscle loss and aid recovery. - Encourage adherence to the category two  plan - Increase protein intake to 80-85 grams per day, especially in preparation for potential surgery - Consider protein shakes such as Premier or Quest to help meet protein goals - Avoid simple carbohydrates and fried foods.   Insulin  resistance Currently on metformin  500 mg once daily, but insulin  level remains in the 14s with a goal of 5 or less. A1c is at 5.0, indicating good control and not in the prediabetic range. Insulin  resistance persists, and metformin  is beneficial to improve insulin  effectiveness and reduce adipose storage. Discussed the role of metformin  in preventing pancreatic burnout and its benefits in reducing the risk of type 2 diabetes. - Increase metformin  to 500 mg twice daily, taken with food to minimize gastrointestinal side effects - Monitor for nausea and gastrointestinal discomfort - Continue to avoid simple carbohydrates /Continue working on nutrition plan to decrease simple carbohydrates, increase lean proteins and exercise to promote weight loss, improve glycemic control and prevent progression to Type 2 diabetes.    Mixed hyperlipidemia Total cholesterol is down to 183, HDL is at 57, triglycerides at 72, and LDL has improved from 144 to 113. Improvement attributed to dietary changes, including reduced intake of fried foods and saturated fats. Discussed the benefits of exercise in further improving HDL levels, though current physical limitations are acknowledged. - Continue dietary modifications to maintain lipid levels - Encourage exercise as tolerated to further improve HDL levels  Chronic pain Chronic pain related to hip and back. Recent sacroiliac injection has stabilized back pain. Scheduled to see a surgeon for hip evaluation at the end of the month. Discussed potential for less invasive back surgery options. Discussed the impact of chronic pain on energy levels and the potential benefits of addressing hip and back issues. - Follow up with surgeon for hip  evaluation - Continue current pain management strategies Continue to work on nutrition plan to promote healthy weight loss and take pressure off of weight bearing joints.   Vitamin D  deficiency Previously on ergocalciferol  50,000 units once weekly, but experienced constipation. Currently not taking any vitamin D  supplements. Previous vitamin D  level was low at 33.7. Discussed potential for vitamin D3 with K2 supplementation. Discussed the risks of supratherapeutic vitamin D  levels due to weight loss and the importance of monitoring levels. She is going to start OTC vitamin D  2000 units daily and monitor for constipation or other side effects. Low vitamin D  levels can be associated with adiposity and may result in leptin resistance and weight gain. Also associated with fatigue.  Currently on vitamin D  supplementation without any adverse effects such as nausea, vomiting or muscle weakness.  - Recheck vitamin D  levels next visit.    Vitals Temp: 98.4 F (36.9 C) BP: 106/72 Pulse Rate: (!) 176 SpO2: 99 %   Anthropometric Measurements Height: 5' 9.5" (1.765 m) Weight: 206 lb (93.4 kg) BMI (Calculated): 29.99 Weight at Last Visit: 208 lb Weight Lost Since Last Visit: 2 lb Weight Gained Since Last Visit: 0 Starting Weight: 219 lb Total Weight Loss (lbs): 13 lb (5.897 kg) Peak Weight: 320 lb   Body Composition  Body Fat %: 41.8 % Fat Mass (lbs): 86.2 lbs Muscle Mass (lbs): 114 lbs Total Body Water (lbs): 80 lbs Visceral Fat Rating : 9   Other Clinical Data Fasting: no Labs: no Today's Visit #: 10 Starting Date: 07/14/23     ASSESSMENT AND PLAN:  Diet: Kathleen Everett is currently in the action stage of change. As such, her goal is to continue with weight loss efforts. She has agreed to Category 2 Plan and keeping a food journal and adhering to recommended goals of 1100-1300 calories and 85 grams of  protein.  Exercise: Kathleen Everett has been instructed to try a geriatric exercise plan  and that some exercise is better than none for weight loss and overall health benefits.   Behavior Modification:  We discussed the following Behavioral Modification Strategies today: increasing lean protein intake, decreasing simple carbohydrates, increasing vegetables, increase H2O intake, increase high fiber foods, no skipping meals, avoiding temptations, and planning for success. We discussed various medication options to help Kathleen Everett with her weight loss efforts and we both agreed to increase metformin  to BID for primary indication of insulin  resistance and continue to work on nutritional and behavioral strategies to promote weight loss.  .  Return in about 6 weeks (around 05/11/2024).Kathleen Everett She was informed of the importance of frequent follow up visits to maximize her success with intensive lifestyle modifications for her multiple health conditions.  Attestation Statements:   Reviewed by clinician on day of visit: allergies, medications, problem list, medical history, surgical history, family history, social history, and previous encounter notes.   Time spent on visit including pre-visit chart review and post-visit care and charting was 35 minutes.    Rye Dorado, PA-C

## 2024-03-29 NOTE — Progress Notes (Signed)
 THERAPIST PROGRESS NOTE  Virtual Visit via Video Note  I connected with [name] on 03/29/24 at  9:00 AM EDT by a video enabled telemedicine application and verified that I am speaking with the correct person using two identifiers.  Location: Patient: address on file  Provider: [facility]   I discussed the limitations of evaluation and management by telemedicine and the availability of in person appointments. The patient expressed understanding and agreed to proceed.   I discussed the assessment and treatment plan with the patient. The patient was provided an opportunity to ask questions and all were answered. The patient agreed with the plan and demonstrated an understanding of the instructions.   The patient was advised to call back or seek an in-person evaluation if the symptoms worsen or if the condition fails to improve as anticipated.  I provided 47 minutes of non-face-to-face time during this encounter.   Marvin Slot, LCSW   Session Time: 9:14am-10:01am  Participation Level: Active  Behavioral Response: CasualAlertEuthymic  Type of Therapy: Individual Therapy  Treatment Goals addressed:  Goal: LTG: Reduce frequency, intensity, and duration of depression symptoms so that daily functioning is improved     Dates: Start:  02/06/24    Expected End:  08/08/24       Disciplines: Interdisciplinary, PROVIDER                       Goal: LTG: Increase coping skills to manage depression and improve ability to perform daily activities       ProgressTowards Goals: Progressing  Interventions: CBT, Supportive, and Reframing  Summary: Kathleen Everett is a 53 y.o. female who presents with Change in energy/activity; Difficulty Concentrating; Fatigue; Irritability; Sleep (too much or little); Tearfulness; Worthlessness; anxious feelings, and tension. Pt was oriented times 5. Pt was cooperative and engaged. Pt denies SI/HI/AVH.      The patient reflected on  the recent Mother's Day holiday. She reported that her anxiety and depression have been "okay," but shared that she felt emotional and tearful yesterday. She is experiencing financial strain due to her inability to pay bills and discussed her communication with her husband regarding expectations around bill payments and managing their finances. The stressors associated with her husband's business are also a concern. She tends to avoid managing finances and noted that her mental fog has affected their communication and ability to recall previous conversations about delegating responsibilities. This has impacted their marriage as they work together.  The patient expressed feelings of sadness when comparing her and her husband's life to others at their age. She became tearful while reflecting on her husband's physical pain while trying to provide for the family, as opposed to their hopes of being in retirement. She also reflected on her chronic pain and the consequences of stress on her overall well-being, discussing how her pain has made her feel more like an observer rather than an active participant in life.  Discussed stress management techniques. She shared that she used to love scrapbooking but now feels limited by her finances, which prevents her from engaging in this hobby as much as she would like. She also struggles with motivation and has noted that she does not socialize much.   The patient feels misplaced guilt related to her disability and not being able to contribute financially. Explored cognitive distortions and the importance of practicing gratitude regularly. The patient expressed she often lives in this realtiy of an ideal life, which leads her  to feel disspointment. She expressed a desire to feel more in control of her life. Clinician encouraged aptient to enage in gratitude work routinely.   For the next session, the cln will educate the patient on thought problems.   Suicidal/Homicidal:  Nowithout intent/plan  Therapist Response:  Cln utilized active and supportive reflection to assist in patient feeling safe to process. Cln assessed for current safety, symptoms, and stressors since last session.  Utilized CBT techniques to work with patient on reframing negative cognitions and work towards gratitude.  Clinician continues to check in with patient regarding check-in with neurology regarding beginning EMDR treatment due to medical history.  Plan: Return again in 2 weeks.  Diagnosis: Generalized anxiety disorder  MDD (major depressive disorder), recurrent, in partial remission (HCC)   Collaboration of Care: AEB psychiatrist can access notes and cln. Will review psychiatrists' notes. Check in with the patient and will see LCSW per availability. Patient agreed with treatment recommendations.   Patient/Guardian was advised Release of Information must be obtained prior to any record release in order to collaborate their care with an outside provider. Patient/Guardian was advised if they have not already done so to contact the registration department to sign all necessary forms in order for us  to release information regarding their care.   Consent: Patient/Guardian gives verbal consent for treatment and assignment of benefits for services provided during this visit. Patient/Guardian expressed understanding and agreed to proceed.   Marvin Slot, LCSW 03/29/2024

## 2024-03-30 ENCOUNTER — Ambulatory Visit (INDEPENDENT_AMBULATORY_CARE_PROVIDER_SITE_OTHER): Admitting: Physician Assistant

## 2024-03-30 ENCOUNTER — Encounter (INDEPENDENT_AMBULATORY_CARE_PROVIDER_SITE_OTHER): Payer: Self-pay | Admitting: Physician Assistant

## 2024-03-30 VITALS — BP 106/72 | HR 176 | Temp 98.4°F | Ht 69.5 in | Wt 206.0 lb

## 2024-03-30 DIAGNOSIS — M51379 Other intervertebral disc degeneration, lumbosacral region without mention of lumbar back pain or lower extremity pain: Secondary | ICD-10-CM | POA: Diagnosis not present

## 2024-03-30 DIAGNOSIS — G8929 Other chronic pain: Secondary | ICD-10-CM | POA: Diagnosis not present

## 2024-03-30 DIAGNOSIS — M47816 Spondylosis without myelopathy or radiculopathy, lumbar region: Secondary | ICD-10-CM | POA: Diagnosis not present

## 2024-03-30 DIAGNOSIS — E782 Mixed hyperlipidemia: Secondary | ICD-10-CM

## 2024-03-30 DIAGNOSIS — M545 Low back pain, unspecified: Secondary | ICD-10-CM

## 2024-03-30 DIAGNOSIS — G894 Chronic pain syndrome: Secondary | ICD-10-CM | POA: Diagnosis not present

## 2024-03-30 DIAGNOSIS — M533 Sacrococcygeal disorders, not elsewhere classified: Secondary | ICD-10-CM | POA: Diagnosis not present

## 2024-03-30 DIAGNOSIS — F3289 Other specified depressive episodes: Secondary | ICD-10-CM

## 2024-03-30 DIAGNOSIS — E559 Vitamin D deficiency, unspecified: Secondary | ICD-10-CM

## 2024-03-30 DIAGNOSIS — Z6830 Body mass index (BMI) 30.0-30.9, adult: Secondary | ICD-10-CM

## 2024-03-30 DIAGNOSIS — E88819 Insulin resistance, unspecified: Secondary | ICD-10-CM

## 2024-03-30 DIAGNOSIS — G2581 Restless legs syndrome: Secondary | ICD-10-CM | POA: Diagnosis not present

## 2024-03-30 DIAGNOSIS — M160 Bilateral primary osteoarthritis of hip: Secondary | ICD-10-CM | POA: Diagnosis not present

## 2024-03-30 DIAGNOSIS — M791 Myalgia, unspecified site: Secondary | ICD-10-CM | POA: Diagnosis not present

## 2024-03-30 DIAGNOSIS — E669 Obesity, unspecified: Secondary | ICD-10-CM

## 2024-03-30 MED ORDER — METFORMIN HCL 500 MG PO TABS: 500.0000 mg | ORAL_TABLET | Freq: Two times a day (BID) | ORAL | 1 refills | Status: DC

## 2024-03-31 ENCOUNTER — Ambulatory Visit

## 2024-04-06 ENCOUNTER — Ambulatory Visit (INDEPENDENT_AMBULATORY_CARE_PROVIDER_SITE_OTHER): Admitting: Licensed Clinical Social Worker

## 2024-04-06 DIAGNOSIS — F411 Generalized anxiety disorder: Secondary | ICD-10-CM

## 2024-04-06 DIAGNOSIS — F3341 Major depressive disorder, recurrent, in partial remission: Secondary | ICD-10-CM

## 2024-04-06 NOTE — Progress Notes (Signed)
 THERAPIST PROGRESS NOTE  Virtual Visit via Video Note  I connected with Kathleen Everett on 04/06/24 at  8:00 AM EDT by a video enabled telemedicine application and verified that I am speaking with the correct person using two identifiers.  Location: Patient: Address on file  Provider: [facility]   I discussed the limitations of evaluation and management by telemedicine and the availability of in person appointments. The patient expressed understanding and agreed to proceed.     I discussed the assessment and treatment plan with the patient. The patient was provided an opportunity to ask questions and all were answered. The patient agreed with the plan and demonstrated an understanding of the instructions.   The patient was advised to call back or seek an in-person evaluation if the symptoms worsen or if the condition fails to improve as anticipated.  I provided 52 minutes of non-face-to-face time during this encounter.   Marvin Slot, LCSW   Session Time: 8-8:52am  Participation Level: Active  Behavioral Response: CasualAlertEuthymic  Type of Therapy: Individual Therapy  Treatment Goals addressed:  Goal: LTG: Reduce frequency, intensity, and duration of depression symptoms so that daily functioning is improved     Dates: Start:  02/06/24    Expected End:  08/08/24       Disciplines: Interdisciplinary, PROVIDER                              Goal: LTG: Increase coping skills to manage depression and improve ability to perform daily activities      ProgressTowards Goals: Progressing  Interventions: CBT, Supportive, and Reframing  Summary: Kathleen Everett is a 53 y.o. female who presents with Change in energy/activity; Difficulty Concentrating; Fatigue; Irritability; Sleep (too much or little); Tearfulness; Worthlessness; anxious feelings, and tension. Pt was oriented times 5. Pt was cooperative and engaged. Pt denies SI/HI/AVH.      Patient reflected on a  recent fall and her upcoming meeting with a pain doctor to undergo a procedure aimed at stabilizing her joints in order to address chronic pain.  She expressed anxiety about unresolved tasks at home, mentioning that she is easily distracted and is exploring a diagnosis of ADHD. She shared the results of a previous ADHD evaluation.  The clinician provided psychoeducation related to her experiences with various cognitive distortions. The patient identified common tendencies to personalize situations, engage in mind-reading, catastrophize, ignore positive aspects, and experience negative thoughts related to fairness. She expressed feelings of negativity regarding her acceptance by others and noted that her anxiety often stems from feeling out of control.   The clinician encouraged her to be more mindful before her next session by recognizing when she is falling into these thinking traps. They plan to work on strategies to challenge these negative thought patterns in the next session.     Suicidal/Homicidal: Nowithout intent/plan  Therapist Response: Cln utilized active and supportive reflection to assist in patient feeling safe to process. Cln assessed for current safety, symptoms, and stressors since last session.  Provided psychoeducation on cognitive distortions and ways in which patient can begin to identify negative thinking patterns.  Plan: Return again in 2 weeks.  Diagnosis: Generalized anxiety disorder  MDD (major depressive disorder), recurrent, in partial remission (HCC)   Collaboration of Care: AEB psychiatrist can access notes and cln. Will review psychiatrists' notes. Check in with the patient and will see LCSW per availability. Patient agreed with treatment recommendations.  Patient/Guardian was advised Release of Information must be obtained prior to any record release in order to collaborate their care with an outside provider. Patient/Guardian was advised if they have not  already done so to contact the registration department to sign all necessary forms in order for us  to release information regarding their care.   Consent: Patient/Guardian gives verbal consent for treatment and assignment of benefits for services provided during this visit. Patient/Guardian expressed understanding and agreed to proceed.   Marvin Slot, LCSW 04/06/2024

## 2024-04-13 DIAGNOSIS — M1612 Unilateral primary osteoarthritis, left hip: Secondary | ICD-10-CM | POA: Diagnosis not present

## 2024-04-14 ENCOUNTER — Telehealth: Payer: Self-pay

## 2024-04-14 DIAGNOSIS — G43019 Migraine without aura, intractable, without status migrainosus: Secondary | ICD-10-CM | POA: Diagnosis not present

## 2024-04-14 DIAGNOSIS — G935 Compression of brain: Secondary | ICD-10-CM | POA: Diagnosis not present

## 2024-04-14 NOTE — Telephone Encounter (Signed)
 Pelvic US  that was scheduled for 03/31/24 was cancelled by patient on 03/31/24. At this time, she has not called to reschedule.

## 2024-04-15 ENCOUNTER — Other Ambulatory Visit: Payer: Self-pay | Admitting: Orthopedic Surgery

## 2024-04-22 ENCOUNTER — Encounter: Payer: Self-pay | Admitting: Licensed Clinical Social Worker

## 2024-04-23 ENCOUNTER — Ambulatory Visit: Admitting: Licensed Clinical Social Worker

## 2024-04-23 DIAGNOSIS — F411 Generalized anxiety disorder: Secondary | ICD-10-CM | POA: Diagnosis not present

## 2024-04-23 DIAGNOSIS — F3341 Major depressive disorder, recurrent, in partial remission: Secondary | ICD-10-CM

## 2024-04-23 NOTE — Progress Notes (Signed)
 THERAPIST PROGRESS NOTE  Virtual Visit via Video Note  I connected with Kathleen Everett on 04/23/24 at  8:00 AM EDT by a video enabled telemedicine application and verified that I am speaking with the correct person using two identifiers.  Location: Patient: Address on file  Provider: Providers Address   I discussed the limitations of evaluation and management by telemedicine and the availability of in person appointments. The patient expressed understanding and agreed to proceed.   I discussed the assessment and treatment plan with the patient. The patient was provided an opportunity to ask questions and all were answered. The patient agreed with the plan and demonstrated an understanding of the instructions.   The patient was advised to call back or seek an in-person evaluation if the symptoms worsen or if the condition fails to improve as anticipated.  I provided 57 minutes of non-face-to-face time during this encounter.   Marvin Slot, LCSW   Session Time: 8-8:57am  Participation Level: Active  Behavioral Response: CasualAlertEuthymic  Type of Therapy: Individual Therapy  Treatment Goals addressed:  Goal: LTG: Reduce frequency, intensity, and duration of depression symptoms so that daily functioning is improved     Dates: Start:  02/06/24    Expected End:  08/08/24       Disciplines: Interdisciplinary, PROVIDER                               Goal: LTG: Increase coping skills to manage depression and improve ability to perform daily activities        ProgressTowards Goals: Progressing  Interventions: Solution Focused, Assertiveness Training, and Supportive  Summary: Kathleen Everett is a 53 y.o. female who presents with Change in energy/activity; Difficulty Concentrating; Fatigue; Irritability; Sleep (too much or little); Tearfulness; Worthlessness; anxious feelings, and tension. Pt was oriented times 5. Pt was cooperative and engaged. Pt denies  SI/HI/AVH.      Reflected on the upcoming surgery and identified hopes for improved mobility and reduced pain levels afterward.   Spoke with the neurologist about starting EMDR treatment. The patient provided consent to begin EMDR in the upcoming sessions.   We discussed thinking traps and the patient's observations about her thought patterns. She reflected on a recent family gathering and her feelings of disrespect. Patient reflected on a heart-to-heart conversation with her daughter about her sense of distance from her. The clinician and the patient identified that these encounters reinforce the negative belief, "I'm not good enough."  The patient expressed stress related to finances, emphasizing the importance of fostering a supportive environment for their children as the family begins to expand. She feels misplaced guilt regarding her disability and limited income, wanting her family to be financially better off. Additionally, she senses judgment from others about their financial stability.   Cln and patient role played conversations she can begin to have with loved ones around her concerns and observations.   Suicidal/Homicidal: Nowithout intent/plan  Therapist Response: Cln utilized active and supportive reflection to assist in patient feeling safe to process. Cln assessed for current safety, symptoms, and stressors since last session.  Provided psychoeducation on cognitive distortions and ways in which patient can begin to identify negative thinking patterns. Explored uses of assertive communication to establish healthier connections with loved ones.   Plan: Return again in 2 weeks.  Diagnosis: Generalized anxiety disorder  MDD (major depressive disorder), recurrent, in partial remission (HCC)   Collaboration of Care:  AE[facility] psychiatrist can access notes and cln. Will review psychiatrists' notes. Check in with the patient and will see LCSW per availability. Patient agreed with treatment  recommendations.   Patient/Guardian was advised Release of Information must be obtained prior to any record release in order to collaborate their care with an outside provider. Patient/Guardian was advised if they have not already done so to contact the registration department to sign all necessary forms in order for us  to release information regarding their care.   Consent: Patient/Guardian gives verbal consent for treatment and assignment of benefits for services provided during this visit. Patient/Guardian expressed understanding and agreed to proceed.   Marvin Slot, LCSW 04/23/2024

## 2024-04-30 ENCOUNTER — Other Ambulatory Visit: Payer: Self-pay

## 2024-04-30 ENCOUNTER — Encounter
Admission: RE | Admit: 2024-04-30 | Discharge: 2024-04-30 | Disposition: A | Discharge status: Home / Self Care | Source: Ambulatory Visit | Attending: Orthopedic Surgery | Admitting: Orthopedic Surgery

## 2024-04-30 VITALS — BP 117/80 | HR 70 | Temp 97.2°F | Resp 16

## 2024-04-30 DIAGNOSIS — Z01818 Encounter for other preprocedural examination: Secondary | ICD-10-CM | POA: Insufficient documentation

## 2024-04-30 DIAGNOSIS — I341 Nonrheumatic mitral (valve) prolapse: Secondary | ICD-10-CM | POA: Diagnosis not present

## 2024-04-30 DIAGNOSIS — Z01812 Encounter for preprocedural laboratory examination: Secondary | ICD-10-CM

## 2024-04-30 LAB — URINALYSIS, ROUTINE W REFLEX MICROSCOPIC
Bilirubin Urine: NEGATIVE
Glucose, UA: NEGATIVE mg/dL
Hgb urine dipstick: NEGATIVE
Ketones, ur: NEGATIVE mg/dL
Leukocytes,Ua: NEGATIVE
Nitrite: NEGATIVE
Protein, ur: NEGATIVE mg/dL
Specific Gravity, Urine: 1.018 (ref 1.005–1.030)
pH: 5 (ref 5.0–8.0)

## 2024-04-30 LAB — CBC WITH DIFFERENTIAL/PLATELET
Abs Immature Granulocytes: 0.01 10*3/uL (ref 0.00–0.07)
Basophils Absolute: 0 10*3/uL (ref 0.0–0.1)
Basophils Relative: 0 %
Eosinophils Absolute: 0.1 10*3/uL (ref 0.0–0.5)
Eosinophils Relative: 2 %
HCT: 40.2 % (ref 36.0–46.0)
Hemoglobin: 13.2 g/dL (ref 12.0–15.0)
Immature Granulocytes: 0 %
Lymphocytes Relative: 31 %
Lymphs Abs: 1.4 10*3/uL (ref 0.7–4.0)
MCH: 29.7 pg (ref 26.0–34.0)
MCHC: 32.8 g/dL (ref 30.0–36.0)
MCV: 90.3 fL (ref 80.0–100.0)
Monocytes Absolute: 0.3 10*3/uL (ref 0.1–1.0)
Monocytes Relative: 7 %
Neutro Abs: 2.7 10*3/uL (ref 1.7–7.7)
Neutrophils Relative %: 60 %
Platelets: 215 10*3/uL (ref 150–400)
RBC: 4.45 MIL/uL (ref 3.87–5.11)
RDW: 12.2 % (ref 11.5–15.5)
WBC: 4.5 10*3/uL (ref 4.0–10.5)
nRBC: 0 % (ref 0.0–0.2)

## 2024-04-30 LAB — COMPREHENSIVE METABOLIC PANEL WITH GFR
ALT: 14 U/L (ref 0–44)
AST: 15 U/L (ref 15–41)
Albumin: 4.2 g/dL (ref 3.5–5.0)
Alkaline Phosphatase: 56 U/L (ref 38–126)
Anion gap: 9 (ref 5–15)
BUN: 19 mg/dL (ref 6–20)
CO2: 28 mmol/L (ref 22–32)
Calcium: 9.3 mg/dL (ref 8.9–10.3)
Chloride: 104 mmol/L (ref 98–111)
Creatinine, Ser: 0.58 mg/dL (ref 0.44–1.00)
GFR, Estimated: >60 mL/min (ref >60)
Glucose, Bld: 73 mg/dL (ref 70–99)
Potassium: 4 mmol/L (ref 3.5–5.1)
Sodium: 141 mmol/L (ref 135–145)
Total Bilirubin: 0.7 mg/dL (ref 0.0–1.2)
Total Protein: 7.1 g/dL (ref 6.5–8.1)

## 2024-04-30 LAB — SURGICAL PCR SCREEN
MRSA, PCR: NEGATIVE
Staphylococcus aureus: NEGATIVE

## 2024-04-30 LAB — TYPE AND SCREEN
ABO/RH(D): A POS
Antibody Screen: NEGATIVE

## 2024-04-30 NOTE — Patient Instructions (Addendum)
 Your procedure is scheduled on:  Thursday June 26  Report to the Registration Desk on the 1st floor of the [facility] To find out your arrival time, please call [phone] between 1PM - 3PM on:  Wednesday June 25  If your arrival time is 6:00 am, do not arrive before that time as the [facility] entrance doors do not open until 6:00 am.  REMEMBER: Instructions that are not followed completely may result in serious medical risk, up to and including death; or upon the discretion of your surgeon and anesthesiologist your surgery may need to be rescheduled.  Do not eat food after midnight the night before surgery.  No gum chewing or hard candies.  You may however, drink CLEAR liquids up to 2 hours before you are scheduled to arrive for your surgery. Do not drink anything within 2 hours of your scheduled arrival time.  Clear liquids include: - water  - apple juice without pulp - gatorade (not RED colors) - black coffee or tea (Do NOT add milk or creamers to the coffee or tea) Do NOT drink anything that is not on this list.  In addition, your doctor has ordered for you to drink the provided:  Ensure Pre-Surgery Clear Carbohydrate Drink  Drinking this carbohydrate drink up to two hours before surgery helps to reduce insulin  resistance and improve patient outcomes. Please complete drinking 3 hours before scheduled arrival time.  One week prior to surgery:THURSDAY 19  Stop Anti-inflammatories (NSAIDS) such as Advil , Aleve , Ibuprofen , Motrin , Naproxen , Naprosyn  and Aspirin based products such as Excedrin, Goody's Powder, BC Powder. Stop ANY OVER THE COUNTER supplements until after surgery. azelastine  (ASTELIN )   You may however, continue to take Tylenol  if needed for pain up until the day of surgery. metFORMIN  (GLUCOPHAGE ) hold 3 days prior to surgery, last dose MONDAY JUNE 23   Continue taking all of your other prescription medications up until the day of surgery.  ON THE DAY OF  SURGERY ONLY TAKE THESE MEDICATIONS WITH SIPS OF WATER:  buPROPion  (WELLBUTRIN  XL)  HYDROcodone -acetaminophen  (NORCO)   No Alcohol  for 24 hours before or after surgery.  Do not use any recreational drugs for at least a week (preferably 2 weeks) before your surgery.  Please be advised that the combination of cocaine and anesthesia may have negative outcomes, up to and including death. If you test positive for cocaine, your surgery will be cancelled.  On the morning of surgery brush your teeth with toothpaste and water, you may rinse your mouth with mouthwash if you wish. Do not swallow any toothpaste or mouthwash.  Use CHG Soap or wipes as directed on instruction sheet.  Do not wear jewelry, make-up, hairpins, clips or nail polish.  For welded (permanent) jewelry: bracelets, anklets, waist bands, etc.  Please have this removed prior to surgery.  If it is not removed, there is a chance that hospital personnel will need to cut it off on the day of surgery.  Do not wear lotions, powders, or perfumes.   Do not shave body hair from the neck down 48 hours before surgery.  Contact lenses, hearing aids and dentures may not be worn into surgery.  Do not bring valuables to the hospital. [facility] is not responsible for any missing/lost belongings or valuables.   Notify your doctor if there is any change in your medical condition (cold, fever, infection).  Wear comfortable clothing (specific to your surgery type) to the hospital.  After surgery, you can help prevent  lung complications by doing breathing exercises.  Take deep breaths and cough every 1-2 hours. Your doctor may order a device called an Incentive Spirometer to help you take deep breaths. When coughing or sneezing, hold a pillow firmly against your incision with both hands. This is called "splinting." Doing this helps protect your incision. It also decreases belly discomfort.  If you are being admitted to the hospital  overnight, leave your suitcase in the car. After surgery it may be brought to your room.  In case of increased patient census, it may be necessary for you, the patient, to continue your postoperative care in the Same Day Surgery department.  If you are being discharged the day of surgery, you will not be allowed to drive home. You will need a responsible individual to drive you home and stay with you for 24 hours after surgery.   If you are taking public transportation, you will need to have a responsible individual with you.  Please call the Pre-admissions Testing Dept. at [phone] if you have any questions about these instructions.  Surgery Visitation Policy:  Patients having surgery or a procedure may have two visitors.  Children under the age of 27 must have an adult with them who is not the patient.  Inpatient Visitation:    Visiting hours are 7 a.m. to 8 p.m. Up to four visitors are allowed at one time in a patient room. The visitors may rotate out with other people during the day.  One visitor age 97 or older may stay with the patient overnight and must be in the room by 8 p.m.     Pre-operative 5 CHG Bath Instructions   You can play a key role in reducing the risk of infection after surgery. Your skin needs to be as free of germs as possible. You can reduce the number of germs on your skin by washing with CHG (chlorhexidine  gluconate) soap before surgery. CHG is an antiseptic soap that kills germs and continues to kill germs even after washing.   DO NOT use if you have an allergy to chlorhexidine /CHG or antibacterial soaps. If your skin becomes reddened or irritated, stop using the CHG and notify one of our RNs at [phone].   Please shower with the CHG soap starting 4 days before surgery using the following schedule:   STARTING SUNDAY JUNE 22    Please keep in mind the following:  DO NOT shave, including legs and underarms, starting the day of your first  shower.   You may shave your face at any point before/day of surgery.  Place clean sheets on your bed the day you start using CHG soap. Use a clean washcloth (not used since being washed) for each shower. DO NOT sleep with pets once you start using the CHG.   CHG Shower Instructions:  If you choose to wash your hair and private area, wash first with your normal shampoo/soap.  After you use shampoo/soap, rinse your hair and body thoroughly to remove shampoo/soap residue.  Turn the water OFF and apply about 3 tablespoons (45 ml) of CHG soap to a CLEAN washcloth.  Apply CHG soap ONLY FROM YOUR NECK DOWN TO YOUR TOES (washing for 3-5 minutes)  DO NOT use CHG soap on face, private areas, open wounds, or sores.  Pay special attention to the area where your surgery is being performed.  If you are having back surgery, having someone wash your back for you may be helpful. Wait 2 minutes  after CHG soap is applied, then you may rinse off the CHG soap.  Pat dry with a clean towel  Put on clean clothes/pajamas   If you choose to wear lotion, please use ONLY the CHG-compatible lotions on the back of this paper.     Additional instructions for the day of surgery: DO NOT APPLY any lotions, deodorants, cologne, or perfumes.   Put on clean/comfortable clothes.  Brush your teeth.  Ask your nurse before applying any prescription medications to the skin.      CHG Compatible Lotions   Aveeno Moisturizing lotion  Cetaphil Moisturizing Cream  Cetaphil Moisturizing Lotion  Clairol Herbal Essence Moisturizing Lotion, Dry Skin  Clairol Herbal Essence Moisturizing Lotion, Extra Dry Skin  Clairol Herbal Essence Moisturizing Lotion, Normal Skin  Curel Age Defying Therapeutic Moisturizing Lotion with Alpha Hydroxy  Curel Extreme Care Body Lotion  Curel Soothing Hands Moisturizing Hand Lotion  Curel Therapeutic Moisturizing Cream, Fragrance-Free  Curel Therapeutic Moisturizing Lotion, Fragrance-Free  Curel  Therapeutic Moisturizing Lotion, Original Formula  Eucerin Daily Replenishing Lotion  Eucerin Dry Skin Therapy Plus Alpha Hydroxy Crme  Eucerin Dry Skin Therapy Plus Alpha Hydroxy Lotion  Eucerin Original Crme  Eucerin Original Lotion  Eucerin Plus Crme Eucerin Plus Lotion  Eucerin TriLipid Replenishing Lotion  Keri Anti-Bacterial Hand Lotion  Keri Deep Conditioning Original Lotion Dry Skin Formula Softly Scented  Keri Deep Conditioning Original Lotion, Fragrance Free Sensitive Skin Formula  Keri Lotion Fast Absorbing Fragrance Free Sensitive Skin Formula  Keri Lotion Fast Absorbing Softly Scented Dry Skin Formula  Keri Original Lotion  Keri Skin Renewal Lotion Keri Silky Smooth Lotion  Keri Silky Smooth Sensitive Skin Lotion  Nivea Body Creamy Conditioning Oil  Nivea Body Extra Enriched Lotion  Nivea Body Original Lotion  Nivea Body Sheer Moisturizing Lotion Nivea Crme  Nivea Skin Firming Lotion  NutraDerm 30 Skin Lotion  NutraDerm Skin Lotion  NutraDerm Therapeutic Skin Cream  NutraDerm Therapeutic Skin Lotion  ProShield Protective Hand Cream  Provon moisturizing lotion   How to Use an Incentive Spirometer  An incentive spirometer is a tool that measures how well you are filling your lungs with each breath. Learning to take long, deep breaths using this tool can help you keep your lungs clear and active. This may help to reverse or lessen your chance of developing breathing (pulmonary) problems, especially infection. You may be asked to use a spirometer: After a surgery. If you have a lung problem or a history of smoking. After a long period of time when you have been unable to move or be active. If the spirometer includes an indicator to show the highest number that you have reached, your health care provider or respiratory therapist will help you set a goal. Keep a log of your progress as told by your health care provider. What are the risks? Breathing too quickly may  cause dizziness or cause you to pass out. Take your time so you do not get dizzy or light-headed. If you are in pain, you may need to take pain medicine before doing incentive spirometry. It is harder to take a deep breath if you are having pain. How to use your incentive spirometer  Sit up on the edge of your bed or on a chair. Hold the incentive spirometer so that it is in an upright position. Before you use the spirometer, breathe out normally. Place the mouthpiece in your mouth. Make sure your lips are closed tightly around it. Breathe in slowly and as  deeply as you can through your mouth, causing the piston or the ball to rise toward the top of the chamber. Hold your breath for 3-5 seconds, or for as long as possible. If the spirometer includes a coach indicator, use this to guide you in breathing. Slow down your breathing if the indicator goes above the marked areas. Remove the mouthpiece from your mouth and breathe out normally. The piston or ball will return to the bottom of the chamber. Rest for a few seconds, then repeat the steps 10 or more times. Take your time and take a few normal breaths between deep breaths so that you do not get dizzy or light-headed. Do this every 1-2 hours when you are awake. If the spirometer includes a goal marker to show the highest number you have reached (best effort), use this as a goal to work toward during each repetition. After each set of 10 deep breaths, cough a few times. This will help to make sure that your lungs are clear. If you have an incision on your chest or abdomen from surgery, place a pillow or a rolled-up towel firmly against the incision when you cough. This can help to reduce pain while taking deep breaths and coughing. General tips When you are able to get out of bed: Walk around often. Continue to take deep breaths and cough in order to clear your lungs. Keep using the incentive spirometer until your health care provider says it is  okay to stop using it. If you have been in the hospital, you may be told to keep using the spirometer at home. Contact a health care provider if: You are having difficulty using the spirometer. You have trouble using the spirometer as often as instructed. Your pain medicine is not giving enough relief for you to use the spirometer as told. You have a fever. Get help right away if: You develop shortness of breath. You develop a cough with bloody mucus from the lungs. You have fluid or blood coming from an incision site after you cough. Summary An incentive spirometer is a tool that can help you learn to take long, deep breaths to keep your lungs clear and active. You may be asked to use a spirometer after a surgery, if you have a lung problem or a history of smoking, or if you have been inactive for a long period of time. Use your incentive spirometer as instructed every 1-2 hours while you are awake. If you have an incision on your chest or abdomen, place a pillow or a rolled-up towel firmly against your incision when you cough. This will help to reduce pain. Get help right away if you have shortness of breath, you cough up bloody mucus, or blood comes from your incision when you cough. This information is not intended to replace advice given to you by your health care provider. Make sure you discuss any questions you have with your health care provider. Document Revised: 01/24/2020 Document Reviewed: 01/24/2020 Elsevier Patient Education  2023 Elsevier Inc.         Preoperative Educational Videos for Total Hip, Knee and Shoulder Replacements  To better prepare for surgery, please view our videos that explain the physical activity and discharge planning required to have the best surgical recovery at [facility].  [email or web address]  Questions? Call [phone] or email  [email or web address]

## 2024-05-04 ENCOUNTER — Ambulatory Visit (INDEPENDENT_AMBULATORY_CARE_PROVIDER_SITE_OTHER): Admitting: Licensed Clinical Social Worker

## 2024-05-04 ENCOUNTER — Encounter: Payer: Self-pay | Admitting: Licensed Clinical Social Worker

## 2024-05-04 DIAGNOSIS — F3341 Major depressive disorder, recurrent, in partial remission: Secondary | ICD-10-CM | POA: Diagnosis not present

## 2024-05-04 DIAGNOSIS — F411 Generalized anxiety disorder: Secondary | ICD-10-CM | POA: Diagnosis not present

## 2024-05-04 NOTE — Progress Notes (Signed)
   THERAPIST PROGRESS NOTE Virtual Visit via Video Note  I connected with Kathleen Everett on 05/04/24 at  8:00 AM EDT by a video enabled telemedicine application and verified that I am speaking with the correct person using two identifiers.  Location: Patient: Address on file  Provider: [facility]   I discussed the limitations of evaluation and management by telemedicine and the availability of in person appointments. The patient expressed understanding and agreed to proceed.   I discussed the assessment and treatment plan with the patient. The patient was provided an opportunity to ask questions and all were answered. The patient agreed with the plan and demonstrated an understanding of the instructions.   The patient was advised to call back or seek an in-person evaluation if the symptoms worsen or if the condition fails to improve as anticipated.  I provided 52 minutes of non-face-to-face time during this encounter.   Marvin Slot, LCSW  Session Time: 8-8:52am  Participation Level: Active  Behavioral Response: CasualAlertEuthymic  Type of Therapy: Individual Therapy  Treatment Goals addressed:  Goal: LTG: Reduce frequency, intensity, and duration of depression symptoms so that daily functioning is improved     Dates: Start:  02/06/24    Expected End:  08/08/24       Disciplines: Interdisciplinary, PROVIDER                               Goal: LTG: Increase coping skills to manage depression and improve ability to perform daily activities           ProgressTowards Goals: Progressing  Interventions: Supportive and Other: EMDR, Mindfulness  Summary: Kathleen Everett is a 53 y.o. female who presents with Change in energy/activity; Difficulty Concentrating; Fatigue; Irritability; Sleep (too much or little); Tearfulness; Worthlessness; anxious feelings, and tension. Pt was oriented times 5. Pt was cooperative and engaged. Pt denies SI/HI/AVH.      Patient  processed feelings about upcoming surgery. Reports she is hopeful and feels the rest is needed.  Patient began EMDR by resourcing. Cln educated the patient on 7-11 breathing, acupressure breathing, belly breathing and eye roll breathing. The patient reports eye roll breathing and bilateral tapping to be the most effective and will practice these skills routinely.    Suicidal/Homicidal: Nowithout intent/plan  Therapist Response:  Cln utilized active and supportive reflection to assist in patient feeling safe to process. Cln assessed for current safety, symptoms, and stressors since last session.  Began EMDR and educated patient on coping skills.   Plan: Return again in 2 weeks.  Diagnosis: Generalized anxiety disorder  MDD (major depressive disorder), recurrent, in partial remission (HCC)   Collaboration of Care: AEB psychiatrist can access notes and cln. Will review psychiatrists' notes. Check in with the patient and will see LCSW per availability. Patient agreed with treatment recommendations.   Patient/Guardian was advised Release of Information must be obtained prior to any record release in order to collaborate their care with an outside provider. Patient/Guardian was advised if they have not already done so to contact the registration department to sign all necessary forms in order for us  to release information regarding their care.   Consent: Patient/Guardian gives verbal consent for treatment and assignment of benefits for services provided during this visit. Patient/Guardian expressed understanding and agreed to proceed.   Marvin Slot, LCSW 05/04/2024

## 2024-05-06 ENCOUNTER — Encounter: Payer: Self-pay | Admitting: Nurse Practitioner

## 2024-05-06 ENCOUNTER — Ambulatory Visit (INDEPENDENT_AMBULATORY_CARE_PROVIDER_SITE_OTHER): Admitting: Nurse Practitioner

## 2024-05-06 VITALS — BP 110/62 | HR 84 | Temp 98.4°F | Resp 20 | Ht 69.5 in | Wt 205.5 lb

## 2024-05-06 DIAGNOSIS — J069 Acute upper respiratory infection, unspecified: Secondary | ICD-10-CM | POA: Diagnosis not present

## 2024-05-06 NOTE — Progress Notes (Unsigned)
 Bluford Burkitt, NP-C Phone: [phone]  Kathleen Everett is a 53 y.o. female who presents today for sore throat.   ***  Social History   Tobacco Use  Smoking Status Never  Smokeless Tobacco Never    Current Outpatient Medications on File Prior to Visit  Medication Sig Dispense Refill   acetaminophen  (TYLENOL ) 325 MG tablet Take 650 mg by mouth every 6 (six) hours as needed for moderate pain (pain score 4-6).     AIMOVIG 140 MG/ML SOAJ Inject 140 mg into the skin every 28 (twenty-eight) days.     atorvastatin  (LIPITOR) 20 MG tablet Take 1 tablet (20 mg total) by mouth daily. 90 tablet 1   azelastine  (ASTELIN ) 0.1 % nasal spray Place 1 spray into both nostrils 2 (two) times daily. Use in each nostril as directed (Patient taking differently: Place 1 spray into both nostrils 2 (two) times daily as needed for allergies or rhinitis. Use in each nostril as directed) 30 mL 2   buPROPion  (WELLBUTRIN  XL) 300 MG 24 hr tablet Take 1 tablet (300 mg total) by mouth daily. 450 mg daily. Take along with 150 mg tab (Patient taking differently: Take 300 mg by mouth daily.) 90 tablet 1   Crisaborole  (EUCRISA ) 2 % OINT Apply twice a day to hands prn flares 60 g 2   cyanocobalamin  (VITAMIN B12) 1000 MCG/ML injection INJECT 1 ML INTRAMUSCULARLY  ONCE EVERY MONTH 10 mL 0   diclofenac Sodium (VOLTAREN) 1 % GEL Apply 2 g topically daily as needed (pain).     esomeprazole  (NEXIUM ) 40 MG capsule Take 1 capsule (40 mg total) by mouth 2 (two) times daily before a meal. 90 capsule 3   estradiol  (ESTRACE ) 0.1 MG/GM vaginal cream Place 1 Applicatorful vaginally 4 (four) times a week. At bedtime 42.5 g 0   estradiol  (VIVELLE -DOT) 0.05 MG/24HR patch Place 1 patch (0.05 mg total) onto the skin 2 (two) times a week. 8 patch 6   HYDROcodone -acetaminophen  (NORCO) 10-325 MG tablet Take 1 tablet by mouth in the morning, at noon, in the evening, and at bedtime.     ibuprofen  (ADVIL ) 800 MG tablet Take 800 mg by mouth every  8 (eight) hours as needed for moderate pain (pain score 4-6).     metFORMIN  (GLUCOPHAGE ) 500 MG tablet Take 1 tablet (500 mg total) by mouth 2 (two) times daily with a meal. 60 tablet 1   methocarbamol  (ROBAXIN ) 500 MG tablet Take 500 mg by mouth 4 (four) times daily.     rizatriptan (MAXALT) 10 MG tablet Take 10 mg by mouth as needed for migraine. May repeat in 2 hours if needed     sertraline  (ZOLOFT ) 100 MG tablet Take 1.5 tablets (150 mg total) by mouth at bedtime. 135 tablet 0   topiramate  (TOPAMAX ) 25 MG tablet Take 50 mg by mouth 2 (two) times daily.     Ubrogepant (UBRELVY) 100 MG TABS Take 100 mg by mouth 2 (two) times daily as needed (migraine).     XTAMPZA  ER 13.5 MG C12A Take 13.5 mg by mouth 2 (two) times daily.     No current facility-administered medications on file prior to visit.     ROS see history of present illness  Objective  Physical Exam Vitals:   05/06/24 1450  BP: 110/62  Pulse: 84  Resp: 20  Temp: 98.4 F (36.9 C)  SpO2: 99%    BP Readings from Last 3 Encounters:  05/06/24 110/62  04/30/24 117/80  03/30/24 106/72  Wt Readings from Last 3 Encounters:  05/06/24 205 lb 8 oz (93.2 kg)  03/30/24 206 lb (93.4 kg)  03/17/24 209 lb 14.4 oz (95.2 kg)    Physical Exam Constitutional:      General: She is not in acute distress.    Appearance: Normal appearance.  HENT:     Head: Normocephalic.     Right Ear: Tympanic membrane normal.     Left Ear: Tympanic membrane normal.     Nose: Nose normal.     Mouth/Throat:     Mouth: Mucous membranes are moist.     Pharynx: Oropharynx is clear. Posterior oropharyngeal erythema (mild, irritated) present.   Eyes:     Conjunctiva/sclera: Conjunctivae normal.     Pupils: Pupils are equal, round, and reactive to light.    Cardiovascular:     Rate and Rhythm: Normal rate and regular rhythm.     Heart sounds: Normal heart sounds.  Pulmonary:     Effort: Pulmonary effort is normal.     Breath sounds: Normal  breath sounds.  Abdominal:     General: Abdomen is flat. Bowel sounds are normal.     Palpations: Abdomen is soft. There is no mass.     Tenderness: There is no abdominal tenderness.  Lymphadenopathy:     Cervical: No cervical adenopathy.   Skin:    General: Skin is warm and dry.   Neurological:     General: No focal deficit present.     Mental Status: She is alert.   Psychiatric:        Mood and Affect: Mood normal.        Behavior: Behavior normal.      Assessment/Plan: Please see individual problem list.  There are no diagnoses linked to this encounter.    No follow-ups on file.   Bluford Burkitt, NP-C [facility]

## 2024-05-07 ENCOUNTER — Telehealth (INDEPENDENT_AMBULATORY_CARE_PROVIDER_SITE_OTHER): Admitting: Psychiatry

## 2024-05-07 ENCOUNTER — Encounter: Payer: Self-pay | Admitting: Psychiatry

## 2024-05-07 DIAGNOSIS — F3341 Major depressive disorder, recurrent, in partial remission: Secondary | ICD-10-CM | POA: Diagnosis not present

## 2024-05-07 DIAGNOSIS — F411 Generalized anxiety disorder: Secondary | ICD-10-CM
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
## 2024-05-10 ENCOUNTER — Telehealth: Payer: Self-pay

## 2024-05-10 NOTE — Telephone Encounter (Signed)
 Copied from CRM [id number]. Topic: General - Other >> May 10, 2024  9:26 AM Kathleen Everett wrote: Reason for CRM: Orthopedics Office fax over surgical clearance for pt - pt is scheduled for surgery Thursday. Office can be reached [phone]

## 2024-05-10 NOTE — Telephone Encounter (Signed)
 Pt scheduled

## 2024-05-10 NOTE — Telephone Encounter (Signed)
 FYI she is having a total hip done Thursday 6/26. I have requested a copy of the pre-op form because we have not received

## 2024-05-10 NOTE — Telephone Encounter (Signed)
 See if she can come in at 3:00 tomorrow for pre op

## 2024-05-11 ENCOUNTER — Ambulatory Visit (INDEPENDENT_AMBULATORY_CARE_PROVIDER_SITE_OTHER): Admitting: Internal Medicine

## 2024-05-11 ENCOUNTER — Ambulatory Visit (INDEPENDENT_AMBULATORY_CARE_PROVIDER_SITE_OTHER): Admitting: Physician Assistant

## 2024-05-11 ENCOUNTER — Other Ambulatory Visit: Payer: Self-pay

## 2024-05-11 ENCOUNTER — Encounter: Payer: Self-pay | Admitting: Internal Medicine

## 2024-05-11 VITALS — BP 116/72 | HR 71 | Temp 97.9°F | Resp 16 | Ht 70.0 in | Wt 205.0 lb

## 2024-05-11 DIAGNOSIS — Z01818 Encounter for other preprocedural examination: Secondary | ICD-10-CM

## 2024-05-11 DIAGNOSIS — M545 Low back pain, unspecified: Secondary | ICD-10-CM | POA: Diagnosis not present

## 2024-05-11 DIAGNOSIS — F325 Major depressive disorder, single episode, in full remission: Secondary | ICD-10-CM

## 2024-05-11 DIAGNOSIS — R739 Hyperglycemia, unspecified: Secondary | ICD-10-CM

## 2024-05-11 DIAGNOSIS — D649 Anemia, unspecified: Secondary | ICD-10-CM | POA: Diagnosis not present

## 2024-05-11 DIAGNOSIS — Q0700 Arnold-Chiari syndrome without spina bifida or hydrocephalus: Secondary | ICD-10-CM | POA: Diagnosis not present

## 2024-05-11 DIAGNOSIS — G8929 Other chronic pain: Secondary | ICD-10-CM

## 2024-05-11 DIAGNOSIS — K219 Gastro-esophageal reflux disease without esophagitis: Secondary | ICD-10-CM | POA: Diagnosis not present

## 2024-05-11 MED ORDER — EUCRISA 2 % EX OINT: TOPICAL_OINTMENT | CUTANEOUS | 2 refills | Status: DC

## 2024-05-11 NOTE — Progress Notes (Addendum)
 Subjective:    Patient ID: Kathleen Everett, female    DOB: 15-Jun-1971, 53 y.o.   MRN: [id number]  Patient here for  Chief Complaint  Patient presents with   Pre-op Exam         HPI Here for a pre op evaluation. Planning for total hip arthroplasty scheduled for 05/13/24. Reports she is doing relatively well, other than her hip. Specifically denies any chest pain or sob. No increased cough or congestion. No abdominal pain or bowel change reported. Had pre op EKG - ok.    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   Endometriosis    Generalized anxiety disorder 05/01/2015   Has previously seen psychiatry. Having some issues with what she feels is related to stress and anxiety   GERD (gastroesophageal reflux disease)    Hemorrhoids 12/23/2020   Hidradenitis    High blood pressure    History of ovarian cyst    Hot flashes 03/12/2015   Hypercholesteremia 01/10/2014   Off lipitor. Elevated cholesterol. Labs discussed. Restart lipitor. Follow lipid panel and liver function tests.   Hyperglycemia 06/24/2019   IBS (irritable bowel syndrome)    Joint pain    Major depressive disorder 05/01/2015   Migraine headaches 01/10/2014   Has seen neurology. Topamax .   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 Level is 21.6   Past Surgical History:  Procedure Laterality Date   ABDOMINAL HYSTERECTOMY  03/27/07   laparoscopic   BREAST EXCISIONAL BIOPSY Left 2004   fibroadenoma   CESAREAN SECTION     1999/2004 twins   chiari decompression     COLONOSCOPY WITH PROPOFOL  N/A 04/28/2015   Procedure: COLONOSCOPY WITH PROPOFOL ;  Surgeon: Lamar ONEIDA Holmes, MD;  Location: [facility];  Service: Endoscopy;  Laterality: N/A;   COLONOSCOPY WITH PROPOFOL  N/A 03/19/2021   Procedure: COLONOSCOPY WITH PROPOFOL ;  Surgeon: Maryruth Ole ONEIDA, MD;  Location: [facility] ENDOSCOPY;  Service: Endoscopy;  Laterality: N/A;   COLPOSCOPY N/A 04/24/2022   Procedure: COLPOSCOPY, ABLATION OF VULVAR TISSUE;  Surgeon: Elby Webb Loges, MD;  Location: [facility] ORS;  Service: Gynecology;  Laterality: N/A;  CYSTOSCOPY     bladder ulcers   ESOPHAGOGASTRODUODENOSCOPY N/A 04/28/2015   Procedure: ESOPHAGOGASTRODUODENOSCOPY (EGD);  Surgeon: Lamar ONEIDA Holmes, MD;  Location: [facility] ENDOSCOPY;  Service: Endoscopy;  Laterality: N/A;   ESOPHAGOGASTRODUODENOSCOPY (EGD) WITH PROPOFOL  N/A 03/19/2021   Procedure: ESOPHAGOGASTRODUODENOSCOPY (EGD) WITH PROPOFOL ;  Surgeon: Maryruth Ole ONEIDA, MD;  Location: [facility] ENDOSCOPY;  Service: Endoscopy;  Laterality: N/A;   HERNIA REPAIR Right 2003   inguinal   INCISION AND DRAINAGE HIP Right 05/11/2020   Procedure: IRRIGATION AND DEBRIDEMENT HIP;  Surgeon: Kathlynn Sharper, MD;  Location: [facility] ORS;  Service: Orthopedics;   Laterality: Right;   OVARIAN CYST REMOVAL  2001   right   TEMPOROMANDIBULAR JOINT SURGERY  2000   TOTAL HIP ARTHROPLASTY Right 12/28/2019   Procedure: TOTAL HIP ARTHROPLASTY ANTERIOR APPROACH;  Surgeon: Kathlynn Sharper, MD;  Location: [facility] ORS;  Service: Orthopedics;  Laterality: Right;   TOTAL HIP ARTHROPLASTY Left 05/13/2024   Procedure: ARTHROPLASTY, HIP, TOTAL, ANTERIOR APPROACH;  Surgeon: Lorelle Hussar, MD;  Location: [facility] ORS;  Service: Orthopedics;  Laterality: Left;   TUBAL LIGATION     Bilateral   VULVA /PERINEUM BIOPSY  04/24/2022   Procedure: VULVAR BIOPSY;  Surgeon: Elby Webb Loges, MD;  Location: [facility] ORS;  Service: Gynecology;;   Family History  Problem Relation Age of Onset   Hyperlipidemia Mother    Anxiety disorder Mother    Atrial fibrillation Mother    Hypertension Mother    Memory loss Mother    Anxiety disorder Father    Stroke Father    Hyperlipidemia Father    Hypertension Father    Congestive Heart Failure Father    Depression Father    Hyperlipidemia Brother    Colon cancer Paternal Aunt    Dementia Paternal Aunt    Dementia Maternal Grandmother    Cancer Maternal Grandmother        unk primary; d. late 59s   Dementia Paternal Grandmother    ADD / ADHD Daughter    Breast cancer Neg Hx    Social History   Socioeconomic History   Marital status: Married    Spouse name: Medford   Number of children: 3   Years of education: 13   Highest education level: Some college, no degree  Occupational History   Occupation: Disability  Tobacco Use   Smoking status: Never   Smokeless tobacco: Never  Vaping Use   Vaping status: Never Used  Substance and Sexual Activity   Alcohol  use: Not Currently    Alcohol /week: 0.0 standard drinks of alcohol    Drug use: No   Sexual activity: Yes    Birth control/protection: Surgical    Comment: Hysterectomy  Other Topics Concern   Not on file  Social History Narrative   Patient drinks 2-3 cups of caffeine  daily.   Patient is right handed.       Social Hx:   Current living situation- [place]. Living with husband and 3 kids   Born and Raised- [place]raised by both parents.    Siblings- 1 brother who is 7 yrs older than her   Legal issues- none   Married for 24 yrs. Currently unemployed since 2011 when she hurt her back. HS grad and CNA and phlebotomy.    Social Drivers of Corporate investment banker Strain: Low Risk  (04/13/2024)   Received from [facility]   Overall Financial Resource Strain (CARDIA)    Difficulty of Paying Living Expenses: Not hard at all  Food Insecurity:  No Food Insecurity (05/13/2024)   Hunger Vital Sign    Worried About Running Out of Food in the Last Year: Never true    Ran Out of Food in the Last Year: Never true  Transportation Needs: No Transportation Needs (05/13/2024)   PRAPARE - Administrator, Civil Service (Medical): No    Lack of Transportation (Non-Medical): No  Physical Activity: Unknown (03/16/2024)   Exercise Vital Sign    Days of Exercise per Week: Not on file    Minutes of Exercise per Session: 0 min  Stress: No Stress Concern Present (03/16/2024)   [employer] of Occupational Health - Occupational Stress Questionnaire    Feeling of Stress : Only a little  Social Connections: Moderately Integrated (03/16/2024)   Social Connection and Isolation Panel    Frequency of Communication with Friends and Family: More than three times a week    Frequency of Social Gatherings with Friends and Family: Once a week    Attends Religious Services: More than 4 times per year    Active Member of [employer] or Organizations: No    Attends Banker Meetings: Never    Marital Status: Married     Review of Systems  Constitutional:  Negative for appetite change and unexpected weight change.  HENT:  Negative for congestion and sinus pressure.   Respiratory:  Negative for cough, chest tightness and shortness of breath.    Cardiovascular:  Negative for chest pain, palpitations and leg swelling.  Gastrointestinal:  Negative for abdominal pain, diarrhea, nausea and vomiting.  Genitourinary:  Negative for difficulty urinating and dysuria.  Musculoskeletal:        Chronic back pain. Hip pain.   Skin:  Negative for color change and rash.  Neurological:  Negative for dizziness and headaches.  Psychiatric/Behavioral:  Negative for agitation and dysphoric mood.        Objective:     BP 116/72   Pulse 71   Temp 97.9 F (36.6 C)   Resp 16   Ht 5' 10 (1.778 m)   Wt 205 lb (93 kg)   LMP 12/08/2006   SpO2 99%   BMI 29.41 kg/m  Wt Readings from Last 3 Encounters:  05/13/24 205 lb (93 kg)  05/11/24 205 lb (93 kg)  05/06/24 205 lb 8 oz (93.2 kg)    Physical Exam Vitals reviewed.  Constitutional:      General: She is not in acute distress.    Appearance: Normal appearance.  HENT:     Head: Normocephalic and atraumatic.     Right Ear: External ear normal.     Left Ear: External ear normal.     Mouth/Throat:     Pharynx: No oropharyngeal exudate or posterior oropharyngeal erythema.   Eyes:     General: No scleral icterus.       Right eye: No discharge.        Left eye: No discharge.     Conjunctiva/sclera: Conjunctivae normal.   Neck:     Thyroid : No thyromegaly.   Cardiovascular:     Rate and Rhythm: Normal rate and regular rhythm.  Pulmonary:     Effort: No respiratory distress.     Breath sounds: Normal breath sounds. No wheezing.  Abdominal:     General: Bowel sounds are normal.     Palpations: Abdomen is soft.     Tenderness: There is no abdominal tenderness.   Musculoskeletal:        General: No swelling  or tenderness.     Cervical back: Neck supple. No tenderness.  Lymphadenopathy:     Cervical: No cervical adenopathy.   Skin:    Findings: No erythema or rash.   Neurological:     Mental Status: She is alert.   Psychiatric:        Mood and Affect: Mood normal.         Behavior: Behavior normal.         Outpatient Encounter Medications as of 05/11/2024  Medication Sig   AIMOVIG 140 MG/ML SOAJ Inject 140 mg into the skin every 28 (twenty-eight) days.   atorvastatin  (LIPITOR) 20 MG tablet Take 1 tablet (20 mg total) by mouth daily.   azelastine  (ASTELIN ) 0.1 % nasal spray Place 1 spray into both nostrils 2 (two) times daily. Use in each nostril as directed (Patient taking differently: Place 1 spray into both nostrils 2 (two) times daily as needed for allergies or rhinitis. Use in each nostril as directed)   buPROPion  (WELLBUTRIN  XL) 300 MG 24 hr tablet Take 1 tablet (300 mg total) by mouth daily. 450 mg daily. Take along with 150 mg tab (Patient taking differently: Take 300 mg by mouth daily.)   cyanocobalamin  (VITAMIN B12) 1000 MCG/ML injection INJECT 1 ML INTRAMUSCULARLY  ONCE EVERY MONTH   diclofenac Sodium (VOLTAREN) 1 % GEL Apply 2 g topically daily as needed (pain).   estradiol  (ESTRACE ) 0.1 MG/GM vaginal cream Place 1 Applicatorful vaginally 4 (four) times a week. At bedtime   estradiol  (VIVELLE -DOT) 0.05 MG/24HR patch Place 1 patch (0.05 mg total) onto the skin 2 (two) times a week.   HYDROcodone -acetaminophen  (NORCO) 10-325 MG tablet Take 1 tablet by mouth in the morning, at noon, in the evening, and at bedtime.   metFORMIN  (GLUCOPHAGE ) 500 MG tablet Take 1 tablet (500 mg total) by mouth 2 (two) times daily with a meal.   methocarbamol  (ROBAXIN ) 500 MG tablet Take 500 mg by mouth 4 (four) times daily.   rizatriptan (MAXALT) 10 MG tablet Take 10 mg by mouth as needed for migraine. May repeat in 2 hours if needed   sertraline  (ZOLOFT ) 100 MG tablet Take 1.5 tablets (150 mg total) by mouth at bedtime.   topiramate  (TOPAMAX ) 25 MG tablet Take 50 mg by mouth 2 (two) times daily.   Ubrogepant (UBRELVY) 100 MG TABS Take 100 mg by mouth 2 (two) times daily as needed (migraine).   XTAMPZA  ER 13.5 MG C12A Take 13.5 mg by mouth 2 (two) times daily.    [DISCONTINUED] acetaminophen  (TYLENOL ) 325 MG tablet Take 650 mg by mouth every 6 (six) hours as needed for moderate pain (pain score 4-6).   [DISCONTINUED] Crisaborole  (EUCRISA ) 2 % OINT Apply twice a day to hands prn flares   [DISCONTINUED] esomeprazole  (NEXIUM ) 40 MG capsule Take 1 capsule (40 mg total) by mouth 2 (two) times daily before a meal.   [DISCONTINUED] ibuprofen  (ADVIL ) 800 MG tablet Take 800 mg by mouth every 8 (eight) hours as needed for moderate pain (pain score 4-6).   No facility-administered encounter medications on file as of 05/11/2024.     Lab Results  Component Value Date   WBC 8.8 05/14/2024   HGB 10.2 (L) 05/14/2024   HCT 29.9 (L) 05/14/2024   PLT 143 (L) 05/14/2024   GLUCOSE 109 (H) 05/14/2024   CHOL 183 02/17/2024   TRIG 72 02/17/2024   HDL 57 02/17/2024   LDLDIRECT 181.0 06/26/2020   LDLCALC 113 (H) 02/17/2024   ALT 14  04/30/2024   AST 15 04/30/2024   NA 141 05/14/2024   K 3.8 05/14/2024   CL 110 05/14/2024   CREATININE 0.60 05/14/2024   BUN 13 05/14/2024   CO2 27 05/14/2024   TSH 2.630 07/14/2023   INR 1.1 12/03/2019   HGBA1C 5.0 02/17/2024       Assessment & Plan:  Anemia, unspecified type Assessment & Plan: Recent hgb 13.2. follow.    Arnold-Chiari malformation [facility]) Assessment & Plan: S/p surgery. Followed by neurology. Stable.    Chronic low back pain, unspecified back pain laterality, unspecified whether sciatica present Assessment & Plan: Followed by pain clinic. S/p removal of spinal cord stimulator. Stable.    Depression, major, in remission [facility]) Assessment & Plan: Followed by psychiatry. Continues on zoloft  and wellbutrin . Stable.    Gastroesophageal reflux disease, unspecified whether esophagitis present Assessment & Plan: Continue nexium .    Hyperglycemia Assessment & Plan: Low carb diet. Follow met b and A1c.    Pre-op evaluation Assessment & Plan: Here for pre op evaluation. Planning for hip surgery. Had pre  op EKG - SR with no acute ischemic changes. No acute symptoms. No chest pain or sob. No cough or congestion. Low risk to proceed with planned surgery. Form completed. She is not taking metformin . Surgery has discussed medications to stop.       Allena Hamilton, MD

## 2024-05-11 NOTE — Progress Notes (Signed)
 Pt requesting refills of Eucrisa  ointment. Sent to [facility] [address].

## 2024-05-12 ENCOUNTER — Encounter: Payer: Self-pay | Admitting: Nurse Practitioner

## 2024-05-12 ENCOUNTER — Telehealth: Payer: Self-pay

## 2024-05-12 DIAGNOSIS — J069 Acute upper respiratory infection, unspecified: Secondary | ICD-10-CM | POA: Insufficient documentation

## 2024-05-12 NOTE — Assessment & Plan Note (Signed)
 Symptoms indicate a viral infection or allergies. Do not feel antibiotics are warranted at this time. Steroids are contraindicated due to upcoming surgery. Recommend OTC antihistamine once daily. Continue azelastine  nasal spray or switch to fluticasone, two sprays each nostril in the morning. Consider nasal saline rinses for congestion. Use acetaminophen  or ibuprofen  for pain relief. Throat lozenges or cough drops can alleviate throat discomfort. Report any high fever, severe headaches, or neck pain. Upper respiratory symptoms are not expected to interfere with the surgery. Spinal anesthesia is planned. Monitor symptoms over the weekend and report if not improved by Monday. Communicate with the surgical team if symptoms persist or worsen.

## 2024-05-12 NOTE — Telephone Encounter (Signed)
 Copied from CRM [id number]. Topic: General - Other >> May 12, 2024  4:46 PM Sophia H wrote: Reason for CRM: [facility] -  regarding surgical clearance for surgery tomorrow- [phone]  Stated Dr. Glendia said she sent it earlier but do not see anything in chart. Please reach out to Joy and let her know ASAP. TY!

## 2024-05-13 ENCOUNTER — Encounter: Payer: Self-pay | Admitting: Orthopedic Surgery

## 2024-05-13 ENCOUNTER — Ambulatory Visit: Payer: Self-pay | Admitting: Urgent Care

## 2024-05-13 ENCOUNTER — Other Ambulatory Visit: Payer: Self-pay | Admitting: Internal Medicine

## 2024-05-13 ENCOUNTER — Ambulatory Visit

## 2024-05-13 ENCOUNTER — Other Ambulatory Visit: Payer: Self-pay

## 2024-05-13 ENCOUNTER — Encounter
Admission: RE | Disposition: A | Discharge status: Home Health | Payer: Self-pay | Source: Home / Self Care | Attending: Orthopedic Surgery

## 2024-05-13 ENCOUNTER — Encounter: Payer: Self-pay | Admitting: Internal Medicine

## 2024-05-13 ENCOUNTER — Ambulatory Visit
Admission: RE | Admit: 2024-05-13 | Discharge: 2024-05-14 | Disposition: A | Discharge status: Home Health | Attending: Orthopedic Surgery | Admitting: Orthopedic Surgery

## 2024-05-13 DIAGNOSIS — S73192A Other sprain of left hip, initial encounter: Secondary | ICD-10-CM | POA: Diagnosis not present

## 2024-05-13 DIAGNOSIS — X58XXXA Exposure to other specified factors, initial encounter: Secondary | ICD-10-CM | POA: Insufficient documentation

## 2024-05-13 DIAGNOSIS — G8929 Other chronic pain: Secondary | ICD-10-CM | POA: Insufficient documentation

## 2024-05-13 DIAGNOSIS — G4730 Sleep apnea, unspecified: Secondary | ICD-10-CM | POA: Insufficient documentation

## 2024-05-13 DIAGNOSIS — M1612 Unilateral primary osteoarthritis, left hip: Secondary | ICD-10-CM | POA: Insufficient documentation

## 2024-05-13 DIAGNOSIS — F419 Anxiety disorder, unspecified: Secondary | ICD-10-CM | POA: Diagnosis not present

## 2024-05-13 DIAGNOSIS — Z87728 Personal history of other specified (corrected) congenital malformations of nervous system and sense organs: Secondary | ICD-10-CM | POA: Diagnosis not present

## 2024-05-13 DIAGNOSIS — F32A Depression, unspecified: Secondary | ICD-10-CM | POA: Insufficient documentation

## 2024-05-13 DIAGNOSIS — I341 Nonrheumatic mitral (valve) prolapse: Secondary | ICD-10-CM | POA: Insufficient documentation

## 2024-05-13 DIAGNOSIS — K219 Gastro-esophageal reflux disease without esophagitis: Secondary | ICD-10-CM | POA: Insufficient documentation

## 2024-05-13 DIAGNOSIS — I10 Essential (primary) hypertension: Secondary | ICD-10-CM | POA: Insufficient documentation

## 2024-05-13 DIAGNOSIS — Z96642 Presence of left artificial hip joint: Secondary | ICD-10-CM | POA: Diagnosis present

## 2024-05-13 HISTORY — PX: TOTAL HIP ARTHROPLASTY: SHX124

## 2024-05-13 SURGERY — ARTHROPLASTY, HIP, TOTAL, ANTERIOR APPROACH
Anesthesia: General | Site: Hip | Laterality: Left

## 2024-05-13 MED ORDER — ONDANSETRON HCL 4 MG PO TABS: 4.0000 mg | ORAL_TABLET | Freq: Four times a day (QID) | ORAL | Status: DC | PRN

## 2024-05-13 MED ORDER — CEFAZOLIN SODIUM-DEXTROSE 2-4 GM/100ML-% IV SOLN
2.0000 g | Freq: Four times a day (QID) | INTRAVENOUS | Status: AC
  Administered 2024-05-13 (×2): 2 g via INTRAVENOUS
  Filled 2024-05-13: qty 100

## 2024-05-13 MED ORDER — ONDANSETRON HCL 4 MG/2ML IJ SOLN: 4.0000 mg | Freq: Four times a day (QID) | INTRAMUSCULAR | Status: DC | PRN

## 2024-05-13 MED ORDER — DOCUSATE SODIUM 100 MG PO CAPS
100.0000 mg | ORAL_CAPSULE | Freq: Two times a day (BID) | ORAL | Status: DC
Start: 2024-05-13 — End: 2024-05-14
  Administered 2024-05-13 – 2024-05-14 (×2): 100 mg via ORAL
  Filled 2024-05-13: qty 1

## 2024-05-13 MED ORDER — EPHEDRINE SULFATE-NACL 50-0.9 MG/10ML-% IV SOSY
PREFILLED_SYRINGE | INTRAVENOUS | Status: DC | PRN
  Administered 2024-05-13 (×4): 5 mg via INTRAVENOUS

## 2024-05-13 MED ORDER — METOCLOPRAMIDE HCL 10 MG PO TABS: 5.0000 mg | ORAL_TABLET | Freq: Three times a day (TID) | ORAL | Status: DC | PRN

## 2024-05-13 MED ORDER — ACETAMINOPHEN 10 MG/ML IV SOLN
INTRAVENOUS | Status: AC
  Filled 2024-05-13: qty 100

## 2024-05-13 MED ORDER — PHENOL 1.4 % MT LIQD: 1.0000 | OROMUCOSAL | Status: DC | PRN

## 2024-05-13 MED ORDER — FENTANYL CITRATE (PF) 100 MCG/2ML IJ SOLN
INTRAMUSCULAR | Status: AC
  Filled 2024-05-13: qty 2

## 2024-05-13 MED ORDER — OXYCODONE HCL 5 MG PO TABS
5.0000 mg | ORAL_TABLET | ORAL | Status: DC | PRN
  Administered 2024-05-14: 5 mg via ORAL

## 2024-05-13 MED ORDER — BUPROPION HCL ER (XL) 150 MG PO TB24
300.0000 mg | ORAL_TABLET | Freq: Every day | ORAL | Status: DC
  Administered 2024-05-14: 300 mg via ORAL
  Filled 2024-05-13: qty 2

## 2024-05-13 MED ORDER — KETOROLAC TROMETHAMINE 15 MG/ML IJ SOLN
7.5000 mg | Freq: Four times a day (QID) | INTRAMUSCULAR | Status: AC
  Administered 2024-05-13 – 2024-05-14 (×4): 7.5 mg via INTRAVENOUS
  Filled 2024-05-13 (×3): qty 1

## 2024-05-13 MED ORDER — METHOCARBAMOL 500 MG PO TABS
500.0000 mg | ORAL_TABLET | Freq: Once | ORAL | Status: AC
  Administered 2024-05-13: 500 mg via ORAL

## 2024-05-13 MED ORDER — SODIUM CHLORIDE (PF) 0.9 % IJ SOLN
INTRAMUSCULAR | Status: DC | PRN
  Administered 2024-05-13: 50 mL via INTRAMUSCULAR

## 2024-05-13 MED ORDER — PHENYLEPHRINE 80 MCG/ML (10ML) SYRINGE FOR IV PUSH (FOR BLOOD PRESSURE SUPPORT)
PREFILLED_SYRINGE | INTRAVENOUS | Status: DC | PRN
  Administered 2024-05-13: 160 ug via INTRAVENOUS

## 2024-05-13 MED ORDER — SCOPOLAMINE 1 MG/3DAYS TD PT72
MEDICATED_PATCH | TRANSDERMAL | Status: AC
  Filled 2024-05-13: qty 1

## 2024-05-13 MED ORDER — OXYCODONE HCL 5 MG PO TABS
ORAL_TABLET | ORAL | Status: AC
  Filled 2024-05-13: qty 1

## 2024-05-13 MED ORDER — FENTANYL CITRATE (PF) 100 MCG/2ML IJ SOLN
25.0000 ug | INTRAMUSCULAR | Status: DC | PRN
  Administered 2024-05-13 (×3): 50 ug via INTRAVENOUS

## 2024-05-13 MED ORDER — OXYCODONE HCL 5 MG PO TABS
ORAL_TABLET | ORAL | Status: AC
  Filled 2024-05-13: qty 2

## 2024-05-13 MED ORDER — TOPIRAMATE 25 MG PO TABS
50.0000 mg | ORAL_TABLET | Freq: Two times a day (BID) | ORAL | Status: DC
Start: 2024-05-13 — End: 2024-05-14
  Administered 2024-05-13 – 2024-05-14 (×2): 50 mg via ORAL
  Filled 2024-05-13: qty 2
  Filled 2024-05-13: qty 0.5

## 2024-05-13 MED ORDER — HYDROMORPHONE HCL 1 MG/ML IJ SOLN
INTRAMUSCULAR | Status: AC
  Filled 2024-05-13: qty 1

## 2024-05-13 MED ORDER — OXYCODONE HCL 5 MG PO TABS
10.0000 mg | ORAL_TABLET | ORAL | Status: DC | PRN
  Administered 2024-05-13 – 2024-05-14 (×5): 10 mg via ORAL
  Filled 2024-05-13 (×4): qty 2

## 2024-05-13 MED ORDER — PROPOFOL 10 MG/ML IV BOLUS
INTRAVENOUS | Status: AC
  Filled 2024-05-13: qty 20

## 2024-05-13 MED ORDER — OXYCODONE HCL 5 MG/5ML PO SOLN: 5.0000 mg | Freq: Once | ORAL | Status: AC | PRN

## 2024-05-13 MED ORDER — LACTATED RINGERS IV SOLN: INTRAVENOUS | Status: DC

## 2024-05-13 MED ORDER — PANTOPRAZOLE SODIUM 40 MG PO TBEC
DELAYED_RELEASE_TABLET | ORAL | Status: AC
  Filled 2024-05-13: qty 1

## 2024-05-13 MED ORDER — ACETAMINOPHEN 10 MG/ML IV SOLN
1000.0000 mg | Freq: Once | INTRAVENOUS | Status: DC | PRN
  Administered 2024-05-13: 1000 mg via INTRAVENOUS

## 2024-05-13 MED ORDER — INSULIN ASPART 100 UNIT/ML IJ SOLN: 0.0000 [IU] | Freq: Three times a day (TID) | INTRAMUSCULAR | Status: DC

## 2024-05-13 MED ORDER — ROCURONIUM BROMIDE 100 MG/10ML IV SOLN
INTRAVENOUS | Status: DC | PRN
  Administered 2024-05-13: 50 mg via INTRAVENOUS

## 2024-05-13 MED ORDER — METHOCARBAMOL 500 MG PO TABS
500.0000 mg | ORAL_TABLET | Freq: Four times a day (QID) | ORAL | Status: DC | PRN
  Administered 2024-05-13: 500 mg via ORAL
  Filled 2024-05-13: qty 1

## 2024-05-13 MED ORDER — HYDROMORPHONE HCL 1 MG/ML IJ SOLN
INTRAMUSCULAR | Status: DC | PRN
  Administered 2024-05-13 (×2): .5 mg via INTRAVENOUS

## 2024-05-13 MED ORDER — MIDAZOLAM HCL 2 MG/2ML IJ SOLN
INTRAMUSCULAR | Status: DC | PRN
  Administered 2024-05-13: 5 mg via INTRAVENOUS
  Administered 2024-05-13: 2 mg via INTRAVENOUS

## 2024-05-13 MED ORDER — LIDOCAINE HCL (CARDIAC) PF 100 MG/5ML IV SOSY
PREFILLED_SYRINGE | INTRAVENOUS | Status: DC | PRN
  Administered 2024-05-13: 100 mg via INTRAVENOUS

## 2024-05-13 MED ORDER — LEVOFLOXACIN IN D5W 500 MG/100ML IV SOLN
500.0000 mg | INTRAVENOUS | Status: AC
  Administered 2024-05-13: 500 mg via INTRAVENOUS

## 2024-05-13 MED ORDER — METOCLOPRAMIDE HCL 5 MG/ML IJ SOLN: 5.0000 mg | Freq: Three times a day (TID) | INTRAMUSCULAR | Status: DC | PRN

## 2024-05-13 MED ORDER — SODIUM CHLORIDE 0.9 % IV SOLN: INTRAVENOUS | Status: DC

## 2024-05-13 MED ORDER — LEVOFLOXACIN IN D5W 500 MG/100ML IV SOLN
INTRAVENOUS | Status: AC
  Filled 2024-05-13: qty 100

## 2024-05-13 MED ORDER — METHOCARBAMOL 500 MG PO TABS
ORAL_TABLET | ORAL | Status: AC
  Filled 2024-05-13: qty 1

## 2024-05-13 MED ORDER — TRANEXAMIC ACID-NACL 1000-0.7 MG/100ML-% IV SOLN
INTRAVENOUS | Status: AC
  Filled 2024-05-13: qty 100

## 2024-05-13 MED ORDER — KETOROLAC TROMETHAMINE 15 MG/ML IJ SOLN
INTRAMUSCULAR | Status: AC
  Filled 2024-05-13: qty 1

## 2024-05-13 MED ORDER — ONDANSETRON HCL 4 MG/2ML IJ SOLN
INTRAMUSCULAR | Status: DC | PRN
  Administered 2024-05-13: 4 mg via INTRAVENOUS

## 2024-05-13 MED ORDER — 0.9 % SODIUM CHLORIDE (POUR BTL) OPTIME
TOPICAL | Status: DC | PRN
  Administered 2024-05-13 (×2): 500 mL

## 2024-05-13 MED ORDER — SERTRALINE HCL 50 MG PO TABS
150.0000 mg | ORAL_TABLET | Freq: Every day | ORAL | Status: DC
  Administered 2024-05-13: 150 mg via ORAL
  Filled 2024-05-13: qty 3

## 2024-05-13 MED ORDER — CHLORHEXIDINE GLUCONATE 0.12 % MT SOLN
15.0000 mL | Freq: Once | OROMUCOSAL | Status: AC
  Administered 2024-05-13: 15 mL via OROMUCOSAL

## 2024-05-13 MED ORDER — HYDROMORPHONE HCL 1 MG/ML IJ SOLN
0.5000 mg | INTRAMUSCULAR | Status: AC | PRN
  Administered 2024-05-13 (×4): 0.5 mg via INTRAVENOUS

## 2024-05-13 MED ORDER — INSULIN ASPART 100 UNIT/ML IJ SOLN: 0.0000 [IU] | Freq: Every day | INTRAMUSCULAR | Status: DC

## 2024-05-13 MED ORDER — CEFAZOLIN SODIUM-DEXTROSE 2-4 GM/100ML-% IV SOLN
INTRAVENOUS | Status: AC
  Filled 2024-05-13: qty 100

## 2024-05-13 MED ORDER — MENTHOL 3 MG MT LOZG: 1.0000 | LOZENGE | OROMUCOSAL | Status: DC | PRN

## 2024-05-13 MED ORDER — OXYCODONE HCL 5 MG PO TABS
5.0000 mg | ORAL_TABLET | Freq: Once | ORAL | Status: AC | PRN
  Administered 2024-05-13: 5 mg via ORAL

## 2024-05-13 MED ORDER — HYDROMORPHONE HCL 1 MG/ML IJ SOLN
0.5000 mg | INTRAMUSCULAR | Status: DC | PRN
  Administered 2024-05-13: 0.5 mg via INTRAVENOUS
  Filled 2024-05-13: qty 0.5

## 2024-05-13 MED ORDER — SODIUM CHLORIDE 0.9 % IR SOLN
Status: DC | PRN
  Administered 2024-05-13: 100 mL

## 2024-05-13 MED ORDER — ONDANSETRON HCL 4 MG/2ML IJ SOLN: 4.0000 mg | Freq: Once | INTRAMUSCULAR | Status: DC | PRN

## 2024-05-13 MED ORDER — FENTANYL CITRATE (PF) 100 MCG/2ML IJ SOLN
INTRAMUSCULAR | Status: DC | PRN
  Administered 2024-05-13 (×2): 50 ug via INTRAVENOUS

## 2024-05-13 MED ORDER — TRANEXAMIC ACID-NACL 1000-0.7 MG/100ML-% IV SOLN
1000.0000 mg | INTRAVENOUS | Status: AC
  Administered 2024-05-13 (×2): 1000 mg via INTRAVENOUS

## 2024-05-13 MED ORDER — ACETAMINOPHEN 325 MG PO TABS: 325.0000 mg | ORAL_TABLET | Freq: Four times a day (QID) | ORAL | Status: DC | PRN

## 2024-05-13 MED ORDER — MIDAZOLAM HCL 2 MG/2ML IJ SOLN
INTRAMUSCULAR | Status: AC
  Filled 2024-05-13: qty 2

## 2024-05-13 MED ORDER — ATORVASTATIN CALCIUM 10 MG PO TABS
20.0000 mg | ORAL_TABLET | Freq: Every day | ORAL | Status: DC
  Administered 2024-05-14: 20 mg via ORAL

## 2024-05-13 MED ORDER — SUGAMMADEX SODIUM 200 MG/2ML IV SOLN
INTRAVENOUS | Status: DC | PRN
  Administered 2024-05-13: 200 mg via INTRAVENOUS

## 2024-05-13 MED ORDER — PANTOPRAZOLE SODIUM 40 MG PO TBEC
40.0000 mg | DELAYED_RELEASE_TABLET | Freq: Every day | ORAL | Status: DC
  Administered 2024-05-13 – 2024-05-14 (×2): 40 mg via ORAL

## 2024-05-13 MED ORDER — CEFAZOLIN SODIUM-DEXTROSE 2-3 GM-%(50ML) IV SOLR
INTRAVENOUS | Status: DC | PRN
  Administered 2024-05-13: 2 g via INTRAVENOUS

## 2024-05-13 MED ORDER — SURGIPHOR WOUND IRRIGATION SYSTEM - OPTIME
TOPICAL | Status: DC | PRN
  Administered 2024-05-13: 450 mL

## 2024-05-13 MED ORDER — ENOXAPARIN SODIUM 40 MG/0.4ML IJ SOSY
40.0000 mg | PREFILLED_SYRINGE | INTRAMUSCULAR | Status: DC
  Administered 2024-05-14: 40 mg via SUBCUTANEOUS
  Filled 2024-05-13: qty 0.4

## 2024-05-13 MED ORDER — SCOPOLAMINE 1 MG/3DAYS TD PT72
1.0000 | MEDICATED_PATCH | TRANSDERMAL | Status: DC
  Administered 2024-05-13: 1.5 mg via TRANSDERMAL

## 2024-05-13 MED ORDER — CHLORHEXIDINE GLUCONATE 0.12 % MT SOLN
OROMUCOSAL | Status: AC
  Filled 2024-05-13: qty 15

## 2024-05-13 MED ORDER — PHENYLEPHRINE HCL-NACL 20-0.9 MG/250ML-% IV SOLN
INTRAVENOUS | Status: DC | PRN
  Administered 2024-05-13: 20 ug/min via INTRAVENOUS

## 2024-05-13 MED ORDER — DEXAMETHASONE SODIUM PHOSPHATE 10 MG/ML IJ SOLN
8.0000 mg | Freq: Once | INTRAMUSCULAR | Status: AC
  Administered 2024-05-13: 8 mg via INTRAVENOUS

## 2024-05-13 MED ORDER — PROPOFOL 10 MG/ML IV BOLUS
INTRAVENOUS | Status: DC | PRN
  Administered 2024-05-13: 200 mg via INTRAVENOUS

## 2024-05-13 MED ORDER — ACETAMINOPHEN 500 MG PO TABS
1000.0000 mg | ORAL_TABLET | Freq: Three times a day (TID) | ORAL | Status: AC
  Administered 2024-05-13 – 2024-05-14 (×3): 1000 mg via ORAL
  Filled 2024-05-13 (×4): qty 2

## 2024-05-13 MED ORDER — ORAL CARE MOUTH RINSE: 15.0000 mL | Freq: Once | OROMUCOSAL | Status: AC

## 2024-05-13 SURGICAL SUPPLY — 58 items
BLADE CLIPPER SURG (BLADE) IMPLANT
BLADE SAGITTAL AGGR TOOTH XLG (BLADE) ×2 IMPLANT
BNDG COHESIVE 6X5 TAN ST LF (GAUZE/BANDAGES/DRESSINGS) ×4 IMPLANT
BRUSH SCRUB EZ PLAIN DRY (MISCELLANEOUS) ×2 IMPLANT
CHLORAPREP W/TINT 26 (MISCELLANEOUS) ×2 IMPLANT
DERMABOND ADVANCED .7 DNX12 (GAUZE/BANDAGES/DRESSINGS) ×2 IMPLANT
DRAPE C-ARM XRAY 36X54 (DRAPES) ×2 IMPLANT
DRAPE SHEET LG 3/4 BI-LAMINATE (DRAPES) ×4 IMPLANT
DRAPE TABLE BACK 80X90 (DRAPES) ×2 IMPLANT
DRSG MEPILEX SACRM 8.7X9.8 (GAUZE/BANDAGES/DRESSINGS) ×2 IMPLANT
DRSG OPSITE POSTOP 4X8 (GAUZE/BANDAGES/DRESSINGS) ×2 IMPLANT
ELECTRODE BLDE 4.0 EZ CLN MEGD (MISCELLANEOUS) ×2 IMPLANT
ELECTRODE REM PT RTRN 9FT ADLT (ELECTROSURGICAL) ×2 IMPLANT
GLOVE BIO SURGEON STRL SZ8 (GLOVE) ×2 IMPLANT
GLOVE BIOGEL PI IND STRL 8 (GLOVE) ×2 IMPLANT
GLOVE PI ORTHO PRO STRL 7.5 (GLOVE) ×4 IMPLANT
GLOVE PI ORTHO PRO STRL SZ8 (GLOVE) ×4 IMPLANT
GLOVE SURG SYN 7.5 E (GLOVE) ×2 IMPLANT
GLOVE SURG SYN 7.5 PF PI (GLOVE) ×2 IMPLANT
GOWN SRG XL LVL 3 NONREINFORCE (GOWNS) ×2 IMPLANT
GOWN STRL REUS W/ TWL LRG LVL3 (GOWN DISPOSABLE) ×2 IMPLANT
GOWN STRL REUS W/ TWL XL LVL3 (GOWN DISPOSABLE) ×2 IMPLANT
HEAD BIOLOX HIP 36/-2.5 (Joint) IMPLANT
HOOD PEEL AWAY T7 (MISCELLANEOUS) ×4 IMPLANT
INSERT TRIDENT POLY 36 0DEG (Insert) IMPLANT
IV NS 100ML SINGLE PACK (IV SOLUTION) ×2 IMPLANT
KIT PATIENT CARE HANA TABLE (KITS) ×2 IMPLANT
KIT TURNOVER CYSTO (KITS) ×2 IMPLANT
LIGHT WAVEGUIDE WIDE FLAT (MISCELLANEOUS) ×2 IMPLANT
MANIFOLD NEPTUNE II (INSTRUMENTS) ×2 IMPLANT
MARKER SKIN DUAL TIP RULER LAB (MISCELLANEOUS) ×2 IMPLANT
MAT ABSORB FLUID 56X50 GRAY (MISCELLANEOUS) ×2 IMPLANT
NDL SPNL 20GX3.5 QUINCKE YW (NEEDLE) ×2 IMPLANT
NEEDLE SPNL 20GX3.5 QUINCKE YW (NEEDLE) ×1 IMPLANT
NS IRRIG 500ML POUR BTL (IV SOLUTION) ×2 IMPLANT
PACK HIP COMPR (MISCELLANEOUS) ×2 IMPLANT
PAD ARMBOARD POSITIONER FOAM (MISCELLANEOUS) ×2 IMPLANT
PENCIL SMOKE EVACUATOR (MISCELLANEOUS) ×2 IMPLANT
SCREW HEX LP 6.5X15 (Screw) IMPLANT
SCREW HEX LP 6.5X30 (Screw) IMPLANT
SHELL CLUSTERHOLE ACETABULAR 5 (Shell) IMPLANT
SLEEVE SCD COMPRESS KNEE MED (STOCKING) ×2 IMPLANT
SOLUTION IRRIG SURGIPHOR (IV SOLUTION) ×2 IMPLANT
STEM STD OFFSET SZ3 32.5 (Stem) IMPLANT
SURGIFLO W/THROMBIN 8M KIT (HEMOSTASIS) IMPLANT
SUT BONE WAX W31G (SUTURE) ×2 IMPLANT
SUT ETHIBOND 2 V 37 (SUTURE) ×2 IMPLANT
SUT SILK 0 30XBRD TIE 6 (SUTURE) ×2 IMPLANT
SUT STRATAFIX 14 PDO 36 VLT (SUTURE) ×2 IMPLANT
SUT VIC AB 0 CT1 36 (SUTURE) ×2 IMPLANT
SUT VIC AB 2-0 CT2 27 (SUTURE) ×2 IMPLANT
SUTURE STRATA SPIR 4-0 18 (SUTURE) ×2 IMPLANT
SYR 20ML LL LF (SYRINGE) ×4 IMPLANT
TAPE MICROFOAM 4IN (TAPE) IMPLANT
TOWEL OR 17X26 4PK STRL BLUE (TOWEL DISPOSABLE) IMPLANT
TRAP FLUID SMOKE EVACUATOR (MISCELLANEOUS) ×2 IMPLANT
WAND WEREWOLF FASTSEAL 6.0 (MISCELLANEOUS) ×2 IMPLANT
WATER STERILE IRR 1000ML POUR (IV SOLUTION) ×2 IMPLANT

## 2024-05-13 NOTE — Telephone Encounter (Unsigned)
 Copied from CRM [id number]. Topic: General - Other >> May 12, 2024  4:01 PM Melissa C wrote: Reason for CRM: Joy from from Orthopedics office calling for surgical clearance form and EKG that was supposed to be sent over. She stated they did not receive it. Phone number [phone] fax number [phone]

## 2024-05-13 NOTE — Op Note (Signed)
 Patient Name: Kataya Guimont  FMW:[id number]  Pre-Operative Diagnosis: Left hip Osteoarthritis with tension sided femoral neck stress fracture  Post-Operative Diagnosis: (same)  Procedure: Left Total Hip Arthroplasty  Components/Implants: Cup: Trident Tritanium Clusterhold 52/E w/x2 screws    Liner: Neutral X3 poly 36/E  Stem: Insignia #3 std offset  Head:Biolox ceramic 36 -2.66mm   Date of Surgery: 05/13/2024  Surgeon: Arthea Sheer MD  Assistant: Debby Amber PA (present and scrubbed throughout the case, critical for assistance with exposure, retraction, instrumentation, and closure), Mliss PAs   Anesthesiologist: Mazzoni  Anesthesia: General   EBL: 200 cc  IVF:500cc  Complications: None   Brief history: The patient is a 53 year old female with a history of osteoarthritis of the left hip with pain limiting their range of motion and activities of daily living, which has failed multiple attempts at conservative therapy.  The risks and benefits of total hip arthroplasty as definitive surgical treatment were discussed with the patient, who opted to proceed with the operation.  After outpatient medical clearance and optimization was completed the patient was admitted to [facility] for the procedure.  All preoperative films were reviewed and an appropriate surgical plan was made prior to surgery.   Description of procedure: The patient was brought to the operating room where laterality was confirmed by all those present to be the left side.  The patient was administered general anesthesia on a stretcher prior to being moved supine on the operating room table. Patient was given an intravenous dose of antibiotics for surgical prophylaxis and TXA.  All bony prominences and extremities were well padded and the patient was securely attached to the table boots, a perineal post was placed and the patient had a safety strap placed.  Surgical site was prepped with alcohol   and chlorhexidine . The surgical site over the hip was and draped in typical sterile fashion with multiple layers of adhesive and nonadhesive drapes.  The incision site was marked out with a sterile marker and care was taken to assess the position of the ASIS and ensure appropriate position for the incision.    A surgical timeout was then called with participation of all staff in the room the patient was then a confirmed again and laterality confirmed.  Incision was made over the anterior lateral aspect of the proximal thigh in line with the TFL.  Appropriate retractors were placed and all bleeding vessels were coagulated within the subcutaneous and fatty layers.  An incision was made in the TFL fascia in the interval was carefully identified.  The lateral ascending branches of the circumflex vessels were identified, cauterized and carefully dissected. The main vessels were then tied with a 0 silk hand tie.  Retractors were placed around the superior lateral and inferior medial aspects of the femoral neck and a capsulotomy was performed exposing the hip joint.  Retraction stitches were placed and the capsulotomy to assist with visualization.  Femoral neck cut was then made and the femoral head was extracted after placing the leg in traction.  Bone wax was then applied to the proximal cut surface of the femur and water cooled bipolar electrocautery was used to address any bleeding around the femoral neck cut.  Retractors were then placed around the acetabulum to fully visualize the joint space, and the remaining labral tissue was removed and pulvinar was removed.   The acetabulum was then sequentially reamed up to the appropriate size in order to get good fit and fill for the acetabular component while  under fluoroscopic guidance.  Acetabular component was then placed and malleted into a secure fit while confirming position and abduction angle and anteversion utilizing fluoroscopy.  2 screws were then placed in  the acetabular cup to assist in securing the cup in place. The cup was irrigated,  a real neutral liner was placed, impacted, and checked for stability. The femur traction was dropped and sequentially externally rotated while performing a release of the posterior and superomedial tissues off of the proximal femur to allow for mobility, care was taken to preserve the external rotators and piriformis attachments.  The remaining interval between the abductors and the capsule was dissected out and a retractor was placed over the superolateral aspect of the femur over the greater trochanter.  The leg was carefully brought down into extension and adducted to provide visualization of the proximal femur for broaching.  The femur was then sequentially broached up to an appropriate size which provided for good fill and stability to the femoral broach.  A trial neck and head were placed on the femoral broach and the leg was brought up for reduction.  The hip was reduced and manual check of stability was performed.  The hip was found to be stable in flexion internal rotation and extension external rotation.  Leg lengths were confirmed on fluoroscopy.   The hip was then dislocated the trial neck and head were removed.  The leg was then brought down into extension and adduction in the proximal femur was reexposed.  The broach trial was removed and the femur was irrigated with normal saline prior to the real femoral stem being implanted.  After the femoral stem was seated and shown to have good fit and fill the appropriate head was impacted the leg was brought up and reduced.  There was good range of motion with stability in flexion internal rotation and extension external rotation on testing.  Leg lengths were found to be appropriate on fluoroscopic evaluation at this time.  The hip was then irrigated with betdine based surgiphor solution and then saline solution.  The capsulotomy was repaired with Ethibond sutures.  A  pericapsular and peritrochanteric cocktail with Exparel  and bupivacaine  was then injected as well as the subcutaneous tissues. The fascia was closed with a #1 barbed running suture.  The deep tissues were closed with Vicryl sutures the subcutaneous tissues were closed with interrupted Vicryl sutures and a running barbed 4-0 suture.  The skin was then reinforced with Dermabond and a sterile dressing was placed.   The patient was awoken from anesthesia transferred off of the operating room table onto a hospital bed where examination of leg lengths found the leg lengths to be equal with a good distal pulse.  The patient was then transferred to the PACU in stable condition.

## 2024-05-13 NOTE — Anesthesia Postprocedure Evaluation (Signed)
 Anesthesia Post Note  Patient: Meaghann Choo Gloster  Procedure(s) Performed: ARTHROPLASTY, HIP, TOTAL, ANTERIOR APPROACH (Left: Hip)  Patient location during evaluation: PACU Anesthesia Type: General Level of consciousness: awake and alert, oriented and patient cooperative Pain management: pain level controlled Vital Signs Assessment: post-procedure vital signs reviewed and stable Respiratory status: spontaneous breathing, nonlabored ventilation and respiratory function stable Cardiovascular status: blood pressure returned to baseline and stable Postop Assessment: adequate PO intake Anesthetic complications: no   No notable events documented.   Last Vitals:  Vitals:   05/13/24 1230 05/13/24 1245  BP: 123/85 127/76  Pulse: 86 80  Resp: 11 13  Temp:  36.4 C  SpO2: 100% 100%    Last Pain:  Vitals:   05/13/24 1230  TempSrc:   PainSc: 10-Worst pain ever                 Lawana Hartzell

## 2024-05-13 NOTE — Anesthesia Procedure Notes (Cosign Needed)
 Procedure Name: Intubation Date/Time: 05/13/2024 10:15 AM  Performed by: Shellie Odor, MDPre-anesthesia Checklist: Patient identified, Emergency Drugs available, Suction available and Patient being monitored Patient Re-evaluated:Patient Re-evaluated prior to induction Oxygen Delivery Method: Circle system utilized Preoxygenation: Pre-oxygenation with 100% oxygen Induction Type: IV induction Ventilation: Mask ventilation without difficulty Laryngoscope Size: McGrath and 3 Grade View: Grade I Tube type: Oral Tube size: 7.5 mm Number of attempts: 1 Airway Equipment and Method: Stylet and Oral airway Placement Confirmation: ETT inserted through vocal cords under direct vision, positive ETCO2 and breath sounds checked- equal and bilateral Tube secured with: Tape Dental Injury: Teeth and Oropharynx as per pre-operative assessment

## 2024-05-13 NOTE — Telephone Encounter (Signed)
 Pre op has been received per Dr Glendia

## 2024-05-13 NOTE — Interval H&P Note (Signed)
Patient history and physical updated. Consent reviewed including risks, benefits, and alternatives to surgery. Patient agrees with above plan to proceed with left anterior total hip arthroplasty  

## 2024-05-13 NOTE — Progress Notes (Signed)
 Pt has sliding scale insulin  and CBG check. Per pt she does not have diabetes. MD Aberman notified.   Orders received to d/c above.

## 2024-05-13 NOTE — H&P (Signed)
 History of Present Illness: Kathleen Everett is an 53 y.o. female presents for evaluation of her left hip. Patient reports she has had pain since last year localized over her left groin and left lateral thigh. She reports it is intermittent in nature and has become more constant over time describing a throbbing aching pain with sharp stabs of pain in the groin and more aching pain laterally over the hip. She denies any radiation down the leg of the pain or any associated numbness or tingling. She does report she has some ongoing back issues and is scheduled to have a left sacroiliac fusion in the future. She has undergone injections in the left hip and reports short-term relief from the shots and no long-term improvement. She had a similar pain profile in the right hip before going for right hip replacement in the past reports a good outcome from the surgery. She did have a fall the end of last year and developed some lateral hip pain over the right and the left hips which improved but she continues to have severe groin pain limiting her action especially in the left. The patient denies fevers, chills, numbness, tingling, shortness of breath, chest pain, recent illness, or any other trauma.  Patient is a non-smoker, nondiabetic with a BMI of 28.5 Past Medical History: Past Medical History:  Diagnosis Date  Anxiety  Chronic tension headaches  Complication of anesthesia  DDD (degenerative disc disease), lumbar  Depression  Diverticulitis  Dysmenorrhea  Dysplastic nevus 10/09/2015  right abdomen; moderate, limited margins free  Endometriosis  Gastroesophageal reflux disease without esophagitis 07/18/2015  Hidradenitis  History of Chiari malformation  History of dysmenorrhea  History of endometriosis  History of ovarian cyst  followed by Dr. Canary, stable  Hyperlipidemia  Mitral valve prolapse  Obesity  Osteoarthritis  PONV (postoperative nausea and vomiting)  Sleep apnea  TMJ  (dislocation of temporomandibular joint)  Vitamin B12 deficiency   Past Surgical History: Past Surgical History:  Procedure Laterality Date  TEMPOROMANDIBULAR JOINT ARTHROPLASTY 2000  CYSTOSCOPY Right 2001  Ovarian  REMOVAL OVARIAN CYST 2001  Hernia surgery Right 05/2002  inguinal  BREAST EXCISIONAL BIOPSY Left 2004  for benigh fiber adenoma  HYSTERECTOMY 03/27/2007  laparoscopic  COLONOSCOPY 08/17/2009  FH Colon Polyps (Father/Mother)  EGD 08/17/2009  COLONOSCOPY 03/23/2012  (Inpt) Colitis, FH Colon Polyps (Father/Mother): CBF 03/2017  COLONOSCOPY 04/28/2015  FH Colon Polyps (Father/Mother): CBF 04/2020  EGD 04/28/2015  No repeat per RTE  JOINT REPLACEMENT Right 12/28/2019  Menz  incision and drainage of hip Right 05/11/2020  Dr. Kathlynn  COLONOSCOPY 03/19/2021  Normal colon/FHx CC-M&F/Repeat 71yrs/CTL  EGD 03/19/2021  Normal EGD/Repeat as needed/CTL  CESAREAN SECTION 1999 & 2004  1999-due to breech and 2004- due to twins  CYSTOSCOPY  for bladder ulcers  LAPAROSCOPIC TUBAL LIGATION  Surgery for chiari decompression   Past Family History: Family History  Problem Relation Age of Onset  High blood pressure (Hypertension) Mother  Gout Mother  Anxiety Mother  Hyperlipidemia (Elevated cholesterol) Mother  Colon polyps Mother  Atrial fibrillation (Abnormal heart rhythm sometimes requiring treatment with blood thinners) Mother  Heart failure Father  Heart disease Father  Stroke Father  High blood pressure (Hypertension) Father  Anxiety Father  Hyperlipidemia (Elevated cholesterol) Father  Colon polyps Father  Cancer Father  Depression Father  Hyperlipidemia (Elevated cholesterol) Brother  Dementia Maternal Grandmother  Cirrhosis Maternal Grandmother  Cancer Maternal Grandmother  Glaucoma Maternal Grandfather  Dementia Paternal Grandmother  Glaucoma Paternal Grandmother  Cancer Paternal Grandmother  Colon cancer Paternal Aunt  Dementia Paternal Aunt  ADD / ADHD  Daughter  Breast cancer Neg Hx   Medications: Current Outpatient Medications  Medication Sig Dispense Refill  acetaminophen  (TYLENOL ) 500 MG tablet Take by mouth  AIMOVIG AUTOINJECTOR 140 mg/mL AtIn INJECT ONE SYRINGEFUL SUBCUTANEOUSLY EVERY 28 DAYS (NEEDS APPOINTMENT) 3 mL 0  alendronate (FOSAMAX) 70 MG tablet Take 1 tablet (70 mg total) by mouth every 7 (seven) days Take on an empty stomach with a full glass of water. Avoid mineral or well water. Do not eat or take other medications for at least 30 minutes after dose. Sit or stand for at least 30 minutes after dose. 6 tablet 0  calcitonin, salmon, (MIACALCIN) 200 unit/actuation nasal spray Place 1 spray into the left nostril once daily for 30 days 3.7 mL 0  calcium  citrate-vitamin D3 (CITRACAL+D) 315 mg-5 mcg (200 unit) tablet Take 2 tablets by mouth 2 (two) times daily with meals 60 tablet 0  clobetasoL  (TEMOVATE ) 0.05 % ointment Apply topically  cyanocobalamin  (VITAMIN B12) 1,000 mcg/mL injection INJECT ONE ML (CC) INTRAMUSCULARLY ONCE EVERY MONTH  diclofenac (VOLTAREN) 1 % topical gel Apply topically  ergocalciferol , vitamin D2, 1,250 mcg (50,000 unit) capsule Take 50,000 Units by mouth every 7 (seven) days  esomeprazole  (NEXIUM ) 40 MG DR capsule Take 1 capsule by mouth once daily  estradioL  (ESTRACE ) 0.01 % (0.1 mg/gram) vaginal cream Place vaginally as needed  HYDROcodone -acetaminophen  (NORCO) 10-325 mg tablet Take 1 tablet by mouth every 6 (six) hours as needed  ibuprofen  (MOTRIN ) 800 MG tablet Take by mouth  methocarbamoL  (ROBAXIN ) 500 MG tablet Take 1 tablet by mouth 4 times daily 60 tablet 0  naloxone  (NARCAN ) 4 mg/actuation nasal spray 1 spray by Nasal route as needed  rizatriptan (MAXALT) 10 MG tablet Take 1 tablet (10 mg total) by mouth as directed For Migraines 6 tablet 6  sertraline  (ZOLOFT ) 100 MG tablet Take 100 mg by mouth once daily  topiramate  (TOPAMAX ) 25 MG tablet 25 mg in the morning and 50 mg nightly (Patient not  taking: Reported on 11/05/2023) 21 tablet 0  ubrogepant 100 mg Tab Take 100 mg by mouth continuously as needed 10 tablet 6  XTAMPZA  ER 9 mg ER capsule Take 1 capsule by mouth 2 (two) times daily   No current facility-administered medications for this visit.   Allergies: Allergies  Allergen Reactions  Penicillins Other (See Comments)  Other Reaction: Other reaction  Sulfa (Sulfonamide Antibiotics) Other (See Comments)  Other Reaction: Other reaction  Vancomycin  Rash  Celexa [Citalopram] Diarrhea  Cefuroxime  Axetil Diarrhea  Upset stomach  Clindamycin  Hcl Rash    Visit Vitals: Vitals:  04/13/24 1126  BP: 118/82    Review of Systems:  A comprehensive 14 point ROS was performed, reviewed, and the pertinent orthopaedic findings are documented in the HPI.  Physical Exam: Body mass index is 28.56 kg/m. General/Constitutional: No apparent distress: well-nourished and well developed. Lymphatic: No palpable adenopathy. Pulmonary exam: Lungs clear to auscultation bilaterally no wheezing rales or rhonchi Cardiac exam: Regular rate and rhythm no obvious murmurs rubs or gallops. Vascular: No edema, swelling or tenderness, except as noted in detailed exam. Integumentary: No impressive skin lesions present, except as noted in detailed exam. Neuro/Psych: Normal mood and affect, oriented to person, place and time. Musculoskeletal: Normal, except as noted in detailed exam and in HPI.  Left hip exam  SKIN: intact SWELLING: none WARMTH: no warmth TENDERNESS: Mild tenderness over lateral hip does not reproduce her deeper pain, Madaline  Positive ROM: 0 degrees internal rotation and 30 degrees external rotation and pain with internal rotation,; Hip Flexion 95 STRENGTH: limited by pain GAIT: antalgic and stiff-legged STABILITY: stable to testing CREPITUS: no LEG LENGTH DISCREPANCY: none NEUROLOGICAL EXAM: normal VASCULAR EXAM: normal LUMBAR SPINE: tenderness: no straight leg raising  sign: no motor exam: normal  The contralateral hip was examined for comparison and it showed: TENDERNESS: Mild tenderness over the lateral hip reproduces some pain in the area of the greater trochanter and IT band ROM: normal and full STRENGTH: normal STABILITY: stable to testing  Hip Imaging :  I reviewed AP pelvis and lateral left hip x-rays were taken today in clinic images reviewed myself. There is been some progression of degenerative changes especially in the superior acetabulum muscular gnosis and early osteophyte formation. There is medial inferior joint space narrowing in the left hip as well. No displaced fractures noted. Right hip status post total hip arthroplasty with components unchanged position from prior films.  Narrative  This result has an attachment that is not available. CLINICAL DATA: Left hip pain. No known injury.  EXAM: MR OF THE LEFT HIP WITHOUT CONTRAST  TECHNIQUE: Multiplanar, multisequence MR imaging was performed. No intravenous contrast was administered.  COMPARISON: Plain films left hip 10/22/2022.  FINDINGS: Bones: There is marked marrow edema in the superior aspect of the left femoral neck most consistent with stress reaction. A subtle hypointense line through the superior aspect of the femoral neck is worrisome for an incomplete fracture, likely a stress injury. There is also marrow edema in the anterior aspect of the right acetabulum which could be due to stress reaction or secondary to degenerative disease.  Artifact from the patient's right hip replacement is noted. No focal marrow lesion is identified.  Articular cartilage and labrum  Articular cartilage: Mildly thinned without focal defect.  Labrum: The superior labrum is degenerated. No focal tear is seen.  Joint or bursal effusion  Joint effusion: None.  Bursae: Small volume of fluid is present in the left trochanteric bursa.  Muscles and tendons  Muscles and tendons:  Intact. Mild edema is seen in the right hamstrings at their origin consistent with tendinosis.  Other findings  Miscellaneous: The patient is status post hysterectomy.  IMPRESSION: 1. Findings most consistent with an incomplete stress fracture in the superior aspect of the left femoral neck. Marrow edema in the anterior left acetabulum could be due to stress change or secondary to degenerative disease. 2. Mild to moderate left hip osteoarthritis. 3. Mild left trochanteric bursitis. 4. Right hamstring tendinosis without tear. 5. Insert PRA call report  Electronically Signed By: Debby Prader M.D. On: 01/08/2024 09:20  Assessment:  Left hip osteoarthritis, left hip labral tear, left femoral neck stress fracture  Plan: Jama is a 53 year old female presents with severe left hip pain which is limiting her function. She is found to have a stress fracture which was treated nonoperatively with protected weightbearing, calcitonin, and alendronate. She is also found to have arthritis on that MRI possible labral tearing. She is continue to have ongoing pain and disability from the left hip despite treatment with medications, protected weightbearing, hip injection. Based upon the patient's continued symptoms and failure to respond to conservative treatment, I have recommended a left total hip replacement for this patient. A long discussion took place with the patient describing what a total joint replacement is and what the procedure would entail. A hip model, similar to the implants that will be used during the operation,  was utilized to demonstrate the implants. Choices of implant manufactures were discussed and reviewed. The ability to secure the implant utilizing cement or cementless (press fit) fixation was discussed. Anterior and posterior exposures were discussed. For this patient an appropriate approach will be anterior.   The hospitalization and post-operative care and rehabilitation were  also discussed. The use of perioperative antibiotics and DVT prophylaxis were discussed. The risk, benefits and alternatives to a surgical intervention were discussed at length with the patient. The patient was also advised of risks related to the medical comorbidities and elevated body mass index (BMI). A lengthy discussion took place to review the most common complications including but not limited to: deep vein thrombosis, pulmonary embolus, heart attack, stroke, infection, wound breakdown, heterotopic ossification, dislocation, numbness, leg length in-equality, intraoperative fracture, damage to nerves, tendon,muscles, arteries or other blood vessels, death and other possible complications from anesthesia. The patient was told that we will take steps to minimize these risks by using sterile technique, antibiotics and DVT prophylaxis when appropriate and follow the patient postoperatively in the office setting to monitor progress. The possibility of recurrent pain, no improvement in pain and actual worsening of pain were also discussed with the patient. The risk of dislocation following total hip replacement was discussed and potential precautions to prevent dislocation were reviewed.   Patient asked about and confirms no history of any reactions to metal or metal allergy in the past.  The discharge plan of care focused on the patient going home following surgery. The patient was encouraged to make the necessary arrangements to have someone stay with them when they are discharged home.   The benefits of surgery were discussed with the patient including the potential for improving the patient's current clinical condition through operative intervention. Alternatives to surgical intervention including continued conservative management were also discussed in detail. All questions were answered to the satisfaction of the patient. The patient participated and agreed to the plan of care as well as the use of the  recommended implants for their total hip replacement surgery. An information packet was given to the patient to review prior to surgery.   The patient received clearance for surgery. All questions answered patient agrees above plan for left anterior total hip replacement.  Portions of this record have been created using Scientist, clinical (histocompatibility and immunogenetics). Dictation errors have been sought, but may not have been identified and corrected.  Arthea Sheer MD

## 2024-05-13 NOTE — Transfer of Care (Cosign Needed)
 Immediate Anesthesia Transfer of Care Note  Patient: Kathleen Everett  Procedure(s) Performed: ARTHROPLASTY, HIP, TOTAL, ANTERIOR APPROACH (Left: Hip)  Patient Location: PACU  Anesthesia Type:General  Level of Consciousness: drowsy and patient cooperative  Airway & Oxygen Therapy: Patient Spontanous Breathing and Patient connected to face mask oxygen  Post-op Assessment: Report given to RN and Post -op Vital signs reviewed and stable  Post vital signs: Reviewed and stable  Last Vitals:  Vitals Value Taken Time  BP 107/86 05/13/24 12:15  Temp    Pulse 90 05/13/24 12:17  Resp 16 05/13/24 12:17  SpO2 100 % 05/13/24 12:17  Vitals shown include unfiled device data.  Last Pain:  Vitals:   05/13/24 0852  TempSrc: Temporal  PainSc: 5          Complications: No notable events documented.

## 2024-05-13 NOTE — Anesthesia Preprocedure Evaluation (Addendum)
 Anesthesia Evaluation  Patient identified by MRN, date of birth, ID band Patient awake    Reviewed: Allergy & Precautions, NPO status , Patient's Chart, lab work & pertinent test results  History of Anesthesia Complications (+) PONV and history of anesthetic complications  Airway Mallampati: II   Neck ROM: Full    Dental  (+) Missing, Caps   Pulmonary sleep apnea    Pulmonary exam normal breath sounds clear to auscultation       Cardiovascular hypertension, Normal cardiovascular exam+ Valvular Problems/Murmurs MVP  Rhythm:Regular Rate:Normal  ECG 04/30/24: normal   Neuro/Psych  Headaches PSYCHIATRIC DISORDERS Anxiety Depression    Chronic pain; Chairi I malformation s/p surgical decompression, residual dizziness and headaches    GI/Hepatic ,GERD  ,,  Endo/Other  negative endocrine ROS    Renal/GU negative Renal ROS     Musculoskeletal  (+) Arthritis ,    Abdominal   Peds  Hematology  (+) Blood dyscrasia, anemia   Anesthesia Other Findings   Reproductive/Obstetrics Endometriosis                              Anesthesia Physical Anesthesia Plan  ASA: 3  Anesthesia Plan: General   Post-op Pain Management:    Induction: Intravenous  PONV Risk Score and Plan: 4 or greater and Ondansetron , Dexamethasone, Treatment may vary due to age or medical condition and Scopolamine  patch - Pre-op  Airway Management Planned: Oral ETT  Additional Equipment:   Intra-op Plan:   Post-operative Plan: Extubation in OR  Informed Consent: I have reviewed the patients History and Physical, chart, labs and discussed the procedure including the risks, benefits and alternatives for the proposed anesthesia with the patient or authorized representative who has indicated his/her understanding and acceptance.     Dental advisory given  Plan Discussed with: CRNA  Anesthesia Plan Comments: (Patient  consented for risks of anesthesia including but not limited to:  - adverse reactions to medications - damage to eyes, teeth, lips or other oral mucosa - nerve damage due to positioning  - sore throat or hoarseness - damage to heart, brain, nerves, lungs, other parts of body or loss of life  Informed patient about role of CRNA in peri- and intra-operative care.  Patient voiced understanding.)        Anesthesia Quick Evaluation

## 2024-05-13 NOTE — Plan of Care (Signed)
   Problem: Elimination: Goal: Will not experience complications related to urinary retention Outcome: Progressing   Problem: Pain Managment: Goal: General experience of comfort will improve and/or be controlled Outcome: Progressing   Problem: Safety: Goal: Ability to remain free from injury will improve Outcome: Progressing

## 2024-05-14 ENCOUNTER — Telehealth: Admitting: Psychiatry

## 2024-05-14 ENCOUNTER — Encounter: Payer: Self-pay | Admitting: Orthopedic Surgery

## 2024-05-14 DIAGNOSIS — Z87728 Personal history of other specified (corrected) congenital malformations of nervous system and sense organs: Secondary | ICD-10-CM | POA: Diagnosis not present

## 2024-05-14 DIAGNOSIS — G4730 Sleep apnea, unspecified: Secondary | ICD-10-CM | POA: Diagnosis not present

## 2024-05-14 DIAGNOSIS — M1612 Unilateral primary osteoarthritis, left hip: Secondary | ICD-10-CM | POA: Diagnosis not present

## 2024-05-14 DIAGNOSIS — S73192A Other sprain of left hip, initial encounter: Secondary | ICD-10-CM | POA: Diagnosis not present

## 2024-05-14 DIAGNOSIS — G8929 Other chronic pain: Secondary | ICD-10-CM | POA: Diagnosis not present

## 2024-05-14 DIAGNOSIS — I10 Essential (primary) hypertension: Secondary | ICD-10-CM | POA: Diagnosis not present

## 2024-05-14 DIAGNOSIS — K219 Gastro-esophageal reflux disease without esophagitis: Secondary | ICD-10-CM | POA: Diagnosis not present

## 2024-05-14 DIAGNOSIS — I341 Nonrheumatic mitral (valve) prolapse: Secondary | ICD-10-CM | POA: Diagnosis not present

## 2024-05-14 LAB — CBC
HCT: 29.9 % — ABNORMAL LOW (ref 36.0–46.0)
Hemoglobin: 10.2 g/dL — ABNORMAL LOW (ref 12.0–15.0)
MCH: 30.5 pg (ref 26.0–34.0)
MCHC: 34.1 g/dL (ref 30.0–36.0)
MCV: 89.5 fL (ref 80.0–100.0)
Platelets: 143 10*3/uL — ABNORMAL LOW (ref 150–400)
RBC: 3.34 MIL/uL — ABNORMAL LOW (ref 3.87–5.11)
RDW: 11.7 % (ref 11.5–15.5)
WBC: 8.8 10*3/uL (ref 4.0–10.5)
nRBC: 0 % (ref 0.0–0.2)

## 2024-05-14 LAB — BASIC METABOLIC PANEL WITH GFR
Anion gap: 4 — ABNORMAL LOW (ref 5–15)
BUN: 13 mg/dL (ref 6–20)
CO2: 27 mmol/L (ref 22–32)
Calcium: 8.2 mg/dL — ABNORMAL LOW (ref 8.9–10.3)
Chloride: 110 mmol/L (ref 98–111)
Creatinine, Ser: 0.6 mg/dL (ref 0.44–1.00)
GFR, Estimated: >60 mL/min (ref >60)
Glucose, Bld: 109 mg/dL — ABNORMAL HIGH (ref 70–99)
Potassium: 3.8 mmol/L (ref 3.5–5.1)
Sodium: 141 mmol/L (ref 135–145)

## 2024-05-14 MED ORDER — DOCUSATE SODIUM 100 MG PO CAPS
ORAL_CAPSULE | ORAL | Status: AC
  Filled 2024-05-14: qty 1

## 2024-05-14 MED ORDER — ENOXAPARIN SODIUM 40 MG/0.4ML IJ SOSY
PREFILLED_SYRINGE | INTRAMUSCULAR | Status: AC
  Filled 2024-05-14: qty 0.4

## 2024-05-14 MED ORDER — ORAL CARE MOUTH RINSE: 15.0000 mL | OROMUCOSAL | Status: DC | PRN

## 2024-05-14 MED ORDER — CELECOXIB 200 MG PO CAPS
200.0000 mg | ORAL_CAPSULE | Freq: Two times a day (BID) | ORAL | 0 refills | Status: AC
Start: 2024-05-14 — End: 2024-05-24

## 2024-05-14 MED ORDER — CEPHALEXIN 500 MG PO CAPS: 500.0000 mg | ORAL_CAPSULE | Freq: Four times a day (QID) | ORAL | 0 refills | Status: AC

## 2024-05-14 MED ORDER — DOCUSATE SODIUM 100 MG PO CAPS: 100.0000 mg | ORAL_CAPSULE | Freq: Two times a day (BID) | ORAL | 0 refills | Status: DC

## 2024-05-14 MED ORDER — OXYCODONE HCL 5 MG PO TABS
ORAL_TABLET | ORAL | Status: AC
  Filled 2024-05-14: qty 1

## 2024-05-14 MED ORDER — OXYCODONE HCL 5 MG PO TABS: 5.0000 mg | ORAL_TABLET | ORAL | 0 refills | Status: DC | PRN

## 2024-05-14 MED ORDER — ACETAMINOPHEN 500 MG PO TABS: 1000.0000 mg | ORAL_TABLET | Freq: Three times a day (TID) | ORAL | 0 refills | Status: AC

## 2024-05-14 MED ORDER — SODIUM CHLORIDE 0.9 % IV BOLUS
500.0000 mL | Freq: Once | INTRAVENOUS | Status: AC
Start: 2024-05-14 — End: 2024-05-14
  Administered 2024-05-14: 500 mL via INTRAVENOUS

## 2024-05-14 MED ORDER — DEXAMETHASONE SODIUM PHOSPHATE 4 MG/ML IJ SOLN: 5.0000 mg | Freq: Once | INTRAMUSCULAR | Status: DC

## 2024-05-14 MED ORDER — ATORVASTATIN CALCIUM 20 MG PO TABS
ORAL_TABLET | ORAL | Status: AC
  Filled 2024-05-14: qty 1

## 2024-05-14 MED ORDER — PANTOPRAZOLE SODIUM 40 MG PO TBEC
DELAYED_RELEASE_TABLET | ORAL | Status: AC
  Filled 2024-05-14: qty 1

## 2024-05-14 MED ORDER — ONDANSETRON HCL 4 MG PO TABS: 4.0000 mg | ORAL_TABLET | Freq: Four times a day (QID) | ORAL | 0 refills | Status: DC | PRN

## 2024-05-14 MED ORDER — DEXAMETHASONE SODIUM PHOSPHATE 10 MG/ML IJ SOLN
5.0000 mg | Freq: Once | INTRAMUSCULAR | Status: AC
  Administered 2024-05-14: 5 mg via INTRAVENOUS

## 2024-05-14 MED ORDER — ENOXAPARIN SODIUM 40 MG/0.4ML IJ SOSY: 40.0000 mg | PREFILLED_SYRINGE | INTRAMUSCULAR | 0 refills | Status: DC

## 2024-05-14 MED ORDER — DEXAMETHASONE SODIUM PHOSPHATE 10 MG/ML IJ SOLN
INTRAMUSCULAR | Status: AC
  Filled 2024-05-14: qty 1

## 2024-05-14 NOTE — Discharge Instructions (Addendum)
 Instructions after Anterior Total Hip Replacement        Dr. Regenia Skeeter., M.D.      Dept. of Orthopaedics & Sports Medicine  [facility]  [address]  [place], [place]  [address]  Phone: [phone]   Fax: [phone]    DIET: Drink plenty of non-alcoholic fluids. Resume your normal diet. Include foods high in fiber.  ACTIVITY:  You may use crutches or a walker with weight-bearing as tolerated, unless instructed otherwise. You may be weaned off of the walker or crutches by your Physical Therapist.  Continue doing gentle exercises. Exercising will reduce the pain and swelling, increase motion, and prevent muscle weakness.   Please continue to use the TED compression stockings for 2 weeks. You may remove the stockings at night, but should reapply them in the morning. Do not drive or operate any equipment until instructed.  WOUND CARE:  Continue to use ice packs periodically to reduce pain and swelling. You may shower with honeycomb dressing 3 days after your surgery. Do not submerge incision site under water. Remove honeycomb dressing 7 days after surgery and allow dermabond to fall off on its own.   MEDICATIONS: You may resume your regular medications. Please take the pain medication as prescribed on the medication list. Do not take pain medication on an empty stomach. You have been given a prescription for a blood thinner to prevent blood clots. Please take the medication as instructed. (NOTE: After completing a 2 week course of Lovenox, take one Enteric-coated 81 mg aspirin twice a day for 3 additional weeks.) Pain medications and iron supplements can cause constipation. Use a stool softener (Senokot or Colace) on a daily basis and a laxative (dulcolax or miralax) as needed. Do not drive or drink alcoholic beverages when taking pain medications.  POSTOPERATIVE CONSTIPATION PROTOCOL Constipation - defined medically as fewer than three stools per week and  severe constipation as less than one stool per week.  One of the most common issues patients have following surgery is constipation.  Even if you have a regular bowel pattern at home, your normal regimen is likely to be disrupted due to multiple reasons following surgery.  Combination of anesthesia, postoperative narcotics, change in appetite and fluid intake all can affect your bowels.  In order to avoid complications following surgery, here are some recommendations in order to help you during your recovery period.  Colace (docusate) - Pick up an over-the-counter form of Colace or another stool softener and take twice a day as long as you are requiring postoperative pain medications.  Take with a full glass of water daily.  If you experience loose stools or diarrhea, hold the colace until you stool forms back up.  If your symptoms do not get better within 1 week or if they get worse, check with your doctor.  Dulcolax (bisacodyl) - Pick up over-the-counter and take as directed by the product packaging as needed to assist with the movement of your bowels.  Take with a full glass of water.  Use this product as needed if not relieved by Colace only.   MiraLax (polyethylene glycol) - Pick up over-the-counter to have on hand.  MiraLax is a solution that will increase the amount of water in your bowels to assist with bowel movements.  Take as directed and can mix with a glass of water, juice, soda, coffee, or tea.  Take if you go more than two days without a movement. Do not use MiraLax more than  once per day. Call your doctor if you are still constipated or irregular after using this medication for 7 days in a row.  If you continue to have problems with postoperative constipation, please contact the office for further assistance and recommendations.  If you experience "the worst abdominal pain ever" or develop nausea or vomiting, please contact the office immediatly for further recommendations for  treatment.   CALL THE OFFICE FOR: Temperature above 101 degrees Excessive bleeding or drainage on the dressing. Excessive swelling, coldness, or paleness of the toes. Persistent nausea and vomiting.  FOLLOW-UP:  You should have an appointment to return to the office in 2 weeks after surgery. Arrangements have been made for continuation of Physical Therapy (either home therapy or outpatient therapy).     [facility] They will call you to set up when they are coming out to see you   [address], [place], [place] [address] Hours:  Open ? Closes 5?PM Phone: [phone]

## 2024-05-14 NOTE — Plan of Care (Signed)
   Problem: Activity: Goal: Risk for activity intolerance will decrease Outcome: Progressing   Problem: Coping: Goal: Level of anxiety will decrease Outcome: Progressing   Problem: Pain Managment: Goal: General experience of comfort will improve and/or be controlled Outcome: Progressing

## 2024-05-14 NOTE — Evaluation (Signed)
 Physical Therapy Evaluation Patient Details Name: Kathleen Everett MRN: [id number] DOB: July 10, 1971 Today's Date: 05/14/2024  History of Present Illness  Pt is a 53 y.o. female with PMH of lumbar DDD, anxiety, depression, GERD, chiari malformation, HLD, and OA who presents S/P L THA anterior approach.  Clinical Impression  Pt was pleasant and motivated to participate during the session and put forth good effort throughout. Pt was provided HEP booklet to complete as tolerated until HHPT begins. Pt required CGA for ambulation and ascending/descending stairs with RW. Pt reported 6/10 on NPS in L hip,low back at the beginning of the session, which increased to 8/10 at the end of today's session. Pt required occasional standing breaks during ambulation due to pain, but remained steady throughout and no LOBs occurred. Pt was educated on how to get in and out of vehicle upon d/c. Pt reported slight SOB after second bout of ambulation, but SpO2 and HR WNL throughout session on room air. Pt will benefit from continued PT services upon discharge to safely address deficits listed in patient problem list for decreased caregiver assistance and eventual return to PLOF.         If plan is discharge home, recommend the following: A little help with walking and/or transfers;Assistance with cooking/housework;Assist for transportation;Help with stairs or ramp for entrance;A little help with bathing/dressing/bathroom   Can travel by private vehicle        Equipment Recommendations None recommended by PT  Recommendations for Other Services       Functional Status Assessment Patient has had a recent decline in their functional status and demonstrates the ability to make significant improvements in function in a reasonable and predictable amount of time.     Precautions / Restrictions Precautions Precautions: Anterior Hip Precaution Booklet Issued: Yes (comment) (Included with home exercise program) Recall  of Precautions/Restrictions: Intact Restrictions Weight Bearing Restrictions Per Provider Order: Yes LLE Weight Bearing Per Provider Order: Weight bearing as tolerated      Mobility  Bed Mobility Overal bed mobility: Independent             General bed mobility comments: Pt was independent with all bed mobility tasks without requiring physical assistance or use of bedrails.    Transfers Overall transfer level: Needs assistance Equipment used: Rolling walker (2 wheels) Transfers: Sit to/from Stand Sit to Stand: Contact guard assist           General transfer comment: Pt required CGA for STS from EOB with RW. VC's were utilized to extend LLE if needed due to pain to complete STS    Ambulation/Gait Ambulation/Gait assistance: Contact guard assist Gait Distance (Feet): 75 Feet x 1, 50 feet x 1 Assistive device: Rolling walker (2 wheels) Gait Pattern/deviations: Step-through pattern, Decreased step length - left, Decreased weight shift to left, Decreased stance time - left, Trunk flexed Gait velocity: decreased     General Gait Details: Pt required CGA with RW during ambulation and demonstrated decreased step length and stance time on LLE (expected given recent sx). Pt flexed trunk over RW and was dependent on RW to maintain balance. Second ambulation distance was determined by pt as pt requested to sit due to increase to 8/10 pain in L hip. Pt remained steady throughout ambulation and no LOBs occured.  Stairs Stairs: Yes Stairs assistance: Contact guard assist Stair Management: No rails Number of Stairs: 4 General stair comments: Pt was educated on proper gait sequence ascending/descending stairs and proper guarding techniques for caregivers with use  of RW and pt remained steady throughout and no LOBs occurred. Pt declined further practice ascending/descending stairs and stated that they felt confident doing this at home.   Wheelchair Mobility     Tilt Bed     Modified Rankin (Stroke Patients Only)       Balance Overall balance assessment: Needs assistance Sitting-balance support: Feet supported, No upper extremity supported Sitting balance-Leahy Scale: Good     Standing balance support: During functional activity, Bilateral upper extremity supported, Reliant on assistive device for balance Standing balance-Leahy Scale: Fair Standing balance comment: Pt reliant on RW to maintain balance during standing and ambulation, but remained steady throughout and no LOBs occured                             Pertinent Vitals/Pain Pain Assessment Pain Assessment: 0-10 Pain Score: 6  Pain Location: L hip and low back Pain Descriptors / Indicators: Contraction, Operative site guarding, Constant Pain Intervention(s): Monitored during session, RN gave pain meds during session    Home Living Family/patient expects to be discharged to:: Private residence Living Arrangements: Spouse/significant other;Parent Available Help at Discharge: Available 24 hours/day Type of Home: House Home Access: Stairs to enter Entrance Stairs-Rails: None Entrance Stairs-Number of Steps: 4   Home Layout: One level Home Equipment: Agricultural consultant (2 wheels);Cane - single point;Shower seat;Toilet riser;Grab bars - toilet; BSC      Prior Function Prior Level of Function : Independent/Modified Independent;History of Falls (last six months)             Mobility Comments: Pt reported being Mod I with community ambulation with the use of SPC 50% of the time (SPC use dependant on low back pain.) Pt reported 3 falls in past 6 months with 2 falls occuring tripping over objects and 1 fall occuring due to losing balance during ambulation. ADLs Comments: Pt reported being primarily independent with ADLs with no AD use, occasionally needing spouse to assist with getting in and out of the shower when low back pain is increased.     Extremity/Trunk Assessment   Upper  Extremity Assessment Upper Extremity Assessment: Overall WFL for tasks assessed    Lower Extremity Assessment Lower Extremity Assessment: Generalized weakness (Weakness on LLE due to being S/P L THA)       Communication   Communication Communication: No apparent difficulties    Cognition Arousal: Alert Behavior During Therapy: WFL for tasks assessed/performed   PT - Cognitive impairments: No apparent impairments                         Following commands: Intact       Cueing Cueing Techniques: Verbal cues, Gestural cues     General Comments      Exercises     Assessment/Plan    PT Assessment Patient needs continued PT services  PT Problem List Decreased strength;Decreased mobility;Decreased range of motion;Decreased balance;Pain       PT Treatment Interventions DME instruction;Therapeutic exercise;Gait training;Balance training;Stair training;Functional mobility training;Therapeutic activities;Patient/family education    PT Goals (Current goals can be found in the Care Plan section)  Acute Rehab PT Goals Patient Stated Goal: to be able to walk outside without pain PT Goal Formulation: With patient Time For Goal Achievement: 05/27/24 Potential to Achieve Goals: Good    Frequency BID     Co-evaluation  AM-PAC PT 6 Clicks Mobility  Outcome Measure Help needed turning from your back to your side while in a flat bed without using bedrails?: None Help needed moving from lying on your back to sitting on the side of a flat bed without using bedrails?: None Help needed moving to and from a bed to a chair (including a wheelchair)?: A Little Help needed standing up from a chair using your arms (e.g., wheelchair or bedside chair)?: A Little Help needed to walk in hospital room?: A Little Help needed climbing 3-5 steps with a railing? : A Little 6 Click Score: 20    End of Session Equipment Utilized During Treatment: Gait  belt Activity Tolerance: Patient tolerated treatment well Patient left: in chair;with call bell/phone within reach Nurse Communication: Mobility status PT Visit Diagnosis: Pain;Muscle weakness (generalized) (M62.81);Difficulty in walking, not elsewhere classified (R26.2) Pain - Right/Left: Left Pain - part of body: Hip    Time: 9090-9051 PT Time Calculation (min) (ACUTE ONLY): 39 min   Charges:                 Leontine Ingles, SPT 05/14/24, 1:17 PM

## 2024-05-14 NOTE — TOC Transition Note (Signed)
 Transition of Care [facility]) - Discharge Note   Patient Details  Name: Kathleen Everett MRN: [id number] Date of Birth: 1971-03-06  [facility] CM/SW Contact:  Quintella Suzen Jansky, RN Phone Number: 05/14/2024, 11:14 AM   Clinical Narrative:    Patient to discharge today, home with home health services. Patient set up with [facility] by surgeon's office prior to surgery. Agency information added to AVS   Barriers to Discharge: Barriers Unresolved (comment)   Patient Goals and CMS Choice Patient states their goals for this hospitalization and ongoing recovery are:: go home      Expected Discharge Plan and Services           Expected Discharge Date: 05/14/24                 DME Agency: NA       HH Arranged: PT, OT HH Agency: [facility] Date HH Agency Contacted: 05/14/24 Time HH Agency Contacted: 1113 Representative spoke with at [facility] Agency: Shaun  Prior Living Arrangements/Services                       Activities of Daily Living   ADL Screening (condition at time of admission) Independently performs ADLs?: Yes (appropriate for developmental age) Is the patient deaf or have difficulty hearing?: No Does the patient have difficulty seeing, even when wearing glasses/contacts?: No (wears glasses) Does the patient have difficulty concentrating, remembering, or making decisions?: No  Permission Sought/Granted                  Emotional Assessment              Admission diagnosis:  Primary osteoarthritis of left hip [M16.12] S/P total left hip arthroplasty [S03.357] Patient Active Problem List   Diagnosis Date Noted   S/P total left hip arthroplasty 05/13/2024   Viral URI 05/12/2024   Acute non-recurrent maxillary sinusitis 11/11/2023   Dysuria 10/31/2023   Depression 10/27/2023   Otalgia of left ear 09/11/2023   Other constipation 08/25/2023   Insulin  resistance 07/28/2023   Statin intolerance  07/28/2023   Dog bite of index finger 07/26/2023   Other fatigue 07/14/2023   Hypercholesterolemia 07/14/2023   Obesity, Beginning BMI 31.9 07/14/2023   COVID 05/23/2023   Genetic testing 12/30/2022   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   TMJ arthralgia 10/27/2022   Hip pain, left 10/22/2022   Sacral pain 10/22/2022   Rash 07/20/2022   Bloating 07/19/2022   Acute cystitis without hematuria 06/12/2022   Vulvar irritation 06/12/2022   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   Ear fullness 02/09/2022   Runny nose 09/30/2021   Dizziness 07/22/2021   B12 deficiency    Vaginal irritation 03/03/2021   Soft tissue mass 03/03/2021   Hemorrhoids 12/23/2020   DDD (degenerative disc disease), lumbar 10/03/2020   Abscess of right hip 05/11/2020   Dyspnea on exertion 05/08/2020   Healthcare maintenance 01/02/2020   Anemia 01/02/2020   Status post total hip replacement, 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   Hyperglycemia 06/24/2019   Vaginal bleeding 09/30/2016   Primary osteoarthritis of right hip 01/17/2016   Arnold-Chiari malformation (HCC)    Chronic gastritis 07/18/2015   Depression, major, in remission (HCC) 05/01/2015   Generalized anxiety disorder 05/01/2015   Chronic tension-type headache, intractable 04/24/2015   Dysmenorrhea 04/24/2015   Endometriosis 04/24/2015   Hidradenitis 04/24/2015   Hot flashes 03/12/2015   Chest pain 02/15/2015   BMI 31.0-31.9,adult 10/16/2014  Cough 09/28/2014   Breast pain, right 09/08/2014   Abdominal pain 07/24/2014   GERD (gastroesophageal reflux disease) 04/24/2014   Ankle pain, right 04/24/2014   Vitamin D  deficiency 01/18/2014   Diverticulitis 01/10/2014   Right hip pain 01/10/2014   Migraine headaches 01/10/2014   Obstructive sleep apnea 01/10/2014   Chronic back pain 12/13/2012   Mitral valve prolapse 12/13/2012   PCP:  Glendia Shad, MD Pharmacy:   [facility] [address] [place] - [address] [place]  [place] [address] Phone: [phone] Fax: [phone]  [facility] - [place], [place] - [address] [place] [address] Phone: [phone] Fax: [phone]     Social Determinants of Health (SDOH) Interventions    Readmission Risk Interventions     No data to display            Final next level of care: Home w Home Health Services Barriers to Discharge: Barriers Unresolved (comment)   Patient Goals and CMS Choice Patient states their goals for this hospitalization and ongoing recovery are:: go home          Discharge Placement                  Name of family member notified: [place] Patient and family notified of of transfer: 05/14/24  Discharge Plan and Services Additional resources added to the After Visit Summary for                    DME Agency: NA       HH Arranged: PT, OT HH Agency: [facility] Date HH Agency Contacted: 05/14/24 Time HH Agency Contacted: 1113 Representative spoke with at [facility] Agency: Shaun  Social Drivers of Health (SDOH) Interventions SDOH Screenings   Food Insecurity: No Food Insecurity (05/13/2024)  Housing: Low Risk  (05/13/2024)  Transportation Needs: No Transportation Needs (05/13/2024)  Utilities: Not At Risk (05/13/2024)  Alcohol  Screen: Low Risk  (03/16/2024)  Depression (PHQ2-9): Medium Risk (03/16/2024)  Financial Resource Strain: Low Risk  (04/13/2024)   Received from [facility]  Physical Activity: Unknown (03/16/2024)  Social Connections: Moderately Integrated (03/16/2024)  Stress: No Stress Concern Present (03/16/2024)  Tobacco Use: Low Risk  (05/14/2024)  Recent Concern: Tobacco Use - Medium Risk (04/25/2024)   Received from [facility]  Health Literacy: Inadequate Health Literacy (03/16/2024)     Readmission Risk Interventions     No data to display

## 2024-05-14 NOTE — Progress Notes (Signed)
 Subjective: 1 Day Post-Op Procedure(s) (LRB): ARTHROPLASTY, HIP, TOTAL, ANTERIOR APPROACH (Left) Patient reports pain as mild.   Patient is well, and has had no acute complaints or problems Denies any CP, SOB, ABD pain. We will continue therapy today.  Plan is to go Home after hospital stay.  Objective: Vital signs in last 24 hours: Temp:  [97.2 F (36.2 C)-99.3 F (37.4 C)] 97.4 F (36.3 C) (06/27 0725) Pulse Rate:  [63-86] 69 (06/27 0725) Resp:  [10-23] 18 (06/27 0725) BP: (98-144)/(55-86) 102/59 (06/27 0725) SpO2:  [97 %-100 %] 97 % (06/27 0725) Weight:  [93 kg] 93 kg (06/26 0852)  Intake/Output from previous day: 06/26 0701 - 06/27 0700 In: 1616.6 [I.V.:1216.5; IV Piggyback:400.1] Out: 200 [Blood:200] Intake/Output this shift: No intake/output data recorded.  Recent Labs    05/14/24 0541  HGB 10.2*   Recent Labs    05/14/24 0541  WBC 8.8  RBC 3.34*  HCT 29.9*  PLT 143*   Recent Labs    05/14/24 0541  NA 141  K 3.8  CL 110  CO2 27  BUN 13  CREATININE 0.60  GLUCOSE 109*  CALCIUM  8.2*   No results for input(s): LABPT, INR in the last 72 hours.  EXAM General - Patient is Alert, Appropriate, and Oriented Extremity - Neurovascular intact Sensation intact distally Intact pulses distally Dorsiflexion/Plantar flexion intact Dressing - dressing C/D/I and no drainage Motor Function - intact, moving foot and toes well on exam.   Past Medical History:  Diagnosis Date   Abdominal pain 07/24/2014   Abscess of right hip 05/11/2020   Anemia 01/02/2020   Ankle pain, right 04/24/2014   Anxiety    Attention deficit disorder (ADD)    B12 deficiency    Back pain    Breast pain, right 09/08/2014   Chest pain 02/15/2015   Chiari malformation type I 01/26/2014   s/p sgy decompression   Chronic back pain 12/13/2012   Chronic gastritis 07/18/2015   Chronic tension-type headache, intractable 04/24/2015   Constipation    DDD (degenerative disc  disease), lumbar    Depression    Diverticulitis    Dysmenorrhea    Dysplastic nevus 10/09/2015   R abdomen - moderate, limited margins free.   Endometriosis    Generalized anxiety disorder 05/01/2015   Has previously seen psychiatry. Having some issues with what she feels is related to stress and anxiety   GERD (gastroesophageal reflux disease)    Hemorrhoids 12/23/2020   Hidradenitis    High blood pressure    History of ovarian cyst    Hot flashes 03/12/2015   Hypercholesteremia 01/10/2014   Off lipitor. Elevated cholesterol. Labs discussed. Restart lipitor. Follow lipid panel and liver function tests.   Hyperglycemia 06/24/2019   IBS (irritable bowel syndrome)    Joint pain    Major depressive disorder 05/01/2015   Migraine headaches 01/10/2014   Has seen neurology. Topamax .   Mitral valve prolapse    Obesity    Obstructive sleep apnea 01/10/2014   nightly CPAP use   Osteoarthritis    PONV (postoperative nausea and vomiting)    Primary osteoarthritis of right hip 01/17/2016   Right hip pain 01/10/2014   Shortness of breath    Sleep apnea    Soft tissue mass 03/03/2021   Status post total hip replacement, 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   Swelling of both lower extremities    TMJ (dislocation of temporomandibular joint)    Vitamin B12 deficiency    On injections  Vitamin D  deficiency 01/18/2014   Overview:  Level is 21.6  Formatting of this note might be different from the original. Level is 21.6    Assessment/Plan:   1 Day Post-Op Procedure(s) (LRB): ARTHROPLASTY, HIP, TOTAL, ANTERIOR APPROACH (Left) Principal Problem:   S/P total left hip arthroplasty  Estimated body mass index is 29.41 kg/m as calculated from the following:   Height as of this encounter: 5' 10 (1.778 m).   Weight as of this encounter: 93 kg. Advance diet Up with therapy Pain well controlled VSS, blood pressure soft.  Patient asymptomatic.  Will give 500 cc bolus of fluids Labs are stable Care  management to assist with discharge to home with home health PT today pending safe completion of PT goals  DVT Prophylaxis - Lovenox , TED hose, and SCDs Weight-Bearing as tolerated to left leg   T. [place] Amber, PA-C [facility] 05/14/2024, 8:15 AM

## 2024-05-14 NOTE — Discharge Summary (Cosign Needed Addendum)
 Physician Discharge Summary  Patient ID: Kathleen Everett MRN: [id number] DOB/AGE: 1971/02/19 53 y.o.  Admit date: 05/13/2024 Discharge date: 05/14/2024  Admission Diagnoses:  Primary osteoarthritis of left hip [M16.12] S/P total left hip arthroplasty [S03.357]   Discharge Diagnoses: Patient Active Problem List   Diagnosis Date Noted   S/P total left hip arthroplasty 05/13/2024   Viral URI 05/12/2024   Acute non-recurrent maxillary sinusitis 11/11/2023   Dysuria 10/31/2023   Depression 10/27/2023   Otalgia of left ear 09/11/2023   Other constipation 08/25/2023   Insulin  resistance 07/28/2023   Statin intolerance 07/28/2023   Dog bite of index finger 07/26/2023   Other fatigue 07/14/2023   Hypercholesterolemia 07/14/2023   Obesity, Beginning BMI 31.9 07/14/2023   COVID 05/23/2023   Genetic testing 12/30/2022   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   TMJ arthralgia 10/27/2022   Hip pain, left 10/22/2022   Sacral pain 10/22/2022   Rash 07/20/2022   Bloating 07/19/2022   Acute cystitis without hematuria 06/12/2022   Vulvar irritation 06/12/2022   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   Ear fullness 02/09/2022   Runny nose 09/30/2021   Dizziness 07/22/2021   B12 deficiency    Vaginal irritation 03/03/2021   Soft tissue mass 03/03/2021   Hemorrhoids 12/23/2020   DDD (degenerative disc disease), lumbar 10/03/2020   Abscess of right hip 05/11/2020   Dyspnea on exertion 05/08/2020   Healthcare maintenance 01/02/2020   Anemia 01/02/2020   Status post total hip replacement, 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   Hyperglycemia 06/24/2019   Vaginal bleeding 09/30/2016   Primary osteoarthritis of right hip 01/17/2016   Arnold-Chiari malformation (HCC)    Chronic gastritis 07/18/2015   Depression, major, in remission (HCC) 05/01/2015   Generalized anxiety disorder 05/01/2015   Chronic tension-type headache, intractable 04/24/2015   Dysmenorrhea 04/24/2015   Endometriosis  04/24/2015   Hidradenitis 04/24/2015   Hot flashes 03/12/2015   Chest pain 02/15/2015   BMI 31.0-31.9,adult 10/16/2014   Cough 09/28/2014   Breast pain, right 09/08/2014   Abdominal pain 07/24/2014   GERD (gastroesophageal reflux disease) 04/24/2014   Ankle pain, right 04/24/2014   Vitamin D  deficiency 01/18/2014   Diverticulitis 01/10/2014   Right hip pain 01/10/2014   Migraine headaches 01/10/2014   Obstructive sleep apnea 01/10/2014   Chronic back pain 12/13/2012   Mitral valve prolapse 12/13/2012    Past Medical History:  Diagnosis Date   Abdominal pain 07/24/2014   Abscess of right hip 05/11/2020   Anemia 01/02/2020   Ankle pain, right 04/24/2014   Anxiety    Attention deficit disorder (ADD)    B12 deficiency    Back pain    Breast pain, right 09/08/2014   Chest pain 02/15/2015   Chiari malformation type I 01/26/2014   s/p sgy decompression   Chronic back pain 12/13/2012   Chronic gastritis 07/18/2015   Chronic tension-type headache, intractable 04/24/2015   Constipation    DDD (degenerative disc disease), lumbar    Depression    Diverticulitis    Dysmenorrhea    Dysplastic nevus 10/09/2015   R abdomen - moderate, limited margins free.   Endometriosis    Generalized anxiety disorder 05/01/2015   Has previously seen psychiatry. Having some issues with what she feels is related to stress and anxiety   GERD (gastroesophageal reflux disease)    Hemorrhoids 12/23/2020   Hidradenitis    High blood pressure    History of ovarian cyst    Hot flashes 03/12/2015   Hypercholesteremia 01/10/2014  Off lipitor. Elevated cholesterol. Labs discussed. Restart lipitor. Follow lipid panel and liver function tests.   Hyperglycemia 06/24/2019   IBS (irritable bowel syndrome)    Joint pain    Major depressive disorder 05/01/2015   Migraine headaches 01/10/2014   Has seen neurology. Topamax .   Mitral valve prolapse    Obesity    Obstructive sleep apnea 01/10/2014    nightly CPAP use   Osteoarthritis    PONV (postoperative nausea and vomiting)    Primary osteoarthritis of right hip 01/17/2016   Right hip pain 01/10/2014   Shortness of breath    Sleep apnea    Soft tissue mass 03/03/2021   Status post total hip replacement, 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   Swelling of both lower extremities    TMJ (dislocation of temporomandibular joint)    Vitamin B12 deficiency    On injections   Vitamin D  deficiency 01/18/2014   Overview:  Level is 21.6  Formatting of this note might be different from the original. Level is 21.6     Transfusion: none   Consultants (if any):   Discharged Condition: Improved  Hospital Course: Jaidyn Usery is an 53 y.o. female who was admitted 05/13/2024 with a diagnosis of S/P total left hip arthroplasty and went to the operating room on 05/13/2024 and underwent the above named procedures.    Surgeries: Procedure(s): ARTHROPLASTY, HIP, TOTAL, ANTERIOR APPROACH on 05/13/2024 Patient tolerated the surgery well. Taken to PACU where she was stabilized and then transferred to the orthopedic floor.  Started on Lovenox  40 mg q 24 hrs. TEDs and SCDs applied bilaterally. Heels elevated on bed. No evidence of DVT. Negative Homan. Physical therapy started on day #1 for gait training and transfer. OT started day #1 for ADL and assisted devices.  Patient's IV was d/c on day #1. Patient was able to safely and independently complete all PT goals. PT recommending discharge to home.    On post op day #1 patient was stable and ready for discharge to home with HHPT.  Implants:  Cup: Trident Tritanium Clusterhold 52/E w/x2 screws    Liner: Neutral X3 poly 36/E  Stem: Insignia #3 std offset  Head:Biolox ceramic 36 -2.78mm   She was given perioperative antibiotics:  Anti-infectives (From admission, onward)    Start     Dose/Rate Route Frequency Ordered Stop   05/14/24 0000  cephALEXin (KEFLEX) 500 MG capsule        500 mg Oral 4 times daily 05/14/24  0821 05/19/24 2359   05/13/24 1715  ceFAZolin  (ANCEF ) IVPB 2g/100 mL premix        2 g 200 mL/hr over 30 Minutes Intravenous Every 6 hours 05/13/24 1710 05/14/24 0011   05/13/24 0900  levofloxacin  (LEVAQUIN ) IVPB 500 mg        500 mg 100 mL/hr over 60 Minutes Intravenous On call to O.R. 05/13/24 [id number] 05/13/24 1000     .  She was given sequential compression devices, early ambulation, and Lovenox  TEDs for DVT prophylaxis.  She benefited maximally from the hospital stay and there were no complications.    Recent vital signs:  Vitals:   05/14/24 1048 05/14/24 1203  BP: (!) 85/48 (!) 98/56  Pulse:  63  Resp:  16  Temp:  (!) 97.5 F (36.4 C)  SpO2:  100%    Recent laboratory studies:  Lab Results  Component Value Date   HGB 10.2 (L) 05/14/2024   HGB 13.2 04/30/2024   HGB 13.5 09/11/2023   Lab  Results  Component Value Date   WBC 8.8 05/14/2024   PLT 143 (L) 05/14/2024   Lab Results  Component Value Date   INR 1.1 12/03/2019   Lab Results  Component Value Date   NA 141 05/14/2024   K 3.8 05/14/2024   CL 110 05/14/2024   CO2 27 05/14/2024   BUN 13 05/14/2024   CREATININE 0.60 05/14/2024   GLUCOSE 109 (H) 05/14/2024    Discharge Medications:   Allergies as of 05/14/2024       Reactions   Vancomycin  Hives   Ceftin  [cefuroxime  Axetil] Other (See Comments)   Upset stomach   Celexa [citalopram Hydrobromide] Diarrhea   GI upset    Clindamycin /lincomycin Rash   Penicillins Rash   Did it involve swelling of the face/tongue/throat, SOB, or low BP? No Did it involve sudden or severe rash/hives, skin peeling, or any reaction on the inside of your mouth or nose? Unknown Did you need to seek medical attention at a hospital or doctor's office? No When did it last happen? childhood reaction If all above answers are "NO", may proceed with cephalosporin use.   Sulfa Antibiotics Rash        Medication List     STOP taking these medications    ibuprofen  800 MG  tablet Commonly known as: ADVIL        TAKE these medications    acetaminophen  500 MG tablet Commonly known as: TYLENOL  Take 2 tablets (1,000 mg total) by mouth every 8 (eight) hours. What changed:  medication strength how much to take when to take this reasons to take this   Aimovig 140 MG/ML Soaj Generic drug: Erenumab-aooe Inject 140 mg into the skin every 28 (twenty-eight) days.   atorvastatin  20 MG tablet Commonly known as: LIPITOR Take 1 tablet (20 mg total) by mouth daily.   azelastine  0.1 % nasal spray Commonly known as: ASTELIN  Place 1 spray into both nostrils 2 (two) times daily. Use in each nostril as directed What changed:  when to take this reasons to take this   buPROPion  300 MG 24 hr tablet Commonly known as: WELLBUTRIN  XL Take 1 tablet (300 mg total) by mouth daily. 450 mg daily. Take along with 150 mg tab What changed: additional instructions   celecoxib 200 MG capsule Commonly known as: CeleBREX Take 1 capsule (200 mg total) by mouth 2 (two) times daily for 10 days.   cephALEXin 500 MG capsule Commonly known as: KEFLEX Take 1 capsule (500 mg total) by mouth 4 (four) times daily for 5 days.   cyanocobalamin  1000 MCG/ML injection Commonly known as: VITAMIN B12 INJECT 1 ML INTRAMUSCULARLY  ONCE EVERY MONTH   diclofenac Sodium 1 % Gel Commonly known as: VOLTAREN Apply 2 g topically daily as needed (pain).   docusate sodium  100 MG capsule Commonly known as: COLACE Take 1 capsule (100 mg total) by mouth 2 (two) times daily.   enoxaparin  40 MG/0.4ML injection Commonly known as: LOVENOX  Inject 0.4 mLs (40 mg total) into the skin daily for 14 days. Start taking on: May 15, 2024   esomeprazole  40 MG capsule Commonly known as: NEXIUM  TAKE 1 CAPSULE BY MOUTH TWICE DAILY BEFORE A MEAL   estradiol  0.05 MG/24HR patch Commonly known as: VIVELLE -DOT Place 1 patch (0.05 mg total) onto the skin 2 (two) times a week.   estradiol  0.1 MG/GM vaginal  cream Commonly known as: ESTRACE  Place 1 Applicatorful vaginally 4 (four) times a week. At bedtime   Eucrisa  2 % Oint  Generic drug: Crisaborole  Apply twice a day to hands prn flares   HYDROcodone -acetaminophen  10-325 MG tablet Commonly known as: NORCO Take 1 tablet by mouth in the morning, at noon, in the evening, and at bedtime.   metFORMIN  500 MG tablet Commonly known as: GLUCOPHAGE  Take 1 tablet (500 mg total) by mouth 2 (two) times daily with a meal.   methocarbamol  500 MG tablet Commonly known as: ROBAXIN  Take 500 mg by mouth 4 (four) times daily.   ondansetron  4 MG tablet Commonly known as: ZOFRAN  Take 1 tablet (4 mg total) by mouth every 6 (six) hours as needed for nausea.   oxyCODONE  5 MG immediate release tablet Commonly known as: Oxy IR/ROXICODONE  Take 1 tablet (5 mg total) by mouth every 4 (four) hours as needed for breakthrough pain or severe pain (pain score 7-10).   rizatriptan 10 MG tablet Commonly known as: MAXALT Take 10 mg by mouth as needed for migraine. May repeat in 2 hours if needed   sertraline  100 MG tablet Commonly known as: ZOLOFT  Take 1.5 tablets (150 mg total) by mouth at bedtime.   topiramate  25 MG tablet Commonly known as: TOPAMAX  Take 50 mg by mouth 2 (two) times daily.   Ubrelvy 100 MG Tabs Generic drug: Ubrogepant Take 100 mg by mouth 2 (two) times daily as needed (migraine).   Xtampza  ER 13.5 MG C12a Generic drug: oxyCODONE  ER Take 13.5 mg by mouth 2 (two) times daily.        Diagnostic Studies: DG HIP UNILAT WITH PELVIS 2-3 VIEWS LEFT Result Date: 05/13/2024 CLINICAL DATA:  Elective surgery. EXAM: DG HIP (WITH OR WITHOUT PELVIS) 2-3V LEFT COMPARISON:  None Available. FINDINGS: Three fluoroscopic spot views of the pelvis and left hip obtained in the operating room. Images during hip arthroplasty. Previous right hip arthroplasty. Fluoroscopy time 32 seconds. Dose 4.5367 mGy. IMPRESSION: Intraoperative fluoroscopy during left hip  arthroplasty. Electronically Signed   By: Andrea Gasman M.D.   On: 05/13/2024 12:27   DG C-Arm 1-60 Min-No Report Result Date: 05/13/2024 Fluoroscopy was utilized by the requesting physician.  No radiographic interpretation.   DG C-Arm 1-60 Min-No Report Result Date: 05/13/2024 Fluoroscopy was utilized by the requesting physician.  No radiographic interpretation.    Disposition: Discharge disposition: 06-Home-Health Care Svc          Follow-up Information     Charlene Debby BROCKS, PA-C. Go on 06/01/2024.   Specialties: Orthopedic Surgery, Emergency Medicine Why: at 2:45 pm, Post operative follow up, As Scheduled Contact information: [address] [place] [address] [phone]         [facility]  Follow up.   Contact information: [address] [place] [address] [phone]                  Signed: Debby BROCKS Charlene 05/14/2024, 1:45 PM

## 2024-05-15 DIAGNOSIS — K219 Gastro-esophageal reflux disease without esophagitis: Secondary | ICD-10-CM | POA: Diagnosis not present

## 2024-05-15 DIAGNOSIS — Z96643 Presence of artificial hip joint, bilateral: Secondary | ICD-10-CM | POA: Diagnosis not present

## 2024-05-15 DIAGNOSIS — K2950 Unspecified chronic gastritis without bleeding: Secondary | ICD-10-CM | POA: Diagnosis not present

## 2024-05-15 DIAGNOSIS — I110 Hypertensive heart disease with heart failure: Secondary | ICD-10-CM | POA: Diagnosis not present

## 2024-05-15 DIAGNOSIS — M26609 Unspecified temporomandibular joint disorder, unspecified side: Secondary | ICD-10-CM | POA: Diagnosis not present

## 2024-05-15 DIAGNOSIS — M51369 Other intervertebral disc degeneration, lumbar region without mention of lumbar back pain or lower extremity pain: Secondary | ICD-10-CM | POA: Diagnosis not present

## 2024-05-15 DIAGNOSIS — I4892 Unspecified atrial flutter: Secondary | ICD-10-CM | POA: Diagnosis not present

## 2024-05-15 DIAGNOSIS — R519 Headache, unspecified: Secondary | ICD-10-CM | POA: Diagnosis not present

## 2024-05-15 DIAGNOSIS — E7800 Pure hypercholesterolemia, unspecified: Secondary | ICD-10-CM | POA: Diagnosis not present

## 2024-05-15 DIAGNOSIS — Z8616 Personal history of COVID-19: Secondary | ICD-10-CM | POA: Diagnosis not present

## 2024-05-15 DIAGNOSIS — Z87891 Personal history of nicotine dependence: Secondary | ICD-10-CM | POA: Diagnosis not present

## 2024-05-15 DIAGNOSIS — I5031 Acute diastolic (congestive) heart failure: Secondary | ICD-10-CM | POA: Diagnosis not present

## 2024-05-15 DIAGNOSIS — E538 Deficiency of other specified B group vitamins: Secondary | ICD-10-CM | POA: Diagnosis not present

## 2024-05-15 DIAGNOSIS — K5792 Diverticulitis of intestine, part unspecified, without perforation or abscess without bleeding: Secondary | ICD-10-CM | POA: Diagnosis not present

## 2024-05-15 DIAGNOSIS — K589 Irritable bowel syndrome without diarrhea: Secondary | ICD-10-CM | POA: Diagnosis not present

## 2024-05-15 DIAGNOSIS — I341 Nonrheumatic mitral (valve) prolapse: Secondary | ICD-10-CM | POA: Diagnosis not present

## 2024-05-15 DIAGNOSIS — Z87798 Personal history of other (corrected) congenital malformations: Secondary | ICD-10-CM | POA: Diagnosis not present

## 2024-05-15 DIAGNOSIS — G8929 Other chronic pain: Secondary | ICD-10-CM | POA: Diagnosis not present

## 2024-05-15 DIAGNOSIS — Z471 Aftercare following joint replacement surgery: Secondary | ICD-10-CM | POA: Diagnosis not present

## 2024-05-15 DIAGNOSIS — I700 Atherosclerosis of aorta: Secondary | ICD-10-CM | POA: Diagnosis not present

## 2024-05-16 ENCOUNTER — Encounter: Payer: Self-pay | Admitting: Internal Medicine

## 2024-05-16 DIAGNOSIS — Z01818 Encounter for other preprocedural examination: Secondary | ICD-10-CM | POA: Insufficient documentation

## 2024-05-16 NOTE — Assessment & Plan Note (Signed)
 Recent hgb 13.2. follow.

## 2024-05-16 NOTE — Assessment & Plan Note (Signed)
 S/p surgery. Followed by neurology. Stable.

## 2024-05-16 NOTE — Assessment & Plan Note (Signed)
 Low carb diet. Follow met b and A1c.

## 2024-05-16 NOTE — Assessment & Plan Note (Signed)
 Followed by pain clinic. S/p removal of spinal cord stimulator. Stable.

## 2024-05-16 NOTE — Assessment & Plan Note (Addendum)
 Here for pre op evaluation. Planning for hip surgery. Had pre op EKG - SR with no acute ischemic changes. No acute symptoms. No chest pain or sob. No cough or congestion. Low risk to proceed with planned surgery. Form completed. She is not taking metformin . Surgery has discussed medications to stop.

## 2024-05-16 NOTE — Assessment & Plan Note (Signed)
 Continue nexium

## 2024-05-16 NOTE — Assessment & Plan Note (Signed)
 Followed by psychiatry. Continues on zoloft and wellbutrin.  Stable.

## 2024-05-17 ENCOUNTER — Encounter: Payer: Self-pay | Admitting: *Deleted

## 2024-05-17 ENCOUNTER — Telehealth: Payer: Self-pay | Admitting: *Deleted

## 2024-05-17 MED ORDER — FLUCONAZOLE 150 MG PO TABS
ORAL_TABLET | ORAL | 0 refills | Status: DC
Start: 2024-05-17 — End: 2024-06-08

## 2024-05-17 NOTE — Telephone Encounter (Signed)
 Please confirm she is talking about a vaginal yeast infection. Also confirm has taken diflucan  (oram medication) previously and tolerated.  Confirm allergies. If has taken and tolerated and is vaginal yeast infection, I can send in diflucan  150mg  x 1. May repeat x 1 in three days if persistent symptoms.  (#2 with no refills).

## 2024-05-17 NOTE — Telephone Encounter (Signed)
 Confirmed with patient vaginal yeast infection. Confirmed allergies. Diflucan  sent to pharmacy.

## 2024-05-17 NOTE — Telephone Encounter (Signed)
 Are you ok with sending in fluconazole  for her?

## 2024-05-17 NOTE — Telephone Encounter (Signed)
 Kathleen Everett (Key: [id number]) PA Case ID #: [id number] Submitted PA for Vivelle  Dot

## 2024-05-18 ENCOUNTER — Ambulatory Visit: Admitting: Licensed Clinical Social Worker

## 2024-05-18 DIAGNOSIS — Z87891 Personal history of nicotine dependence: Secondary | ICD-10-CM | POA: Diagnosis not present

## 2024-05-18 DIAGNOSIS — K589 Irritable bowel syndrome without diarrhea: Secondary | ICD-10-CM | POA: Diagnosis not present

## 2024-05-18 DIAGNOSIS — Z87798 Personal history of other (corrected) congenital malformations: Secondary | ICD-10-CM | POA: Diagnosis not present

## 2024-05-18 DIAGNOSIS — Z471 Aftercare following joint replacement surgery: Secondary | ICD-10-CM | POA: Diagnosis not present

## 2024-05-18 DIAGNOSIS — E7800 Pure hypercholesterolemia, unspecified: Secondary | ICD-10-CM | POA: Diagnosis not present

## 2024-05-18 DIAGNOSIS — Z96643 Presence of artificial hip joint, bilateral: Secondary | ICD-10-CM | POA: Diagnosis not present

## 2024-05-18 DIAGNOSIS — M26609 Unspecified temporomandibular joint disorder, unspecified side: Secondary | ICD-10-CM | POA: Diagnosis not present

## 2024-05-18 DIAGNOSIS — K219 Gastro-esophageal reflux disease without esophagitis: Secondary | ICD-10-CM | POA: Diagnosis not present

## 2024-05-18 DIAGNOSIS — I110 Hypertensive heart disease with heart failure: Secondary | ICD-10-CM | POA: Diagnosis not present

## 2024-05-18 DIAGNOSIS — K5792 Diverticulitis of intestine, part unspecified, without perforation or abscess without bleeding: Secondary | ICD-10-CM | POA: Diagnosis not present

## 2024-05-18 DIAGNOSIS — M51369 Other intervertebral disc degeneration, lumbar region without mention of lumbar back pain or lower extremity pain: Secondary | ICD-10-CM | POA: Diagnosis not present

## 2024-05-18 DIAGNOSIS — E538 Deficiency of other specified B group vitamins: Secondary | ICD-10-CM | POA: Diagnosis not present

## 2024-05-18 DIAGNOSIS — Z8616 Personal history of COVID-19: Secondary | ICD-10-CM | POA: Diagnosis not present

## 2024-05-18 DIAGNOSIS — R519 Headache, unspecified: Secondary | ICD-10-CM | POA: Diagnosis not present

## 2024-05-18 DIAGNOSIS — I5031 Acute diastolic (congestive) heart failure: Secondary | ICD-10-CM | POA: Diagnosis not present

## 2024-05-18 DIAGNOSIS — K2950 Unspecified chronic gastritis without bleeding: Secondary | ICD-10-CM | POA: Diagnosis not present

## 2024-05-18 DIAGNOSIS — I4892 Unspecified atrial flutter: Secondary | ICD-10-CM | POA: Diagnosis not present

## 2024-05-18 DIAGNOSIS — I700 Atherosclerosis of aorta: Secondary | ICD-10-CM | POA: Diagnosis not present

## 2024-05-18 DIAGNOSIS — G8929 Other chronic pain: Secondary | ICD-10-CM | POA: Diagnosis not present

## 2024-05-18 DIAGNOSIS — I341 Nonrheumatic mitral (valve) prolapse: Secondary | ICD-10-CM | POA: Diagnosis not present

## 2024-05-18 NOTE — Telephone Encounter (Signed)
 Patient's Vivelle  patches were denied Per insurance- Estradiol  twice weekly patch is denied because it is not on your plan's Drug List (formulary). Medication-authorization requires the following: You need to try one of these covered drugs: Estring , Femring, or Yuvafem 

## 2024-05-19 DIAGNOSIS — M51369 Other intervertebral disc degeneration, lumbar region without mention of lumbar back pain or lower extremity pain: Secondary | ICD-10-CM | POA: Diagnosis not present

## 2024-05-19 DIAGNOSIS — E538 Deficiency of other specified B group vitamins: Secondary | ICD-10-CM | POA: Diagnosis not present

## 2024-05-19 DIAGNOSIS — R519 Headache, unspecified: Secondary | ICD-10-CM | POA: Diagnosis not present

## 2024-05-19 DIAGNOSIS — M26609 Unspecified temporomandibular joint disorder, unspecified side: Secondary | ICD-10-CM | POA: Diagnosis not present

## 2024-05-19 DIAGNOSIS — Z87891 Personal history of nicotine dependence: Secondary | ICD-10-CM | POA: Diagnosis not present

## 2024-05-19 DIAGNOSIS — I4892 Unspecified atrial flutter: Secondary | ICD-10-CM | POA: Diagnosis not present

## 2024-05-19 DIAGNOSIS — K5792 Diverticulitis of intestine, part unspecified, without perforation or abscess without bleeding: Secondary | ICD-10-CM | POA: Diagnosis not present

## 2024-05-19 DIAGNOSIS — Z471 Aftercare following joint replacement surgery: Secondary | ICD-10-CM | POA: Diagnosis not present

## 2024-05-19 DIAGNOSIS — I5031 Acute diastolic (congestive) heart failure: Secondary | ICD-10-CM | POA: Diagnosis not present

## 2024-05-19 DIAGNOSIS — I700 Atherosclerosis of aorta: Secondary | ICD-10-CM | POA: Diagnosis not present

## 2024-05-19 DIAGNOSIS — Z87798 Personal history of other (corrected) congenital malformations: Secondary | ICD-10-CM | POA: Diagnosis not present

## 2024-05-19 DIAGNOSIS — Z8616 Personal history of COVID-19: Secondary | ICD-10-CM | POA: Diagnosis not present

## 2024-05-19 DIAGNOSIS — Z96643 Presence of artificial hip joint, bilateral: Secondary | ICD-10-CM | POA: Diagnosis not present

## 2024-05-19 DIAGNOSIS — K219 Gastro-esophageal reflux disease without esophagitis: Secondary | ICD-10-CM | POA: Diagnosis not present

## 2024-05-19 DIAGNOSIS — K589 Irritable bowel syndrome without diarrhea: Secondary | ICD-10-CM | POA: Diagnosis not present

## 2024-05-19 DIAGNOSIS — G8929 Other chronic pain: Secondary | ICD-10-CM | POA: Diagnosis not present

## 2024-05-19 DIAGNOSIS — K2950 Unspecified chronic gastritis without bleeding: Secondary | ICD-10-CM | POA: Diagnosis not present

## 2024-05-19 DIAGNOSIS — I110 Hypertensive heart disease with heart failure: Secondary | ICD-10-CM | POA: Diagnosis not present

## 2024-05-19 DIAGNOSIS — I341 Nonrheumatic mitral (valve) prolapse: Secondary | ICD-10-CM | POA: Diagnosis not present

## 2024-05-19 DIAGNOSIS — E7800 Pure hypercholesterolemia, unspecified: Secondary | ICD-10-CM | POA: Diagnosis not present

## 2024-05-20 DIAGNOSIS — M160 Bilateral primary osteoarthritis of hip: Secondary | ICD-10-CM | POA: Diagnosis not present

## 2024-05-20 DIAGNOSIS — M5412 Radiculopathy, cervical region: Secondary | ICD-10-CM | POA: Diagnosis not present

## 2024-05-20 DIAGNOSIS — M47816 Spondylosis without myelopathy or radiculopathy, lumbar region: Secondary | ICD-10-CM | POA: Diagnosis not present

## 2024-05-20 DIAGNOSIS — G2581 Restless legs syndrome: Secondary | ICD-10-CM | POA: Diagnosis not present

## 2024-05-20 DIAGNOSIS — M51379 Other intervertebral disc degeneration, lumbosacral region without mention of lumbar back pain or lower extremity pain: Secondary | ICD-10-CM | POA: Diagnosis not present

## 2024-05-20 DIAGNOSIS — G894 Chronic pain syndrome: Secondary | ICD-10-CM | POA: Diagnosis not present

## 2024-05-20 DIAGNOSIS — M5442 Lumbago with sciatica, left side: Secondary | ICD-10-CM | POA: Diagnosis not present

## 2024-05-20 DIAGNOSIS — M1611 Unilateral primary osteoarthritis, right hip: Secondary | ICD-10-CM | POA: Diagnosis not present

## 2024-05-21 DIAGNOSIS — Z87891 Personal history of nicotine dependence: Secondary | ICD-10-CM | POA: Diagnosis not present

## 2024-05-21 DIAGNOSIS — Z96643 Presence of artificial hip joint, bilateral: Secondary | ICD-10-CM | POA: Diagnosis not present

## 2024-05-21 DIAGNOSIS — E7800 Pure hypercholesterolemia, unspecified: Secondary | ICD-10-CM | POA: Diagnosis not present

## 2024-05-21 DIAGNOSIS — Z471 Aftercare following joint replacement surgery: Secondary | ICD-10-CM | POA: Diagnosis not present

## 2024-05-21 DIAGNOSIS — Z87798 Personal history of other (corrected) congenital malformations: Secondary | ICD-10-CM | POA: Diagnosis not present

## 2024-05-21 DIAGNOSIS — G8929 Other chronic pain: Secondary | ICD-10-CM | POA: Diagnosis not present

## 2024-05-21 DIAGNOSIS — M26609 Unspecified temporomandibular joint disorder, unspecified side: Secondary | ICD-10-CM | POA: Diagnosis not present

## 2024-05-21 DIAGNOSIS — I700 Atherosclerosis of aorta: Secondary | ICD-10-CM | POA: Diagnosis not present

## 2024-05-21 DIAGNOSIS — K2950 Unspecified chronic gastritis without bleeding: Secondary | ICD-10-CM | POA: Diagnosis not present

## 2024-05-21 DIAGNOSIS — R519 Headache, unspecified: Secondary | ICD-10-CM | POA: Diagnosis not present

## 2024-05-21 DIAGNOSIS — E538 Deficiency of other specified B group vitamins: Secondary | ICD-10-CM | POA: Diagnosis not present

## 2024-05-21 DIAGNOSIS — M51369 Other intervertebral disc degeneration, lumbar region without mention of lumbar back pain or lower extremity pain: Secondary | ICD-10-CM | POA: Diagnosis not present

## 2024-05-21 DIAGNOSIS — I4892 Unspecified atrial flutter: Secondary | ICD-10-CM | POA: Diagnosis not present

## 2024-05-21 DIAGNOSIS — K219 Gastro-esophageal reflux disease without esophagitis: Secondary | ICD-10-CM | POA: Diagnosis not present

## 2024-05-21 DIAGNOSIS — K5792 Diverticulitis of intestine, part unspecified, without perforation or abscess without bleeding: Secondary | ICD-10-CM | POA: Diagnosis not present

## 2024-05-21 DIAGNOSIS — K589 Irritable bowel syndrome without diarrhea: Secondary | ICD-10-CM | POA: Diagnosis not present

## 2024-05-21 DIAGNOSIS — I5031 Acute diastolic (congestive) heart failure: Secondary | ICD-10-CM | POA: Diagnosis not present

## 2024-05-21 DIAGNOSIS — I341 Nonrheumatic mitral (valve) prolapse: Secondary | ICD-10-CM | POA: Diagnosis not present

## 2024-05-21 DIAGNOSIS — Z8616 Personal history of COVID-19: Secondary | ICD-10-CM | POA: Diagnosis not present

## 2024-05-21 DIAGNOSIS — I110 Hypertensive heart disease with heart failure: Secondary | ICD-10-CM | POA: Diagnosis not present

## 2024-05-24 DIAGNOSIS — M51369 Other intervertebral disc degeneration, lumbar region without mention of lumbar back pain or lower extremity pain: Secondary | ICD-10-CM | POA: Diagnosis not present

## 2024-05-24 DIAGNOSIS — G8929 Other chronic pain: Secondary | ICD-10-CM | POA: Diagnosis not present

## 2024-05-24 DIAGNOSIS — R519 Headache, unspecified: Secondary | ICD-10-CM | POA: Diagnosis not present

## 2024-05-24 DIAGNOSIS — I700 Atherosclerosis of aorta: Secondary | ICD-10-CM | POA: Diagnosis not present

## 2024-05-24 DIAGNOSIS — I4892 Unspecified atrial flutter: Secondary | ICD-10-CM | POA: Diagnosis not present

## 2024-05-24 DIAGNOSIS — K589 Irritable bowel syndrome without diarrhea: Secondary | ICD-10-CM | POA: Diagnosis not present

## 2024-05-24 DIAGNOSIS — K219 Gastro-esophageal reflux disease without esophagitis: Secondary | ICD-10-CM | POA: Diagnosis not present

## 2024-05-24 DIAGNOSIS — Z471 Aftercare following joint replacement surgery: Secondary | ICD-10-CM | POA: Diagnosis not present

## 2024-05-24 DIAGNOSIS — Z96643 Presence of artificial hip joint, bilateral: Secondary | ICD-10-CM | POA: Diagnosis not present

## 2024-05-24 DIAGNOSIS — I341 Nonrheumatic mitral (valve) prolapse: Secondary | ICD-10-CM | POA: Diagnosis not present

## 2024-05-24 DIAGNOSIS — Z87798 Personal history of other (corrected) congenital malformations: Secondary | ICD-10-CM | POA: Diagnosis not present

## 2024-05-24 DIAGNOSIS — I5031 Acute diastolic (congestive) heart failure: Secondary | ICD-10-CM | POA: Diagnosis not present

## 2024-05-24 DIAGNOSIS — Z87891 Personal history of nicotine dependence: Secondary | ICD-10-CM | POA: Diagnosis not present

## 2024-05-24 DIAGNOSIS — E7800 Pure hypercholesterolemia, unspecified: Secondary | ICD-10-CM | POA: Diagnosis not present

## 2024-05-24 DIAGNOSIS — M26609 Unspecified temporomandibular joint disorder, unspecified side: Secondary | ICD-10-CM | POA: Diagnosis not present

## 2024-05-24 DIAGNOSIS — E538 Deficiency of other specified B group vitamins: Secondary | ICD-10-CM | POA: Diagnosis not present

## 2024-05-24 DIAGNOSIS — K5792 Diverticulitis of intestine, part unspecified, without perforation or abscess without bleeding: Secondary | ICD-10-CM | POA: Diagnosis not present

## 2024-05-24 DIAGNOSIS — I110 Hypertensive heart disease with heart failure: Secondary | ICD-10-CM | POA: Diagnosis not present

## 2024-05-24 DIAGNOSIS — Z8616 Personal history of COVID-19: Secondary | ICD-10-CM | POA: Diagnosis not present

## 2024-05-24 DIAGNOSIS — K2950 Unspecified chronic gastritis without bleeding: Secondary | ICD-10-CM | POA: Diagnosis not present

## 2024-05-25 DIAGNOSIS — I110 Hypertensive heart disease with heart failure: Secondary | ICD-10-CM | POA: Diagnosis not present

## 2024-05-25 DIAGNOSIS — Z87891 Personal history of nicotine dependence: Secondary | ICD-10-CM | POA: Diagnosis not present

## 2024-05-25 DIAGNOSIS — I5031 Acute diastolic (congestive) heart failure: Secondary | ICD-10-CM | POA: Diagnosis not present

## 2024-05-25 DIAGNOSIS — M26609 Unspecified temporomandibular joint disorder, unspecified side: Secondary | ICD-10-CM | POA: Diagnosis not present

## 2024-05-25 DIAGNOSIS — K5792 Diverticulitis of intestine, part unspecified, without perforation or abscess without bleeding: Secondary | ICD-10-CM | POA: Diagnosis not present

## 2024-05-25 DIAGNOSIS — I700 Atherosclerosis of aorta: Secondary | ICD-10-CM | POA: Diagnosis not present

## 2024-05-25 DIAGNOSIS — Z96643 Presence of artificial hip joint, bilateral: Secondary | ICD-10-CM | POA: Diagnosis not present

## 2024-05-25 DIAGNOSIS — K589 Irritable bowel syndrome without diarrhea: Secondary | ICD-10-CM | POA: Diagnosis not present

## 2024-05-25 DIAGNOSIS — R519 Headache, unspecified: Secondary | ICD-10-CM | POA: Diagnosis not present

## 2024-05-25 DIAGNOSIS — E538 Deficiency of other specified B group vitamins: Secondary | ICD-10-CM | POA: Diagnosis not present

## 2024-05-25 DIAGNOSIS — Z87798 Personal history of other (corrected) congenital malformations: Secondary | ICD-10-CM | POA: Diagnosis not present

## 2024-05-25 DIAGNOSIS — Z471 Aftercare following joint replacement surgery: Secondary | ICD-10-CM | POA: Diagnosis not present

## 2024-05-25 DIAGNOSIS — I4892 Unspecified atrial flutter: Secondary | ICD-10-CM | POA: Diagnosis not present

## 2024-05-25 DIAGNOSIS — K2950 Unspecified chronic gastritis without bleeding: Secondary | ICD-10-CM | POA: Diagnosis not present

## 2024-05-25 DIAGNOSIS — M51369 Other intervertebral disc degeneration, lumbar region without mention of lumbar back pain or lower extremity pain: Secondary | ICD-10-CM | POA: Diagnosis not present

## 2024-05-25 DIAGNOSIS — Z8616 Personal history of COVID-19: Secondary | ICD-10-CM | POA: Diagnosis not present

## 2024-05-25 DIAGNOSIS — I341 Nonrheumatic mitral (valve) prolapse: Secondary | ICD-10-CM | POA: Diagnosis not present

## 2024-05-25 DIAGNOSIS — G8929 Other chronic pain: Secondary | ICD-10-CM | POA: Diagnosis not present

## 2024-05-25 DIAGNOSIS — E7800 Pure hypercholesterolemia, unspecified: Secondary | ICD-10-CM | POA: Diagnosis not present

## 2024-05-25 DIAGNOSIS — K219 Gastro-esophageal reflux disease without esophagitis: Secondary | ICD-10-CM | POA: Diagnosis not present

## 2024-05-27 ENCOUNTER — Encounter: Payer: Self-pay | Admitting: Dermatology

## 2024-05-27 DIAGNOSIS — I4892 Unspecified atrial flutter: Secondary | ICD-10-CM | POA: Diagnosis not present

## 2024-05-27 DIAGNOSIS — Z96643 Presence of artificial hip joint, bilateral: Secondary | ICD-10-CM | POA: Diagnosis not present

## 2024-05-27 DIAGNOSIS — K2950 Unspecified chronic gastritis without bleeding: Secondary | ICD-10-CM | POA: Diagnosis not present

## 2024-05-27 DIAGNOSIS — Z8616 Personal history of COVID-19: Secondary | ICD-10-CM | POA: Diagnosis not present

## 2024-05-27 DIAGNOSIS — I700 Atherosclerosis of aorta: Secondary | ICD-10-CM | POA: Diagnosis not present

## 2024-05-27 DIAGNOSIS — Z87891 Personal history of nicotine dependence: Secondary | ICD-10-CM | POA: Diagnosis not present

## 2024-05-27 DIAGNOSIS — Z471 Aftercare following joint replacement surgery: Secondary | ICD-10-CM | POA: Diagnosis not present

## 2024-05-27 DIAGNOSIS — K589 Irritable bowel syndrome without diarrhea: Secondary | ICD-10-CM | POA: Diagnosis not present

## 2024-05-27 DIAGNOSIS — G8929 Other chronic pain: Secondary | ICD-10-CM | POA: Diagnosis not present

## 2024-05-27 DIAGNOSIS — E7800 Pure hypercholesterolemia, unspecified: Secondary | ICD-10-CM | POA: Diagnosis not present

## 2024-05-27 DIAGNOSIS — M26609 Unspecified temporomandibular joint disorder, unspecified side: Secondary | ICD-10-CM | POA: Diagnosis not present

## 2024-05-27 DIAGNOSIS — R519 Headache, unspecified: Secondary | ICD-10-CM | POA: Diagnosis not present

## 2024-05-27 DIAGNOSIS — K219 Gastro-esophageal reflux disease without esophagitis: Secondary | ICD-10-CM | POA: Diagnosis not present

## 2024-05-27 DIAGNOSIS — E538 Deficiency of other specified B group vitamins: Secondary | ICD-10-CM | POA: Diagnosis not present

## 2024-05-27 DIAGNOSIS — I110 Hypertensive heart disease with heart failure: Secondary | ICD-10-CM | POA: Diagnosis not present

## 2024-05-27 DIAGNOSIS — M51369 Other intervertebral disc degeneration, lumbar region without mention of lumbar back pain or lower extremity pain: Secondary | ICD-10-CM | POA: Diagnosis not present

## 2024-05-27 DIAGNOSIS — I341 Nonrheumatic mitral (valve) prolapse: Secondary | ICD-10-CM | POA: Diagnosis not present

## 2024-05-27 DIAGNOSIS — Z87798 Personal history of other (corrected) congenital malformations: Secondary | ICD-10-CM | POA: Diagnosis not present

## 2024-05-27 DIAGNOSIS — K5792 Diverticulitis of intestine, part unspecified, without perforation or abscess without bleeding: Secondary | ICD-10-CM | POA: Diagnosis not present

## 2024-05-27 DIAGNOSIS — I5031 Acute diastolic (congestive) heart failure: Secondary | ICD-10-CM | POA: Diagnosis not present

## 2024-05-30 ENCOUNTER — Encounter: Payer: Self-pay | Admitting: Dermatology

## 2024-05-30 MED ORDER — CLOBETASOL PROPIONATE 0.05 % EX CREA: 1.0000 | TOPICAL_CREAM | Freq: Two times a day (BID) | CUTANEOUS | 5 refills | Status: AC

## 2024-05-31 DIAGNOSIS — Z471 Aftercare following joint replacement surgery: Secondary | ICD-10-CM | POA: Diagnosis not present

## 2024-05-31 DIAGNOSIS — K219 Gastro-esophageal reflux disease without esophagitis: Secondary | ICD-10-CM | POA: Diagnosis not present

## 2024-05-31 DIAGNOSIS — I4892 Unspecified atrial flutter: Secondary | ICD-10-CM | POA: Diagnosis not present

## 2024-05-31 DIAGNOSIS — Z87891 Personal history of nicotine dependence: Secondary | ICD-10-CM | POA: Diagnosis not present

## 2024-05-31 DIAGNOSIS — K5792 Diverticulitis of intestine, part unspecified, without perforation or abscess without bleeding: Secondary | ICD-10-CM | POA: Diagnosis not present

## 2024-05-31 DIAGNOSIS — I5031 Acute diastolic (congestive) heart failure: Secondary | ICD-10-CM | POA: Diagnosis not present

## 2024-05-31 DIAGNOSIS — I110 Hypertensive heart disease with heart failure: Secondary | ICD-10-CM | POA: Diagnosis not present

## 2024-05-31 DIAGNOSIS — K589 Irritable bowel syndrome without diarrhea: Secondary | ICD-10-CM | POA: Diagnosis not present

## 2024-05-31 DIAGNOSIS — M51369 Other intervertebral disc degeneration, lumbar region without mention of lumbar back pain or lower extremity pain: Secondary | ICD-10-CM | POA: Diagnosis not present

## 2024-05-31 DIAGNOSIS — M26609 Unspecified temporomandibular joint disorder, unspecified side: Secondary | ICD-10-CM | POA: Diagnosis not present

## 2024-05-31 DIAGNOSIS — E7800 Pure hypercholesterolemia, unspecified: Secondary | ICD-10-CM | POA: Diagnosis not present

## 2024-05-31 DIAGNOSIS — K2950 Unspecified chronic gastritis without bleeding: Secondary | ICD-10-CM | POA: Diagnosis not present

## 2024-05-31 DIAGNOSIS — E538 Deficiency of other specified B group vitamins: Secondary | ICD-10-CM | POA: Diagnosis not present

## 2024-05-31 DIAGNOSIS — I341 Nonrheumatic mitral (valve) prolapse: Secondary | ICD-10-CM | POA: Diagnosis not present

## 2024-05-31 DIAGNOSIS — Z87798 Personal history of other (corrected) congenital malformations: Secondary | ICD-10-CM | POA: Diagnosis not present

## 2024-05-31 DIAGNOSIS — G8929 Other chronic pain: Secondary | ICD-10-CM | POA: Diagnosis not present

## 2024-05-31 DIAGNOSIS — Z96643 Presence of artificial hip joint, bilateral: Secondary | ICD-10-CM | POA: Diagnosis not present

## 2024-05-31 DIAGNOSIS — Z8616 Personal history of COVID-19: Secondary | ICD-10-CM | POA: Diagnosis not present

## 2024-05-31 DIAGNOSIS — R519 Headache, unspecified: Secondary | ICD-10-CM | POA: Diagnosis not present

## 2024-05-31 DIAGNOSIS — I700 Atherosclerosis of aorta: Secondary | ICD-10-CM | POA: Diagnosis not present

## 2024-06-02 DIAGNOSIS — I4892 Unspecified atrial flutter: Secondary | ICD-10-CM | POA: Diagnosis not present

## 2024-06-02 DIAGNOSIS — Z96643 Presence of artificial hip joint, bilateral: Secondary | ICD-10-CM | POA: Diagnosis not present

## 2024-06-02 DIAGNOSIS — Z87891 Personal history of nicotine dependence: Secondary | ICD-10-CM | POA: Diagnosis not present

## 2024-06-02 DIAGNOSIS — I5031 Acute diastolic (congestive) heart failure: Secondary | ICD-10-CM | POA: Diagnosis not present

## 2024-06-02 DIAGNOSIS — E538 Deficiency of other specified B group vitamins: Secondary | ICD-10-CM | POA: Diagnosis not present

## 2024-06-02 DIAGNOSIS — M51369 Other intervertebral disc degeneration, lumbar region without mention of lumbar back pain or lower extremity pain: Secondary | ICD-10-CM | POA: Diagnosis not present

## 2024-06-02 DIAGNOSIS — Z471 Aftercare following joint replacement surgery: Secondary | ICD-10-CM | POA: Diagnosis not present

## 2024-06-02 DIAGNOSIS — I341 Nonrheumatic mitral (valve) prolapse: Secondary | ICD-10-CM | POA: Diagnosis not present

## 2024-06-02 DIAGNOSIS — K219 Gastro-esophageal reflux disease without esophagitis: Secondary | ICD-10-CM | POA: Diagnosis not present

## 2024-06-02 DIAGNOSIS — M26609 Unspecified temporomandibular joint disorder, unspecified side: Secondary | ICD-10-CM | POA: Diagnosis not present

## 2024-06-02 DIAGNOSIS — Z8616 Personal history of COVID-19: Secondary | ICD-10-CM | POA: Diagnosis not present

## 2024-06-02 DIAGNOSIS — K589 Irritable bowel syndrome without diarrhea: Secondary | ICD-10-CM | POA: Diagnosis not present

## 2024-06-02 DIAGNOSIS — Z87798 Personal history of other (corrected) congenital malformations: Secondary | ICD-10-CM | POA: Diagnosis not present

## 2024-06-02 DIAGNOSIS — K5792 Diverticulitis of intestine, part unspecified, without perforation or abscess without bleeding: Secondary | ICD-10-CM | POA: Diagnosis not present

## 2024-06-02 DIAGNOSIS — R519 Headache, unspecified: Secondary | ICD-10-CM | POA: Diagnosis not present

## 2024-06-02 DIAGNOSIS — I700 Atherosclerosis of aorta: Secondary | ICD-10-CM | POA: Diagnosis not present

## 2024-06-02 DIAGNOSIS — E7800 Pure hypercholesterolemia, unspecified: Secondary | ICD-10-CM | POA: Diagnosis not present

## 2024-06-02 DIAGNOSIS — K2950 Unspecified chronic gastritis without bleeding: Secondary | ICD-10-CM | POA: Diagnosis not present

## 2024-06-02 DIAGNOSIS — I110 Hypertensive heart disease with heart failure: Secondary | ICD-10-CM | POA: Diagnosis not present

## 2024-06-02 DIAGNOSIS — G8929 Other chronic pain: Secondary | ICD-10-CM | POA: Diagnosis not present

## 2024-06-03 ENCOUNTER — Ambulatory Visit: Admitting: Internal Medicine

## 2024-06-04 ENCOUNTER — Ambulatory Visit (INDEPENDENT_AMBULATORY_CARE_PROVIDER_SITE_OTHER): Admitting: Licensed Clinical Social Worker

## 2024-06-04 DIAGNOSIS — F3341 Major depressive disorder, recurrent, in partial remission: Secondary | ICD-10-CM | POA: Diagnosis not present

## 2024-06-04 DIAGNOSIS — F411 Generalized anxiety disorder: Secondary | ICD-10-CM | POA: Diagnosis not present

## 2024-06-04 NOTE — Progress Notes (Signed)
 THERAPIST PROGRESS NOTE  Virtual Visit via Video Note  I connected with Kathleen Everett on 06/04/24 at  8:00 AM EDT by a video enabled telemedicine application and verified that I am speaking with the correct person using two identifiers.  Location: Patient: Address on file  Provider: Providers address   I discussed the limitations of evaluation and management by telemedicine and the availability of in person appointments. The patient expressed understanding and agreed to proceed.   I discussed the assessment and treatment plan with the patient. The patient was provided an opportunity to ask questions and all were answered. The patient agreed with the plan and demonstrated an understanding of the instructions.   The patient was advised to call back or seek an in-person evaluation if the symptoms worsen or if the condition fails to improve as anticipated.  I provided 60 minutes of non-face-to-face time during this encounter.   Kathleen KATHEE Husband, LCSW   Session Time: 8-9am  Participation Level: Active  Behavioral Response: CasualAlertEuthymic  Type of Therapy: Individual Therapy  Treatment Goals addressed:  Active     Anxiety     LTG: Kathleen Everett will score less than 5 on the Generalized Anxiety Disorder 7 Scale (GAD-7)      Start:  02/06/24    Expected End:  08/08/24         STG: Kathleen Everett will reduce frequency of avoidant behaviors by 50% as evidenced by self-report in therapy sessions     Start:  02/06/24    Expected End:  08/08/24         Perform psychoeducation regarding anxiety disorders     Start:  02/06/24         Work with patient individually to identify the major components of a recent episode of anxiety: physical symptoms, major thoughts and images, and major behaviors they experienced     Start:  02/06/24         Coping Skills      Start:  02/06/24       Will work with the pt using CBT/DBT techniques to help the pt  verbalize an understanding of the cognitive, physiological, and behavioral components of anxiety and its treatment. This will be done by using worksheets, interactive activities, CBT/ABC thought logs, modeling, homework, role playing and journaling. Will work with pt to learn and implement coping skills that result in a reduction of anxiety and improve daily functioning per pt self-report 3 out of 5 documented sessions.         OP Depression     LTG: Reduce frequency, intensity, and duration of depression symptoms so that daily functioning is improved     Start:  02/06/24    Expected End:  08/08/24         LTG: Increase coping skills to manage depression and improve ability to perform daily activities     Start:  02/06/24    Expected End:  08/08/24         LTG: Improve coping abilities with her chronic pain.      Start:  02/06/24    Expected End:  08/08/24         STG: Not to get so agitated      Start:  02/06/24    Expected End:  08/08/24         Work with Kathleen Everett to identify the major components of a recent episode of depression: physical symptoms, major thoughts and images, and  major behaviors they experienced     Start:  02/06/24         Therapist will educate patient on cognitive distortions and the rationale for treatment of depression     Start:  02/06/24         Kathleen Everett will identify 2 cognitive distortions they are currently using and write reframing statements to replace them     Start:  02/06/24         Coping Skills      Start:  02/06/24       Will work with the pt using CBT/DBT techniques to help the pt verbalize an understanding of the cognitive, physiological, and behavioral components of depression and its treatment. This will be done by using worksheets, interactive activities, CBT/ABC thought logs, modeling, homework, role playing and journaling. Will work with pt to learn and implement coping skills that result in a reduction  of depression and improve daily functioning per pt self-report 3 out of 5 documented sessions.          ProgressTowards Goals: Progressing  Interventions: CBT, Supportive, Reframing, and Other: Psychoeducation   Summary: Kathleen Everett is a 53 y.o. female who presents with Change in energy/activity; Difficulty Concentrating; Fatigue; Irritability; Sleep (too much or little); Tearfulness; Worthlessness; anxious feelings, and tension. Pt was oriented times 5. Pt was cooperative and engaged. Pt denies SI/HI/AVH.      The patient reflected on her recent hip surgery and reported experiencing chronic pain. She mentioned spending a lot of time alone, which has increased her anxiety due to her limited independence. As she navigates her healing journey, she has been feeling irritable and down, expressing frustration in learning to cope with her situation. To distract herself from the pain, she has turned to reading, coloring, and playing games.  During our conversation, the patient became tearful as she shared feelings of being unacknowledged for her contributions to her family. She expressed feeling overwhelmed by the tasks she needs to complete and discussed her struggles with task paralysis. We discussed her past successes in completing tasks to help build her confidence. Cln provided psychoeducation on task paralysis and explored coping strategies with her.  The patient also reported difficulties in aligning her personal goals and often attributed miscommunication as a barrier to achieving them. We identified ways she could seek additional support and reflected on her misplaced guilt and shame regarding her inability to work. Cln utilized Engineer, manufacturing systems Therapy (CBT) techniques, specifically Socratic questioning, to help her challenge the negative thoughts associated with guilt and shame. Together, we explored strategies for moving toward radical acceptance.  Closed out with bilateral  tapping to return to her baseline.   Suicidal/Homicidal: Nowithout intent/plan  Therapist Response: Cln utilized active and supportive reflection to assist in patient feeling safe to process. Cln assessed for current safety, symptoms, and stressors since last session. Cln provided psychoeducation on task paralysis and explored ways the patient can cope with task paralysis. Utilized CBT to work with the patient to challenge negative thoughts related to guilt/shame through Socratic questioning. Explored ways in which the patient could work toward radical acceptance.   Plan: Return again in 2 weeks.  Diagnosis: Generalized anxiety disorder  MDD (major depressive disorder), recurrent, in partial remission (HCC)   Collaboration of Care: AEB psychiatrist can access notes and cln. Will review psychiatrists' notes. Check in with the patient and will see LCSW per availability. Patient agreed with treatment recommendations.   Patient/Guardian was advised Release of  Information must be obtained prior to any record release in order to collaborate their care with an outside provider. Patient/Guardian was advised if they have not already done so to contact the registration department to sign all necessary forms in order for us  to release information regarding their care.   Consent: Patient/Guardian gives verbal consent for treatment and assignment of benefits for services provided during this visit. Patient/Guardian expressed understanding and agreed to proceed.   Kathleen KATHEE Husband, LCSW 06/04/2024

## 2024-06-07 DIAGNOSIS — Z96643 Presence of artificial hip joint, bilateral: Secondary | ICD-10-CM | POA: Diagnosis not present

## 2024-06-07 DIAGNOSIS — M26609 Unspecified temporomandibular joint disorder, unspecified side: Secondary | ICD-10-CM | POA: Diagnosis not present

## 2024-06-07 DIAGNOSIS — I5031 Acute diastolic (congestive) heart failure: Secondary | ICD-10-CM | POA: Diagnosis not present

## 2024-06-07 DIAGNOSIS — E7800 Pure hypercholesterolemia, unspecified: Secondary | ICD-10-CM | POA: Diagnosis not present

## 2024-06-07 DIAGNOSIS — Z471 Aftercare following joint replacement surgery: Secondary | ICD-10-CM | POA: Diagnosis not present

## 2024-06-07 DIAGNOSIS — I341 Nonrheumatic mitral (valve) prolapse: Secondary | ICD-10-CM | POA: Diagnosis not present

## 2024-06-07 DIAGNOSIS — M51369 Other intervertebral disc degeneration, lumbar region without mention of lumbar back pain or lower extremity pain: Secondary | ICD-10-CM | POA: Diagnosis not present

## 2024-06-07 DIAGNOSIS — R519 Headache, unspecified: Secondary | ICD-10-CM | POA: Diagnosis not present

## 2024-06-07 DIAGNOSIS — Z8616 Personal history of COVID-19: Secondary | ICD-10-CM | POA: Diagnosis not present

## 2024-06-07 DIAGNOSIS — G8929 Other chronic pain: Secondary | ICD-10-CM | POA: Diagnosis not present

## 2024-06-07 DIAGNOSIS — E538 Deficiency of other specified B group vitamins: Secondary | ICD-10-CM | POA: Diagnosis not present

## 2024-06-07 DIAGNOSIS — K5792 Diverticulitis of intestine, part unspecified, without perforation or abscess without bleeding: Secondary | ICD-10-CM | POA: Diagnosis not present

## 2024-06-07 DIAGNOSIS — I110 Hypertensive heart disease with heart failure: Secondary | ICD-10-CM | POA: Diagnosis not present

## 2024-06-07 DIAGNOSIS — K219 Gastro-esophageal reflux disease without esophagitis: Secondary | ICD-10-CM | POA: Diagnosis not present

## 2024-06-07 DIAGNOSIS — Z87891 Personal history of nicotine dependence: Secondary | ICD-10-CM | POA: Diagnosis not present

## 2024-06-07 DIAGNOSIS — I700 Atherosclerosis of aorta: Secondary | ICD-10-CM | POA: Diagnosis not present

## 2024-06-07 DIAGNOSIS — Z87798 Personal history of other (corrected) congenital malformations: Secondary | ICD-10-CM | POA: Diagnosis not present

## 2024-06-07 DIAGNOSIS — I4892 Unspecified atrial flutter: Secondary | ICD-10-CM | POA: Diagnosis not present

## 2024-06-07 DIAGNOSIS — K589 Irritable bowel syndrome without diarrhea: Secondary | ICD-10-CM | POA: Diagnosis not present

## 2024-06-07 DIAGNOSIS — K2950 Unspecified chronic gastritis without bleeding: Secondary | ICD-10-CM | POA: Diagnosis not present

## 2024-06-08 ENCOUNTER — Other Ambulatory Visit: Payer: Self-pay | Admitting: Internal Medicine

## 2024-06-08 ENCOUNTER — Ambulatory Visit (INDEPENDENT_AMBULATORY_CARE_PROVIDER_SITE_OTHER): Admitting: Internal Medicine

## 2024-06-08 ENCOUNTER — Encounter: Payer: Self-pay | Admitting: Internal Medicine

## 2024-06-08 VITALS — BP 102/64 | HR 86 | Resp 16 | Ht 70.0 in | Wt 206.0 lb

## 2024-06-08 DIAGNOSIS — R739 Hyperglycemia, unspecified: Secondary | ICD-10-CM

## 2024-06-08 DIAGNOSIS — E7800 Pure hypercholesterolemia, unspecified: Secondary | ICD-10-CM | POA: Diagnosis not present

## 2024-06-08 DIAGNOSIS — E538 Deficiency of other specified B group vitamins: Secondary | ICD-10-CM

## 2024-06-08 DIAGNOSIS — Z96642 Presence of left artificial hip joint: Secondary | ICD-10-CM

## 2024-06-08 DIAGNOSIS — R1084 Generalized abdominal pain: Secondary | ICD-10-CM

## 2024-06-08 DIAGNOSIS — D649 Anemia, unspecified: Secondary | ICD-10-CM

## 2024-06-08 DIAGNOSIS — R1032 Left lower quadrant pain: Secondary | ICD-10-CM

## 2024-06-08 MED ORDER — ATORVASTATIN CALCIUM 20 MG PO TABS: 20.0000 mg | ORAL_TABLET | Freq: Every day | ORAL | 1 refills | Status: DC

## 2024-06-08 MED ORDER — ESOMEPRAZOLE MAGNESIUM 40 MG PO CPDR: 40.0000 mg | DELAYED_RELEASE_CAPSULE | Freq: Two times a day (BID) | ORAL | 2 refills | Status: DC

## 2024-06-08 NOTE — Assessment & Plan Note (Signed)
 Recent surgery as outlined. Going to PT. Followed by ortho. With increased abdominal pain, plan CT as outlined.

## 2024-06-08 NOTE — Assessment & Plan Note (Addendum)
 Increased abdominal pain.  Started last week, improved and now worse again.  Increased pain. Nausea. Decreased appetite. Has a history of diverticulitis. Does feel similar to previous diverticular pain. Bowels as outlined. Concern given recent hip surgery, want to confirm etiology of increased pain. Confirm no other infection, abscess, etc. Will check stat CT scan. Further w/up / treatment pending results. Also check cbc, met b and liver panel.

## 2024-06-08 NOTE — Assessment & Plan Note (Signed)
Check cbc today 

## 2024-06-08 NOTE — Assessment & Plan Note (Signed)
 Low carb diet. Follow met b and A1c.

## 2024-06-08 NOTE — Assessment & Plan Note (Signed)
On atorvastatin.  Low cholesterol diet and exercise.  Follow lipid panel and liver function tests.

## 2024-06-08 NOTE — Progress Notes (Signed)
 Subjective:    Patient ID: Kathleen Everett, female    DOB: 12/23/1970, 53 y.o.   MRN: [id number]  Patient here for  Chief Complaint  Patient presents with   Medical Management of Chronic Issues    HPI Here for a scheduled follow up - follow up regarding GERd, hyperglycemia and increased stress.  Is s/p total left hip arthroplasty 05/13/24. Last f/u 06/01/24. Recommended to continue with PT exercises. F/u 4 weeks. She is doing relatively well regarding her hip.  She does report that starting last Wednesday she noticed some abdominal discomfort and nausea.  She used a heating pad over her abdomen.  The pain improved for a brief period.  This morning she woke with worsening pain.  Also increased nausea.  She took Zofran .  This did help for a while.  Still with increased discomfort.  She does have a history of diverticulitis.  States this does feel similar to previous diverticular flares.  No actual vomiting.  Some nausea.  Some decreased p.o. intake.  She has been eating yogurt and eating nuts.  Bowels have been moving.  She had hard balls this morning.  No blood.  No documented fever.   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   Endometriosis    Generalized anxiety disorder 05/01/2015   Has previously seen psychiatry. Having some issues with what she feels is related to stress  and anxiety   GERD (gastroesophageal reflux disease)    Hemorrhoids 12/23/2020   Hidradenitis    High blood pressure    History of ovarian cyst    Hot flashes 03/12/2015   Hypercholesteremia 01/10/2014   Off lipitor. Elevated cholesterol. Labs discussed. Restart lipitor. Follow lipid panel and liver function tests.   Hyperglycemia 06/24/2019   IBS (irritable bowel syndrome)    Joint pain    Major depressive disorder 05/01/2015   Migraine headaches 01/10/2014   Has seen neurology. Topamax .   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 Level is 21.6   Vulvar atrophy    Past Surgical History:  Procedure Laterality Date   ABDOMINAL  HYSTERECTOMY  03/27/07   laparoscopic   BREAST EXCISIONAL BIOPSY Left 2004   fibroadenoma   CESAREAN SECTION     1999/2004 twins   chiari decompression     COLONOSCOPY WITH PROPOFOL  N/A 04/28/2015   Procedure: COLONOSCOPY WITH PROPOFOL ;  Surgeon: Lamar ONEIDA Holmes, MD;  Location: [facility] ENDOSCOPY;  Service: Endoscopy;  Laterality: N/A;   COLONOSCOPY WITH PROPOFOL  N/A 03/19/2021   Procedure: COLONOSCOPY WITH PROPOFOL ;  Surgeon: Maryruth Ole ONEIDA, MD;  Location: [facility] ENDOSCOPY;  Service: Endoscopy;  Laterality: N/A;   COLPOSCOPY N/A 04/24/2022   Procedure: COLPOSCOPY, ABLATION OF VULVAR TISSUE;  Surgeon: Elby Webb Loges, MD;  Location: [facility] ORS;  Service: Gynecology;   Laterality: N/A;   CYSTOSCOPY     bladder ulcers   ESOPHAGOGASTRODUODENOSCOPY N/A 04/28/2015   Procedure: ESOPHAGOGASTRODUODENOSCOPY (EGD);  Surgeon: Lamar ONEIDA Holmes, MD;  Location: [facility] ENDOSCOPY;  Service: Endoscopy;  Laterality: N/A;   ESOPHAGOGASTRODUODENOSCOPY (EGD) WITH PROPOFOL  N/A 03/19/2021   Procedure: ESOPHAGOGASTRODUODENOSCOPY (EGD) WITH PROPOFOL ;  Surgeon: Maryruth Ole ONEIDA, MD;  Location: [facility] ENDOSCOPY;  Service: Endoscopy;  Laterality: N/A;   HERNIA REPAIR Right 2003   inguinal   INCISION AND DRAINAGE HIP Right 05/11/2020   Procedure: IRRIGATION AND DEBRIDEMENT HIP;  Surgeon: Kathlynn Sharper, MD;  Location: [facility] ORS;  Service: Orthopedics;  Laterality: Right;   OVARIAN CYST REMOVAL  2001   right   TEMPOROMANDIBULAR JOINT SURGERY  2000   TOTAL HIP ARTHROPLASTY Right 12/28/2019   Procedure: TOTAL HIP ARTHROPLASTY ANTERIOR APPROACH;  Surgeon: Kathlynn Sharper, MD;  Location: [facility] ORS;  Service: Orthopedics;  Laterality: Right;   TOTAL HIP ARTHROPLASTY Left 05/13/2024   Procedure: ARTHROPLASTY, HIP, TOTAL, ANTERIOR APPROACH;  Surgeon: Lorelle Hussar, MD;  Location: [facility] ORS;  Service: Orthopedics;  Laterality: Left;   TUBAL LIGATION     Bilateral   VULVA /PERINEUM BIOPSY  04/24/2022   Procedure: VULVAR BIOPSY;  Surgeon: Elby Webb Loges, MD;  Location: [facility] ORS;  Service: Gynecology;;   Family History  Problem Relation Age of Onset   Hyperlipidemia Mother    Anxiety disorder Mother    Atrial fibrillation Mother    Hypertension Mother    Memory loss Mother    Anxiety disorder Father    Stroke Father    Hyperlipidemia Father    Hypertension Father    Congestive Heart Failure Father    Depression Father    Hyperlipidemia Brother    Colon cancer Paternal Aunt    Dementia Paternal Aunt    Dementia Maternal Grandmother    Cancer Maternal Grandmother        unk primary; d. late 36s   Dementia Paternal Grandmother    ADD / ADHD Daughter    Breast cancer Neg Hx     Social History   Socioeconomic History   Marital status: Married    Spouse name: Medford   Number of children: 3   Years of education: 13   Highest education level: Some college, no degree  Occupational History   Occupation: Disability  Tobacco Use   Smoking status: Never   Smokeless tobacco: Never  Vaping Use   Vaping status: Never Used  Substance and Sexual Activity   Alcohol  use: Not Currently    Alcohol /week: 0.0 standard drinks of alcohol    Drug use: No   Sexual activity: Yes    Birth control/protection: Surgical    Comment: Hysterectomy  Other Topics Concern   Not on file  Social History Narrative   Patient drinks 2-3 cups of caffeine  daily.   Patient is right handed.       Social Hx:   Current living situation- [place]. Living with husband and 3 kids   Born and Raised- [place]raised by both parents.    Siblings- 1 brother who is 7 yrs older than her   Legal issues- none   Married for 24 yrs. Currently unemployed since 2011 when she hurt her back. HS grad and CNA and phlebotomy.    Social Drivers of Corporate investment banker Strain: Low Risk  (04/13/2024)   Received from [facility]   Overall Financial Resource Strain (CARDIA)    Difficulty of Paying Living Expenses: Not hard at all  Food Insecurity: No Food Insecurity (05/13/2024)   Hunger Vital Sign    Worried About Running Out of Food in the Last Year: Never true    Ran Out of Food in the Last Year: Never true  Transportation Needs: No Transportation Needs (05/13/2024)   PRAPARE - Administrator, Civil Service (Medical): No    Lack of Transportation (Non-Medical): No  Physical Activity: Unknown (03/16/2024)   Exercise Vital Sign    Days of Exercise per Week: Not on file    Minutes of Exercise per Session: 0 min  Stress: No Stress Concern Present (03/16/2024)   [employer] of Occupational Health - Occupational Stress Questionnaire    Feeling of Stress : Only a little   Social Connections: Moderately Integrated (03/16/2024)   Social Connection and Isolation Panel    Frequency of Communication with Friends and Family: More than three times a week    Frequency of Social Gatherings with Friends and Family: Once a week    Attends Religious Services: More than 4 times per year    Active Member of [employer] or Organizations: No    Attends Banker Meetings: Never    Marital Status: Married     Review of Systems  Constitutional:  Negative for fever.       Some appetite change.   HENT:  Negative for congestion and sinus pressure.   Respiratory:  Negative for cough, chest tightness and shortness of breath.   Cardiovascular:  Negative for chest pain, palpitations and leg swelling.  Gastrointestinal:  Positive for abdominal pain and nausea. Negative for blood in stool and vomiting.  Genitourinary:  Negative for difficulty urinating and dysuria.  Musculoskeletal:  Negative for myalgias.       S/p hip surgery.   Skin:  Negative for color change and rash.  Neurological:  Negative for dizziness and headaches.  Psychiatric/Behavioral:  Negative for agitation and dysphoric mood.        Objective:     BP 102/64   Pulse 86   Resp 16   Ht 5' 10 (1.778 m)   Wt 206 lb (93.4 kg)   LMP 12/08/2006   SpO2 99%   BMI 29.56 kg/m  Wt Readings from Last 3 Encounters:  06/08/24 206 lb (93.4 kg)  05/13/24 205 lb (93 kg)  05/11/24 205 lb (93 kg)    Physical Exam Vitals reviewed.  Constitutional:      General: She is not in acute distress.    Appearance: Normal appearance.  HENT:     Head: Normocephalic and atraumatic.     Right Ear: External ear normal.     Left Ear: External ear normal.  Eyes:     General: No scleral icterus.       Right eye: No discharge.  Left eye: No discharge.     Conjunctiva/sclera: Conjunctivae normal.  Neck:     Thyroid : No thyromegaly.  Cardiovascular:     Rate and Rhythm: Normal rate and regular rhythm.   Pulmonary:     Effort: No respiratory distress.     Breath sounds: Normal breath sounds. No wheezing.  Abdominal:     General: Bowel sounds are normal.     Palpations: Abdomen is soft.     Comments: Increased tenderness to palpation left lower quadrante and lower mid abdomen.   Musculoskeletal:        General: No swelling or tenderness.     Cervical back: Neck supple. No tenderness.  Lymphadenopathy:     Cervical: No cervical adenopathy.  Skin:    Findings: No erythema or rash.  Neurological:     Mental Status: She is alert.  Psychiatric:        Mood and Affect: Mood normal.        Behavior: Behavior normal.         Outpatient Encounter Medications as of 06/08/2024  Medication Sig   acetaminophen  (TYLENOL ) 500 MG tablet Take 2 tablets (1,000 mg total) by mouth every 8 (eight) hours.   AIMOVIG 140 MG/ML SOAJ Inject 140 mg into the skin every 28 (twenty-eight) days.   aspirin EC 81 MG tablet Take 162 mg by mouth daily. Swallow whole.   azelastine  (ASTELIN ) 0.1 % nasal spray Place 1 spray into both nostrils 2 (two) times daily. Use in each nostril as directed (Patient taking differently: Place 1 spray into both nostrils 2 (two) times daily as needed for allergies or rhinitis. Use in each nostril as directed)   buPROPion  (WELLBUTRIN  XL) 300 MG 24 hr tablet Take 1 tablet (300 mg total) by mouth daily. 450 mg daily. Take along with 150 mg tab (Patient taking differently: Take 300 mg by mouth daily.)   clobetasol  cream (TEMOVATE ) 0.05 % Apply 1 Application topically 2 (two) times daily. Apply to hands until smooth, then stop   Crisaborole  (EUCRISA ) 2 % OINT Apply twice a day to hands prn flares   cyanocobalamin  (VITAMIN B12) 1000 MCG/ML injection INJECT 1 ML INTRAMUSCULARLY  ONCE EVERY MONTH   diclofenac Sodium (VOLTAREN) 1 % GEL Apply 2 g topically daily as needed (pain).   esomeprazole  (NEXIUM ) 40 MG capsule TAKE 1 CAPSULE BY MOUTH TWICE DAILY BEFORE A MEAL   estradiol  (ESTRACE ) 0.1  MG/GM vaginal cream Place 1 Applicatorful vaginally 4 (four) times a week. At bedtime   HYDROcodone -acetaminophen  (NORCO) 10-325 MG tablet Take 1 tablet by mouth in the morning, at noon, in the evening, and at bedtime.   metFORMIN  (GLUCOPHAGE ) 500 MG tablet Take 1 tablet (500 mg total) by mouth 2 (two) times daily with a meal.   methocarbamol  (ROBAXIN ) 500 MG tablet Take 500 mg by mouth 4 (four) times daily.   ondansetron  (ZOFRAN ) 4 MG tablet Take 1 tablet (4 mg total) by mouth every 6 (six) hours as needed for nausea.   oxyCODONE  (OXY IR/ROXICODONE ) 5 MG immediate release tablet Take 1 tablet (5 mg total) by mouth every 4 (four) hours as needed for breakthrough pain or severe pain (pain score 7-10).   rizatriptan (MAXALT) 10 MG tablet Take 10 mg by mouth as needed for migraine. May repeat in 2 hours if needed   sertraline  (ZOLOFT ) 100 MG tablet Take 1.5 tablets (150 mg total) by mouth at bedtime.   topiramate  (TOPAMAX ) 25 MG tablet Take 50 mg by mouth 2 (  two) times daily.   Ubrogepant (UBRELVY) 100 MG TABS Take 100 mg by mouth 2 (two) times daily as needed (migraine).   XTAMPZA  ER 13.5 MG C12A Take 13.5 mg by mouth 2 (two) times daily.   atorvastatin  (LIPITOR) 20 MG tablet Take 1 tablet (20 mg total) by mouth daily.   esomeprazole  (NEXIUM ) 40 MG capsule Take 1 capsule (40 mg total) by mouth 2 (two) times daily before a meal.   [DISCONTINUED] atorvastatin  (LIPITOR) 20 MG tablet Take 1 tablet (20 mg total) by mouth daily.   [DISCONTINUED] docusate sodium  (COLACE) 100 MG capsule Take 1 capsule (100 mg total) by mouth 2 (two) times daily.   [DISCONTINUED] enoxaparin  (LOVENOX ) 40 MG/0.4ML injection Inject 0.4 mLs (40 mg total) into the skin daily for 14 days.   [DISCONTINUED] esomeprazole  (NEXIUM ) 40 MG capsule TAKE 1 CAPSULE BY MOUTH TWICE DAILY BEFORE A MEAL   [DISCONTINUED] estradiol  (VIVELLE -DOT) 0.05 MG/24HR patch Place 1 patch (0.05 mg total) onto the skin 2 (two) times a week.   [DISCONTINUED]  fluconazole  (DIFLUCAN ) 150 MG tablet Take one tablet PO once. If sxs persist, may take one tablet PO 3 days later.   No facility-administered encounter medications on file as of 06/08/2024.     Lab Results  Component Value Date   WBC 8.8 05/14/2024   HGB 10.2 (L) 05/14/2024   HCT 29.9 (L) 05/14/2024   PLT 143 (L) 05/14/2024   GLUCOSE 109 (H) 05/14/2024   CHOL 183 02/17/2024   TRIG 72 02/17/2024   HDL 57 02/17/2024   LDLDIRECT 181.0 06/26/2020   LDLCALC 113 (H) 02/17/2024   ALT 14 04/30/2024   AST 15 04/30/2024   NA 141 05/14/2024   K 3.8 05/14/2024   CL 110 05/14/2024   CREATININE 0.60 05/14/2024   BUN 13 05/14/2024   CO2 27 05/14/2024   TSH 2.630 07/14/2023   INR 1.1 12/03/2019   HGBA1C 5.0 02/17/2024    DG HIP UNILAT WITH PELVIS 2-3 VIEWS LEFT Result Date: 05/13/2024 CLINICAL DATA:  Elective surgery. EXAM: DG HIP (WITH OR WITHOUT PELVIS) 2-3V LEFT COMPARISON:  None Available. FINDINGS: Three fluoroscopic spot views of the pelvis and left hip obtained in the operating room. Images during hip arthroplasty. Previous right hip arthroplasty. Fluoroscopy time 32 seconds. Dose 4.5367 mGy. IMPRESSION: Intraoperative fluoroscopy during left hip arthroplasty. Electronically Signed   By: Andrea Gasman M.D.   On: 05/13/2024 12:27   DG C-Arm 1-60 Min-No Report Result Date: 05/13/2024 Fluoroscopy was utilized by the requesting physician.  No radiographic interpretation.   DG C-Arm 1-60 Min-No Report Result Date: 05/13/2024 Fluoroscopy was utilized by the requesting physician.  No radiographic interpretation.       Assessment & Plan:  Anemia, unspecified type Assessment & Plan: Check cbc today.   Orders: -     CBC with Differential/Platelet  B12 deficiency  Hypercholesterolemia Assessment & Plan: On atorvastatin . Low cholesterol diet and exercise. Follow lipid panel and liver function tests.   Orders: -     Basic metabolic panel with GFR -     Hepatic function  panel  Hyperglycemia Assessment & Plan: Low carb diet. Follow met b and A1c.   Orders: -     Basic metabolic panel with GFR  Generalized abdominal pain Assessment & Plan: Increased abdominal pain.  Started last week, improved and now worse again.  Increased pain. Nausea. Decreased appetite. Has a history of diverticulitis. Does feel similar to previous diverticular pain. Bowels as outlined. Concern given recent  hip surgery, want to confirm etiology of increased pain. Confirm no other infection, abscess, etc. Will check stat CT scan. Further w/up / treatment pending results. Also check cbc, met b and liver panel.    S/P total left hip arthroplasty Assessment & Plan: Recent surgery as outlined. Going to PT. Followed by ortho. With increased abdominal pain, plan CT as outlined.    Left lower quadrant abdominal pain Assessment & Plan: Increased abdominal pain.  Started last week, improved and now worse again.  Increased pain. Nausea. Decreased appetite. Has a history of diverticulitis. Does feel similar to previous diverticular pain. Bowels as outlined. Concern given recent hip surgery, want to confirm etiology of increased pain. Confirm no other infection, abscess, etc. Will check stat CT scan. Further w/up / treatment pending results. Also check cbc, met b and liver panel.   Orders: -     CT ABDOMEN PELVIS W CONTRAST; Future  Other orders -     Atorvastatin  Calcium ; Take 1 tablet (20 mg total) by mouth daily.  Dispense: 90 tablet; Refill: 1 -     Esomeprazole  Magnesium ; Take 1 capsule (40 mg total) by mouth 2 (two) times daily before a meal.  Dispense: 60 capsule; Refill: 2     Allena Hamilton, MD

## 2024-06-09 ENCOUNTER — Ambulatory Visit: Payer: Self-pay | Admitting: Internal Medicine

## 2024-06-09 ENCOUNTER — Ambulatory Visit
Admission: RE | Admit: 2024-06-09 | Discharge: 2024-06-09 | Disposition: A | Discharge status: Home / Self Care | Source: Ambulatory Visit | Attending: Internal Medicine | Admitting: Internal Medicine

## 2024-06-09 DIAGNOSIS — R1032 Left lower quadrant pain: Secondary | ICD-10-CM | POA: Insufficient documentation

## 2024-06-09 DIAGNOSIS — K5710 Diverticulosis of small intestine without perforation or abscess without bleeding: Secondary | ICD-10-CM | POA: Diagnosis not present

## 2024-06-09 LAB — CBC WITH DIFFERENTIAL/PLATELET
Basophils Absolute: 0.1 K/uL (ref 0.0–0.1)
Basophils Relative: 1 % (ref 0.0–3.0)
Eosinophils Absolute: 0.2 K/uL (ref 0.0–0.7)
Eosinophils Relative: 3.4 % (ref 0.0–5.0)
HCT: 37 % (ref 36.0–46.0)
Hemoglobin: 12.6 g/dL (ref 12.0–15.0)
Lymphocytes Relative: 28.8 % (ref 12.0–46.0)
Lymphs Abs: 2 K/uL (ref 0.7–4.0)
MCHC: 33.9 g/dL (ref 30.0–36.0)
MCV: 89 fl (ref 78.0–100.0)
Monocytes Absolute: 0.5 K/uL (ref 0.1–1.0)
Monocytes Relative: 6.8 % (ref 3.0–12.0)
Neutro Abs: 4.2 K/uL (ref 1.4–7.7)
Neutrophils Relative %: 60 % (ref 43.0–77.0)
Platelets: 255 K/uL (ref 150.0–400.0)
RBC: 4.16 Mil/uL (ref 3.87–5.11)
RDW: 13.1 % (ref 11.5–15.5)
WBC: 7 K/uL (ref 4.0–10.5)

## 2024-06-09 LAB — HEPATIC FUNCTION PANEL
ALT: 12 U/L (ref 0–35)
AST: 12 U/L (ref 0–37)
Albumin: 4.3 g/dL (ref 3.5–5.2)
Alkaline Phosphatase: 94 U/L (ref 39–117)
Bilirubin, Direct: 0 mg/dL (ref 0.0–0.3)
Total Bilirubin: 0.3 mg/dL (ref 0.2–1.2)
Total Protein: 6.8 g/dL (ref 6.0–8.3)

## 2024-06-09 LAB — BASIC METABOLIC PANEL WITH GFR
BUN: 19 mg/dL (ref 6–23)
CO2: 31 meq/L (ref 19–32)
Calcium: 9.4 mg/dL (ref 8.4–10.5)
Chloride: 103 meq/L (ref 96–112)
Creatinine, Ser: 0.78 mg/dL (ref 0.40–1.20)
GFR: 88.23 mL/min (ref >60.00)
Glucose, Bld: 82 mg/dL (ref 70–99)
Potassium: 4.3 meq/L (ref 3.5–5.1)
Sodium: 140 meq/L (ref 135–145)

## 2024-06-09 MED ORDER — IOHEXOL 300 MG/ML  SOLN
100.0000 mL | Freq: Once | INTRAMUSCULAR | Status: AC | PRN
  Administered 2024-06-09: 100 mL via INTRAVENOUS

## 2024-06-10 DIAGNOSIS — Z471 Aftercare following joint replacement surgery: Secondary | ICD-10-CM | POA: Diagnosis not present

## 2024-06-10 DIAGNOSIS — R519 Headache, unspecified: Secondary | ICD-10-CM | POA: Diagnosis not present

## 2024-06-10 DIAGNOSIS — E538 Deficiency of other specified B group vitamins: Secondary | ICD-10-CM | POA: Diagnosis not present

## 2024-06-10 DIAGNOSIS — Z87798 Personal history of other (corrected) congenital malformations: Secondary | ICD-10-CM | POA: Diagnosis not present

## 2024-06-10 DIAGNOSIS — I341 Nonrheumatic mitral (valve) prolapse: Secondary | ICD-10-CM | POA: Diagnosis not present

## 2024-06-10 DIAGNOSIS — K5792 Diverticulitis of intestine, part unspecified, without perforation or abscess without bleeding: Secondary | ICD-10-CM | POA: Diagnosis not present

## 2024-06-10 DIAGNOSIS — E7800 Pure hypercholesterolemia, unspecified: Secondary | ICD-10-CM | POA: Diagnosis not present

## 2024-06-10 DIAGNOSIS — M51369 Other intervertebral disc degeneration, lumbar region without mention of lumbar back pain or lower extremity pain: Secondary | ICD-10-CM | POA: Diagnosis not present

## 2024-06-10 DIAGNOSIS — M26609 Unspecified temporomandibular joint disorder, unspecified side: Secondary | ICD-10-CM | POA: Diagnosis not present

## 2024-06-10 DIAGNOSIS — I5031 Acute diastolic (congestive) heart failure: Secondary | ICD-10-CM | POA: Diagnosis not present

## 2024-06-10 DIAGNOSIS — Z87891 Personal history of nicotine dependence: Secondary | ICD-10-CM | POA: Diagnosis not present

## 2024-06-10 DIAGNOSIS — Z8616 Personal history of COVID-19: Secondary | ICD-10-CM | POA: Diagnosis not present

## 2024-06-10 DIAGNOSIS — G8929 Other chronic pain: Secondary | ICD-10-CM | POA: Diagnosis not present

## 2024-06-10 DIAGNOSIS — I700 Atherosclerosis of aorta: Secondary | ICD-10-CM | POA: Diagnosis not present

## 2024-06-10 DIAGNOSIS — K219 Gastro-esophageal reflux disease without esophagitis: Secondary | ICD-10-CM | POA: Diagnosis not present

## 2024-06-10 DIAGNOSIS — K2950 Unspecified chronic gastritis without bleeding: Secondary | ICD-10-CM | POA: Diagnosis not present

## 2024-06-10 DIAGNOSIS — I110 Hypertensive heart disease with heart failure: Secondary | ICD-10-CM | POA: Diagnosis not present

## 2024-06-10 DIAGNOSIS — K589 Irritable bowel syndrome without diarrhea: Secondary | ICD-10-CM | POA: Diagnosis not present

## 2024-06-10 DIAGNOSIS — Z96643 Presence of artificial hip joint, bilateral: Secondary | ICD-10-CM | POA: Diagnosis not present

## 2024-06-10 DIAGNOSIS — I4892 Unspecified atrial flutter: Secondary | ICD-10-CM | POA: Diagnosis not present

## 2024-06-11 ENCOUNTER — Encounter: Payer: Self-pay | Admitting: Internal Medicine

## 2024-06-11 MED ORDER — ONDANSETRON HCL 4 MG PO TABS: 4.0000 mg | ORAL_TABLET | Freq: Three times a day (TID) | ORAL | 0 refills | Status: AC | PRN

## 2024-06-11 NOTE — Telephone Encounter (Signed)
 Rx sent in for zofran . She has seen Dr Maryruth. Please call [facility] and notify she is having persistent nausea and abdominal discomfort. CT unrevealing. Symptoms not improving with medication. Would like appt asap.

## 2024-06-11 NOTE — Telephone Encounter (Signed)
 FYI- Confirmed no new acute or worsening symptoms. Patient states that this is just an update per your request. She is able to eat and drink. Having nausea but using zofran  and one lasts all day. She has 3 pills left. Would like a refill and also says you mentioned GI appt. She did agree if any new acute worsening symptoms, she will be evaluated more emergently.

## 2024-06-14 NOTE — Telephone Encounter (Signed)
 Please call and clarify with her who has her taking it bid.  Also confirm time taking.  If taking at night and if she is supposed to be on bid dosing, then can she move the dose up to take with food.

## 2024-06-15 NOTE — Telephone Encounter (Signed)
 Patient will try taking with food. Surgeon recommended ASA bid x3 weeks after coming off of lovenox . She stops this Saturday.

## 2024-06-18 ENCOUNTER — Ambulatory Visit (INDEPENDENT_AMBULATORY_CARE_PROVIDER_SITE_OTHER): Admitting: Licensed Clinical Social Worker

## 2024-06-18 DIAGNOSIS — F411 Generalized anxiety disorder: Secondary | ICD-10-CM

## 2024-06-18 DIAGNOSIS — F3341 Major depressive disorder, recurrent, in partial remission: Secondary | ICD-10-CM

## 2024-06-18 NOTE — Progress Notes (Signed)
 THERAPIST PROGRESS NOTE  Virtual Visit via Video Note  I connected with Kathleen Everett on 06/18/24 at 8:06am by a video enabled telemedicine application and verified that I am speaking with the correct person using two identifiers.  Location: Patient: Address on file  Provider: Providers Address   I discussed the limitations of evaluation and management by telemedicine and the availability of in person appointments. The patient expressed understanding and agreed to proceed.   I discussed the assessment and treatment plan with the patient. The patient was provided an opportunity to ask questions and all were answered. The patient agreed with the plan and demonstrated an understanding of the instructions.   The patient was advised to call back or seek an in-person evaluation if the symptoms worsen or if the condition fails to improve as anticipated.  I provided 54 minutes of non-face-to-face time during this encounter.   Kathleen KATHEE Husband, LCSW   Session Time: 8:06am-9am  Participation Level: Active  Behavioral Response: CasualAlertEuthymic  Type of Therapy: Individual Therapy  Treatment Goals addressed:  Active     Anxiety     LTG: Kathleen Everett will score less than 5 on the Generalized Anxiety Disorder 7 Scale (GAD-7)      Start:  02/06/24    Expected End:  08/08/24         STG: Kathleen Everett will reduce frequency of avoidant behaviors by 50% as evidenced by self-report in therapy sessions     Start:  02/06/24    Expected End:  08/08/24         Perform psychoeducation regarding anxiety disorders     Start:  02/06/24         Work with patient individually to identify the major components of a recent episode of anxiety: physical symptoms, major thoughts and images, and major behaviors they experienced     Start:  02/06/24         Coping Skills      Start:  02/06/24       Will work with the pt using CBT/DBT techniques to help the pt verbalize  an understanding of the cognitive, physiological, and behavioral components of anxiety and its treatment. This will be done by using worksheets, interactive activities, CBT/ABC thought logs, modeling, homework, role playing and journaling. Will work with pt to learn and implement coping skills that result in a reduction of anxiety and improve daily functioning per pt self-report 3 out of 5 documented sessions.         OP Depression     LTG: Reduce frequency, intensity, and duration of depression symptoms so that daily functioning is improved     Start:  02/06/24    Expected End:  08/08/24         LTG: Increase coping skills to manage depression and improve ability to perform daily activities     Start:  02/06/24    Expected End:  08/08/24         LTG: Improve coping abilities with her chronic pain.      Start:  02/06/24    Expected End:  08/08/24         STG: Not to get so agitated      Start:  02/06/24    Expected End:  08/08/24         Work with Kathleen Everett to identify the major components of a recent episode of depression: physical symptoms, major thoughts and images, and major behaviors they  experienced     Start:  02/06/24         Therapist will educate patient on cognitive distortions and the rationale for treatment of depression     Start:  02/06/24         Kathleen Everett will identify 2 cognitive distortions they are currently using and write reframing statements to replace them     Start:  02/06/24         Coping Skills      Start:  02/06/24       Will work with the pt using CBT/DBT techniques to help the pt verbalize an understanding of the cognitive, physiological, and behavioral components of depression and its treatment. This will be done by using worksheets, interactive activities, CBT/ABC thought logs, modeling, homework, role playing and journaling. Will work with pt to learn and implement coping skills that result in a reduction of  depression and improve daily functioning per pt self-report 3 out of 5 documented sessions.          ProgressTowards Goals: Progressing  Interventions: CBT, Supportive, and Reframing  Summary: Kathleen Everett is a 53 y.o. female who presents with Change in energy/activity; Difficulty Concentrating; Fatigue; Irritability; Sleep (too much or little); Tearfulness; Worthlessness; anxious feelings, and tension. Pt was oriented times 5. Pt was cooperative and engaged. Pt denies SI/HI/AVH.      The patient identifies she is struggling with the lack of control over her children's life choices as they grow up. She expresses concerns about differing family values and changing traditions, and she is managing expectations regarding her children's decisions. 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 During a values exploration exercise, the patient reflected on her values before and after retirement. She expressed a desire to prioritize engaging in activities that interest her and bring her peace at this stage of life.  She has had conversations with her family about love languages and personal needs in order to maintain healthier relationships. However, stressors continue to include financial strain which is making it challenging for her to remain aligned with others. Cln and patient continued to explore ways in which she can address and cope with these stressors.   In the previous session, we discussed her task paralysis. She reported progress by improving her productivity through the use of timers.  For homework, the patient was encouraged to participate in activities that bring her joy and to reflect on her identity outside of her various roles.  Suicidal/Homicidal: Nowithout intent/plan  Therapist Response: Cln utilized active and supportive reflection to assist in  patient feeling safe to process. Cln assessed for current safety, symptoms, and stressors since last session. Reviewed coping skills and celebrated the patient progress in accomplishing tasks. Worked with the patient on clarifying her personal identity outside of her roles through a values exercise.   Plan: Return again in 2 weeks.  Diagnosis: Generalized anxiety disorder  MDD (major depressive disorder), recurrent, in partial remission (HCC)   Collaboration of Care: : [facility] psychiatrist can access notes and cln. Will review psychiatrists' notes. Check in with the patient and will see LCSW per availability. Patient agreed with treatment recommendations.   Patient/Guardian was advised Release of Information must be obtained prior to any record release in order to collaborate their care with an outside provider. Patient/Guardian was advised if they have not already done so to contact the  registration department to sign all necessary forms in order for us  to release information regarding their care.   Consent: Patient/Guardian gives verbal consent for treatment and assignment of benefits for services provided during this visit. Patient/Guardian expressed understanding and agreed to proceed.   Kathleen KATHEE Husband, LCSW 06/18/2024
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
## 2024-06-23 ENCOUNTER — Encounter: Payer: Self-pay | Admitting: Psychiatry

## 2024-06-23 ENCOUNTER — Telehealth: Admitting: Psychiatry

## 2024-06-23 DIAGNOSIS — F411 Generalized anxiety disorder: Secondary | ICD-10-CM | POA: Diagnosis not present

## 2024-06-23 DIAGNOSIS — F3341 Major depressive disorder, recurrent, in partial remission: Secondary | ICD-10-CM

## 2024-06-23 MED ORDER — SERTRALINE HCL 100 MG PO TABS: 200.0000 mg | ORAL_TABLET | Freq: Every day | ORAL | 0 refills | Status: DC

## 2024-06-23 NOTE — Patient Instructions (Signed)
 Continue bupropion  300 mg daily  Increase sertraline  200 mg at night Next appointment: 9/24 at 3 30

## 2024-06-27 NOTE — Progress Notes (Unsigned)
 SUBJECTIVE: Discussed the use of AI scribe software for clinical note transcription with the patient, who gave verbal consent to proceed.  Chief Complaint: Obesity  Interim History: She is down 7 lbs since her last visit.  Down 20 lbs overall TBW loss of 9.1%  Kathleen Everett is here to discuss her progress with her obesity treatment plan. She is on the Category 2 Plan and keeping a food journal and adhering to recommended goals of 1100-1300 calories and 80+ grams of  protein and states she is following her eating plan approximately 50 % of the time. She states she is not exercising  as she underwent Left Total hip replacement surgery 05/13/24 and is still recovering.    Kathleen Everett Sacora Hawbaker is a 53 year old female who presents for follow-up after left total hip arthroplasty.  She underwent left total hip arthroplasty on May 13, 2024. Her recovery has been generally positive, although she experiences knee pain and increased discomfort after physical activity, such as going up and down steps at her camper. She has taken stronger pain medication as needed but is no longer using Percocet. She has follow up with orthopedics soon.   Post-surgery, she has experienced some gastrointestinal issues, including abdominal pain initially attributed to diverticulitis. CT scan was normal except for some abdominal aortic calcification. Her symptoms improved after discontinuing aspirin. She uses Zofran  for nausea and has tried castor oil for constipation, which she attributes to pain medication use. Constipation is improved.   She is on metformin  for prediabetes and reports variable appetite post-surgery. She has been monitoring her weight and is pleased with recent weight loss. She is also on atorvastatin  for hyperlipidemia and topiramate  for migraines, which she finds helpful for managing cravings and sleep. She has a history of vitamin D  deficiency and takes over-the-counter supplements.  She reports  consistent fluid intake, particularly enjoying cold water. She has a history of insulin  resistance with a recent level of 14. She has previously been on Ozempic . OBJECTIVE: Visit Diagnoses: Problem List Items Addressed This Visit     Vitamin D  deficiency   Obesity, Beginning BMI 31.9   Relevant Medications   semaglutide -weight management (WEGOVY ) 0.25 MG/0.5ML SOAJ SQ injection   metFORMIN  (GLUCOPHAGE ) 500 MG tablet   Insulin  resistance   Relevant Medications   semaglutide -weight management (WEGOVY ) 0.25 MG/0.5ML SOAJ SQ injection   metFORMIN  (GLUCOPHAGE ) 500 MG tablet   Other Visit Diagnoses       Aortic atherosclerosis (HCC)    -  Primary   Relevant Medications   semaglutide -weight management (WEGOVY ) 0.25 MG/0.5ML SOAJ SQ injection     Mixed hyperlipidemia       Relevant Medications   semaglutide -weight management (WEGOVY ) 0.25 MG/0.5ML SOAJ SQ injection     BMI 29.0-29.9,adult Current BMI 29.1          Insulin  resistance Insulin  resistance with a level of 14, within normal range but not optimized. Preferably, insulin  levels should be under 5 for optimal health. - Refill metformin  prescription - Submit prior authorization for Wegovy , using aortic atherosclerosis, mixed hyperlipidemia, and insulin  resistance as indications - Advise to hold metformin  if Wegovy  is started and causes nausea, then reintroduce metformin  gradually Meds ordered this encounter  Medications   semaglutide -weight management (WEGOVY ) 0.25 MG/0.5ML SOAJ SQ injection    Sig: Inject 0.25 mg into the skin once a week.    Dispense:  2 mL    Refill:  0   metFORMIN  (GLUCOPHAGE ) 500 MG tablet  Sig: Take 1 tablet (500 mg total) by mouth 2 (two) times daily with a meal.    Dispense:  60 tablet    Refill:  1    Abdominal aortic atherosclerosis Aortic calcifications noted on imaging. No immediate intervention required, but weight loss and cholesterol management are advised to mitigate progression. Ct abd  06/09/24 IMPRESSION: 1. No acute findings in the abdomen or pelvis. Specifically, no findings to explain the patient's history of left lower quadrant pain. 2. Status post bilateral hip replacement. 3.  Aortic Atherosclerosis (ICD10-I70.0).   - Continue atorvastatin  for cholesterol management - Start Wegovy  for hyperlipidemia with documented atherosclerosis - Promote healthy weight loss through diet and exercise  Mixed hyperlipidemia Managed with atorvastatin . Discussion about the potential use of Wegovy  to aid in weight loss and improve lipid profile, pending insurance approval. - Continue atorvastatin  - Submit prior authorization for Wegovy   Left total hip arthroplasty, post-operative pain Post-operative pain persists six weeks after surgery, possibly due to increased activity. Pain management with stronger medications was required recently, but overall recovery is progressing. - Monitor pain levels and adjust activity as tolerated - Follow up with orthopedics as directed.   Migraine Managed with topiramate , which also aids in weight loss and reduces cravings. - Continue topiramate   Vitamin D  deficiency Managed with over-the-counter vitamin D  supplementation. Dosage details are unclear. - Continue over-the-counter vitamin D  supplementation - Bring vitamin D  supplement details to next appointment  Vitals Temp: 98.6 F (37 C) BP: 105/68 Pulse Rate: 74 SpO2: 97 %   Anthropometric Measurements Height: 5' 9.5 (1.765 m) Weight: 199 lb (90.3 kg) BMI (Calculated): 28.98 Weight at Last Visit: 206 lb Weight Lost Since Last Visit: 7 lb Weight Gained Since Last Visit: 0 Starting Weight: 219 lb Total Weight Loss (lbs): 20 lb (9.072 kg)   Body Composition  Body Fat %: 40.3 % Fat Mass (lbs): 80.6 lbs Muscle Mass (lbs): 113.2 lbs Total Body Water (lbs): 77.4 lbs Visceral Fat Rating : 9   Other Clinical Data Fasting: no Labs: no Today's Visit #: 11 Starting Date:  07/14/23 Comments: height re ck'd today     ASSESSMENT AND PLAN:  Diet: Shatasia is currently in the action stage of change. As such, her goal is to continue with weight loss efforts. She has agreed to Category 2 Plan and keeping a food journal and adhering to recommended goals of 1100-1300 calories and 80+ grams of protein.  Exercise: Rhyli has been instructed Exercise as allowed by orthopedics following left THR for weight loss and overall health benefits.   Behavior Modification:  We discussed the following Behavioral Modification Strategies today: increasing lean protein intake, decreasing simple carbohydrates, increasing vegetables, increase H2O intake, increase high fiber foods, no skipping meals, meal planning and cooking strategies, avoiding temptations, and planning for success. We discussed various medication options to help Charon with her weight loss efforts and we both agreed to start Wegovy  for hyperlipidemia and aortic atherosclerosis.  Return in about 6 weeks (around 08/09/2024).SABRA She was informed of the importance of frequent follow up visits to maximize her success with intensive lifestyle modifications for her multiple health conditions.  Attestation Statements:   Reviewed by clinician on day of visit: allergies, medications, problem list, medical history, surgical history, family history, social history, and previous encounter notes.   Time spent on visit including pre-visit chart review and post-visit care and charting was 31 minutes.    Kebra Lowrimore, PA-C

## 2024-06-28 ENCOUNTER — Encounter (INDEPENDENT_AMBULATORY_CARE_PROVIDER_SITE_OTHER): Payer: Self-pay | Admitting: Physician Assistant

## 2024-06-28 ENCOUNTER — Ambulatory Visit (INDEPENDENT_AMBULATORY_CARE_PROVIDER_SITE_OTHER): Admitting: Physician Assistant

## 2024-06-28 VITALS — BP 105/68 | HR 74 | Temp 98.6°F | Ht 69.5 in | Wt 199.0 lb

## 2024-06-28 DIAGNOSIS — G8918 Other acute postprocedural pain: Secondary | ICD-10-CM

## 2024-06-28 DIAGNOSIS — E559 Vitamin D deficiency, unspecified: Secondary | ICD-10-CM

## 2024-06-28 DIAGNOSIS — E88819 Insulin resistance, unspecified: Secondary | ICD-10-CM

## 2024-06-28 DIAGNOSIS — E782 Mixed hyperlipidemia: Secondary | ICD-10-CM | POA: Diagnosis not present

## 2024-06-28 DIAGNOSIS — Z6829 Body mass index (BMI) 29.0-29.9, adult: Secondary | ICD-10-CM

## 2024-06-28 DIAGNOSIS — I700 Atherosclerosis of aorta: Secondary | ICD-10-CM | POA: Diagnosis not present

## 2024-06-28 DIAGNOSIS — E669 Obesity, unspecified: Secondary | ICD-10-CM

## 2024-06-28 DIAGNOSIS — G43909 Migraine, unspecified, not intractable, without status migrainosus: Secondary | ICD-10-CM | POA: Diagnosis not present

## 2024-06-28 DIAGNOSIS — Z96642 Presence of left artificial hip joint: Secondary | ICD-10-CM | POA: Diagnosis not present

## 2024-06-28 MED ORDER — WEGOVY 0.25 MG/0.5ML ~~LOC~~ SOAJ
0.2500 mg | SUBCUTANEOUS | 0 refills | Status: DC
Start: 2024-06-28 — End: 2024-09-07

## 2024-06-28 MED ORDER — METFORMIN HCL 500 MG PO TABS: 500.0000 mg | ORAL_TABLET | Freq: Two times a day (BID) | ORAL | 1 refills | Status: DC

## 2024-07-05 ENCOUNTER — Encounter: Payer: Self-pay | Admitting: Licensed Clinical Social Worker

## 2024-07-05 ENCOUNTER — Ambulatory Visit (INDEPENDENT_AMBULATORY_CARE_PROVIDER_SITE_OTHER): Admitting: Licensed Clinical Social Worker

## 2024-07-05 DIAGNOSIS — F3341 Major depressive disorder, recurrent, in partial remission: Secondary | ICD-10-CM

## 2024-07-05 DIAGNOSIS — F411 Generalized anxiety disorder: Secondary | ICD-10-CM | POA: Diagnosis not present

## 2024-07-05 NOTE — Progress Notes (Signed)
 THERAPIST PROGRESS NOTE  Virtual Visit via Video Note  I connected with Kathleen Everett on 07/05/24 at 8:02am by a video enabled telemedicine application and verified that I am speaking with the correct person using two identifiers.  Location: Patient: Address on file  Provider: [facility]   I discussed the limitations of evaluation and management by telemedicine and the availability of in person appointments. The patient expressed understanding and agreed to proceed.   I discussed the assessment and treatment plan with the patient. The patient was provided an opportunity to ask questions and all were answered. The patient agreed with the plan and demonstrated an understanding of the instructions.   The patient was advised to call back or seek an in-person evaluation if the symptoms worsen or if the condition fails to improve as anticipated.  I provided 52 minutes of non-face-to-face time during this encounter.   Kathleen KATHEE Husband, LCSW   Session Time: 8:02am-8:52am  Participation Level: Active  Behavioral Response: CasualAlertEuthymic  Type of Therapy: Individual Therapy  Treatment Goals addressed:  Active     Anxiety     LTG: Kathleen Everett will score less than 5 on the Generalized Anxiety Disorder 7 Scale (GAD-7)      Start:  02/06/24    Expected End:  08/08/24         STG: Kathleen Everett will reduce frequency of avoidant behaviors by 50% as evidenced by self-report in therapy sessions     Start:  02/06/24    Expected End:  08/08/24         Perform psychoeducation regarding anxiety disorders     Start:  02/06/24         Work with patient individually to identify the major components of a recent episode of anxiety: physical symptoms, major thoughts and images, and major behaviors they experienced     Start:  02/06/24         Coping Skills      Start:  02/06/24       Will work with the pt using CBT/DBT techniques to help the pt verbalize  an understanding of the cognitive, physiological, and behavioral components of anxiety and its treatment. This will be done by using worksheets, interactive activities, CBT/ABC thought logs, modeling, homework, role playing and journaling. Will work with pt to learn and implement coping skills that result in a reduction of anxiety and improve daily functioning per pt self-report 3 out of 5 documented sessions.         OP Depression     LTG: Reduce frequency, intensity, and duration of depression symptoms so that daily functioning is improved     Start:  02/06/24    Expected End:  08/08/24         LTG: Increase coping skills to manage depression and improve ability to perform daily activities     Start:  02/06/24    Expected End:  08/08/24         LTG: Improve coping abilities with her chronic pain.      Start:  02/06/24    Expected End:  08/08/24         STG: Not to get so agitated      Start:  02/06/24    Expected End:  08/08/24         Work with Kathleen Everett to identify the major components of a recent episode of depression: physical symptoms, major thoughts and images, and major behaviors they experienced  Start:  02/06/24         Therapist will educate patient on cognitive distortions and the rationale for treatment of depression     Start:  02/06/24         Kathleen Everett will identify 2 cognitive distortions they are currently using and write reframing statements to replace them     Start:  02/06/24         Coping Skills      Start:  02/06/24       Will work with the pt using CBT/DBT techniques to help the pt verbalize an understanding of the cognitive, physiological, and behavioral components of depression and its treatment. This will be done by using worksheets, interactive activities, CBT/ABC thought logs, modeling, homework, role playing and journaling. Will work with pt to learn and implement coping skills that result in a reduction of  depression and improve daily functioning per pt self-report 3 out of 5 documented sessions.          ProgressTowards Goals: Progressing  Interventions: Solution Focused and Supportive, assertive communication  Summary: Kathleen Everett is a 53 y.o. female who presents with Change in energy/activity; Difficulty Concentrating; Fatigue; Irritability; Sleep (too much or little); Tearfulness; Worthlessness; anxious feelings, and tension. Pt was oriented times 5. Pt was cooperative and engaged. Pt denies SI/HI/AVH.      The patient attended the session reporting a week marked by persistent fatigue, reduced social interactions, and a heightened sense of depression. She identified recent stressors, including a surgical procedure and restricted physical abilities.  The patient continued to use the therapeutic session to process her connections within her social circle and her desire to feel seen by others. Together, the clinician and patient worked to challenge the negative belief of I am not good enough by reflecting on love languages and exploring ways for her to build confidence in her personal relationships. Through an exercise, the patient discovered that her primary love languages are acts of service, words of affirmation, and quality time. The clinician and patient collaborated to identify strategies the patient could use to foster these love languages within her marriage. They also explored the patient's understanding of her partner's needs and discussed how both partners could feel more fulfilled in their relationship. For homework, she agreed to introduce this activity to her Everett and practice these strategies together.    Suicidal/Homicidal: Nowith intent/plan  Therapist Response: Cln utilized active and supportive reflection to assist in patient feeling safe to process. Cln assessed for current safety, symptoms, and stressors since last session.  Educated patient on love languages  and explored ways in which patient can utilize assertive communication to communicate needs within her relationship.   Plan: Return again in 2 weeks.  Diagnosis: Generalized anxiety disorder  MDD (major depressive disorder), recurrent, in partial remission (HCC)   Collaboration of Care: AEB psychiatrist can access notes and cln. Will review psychiatrists' notes. Check in with the patient and will see LCSW per availability. Patient agreed with treatment recommendations.   Patient/Guardian was advised Release of Information must be obtained prior to any record release in order to collaborate their care with an outside provider. Patient/Guardian was advised if they have not already done so to contact the registration department to sign all necessary forms in order for us  to release information regarding their care.   Consent: Patient/Guardian gives verbal consent for treatment and assignment of benefits for services provided during this visit. Patient/Guardian expressed understanding and agreed to proceed.  Kathleen KATHEE Husband, LCSW 07/05/2024

## 2024-07-06 DIAGNOSIS — Z96642 Presence of left artificial hip joint: Secondary | ICD-10-CM | POA: Diagnosis not present

## 2024-07-07 DIAGNOSIS — H04123 Dry eye syndrome of bilateral lacrimal glands: Secondary | ICD-10-CM | POA: Diagnosis not present

## 2024-07-13 DIAGNOSIS — Z79899 Other long term (current) drug therapy: Secondary | ICD-10-CM | POA: Diagnosis not present

## 2024-07-13 DIAGNOSIS — G2581 Restless legs syndrome: Secondary | ICD-10-CM | POA: Diagnosis not present

## 2024-07-13 DIAGNOSIS — M5412 Radiculopathy, cervical region: Secondary | ICD-10-CM | POA: Diagnosis not present

## 2024-07-13 DIAGNOSIS — Z5181 Encounter for therapeutic drug level monitoring: Secondary | ICD-10-CM | POA: Diagnosis not present

## 2024-07-13 DIAGNOSIS — M51379 Other intervertebral disc degeneration, lumbosacral region without mention of lumbar back pain or lower extremity pain: Secondary | ICD-10-CM | POA: Diagnosis not present

## 2024-07-13 DIAGNOSIS — M160 Bilateral primary osteoarthritis of hip: Secondary | ICD-10-CM | POA: Diagnosis not present

## 2024-07-13 DIAGNOSIS — G894 Chronic pain syndrome: Secondary | ICD-10-CM | POA: Diagnosis not present

## 2024-07-13 DIAGNOSIS — M47816 Spondylosis without myelopathy or radiculopathy, lumbar region: Secondary | ICD-10-CM | POA: Diagnosis not present

## 2024-07-20 ENCOUNTER — Ambulatory Visit (INDEPENDENT_AMBULATORY_CARE_PROVIDER_SITE_OTHER): Admitting: Licensed Clinical Social Worker

## 2024-07-20 DIAGNOSIS — F411 Generalized anxiety disorder: Secondary | ICD-10-CM | POA: Diagnosis not present

## 2024-07-20 DIAGNOSIS — F3341 Major depressive disorder, recurrent, in partial remission: Secondary | ICD-10-CM

## 2024-07-20 NOTE — Progress Notes (Signed)
 THERAPIST PROGRESS NOTE  Virtual Visit via Video Note  I connected with Kathleen Everett on 07/20/24 at  1:00 PM EDT by a video enabled telemedicine application and verified that I am speaking with the correct person using two identifiers.  Location: Patient: Address on file Provider: [facility]   I discussed the limitations of evaluation and management by telemedicine and the availability of in person appointments. The patient expressed understanding and agreed to proceed.  I discussed the assessment and treatment plan with the patient. The patient was provided an opportunity to ask questions and all were answered. The patient agreed with the plan and demonstrated an understanding of the instructions.   The patient was advised to call back or seek an in-person evaluation if the symptoms worsen or if the condition fails to improve as anticipated.  I provided 53 minutes of non-face-to-face time during this encounter.  [name] Husband, LCSW  Session Time: 1-1:53pm  Participation Level: Active  Behavioral Response: CasualAlertEuthymic  Type of Therapy: Individual Therapy  Treatment Goals addressed:  Active     Anxiety     LTG: [name] will score less than 5 on the Generalized Anxiety Disorder 7 Scale (GAD-7)      Start:  02/06/24    Expected End:  08/08/24         STG: [name] will reduce frequency of avoidant behaviors by 50% as evidenced by self-report in therapy sessions     Start:  02/06/24    Expected End:  08/08/24         Perform psychoeducation regarding anxiety disorders     Start:  02/06/24         Work with patient individually to identify the major components of a recent episode of anxiety: physical symptoms, major thoughts and images, and major behaviors they experienced     Start:  02/06/24         Coping Skills      Start:  02/06/24       Will work with the pt using CBT/DBT techniques to help the pt verbalize  an understanding of the cognitive, physiological, and behavioral components of anxiety and its treatment. This will be done by using worksheets, interactive activities, CBT/ABC thought logs, modeling, homework, role playing and journaling. Will work with pt to learn and implement coping skills that result in a reduction of anxiety and improve daily functioning per pt self-report 3 out of 5 documented sessions.         OP Depression     LTG: Reduce frequency, intensity, and duration of depression symptoms so that daily functioning is improved     Start:  02/06/24    Expected End:  08/08/24         LTG: Increase coping skills to manage depression and improve ability to perform daily activities     Start:  02/06/24    Expected End:  08/08/24         LTG: Improve coping abilities with her chronic pain.      Start:  02/06/24    Expected End:  08/08/24         STG: Not to get so agitated      Start:  02/06/24    Expected End:  08/08/24         Work with [name] to identify the major components of a recent episode of depression: physical symptoms, major thoughts and images, and major behaviors they experienced  Start:  02/06/24         Therapist will educate patient on cognitive distortions and the rationale for treatment of depression     Start:  02/06/24         [name] will identify 2 cognitive distortions they are currently using and write reframing statements to replace them     Start:  02/06/24         Coping Skills      Start:  02/06/24       Will work with the pt using CBT/DBT techniques to help the pt verbalize an understanding of the cognitive, physiological, and behavioral components of depression and its treatment. This will be done by using worksheets, interactive activities, CBT/ABC thought logs, modeling, homework, role playing and journaling. Will work with pt to learn and implement coping skills that result in a reduction of  depression and improve daily functioning per pt self-report 3 out of 5 documented sessions.          ProgressTowards Goals: Progressing  Interventions: CBT, Supportive, and Reframing  Summary: [name] is a 53 y.o. female who presents with Change in energy/activity; Difficulty Concentrating; Fatigue; Irritability; Sleep (too much or little); Tearfulness; Worthlessness; anxious feelings, and tension. Pt was oriented times 5. Pt was cooperative and engaged. Pt denies SI/HI/AVH.      The patient reports feelings of irritability due to being off her medication for over a month. She often believes she no longer needs her medications, stating, I want to be normal. The patient expressed a desire to begin her medication regimen again.   In terms of grief, she mentioned, I will never be as I used to be due to physical disabilities. She is working on accepting the changes to her abilities and her roles within the family, saying, I don't like who I've become. She feels that she is no longer needed and is not socializing as much.  Worked with clinician to challenge this perspective and work towards Special educational needs teacher.  I educated the patient on safety behaviors that reinforce her tendencies to isolate herself at home. Explored ways she can redirect herself when she finds herself critquing others. Explored ways in which the patient can begin to catch these behaviors and reflect on there triggering events.   Suicidal/Homicidal: Nowithout intent/plan  Therapist Response: Cln utilized active and supportive reflection to assist in patient feeling safe to process. Cln assessed for current safety, symptoms, and stressors since last session.  Clinician worked with patient on challenging negative perceptions of herself as well as her inability to reach perfectionism.  Clinician worked with patient on identifying behaviors that she would like to change and ways in which she can go about catching her  unhelpful cognitions.  Clinician also encouraged patient to begin her medication regimen per medical guidance by her providers.  Plan: Return again in 2 weeks.  Diagnosis:Generalized anxiety disorder  MDD (major depressive disorder), recurrent, in partial remission (HCC)   Collaboration of Care: AEB psychiatrist can access notes and cln. Will review psychiatrists' notes. Check in with the patient and will see LCSW per availability. Patient agreed with treatment recommendations.   Patient/Guardian was advised Release of Information must be obtained prior to any record release in order to collaborate their care with an outside provider. Patient/Guardian was advised if they have not already done so to contact the registration department to sign all necessary forms in order for us  to release information regarding their care.   Consent:  Patient/Guardian gives verbal consent for treatment and assignment of benefits for services provided during this visit. Patient/Guardian expressed understanding and agreed to proceed.   [name] Husband, LCSW 07/20/2024

## 2024-08-05 NOTE — Progress Notes (Unsigned)
   THERAPIST PROGRESS NOTE  Progress Review  Virtual Visit via Video Note  I connected with Kathleen Everett on 08/05/24 at  8:00 AM EDT by a video enabled telemedicine application and verified that I am speaking with the correct person using two identifiers.  Location: Patient: Address on file  Provider: Providers Address   I discussed the limitations of evaluation and management by telemedicine and the availability of in person appointments. The patient expressed understanding and agreed to proceed.   I discussed the assessment and treatment plan with the patient. The patient was provided an opportunity to ask questions and all were answered. The patient agreed with the plan and demonstrated an understanding of the instructions.   The patient was advised to call back or seek an in-person evaluation if the symptoms worsen or if the condition fails to improve as anticipated.  I provided *** minutes of non-face-to-face time during this encounter.   [name] Husband, LCSW   Session Time: ***  Participation Level: Active  Behavioral Response: CasualAlertEuthymic  Type of Therapy: Individual Therapy  Treatment Goals addressed: ***  ProgressTowards Goals: Progressing  Interventions: {CHL AMB BH Type of Intervention:21022753}  Summary: Kathleen Everett is a 53 y.o. female who presents with Change in energy/activity; Difficulty Concentrating; Fatigue; Irritability; Sleep (too much or little); Tearfulness; Worthlessness; anxious feelings, and tension. Pt was oriented times 5. Pt was cooperative and engaged. Pt denies SI/HI/AVH.   Cln utilized the first half of session to review patients progress. See progress notes documented above. The patient reports***  The clinician readministered the PHQ-9 and GAD-7 assessments. The patient's anxiety scores ***creased from 5 to ***, and depression scores also ***creased from 15 to ***. The patient shares***    Suicidal/Homicidal:  Nowithout intent/plan  Therapist Response: Cln utilized active and supportive reflection to assist in patient feeling safe to process. Cln assessed for current safety, symptoms, and stressors since last session.   Plan: Return again in 2 weeks.  Diagnosis: Generalized anxiety disorder  MDD (major depressive disorder), recurrent, in partial remission (HCC)   Collaboration of Care: AEB psychiatrist can access notes and cln. Will review psychiatrists' notes. Check in with the patient and will see LCSW per availability. Patient agreed with treatment recommendations.   Patient/Guardian was advised Release of Information must be obtained prior to any record release in order to collaborate their care with an outside provider. Patient/Guardian was advised if they have not already done so to contact the registration department to sign all necessary forms in order for us  to release information regarding their care.   Consent: Patient/Guardian gives verbal consent for treatment and assignment of benefits for services provided during this visit. Patient/Guardian expressed understanding and agreed to proceed.   [name] Husband, LCSW 08/05/2024

## 2024-08-06 ENCOUNTER — Ambulatory Visit: Admitting: Licensed Clinical Social Worker

## 2024-08-06 ENCOUNTER — Encounter (INDEPENDENT_AMBULATORY_CARE_PROVIDER_SITE_OTHER): Payer: Self-pay | Admitting: Family Medicine

## 2024-08-08 NOTE — Progress Notes (Deleted)
   SUBJECTIVE: Discussed the use of AI scribe software for clinical note transcription with the patient, who gave verbal consent to proceed.  Chief Complaint: Obesity  Interim History: ***  Kathleen Everett is here to discuss her progress with her obesity treatment plan. She is on the {HWW Weight Loss Plan:210964005} and states she {CHL AMB IS/IS NOT:210130109} following her eating plan approximately *** % of the time. She states she {CHL AMB IS/IS NOT:210130109} exercising *** minutes *** times per week.   OBJECTIVE: Visit Diagnoses: Problem List Items Addressed This Visit     Vitamin D  deficiency   Obesity, Beginning BMI 31.9   Insulin  resistance   S/P total left hip arthroplasty   Other Visit Diagnoses       Aortic atherosclerosis (HCC)    -  Primary     Mixed hyperlipidemia           No data recorded No data recorded No data recorded No data recorded   ASSESSMENT AND PLAN:  Diet: Kathleen Everett {CHL AMB IS/IS NOT:210130109} currently in the action stage of change. As such, her goal is to {HWW Weight Loss Efforts:210964006}. She {HAS HAS WNU:81165} agreed to {HWW Weight Loss Plan:210964005}.  Exercise: Kathleen Everett has been instructed {HWW Exercise:210964007} for weight loss and overall health benefits.   Behavior Modification:  We discussed the following Behavioral Modification Strategies today: {HWW Behavior Modification:210964008}. We discussed various medication options to help Kathleen Everett with her weight loss efforts and we both agreed to ***.  No follow-ups on file.SABRA She was informed of the importance of frequent follow up visits to maximize her success with intensive lifestyle modifications for her multiple health conditions.  Attestation Statements:   Reviewed by clinician on day of visit: allergies, medications, problem list, medical history, surgical history, family history, social history, and previous encounter notes.   Time spent on visit including pre-visit chart review and  post-visit care and charting was *** minutes.    Donalee Gaumond, PA-C
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
## 2024-08-09 ENCOUNTER — Ambulatory Visit (INDEPENDENT_AMBULATORY_CARE_PROVIDER_SITE_OTHER): Admitting: Physician Assistant

## 2024-08-09 DIAGNOSIS — E782 Mixed hyperlipidemia: Secondary | ICD-10-CM

## 2024-08-09 DIAGNOSIS — I700 Atherosclerosis of aorta: Secondary | ICD-10-CM

## 2024-08-09 DIAGNOSIS — E88819 Insulin resistance, unspecified: Secondary | ICD-10-CM

## 2024-08-09 DIAGNOSIS — Z96642 Presence of left artificial hip joint: Secondary | ICD-10-CM

## 2024-08-09 DIAGNOSIS — E669 Obesity, unspecified: Secondary | ICD-10-CM

## 2024-08-09 DIAGNOSIS — E559 Vitamin D deficiency, unspecified: Secondary | ICD-10-CM

## 2024-08-11 ENCOUNTER — Telehealth (INDEPENDENT_AMBULATORY_CARE_PROVIDER_SITE_OTHER): Admitting: Psychiatry

## 2024-08-11 ENCOUNTER — Encounter: Payer: Self-pay | Admitting: Psychiatry

## 2024-08-11 DIAGNOSIS — F330 Major depressive disorder, recurrent, mild: Secondary | ICD-10-CM | POA: Diagnosis not present

## 2024-08-11 DIAGNOSIS — F411 Generalized anxiety disorder: Secondary | ICD-10-CM | POA: Diagnosis not present

## 2024-08-11 MED ORDER — BUPROPION HCL ER (XL) 300 MG PO TB24: 300.0000 mg | ORAL_TABLET | Freq: Every day | ORAL | 1 refills | Status: AC

## 2024-08-11 NOTE — Patient Instructions (Signed)
 Continue bupropion  300 mg daily  Decrease sertraline  150 mg daily  Next appointment: 11/14 at 9 am

## 2024-08-13 ENCOUNTER — Encounter: Payer: Self-pay | Admitting: Licensed Clinical Social Worker

## 2024-08-16 ENCOUNTER — Ambulatory Visit (INDEPENDENT_AMBULATORY_CARE_PROVIDER_SITE_OTHER): Admitting: Licensed Clinical Social Worker

## 2024-08-16 DIAGNOSIS — F330 Major depressive disorder, recurrent, mild: Secondary | ICD-10-CM | POA: Diagnosis not present

## 2024-08-16 DIAGNOSIS — F411 Generalized anxiety disorder: Secondary | ICD-10-CM

## 2024-08-16 NOTE — Progress Notes (Signed)
 THERAPIST PROGRESS NOTE  Progress Review  Virtual Visit via Video Note  I connected with Kathleen Everett on 08/16/24 at  9:00 AM EDT by a video enabled telemedicine application and verified that I am speaking with the correct person using two identifiers.  Location: Patient: Address on file  Provider: Providers Address   I discussed the limitations of evaluation and management by telemedicine and the availability of in person appointments. The patient expressed understanding and agreed to proceed.   I discussed the assessment and treatment plan with the patient. The patient was provided an opportunity to ask questions and all were answered. The patient agreed with the plan and demonstrated an understanding of the instructions.   The patient was advised to call back or seek an in-person evaluation if the symptoms worsen or if the condition fails to improve as anticipated.  I provided 48 minutes of non-face-to-face time during this encounter.   Kathleen Everett Husband, LCSW   Session Time: 9-9:48am  Participation Level: Active  Behavioral Response: CasualAlertEuthymic  Type of Therapy: Individual Therapy  Treatment Goals addressed:   Active     Anxiety     LTG: Tamecia Mcdougald will score less than 5 on the Generalized Anxiety Disorder 7 Scale (GAD-7)  (Not Progressing)     Start:  02/06/24    Expected End:  11/15/24       Goal Note     08/16/24: Reports she is anxious about upcoming surgery.          STG: Kynzlee Hucker will reduce frequency of avoidant behaviors by 50% as evidenced by self-report in therapy sessions (Progressing)     Start:  02/06/24    Expected End:  11/15/24       Goal Note     08/16/24: Reports she is back to going to church and having girls weekends.          Perform psychoeducation regarding anxiety disorders     Start:  02/06/24         Work with patient individually to identify the major components of a recent episode of  anxiety: physical symptoms, major thoughts and images, and major behaviors they experienced     Start:  02/06/24         Coping Skills      Start:  02/06/24       Will work with the pt using CBT/DBT techniques to help the pt verbalize an understanding of the cognitive, physiological, and behavioral components of anxiety and its treatment. This will be done by using worksheets, interactive activities, CBT/ABC thought logs, modeling, homework, role playing and journaling. Will work with pt to learn and implement coping skills that result in a reduction of anxiety and improve daily functioning per pt self-report 3 out of 5 documented sessions.         OP Depression     LTG: Reduce frequency, intensity, and duration of depression symptoms so that daily functioning is improved (Not Progressing)     Start:  02/06/24    Expected End:  11/15/24       Goal Note     08/16/24: Reports she feels like she has to wear a mask at home. Often internalizes blame when others don't react in conversation as she expected. Cln encouraged patient to consider other peoples perceptions and what they may dealing before applying blame to herself.          LTG: Increase coping skills to manage depression  and improve ability to perform daily activities (Progressing)     Start:  02/06/24    Expected End:  11/15/24       Goal Note     08/16/24: Reports successful use of breathing and bilateral tapping.          LTG: Improve coping abilities with her chronic pain.  (Not Progressing)     Start:  02/06/24    Expected End:  11/15/24       Goal Note     08/16/24: Reports no improvement. Shares it is difficult to be present with her chronic pain.          STG: Not to get so agitated  (Not Progressing)     Start:  02/06/24    Expected End:  11/15/24       Goal Note     08/16/24: Reports she tried to bite her tongue but she often has urges to talk back to others.Shares she jokes a lot and feels  misunderstood.         Work with [name] to identify the major components of a recent episode of depression: physical symptoms, major thoughts and images, and major behaviors they experienced     Start:  02/06/24         Therapist will educate patient on cognitive distortions and the rationale for treatment of depression     Start:  02/06/24         Jonette Wassel will identify 2 cognitive distortions they are currently using and write reframing statements to replace them     Start:  02/06/24         Coping Skills      Start:  02/06/24       Will work with the pt using CBT/DBT techniques to help the pt verbalize an understanding of the cognitive, physiological, and behavioral components of depression and its treatment. This will be done by using worksheets, interactive activities, CBT/ABC thought logs, modeling, homework, role playing and journaling. Will work with pt to learn and implement coping skills that result in a reduction of depression and improve daily functioning per pt self-report 3 out of 5 documented sessions.         ProgressTowards Goals: Progressing  Interventions: Strength-based and Supportive  Summary: Kathleen Everett is a 53 y.o. female who presents with Change in energy/activity; Difficulty Concentrating; Fatigue; Irritability; Sleep (too much or little); Tearfulness; Worthlessness; anxious feelings, and tension. Pt was oriented times 5. Pt was cooperative and engaged. Pt denies SI/HI/AVH.      Reports she started back on her medicine per our last conversation and following a recent visit with her psychiatrist.    Cln utilized the first half of session to review patients progress. See progress notes documented above.   The clinician readministered the PHQ-9 and GAD-7 assessments. The patient's anxiety scores increased from 5 to 10, and depression scores also decreased from 15 to 13. The patient shares concerns about her irritability.  Reports her provider believes this may be due to sleep. Shares she also has anxiety about upcoming back procedure. States, I'm finally getting tax stuff together seeking support from friends.   Cln encouraged patient to write down social interactions in an effort to have real time events she can process with the cln to address internalization and over thinking.    Suicidal/Homicidal: Nowithout intent/plan  Therapist Response: Cln utilized active and supportive reflection to assist in patient feeling safe to process.  Cln assessed for current safety, symptoms, and stressors since last session. Re-administered GAD7 and PHQ9 screeners. Updated patient TP base don reported progress.    Plan: Return again in 2 weeks.  Diagnosis: Generalized anxiety disorder  MDD (major depressive disorder), recurrent episode, mild   Collaboration of Care:  [facility] psychiatrist can access notes and cln. Will review psychiatrists' notes. Check in with the patient and will see LCSW per availability. Patient agreed with treatment recommendations.   Patient/Guardian was advised Release of Information must be obtained prior to any record release in order to collaborate their care with an outside provider. Patient/Guardian was advised if they have not already done so to contact the registration department to sign all necessary forms in order for us  to release information regarding their care.   Consent: Patient/Guardian gives verbal consent for treatment and assignment of benefits for services provided during this visit. Patient/Guardian expressed understanding and agreed to proceed.   Kathleen Everett Husband, LCSW 08/16/2024

## 2024-08-20 ENCOUNTER — Encounter: Payer: Self-pay | Admitting: Pharmacist

## 2024-08-20 NOTE — Progress Notes (Deleted)
 Pharmacy Quality Measure Review  This patient is appearing on a report for being at risk of failing the adherence measure for hypertension (ACEi/ARB) medications this calendar year.   Medication: lisinopril-hydrochlorothiazide 20/12.5 Last fill date: 05/03/24 for 90 day supply  Insurance report was not up to date. No action needed at this time.  Medication has been refilled as of 07/28/24 x90 ds. 2 additional refills remaining.  Refill due 12/9

## 2024-08-20 NOTE — Progress Notes (Signed)
 Pharmacy Quality Measure Review  This patient is appearing on a report for being at risk of failing the adherence measure for diabetes medications this calendar year.   Medication: metformin  500 mg Last fill date: 07/20/24 for 30 day supply  Insurance report was not up to date. No action needed at this time.  Medication has been refilled as of 08/18/24 x30 ds though has not yet been picked up.

## 2024-08-24 DIAGNOSIS — G894 Chronic pain syndrome: Secondary | ICD-10-CM | POA: Diagnosis not present

## 2024-08-24 DIAGNOSIS — M533 Sacrococcygeal disorders, not elsewhere classified: Secondary | ICD-10-CM | POA: Diagnosis not present

## 2024-08-26 ENCOUNTER — Ambulatory Visit: Payer: Medicare PPO | Admitting: Dermatology

## 2024-08-30 ENCOUNTER — Ambulatory Visit (INDEPENDENT_AMBULATORY_CARE_PROVIDER_SITE_OTHER): Admitting: Licensed Clinical Social Worker

## 2024-08-30 DIAGNOSIS — F330 Major depressive disorder, recurrent, mild: Secondary | ICD-10-CM

## 2024-08-30 DIAGNOSIS — F411 Generalized anxiety disorder: Secondary | ICD-10-CM | POA: Diagnosis not present

## 2024-08-30 NOTE — Progress Notes (Signed)
 THERAPIST PROGRESS NOTE  Virtual Visit via Video Note  I connected with Kathleen Everett on 08/30/24 at  9:00 AM EDT by a video enabled telemedicine application and verified that I am speaking with the correct person using two identifiers.  Location: Patient: Address on file  Provider: Providers address   I discussed the limitations of evaluation and management by telemedicine and the availability of in person appointments. The patient expressed understanding and agreed to proceed.  I discussed the assessment and treatment plan with the patient. The patient was provided an opportunity to ask questions and all were answered. The patient agreed with the plan and demonstrated an understanding of the instructions.   The patient was advised to call back or seek an in-person evaluation if the symptoms worsen or if the condition fails to improve as anticipated.  I provided 58 minutes of non-face-to-face time during this encounter.   Kathleen KATHEE Husband, LCSW   Session Time: 9-9:58am  Participation Level: Active  Behavioral Response: CasualAlertEuthymic  Type of Therapy: Individual Therapy  Treatment Goals addressed:  Active     Anxiety     LTG: Kathleen Everett will score less than 5 on the Generalized Anxiety Disorder 7 Scale (GAD-7)  (Not Progressing)     Start:  02/06/24    Expected End:  11/15/24       Goal Note     08/16/24: Reports she is anxious about upcoming surgery.          STG: Kathleen Everett will reduce frequency of avoidant behaviors by 50% as evidenced by self-report in therapy sessions (Progressing)     Start:  02/06/24    Expected End:  11/15/24       Goal Note     08/16/24: Reports she is back to going to church and having girls weekends.          Perform psychoeducation regarding anxiety disorders     Start:  02/06/24         Work with patient individually to identify the major components of a recent episode of anxiety: physical  symptoms, major thoughts and images, and major behaviors they experienced     Start:  02/06/24         Coping Skills      Start:  02/06/24       Will work with the pt using CBT/DBT techniques to help the pt verbalize an understanding of the cognitive, physiological, and behavioral components of anxiety and its treatment. This will be done by using worksheets, interactive activities, CBT/ABC thought logs, modeling, homework, role playing and journaling. Will work with pt to learn and implement coping skills that result in a reduction of anxiety and improve daily functioning per pt self-report 3 out of 5 documented sessions.         OP Depression     LTG: Reduce frequency, intensity, and duration of depression symptoms so that daily functioning is improved (Not Progressing)     Start:  02/06/24    Expected End:  11/15/24       Goal Note     08/16/24: Reports she feels like she has to wear a mask at home. Often internalizes blame when others don't react in conversation as she expected. Cln encouraged patient to consider other peoples perceptions and what they may dealing before applying blame to herself.          LTG: Increase coping skills to manage depression and improve ability to perform  daily activities (Progressing)     Start:  02/06/24    Expected End:  11/15/24       Goal Note     08/16/24: Reports successful use of breathing and bilateral tapping.          LTG: Improve coping abilities with her chronic pain.  (Not Progressing)     Start:  02/06/24    Expected End:  11/15/24       Goal Note     08/16/24: Reports no improvement. Shares it is difficult to be present with her chronic pain.          STG: Not to get so agitated  (Not Progressing)     Start:  02/06/24    Expected End:  11/15/24       Goal Note     08/16/24: Reports she tried to bite her tongue but she often has urges to talk back to others.Shares she jokes a lot and feels misunderstood.          Work with Kathleen Everett to identify the major components of a recent episode of depression: physical symptoms, major thoughts and images, and major behaviors they experienced     Start:  02/06/24         Therapist will educate patient on cognitive distortions and the rationale for treatment of depression     Start:  02/06/24         Kathleen Everett will identify 2 cognitive distortions they are currently using and write reframing statements to replace them     Start:  02/06/24         Coping Skills      Start:  02/06/24       Will work with the pt using CBT/DBT techniques to help the pt verbalize an understanding of the cognitive, physiological, and behavioral components of depression and its treatment. This will be done by using worksheets, interactive activities, CBT/ABC thought logs, modeling, homework, role playing and journaling. Will work with pt to learn and implement coping skills that result in a reduction of depression and improve daily functioning per pt self-report 3 out of 5 documented sessions.          ProgressTowards Goals: Progressing  Interventions: Solution Focused, Assertiveness Training, and Supportive  Summary: Kathleen Everett is a 53 y.o. female who presents with Change in energy/activity; Difficulty Concentrating; Fatigue; Irritability; Sleep (too much or little); Tearfulness; Worthlessness; anxious feelings, and tension. Pt was oriented times 5. Pt was cooperative and engaged. Pt denies SI/HI/AVH.     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 She reports that in a few weeks, she will undergo a similar procedure on the opposite side. During her appointment, she became tearful, expressing feelings of depression stemming from her current limitations, which prevent her from engaging in activities she enjoys as she navigates the recovery  process.  Additionally, she has been experiencing heightened irritability and resentment toward her relatives, particularly following recent interactions. The patient finds it increasingly challenging to rely on others for support, which intensifies her feelings of isolation during this healing journey. She also mentioned a sense of emotional neglect, feeling unsupported by those around her during this difficult time.  Explored the patients reported sense of misplaced guilt, particularly regarding her past actions in the workplace, which she associates with having harmed herself therefore leading her to be out of  work. Recently experienced triggers for her depressive episodes have led to incidents of avoidance in her relationship with her partner, further deepening her sense of disconnection and loneliness.  Suicidal/Homicidal: Nowithout intent/plan  Therapist Response: Cln utilized active and supportive reflection to assist in patient feeling safe to process. Cln assessed for current safety, symptoms, and stressors since last session. Cln worked with the patient to explore ways she feels she needs support during this time and identified phrasing she can utilize to communicate with her loved ones during this time of transition. The cln also worked with the patient to consider alternative perspectives of other parties in an effort to foster a sense of connection. Identified common patterns that contribute to parties feeling disrespected and drew connections to underlying motives behind certain behaviors.   Plan: Return again in 2 weeks.  Diagnosis: Generalized anxiety disorder  MDD (major depressive disorder), recurrent episode, mild   Collaboration of Care: [facility] psychiatrist can access notes and cln. Will review psychiatrists' notes. Check in with the patient and will see LCSW per availability. Patient agreed with treatment recommendations.   Patient/Guardian was advised Release of Information must  be obtained prior to any record release in order to collaborate their care with an outside provider. Patient/Guardian was advised if they have not already done so to contact the registration department to sign all necessary forms in order for us  to release information regarding their care.   Consent: Patient/Guardian gives verbal consent for treatment and assignment of benefits for services provided during this visit. Patient/Guardian expressed understanding and agreed to proceed.   Kathleen KATHEE Husband, LCSW 08/30/2024

## 2024-09-05 ENCOUNTER — Other Ambulatory Visit (INDEPENDENT_AMBULATORY_CARE_PROVIDER_SITE_OTHER): Payer: Self-pay | Admitting: Family Medicine

## 2024-09-05 DIAGNOSIS — E559 Vitamin D deficiency, unspecified: Secondary | ICD-10-CM

## 2024-09-06 NOTE — Progress Notes (Unsigned)
 SUBJECTIVE: Discussed the use of AI scribe software for clinical note transcription with the patient, who gave verbal consent to proceed.  Chief Complaint: Obesity  Interim History: She has gained 4 lbs since her last visit.  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   Kathleen Everett is here to discuss her progress with her obesity treatment plan. She is on the keeping a food journal and adhering to recommended goals of 1200 calories and 90 grams of protein and states she is following her eating plan approximately 25 % of the time. She states she is not exercising due to her recent surgery.   Kathleen Everett is a 53 year old female who presents for follow-up of her obesity treatment plan.  She recently underwent back surgery two weeks ago and had a hip replacement prior to that. Her activity level has been limited following the recent back surgery, and she is up four pounds since her last visit but has maintained her muscle mass well.  She is tracking her calories with goals of 1200 calories and 90 grams of protein daily but is only following it approximately 25% of the time due to her recent surgeries. She is trying to eat more whole foods but is not necessarily getting the recommended amount of protein. She is drinking adequate water and is not skipping meals. She sleeps seven to nine hours nightly.  She is on metformin  500 mg twice daily for insulin  resistance and receives B12 injections of 1000 mcg once monthly. She reports some swelling and bruising post-surgery and is waiting to hear back about scheduling surgery for the left side of the SI joint.  She has been eating quick and easy meals due to difficulty standing for long periods, such as frozen waffles and biscuits. She has been drinking protein shakes and trying to incorporate more chicken into her diet. She is also  consuming avocados and kiwis for healthy fats and fiber.  She experienced constipation following surgery but is currently managing well. She is trying to include more fresh fruits and vegetables in her diet, such as bananas and grapes.  She is experiencing increased pain, which has affected her water intake. She acknowledges eating more out of boredom due to limited mobility and is trying to engage in activities like playing games on her phone to distract herself from pain and food cravings.  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 Weight increased by four pounds since last visit. Current dietary intake includes inadequate protein and increased consumption of convenience foods due to limited mobility. Metformin  500 mg twice daily is well tolerated without gastrointestinal side effects. - Continue metformin  500 mg twice daily - Encourage increased protein intake to support healing and muscle maintenance - Provide recipes and meal ideas to increase protein intake, including slow cooker options and  high-protein breakfast items - Suggest using protein shakes as a supplement to meet protein goals - Advise on healthier snack options, such as Quest chips and bars, to replace high-calorie snacks - Discuss strategies to manage boredom-related eating, including engaging in activities that occupy hands  Insulin  Resistance Last fasting insulin  was 14.1. A1c was 5.0. Polyphagia:Yes Medication(s): Metformin  500 mg twice daily  with meals No GI or other side effects with metformin .  Lab Results  Component Value Date   HGBA1C 5.0 02/17/2024   HGBA1C 5.1 07/14/2023   HGBA1C 5.3 10/22/2022   HGBA1C 5.4 07/19/2022   HGBA1C 5.6 03/06/2022   Lab Results  Component Value Date   INSULIN  14.1 02/17/2024   INSULIN  14.5 07/14/2023  Her HOMA-IR is 2.9 which is elevated. Optimal level < 1.9. This is complex condition associated with genetics, ectopic fat and lifestyle factors. Insulin  resistance may result in weight gain, abnormal cravings (particularly for carbs) and fatigue. This may result in additional weight gain and lead to pre-diabetes and diabetes if untreated.   Lab Results  Component Value Date   HGBA1C 5.0 02/17/2024   Lab Results  Component Value Date   INSULIN  14.1 02/17/2024   INSULIN  14.5 07/14/2023   Lab Results  Component Value Date   GLUCOSE 82 06/08/2024   GLUCOSE 98 03/18/2012    We reviewed treatment options which include losing 7 to 10% of body weight, increasing physical activity to a 150 minutes a week of moderate intensity.Her activity level is currently limited by her recent surgeries.  Continues metformin  500 mg BID for incretin effect.  Plan: Continue and refill Metformin  500 mg twice daily with meals Continue working on nutrition plan to decrease simple carbohydrates, increase lean proteins and exercise to promote weight loss, improve glycemic control and prevent progression to Type 2 diabetes.  Meds ordered this encounter  Medications   metFORMIN  (GLUCOPHAGE ) 500 MG tablet    Sig: Take 1 tablet (500 mg total) by mouth 2 (two) times daily with a meal.    Dispense:  60 tablet    Refill:  1    Postoperative state following surgery- SI joint fusion- 2 weeks ago and left hip arthroplasty- 8 wks ago Postoperative state following recent back surgery and prior left hip arthroplasty. Limited mobility due to recent surgery, with some swelling and bruising. Awaiting approval for left SI  joint surgery, anticipated in mid-November. - Encourage gradual increase in activity as tolerated Continue to follow up with surgery for progression of activities.  Encouraged adequate protein intake for post operative healing.  - Plan follow-up after anticipated SI joint surgery in early December  Mixed hyperlipidemia and aortic atherosclerosis On Lipitor 20 mg daily. No SE.  Insurance would not cover Wegovy .   Lab Results  Component Value Date   CHOL 183 02/17/2024   CHOL 222 (H) 10/31/2023   CHOL 278 (H) 07/14/2023   Lab Results  Component Value Date   HDL 57 02/17/2024   HDL 62.80 10/31/2023   HDL 47 07/14/2023   Lab Results  Component Value Date   LDLCALC 113 (H) 02/17/2024   LDLCALC 144 (H) 10/31/2023   LDLCALC 188 (H) 07/14/2023   Lab Results  Component Value Date   TRIG 72 02/17/2024   TRIG 79.0 10/31/2023   TRIG 227 (H) 07/14/2023   Lab Results  Component Value Date   CHOLHDL 4 10/31/2023   CHOLHDL 5 10/22/2022   CHOLHDL 6 07/19/2022   Lab Results  Component Value Date  LDLDIRECT 181.0 06/26/2020   LDLDIRECT 218.0 06/18/2018   LDLDIRECT 162.0 08/01/2016  LDL is improving. HDL improving. Trig at goal.  Plan: Continue to work on nutrition plan -decreasing simple carbohydrates, increasing lean proteins, decreasing saturated fats and cholesterol , avoiding trans fats and exercise as able to promote weight loss, improve lipids and decrease cardiovascular risks. Continue Lipitor 20 mg daily.    General Health Maintenance Discussion focused on dietary modifications to support weight management and healing post-surgery. Emphasis on protein intake, healthy fats, and low-sugar fruits. - Encourage consumption of healthy fats like avocados and low-sugar fruits such as berries and apples - Advise on portion control strategies for upcoming holidays - She would like to come back in after her next surgery - Next appointment scheduled for early December.  Vitals Temp:  98.9 F (37.2 C) BP: 106/71 Pulse Rate: 64 SpO2: 97 %   Anthropometric Measurements Height: 5' 9.5 (1.765 m) Weight: 203 lb (92.1 kg) BMI (Calculated): 29.56 Weight at Last Visit: 199 lb Weight Lost Since Last Visit: 0 Weight Gained Since Last Visit: 4 lb Starting Weight: 219 lb Total Weight Loss (lbs): 16 lb (7.258 kg) Peak Weight: 320 lb   Body Composition  Body Fat %: 41.5 % Fat Mass (lbs): 84.4 lbs Muscle Mass (lbs): 113 lbs Total Body Water (lbs): 79 lbs Visceral Fat Rating : 9   Other Clinical Data Fasting: No Labs: No Today's Visit #: 12 Starting Date: 07/14/23     ASSESSMENT AND PLAN:  Diet: Jhania is currently in the action stage of change. As such, her goal is to continue with weight loss efforts. She has agreed to keeping a food journal and adhering to recommended goals of 1200 calories and 90+ grams of  protein.  Exercise: Leontine has been instructed exercise as able once cleared by your surgeon for weight loss and overall health benefits.   Behavior Modification:  We discussed the following Behavioral Modification Strategies today: increasing lean protein intake, decreasing simple carbohydrates, increasing vegetables, increase H2O intake, increase high fiber foods, meal planning and cooking strategies, ways to avoid boredom eating, better snacking choices, avoiding temptations, planning for success, and Discussed some high protein quick meal options to increase protein intake and improve satiety. We discussed various medication options to help Kamiah with her weight loss efforts and we both agreed to continue current treatment plan.  Return in about 7 weeks (around 10/26/2024).SABRA She was informed of the importance of frequent follow up visits to maximize her success with intensive lifestyle modifications for her multiple health conditions.  Attestation Statements:   Reviewed by clinician on day of visit: allergies, medications, problem list, medical  history, surgical history, family history, social history, and previous encounter notes.   Time spent on visit including pre-visit chart review and post-visit care and charting was 41 minutes.    Kathleen Kahler, PA-C

## 2024-09-07 ENCOUNTER — Ambulatory Visit (INDEPENDENT_AMBULATORY_CARE_PROVIDER_SITE_OTHER): Admitting: Physician Assistant

## 2024-09-07 ENCOUNTER — Other Ambulatory Visit (INDEPENDENT_AMBULATORY_CARE_PROVIDER_SITE_OTHER): Payer: Self-pay | Admitting: Physician Assistant

## 2024-09-07 ENCOUNTER — Encounter (INDEPENDENT_AMBULATORY_CARE_PROVIDER_SITE_OTHER): Payer: Self-pay | Admitting: Physician Assistant

## 2024-09-07 VITALS — BP 106/71 | HR 64 | Temp 98.9°F | Ht 69.5 in | Wt 203.0 lb

## 2024-09-07 DIAGNOSIS — Z981 Arthrodesis status: Secondary | ICD-10-CM

## 2024-09-07 DIAGNOSIS — Z6829 Body mass index (BMI) 29.0-29.9, adult: Secondary | ICD-10-CM

## 2024-09-07 DIAGNOSIS — E88819 Insulin resistance, unspecified: Secondary | ICD-10-CM

## 2024-09-07 DIAGNOSIS — I700 Atherosclerosis of aorta: Secondary | ICD-10-CM | POA: Diagnosis not present

## 2024-09-07 DIAGNOSIS — E559 Vitamin D deficiency, unspecified: Secondary | ICD-10-CM

## 2024-09-07 DIAGNOSIS — E669 Obesity, unspecified: Secondary | ICD-10-CM

## 2024-09-07 DIAGNOSIS — E782 Mixed hyperlipidemia: Secondary | ICD-10-CM | POA: Diagnosis not present

## 2024-09-07 DIAGNOSIS — Z96642 Presence of left artificial hip joint: Secondary | ICD-10-CM

## 2024-09-07 DIAGNOSIS — E538 Deficiency of other specified B group vitamins: Secondary | ICD-10-CM

## 2024-09-07 MED ORDER — METFORMIN HCL 500 MG PO TABS: 500.0000 mg | ORAL_TABLET | Freq: Two times a day (BID) | ORAL | 1 refills | Status: DC

## 2024-09-09 ENCOUNTER — Other Ambulatory Visit: Payer: Self-pay | Admitting: Internal Medicine

## 2024-09-09 ENCOUNTER — Ambulatory Visit: Admitting: Dermatology

## 2024-09-09 DIAGNOSIS — Z1231 Encounter for screening mammogram for malignant neoplasm of breast: Secondary | ICD-10-CM

## 2024-09-13 ENCOUNTER — Encounter: Payer: Self-pay | Admitting: Licensed Clinical Social Worker

## 2024-09-13 ENCOUNTER — Ambulatory Visit (INDEPENDENT_AMBULATORY_CARE_PROVIDER_SITE_OTHER): Admitting: Licensed Clinical Social Worker

## 2024-09-13 DIAGNOSIS — F330 Major depressive disorder, recurrent, mild: Secondary | ICD-10-CM | POA: Diagnosis not present

## 2024-09-13 DIAGNOSIS — F411 Generalized anxiety disorder: Secondary | ICD-10-CM | POA: Diagnosis not present

## 2024-09-13 NOTE — Progress Notes (Signed)
 THERAPIST PROGRESS   Virtual Visit via Video Note  I connected with Brilyn Tuller Mccluskey on 09/13/24 at  9:00 AM EDT by a video enabled telemedicine application and verified that I am speaking with the correct person using two identifiers.  Location: Patient: Address on file  Provider: Providers Address   I discussed the limitations of evaluation and management by telemedicine and the availability of in person appointments. The patient expressed understanding and agreed to proceed.   I discussed the assessment and treatment plan with the patient. The patient was provided an opportunity to ask questions and all were answered. The patient agreed with the plan and demonstrated an understanding of the instructions.   The patient was advised to call back or seek an in-person evaluation if the symptoms worsen or if the condition fails to improve as anticipated.  I provided 54 minutes of non-face-to-face time during this encounter.   [name] Husband, LCSW   Session Time: 9-9:54am  Participation Level: Active  Behavioral Response: CasualAlertEuthymic  Type of Therapy: Individual Therapy  Treatment Goals addressed:  Active     Anxiety     LTG: Jailyne Chieffo will score less than 5 on the Generalized Anxiety Disorder 7 Scale (GAD-7)  (Not Progressing)     Start:  02/06/24    Expected End:  11/15/24       Goal Note     08/16/24: Reports she is anxious about upcoming surgery.          STG: Astra Gregg will reduce frequency of avoidant behaviors by 50% as evidenced by self-report in therapy sessions (Progressing)     Start:  02/06/24    Expected End:  11/15/24       Goal Note     08/16/24: Reports she is back to going to church and having girls weekends.          Perform psychoeducation regarding anxiety disorders     Start:  02/06/24         Work with patient individually to identify the major components of a recent episode of anxiety: physical symptoms,  major thoughts and images, and major behaviors they experienced     Start:  02/06/24         Coping Skills      Start:  02/06/24       Will work with the pt using CBT/DBT techniques to help the pt verbalize an understanding of the cognitive, physiological, and behavioral components of anxiety and its treatment. This will be done by using worksheets, interactive activities, CBT/ABC thought logs, modeling, homework, role playing and journaling. Will work with pt to learn and implement coping skills that result in a reduction of anxiety and improve daily functioning per pt self-report 3 out of 5 documented sessions.         OP Depression     LTG: Reduce frequency, intensity, and duration of depression symptoms so that daily functioning is improved (Not Progressing)     Start:  02/06/24    Expected End:  11/15/24       Goal Note     08/16/24: Reports she feels like she has to wear a mask at home. Often internalizes blame when others don't react in conversation as she expected. Cln encouraged patient to consider other peoples perceptions and what they may dealing before applying blame to herself.          LTG: Increase coping skills to manage depression and improve ability to  perform daily activities (Progressing)     Start:  02/06/24    Expected End:  11/15/24       Goal Note     08/16/24: Reports successful use of breathing and bilateral tapping.          LTG: Improve coping abilities with her chronic pain.  (Not Progressing)     Start:  02/06/24    Expected End:  11/15/24       Goal Note     08/16/24: Reports no improvement. Shares it is difficult to be present with her chronic pain.          STG: Not to get so agitated  (Not Progressing)     Start:  02/06/24    Expected End:  11/15/24       Goal Note     08/16/24: Reports she tried to bite her tongue but she often has urges to talk back to others.Shares she jokes a lot and feels misunderstood.          Work with [name] to identify the major components of a recent episode of depression: physical symptoms, major thoughts and images, and major behaviors they experienced     Start:  02/06/24         Therapist will educate patient on cognitive distortions and the rationale for treatment of depression     Start:  02/06/24         Milania Haubner will identify 2 cognitive distortions they are currently using and write reframing statements to replace them     Start:  02/06/24         Coping Skills      Start:  02/06/24       Will work with the pt using CBT/DBT techniques to help the pt verbalize an understanding of the cognitive, physiological, and behavioral components of depression and its treatment. This will be done by using worksheets, interactive activities, CBT/ABC thought logs, modeling, homework, role playing and journaling. Will work with pt to learn and implement coping skills that result in a reduction of depression and improve daily functioning per pt self-report 3 out of 5 documented sessions.        Progress Towards Goals: Progressing  Interventions: CBT, Assertiveness Training, Supportive, and Reframing  Summary: Emagene Merfeld is a 53 y.o. female who presents with Change in energy/activity; Difficulty Concentrating; Fatigue; Irritability; Sleep (too much or little); Tearfulness; Worthlessness; anxious feelings, and tension. Pt was oriented times 5. Pt was cooperative and engaged. Pt denies SI/HI/AVH.      In regard to her disability reimbursement, we discussed recent changes.   She reflected on completing her taxes for the year and identified supports and resources she can engage with in the next tax year.   She also shared her feelings about a recent personal situation with her church, expressing that she felt unsupported following her recent surgery. We talked about the use of assertive communication with her family, particularly regarding her  feelings of disrespect.  Additionally, she expressed that her relationship with her kids has made her feel left out. We explored ways she can use assertive communication to challenge her negative thinking patterns, including mind reading and fortune telling.   Suicidal/Homicidal: Nowithout intent/plan  Therapist Response: Cln utilized active and supportive reflection to assist in patient feeling safe to process. Cln assessed for current safety, symptoms, and stressors since last session.  Clinician challenged patient to recognize the thinking trap she was engaging  in and encouraged patient to identify alternative perspectives in her situation.  Clinician assisted patient in recognizing patterns of avoidance regarding use of assertive communication.  Plan: Return again in 2 weeks.  Diagnosis: Generalized anxiety disorder  MDD (major depressive disorder), recurrent episode, mild   Collaboration of Care: [facility]EB psychiatrist can access notes and cln. Will review psychiatrists' notes. Check in with the patient and will see LCSW per availability. Patient agreed with treatment recommendations.   Patient/Guardian was advised Release of Information must be obtained prior to any record release in order to collaborate their care with an outside provider. Patient/Guardian was advised if they have not already done so to contact the registration department to sign all necessary forms in order for us  to release information regarding their care.   Consent: Patient/Guardian gives verbal consent for treatment and assignment of benefits for services provided during this visit. Patient/Guardian expressed understanding and agreed to proceed.   [name] Husband, LCSW 09/13/2024

## 2024-09-15 ENCOUNTER — Inpatient Hospital Stay: Attending: Obstetrics and Gynecology | Admitting: Obstetrics and Gynecology

## 2024-09-15 ENCOUNTER — Encounter: Payer: Self-pay | Admitting: Obstetrics and Gynecology

## 2024-09-15 VITALS — BP 144/75 | HR 74 | Temp 98.6°F | Resp 20 | Wt 209.6 lb

## 2024-09-15 DIAGNOSIS — N952 Postmenopausal atrophic vaginitis: Secondary | ICD-10-CM | POA: Diagnosis not present

## 2024-09-15 DIAGNOSIS — N9089 Other specified noninflammatory disorders of vulva and perineum: Secondary | ICD-10-CM | POA: Diagnosis not present

## 2024-09-15 DIAGNOSIS — N903 Dysplasia of vulva, unspecified: Secondary | ICD-10-CM | POA: Insufficient documentation

## 2024-09-15 DIAGNOSIS — R102 Pelvic and perineal pain unspecified side: Secondary | ICD-10-CM | POA: Insufficient documentation

## 2024-09-15 MED ORDER — ESTRADIOL 0.01 % VA CREA: 1.0000 | TOPICAL_CREAM | Freq: Every day | VAGINAL | 12 refills | Status: AC

## 2024-09-15 NOTE — Progress Notes (Signed)
 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 Elby on 04/24/22. Per Bernarda Copland it looks like she had a LEEP in 2008 by Dr. Nonda.   She presents for evaluation of VIN after imiquimod  treatment x 1. She was unable to tolerate and discontinued. She used vaginal/vulvar estrogen for atrophy. She c/o pelvic pain. No significant vulvar irritation vaginal spotting or bleeding.   She never had the pelvic ultrasound. CT 06/09/2024 was unremarkable   CT A/P Reproductive: Inferior pelvis obscured by beam hardening artifact. There is no adnexal mass.  IMPRESSION: 1. No acute findings in the abdomen or pelvis. Specifically, no findings to explain the patient's history of left lower quadrant pain. 2. Status post bilateral hip replacement. 3.  Aortic Atherosclerosis (ICD10-I70.0).  Gynecologic History:  She presented with pain and slow healing hole in LT labia minora; sx off and on for over a year. Has had neg yeast and BV cultures, neg HSV 2 testing. Pt has tried nystatin  crm, estrace  crm, coconut oil, and vaseline without full relief. She couldn't tolerate lotrisone  cream.   03/04/2022 On exam noted to have anterior vulvar lesion that was biopsied. 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   Non-smoker, No abnormal Pap - last Pap 06/24/2019 NIML satisfactory with transformation zone. She doesn't recall a h/o abnormal Pap but thinks she had a LEEP in 2011.   On 04/24/22 she underwent EUA, Colpo of vulva, ablation with bovie, biopsy, and periurethral biopsy. Exam under anesthesia and colposcopy of the vulva after application of acetic acid  revealed 20 x 0.8 mm acetowhite epithelium involving the vulvar  tissue adjacent to the urethra. The vagina and cervix were normal. There were no lesion. Bimanual exam was negative for masses or nodularity. All visible vulvar lesions were ablated.   DIAGNOSIS:  A.  VULVA, INFERIOR; BIOPSY: 1:00 (0.7 x 0.2 x 0.2 cm) - HIGH-GRADE SQUAMOUS INTRAEPITHELIAL LESION (HSIL/VIN-2).   B.  PERI URETHRA; BIOPSY:  - HIGH-GRADE SQUAMOUS INTRAEPITHELIAL LESION (HSIL/VIN-2).  These areas were completely ablated.   She represented to clinic with pain/irritation lower on the vulva adjacent the the Bartholin's gland duct.   06/12/2022 DIAGNOSIS:  A. VULVA, LEFT; BIOPSY:  - BENIGN SQUAMOUS MUCOSA WITH FOCAL INFLAMMATION AND REACTIVE CELLULAR CHANGES.  - NEGATIVE FOR SQUAMOUS INTRAEPITHELIAL LESION AND MALIGNANCY.   12/18/22-  Pap- NILM, HPV Negative  Biopsies were obtained d/t persistent vaginal irritation along left vulva and introitus.  DIAGNOSIS:  A. VULVA, LEFT LATERAL; BIOPSY:  - MILD SPONGIOSIS AND KERATOSIS.  - NEGATIVE FOR SQUAMOUS INTRAEPITHELIAL LESION AND MALIGNANCY.   B.  VULVA, 1:00 MEDIAL; BIOPSY:  - LOW-GRADE SQUAMOUS INTRAEPITHELIAL LESION (LSIL/VIN-1)   12/30/2022 Topical imiquimod  was recommended 3 nights a week. She was unable to tolerate and discontinued after single dose.    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   TMJ arthralgia 10/27/2022   Hip pain, left 10/22/2022   Sacral pain 10/22/2022   Rash 07/20/2022    Bloating 07/19/2022   Acute cystitis without hematuria 06/12/2022   Vulvar irritation 06/12/2022   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   Runny nose 09/30/2021   Dizziness 07/22/2021   B12 deficiency    Vaginal irritation 03/03/2021   Soft tissue mass 03/03/2021   Hemorrhoids 12/23/2020   DDD (degenerative disc disease), lumbar 10/03/2020   Abscess of right hip 05/11/2020   Dyspnea on exertion 05/08/2020   Healthcare maintenance 01/02/2020   Anemia 01/02/2020   Status post total hip replacement, 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   Hyperglycemia 06/24/2019   Vaginal bleeding 09/30/2016   Primary osteoarthritis of right hip 01/17/2016   Arnold-Chiari malformation (HCC)    Chronic gastritis 07/18/2015   Depression, major, in remission 05/01/2015   Generalized anxiety disorder 05/01/2015   Chronic tension-type headache, intractable 04/24/2015   Dysmenorrhea 04/24/2015   Endometriosis 04/24/2015   Hidradenitis 04/24/2015   Hot flashes 03/12/2015   Chest pain 02/15/2015   BMI 31.0-31.9,adult 10/16/2014   Breast pain, right 09/08/2014   Abdominal pain 07/24/2014   GERD (gastroesophageal reflux disease) 04/24/2014   Ankle pain, right 04/24/2014   Vitamin D  deficiency 01/18/2014   Diverticulitis 01/10/2014   Right hip pain 01/10/2014   Migraine headaches 01/10/2014   Obstructive sleep apnea 01/10/2014   Chronic back pain 12/13/2012   Mitral valve prolapse 12/13/2012    Past Medical History: Past Medical History:  Diagnosis Date   Abdominal pain 07/24/2014   Abscess of right hip 05/11/2020   Anemia 01/02/2020   Ankle pain, right 04/24/2014   Anxiety    Attention deficit disorder (ADD)    B12 deficiency    Back pain    Breast pain, right 09/08/2014   Chest pain 02/15/2015   Chiari malformation type I 01/26/2014   s/p sgy decompression   Chronic back pain 12/13/2012   Chronic gastritis 07/18/2015   Chronic tension-type headache, intractable 04/24/2015   Constipation    DDD (degenerative disc  disease), lumbar    Depression    Diverticulitis    Dysmenorrhea    Dysplastic nevus 10/09/2015   R abdomen - moderate, limited margins free.   Endometriosis    Generalized anxiety disorder 05/01/2015   Has previously seen psychiatry. Having some issues with what she feels is related to stress and anxiety   GERD (gastroesophageal reflux disease)    Hemorrhoids 12/23/2020   Hidradenitis    High blood pressure    History of ovarian cyst    Hot flashes 03/12/2015   Hypercholesteremia 01/10/2014   Off lipitor. Elevated cholesterol. Labs discussed. Restart lipitor. Follow lipid panel and liver function tests.   Hyperglycemia 06/24/2019   IBS (irritable bowel syndrome)    Joint pain    Major depressive disorder 05/01/2015   Migraine headaches 01/10/2014   Has seen neurology. Topamax .   Mitral valve prolapse    Obesity    Obstructive sleep apnea 01/10/2014   nightly CPAP use   Osteoarthritis    PONV (postoperative nausea and vomiting)    Primary osteoarthritis of right hip 01/17/2016   Right hip pain 01/10/2014   Shortness of breath    Sleep apnea    Soft tissue mass 03/03/2021   Status post total hip replacement, 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   Swelling of both lower extremities    TMJ (dislocation  of temporomandibular joint)    Vaginal atrophy    Vitamin B12 deficiency    On injections   Vitamin D  deficiency 01/18/2014   Overview:  Level is 21.6  Formatting of this note might be different from the original. Level is 21.6   Vulvar atrophy     Past Surgical History: Past Surgical History:  Procedure Laterality Date   ABDOMINAL HYSTERECTOMY  03/27/07   laparoscopic   BREAST EXCISIONAL BIOPSY Left 2004   fibroadenoma   CESAREAN SECTION     1999/2004 twins   chiari decompression     COLONOSCOPY WITH PROPOFOL  N/A 04/28/2015   Procedure: COLONOSCOPY WITH PROPOFOL ;  Surgeon: Lamar ONEIDA Holmes, MD;  Location: [facility];  Service: Endoscopy;  Laterality: N/A;   COLONOSCOPY WITH  PROPOFOL  N/A 03/19/2021   Procedure: COLONOSCOPY WITH PROPOFOL ;  Surgeon: Maryruth Ole ONEIDA, MD;  Location: [facility] ENDOSCOPY;  Service: Endoscopy;  Laterality: N/A;   COLPOSCOPY N/A 04/24/2022   Procedure: COLPOSCOPY, ABLATION OF VULVAR TISSUE;  Surgeon: Elby Webb Loges, MD;  Location: [facility] ORS;  Service: Gynecology;  Laterality: N/A;   CYSTOSCOPY     bladder ulcers   ESOPHAGOGASTRODUODENOSCOPY N/A 04/28/2015   Procedure: ESOPHAGOGASTRODUODENOSCOPY (EGD);  Surgeon: Lamar ONEIDA Holmes, MD;  Location: [facility] ENDOSCOPY;  Service: Endoscopy;  Laterality: N/A;   ESOPHAGOGASTRODUODENOSCOPY (EGD) WITH PROPOFOL  N/A 03/19/2021   Procedure: ESOPHAGOGASTRODUODENOSCOPY (EGD) WITH PROPOFOL ;  Surgeon: Maryruth Ole ONEIDA, MD;  Location: [facility] ENDOSCOPY;  Service: Endoscopy;  Laterality: N/A;   HERNIA REPAIR Right 2003   inguinal   INCISION AND DRAINAGE HIP Right 05/11/2020   Procedure: IRRIGATION AND DEBRIDEMENT HIP;  Surgeon: Kathlynn Sharper, MD;  Location: [facility] OR[facility];  Service: Orthopedics;  Laterality: Right;   OVARIAN CYST REMOVAL  2001   right   TEMPOROMANDIBULAR JOINT SURGERY  2000   TOTAL HIP ARTHROPLASTY Right 12/28/2019   Procedure: TOTAL HIP ARTHROPLASTY ANTERIOR APPROACH;  Surgeon: Kathlynn Sharper, MD;  Location: [facility] ORS;  Service: Orthopedics;  Laterality: Right;   TOTAL HIP ARTHROPLASTY Left 05/13/2024   Procedure: ARTHROPLASTY, HIP, TOTAL, ANTERIOR APPROACH;  Surgeon: Lorelle Hussar, MD;  Location: [facility] ORS;  Service: Orthopedics;  Laterality: Left;   TUBAL LIGATION     Bilateral   VULVA /PERINEUM BIOPSY  04/24/2022   Procedure: VULVAR BIOPSY;  Surgeon: Elby Webb Loges, MD;  Location: [facility] ORS;  Service: Gynecology;;   Past Gynecologic History:  Menarche: 14 Menstrual details: n/a History of Abnormal pap: as per HPI Last pap: as per HPI Sexually active: yes  OB History:  OB History     Gravida  2   Para  2   Term  2   Preterm      AB      Living  3      SAB      IAB       Ectopic      Multiple  1   Live Births  3          Family History: Family History  Problem Relation Age of Onset   Hyperlipidemia Mother    Anxiety disorder Mother    Atrial fibrillation Mother    Hypertension Mother    Memory loss Mother    Anxiety disorder Father    Stroke Father    Hyperlipidemia Father    Hypertension Father    Congestive Heart Failure Father    Depression Father    Hyperlipidemia Brother    Colon cancer Paternal Aunt    Dementia Paternal Aunt  Dementia Maternal Grandmother    Cancer Maternal Grandmother        unk primary; d. late 46s   Dementia Paternal Grandmother    ADD / ADHD Daughter    Breast cancer Neg Hx    Social History: Social History   Tobacco Use   Smoking status: Never   Smokeless tobacco: Never  Vaping Use   Vaping status: Never Used  Substance Use Topics   Alcohol  use: Not Currently    Alcohol /week: 0.0 standard drinks of alcohol    Drug use: No    Allergies: Allergies  Allergen Reactions   Vancomycin  Hives   Ceftin  [Cefuroxime  Axetil] Other (See Comments)    Upset stomach   Celexa [Citalopram Hydrobromide] Diarrhea    GI upset     Clindamycin /Lincomycin Rash   Penicillins Rash    Did it involve swelling of the face/tongue/throat, SOB, or low BP? No Did it involve sudden or severe rash/hives, skin peeling, or any reaction on the inside of your mouth or nose? Unknown Did you need to seek medical attention at a hospital or doctor's office? No When did it last happen? childhood reaction If all above answers are "NO", may proceed with cephalosporin use.    Sulfa Antibiotics Rash   Current Medications: Current Outpatient Medications on File Prior to Visit  Medication Sig Dispense Refill   acetaminophen  (TYLENOL ) 500 MG tablet Take 2 tablets (1,000 mg total) by mouth every 8 (eight) hours. 30 tablet 0   AIMOVIG 140 MG/ML SOAJ Inject 140 mg into the skin every 28 (twenty-eight) days.     atorvastatin  (LIPITOR)  20 MG tablet Take 1 tablet (20 mg total) by mouth daily. 90 tablet 1   azelastine  (ASTELIN ) 0.1 % nasal spray Place 1 spray into both nostrils 2 (two) times daily. Use in each nostril as directed 30 mL 2   [START ON 09/22/2024] buPROPion  (WELLBUTRIN  XL) 300 MG 24 hr tablet Take 1 tablet (300 mg total) by mouth daily. 90 tablet 1   clobetasol  cream (TEMOVATE ) 0.05 % Apply 1 Application topically 2 (two) times daily. Apply to hands until smooth, then stop 30 g 5   cyanocobalamin  (VITAMIN B12) 1000 MCG/ML injection INJECT 1 ML INTRAMUSCULARLY  ONCE EVERY MONTH 10 mL 0   diclofenac Sodium (VOLTAREN) 1 % GEL Apply 2 g topically daily as needed (pain).     HYDROcodone -acetaminophen  (NORCO) 10-325 MG tablet Take 1 tablet by mouth in the morning, at noon, in the evening, and at bedtime.     metFORMIN  (GLUCOPHAGE ) 500 MG tablet Take 1 tablet (500 mg total) by mouth 2 (two) times daily with a meal. 60 tablet 1   methocarbamol  (ROBAXIN ) 500 MG tablet Take 500 mg by mouth 4 (four) times daily.     ondansetron  (ZOFRAN ) 4 MG tablet Take 1 tablet (4 mg total) by mouth every 8 (eight) hours as needed for nausea. 21 tablet 0   rizatriptan (MAXALT) 10 MG tablet Take 10 mg by mouth as needed for migraine. May repeat in 2 hours if needed     sertraline  (ZOLOFT ) 100 MG tablet Take 2 tablets (200 mg total) by mouth at bedtime. (Patient taking differently: Take 150 mg by mouth at bedtime.) 180 tablet 0   topiramate  (TOPAMAX ) 25 MG tablet Take 50 mg by mouth 2 (two) times daily.     Ubrogepant (UBRELVY) 100 MG TABS Take 100 mg by mouth 2 (two) times daily as needed (migraine).     XTAMPZA  ER 13.5 MG C12A  Take 13.5 mg by mouth 2 (two) times daily.     aspirin EC 81 MG tablet Take 162 mg by mouth daily. Swallow whole. (Patient not taking: Reported on 09/15/2024)     Crisaborole  (EUCRISA ) 2 % OINT Apply twice a day to hands prn flares (Patient not taking: Reported on 09/15/2024) 60 g 2   esomeprazole  (NEXIUM ) 40 MG capsule  TAKE 1 CAPSULE BY MOUTH TWICE DAILY BEFORE A MEAL (Patient not taking: Reported on 09/15/2024) 60 capsule 5   No current facility-administered medications on file prior to visit.   Review of Systems General: no complaints  HEENT: no complaints  Lungs: no complaints  Cardiac: no complaints  GI: no complaints  GU: no complaints  Musculoskeletal: no complaints  Extremities: no complaints  Skin: no complaints  Neuro: no complaints  Endocrine: no complaints  Psych: no complaints       Objective:  Physical Examination:  Today's Vitals   09/15/24 0840  BP: (!) 144/75  Pulse: 74  Resp: 20  Temp: 98.6 F (37 C)  SpO2: 100%  Weight: 209 lb 9.6 oz (95.1 kg)  PainSc: 6   PainLoc: Back    Body mass index is 30.51 kg/m.  ECOG Performance Status: 0 - Asymptomatic  GENERAL: Patient is a well appearing female in no acute distress HEENT:  Sclera clear. Anicteric EXTREMITIES:  No peripheral edema. Atraumatic. No cyanosis NEURO:  Nonfocal. Well oriented.  Appropriate affect. BACK: Healing incision. Dry clear and intact.   Pelvic: exam chaperoned by CMA   Vulva: atrophy; normal appearing vulva with no masses, tenderness or lesions. There is some depigmentation at the 12 o'clock area between the clitoris and urethra. Vagina: normal vagina;  Adnexa: surgically absent;  Uterus: surgically absent, vaginal cuff well healed;  Cervix: present and normal; stenotic os; mobile Rectal: not indicated   Lab Review No labs on site today.   Radiologic Imaging: N/A    Assessment:  Lashaunta Sicard is a 53 y.o. 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 Symptomatic with vulvar irritation, colposcopy more c/w atrophy than VIN. Biopsies obtained. Were benign and VIN I. Recommended topical treatment with aldara  but she was unable to tolerate and discontinued. Improved symptoms and  reassuring exam.   Chronic pelvic pain. Suspect pelvic adhesive disease. Unremarkable CT and symptoms now improved.   Medical co-morbidities complicating care: Chiari malformation type I, Sleep apnea, hyperglycemia, prior laparotomy Plan:   Problem List Items Addressed This Visit   Vulvar irritation - Plan: estradiol  (ESTRACE ) 0.01 % CREA vaginal cream  Vaginal atrophy - Plan: estradiol  (ESTRACE ) 0.01 % CREA vaginal cream  Vulvar dysplasia  h/o VIN; atrophy: Information on Apply topical estrogen estradiol  0.1 mg/gm to the vulva every night for 2-3 weeks and then 2-3x a week. The vaginal estrogen was reordered.   Will plan to see her back in 6 months and we can repeat colposcopy & possible biopsies if indicated.   The patient's diagnosis, an outline of the further diagnostic and laboratory studies which will be required, the recommendation, and alternatives were discussed.  All questions were answered to the patient's satisfaction.  I personally had a face to face interaction and evaluated the patient. I have reviewed her history and available records and have performed the physical exam.  I have discussed the case with the patient.   Oluwasemilore Bahl Isidor Constable, MD

## 2024-09-20 ENCOUNTER — Encounter: Payer: Self-pay | Admitting: Pharmacist

## 2024-09-20 NOTE — Progress Notes (Addendum)
 Pharmacy Quality Measure Review  This patient is appearing on a report for being at risk of failing the adherence measure for diabetes medications this calendar year.   Medication: metformin  500 mg Last fill date: 08/18/24 for 30 day supply  Contacted pharmacy to facilitate refills.  __________________________ Confirmed with pharmacy, medication was refilled 09/20/24 x30ds. Picked up by patient on 09/22/24.  1 refill remaining Next refill due ~12/5.

## 2024-09-21 ENCOUNTER — Encounter: Payer: Self-pay | Admitting: Licensed Clinical Social Worker

## 2024-09-21 ENCOUNTER — Ambulatory Visit: Admitting: Dermatology
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
## 2024-09-27 ENCOUNTER — Ambulatory Visit (INDEPENDENT_AMBULATORY_CARE_PROVIDER_SITE_OTHER): Admitting: Licensed Clinical Social Worker

## 2024-09-27 DIAGNOSIS — F411 Generalized anxiety disorder: Secondary | ICD-10-CM

## 2024-09-27 DIAGNOSIS — F330 Major depressive disorder, recurrent, mild: Secondary | ICD-10-CM

## 2024-09-27 NOTE — Progress Notes (Signed)
 THERAPIST PROGRESS NOTE  Virtual Visit via Video Note  I connected with Kathleen Everett on 09/27/24 at  9:00 AM EST by a video enabled telemedicine application and verified that I am speaking with the correct person using two identifiers.  Location: Patient: Address on file  Provider: Providers Address   I discussed the limitations of evaluation and management by telemedicine and the availability of in person appointments. The patient expressed understanding and agreed to proceed.   I discussed the assessment and treatment plan with the patient. The patient was provided an opportunity to ask questions and all were answered. The patient agreed with the plan and demonstrated an understanding of the instructions.   The patient was advised to call back or seek an in-person evaluation if the symptoms worsen or if the condition fails to improve as anticipated.  I provided 46 minutes of non-face-to-face time during this encounter.   Kathleen Everett Husband, LCSW   Session Time: 9-9:46am  Participation Level: Active  Behavioral Response: CasualAlertEuthymic  Type of Therapy: Individual Therapy  Treatment Goals addressed:  Active     Anxiety     LTG: Kathleen Everett will score less than 5 on the Generalized Anxiety Disorder 7 Scale (GAD-7)  (Not Progressing)     Start:  02/06/24    Expected End:  11/15/24       Goal Note     08/16/24: Reports she is anxious about upcoming surgery.          STG: Kathleen Everett will reduce frequency of avoidant behaviors by 50% as evidenced by self-report in therapy sessions (Progressing)     Start:  02/06/24    Expected End:  11/15/24       Goal Note     08/16/24: Reports she is back to going to church and having girls weekends.          Perform psychoeducation regarding anxiety disorders     Start:  02/06/24         Work with patient individually to identify the major components of a recent episode of anxiety: physical  symptoms, major thoughts and images, and major behaviors they experienced     Start:  02/06/24         Coping Skills      Start:  02/06/24       Will work with the pt using CBT/DBT techniques to help the pt verbalize an understanding of the cognitive, physiological, and behavioral components of anxiety and its treatment. This will be done by using worksheets, interactive activities, CBT/ABC thought logs, modeling, homework, role playing and journaling. Will work with pt to learn and implement coping skills that result in a reduction of anxiety and improve daily functioning per pt self-report 3 out of 5 documented sessions.         OP Depression     LTG: Reduce frequency, intensity, and duration of depression symptoms so that daily functioning is improved (Not Progressing)     Start:  02/06/24    Expected End:  11/15/24       Goal Note     08/16/24: Reports she feels like she has to wear a mask at home. Often internalizes blame when others don't react in conversation as she expected. Cln encouraged patient to consider other peoples perceptions and what they may dealing before applying blame to herself.          LTG: Increase coping skills to manage depression and improve ability to  perform daily activities (Progressing)     Start:  02/06/24    Expected End:  11/15/24       Goal Note     08/16/24: Reports successful use of breathing and bilateral tapping.          LTG: Improve coping abilities with her chronic pain.  (Not Progressing)     Start:  02/06/24    Expected End:  11/15/24       Goal Note     08/16/24: Reports no improvement. Shares it is difficult to be present with her chronic pain.          STG: Not to get so agitated  (Not Progressing)     Start:  02/06/24    Expected End:  11/15/24       Goal Note     08/16/24: Reports she tried to bite her tongue but she often has urges to talk back to others.Shares she jokes a lot and feels misunderstood.          Work with Kathleen Kathleen Everett to identify the major components of a recent episode of depression: physical symptoms, major thoughts and images, and major behaviors they experienced     Start:  02/06/24         Therapist will educate patient on cognitive distortions and the rationale for treatment of depression     Start:  02/06/24         Kathleen Everett will identify 2 cognitive distortions they are currently using and write reframing statements to replace them     Start:  02/06/24         Coping Skills      Start:  02/06/24       Will work with the pt using CBT/DBT techniques to help the pt verbalize an understanding of the cognitive, physiological, and behavioral components of depression and its treatment. This will be done by using worksheets, interactive activities, CBT/ABC thought logs, modeling, homework, role playing and journaling. Will work with pt to learn and implement coping skills that result in a reduction of depression and improve daily functioning per pt self-report 3 out of 5 documented sessions.          ProgressTowards Goals: Progressing  Interventions: Assertiveness Training and Supportive  Summary: Kathleen Everett is a 53 y.o. female who presents with Change in energy/activity; Difficulty Concentrating; Fatigue; Irritability; Sleep (too much or little); Tearfulness; Worthlessness; anxious feelings, and tension. Pt was oriented times 5. Pt was cooperative and engaged. Pt denies SI/HI/AVH.     Shares improved mood and communication within her relationships which she attributes to her consistent medication regimen. Explored barriers to taking her medication as prescribed. She reflected on positive consequences of taking her medications stating she is less liable with her mood.   Reflected on mood following recent diagnosis for her mother and having to serve as  her caretaker.   Reflected on anxiety provoking situations related to her mothers  passing and the unknown with future events. Reports she has always wanted to closer dynamic with her sibling. Explored solutions she is exploring with her mother to improve communication.   Reflected on improved communication with her daughters. Reports she was able to have an honest conversation with her daughter regarding transparent communication.      Suicidal/Homicidal: Nowithout intent/plan  Therapist Response: Cln utilized active and supportive reflection to assist in patient feeling safe to process. Cln assessed for current safety, symptoms, and stressors since last  session. Explored solutions for medication compliance. Reflected on recent conversation where patient explored effective communication.    Plan: Return again in 2 weeks.  Diagnosis: Generalized anxiety disorder  MDD (major depressive disorder), recurrent episode, mild   Collaboration of Care: [facility] psychiatrist can access notes and cln. Will review psychiatrists' notes. Check in with the patient and will see LCSW per availability. Patient agreed with treatment recommendations.   Patient/Guardian was advised Release of Information must be obtained prior to any record release in order to collaborate their care with an outside provider. Patient/Guardian was advised if they have not already done so to contact the registration department to sign all necessary forms in order for us  to release information regarding their care.   Consent: Patient/Guardian gives verbal consent for treatment and assignment of benefits for services provided during this visit. Patient/Guardian expressed understanding and agreed to proceed.   Kathleen Everett Husband, LCSW 09/27/2024

## 2024-10-01 ENCOUNTER — Encounter: Payer: Self-pay | Admitting: Psychiatry

## 2024-10-01 ENCOUNTER — Telehealth: Admitting: Psychiatry

## 2024-10-01 DIAGNOSIS — F3341 Major depressive disorder, recurrent, in partial remission: Secondary | ICD-10-CM

## 2024-10-01 DIAGNOSIS — F411 Generalized anxiety disorder: Secondary | ICD-10-CM | POA: Diagnosis not present

## 2024-10-01 MED ORDER — SERTRALINE HCL 100 MG PO TABS: 150.0000 mg | ORAL_TABLET | Freq: Every day | ORAL | 1 refills | Status: DC

## 2024-10-11 ENCOUNTER — Encounter: Payer: Self-pay | Admitting: Internal Medicine

## 2024-10-11 ENCOUNTER — Encounter: Payer: Self-pay | Admitting: Licensed Clinical Social Worker

## 2024-10-11 ENCOUNTER — Ambulatory Visit (INDEPENDENT_AMBULATORY_CARE_PROVIDER_SITE_OTHER): Admitting: Licensed Clinical Social Worker

## 2024-10-11 DIAGNOSIS — F3341 Major depressive disorder, recurrent, in partial remission: Secondary | ICD-10-CM | POA: Diagnosis not present

## 2024-10-11 DIAGNOSIS — F411 Generalized anxiety disorder: Secondary | ICD-10-CM

## 2024-10-11 NOTE — Telephone Encounter (Signed)
 Please schedule Kathleen Everett for a physical.

## 2024-10-11 NOTE — Progress Notes (Signed)
 THERAPIST PROGRESS NOTE  Virtual Visit via Video Note  I connected with Kathleen Everett on 10/11/24 at  9:00 AM EST by a video enabled telemedicine application and verified that I am speaking with the correct person using two identifiers.  Location: Patient: Address on file Provider: Providers Address   I discussed the limitations of evaluation and management by telemedicine and the availability of in person appointments. The patient expressed understanding and agreed to proceed.   I discussed the assessment and treatment plan with the patient. The patient was provided an opportunity to ask questions and all were answered. The patient agreed with the plan and demonstrated an understanding of the instructions.   The patient was advised to call back or seek an in-person evaluation if the symptoms worsen or if the condition fails to improve as anticipated.  I provided 60 minutes of non-face-to-face time during this encounter.   Kathleen Everett Husband, LCSW   Session Time: 9-10am  Participation Level: Active  Behavioral Response: CasualAlertAngry and Irritable  Type of Therapy: Individual Therapy  Treatment Goals addressed:  Active     Anxiety     LTG: Shereda Graw will score less than 5 on the Generalized Anxiety Disorder 7 Scale (GAD-7)  (Not Progressing)     Start:  02/06/24    Expected End:  11/15/24       Goal Note     08/16/24: Reports she is anxious about upcoming surgery.          STG: Tauheedah Bok will reduce frequency of avoidant behaviors by 50% as evidenced by self-report in therapy sessions (Progressing)     Start:  02/06/24    Expected End:  11/15/24       Goal Note     08/16/24: Reports she is back to going to church and having girls weekends.          Perform psychoeducation regarding anxiety disorders     Start:  02/06/24         Work with patient individually to identify the major components of a recent episode of anxiety:  physical symptoms, major thoughts and images, and major behaviors they experienced     Start:  02/06/24         Coping Skills      Start:  02/06/24       Will work with the pt using CBT/DBT techniques to help the pt verbalize an understanding of the cognitive, physiological, and behavioral components of anxiety and its treatment. This will be done by using worksheets, interactive activities, CBT/ABC thought logs, modeling, homework, role playing and journaling. Will work with pt to learn and implement coping skills that result in a reduction of anxiety and improve daily functioning per pt self-report 3 out of 5 documented sessions.         OP Depression     LTG: Reduce frequency, intensity, and duration of depression symptoms so that daily functioning is improved (Not Progressing)     Start:  02/06/24    Expected End:  11/15/24       Goal Note     08/16/24: Reports she feels like she has to wear a mask at home. Often internalizes blame when others don't react in conversation as she expected. Cln encouraged patient to consider other peoples perceptions and what they may dealing before applying blame to herself.          LTG: Increase coping skills to manage depression and improve ability  to perform daily activities (Progressing)     Start:  02/06/24    Expected End:  11/15/24       Goal Note     08/16/24: Reports successful use of breathing and bilateral tapping.          LTG: Improve coping abilities with her chronic pain.  (Not Progressing)     Start:  02/06/24    Expected End:  11/15/24       Goal Note     08/16/24: Reports no improvement. Shares it is difficult to be present with her chronic pain.          STG: Not to get so agitated  (Not Progressing)     Start:  02/06/24    Expected End:  11/15/24       Goal Note     08/16/24: Reports she tried to bite her tongue but she often has urges to talk back to others.Shares she jokes a lot and feels  misunderstood.         Work with [name] to identify the major components of a recent episode of depression: physical symptoms, major thoughts and images, and major behaviors they experienced     Start:  02/06/24         Therapist will educate patient on cognitive distortions and the rationale for treatment of depression     Start:  02/06/24         Vale Peraza will identify 2 cognitive distortions they are currently using and write reframing statements to replace them     Start:  02/06/24         Coping Skills      Start:  02/06/24       Will work with the pt using CBT/DBT techniques to help the pt verbalize an understanding of the cognitive, physiological, and behavioral components of depression and its treatment. This will be done by using worksheets, interactive activities, CBT/ABC thought logs, modeling, homework, role playing and journaling. Will work with pt to learn and implement coping skills that result in a reduction of depression and improve daily functioning per pt self-report 3 out of 5 documented sessions.           ProgressTowards Goals: Progressing  Interventions: Assertiveness Training and Supportive  Summary: Kathleen Everett is a 53 y.o. female who presents with Change in energy/activity; Difficulty Concentrating; Fatigue; Irritability; Sleep (too much or little); Tearfulness; Worthlessness; anxious feelings, and tension. Pt was oriented times 5. Pt was cooperative and engaged. Pt denies SI/HI/AVH.   Patient reflected on a recent incident involving miscommunication with another party, which highlighted the challenges in conveying thoughts and emotions effectively.  Addressed the topic of coping with financial insecurity, a persistent issue that evolved from childhood experiences, and we explored current barriers that exacerbate these feelings.  Discussed the feedback she received from others, specifically being labeled as someone  who complains too much, and how this perception impacts her interactions.  We explored her expectations of others and considered various compromises she could adopt to better manage stressors in her life. Role played use of assertive communication techniques to establish clear boundaries with those around her.  Continued to tackle feelings of misplaced guilt related to her disabilities, particularly concerning her roles and contributions to her family dynamic.   Provided psychoeducation on "The Four Horsemen" from the [employer], focusing on improving communication patterns within her relationships and enhancing overall relational health.   Suicidal/Homicidal: Nowithout intent/plan  Therapist Response: Cln utilized active and supportive reflection to assist in patient feeling safe to process. Cln assessed for current safety, symptoms, and stressors since last session. Provided psychoeducation on "The Four Horsemen" from the [employer]. Continued to engage in solution focused interventions related to dynamics in the home through assertive communication, boundaries, and compromising.   Plan: Return again in 2 weeks.  Diagnosis: Generalized anxiety disorder  MDD (major depressive disorder), recurrent, in partial remission   Collaboration of Care:  [facility] psychiatrist can access notes and cln. Will review psychiatrists' notes. Check in with the patient and will see LCSW per availability. Patient agreed with treatment recommendations.   Patient/Guardian was advised Release of Information must be obtained prior to any record release in order to collaborate their care with an outside provider. Patient/Guardian was advised if they have not already done so to contact the registration department to sign all necessary forms in order for us  to release information regarding their care.   Consent: Patient/Guardian gives verbal consent for treatment and assignment of benefits for services provided  during this visit. Patient/Guardian expressed understanding and agreed to proceed.   Kathleen Everett Husband, LCSW 10/11/2024

## 2024-10-12 ENCOUNTER — Ambulatory Visit
Admission: RE | Admit: 2024-10-12 | Discharge: 2024-10-12 | Disposition: A | Discharge status: Home / Self Care | Source: Ambulatory Visit | Attending: Internal Medicine | Admitting: Internal Medicine

## 2024-10-12 ENCOUNTER — Ambulatory Visit: Admitting: Dermatology

## 2024-10-12 ENCOUNTER — Encounter: Payer: Self-pay | Admitting: Dermatology

## 2024-10-12 DIAGNOSIS — L578 Other skin changes due to chronic exposure to nonionizing radiation: Secondary | ICD-10-CM

## 2024-10-12 DIAGNOSIS — W908XXA Exposure to other nonionizing radiation, initial encounter: Secondary | ICD-10-CM

## 2024-10-12 DIAGNOSIS — Z1231 Encounter for screening mammogram for malignant neoplasm of breast: Secondary | ICD-10-CM | POA: Insufficient documentation

## 2024-10-12 DIAGNOSIS — L821 Other seborrheic keratosis: Secondary | ICD-10-CM | POA: Diagnosis not present

## 2024-10-12 DIAGNOSIS — D229 Melanocytic nevi, unspecified: Secondary | ICD-10-CM

## 2024-10-12 DIAGNOSIS — L814 Other melanin hyperpigmentation: Secondary | ICD-10-CM

## 2024-10-12 DIAGNOSIS — L570 Actinic keratosis: Secondary | ICD-10-CM | POA: Diagnosis not present

## 2024-10-12 DIAGNOSIS — L820 Inflamed seborrheic keratosis: Secondary | ICD-10-CM

## 2024-10-12 DIAGNOSIS — Z86018 Personal history of other benign neoplasm: Secondary | ICD-10-CM

## 2024-10-12 DIAGNOSIS — Z1283 Encounter for screening for malignant neoplasm of skin: Secondary | ICD-10-CM | POA: Diagnosis not present

## 2024-10-12 DIAGNOSIS — D1801 Hemangioma of skin and subcutaneous tissue: Secondary | ICD-10-CM

## 2024-10-12 NOTE — Patient Instructions (Addendum)

## 2024-10-12 NOTE — Progress Notes (Signed)
 Follow-Up Visit   Subjective  Kathleen Everett is a 53 y.o. female who presents for the following: Skin Cancer Screening and Full Body Skin Exam  The patient presents for Total-Body Skin Exam (TBSE) for skin cancer screening and mole check. The patient has spots, moles and lesions to be evaluated, some may be new or changing and the patient may have concern these could be cancer.  Hx dysplastic nevus, AK's. Patient with hand dermatitis, uses clobetasol  prn. Patient with a spot under right eye that she picks at.   The following portions of the chart were reviewed this encounter and updated as appropriate: medications, allergies, medical history  Review of Systems:  No other skin or systemic complaints except as noted in HPI or Assessment and Plan.  Objective  Well appearing patient in no apparent distress; mood and affect are within normal limits.  A full examination was performed including scalp, head, eyes, ears, nose, lips, neck, chest, axillae, abdomen, back, buttocks, bilateral upper extremities, bilateral lower extremities, hands, feet, fingers, toes, fingernails, and toenails. All findings within normal limits unless otherwise noted below.   Relevant physical exam findings are noted in the Assessment and Plan.  R zygoma x 1, R frontal hairline x 1, L temple x 1, L nasal bridge x 1, chest x 8 (12) Erythematous thin papules/macules with gritty scale.  R infraorbital x 1 Erythematous stuck-on, waxy papule or plaque  Assessment & Plan   SKIN CANCER SCREENING PERFORMED TODAY.  ACTINIC DAMAGE - Chronic condition, secondary to cumulative UV/sun exposure - diffuse scaly erythematous macules with underlying dyspigmentation - Recommend daily broad spectrum sunscreen SPF 30+ to sun-exposed areas, reapply every 2 hours as needed.  - Staying in the shade or wearing long sleeves, sun glasses (UVA+UVB protection) and wide brim hats (4-inch brim around the entire circumference of  the hat) are also recommended for sun protection.  - Call for new or changing lesions.  LENTIGINES, SEBORRHEIC KERATOSES, HEMANGIOMAS - Benign normal skin lesions - Benign-appearing - Call for any changes  MELANOCYTIC NEVI - Tan-brown and/or pink-flesh-colored symmetric macules and papules - Benign appearing on exam today - Observation - Call clinic for new or changing moles - Recommend daily use of broad spectrum spf 30+ sunscreen to sun-exposed areas.   History of Dysplastic Nevi - No evidence of recurrence today - Recommend regular full body skin exams - Recommend daily broad spectrum sunscreen SPF 30+ to sun-exposed areas, reapply every 2 hours as needed.  - Call if any new or changing lesions are noted between office visits   AK (ACTINIC KERATOSIS) (12) R zygoma x 1, R frontal hairline x 1, L temple x 1, L nasal bridge x 1, chest x 8 (12) Actinic keratoses are precancerous spots that appear secondary to cumulative UV radiation exposure/sun exposure over time. They are chronic with expected duration over 1 year. A portion of actinic keratoses will progress to squamous cell carcinoma of the skin. It is not possible to reliably predict which spots will progress to skin cancer and so treatment is recommended to prevent development of skin cancer.  Recommend daily broad spectrum sunscreen SPF 30+ to sun-exposed areas, reapply every 2 hours as needed.  Recommend staying in the shade or wearing long sleeves, sun glasses (UVA+UVB protection) and wide brim hats (4-inch brim around the entire circumference of the hat). Call for new or changing lesions. Destruction of lesion - R zygoma x 1, R frontal hairline x 1, L temple x 1,  L nasal bridge x 1, chest x 8 (12) Complexity: simple   Destruction method: cryotherapy   Informed consent: discussed and consent obtained   Timeout:  patient name, date of birth, surgical site, and procedure verified Lesion destroyed using liquid nitrogen: Yes    Region frozen until ice ball extended beyond lesion: Yes   Cryo cycles: 1 or 2. Outcome: patient tolerated procedure well with no complications   Post-procedure details: wound care instructions given    INFLAMED SEBORRHEIC KERATOSIS R infraorbital x 1 Symptomatic, irritating, patient would like treated.  Benign-appearing.  Call clinic for new or changing lesions.   Destruction of lesion - R infraorbital x 1 Complexity: simple   Destruction method: cryotherapy   Informed consent: discussed and consent obtained   Timeout:  patient name, date of birth, surgical site, and procedure verified Lesion destroyed using liquid nitrogen: Yes   Region frozen until ice ball extended beyond lesion: Yes   Cryo cycles: 1 or 2. Outcome: patient tolerated procedure well with no complications   Post-procedure details: wound care instructions given    MULTIPLE BENIGN NEVI   LENTIGINES   ACTINIC ELASTOSIS   SEBORRHEIC KERATOSES   CHERRY ANGIOMA   Return in about 1 year (around 10/12/2025) for TBSE, with Dr. Claudene, HxDN.  LILLETTE Lonell Drones, RMA, am acting as scribe for Boneta Claudene, MD .   Documentation: I have reviewed the above documentation for accuracy and completeness, and I agree with the above.  Boneta Claudene, MD

## 2024-10-19 ENCOUNTER — Encounter: Payer: Self-pay | Admitting: Pharmacist

## 2024-10-19 NOTE — Progress Notes (Signed)
 Pharmacy Quality Measure Review  This patient is appearing on a report for being at risk of failing the adherence measure for diabetes medications this calendar year.   Medication: metformin  500 mg  Last fill date: 11/3 for 30 day supply  Contacted pharmacy to facilitate refills.

## 2024-10-26 ENCOUNTER — Ambulatory Visit (INDEPENDENT_AMBULATORY_CARE_PROVIDER_SITE_OTHER): Payer: Self-pay | Admitting: Physician Assistant

## 2024-11-05 ENCOUNTER — Ambulatory Visit: Admitting: Licensed Clinical Social Worker

## 2024-11-05 DIAGNOSIS — F411 Generalized anxiety disorder: Secondary | ICD-10-CM | POA: Diagnosis not present

## 2024-11-05 DIAGNOSIS — F3341 Major depressive disorder, recurrent, in partial remission: Secondary | ICD-10-CM | POA: Diagnosis not present
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
## 2024-11-15 ENCOUNTER — Ambulatory Visit (INDEPENDENT_AMBULATORY_CARE_PROVIDER_SITE_OTHER): Admitting: Licensed Clinical Social Worker

## 2024-11-15 DIAGNOSIS — F3341 Major depressive disorder, recurrent, in partial remission: Secondary | ICD-10-CM

## 2024-11-15 DIAGNOSIS — F411 Generalized anxiety disorder: Secondary | ICD-10-CM | POA: Diagnosis not present

## 2024-11-15 NOTE — Progress Notes (Signed)
 "  THERAPIST PROGRESS NOTE  Virtual Visit via Video Note  I connected with Kathleen Everett on 11/15/2024 at  9:00 AM EST by a video enabled telemedicine application and verified that I am speaking with the correct person using two identifiers.  Location: Patient: Address on file  Provider: Providers Address   I discussed the limitations of evaluation and management by telemedicine and the availability of in person appointments. The patient expressed understanding and agreed to proceed.   I discussed the assessment and treatment plan with the patient. The patient was provided an opportunity to ask questions and all were answered. The patient agreed with the plan and demonstrated an understanding of the instructions.   The patient was advised to call back or seek an in-person evaluation if the symptoms worsen or if the condition fails to improve as anticipated.  I provided 54 minutes of non-face-to-face time during this encounter.   Kathleen KATHEE Husband, LCSW   Session Time: 9-9:54am  Participation Level: Active  Behavioral Response: CasualAlertNegative and tearful  Type of Therapy: Individual Therapy  Treatment Goals addressed:  Active     Anxiety     LTG: Kathleen Everett will score less than 5 on the Generalized Anxiety Disorder 7 Scale (GAD-7)  (Not Progressing)     Start:  02/06/24    Expected End:  03/16/25         STG: Kathleen Everett will reduce frequency of avoidant behaviors by 50% as evidenced by self-report in therapy sessions (Progressing)     Start:  02/06/24    Expected End:  03/16/25       Goal Note     11/15/24: Patient reports she is willing and open to having assertive conversations with those around her to address situations that make her anxious in an effort to come to a mutual conclusion.  However, patient continues to identify task paralysis related to completion of tasks around the home due to feeling overwhelmed.         Perform  psychoeducation regarding anxiety disorders     Start:  02/06/24         Work with patient individually to identify the major components of a recent episode of anxiety: physical symptoms, major thoughts and images, and major behaviors they experienced     Start:  02/06/24         Coping Skills      Start:  02/06/24       Will work with the pt using CBT/DBT techniques to help the pt verbalize an understanding of the cognitive, physiological, and behavioral components of anxiety and its treatment. This will be done by using worksheets, interactive activities, CBT/ABC thought logs, modeling, homework, role playing and journaling. Will work with pt to learn and implement coping skills that result in a reduction of anxiety and improve daily functioning per pt self-report 3 out of 5 documented sessions.         OP Depression     LTG: Reduce frequency, intensity, and duration of depression symptoms so that daily functioning is improved (Not Progressing)     Start:  02/06/24    Expected End:  03/16/25       Goal Note     11/15/24: Patient identified stressors within the home are interfering with her ability to improve depressive symptoms.  Patient also identifies changes to medication regimen and negative side effects from medications interfering with medication compliance could be contributing to worsening depressive symptoms.  LTG: Increase coping skills to manage depression and improve ability to perform daily activities (Not Progressing)     Start:  02/06/24    Expected End:  03/16/25       Goal Note     11/15/24: Patient reports improvements with medication compliance based on solutions established in previous therapeutic sessions.  However, patient continues to notate some task paralysis with common household tasks despite solutions identified in previous sessions.         LTG: Improve coping abilities with her chronic pain.  (Not Progressing)     Start:  02/06/24     Expected End:  03/16/25         STG: Not to get so agitated  (Not Progressing)     Start:  02/06/24    Expected End:  03/16/25      AEB: patient will learn three triggers for her irritability and work with the clinician on learning coping strategies such as breathing techniques and thought reframing exercises.         Work with Kathleen Everett to identify the major components of a recent episode of depression: physical symptoms, major thoughts and images, and major behaviors they experienced     Start:  02/06/24         Therapist will educate patient on cognitive distortions and the rationale for treatment of depression     Start:  02/06/24         Kathleen Everett will identify 2 cognitive distortions they are currently using and write reframing statements to replace them     Start:  02/06/24         Coping Skills      Start:  02/06/24       Will work with the pt using CBT/DBT techniques to help the pt verbalize an understanding of the cognitive, physiological, and behavioral components of depression and its treatment. This will be done by using worksheets, interactive activities, CBT/ABC thought logs, modeling, homework, role playing and journaling. Will work with pt to learn and implement coping skills that result in a reduction of depression and improve daily functioning per pt self-report 3 out of 5 documented sessions.          ProgressTowards Goals: Progressing  Interventions: Solution Focused, Assertiveness Training, and Supportive  Summary:  Kathleen Everett is a 53 y.o. female who presents with Change in energy/activity; Difficulty Concentrating; Fatigue; Irritability; Sleep (too much or little); Tearfulness; Worthlessness; anxious feelings, and tension. Pt was oriented times 5. Pt was cooperative and engaged. Pt denies SI/HI/AVH.    During the first half of the session, Cln focused on reviewing the patient's progress, referencing the detailed  progress notes documented previously.   The clinician conducted a readministration of the PHQ-9 assessments to evaluate the patient's current mental health status. It was noted that the patient's depression scores had risen from 15 to 17. The patient expressed that she feels her depression has intensified, due to the discontinuation of her depression medications. She also discussed her growing irritability with her spouse, identifying it as a significant source of stress in her life.  In the session, we reflected on the differing value systems present within the marriage, discussing how these differences influence their interactions and contribute to conflict. The patient and the clinician explored various strategies to address these differences, particularly focusing on how she and her partner are attempting to manage their differing life goals. Additionally, we explored potential solutions for alleviating financial stressors,  brainstorming together about practical ways to improve their financial situation.  The patient reported that she has been successful in following through with her medication compliance.   Suicidal/Homicidal: Nowithout intent/plan  Therapist Response: Cln utilized active and supportive reflection to assist in patient feeling safe to process. Cln assessed for current safety, symptoms, and stressors since last session.  Updated patient's treatment plan based on reported progress.  Readministered PHQ-9 depression screener.  Reflected on stressors contributing to patient's increased depressive symptoms.  Role played conversations she can have with her spouse related to mutual financial goals.   Plan: Return again in 2 weeks.  Diagnosis: Generalized anxiety disorder  MDD (major depressive disorder), recurrent, in partial remission   Collaboration of Care: AEB psychiatrist can access notes and cln. Will review psychiatrists' notes. Check in with the patient and will see LCSW per  availability. Patient agreed with treatment recommendations.   Patient/Guardian was advised Release of Information must be obtained prior to any record release in order to collaborate their care with an outside provider. Patient/Guardian was advised if they have not already done so to contact the registration department to sign all necessary forms in order for us  to release information regarding their care.   Consent: Patient/Guardian gives verbal consent for treatment and assignment of benefits for services provided during this visit. Patient/Guardian expressed understanding and agreed to proceed.   Kathleen KATHEE Husband, LCSW 11/15/2024  "

## 2024-11-24 ENCOUNTER — Ambulatory Visit (INDEPENDENT_AMBULATORY_CARE_PROVIDER_SITE_OTHER): Admitting: Adult Health

## 2024-11-24 VITALS — BP 103/66 | HR 78 | Temp 98.9°F | Ht 69.5 in | Wt 196.0 lb

## 2024-11-24 DIAGNOSIS — Z6828 Body mass index (BMI) 28.0-28.9, adult: Secondary | ICD-10-CM | POA: Diagnosis not present

## 2024-11-24 DIAGNOSIS — Z96642 Presence of left artificial hip joint: Secondary | ICD-10-CM | POA: Diagnosis not present

## 2024-11-24 DIAGNOSIS — E782 Mixed hyperlipidemia: Secondary | ICD-10-CM | POA: Diagnosis not present

## 2024-11-24 DIAGNOSIS — E88819 Insulin resistance, unspecified: Secondary | ICD-10-CM

## 2024-11-24 DIAGNOSIS — E669 Obesity, unspecified: Secondary | ICD-10-CM

## 2024-11-24 DIAGNOSIS — Z6829 Body mass index (BMI) 29.0-29.9, adult: Secondary | ICD-10-CM

## 2024-11-24 MED ORDER — METFORMIN HCL ER 500 MG PO TB24: 500.0000 mg | ORAL_TABLET | Freq: Every day | ORAL | 0 refills | Status: AC

## 2024-11-24 NOTE — Progress Notes (Signed)
 "    WEIGHT SUMMARY AND BIOMETRICS  Vitals Temp: 98.9 F (37.2 C) BP: 103/66 Pulse Rate: 78 SpO2: 97 %   Anthropometric Measurements Height: 5' 9.5 (1.765 m) Weight: 196 lb (88.9 kg) BMI (Calculated): 28.54 Weight at Last Visit: 203 lb Weight Lost Since Last Visit: 7 lb Weight Gained Since Last Visit: 0 Starting Weight: 219 lb Total Weight Loss (lbs): 23 lb (10.4 kg) Peak Weight: 325 lb   Body Composition  Body Fat %: 39.8 % Fat Mass (lbs): 78 lbs Muscle Mass (lbs): 112 lbs Total Body Water (lbs): 78.6 lbs Visceral Fat Rating : 9   Other Clinical Data Fasting: no Labs: no Today's Visit #: 13 Starting Date: 07/14/23    Chief Complaint:   OBESITY Kathleen Everett is here to discuss her progress with her obesity treatment plan.  She is on the keeping a food journal and adhering to recommended goals of 1100-1300 calories and 85g+ protein and states she is following her eating plan approximately UNSURE % of the time.  She states she is exercising: None  Interim History:  Last OV at [facility] was on 09/07/2024  She has been tracking intake, prescribed 1100-1300 calories with at least 85g protein  She will take out her dog 5-6 times daily, walk the distance of the driveway or play toss with a ball.  Due to chronic pain, exercise is limited.  Discussed activity alternative, ie: aquatic therapy  She stopped Metformin  500mg  BID with meals approximately 2-2.5 weeks due to worsening constipation.  Subjective:   1. Insulin  resistance She stopped Metformin  500mg  BID with meals approximately 2-2.5 weeks due to worsening constipation.  She reports normal BMs the last 5 days She denies abdominal pain or hematochezia She has hx of chronic narcotic use for pain control Discussed changing from plain Metformin  to XR formulation for less GI upset  2. 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 female who was admitted 05/13/2024 with a diagnosis of S/P total left hip arthroplasty and went to the operating room on 05/13/2024 and underwent the above named procedures.    Surgeries: Procedure(s): ARTHROPLASTY, HIP, TOTAL, ANTERIOR APPROACH on 05/13/2024 Patient tolerated the surgery well. Taken to PACU where she was stabilized and then transferred to the orthopedic floor.   Started on Lovenox  40 mg q 24 hrs. TEDs and SCDs applied bilaterally. Heels elevated on bed. No evidence of DVT. Negative Homan. Physical therapy started on day #1 for gait training and transfer. OT started day #1 for ADL and assisted devices.   Patient's IV was d/c on day #1. Patient was able to safely and independently complete all PT goals. PT recommending discharge to home.     On post op day #1 patient was stable and ready for discharge to home with HHPT.  4. Mixed hyperlipidemia Lipid Panel     Component Value Date/Time   CHOL 183 02/17/2024 0905   CHOL 235 (H) 03/19/2012 0445   TRIG 72 02/17/2024 0905   TRIG 258 (H) 03/19/2012 0445   HDL 57 02/17/2024 0905   HDL 35 (L) 03/19/2012 0445   CHOLHDL 4 10/31/2023 0856   VLDL 15.8 10/31/2023 0856   VLDL 52 (H) 03/19/2012 0445   LDLCALC 113 (H) 02/17/2024 0905   LDLCALC 148 (H) 03/19/2012 0445   LDLDIRECT 181.0  06/26/2020 0855   LABVLDL 13 02/17/2024 0905    Assessment/Plan:   1. Insulin  resistance (Primary) Remain off Metformin  500mg  BID Start - metFORMIN  (GLUCOPHAGE -XR) 500 MG 24 hr tablet; Take 1 tablet (500 mg total) by mouth daily with breakfast.  Dispense: 60 tablet; Refill: 0  2. S/P L THR Advance activity per Orthopedic Care Team  3. Mixed hyperlipidemia Limit Sat Fat Advance activity per Orthopedic Care Team  4. BMI 29.0-29.9,adult Current BMI 28.5  Kathleen Everett is currently in the action stage of change. As such, her  goal is to continue with weight loss efforts. She has agreed to keeping a food journal and adhering to recommended goals of 1100-1300 calories and 85g+ protein.   Exercise goals: All adults should avoid inactivity. Some physical activity is better than none, and adults who participate in any amount of physical activity gain some health benefits. Adults should also include muscle-strengthening activities that involve all major muscle groups on 2 or more days a week.  Behavioral modification strategies: increasing lean protein intake, decreasing simple carbohydrates, increasing vegetables, increasing water intake, no skipping meals, meal planning and cooking strategies, keeping healthy foods in the home, ways to avoid boredom eating, better snacking choices, avoiding temptations, planning for success, and decreasing junk food.  Kathleen Everett has agreed to follow-up with our clinic in 4 weeks. She was informed of the importance of frequent follow-up visits to maximize her success with intensive lifestyle modifications for her multiple health conditions.   Objective:   Blood pressure 103/66, pulse 78, temperature 98.9 F (37.2 C), height 5' 9.5 (1.765 m), weight 196 lb (88.9 kg), last menstrual period 12/08/2006, SpO2 97%. Body mass index is 28.53 kg/m.  General: Cooperative, alert, well developed, in no acute distress. HEENT: Conjunctivae and lids unremarkable. Cardiovascular: Regular rhythm.  Lungs: Normal work of breathing. Neurologic: No focal deficits.   Lab Results  Component Value Date   CREATININE 0.78 06/08/2024   BUN 19 06/08/2024   NA 140 06/08/2024   K 4.3 06/08/2024   CL 103 06/08/2024   CO2 31 06/08/2024   Lab Results  Component Value Date   ALT 12 06/08/2024   AST 12 06/08/2024   ALKPHOS 94 06/08/2024   BILITOT 0.3 06/08/2024   Lab Results  Component Value Date   HGBA1C 5.0 02/17/2024   HGBA1C 5.1 07/14/2023   HGBA1C 5.3 10/22/2022   HGBA1C 5.4 07/19/2022   HGBA1C 5.6  03/06/2022   Lab Results  Component Value Date   INSULIN  14.1 02/17/2024   INSULIN  14.5 07/14/2023   Lab Results  Component Value Date   TSH 2.630 07/14/2023   Lab Results  Component Value Date   CHOL 183 02/17/2024   HDL 57 02/17/2024   LDLCALC 113 (H) 02/17/2024   LDLDIRECT 181.0 06/26/2020   TRIG 72 02/17/2024   CHOLHDL 4 10/31/2023   Lab Results  Component Value Date   VD25OH 33.7 07/14/2023   VD25OH 23.27 (L) 06/18/2018   VD25OH 28.42 (L) 08/01/2016   Lab Results  Component Value Date   WBC 7.0 06/08/2024   HGB 12.6 06/08/2024   HCT 37.0 06/08/2024   MCV 89.0 06/08/2024   PLT 255.0 06/08/2024   Lab Results  Component Value Date   IRON 63 09/11/2023   TIBC 324.8 09/11/2023   FERRITIN 99.1 09/11/2023    Attestation Statements:   Reviewed by clinician on day of visit: allergies, medications, problem list, medical history, surgical history, family history, social history, and previous encounter notes.  I  have reviewed the above documentation for accuracy and completeness, and I agree with the above. -  Ario Mcdiarmid d. [place] Festa, NP-C "
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
## 2024-11-29 ENCOUNTER — Ambulatory Visit (INDEPENDENT_AMBULATORY_CARE_PROVIDER_SITE_OTHER): Admitting: Licensed Clinical Social Worker

## 2024-11-29 DIAGNOSIS — F3341 Major depressive disorder, recurrent, in partial remission: Secondary | ICD-10-CM

## 2024-11-29 DIAGNOSIS — F411 Generalized anxiety disorder: Secondary | ICD-10-CM | POA: Diagnosis not present

## 2024-11-29 NOTE — Progress Notes (Signed)
 "  THERAPIST PROGRESS NOTE  Virtual Visit via Video Note  I connected with Kathleen Everett on 11/29/2024 at  9:00 AM EST by a video enabled telemedicine application and verified that I am speaking with the correct person using two identifiers.  Location: Patient: Address on file  Provider: Providers address   I discussed the limitations of evaluation and management by telemedicine and the availability of in person appointments. The patient expressed understanding and agreed to proceed.  I discussed the assessment and treatment plan with the patient. The patient was provided an opportunity to ask questions and all were answered. The patient agreed with the plan and demonstrated an understanding of the instructions.   The patient was advised to call back or seek an in-person evaluation if the symptoms worsen or if the condition fails to improve as anticipated.  I provided 53 minutes of non-face-to-face time during this encounter.   Kathleen KATHEE Husband, Kathleen Everett   Session Time: 9-9:53am  Participation Level: Active   Behavioral Response: CasualAlertNegative and tearful   Type of Therapy: Individual Therapy  Treatment Goals addressed:  Active     Anxiety     LTG: Kathleen Everett will score less than 5 on the Generalized Anxiety Disorder 7 Scale (GAD-7)  (Not Progressing)     Start:  02/06/24    Expected End:  03/16/25         STG: Kathleen Everett will reduce frequency of avoidant behaviors by 50% as evidenced by self-report in therapy sessions (Progressing)     Start:  02/06/24    Expected End:  03/16/25       Goal Note     11/15/24: Patient reports she is willing and open to having assertive conversations with those around her to address situations that make her anxious in an effort to come to a mutual conclusion.  However, patient continues to identify task paralysis related to completion of tasks around the home due to feeling overwhelmed.         Perform  psychoeducation regarding anxiety disorders     Start:  02/06/24         Work with patient individually to identify the major components of a recent episode of anxiety: physical symptoms, major thoughts and images, and major behaviors they experienced     Start:  02/06/24         Coping Skills      Start:  02/06/24       Will work with the pt using CBT/DBT techniques to help the pt verbalize an understanding of the cognitive, physiological, and behavioral components of anxiety and its treatment. This will be done by using worksheets, interactive activities, CBT/ABC thought logs, modeling, homework, role playing and journaling. Will work with pt to learn and implement coping skills that result in a reduction of anxiety and improve daily functioning per pt self-report 3 out of 5 documented sessions.         OP Depression     LTG: Reduce frequency, intensity, and duration of depression symptoms so that daily functioning is improved (Not Progressing)     Start:  02/06/24    Expected End:  03/16/25       Goal Note     11/15/24: Patient identified stressors within the home are interfering with her ability to improve depressive symptoms.  Patient also identifies changes to medication regimen and negative side effects from medications interfering with medication compliance could be contributing to worsening depressive symptoms.  LTG: Increase coping skills to manage depression and improve ability to perform daily activities (Not Progressing)     Start:  02/06/24    Expected End:  03/16/25       Goal Note     11/15/24: Patient reports improvements with medication compliance based on solutions established in previous therapeutic sessions.  However, patient continues to notate some task paralysis with common household tasks despite solutions identified in previous sessions.         LTG: Improve coping abilities with her chronic pain.  (Not Progressing)     Start:  02/06/24     Expected End:  03/16/25         STG: Not to get so agitated  (Not Progressing)     Start:  02/06/24    Expected End:  03/16/25      AEB: patient will learn three triggers for her irritability and work with the clinician on learning coping strategies such as breathing techniques and thought reframing exercises.         Work with Kathleen Everett to identify the major components of a recent episode of depression: physical symptoms, major thoughts and images, and major behaviors they experienced     Start:  02/06/24         Therapist will educate patient on cognitive distortions and the rationale for treatment of depression     Start:  02/06/24         Kathleen Everett will identify 2 cognitive distortions they are currently using and write reframing statements to replace them     Start:  02/06/24         Coping Skills      Start:  02/06/24       Will work with the pt using CBT/DBT techniques to help the pt verbalize an understanding of the cognitive, physiological, and behavioral components of depression and its treatment. This will be done by using worksheets, interactive activities, CBT/ABC thought logs, modeling, homework, role playing and journaling. Will work with pt to learn and implement coping skills that result in a reduction of depression and improve daily functioning per pt self-report 3 out of 5 documented sessions.          ProgressTowards Goals: Progressing   Interventions: Solution Focused, Assertiveness Training, and Supportive   Summary:  Kathleen Everett is a 54 y.o. female who presents with Change in energy/activity; Difficulty Concentrating; Fatigue; Irritability; Sleep (too much or little); Tearfulness; Worthlessness; anxious feelings, and tension. Pt was oriented times 5. Pt was cooperative and engaged. Pt denies SI/HI/AVH.   The patient reflected on a recent conversation about expenses and financial goals. She expressed feelings related to  her current financial state and the anxiety surrounding her finances. However, she feels powerless regarding controllable factors. The patient also reflected on her contributions to household finances, leading her to feel that she does not do enough.  Additionally, the patient acknowledged feelings of laziness and guilt regarding her daily task completion. She set a goal of performing daily devotionals and explored action steps she could take to begin this process. The patient reported that her first step would be to limit her phone usage, especially right after waking up. 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 We discussed mindful practices, allowing her to read her devotional two times a week.   Signed TP due to meeting being virtual.  Suicidal/Homicidal Risk: None; without intent or plan.  Therapist Response: The clinician utilized active and supportive reflection to help the patient feel safe while processing her thoughts. The clinician assessed the patient's current safety, symptoms, and stressors since the last session. Cln had to continuously redirect the patient to focus on one goal, as the patient often listed multiple tasks she felt she needed to complete. We explored action steps and barriers to achieving her goal.    Plan: Return again in 2 weeks.   Diagnosis: Generalized anxiety disorder   MDD (major depressive disorder), recurrent, in partial remission     Collaboration of Care: [facility] psychiatrist can access notes and cln. Will review psychiatrists' notes. Check in with the patient and will see Kathleen Everett per availability.  Patient/Guardian was advised Release of Information must be obtained prior to any record release in order to collaborate their care with an outside provider. Patient/Guardian was advised if they have not already done so to contact the registration department to sign all necessary  forms in order for us  to release information regarding their care.   Consent: Patient/Guardian gives verbal consent for treatment and assignment of benefits for services provided during this visit. Patient/Guardian expressed understanding and agreed to proceed.   Kathleen KATHEE Husband, Kathleen Everett 11/29/2024  "

## 2024-12-01 ENCOUNTER — Encounter: Payer: Self-pay | Admitting: Psychiatry

## 2024-12-01 ENCOUNTER — Telehealth (INDEPENDENT_AMBULATORY_CARE_PROVIDER_SITE_OTHER): Admitting: Psychiatry

## 2024-12-01 DIAGNOSIS — F330 Major depressive disorder, recurrent, mild: Secondary | ICD-10-CM

## 2024-12-01 DIAGNOSIS — F411 Generalized anxiety disorder: Secondary | ICD-10-CM | POA: Diagnosis not present

## 2024-12-01 MED ORDER — VENLAFAXINE HCL ER 37.5 MG PO CP24: 37.5000 mg | ORAL_CAPSULE | Freq: Every day | ORAL | 0 refills | Status: AC

## 2024-12-01 MED ORDER — VENLAFAXINE HCL ER 75 MG PO CP24: 75.0000 mg | ORAL_CAPSULE | Freq: Every day | ORAL | 1 refills | Status: AC

## 2024-12-14 ENCOUNTER — Encounter: Admitting: Internal Medicine

## 2024-12-14 ENCOUNTER — Telehealth: Payer: Self-pay

## 2024-12-14 NOTE — Telephone Encounter (Signed)
 Noted.

## 2024-12-14 NOTE — Telephone Encounter (Signed)
 Copied from CRM #[id number]. Topic: Appointments - Scheduling Inquiry for Clinic >> Dec 13, 2024 11:55 AM Rea BROCKS wrote: Reason for CRM: Pt called in and cancelled Physical appt tomorrow 01/27 due to weather. Appt for Dr. Glendia are showing first available end of March. Patient asked if she can be scheduled with [name]  or if she can get an appointment with Dr. Glendia sooner?    [phone] (M)  Error - this message should not have been attached to previous message.

## 2024-12-14 NOTE — Telephone Encounter (Signed)
 Copied from CRM #[id number]. Topic: Appointments - Scheduling Inquiry for Clinic >> Dec 13, 2024 11:55 AM Kathleen Everett wrote: Reason for CRM: Pt called in and cancelled Physical appt tomorrow 01/27 due to weather. Appt for Dr. Glendia are showing first available end of March. Patient asked if she can be scheduled with [name]  or if she can get an appointment with Dr. Glendia sooner?    [phone] (M)  I spoke with patient and scheduled an appointment for her with Dr. Allena Everett on 12/15/2024.

## 2024-12-15 ENCOUNTER — Ambulatory Visit: Payer: Self-pay | Admitting: Internal Medicine

## 2024-12-15 ENCOUNTER — Ambulatory Visit
Admission: RE | Admit: 2024-12-15 | Discharge: 2024-12-15 | Disposition: A | Source: Ambulatory Visit | Attending: Internal Medicine | Admitting: Internal Medicine

## 2024-12-15 ENCOUNTER — Ambulatory Visit
Admission: RE | Admit: 2024-12-15 | Discharge: 2024-12-15 | Disposition: A | Discharge status: Home / Self Care | Attending: Internal Medicine | Admitting: Internal Medicine

## 2024-12-15 ENCOUNTER — Ambulatory Visit: Admitting: Internal Medicine

## 2024-12-15 ENCOUNTER — Encounter: Payer: Self-pay | Admitting: Internal Medicine

## 2024-12-15 VITALS — BP 128/80 | HR 71 | Temp 98.9°F | Ht 70.0 in | Wt 202.6 lb

## 2024-12-15 DIAGNOSIS — K219 Gastro-esophageal reflux disease without esophagitis: Secondary | ICD-10-CM

## 2024-12-15 DIAGNOSIS — K5909 Other constipation: Secondary | ICD-10-CM

## 2024-12-15 DIAGNOSIS — F411 Generalized anxiety disorder: Secondary | ICD-10-CM

## 2024-12-15 DIAGNOSIS — R21 Rash and other nonspecific skin eruption: Secondary | ICD-10-CM | POA: Diagnosis not present

## 2024-12-15 DIAGNOSIS — G43809 Other migraine, not intractable, without status migrainosus: Secondary | ICD-10-CM | POA: Diagnosis not present

## 2024-12-15 DIAGNOSIS — K5900 Constipation, unspecified: Secondary | ICD-10-CM

## 2024-12-15 DIAGNOSIS — E7800 Pure hypercholesterolemia, unspecified: Secondary | ICD-10-CM | POA: Diagnosis not present

## 2024-12-15 DIAGNOSIS — M545 Low back pain, unspecified: Secondary | ICD-10-CM | POA: Diagnosis not present

## 2024-12-15 DIAGNOSIS — R6889 Other general symptoms and signs: Secondary | ICD-10-CM | POA: Diagnosis not present

## 2024-12-15 DIAGNOSIS — Q0700 Arnold-Chiari syndrome without spina bifida or hydrocephalus: Secondary | ICD-10-CM | POA: Diagnosis not present

## 2024-12-15 DIAGNOSIS — G8929 Other chronic pain: Secondary | ICD-10-CM | POA: Diagnosis not present

## 2024-12-15 DIAGNOSIS — Z0000 Encounter for general adult medical examination without abnormal findings: Secondary | ICD-10-CM | POA: Diagnosis not present

## 2024-12-15 DIAGNOSIS — F325 Major depressive disorder, single episode, in full remission: Secondary | ICD-10-CM | POA: Diagnosis not present

## 2024-12-15 DIAGNOSIS — N903 Dysplasia of vulva, unspecified: Secondary | ICD-10-CM

## 2024-12-15 DIAGNOSIS — R739 Hyperglycemia, unspecified: Secondary | ICD-10-CM

## 2024-12-15 LAB — CBC WITH DIFFERENTIAL/PLATELET
Basophils Absolute: 0 10*3/uL (ref 0.0–0.1)
Basophils Relative: 0.4 % (ref 0.0–3.0)
Eosinophils Absolute: 0.1 10*3/uL (ref 0.0–0.7)
Eosinophils Relative: 2.9 % (ref 0.0–5.0)
HCT: 39.3 % (ref 36.0–46.0)
Hemoglobin: 13.1 g/dL (ref 12.0–15.0)
Lymphocytes Relative: 29.7 % (ref 12.0–46.0)
Lymphs Abs: 1.3 10*3/uL (ref 0.7–4.0)
MCHC: 33.4 g/dL (ref 30.0–36.0)
MCV: 87.9 fl (ref 78.0–100.0)
Monocytes Absolute: 0.2 10*3/uL (ref 0.1–1.0)
Monocytes Relative: 5.8 % (ref 3.0–12.0)
Neutro Abs: 2.6 10*3/uL (ref 1.4–7.7)
Neutrophils Relative %: 61.2 % (ref 43.0–77.0)
Platelets: 205 10*3/uL (ref 150.0–400.0)
RBC: 4.47 Mil/uL (ref 3.87–5.11)
RDW: 13.5 % (ref 11.5–15.5)
WBC: 4.3 10*3/uL (ref 4.0–10.5)

## 2024-12-15 LAB — HEPATIC FUNCTION PANEL
ALT: 9 U/L (ref 3–35)
AST: 12 U/L (ref 5–37)
Albumin: 4.5 g/dL (ref 3.5–5.2)
Alkaline Phosphatase: 78 U/L (ref 39–117)
Bilirubin, Direct: 0.1 mg/dL (ref 0.1–0.3)
Total Bilirubin: 0.3 mg/dL (ref 0.2–1.2)
Total Protein: 6.7 g/dL (ref 6.0–8.3)

## 2024-12-15 LAB — BASIC METABOLIC PANEL WITH GFR
BUN: 15 mg/dL (ref 6–23)
CO2: 32 meq/L (ref 19–32)
Calcium: 9.5 mg/dL (ref 8.4–10.5)
Chloride: 104 meq/L (ref 96–112)
Creatinine, Ser: 0.68 mg/dL (ref 0.40–1.20)
GFR: 100.8 mL/min
Glucose, Bld: 77 mg/dL (ref 70–99)
Potassium: 4.2 meq/L (ref 3.5–5.1)
Sodium: 139 meq/L (ref 135–145)

## 2024-12-15 LAB — LIPID PANEL
Cholesterol: 201 mg/dL — ABNORMAL HIGH (ref 28–200)
HDL: 46.7 mg/dL
LDL Cholesterol: 123 mg/dL — ABNORMAL HIGH (ref 10–99)
NonHDL: 154.46
Total CHOL/HDL Ratio: 4
Triglycerides: 158 mg/dL — ABNORMAL HIGH (ref 10.0–149.0)
VLDL: 31.6 mg/dL (ref 0.0–40.0)

## 2024-12-15 MED ORDER — ATORVASTATIN CALCIUM 20 MG PO TABS: 20.0000 mg | ORAL_TABLET | Freq: Every day | ORAL | 1 refills | Status: AC

## 2024-12-15 NOTE — Progress Notes (Signed)
 "  Subjective:    Patient ID: Kathleen Everett, female    DOB: 1970/12/14, 54 y.o.   MRN: [id number]  Patient here for  Chief Complaint  Patient presents with   Annual Exam    HPI Here for a physical exam. Being followed by healthy weight and wellness. Stopped metformin  due to intolerance (insulin  resistance). Had f/u 11/24/24 - recommended to start back on metformin  XR. Enema - two days ago. Bowel movement after. Still with constipation. Some previous nausea and stomach pain. S/p left anterior total replacement (hip) 05/13/24. Had f/u 10/06/24 - ortho. Saw Dr Elby 09/15/24- vulvar irritation. Recommended estrace  cream. Recommended f/u in 6 months with possible repeat biopsies or colposcopy. Has noticed rash - abdomen. Itching - back. Has been using clobetasol . Had myelogram - last week. Off zoloft . Taking wellbutrin .    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   Endometriosis    Generalized anxiety disorder 05/01/2015   Has previously seen psychiatry. Having some issues with what she feels is related to stress and anxiety   GERD (gastroesophageal reflux disease)    Hemorrhoids 12/23/2020   Hidradenitis    High blood pressure    History of ovarian cyst    Hot flashes 03/12/2015   Hypercholesteremia 01/10/2014   Off lipitor. Elevated cholesterol. Labs discussed. Restart  lipitor. Follow lipid panel and liver function tests.   Hyperglycemia 06/24/2019   IBS (irritable bowel syndrome)    Joint pain    Major depressive disorder 05/01/2015   Migraine headaches 01/10/2014   Has seen neurology. Topamax .   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 Level is 21.6   Vulvar atrophy    Past Surgical History:  Procedure Laterality Date   ABDOMINAL HYSTERECTOMY  03/27/07   laparoscopic   BREAST EXCISIONAL BIOPSY Left 2004   fibroadenoma   CESAREAN SECTION     1999/2004 twins   chiari decompression     COLONOSCOPY WITH PROPOFOL  N/A 04/28/2015   Procedure: COLONOSCOPY WITH PROPOFOL ;  Surgeon: Lamar ONEIDA Holmes, MD;  Location: [facility] ENDOSCOPY;  Service: Endoscopy;  Laterality: N/A;  COLONOSCOPY WITH PROPOFOL  N/A 03/19/2021   Procedure: COLONOSCOPY WITH PROPOFOL ;  Surgeon: Maryruth Ole DASEN, MD;  Location: [facility] ENDOSCOPY;  Service: Endoscopy;  Laterality: N/A;   COLPOSCOPY N/A 04/24/2022   Procedure: COLPOSCOPY, ABLATION OF VULVAR TISSUE;  Surgeon: Elby Webb Loges, MD;  Location: [facility] ORS;  Service: Gynecology;  Laterality: N/A;   CYSTOSCOPY     bladder ulcers   ESOPHAGOGASTRODUODENOSCOPY N/A 04/28/2015   Procedure: ESOPHAGOGASTRODUODENOSCOPY (EGD);  Surgeon: Lamar DASEN Holmes, MD;  Location: [facility] ENDOSCOPY;  Service: Endoscopy;  Laterality: N/A;   ESOPHAGOGASTRODUODENOSCOPY (EGD)  WITH PROPOFOL  N/A 03/19/2021   Procedure: ESOPHAGOGASTRODUODENOSCOPY (EGD) WITH PROPOFOL ;  Surgeon: Maryruth Ole DASEN, MD;  Location: [facility] ENDOSCOPY;  Service: Endoscopy;  Laterality: N/A;   HERNIA REPAIR Right 2003   inguinal   INCISION AND DRAINAGE HIP Right 05/11/2020   Procedure: IRRIGATION AND DEBRIDEMENT HIP;  Surgeon: Kathlynn Sharper, MD;  Location: [facility] ORS;  Service: Orthopedics;  Laterality: Right;   OVARIAN CYST REMOVAL  2001   right   TEMPOROMANDIBULAR JOINT SURGERY  2000   TOTAL HIP ARTHROPLASTY Right 12/28/2019   Procedure: TOTAL HIP ARTHROPLASTY ANTERIOR APPROACH;  Surgeon: Kathlynn Sharper, MD;  Location: [facility] ORS;  Service: Orthopedics;  Laterality: Right;   TOTAL HIP ARTHROPLASTY Left 05/13/2024   Procedure: ARTHROPLASTY, HIP, TOTAL, ANTERIOR APPROACH;  Surgeon: Lorelle Hussar, MD;  Location: [facility] ORS;  Service: Orthopedics;  Laterality: Left;   TUBAL LIGATION     Bilateral   VULVA /PERINEUM BIOPSY  04/24/2022   Procedure: VULVAR BIOPSY;  Surgeon: Elby Webb Loges, MD;  Location: [facility] ORS;  Service: Gynecology;;   Family History  Problem Relation Age of Onset   Hyperlipidemia Mother    Anxiety disorder Mother    Atrial fibrillation Mother    Hypertension Mother    Memory loss Mother    Anxiety disorder Father    Stroke Father    Hyperlipidemia Father    Hypertension Father    Congestive Heart Failure Father    Depression Father    Hyperlipidemia Brother    Colon cancer Paternal Aunt    Dementia Paternal Aunt    Dementia Maternal Grandmother    Cancer Maternal Grandmother        unk primary; d. late 52s   Dementia Paternal Grandmother    ADD / ADHD Daughter    Breast cancer Neg Hx    Social History   Socioeconomic History   Marital status: Married    Spouse name: Medford   Number of children: 3   Years of education: 13   Highest education level: Some college, no degree  Occupational History   Occupation: Disability  Tobacco Use   Smoking status: Never    Smokeless tobacco: Never  Vaping Use   Vaping status: Never Used  Substance and Sexual Activity   Alcohol  use: Not Currently    Alcohol /week: 0.0 standard drinks of alcohol    Drug use: No   Sexual activity: Yes    Birth control/protection: Surgical    Comment: Hysterectomy  Other Topics Concern   Not on file  Social History Narrative   Patient drinks 2-3 cups of caffeine daily.   Patient is right handed.       Social Hx:   Current living situation- [place]. Living with husband and 3 kids   Born and Raised- [place]raised by both parents.    Siblings- 1 brother who is 7 yrs older than her   Legal issues- none   Married for 24 yrs. Currently unemployed since  2011 when she hurt her back. HS grad and CNA and phlebotomy.    Social Drivers of Health   Tobacco Use: Low Risk (12/19/2024)   Patient History    Smoking Tobacco Use: Never    Smokeless Tobacco Use: Never    Passive Exposure: Not on file  Recent Concern: Tobacco Use - Medium Risk (10/06/2024)   Received from [facility]   Patient History    Smoking Tobacco Use: Former    Smokeless Tobacco Use: Never    Passive Exposure: Not on file  Financial Resource Strain: Low Risk  (10/06/2024)   Received from [facility]   Overall Financial Resource Strain (CARDIA)    Difficulty of Paying Living Expenses: Not hard at all  Food Insecurity: No Food Insecurity (10/06/2024)   Received from [facility]   Epic    Within the past 12 months, you worried that your food would run out before you got the money to buy more.: Never true    Within the past 12 months, the food you bought just didn't last and you didn't have money to get more.: Never true  Transportation Needs: No Transportation Needs (10/06/2024)   Received from [facility] - Transportation    In the past 12 months, has lack of transportation kept you from medical appointments or from getting  medications?: No    Lack of Transportation (Non-Medical): No  Physical Activity: Unknown (03/16/2024)   Exercise Vital Sign    Days of Exercise per Week: Not on file    Minutes of Exercise per Session: 0 min  Stress: No Stress Concern Present (03/16/2024)   [employer] - Occupational Stress Questionnaire    Feeling of Stress : Only a little  Social Connections: Moderately Integrated (03/16/2024)   Social Connection and Isolation Panel    Frequency of Communication with Friends and Family: More than three times a week    Frequency of Social Gatherings with Friends and Family: Once a week    Attends Religious Services: More than 4 times per year    Active Member of Clubs or Organizations: No    Attends Banker Meetings: Never    Marital Status: Married  Depression (PHQ2-9): High Risk (11/15/2024)   Depression (PHQ2-9)    PHQ-2 Score: 17  Alcohol  Screen: Low Risk (03/16/2024)   Alcohol  Screen    Last Alcohol  Screening Score (AUDIT): 0  Housing: Low Risk  (10/06/2024)   Received from [facility]   Epic    In the last 12 months, was there a time when you were not able to pay the mortgage or rent on time?: No    In the past 12 months, how many times have you moved where you were living?: 0    At any time in the past 12 months, were you homeless or living in a shelter (including now)?: No  Utilities: Not At Risk (10/06/2024)   Received from [facility]   Epic    In the past 12 months has the electric, gas, oil, or water company threatened to shut off services in your home?: No  Health Literacy: Inadequate Health Literacy (03/16/2024)   B1300 Health Literacy    Frequency of need for help with medical instructions: Sometimes     Review of Systems  Constitutional:  Negative for appetite change and unexpected weight change.  HENT:  Negative for congestion, sinus pressure  and sore throat.   Eyes:  Negative for pain  and visual disturbance.  Respiratory:  Negative for cough, chest tightness and shortness of breath.   Cardiovascular:  Negative for chest pain and palpitations.       No increased swelling.   Gastrointestinal:  Positive for constipation. Negative for diarrhea.       Abdominal discomfort as outlined.   Genitourinary:  Negative for difficulty urinating and dysuria.  Musculoskeletal:  Negative for back pain and joint swelling.  Skin:  Negative for color change.       Rash - abdomen.   Neurological:  Negative for dizziness and headaches.  Hematological:  Negative for adenopathy. Does not bruise/bleed easily.  Psychiatric/Behavioral:  Negative for agitation and dysphoric mood.        Objective:     BP 128/80   Pulse 71   Temp 98.9 F (37.2 C) (Oral)   Ht 5' 10 (1.778 m)   Wt 202 lb 9.6 oz (91.9 kg)   LMP 12/08/2006   BMI 29.07 kg/m  Wt Readings from Last 3 Encounters:  12/15/24 202 lb 9.6 oz (91.9 kg)  11/24/24 196 lb (88.9 kg)  09/15/24 209 lb 9.6 oz (95.1 kg)    Physical Exam Vitals reviewed.  Constitutional:      General: She is not in acute distress.    Appearance: Normal appearance. She is well-developed.  HENT:     Head: Normocephalic and atraumatic.     Right Ear: External ear normal.     Left Ear: External ear normal.     Mouth/Throat:     Pharynx: No oropharyngeal exudate or posterior oropharyngeal erythema.  Eyes:     General: No scleral icterus.       Right eye: No discharge.        Left eye: No discharge.     Conjunctiva/sclera: Conjunctivae normal.  Neck:     Thyroid : No thyromegaly.  Cardiovascular:     Rate and Rhythm: Normal rate and regular rhythm.  Pulmonary:     Effort: No tachypnea, accessory muscle usage or respiratory distress.     Breath sounds: Normal breath sounds. No decreased breath sounds or wheezing.  Chest:  Breasts:    Right: No inverted nipple, mass, nipple discharge or tenderness (no axillary adenopathy).     Left: No inverted  nipple, mass, nipple discharge or tenderness (no axilarry adenopathy).  Abdominal:     General: Bowel sounds are normal.     Palpations: Abdomen is soft.     Tenderness: There is no abdominal tenderness.  Musculoskeletal:        General: No swelling or tenderness.     Cervical back: Neck supple.  Lymphadenopathy:     Cervical: No cervical adenopathy.  Skin:    Findings: No erythema.  Neurological:     Mental Status: She is alert and oriented to person, place, and time.  Psychiatric:        Mood and Affect: Mood normal.        Behavior: Behavior normal.         Outpatient Encounter Medications as of 12/15/2024  Medication Sig   acetaminophen  (TYLENOL ) 500 MG tablet Take 2 tablets (1,000 mg total) by mouth every 8 (eight) hours.   AIMOVIG 140 MG/ML SOAJ Inject 140 mg into the skin every 28 (twenty-eight) days.   azelastine  (ASTELIN ) 0.1 % nasal spray Place 1 spray into both nostrils 2 (two) times daily. Use in each nostril as directed  buPROPion  (WELLBUTRIN  XL) 300 MG 24 hr tablet Take 1 tablet (300 mg total) by mouth daily.   clobetasol  cream (TEMOVATE ) 0.05 % Apply 1 Application topically 2 (two) times daily. Apply to hands until smooth, then stop   cyanocobalamin  (VITAMIN B12) 1000 MCG/ML injection INJECT 1 ML INTRAMUSCULARLY  ONCE EVERY MONTH   diclofenac Sodium (VOLTAREN) 1 % GEL Apply 2 g topically daily as needed (pain).   estradiol  (ESTRACE ) 0.01 % CREA vaginal cream Place 1 Applicatorful vaginally at bedtime. Use 2-3 times weekly.   HYDROcodone -acetaminophen  (NORCO) 10-325 MG tablet Take 1 tablet by mouth in the morning, at noon, in the evening, and at bedtime.   ibuprofen  (ADVIL ) 800 MG tablet Take 800 mg by mouth every 8 (eight) hours as needed.   metFORMIN  (GLUCOPHAGE -XR) 500 MG 24 hr tablet Take 1 tablet (500 mg total) by mouth daily with breakfast.   methocarbamol  (ROBAXIN ) 500 MG tablet Take 500 mg by mouth 4 (four) times daily.   ondansetron  (ZOFRAN ) 4 MG tablet  Take 1 tablet (4 mg total) by mouth every 8 (eight) hours as needed for nausea.   rizatriptan (MAXALT) 10 MG tablet Take 10 mg by mouth as needed for migraine. May repeat in 2 hours if needed   topiramate  (TOPAMAX ) 25 MG tablet Take 50 mg by mouth 2 (two) times daily.   Ubrogepant (UBRELVY) 100 MG TABS Take 100 mg by mouth 2 (two) times daily as needed (migraine).   venlafaxine  XR (EFFEXOR -XR) 37.5 MG 24 hr capsule Take 1 capsule (37.5 mg total) by mouth daily with breakfast.   venlafaxine  XR (EFFEXOR -XR) 75 MG 24 hr capsule Take 1 capsule (75 mg total) by mouth daily with breakfast. Start after completing 37.5 mg daily for one week   XTAMPZA  ER 13.5 MG C12A Take 13.5 mg by mouth 2 (two) times daily.   aspirin EC 81 MG tablet Take 162 mg by mouth daily. Swallow whole. (Patient not taking: Reported on 12/15/2024)   atorvastatin  (LIPITOR) 20 MG tablet Take 1 tablet (20 mg total) by mouth daily.   [DISCONTINUED] atorvastatin  (LIPITOR) 20 MG tablet Take 1 tablet (20 mg total) by mouth daily.   [DISCONTINUED] Crisaborole  (EUCRISA ) 2 % OINT Apply twice a day to hands prn flares (Patient not taking: Reported on 12/15/2024)   [DISCONTINUED] esomeprazole  (NEXIUM ) 40 MG capsule TAKE 1 CAPSULE BY MOUTH TWICE DAILY BEFORE A MEAL (Patient not taking: Reported on 12/15/2024)   No facility-administered encounter medications on file as of 12/15/2024.     Lab Results  Component Value Date   WBC 4.3 12/15/2024   HGB 13.1 12/15/2024   HCT 39.3 12/15/2024   PLT 205.0 12/15/2024   GLUCOSE 77 12/15/2024   CHOL 201 (H) 12/15/2024   TRIG 158.0 (H) 12/15/2024   HDL 46.70 12/15/2024   LDLDIRECT 181.0 06/26/2020   LDLCALC 123 (H) 12/15/2024   ALT 9 12/15/2024   AST 12 12/15/2024   NA 139 12/15/2024   K 4.2 12/15/2024   CL 104 12/15/2024   CREATININE 0.68 12/15/2024   BUN 15 12/15/2024   CO2 32 12/15/2024   TSH 2.13 12/15/2024   INR 1.1 12/03/2019   HGBA1C 5.0 02/17/2024    MM 3D SCREENING MAMMOGRAM  BILATERAL BREAST Result Date: 10/17/2024 CLINICAL DATA:  Screening. EXAM: DIGITAL SCREENING BILATERAL MAMMOGRAM WITH TOMOSYNTHESIS AND CAD TECHNIQUE: Bilateral screening digital craniocaudal and mediolateral oblique mammograms were obtained. Bilateral screening digital breast tomosynthesis was performed. The images were evaluated with computer-aided detection. COMPARISON:  Previous exam(s).  ACR Breast Density Category b: There are scattered areas of fibroglandular density. FINDINGS: There are no findings suspicious for malignancy. IMPRESSION: No mammographic evidence of malignancy. A result letter of this screening mammogram will be mailed directly to the patient. RECOMMENDATION: Screening mammogram in one year. (Code:SM-B-01Y) BI-RADS CATEGORY  1: Negative. Electronically Signed   By: Dina  Arceo M.D.   On: 10/17/2024 14:46       Assessment & Plan:  Routine general medical examination at a health care facility  Hypercholesterolemia Assessment & Plan: On atorvastatin . Low cholesterol diet and exercise. Follow lipid panel.   Orders: -     Hepatic function panel -     Basic metabolic panel with GFR -     Lipid panel -     TSH -     CBC with Differential/Platelet  Healthcare maintenance Assessment & Plan: Physical today 12/15/24.SABRA  Seeing oncology (as outlined) for gyn exams. Colonoscopy 03/2021 - internal hemorrhoids.  recomemnded f/u in 5 years.  Mammogram - 10/12/24 - Birads I.    Other constipation Assessment & Plan: Persistent problem with constipation. Was concerned metformin  aggravating. Discussed miralax regularly. Stay hydrated. Exercise as tolerated. Discussed current narcotic use. Follow.   Orders: -     CBC with Differential/Platelet -     Ferritin -     DG Abd 1 View; Future  Cold feeling -     Ferritin  Vulvar dysplasia Assessment & Plan: Saw Dr Elby 09/15/24- vulvar irritation. Recommended estrace  cream. Recommended f/u in 6 months with possible repeat biopsies or  colposcopy.   Rash Assessment & Plan: Unclear etiology. Antihistamine daily. Follow.  Call with update.    Other migraine without status migrainosus, not intractable Assessment & Plan: Seeing neurology for migraines - ubrelvy, aimovig and rizatriptan.   Stable.    Gastroesophageal reflux disease, unspecified whether esophagitis present Assessment & Plan: Continue nexium .    Generalized anxiety disorder Assessment & Plan: Being followed by psychiatry. Off zoloft . On wellbutrin . Overall stable. Follow.    Depression, major, in remission Assessment & Plan: Followed by psychiatry. Continues on wellbutrin . Stable. Off zoloft .    Chronic low back pain, unspecified back pain laterality, unspecified whether sciatica present Assessment & Plan: Followed by pain clinic. S/p removal of spinal cord stimulator. S/p myelogram last week. Continue f/u with pain clinic. .    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 Followed by neurology. Stable.    Other orders -     Atorvastatin  Calcium ; Take 1 tablet (20 mg total) by mouth daily.  Dispense: 90 tablet; Refill: 1     Allena Hamilton, MD "

## 2024-12-15 NOTE — Assessment & Plan Note (Signed)
 Physical today 12/15/24.Kathleen Everett  Seeing oncology (as outlined) for gyn exams. Colonoscopy 03/2021 - internal hemorrhoids.  recomemnded f/u in 5 years.  Mammogram - 10/12/24 - Birads I.

## 2024-12-16 LAB — FERRITIN: Ferritin: 41.3 ng/mL (ref 10.0–291.0)

## 2024-12-16 LAB — TSH: TSH: 2.13 u[IU]/mL (ref 0.35–5.50)

## 2024-12-16 NOTE — Telephone Encounter (Signed)
 Order placed for GI referral.

## 2024-12-17 ENCOUNTER — Ambulatory Visit (INDEPENDENT_AMBULATORY_CARE_PROVIDER_SITE_OTHER): Admitting: Licensed Clinical Social Worker

## 2024-12-17 DIAGNOSIS — F330 Major depressive disorder, recurrent, mild: Secondary | ICD-10-CM

## 2024-12-17 DIAGNOSIS — F411 Generalized anxiety disorder: Secondary | ICD-10-CM | POA: Diagnosis not present

## 2024-12-17 NOTE — Progress Notes (Signed)
 "  THERAPIST PROGRESS NOTE  Virtual Visit via Video Note  I connected with Kathleen Everett on 12/17/24 at  9:00 AM EST by a video enabled telemedicine application and verified that I am speaking with the correct person using two identifiers.  Location: Patient: Address on file Provider: Providers Address   I discussed the limitations of evaluation and management by telemedicine and the availability of in person appointments. The patient expressed understanding and agreed to proceed.   I discussed the assessment and treatment plan with the patient. The patient was provided an opportunity to ask questions and all were answered. The patient agreed with the plan and demonstrated an understanding of the instructions.   The patient was advised to call back or seek an in-person evaluation if the symptoms worsen or if the condition fails to improve as anticipated.  I provided 59 minutes of non-face-to-face time during this encounter.   Kathleen Everett Husband, LCSW   Session Time: 9-9:59am  Participation Level: Active   Behavioral Response: CasualAlertNegative and tearful   Type of Therapy: Individual Therapy  Treatment Goals addressed:  Active     Anxiety     LTG: Tressia Labrum will score less than 5 on the Generalized Anxiety Disorder 7 Scale (GAD-7)  (Not Progressing)     Start:  02/06/24    Expected End:  03/16/25         STG: [name] will reduce frequency of avoidant behaviors by 50% as evidenced by self-report in therapy sessions (Progressing)     Start:  02/06/24    Expected End:  03/16/25       Goal Note     11/15/24: Patient reports she is willing and open to having assertive conversations with those around her to address situations that make her anxious in an effort to come to a mutual conclusion.  However, patient continues to identify task paralysis related to completion of tasks around the home due to feeling overwhelmed.         Perform  psychoeducation regarding anxiety disorders     Start:  02/06/24         Work with patient individually to identify the major components of a recent episode of anxiety: physical symptoms, major thoughts and images, and major behaviors they experienced     Start:  02/06/24         Coping Skills      Start:  02/06/24       Will work with the pt using CBT/DBT techniques to help the pt verbalize an understanding of the cognitive, physiological, and behavioral components of anxiety and its treatment. This will be done by using worksheets, interactive activities, CBT/ABC thought logs, modeling, homework, role playing and journaling. Will work with pt to learn and implement coping skills that result in a reduction of anxiety and improve daily functioning per pt self-report 3 out of 5 documented sessions.         OP Depression     LTG: Reduce frequency, intensity, and duration of depression symptoms so that daily functioning is improved (Not Progressing)     Start:  02/06/24    Expected End:  03/16/25       Goal Note     11/15/24: Patient identified stressors within the home are interfering with her ability to improve depressive symptoms.  Patient also identifies changes to medication regimen and negative side effects from medications interfering with medication compliance could be contributing to worsening depressive symptoms.  LTG: Increase coping skills to manage depression and improve ability to perform daily activities (Not Progressing)     Start:  02/06/24    Expected End:  03/16/25       Goal Note     11/15/24: Patient reports improvements with medication compliance based on solutions established in previous therapeutic sessions.  However, patient continues to notate some task paralysis with common household tasks despite solutions identified in previous sessions.         LTG: Improve coping abilities with her chronic pain.  (Not Progressing)     Start:  02/06/24     Expected End:  03/16/25         STG: Not to get so agitated  (Not Progressing)     Start:  02/06/24    Expected End:  03/16/25      AEB: patient will learn three triggers for her irritability and work with the clinician on learning coping strategies such as breathing techniques and thought reframing exercises.         Work with [name] to identify the major components of a recent episode of depression: physical symptoms, major thoughts and images, and major behaviors they experienced     Start:  02/06/24         Therapist will educate patient on cognitive distortions and the rationale for treatment of depression     Start:  02/06/24         [name] will identify 2 cognitive distortions they are currently using and write reframing statements to replace them     Start:  02/06/24         Coping Skills      Start:  02/06/24       Will work with the pt using CBT/DBT techniques to help the pt verbalize an understanding of the cognitive, physiological, and behavioral components of depression and its treatment. This will be done by using worksheets, interactive activities, CBT/ABC thought logs, modeling, homework, role playing and journaling. Will work with pt to learn and implement coping skills that result in a reduction of depression and improve daily functioning per pt self-report 3 out of 5 documented sessions.        ProgressTowards Goals: Progressing  Interventions: Solution Focused, Assertiveness Training, and Supportive  Summary:  Kathleen Everett is a 54 y.o. female who presents with Change in energy/activity; Difficulty Concentrating; Fatigue; Irritability; Sleep (too much or little); Tearfulness; Worthlessness; anxious feelings, and tension. Pt was oriented times 5. Pt was cooperative and engaged. Pt denies SI/HI/AVH.    She shared how financial stressors have continued to impact her relationships and mental health, leaving her feeling  unsupported. She reflected on controllable factors and used assertive communication to clarify expectations within her marriage.  Based on education regarding the root of feeling lazy the patient reflected on deeper issues such as mental fatigue, fear of failure, or a nervous system that is stuck in freeze mode. She also reflected on ways she can manage her stressors and celebrated the creative efforts she has made to find motivation for completing chores.  Moving forward, the patient will listen to her favorite music while doing the dishes, reframe self talk around external validation for task completion, and reward herself when completion of a task.   Suicidal/Homicidal Risk: None; without intent or plan.  Therapist Response: The clinician utilized active and supportive reflection to help the patient feel safe while processing her thoughts. The clinician assessed the patient's current  safety, symptoms, and stressors since the last session. We explored education about the root causes of laziness. She examined ways to motivate herself, recognizing that what often appears to be laziness can stem from deeper issues such as mental fatigue, unhealed trauma, fear of failure, or a nervous system that is stuck in freeze mode.Identified action steps the patient can take to improve motivation/task completion.    Plan: Return again in 2 weeks.   Diagnosis: Generalized anxiety disorder   MDD (major depressive disorder), recurrent, in partial remission     Collaboration of Care: AEB psychiatrist can access notes and cln. Will review psychiatrists' notes. Check in with the patient and will see LCSW per availability.  Patient/Guardian was advised Release of Information must be obtained prior to any record release in order to collaborate their care with an outside provider. Patient/Guardian was advised if they have not already done so to contact the registration department to sign all necessary forms in  order for us  to release information regarding their care.   Consent: Patient/Guardian gives verbal consent for treatment and assignment of benefits for services provided during this visit. Patient/Guardian expressed understanding and agreed to proceed.   Kathleen Everett Husband, LCSW 12/17/2024  "

## 2024-12-19 ENCOUNTER — Encounter: Payer: Self-pay | Admitting: Internal Medicine

## 2024-12-19 NOTE — Assessment & Plan Note (Signed)
 On atorvastatin . Low cholesterol diet and exercise. Follow lipid panel.

## 2024-12-19 NOTE — Assessment & Plan Note (Signed)
 Seeing neurology for migraines - ubrelvy, aimovig and rizatriptan.   Stable.

## 2024-12-19 NOTE — Assessment & Plan Note (Signed)
 Followed by pain clinic. S/p removal of spinal cord stimulator. S/p myelogram last week. Continue f/u with pain clinic. SABRA

## 2024-12-19 NOTE — Assessment & Plan Note (Signed)
 S/p surgery. Followed by neurology. Stable.

## 2024-12-19 NOTE — Assessment & Plan Note (Signed)
 Unclear etiology. Antihistamine daily. Follow.  Call with update.

## 2024-12-19 NOTE — Assessment & Plan Note (Signed)
 Saw Dr Elby 09/15/24- vulvar irritation. Recommended estrace  cream. Recommended f/u in 6 months with possible repeat biopsies or colposcopy.

## 2024-12-19 NOTE — Assessment & Plan Note (Signed)
 Persistent problem with constipation. Was concerned metformin  aggravating. Discussed miralax regularly. Stay hydrated. Exercise as tolerated. Discussed current narcotic use. Follow.

## 2024-12-19 NOTE — Assessment & Plan Note (Signed)
 Continue nexium

## 2024-12-19 NOTE — Assessment & Plan Note (Signed)
 Being followed by psychiatry. Off zoloft . On wellbutrin . Overall stable. Follow.

## 2024-12-19 NOTE — Assessment & Plan Note (Signed)
 Followed by psychiatry. Continues on wellbutrin . Stable. Off zoloft .

## 2024-12-24 ENCOUNTER — Encounter: Payer: Self-pay | Admitting: Internal Medicine

## 2024-12-31 ENCOUNTER — Ambulatory Visit: Admitting: Licensed Clinical Social Worker

## 2025-01-05 ENCOUNTER — Telehealth: Admitting: Psychiatry

## 2025-01-14 ENCOUNTER — Ambulatory Visit: Admitting: Licensed Clinical Social Worker

## 2025-01-18 ENCOUNTER — Ambulatory Visit (INDEPENDENT_AMBULATORY_CARE_PROVIDER_SITE_OTHER): Admitting: Physician Assistant

## 2025-01-28 ENCOUNTER — Ambulatory Visit: Admitting: Licensed Clinical Social Worker

## 2025-03-15 ENCOUNTER — Ambulatory Visit: Admitting: Internal Medicine

## 2025-03-16 ENCOUNTER — Inpatient Hospital Stay

## 2025-03-22 ENCOUNTER — Ambulatory Visit

## 2025-10-18 ENCOUNTER — Ambulatory Visit: Admitting: Dermatology
